# Patient Record
Sex: Male | Born: 1985
Health system: Southern US, Community
[De-identification: ages and names within clinical notes are randomized; demographics above are authoritative.]

## PROBLEM LIST (undated history)

## (undated) DIAGNOSIS — G709 Myoneural disorder, unspecified: Secondary | ICD-10-CM

## (undated) DIAGNOSIS — E119 Type 2 diabetes mellitus without complications: Secondary | ICD-10-CM

## (undated) DIAGNOSIS — D649 Anemia, unspecified: Secondary | ICD-10-CM

## (undated) DIAGNOSIS — F319 Bipolar disorder, unspecified: Secondary | ICD-10-CM

## (undated) DIAGNOSIS — K219 Gastro-esophageal reflux disease without esophagitis: Secondary | ICD-10-CM

## (undated) DIAGNOSIS — I10 Essential (primary) hypertension: Secondary | ICD-10-CM

## (undated) DIAGNOSIS — J45909 Unspecified asthma, uncomplicated: Secondary | ICD-10-CM

## (undated) DIAGNOSIS — M1990 Unspecified osteoarthritis, unspecified site: Secondary | ICD-10-CM

## (undated) DIAGNOSIS — F329 Major depressive disorder, single episode, unspecified: Secondary | ICD-10-CM

## (undated) DIAGNOSIS — G839 Paralytic syndrome, unspecified: Secondary | ICD-10-CM

## (undated) DIAGNOSIS — N183 Chronic kidney disease, stage 3 unspecified: Secondary | ICD-10-CM

## (undated) DIAGNOSIS — R011 Cardiac murmur, unspecified: Secondary | ICD-10-CM

## (undated) DIAGNOSIS — F32A Depression, unspecified: Secondary | ICD-10-CM

## (undated) HISTORY — PX: RETINAL LASER PROCEDURE: SHX2339

## (undated) HISTORY — PX: TUMOR EXCISION: SHX421

## (undated) HISTORY — DX: Anemia, unspecified: D64.9

## (undated) HISTORY — PX: INGUINAL HERNIA REPAIR: SUR1180

## (undated) HISTORY — DX: Chronic kidney disease, stage 3 unspecified: N18.30

---

## 2014-06-15 ENCOUNTER — Ambulatory Visit: Payer: No Typology Code available for payment source | Attending: Chiropractic Medicine | Admitting: Physical Therapy

## 2014-06-15 ENCOUNTER — Encounter: Payer: Self-pay | Admitting: Physical Therapy

## 2014-06-15 DIAGNOSIS — M545 Low back pain, unspecified: Secondary | ICD-10-CM

## 2014-06-15 NOTE — Therapy (Addendum)
[address] [place], [place], [address] Phone: [phone]   Fax:  [phone]  Physical Therapy Evaluation  Patient Details  Name: [name] [name] MRN: [id number] Date of Birth: 1985/10/06 Referring Provider:  Reinaldo Meeker, DC  Encounter Date: 06/15/2014    History reviewed. No pertinent past medical history.  History reviewed. No pertinent past surgical history.  There were no vitals filed for this visit.  Visit Diagnosis:  Midline low back pain without sciatica - Plan: PT plan of care cert/re-cert                               PT Short Term Goals - 06/15/14 1449    PT SHORT TERM GOAL #1   Title independent with initial HEP   Time 2   Period Weeks   Status New           PT Long Term Goals - 06/15/14 1450    PT LONG TERM GOAL #1   Title understand and demonstrate proper posture and body mechanics   Time 8   Period Weeks   Status New   PT LONG TERM GOAL #2   Title increase ROM 50%   Time 8   Period Weeks   Status New   PT LONG TERM GOAL #3   Title decrease pain 50%   Time 8   Period Weeks   Status New   PT LONG TERM GOAL #4   Title sit without difficulty   Time 8   Period Weeks   Status New            PHYSICAL THERAPY DISCHARGE SUMMARY  Visits from Start of Care:1   Plan: Patient agrees to discharge.  Patient goals were not met. Patient is being discharged due to not returning since the last visit.  ?????         Problem List There are no active problems to display for this patient.   Sumner Boast, PT 10/27/2014, 8:14 AM  [address] [place], [place], [address] Phone: [phone]   Fax:  [phone]

## 2014-06-22 ENCOUNTER — Ambulatory Visit: Payer: No Typology Code available for payment source | Attending: Chiropractic Medicine | Admitting: Physical Therapy

## 2014-06-22 DIAGNOSIS — M545 Low back pain: Secondary | ICD-10-CM | POA: Insufficient documentation

## 2015-08-30 ENCOUNTER — Emergency Department (HOSPITAL_COMMUNITY): Payer: Self-pay

## 2015-08-30 ENCOUNTER — Inpatient Hospital Stay (HOSPITAL_COMMUNITY)
Admission: EM | Admit: 2015-08-30 | Discharge: 2015-09-11 | DRG: 571 | Disposition: A | Discharge status: Home Health | Payer: Self-pay | Attending: Internal Medicine | Admitting: Internal Medicine

## 2015-08-30 ENCOUNTER — Encounter (HOSPITAL_COMMUNITY): Payer: Self-pay | Admitting: *Deleted

## 2015-08-30 DIAGNOSIS — Z22322 Carrier or suspected carrier of Methicillin resistant Staphylococcus aureus: Secondary | ICD-10-CM

## 2015-08-30 DIAGNOSIS — Z590 Homelessness: Secondary | ICD-10-CM

## 2015-08-30 DIAGNOSIS — T383X6A Underdosing of insulin and oral hypoglycemic [antidiabetic] drugs, initial encounter: Secondary | ICD-10-CM | POA: Diagnosis present

## 2015-08-30 DIAGNOSIS — L0390 Cellulitis, unspecified: Secondary | ICD-10-CM | POA: Insufficient documentation

## 2015-08-30 DIAGNOSIS — L0293 Carbuncle, unspecified: Secondary | ICD-10-CM | POA: Diagnosis present

## 2015-08-30 DIAGNOSIS — L02419 Cutaneous abscess of limb, unspecified: Secondary | ICD-10-CM

## 2015-08-30 DIAGNOSIS — X58XXXA Exposure to other specified factors, initial encounter: Secondary | ICD-10-CM | POA: Diagnosis present

## 2015-08-30 DIAGNOSIS — Z91120 Patient's intentional underdosing of medication regimen due to financial hardship: Secondary | ICD-10-CM

## 2015-08-30 DIAGNOSIS — T368X5A Adverse effect of other systemic antibiotics, initial encounter: Secondary | ICD-10-CM | POA: Diagnosis not present

## 2015-08-30 DIAGNOSIS — E669 Obesity, unspecified: Secondary | ICD-10-CM | POA: Diagnosis present

## 2015-08-30 DIAGNOSIS — J45909 Unspecified asthma, uncomplicated: Secondary | ICD-10-CM | POA: Diagnosis present

## 2015-08-30 DIAGNOSIS — M609 Myositis, unspecified: Secondary | ICD-10-CM | POA: Diagnosis present

## 2015-08-30 DIAGNOSIS — L02213 Cutaneous abscess of chest wall: Secondary | ICD-10-CM | POA: Diagnosis present

## 2015-08-30 DIAGNOSIS — L02412 Cutaneous abscess of left axilla: Secondary | ICD-10-CM | POA: Diagnosis present

## 2015-08-30 DIAGNOSIS — B957 Other staphylococcus as the cause of diseases classified elsewhere: Secondary | ICD-10-CM | POA: Diagnosis present

## 2015-08-30 DIAGNOSIS — L0291 Cutaneous abscess, unspecified: Secondary | ICD-10-CM

## 2015-08-30 DIAGNOSIS — E869 Volume depletion, unspecified: Secondary | ICD-10-CM | POA: Diagnosis present

## 2015-08-30 DIAGNOSIS — M79631 Pain in right forearm: Secondary | ICD-10-CM | POA: Diagnosis present

## 2015-08-30 DIAGNOSIS — R609 Edema, unspecified: Secondary | ICD-10-CM

## 2015-08-30 DIAGNOSIS — IMO0002 Reserved for concepts with insufficient information to code with codable children: Secondary | ICD-10-CM | POA: Diagnosis present

## 2015-08-30 DIAGNOSIS — L03113 Cellulitis of right upper limb: Secondary | ICD-10-CM | POA: Diagnosis present

## 2015-08-30 DIAGNOSIS — L02413 Cutaneous abscess of right upper limb: Secondary | ICD-10-CM | POA: Diagnosis present

## 2015-08-30 DIAGNOSIS — I10 Essential (primary) hypertension: Secondary | ICD-10-CM | POA: Diagnosis present

## 2015-08-30 DIAGNOSIS — L02414 Cutaneous abscess of left upper limb: Principal | ICD-10-CM | POA: Diagnosis present

## 2015-08-30 DIAGNOSIS — E1165 Type 2 diabetes mellitus with hyperglycemia: Secondary | ICD-10-CM | POA: Diagnosis present

## 2015-08-30 DIAGNOSIS — B9562 Methicillin resistant Staphylococcus aureus infection as the cause of diseases classified elsewhere: Secondary | ICD-10-CM | POA: Diagnosis present

## 2015-08-30 DIAGNOSIS — D6489 Other specified anemias: Secondary | ICD-10-CM | POA: Diagnosis present

## 2015-08-30 DIAGNOSIS — R197 Diarrhea, unspecified: Secondary | ICD-10-CM | POA: Diagnosis not present

## 2015-08-30 DIAGNOSIS — T465X6A Underdosing of other antihypertensive drugs, initial encounter: Secondary | ICD-10-CM | POA: Diagnosis present

## 2015-08-30 DIAGNOSIS — D649 Anemia, unspecified: Secondary | ICD-10-CM | POA: Diagnosis present

## 2015-08-30 DIAGNOSIS — Z6835 Body mass index (BMI) 35.0-35.9, adult: Secondary | ICD-10-CM

## 2015-08-30 DIAGNOSIS — N179 Acute kidney failure, unspecified: Secondary | ICD-10-CM

## 2015-08-30 DIAGNOSIS — S5011XA Contusion of right forearm, initial encounter: Secondary | ICD-10-CM | POA: Diagnosis present

## 2015-08-30 DIAGNOSIS — M109 Gout, unspecified: Secondary | ICD-10-CM | POA: Diagnosis present

## 2015-08-30 DIAGNOSIS — E876 Hypokalemia: Secondary | ICD-10-CM | POA: Diagnosis present

## 2015-08-30 DIAGNOSIS — L0292 Furuncle, unspecified: Secondary | ICD-10-CM | POA: Diagnosis present

## 2015-08-30 DIAGNOSIS — I152 Hypertension secondary to endocrine disorders: Secondary | ICD-10-CM | POA: Diagnosis present

## 2015-08-30 HISTORY — DX: Essential (primary) hypertension: I10

## 2015-08-30 HISTORY — DX: Unspecified asthma, uncomplicated: J45.909

## 2015-08-30 LAB — COMPREHENSIVE METABOLIC PANEL
ALT: 18 U/L (ref 17–63)
ANION GAP: 9 (ref 5–15)
AST: 12 U/L — ABNORMAL LOW (ref 15–41)
Albumin: 3.8 g/dL (ref 3.5–5.0)
Alkaline Phosphatase: 95 U/L (ref 38–126)
BUN: 9 mg/dL (ref 6–20)
CALCIUM: 9.3 mg/dL (ref 8.9–10.3)
CHLORIDE: 93 mmol/L — AB (ref 101–111)
CO2: 29 mmol/L (ref 22–32)
Creatinine, Ser: 0.81 mg/dL (ref 0.61–1.24)
GFR calc non Af Amer: >60 mL/min (ref >60)
Glucose, Bld: 420 mg/dL — ABNORMAL HIGH (ref 65–99)
Potassium: 3.9 mmol/L (ref 3.5–5.1)
SODIUM: 131 mmol/L — AB (ref 135–145)
Total Bilirubin: 0.7 mg/dL (ref 0.3–1.2)
Total Protein: 7.7 g/dL (ref 6.5–8.1)

## 2015-08-30 LAB — CBC WITH DIFFERENTIAL/PLATELET
Basophils Absolute: 0 10*3/uL (ref 0.0–0.1)
Basophils Relative: 0 %
EOS ABS: 0.1 10*3/uL (ref 0.0–0.7)
EOS PCT: 1 %
HCT: 40.5 % (ref 39.0–52.0)
Hemoglobin: 14.8 g/dL (ref 13.0–17.0)
LYMPHS ABS: 2.2 10*3/uL (ref 0.7–4.0)
Lymphocytes Relative: 13 %
MCH: 30 pg (ref 26.0–34.0)
MCHC: 36.5 g/dL — AB (ref 30.0–36.0)
MCV: 82 fL (ref 78.0–100.0)
MONO ABS: 1.2 10*3/uL — AB (ref 0.1–1.0)
MONOS PCT: 7 %
Neutro Abs: 13.4 10*3/uL — ABNORMAL HIGH (ref 1.7–7.7)
Neutrophils Relative %: 79 %
PLATELETS: 273 10*3/uL (ref 150–400)
RBC: 4.94 MIL/uL (ref 4.22–5.81)
RDW: 12.2 % (ref 11.5–15.5)
WBC: 16.9 10*3/uL — AB (ref 4.0–10.5)

## 2015-08-30 LAB — I-STAT CG4 LACTIC ACID, ED: LACTIC ACID, VENOUS: 1.37 mmol/L (ref 0.5–2.0)

## 2015-08-30 LAB — CBG MONITORING, ED: Glucose-Capillary: 423 mg/dL — ABNORMAL HIGH (ref 65–99)

## 2015-08-30 MED ORDER — SODIUM CHLORIDE 0.9 % IV BOLUS (SEPSIS)
1000.0000 mL | Freq: Once | INTRAVENOUS | Status: AC
  Administered 2015-08-30: 1000 mL via INTRAVENOUS

## 2015-08-30 NOTE — ED Notes (Signed)
Per EMS pt coming from home with c/o right hand and wrist pain and swelling as well as left shoulder abscess, per EMS pt also reports not being able to afford his diabetic medications (CBG 367)

## 2015-08-30 NOTE — ED Provider Notes (Signed)
CSN: [id number]     Arrival date & time 08/30/15  1820 History   First MD Initiated Contact with Patient 08/30/15 2226     Chief Complaint  Patient presents with  . Wrist Pain  . Diabetes  . Abscess     (Consider location/radiation/quality/duration/timing/severity/associated sxs/prior Treatment) HPI   [name] [name] is a(n) 30 y.o. male who presents to the ED for cc of  Abscess. He has a pmh of diabetes. The patient states that he used to take metformin, but has been off of his medicaiton for over 1 year. He states that he has had ove 100 pound weight loss. He c/o abscess in the left axilla. He has multiple recurrent skin abscesses that he usually pops at home. He states that he popped the abscess under his left axilla 2 times in the past week but it has gotten extremely large and painful. He has associated subjective fevers, weakness, chills, and rigors. He also c/o pain in the right wrist. He punched a wall last week and denies sig. Pain at that time, however he developed redness, heat swelling, and severe pain in the wrist and forearm. He is unable to move the right wrist due to pain. He denies any breaks in the skin of the hand, although there are multiple healing abrasions and cuts.  He has chronic, watery diarrhea which is not new. . Denies DOE, SOB, chest tightness or pressure, radiation to left arm, jaw or back, or diaphoresis. Denies dysuria, flank pain, suprapubic pain,, or hematuria.Denies abdominal pain, nausea, vomiting.  Past Medical History  Diagnosis Date  . Diabetes mellitus without complication ([place])   . Asthma   . Hypertension    History reviewed. No pertinent past surgical history. No family history on file. Social History  Substance Use Topics  . Smoking status: Never Smoker   . Smokeless tobacco: None  . Alcohol Use: No    Review of Systems  Ten systems reviewed and are negative for acute change, except as noted in the HPI.    Allergies  Review of patient's  allergies indicates no known allergies.  Home Medications   Prior to Admission medications   Not on File   BP 123/84 mmHg  Pulse 102  Temp(Src) 99.9 F (37.7 C) (Oral)  Resp 18  SpO2 97% Physical Exam  Constitutional: He appears toxic.  Musculoskeletal:       Arms: Skin:  Multiple scars, infections and scabs. The left axilla has a grapefruit size abscess with, tense fluctuance extending from the region of the lateral border of the scapula in to the axillary cavity with large reactive adenopathy. He is able to move the shoulder and arm easily.  Nursing note and vitals reviewed.   ED Course  Procedures (including critical care time) Labs Review Labs Reviewed  CBC WITH DIFFERENTIAL/PLATELET - Abnormal; Notable for the following:    WBC 16.9 (*)    MCHC 36.5 (*)    Neutro Abs 13.4 (*)    Monocytes Absolute 1.2 (*)    All other components within normal limits  COMPREHENSIVE METABOLIC PANEL - Abnormal; Notable for the following:    Sodium 131 (*)    Chloride 93 (*)    Glucose, Bld 420 (*)    AST 12 (*)    All other components within normal limits  CBG MONITORING, ED - Abnormal; Notable for the following:    Glucose-Capillary 423 (*)    All other components within normal limits  CULTURE, BLOOD (ROUTINE X 2)  CULTURE, BLOOD (ROUTINE X 2)  I-STAT CG4 LACTIC ACID, ED  I-STAT CG4 LACTIC ACID, ED    Imaging Review No results found. I have personally reviewed and evaluated these images and lab results as part of my medical decision-making.   EKG Interpretation None      MDM   Final diagnoses:  None    Patient with impressive Left axillary abscess with large reactive adenopathy and superficial cellulitis. Hyperglycemic. Will need Operative consult for her abscess. Started on vanc and zosyn. Cultures taken. Dr. Amedeo Plenty has consulted on the patient's wrist and does not feel he needs a joint tap.  Patient will get wrist splint.  I have given over care to [place].  Patient's chest CT pending.  t    Margarita Mail, PA-C 08/31/15 0150  April Palumbo, MD 08/31/15 [phone]

## 2015-08-31 ENCOUNTER — Observation Stay (HOSPITAL_COMMUNITY): Payer: Self-pay | Admitting: Certified Registered"

## 2015-08-31 ENCOUNTER — Emergency Department (HOSPITAL_COMMUNITY): Payer: Self-pay

## 2015-08-31 ENCOUNTER — Encounter (HOSPITAL_COMMUNITY)
Admission: EM | Disposition: A | Discharge status: Home Health | Payer: Self-pay | Source: Home / Self Care | Attending: Internal Medicine

## 2015-08-31 ENCOUNTER — Encounter (HOSPITAL_COMMUNITY): Payer: Self-pay

## 2015-08-31 DIAGNOSIS — Z9114 Patient's other noncompliance with medication regimen: Secondary | ICD-10-CM

## 2015-08-31 DIAGNOSIS — IMO0002 Reserved for concepts with insufficient information to code with codable children: Secondary | ICD-10-CM | POA: Diagnosis present

## 2015-08-31 DIAGNOSIS — E1165 Type 2 diabetes mellitus with hyperglycemia: Secondary | ICD-10-CM

## 2015-08-31 DIAGNOSIS — L02414 Cutaneous abscess of left upper limb: Secondary | ICD-10-CM | POA: Diagnosis present

## 2015-08-31 DIAGNOSIS — L02413 Cutaneous abscess of right upper limb: Secondary | ICD-10-CM | POA: Diagnosis present

## 2015-08-31 DIAGNOSIS — E869 Volume depletion, unspecified: Secondary | ICD-10-CM

## 2015-08-31 HISTORY — PX: IRRIGATION AND DEBRIDEMENT ABSCESS: SHX5252

## 2015-08-31 LAB — GLUCOSE, CAPILLARY
Glucose-Capillary: 330 mg/dL — ABNORMAL HIGH (ref 65–99)
Glucose-Capillary: 282 mg/dL — ABNORMAL HIGH (ref 65–99)
Glucose-Capillary: 232 mg/dL — ABNORMAL HIGH (ref 65–99)
Glucose-Capillary: 411 mg/dL — ABNORMAL HIGH (ref 65–99)
Glucose-Capillary: 361 mg/dL — ABNORMAL HIGH (ref 65–99)
Glucose-Capillary: 216 mg/dL — ABNORMAL HIGH (ref 65–99)

## 2015-08-31 LAB — CBC
HCT: 36.6 % — ABNORMAL LOW (ref 39.0–52.0)
Hemoglobin: 13 g/dL (ref 13.0–17.0)
MCH: 29.6 pg (ref 26.0–34.0)
MCHC: 35.5 g/dL (ref 30.0–36.0)
MCV: 83.4 fL (ref 78.0–100.0)
Platelets: 257 10*3/uL (ref 150–400)
RBC: 4.39 MIL/uL (ref 4.22–5.81)
RDW: 12.2 % (ref 11.5–15.5)
WBC: 15.8 10*3/uL — ABNORMAL HIGH (ref 4.0–10.5)

## 2015-08-31 LAB — CBG MONITORING, ED
GLUCOSE-CAPILLARY: 313 mg/dL — AB (ref 65–99)
Glucose-Capillary: 401 mg/dL — ABNORMAL HIGH (ref 65–99)
Glucose-Capillary: 355 mg/dL — ABNORMAL HIGH (ref 65–99)

## 2015-08-31 LAB — CREATININE, SERUM
Creatinine, Ser: 0.65 mg/dL (ref 0.61–1.24)
GFR calc Af Amer: >60 mL/min (ref >60)
GFR calc non Af Amer: >60 mL/min (ref >60)

## 2015-08-31 LAB — SURGICAL PCR SCREEN
MRSA, PCR: NEGATIVE
Staphylococcus aureus: NEGATIVE

## 2015-08-31 LAB — TSH: TSH: 0.901 u[IU]/mL (ref 0.350–4.500)

## 2015-08-31 LAB — MAGNESIUM: Magnesium: 1.9 mg/dL (ref 1.7–2.4)

## 2015-08-31 LAB — I-STAT CG4 LACTIC ACID, ED: Lactic Acid, Venous: 1.83 mmol/L (ref 0.5–2.0)

## 2015-08-31 LAB — PHOSPHORUS: Phosphorus: 3.6 mg/dL (ref 2.5–4.6)

## 2015-08-31 SURGERY — IRRIGATION AND DEBRIDEMENT ABSCESS
Anesthesia: General | Laterality: Left

## 2015-08-31 MED ORDER — VANCOMYCIN HCL IN DEXTROSE 1-5 GM/200ML-% IV SOLN
1000.0000 mg | Freq: Three times a day (TID) | INTRAVENOUS | Status: DC
  Administered 2015-08-31 – 2015-09-01 (×4): 1000 mg via INTRAVENOUS
  Filled 2015-08-31 (×5): qty 200

## 2015-08-31 MED ORDER — PHENYLEPHRINE 40 MCG/ML (10ML) SYRINGE FOR IV PUSH (FOR BLOOD PRESSURE SUPPORT)
PREFILLED_SYRINGE | INTRAVENOUS | Status: AC
  Filled 2015-08-31: qty 10

## 2015-08-31 MED ORDER — BACITRACIN-NEOMYCIN-POLYMYXIN 400-5-5000 EX OINT
TOPICAL_OINTMENT | CUTANEOUS | Status: AC
  Administered 2015-08-31: 1
  Filled 2015-08-31: qty 1

## 2015-08-31 MED ORDER — HYDROCODONE-ACETAMINOPHEN 7.5-325 MG PO TABS: 1.0000 | ORAL_TABLET | Freq: Once | ORAL | Status: DC | PRN

## 2015-08-31 MED ORDER — KETOROLAC TROMETHAMINE 30 MG/ML IJ SOLN: 30.0000 mg | Freq: Once | INTRAMUSCULAR | Status: DC | PRN

## 2015-08-31 MED ORDER — VANCOMYCIN HCL IN DEXTROSE 1-5 GM/200ML-% IV SOLN
1000.0000 mg | Freq: Once | INTRAVENOUS | Status: AC
  Administered 2015-08-31: 1000 mg via INTRAVENOUS
  Filled 2015-08-31: qty 200

## 2015-08-31 MED ORDER — PROPOFOL 10 MG/ML IV BOLUS
INTRAVENOUS | Status: DC | PRN
  Administered 2015-08-31: 150 mg via INTRAVENOUS

## 2015-08-31 MED ORDER — INSULIN ASPART 100 UNIT/ML ~~LOC~~ SOLN
6.0000 [IU] | Freq: Once | SUBCUTANEOUS | Status: AC
  Administered 2015-08-31: 6 [IU] via SUBCUTANEOUS

## 2015-08-31 MED ORDER — BUPIVACAINE-EPINEPHRINE 0.25% -1:200000 IJ SOLN
INTRAMUSCULAR | Status: AC
  Filled 2015-08-31: qty 1

## 2015-08-31 MED ORDER — SUCCINYLCHOLINE CHLORIDE 20 MG/ML IJ SOLN
INTRAMUSCULAR | Status: DC | PRN
  Administered 2015-08-31: 100 mg via INTRAVENOUS

## 2015-08-31 MED ORDER — HYDROMORPHONE HCL 1 MG/ML IJ SOLN: 1.0000 mg | INTRAMUSCULAR | Status: DC | PRN

## 2015-08-31 MED ORDER — ACETAMINOPHEN 650 MG RE SUPP: 650.0000 mg | Freq: Four times a day (QID) | RECTAL | Status: DC | PRN

## 2015-08-31 MED ORDER — MORPHINE SULFATE (PF) 2 MG/ML IV SOLN
1.0000 mg | INTRAVENOUS | Status: DC | PRN
  Administered 2015-08-31: 1 mg via INTRAVENOUS
  Administered 2015-09-03 – 2015-09-06 (×2): 2 mg via INTRAVENOUS
  Filled 2015-08-31 (×3): qty 1

## 2015-08-31 MED ORDER — IOPAMIDOL (ISOVUE-300) INJECTION 61%
75.0000 mL | Freq: Once | INTRAVENOUS | Status: AC | PRN
  Administered 2015-08-31: 75 mL via INTRAVENOUS

## 2015-08-31 MED ORDER — HYDROMORPHONE HCL 1 MG/ML IJ SOLN: 0.2500 mg | INTRAMUSCULAR | Status: DC | PRN

## 2015-08-31 MED ORDER — MIDAZOLAM HCL 2 MG/2ML IJ SOLN
INTRAMUSCULAR | Status: DC | PRN
  Administered 2015-08-31 (×2): 1 mg via INTRAVENOUS

## 2015-08-31 MED ORDER — OXYCODONE HCL 5 MG PO TABS
5.0000 mg | ORAL_TABLET | ORAL | Status: DC | PRN
  Administered 2015-09-03: 10 mg via ORAL
  Filled 2015-08-31: qty 2

## 2015-08-31 MED ORDER — ENOXAPARIN SODIUM 60 MG/0.6ML ~~LOC~~ SOLN
50.0000 mg | SUBCUTANEOUS | Status: DC
  Administered 2015-09-01 – 2015-09-02 (×2): 50 mg via SUBCUTANEOUS
  Filled 2015-08-31 (×4): qty 0.6

## 2015-08-31 MED ORDER — ONDANSETRON HCL 4 MG/2ML IJ SOLN
INTRAMUSCULAR | Status: DC | PRN
  Administered 2015-08-31: 4 mg via INTRAVENOUS

## 2015-08-31 MED ORDER — PIPERACILLIN-TAZOBACTAM 3.375 G IVPB 30 MIN
3.3750 g | Freq: Once | INTRAVENOUS | Status: AC
  Administered 2015-08-31: 3.375 g via INTRAVENOUS
  Filled 2015-08-31: qty 50

## 2015-08-31 MED ORDER — INSULIN ASPART PROT & ASPART (70-30 MIX) 100 UNIT/ML ~~LOC~~ SUSP: 6.0000 [IU] | Freq: Once | SUBCUTANEOUS | Status: DC

## 2015-08-31 MED ORDER — PROMETHAZINE HCL 25 MG/ML IJ SOLN: 6.2500 mg | INTRAMUSCULAR | Status: DC | PRN

## 2015-08-31 MED ORDER — FENTANYL CITRATE (PF) 250 MCG/5ML IJ SOLN
INTRAMUSCULAR | Status: DC | PRN
  Administered 2015-08-31 (×2): 50 ug via INTRAVENOUS

## 2015-08-31 MED ORDER — PIPERACILLIN-TAZOBACTAM 3.375 G IVPB
INTRAVENOUS | Status: AC
  Filled 2015-08-31: qty 50

## 2015-08-31 MED ORDER — MIDAZOLAM HCL 2 MG/2ML IJ SOLN
INTRAMUSCULAR | Status: AC
  Filled 2015-08-31: qty 2

## 2015-08-31 MED ORDER — INSULIN ASPART 100 UNIT/ML ~~LOC~~ SOLN
0.0000 [IU] | Freq: Every day | SUBCUTANEOUS | Status: DC
  Administered 2015-08-31 – 2015-09-02 (×3): 2 [IU] via SUBCUTANEOUS

## 2015-08-31 MED ORDER — LIDOCAINE HCL (CARDIAC) 20 MG/ML IV SOLN
INTRAVENOUS | Status: AC
  Filled 2015-08-31: qty 5

## 2015-08-31 MED ORDER — PIPERACILLIN-TAZOBACTAM 3.375 G IVPB
3.3750 g | Freq: Three times a day (TID) | INTRAVENOUS | Status: DC
  Administered 2015-08-31: 3.375 mg via INTRAVENOUS
  Administered 2015-08-31 – 2015-09-02 (×7): 3.375 g via INTRAVENOUS
  Filled 2015-08-31 (×9): qty 50

## 2015-08-31 MED ORDER — INSULIN ASPART 100 UNIT/ML ~~LOC~~ SOLN
0.0000 [IU] | Freq: Three times a day (TID) | SUBCUTANEOUS | Status: DC
  Administered 2015-08-31: 15 [IU] via SUBCUTANEOUS
  Administered 2015-09-01: 11 [IU] via SUBCUTANEOUS
  Administered 2015-09-01: 8 [IU] via SUBCUTANEOUS
  Administered 2015-09-01 – 2015-09-02 (×3): 15 [IU] via SUBCUTANEOUS

## 2015-08-31 MED ORDER — ONDANSETRON HCL 4 MG/2ML IJ SOLN
INTRAMUSCULAR | Status: AC
  Filled 2015-08-31: qty 2

## 2015-08-31 MED ORDER — ONDANSETRON HCL 4 MG/2ML IJ SOLN: 4.0000 mg | Freq: Four times a day (QID) | INTRAMUSCULAR | Status: DC | PRN

## 2015-08-31 MED ORDER — ACETAMINOPHEN 325 MG PO TABS: 650.0000 mg | ORAL_TABLET | Freq: Four times a day (QID) | ORAL | Status: DC | PRN

## 2015-08-31 MED ORDER — SUGAMMADEX SODIUM 200 MG/2ML IV SOLN
INTRAVENOUS | Status: AC
  Filled 2015-08-31: qty 2

## 2015-08-31 MED ORDER — INSULIN ASPART 100 UNIT/ML ~~LOC~~ SOLN: 0.0000 [IU] | Freq: Three times a day (TID) | SUBCUTANEOUS | Status: DC

## 2015-08-31 MED ORDER — SODIUM CHLORIDE 0.9 % IV BOLUS (SEPSIS)
1000.0000 mL | Freq: Once | INTRAVENOUS | Status: AC
  Administered 2015-08-31: 1000 mL via INTRAVENOUS

## 2015-08-31 MED ORDER — PHENYLEPHRINE HCL 10 MG/ML IJ SOLN
INTRAMUSCULAR | Status: DC | PRN
  Administered 2015-08-31: 80 ug via INTRAVENOUS
  Administered 2015-08-31: 40 ug via INTRAVENOUS

## 2015-08-31 MED ORDER — FENTANYL CITRATE (PF) 250 MCG/5ML IJ SOLN
INTRAMUSCULAR | Status: AC
  Filled 2015-08-31: qty 5

## 2015-08-31 MED ORDER — SODIUM CHLORIDE 0.9 % IV SOLN
INTRAVENOUS | Status: DC
  Administered 2015-08-31 – 2015-09-02 (×3): via INTRAVENOUS
  Administered 2015-09-03 – 2015-09-04 (×2): 100 mL/h via INTRAVENOUS
  Administered 2015-09-04: 12:00:00 via INTRAVENOUS
  Administered 2015-09-06: 1000 mL via INTRAVENOUS
  Administered 2015-09-06 – 2015-09-11 (×4): via INTRAVENOUS

## 2015-08-31 MED ORDER — ONDANSETRON 4 MG PO TBDP: 4.0000 mg | ORAL_TABLET | Freq: Four times a day (QID) | ORAL | Status: DC | PRN

## 2015-08-31 MED ORDER — LIDOCAINE HCL (CARDIAC) 20 MG/ML IV SOLN
INTRAVENOUS | Status: DC | PRN
  Administered 2015-08-31: 60 mg via INTRAVENOUS

## 2015-08-31 MED ORDER — INSULIN GLARGINE 100 UNIT/ML ~~LOC~~ SOLN
10.0000 [IU] | Freq: Two times a day (BID) | SUBCUTANEOUS | Status: DC
  Administered 2015-08-31 – 2015-09-01 (×4): 10 [IU] via SUBCUTANEOUS
  Filled 2015-08-31 (×5): qty 0.1

## 2015-08-31 MED ORDER — HYDROCODONE-ACETAMINOPHEN 5-325 MG PO TABS
1.0000 | ORAL_TABLET | ORAL | Status: DC | PRN
  Administered 2015-08-31 – 2015-09-08 (×20): 2 via ORAL
  Administered 2015-09-08: 1 via ORAL
  Administered 2015-09-08 – 2015-09-09 (×2): 2 via ORAL
  Filled 2015-08-31 (×12): qty 2
  Filled 2015-08-31: qty 1
  Filled 2015-08-31 (×11): qty 2

## 2015-08-31 MED ORDER — LACTATED RINGERS IV SOLN
INTRAVENOUS | Status: DC | PRN
  Administered 2015-08-31: 12:00:00 via INTRAVENOUS

## 2015-08-31 MED ORDER — PROPOFOL 10 MG/ML IV BOLUS
INTRAVENOUS | Status: AC
  Filled 2015-08-31: qty 20

## 2015-08-31 SURGICAL SUPPLY — 34 items
BENZOIN TINCTURE PRP APPL 2/3 (GAUZE/BANDAGES/DRESSINGS) ×3 IMPLANT
BLADE HEX COATED 2.75 (ELECTRODE) ×3 IMPLANT
BLADE SURG 15 STRL LF DISP TIS (BLADE) IMPLANT
BLADE SURG 15 STRL SS (BLADE)
BLADE SURG SZ10 CARB STEEL (BLADE) ×6 IMPLANT
BNDG GAUZE ELAST 4 BULKY (GAUZE/BANDAGES/DRESSINGS) ×3 IMPLANT
CLOSURE WOUND 1/2 X4 (GAUZE/BANDAGES/DRESSINGS) ×1
CONNECTOR 5 IN 1 STRAIGHT STRL (MISCELLANEOUS) ×3 IMPLANT
COVER SURGICAL LIGHT HANDLE (MISCELLANEOUS) ×3 IMPLANT
DECANTER SPIKE VIAL GLASS SM (MISCELLANEOUS) ×3 IMPLANT
DRAIN PENROSE 18X1/2 LTX STRL (DRAIN) ×3 IMPLANT
DRAPE LAPAROTOMY TRNSV 102X78 (DRAPE) ×3 IMPLANT
DRSG PAD ABDOMINAL 8X10 ST (GAUZE/BANDAGES/DRESSINGS) ×3 IMPLANT
ELECT PENCIL ROCKER SW 15FT (MISCELLANEOUS) ×3 IMPLANT
ELECT REM PT RETURN 9FT ADLT (ELECTROSURGICAL) ×3
ELECTRODE REM PT RTRN 9FT ADLT (ELECTROSURGICAL) ×1 IMPLANT
GAUZE SPONGE 4X4 12PLY STRL (GAUZE/BANDAGES/DRESSINGS) ×3 IMPLANT
GLOVE BIOGEL PI IND STRL 7.0 (GLOVE) ×1 IMPLANT
GLOVE BIOGEL PI INDICATOR 7.0 (GLOVE) ×2
GLOVE SURG SIGNA 7.5 PF LTX (GLOVE) ×3 IMPLANT
GOWN SPEC L4 XLG W/TWL (GOWN DISPOSABLE) ×3 IMPLANT
GOWN STRL REUS W/ TWL XL LVL3 (GOWN DISPOSABLE) ×3 IMPLANT
GOWN STRL REUS W/TWL LRG LVL3 (GOWN DISPOSABLE) ×3 IMPLANT
GOWN STRL REUS W/TWL XL LVL3 (GOWN DISPOSABLE) ×6
KIT BASIN OR (CUSTOM PROCEDURE TRAY) ×3 IMPLANT
NEEDLE HYPO 25X1 1.5 SAFETY (NEEDLE) ×3 IMPLANT
NS IRRIG 1000ML POUR BTL (IV SOLUTION) ×3 IMPLANT
PACK BASIC VI WITH GOWN DISP (CUSTOM PROCEDURE TRAY) ×3 IMPLANT
SPONGE LAP 4X18 X RAY DECT (DISPOSABLE) ×6 IMPLANT
STRIP CLOSURE SKIN 1/2X4 (GAUZE/BANDAGES/DRESSINGS) ×2 IMPLANT
SYR BULB IRRIGATION 50ML (SYRINGE) ×3 IMPLANT
SYR CONTROL 10ML LL (SYRINGE) ×3 IMPLANT
TOWEL OR 17X26 10 PK STRL BLUE (TOWEL DISPOSABLE) ×3 IMPLANT
YANKAUER SUCT BULB TIP 10FT TU (MISCELLANEOUS) ×3 IMPLANT

## 2015-08-31 NOTE — Op Note (Signed)
08/30/2015 - 08/31/2015  12:54 PM  PATIENT:  [name] [name], 30 y.o., male, MRN: [id number]  PREOP DIAGNOSIS:  abcess left shoulder   POSTOP DIAGNOSIS:   abcess left shoulder (6 x 12 cm)  [photo at end of note]  PROCEDURE:   Procedure(s): IRRIGATION AND DEBRIDEMENT ABSCESS LEFT SHOULDER  SURGEON:   Alphonsa Overall, M.D.  ASSISTANT:   None  ANESTHESIA:   general  Anesthesiologist: Suzette Battiest, MD CRNA: Cynda Familia, CRNA  General  EBL:  100  ml  BLOOD ADMINISTERED: none  DRAINS: the wound was packed with curlex gauze  LOCAL MEDICATIONS USED:   None  SPECIMEN:   Cultures obtained  COUNTS CORRECT:  YES  INDICATIONS FOR PROCEDURE:  [name] [name] is a 30 y.o. (DOB: 01-Dec-1985) white  male whose primary care physician is No primary care provider on file. and comes for I&D of left shoulder abscess.    He does not see physicians and has DM which is uncontrolled.   The indications and risks of the surgery were explained to the patient.  The risks include, but are not limited to, infection, bleeding, and nerve injury.  PROCEDURE: The patient was taken to room #6 at [facility].  He was placed in a right lateral decubitus position with his left shoulder up.  A time out was held and the surgical checklist was run.  I made an incision into a 6 cm x 12 cm abscess.  Cultures were obtained.  The wound was irrigated with 600 cc of saline and packed with saline Curlex.  The wound was sterilely dressed.  Sponge and needle count were correct at the end of the case.  He was transferred to the recovery room in good condition.  He is admitted for wound care and uncontrolled DM.   Left shoulder abscess post drainage.   Alphonsa Overall, MD, [facility] Surgery Pager: [phone] Office phone:  [phone]

## 2015-08-31 NOTE — Progress Notes (Signed)
Seen earlier by Dr. Harlow Asa.  Has left shoulder abscess which will need I&D.  Has poorly controlled diabetes (he is actually doing no control) - he basically has not medical tx because of lack of funds.  His wife, [name], is at the bedside.  To the OR later today for I&D.  I discussed indications and risks of surgery.  Alphonsa Overall, MD, [facility] Surgery Pager: [phone] Office phone:  [phone]

## 2015-08-31 NOTE — ED Notes (Signed)
Dr. Marthenia Rolling Hospitalist cleared pt for surgery.

## 2015-08-31 NOTE — Consult Note (Signed)
Medical Consultation   [name] [name]  [id number]  DOB: 07/14/1985  DOA: 08/30/2015  PCP: No primary care provider on file.   Outpatient Specialists:   Requesting physician: Dr. Lucia Gaskins (Surgery)  Reason for consultation: Medical Optimization and Blood sugar control   History of Present Illness: [name] [name] is an 30 y.o. male with history of DM, HTN and asthma. Patient tells me that he has been a diabetic since his early 20's, and was on oral diabetic medication at a point. Patient has not been taking medication for his insulin or hypertension due to financial challenges according to the patient. Patient started developing boils about 3 weeks ago, and has had boils on 3 different occasions that he has self managed. Last boil has been persistent and has gotten significantly larger necessitating patient's visit to the ER. On presentation to the hospital, the blood sugar was found to be greater than 400, but BP is controlled. Patient also looks volume depleted. No fever or chills, no headache, no neck pain, no chest pain, no GI symptoms and no urinary symptoms. CBC reveals leukocytosis. CT scan reveal abscess and reactive lymphnode left axilla. Patient has been admitted by the Surgery team and I and D is planned. Patient has been fairly active, without limitations. Patient denied prior cardiac illness. As mentioned above, no chest pain and no shortness of breath with activity.   Review of Systems: As in HPI. 12 systems were reviewed. ROS As per HPI otherwise 10 point review of systems negative.    Past Medical History: Past Medical History  Diagnosis Date  . Diabetes mellitus without complication ([place])   . Asthma   . Hypertension     Past Surgical History: History reviewed. No pertinent past surgical history.   Allergies:  No Known Allergies   Social History:  reports that he has never smoked. He does not have any smokeless tobacco history on file. He reports that  he does not drink alcohol or use illicit drugs.   Family History: History reviewed. No pertinent family history.   Physical Exam: Filed Vitals:   08/30/15 2307 08/31/15 0129 08/31/15 0452 08/31/15 0722  BP: 115/84  128/88 141/92  Pulse: 94  94 97  Temp:      TempSrc:      Resp: 18  19 19   Height:  5\' 7"  (1.702 m)    Weight:  102.059 kg (225 lb)    SpO2: 99%  97% 100%    Constitutional: Obese. Alert and awake, oriented x 3. Not in any acute distress. Eyes: No pallor or jaundice.  ENMT: Dry buccal Mucosa.   Neck: Neck is supple. No JVD.   CVS: S1-S2. Respiratory:  clear to auscultation bilaterally, no wheezing,. Abdomen: Obese, soft and non tender.           Neuro: Awake and alert. Patient moves all limbs. Skin: Indurated area (abscess) left upper back (Close to posterior aspect of left shoulder joint).  Data reviewed:  I have personally reviewed following labs. Labs:  CBC:  Recent Labs Lab 08/30/15 2307  WBC 16.9*  NEUTROABS 13.4*  HGB 14.8  HCT 40.5  MCV 82.0  PLT 123456    Basic Metabolic Panel:  Recent Labs Lab 08/30/15 2307  NA 131*  K 3.9  CL 93*  CO2 29  GLUCOSE 420*  BUN 9  CREATININE 0.81  CALCIUM 9.3   GFR Estimated Creatinine Clearance: 153.2 mL/min (  by C-G formula based on Cr of 0.81). Liver Function Tests:  Recent Labs Lab 08/30/15 2307  AST 12*  ALT 18  ALKPHOS 95  BILITOT 0.7  PROT 7.7  ALBUMIN 3.8   CBG:  Recent Labs Lab 08/30/15 1830 08/31/15 0230 08/31/15 0405  GLUCAP 423* 401* 355*   Inpatient Medications:   Scheduled Meds: . enoxaparin (LOVENOX) injection  40 mg Subcutaneous Q24H  . insulin aspart  0-5 Units Subcutaneous QHS  . insulin aspart  0-9 Units Subcutaneous TID WC  . insulin aspart protamine- aspart  6 Units Subcutaneous Once  . insulin glargine  10 Units Subcutaneous BID   Continuous Infusions: . sodium chloride 100 mL/hr at 08/31/15 0534  . piperacillin-tazobactam (ZOSYN)  IV    . vancomycin        Radiological Exams on Admission: Dg Wrist Complete Right  08/31/2015  CLINICAL DATA:  Pain and swelling of the right wrist for several days after injury. EXAM: RIGHT WRIST - COMPLETE 3+ VIEW COMPARISON:  None. FINDINGS: Old fracture deformity of the right fifth metacarpal bone. No evidence of acute fracture or dislocation in the right wrist. No focal bone lesion or bone destruction. Bone cortex appears intact. Soft tissues are unremarkable. IMPRESSION: No acute bony abnormalities. Electronically Signed   By: Lucienne Capers M.D.   On: 08/31/2015 00:13   Ct Chest W Contrast  08/31/2015  CLINICAL DATA:  Left axillary abscess going into the left back. Elevated white cells. History of hypertension, asthma, and diabetes. EXAM: CT CHEST WITH CONTRAST TECHNIQUE: Multidetector CT imaging of the chest was performed during intravenous contrast administration. CONTRAST:  75 mL Isovue-300 COMPARISON:  None. FINDINGS: There is a loculated fluid collection in the subcutaneous fat posterior to the left shoulder measuring about 5.9 x 8.4 x 7.9 cm. Peripheral enhancement. Appearance is nonspecific but would be compatible with history of abscess. Mild stranding in the surrounding fatty tissues. Lesion extends from the skin to the muscle layer but does not appear to involve muscular tissues. Enlarged lymph nodes in the left axilla are probably reactive. Normal heart size. Normal caliber thoracic aorta. Esophagus is decompressed. No significant lymphadenopathy in the mediastinum. Mild dependent changes in the lung bases. No focal airspace disease or consolidation. No pleural effusions. No pneumothorax. Airways are patent. Included portions of the upper abdominal organs are grossly unremarkable. No destructive bone lesions. IMPRESSION: Loculated subcutaneous fluid collection posterior to the left shoulder consistent with history of abscess. Enlarged lymph nodes in the left axilla are probably reactive or inflammatory. No  evidence of active pulmonary disease. Electronically Signed   By: Lucienne Capers M.D.   On: 08/31/2015 01:56   Dg Hand Complete Right  08/31/2015  CLINICAL DATA:  Hit a wall 1 week ago. Ulnar-sided hand pain. Redness and swelling. EXAM: RIGHT HAND - COMPLETE 3+ VIEW COMPARISON:  RIGHT wrist radiograph August 30, 2015 FINDINGS: There is no evidence of fracture or dislocation. There is no evidence of arthropathy or other focal bone abnormality. Soft tissues are unremarkable. IMPRESSION: Negative. Electronically Signed   By: Elon Alas M.D.   On: 08/31/2015 01:14    Impression/Recommendations Principal Problem:   Abscess of left shoulder Active Problems:   Uncontrolled diabetes mellitus ([place])   Abscess  - Will continue to control patient's blood sugar. Blood sugar is down to the 300 range - [place] Lantus insulin - SSI insulin coverage. - Check HbA1c. - Case management consult - Continue hydration.  Patient is medically optimized from cardiac point.  The blood sugar control is improving.  Thank you for this consultation.  Our [facility] hospitalist team will follow the patient with you.   Time Spent: 50 Minutes.  Dana Allan, MD  [facility] Pager #: [phone] 7PM-7AM contact night coverage as above

## 2015-08-31 NOTE — Progress Notes (Signed)
Inpatient Diabetes Program Recommendations  AACE/ADA: New Consensus Statement on Inpatient Glycemic Control (2015)  Target Ranges:  Prepandial:   less than 140 mg/dL      Peak postprandial:   less than 180 mg/dL (1-2 hours)      Critically ill patients:  140 - 180 mg/dL   Lab Results  Component Value Date   GLUCAP 313* 08/31/2015    Review of Glycemic Control  Diabetes history: Type 2 dm  Outpatient Diabetes medications:Previously on SGLT 2 inhibitor and metformin Current orders for Inpatient glycemic control: sensitive correction insulin tidwc and HS scales Inpatient Diabetes Program Recommendations:    Patient is NPO for surgery today. Correction insulin is ordered tidwc and at HS Presently glucose is still high in the 300's. Noted Novolog 6 units to be given times 1. It is not documented as given yet. Just spoke with RN who is taking over the care of this patient at this time. Suggested changing the correction to q 4 hrs. She will give the one time dose of 6 units novolog now. Lantus is also scheduled to be started this am although this will not lower the glucose for at least 24 hrs once started. RN to check cbg now, give the 6 units novolog now and recheck cbg 2 hrs after given. Suggest moderate correction scale to be started 4 hrs after the one time dose of 6 units novolog. If glucose does not significantly decreaase, would suggest using the IV insulin per GlucoStabilizer order (Not for DKA).  I have paged MD but have not gotten a response yet. Will follow glucose throughout the day and glad to assist with recommendations for glucose control.  Spoke with patient regarding his history of DM. He states he was 30 years of age when diagnosed and put on Metformin, and then started on one of the SGLT-2 inhibitors (I assume since he stated it got his glucose down through his kidneys). He was getting his care and meds through the [name]. However patient states he missed an  appointment at the clinic and then was denied further care. He states that when he was checking his cbg's they were always in the 300's. Will discuss with care management for discharge follow-uip.  Thank you Rosita Kea, RN, MSN, CDE  Diabetes Inpatient Program Office: [phone] Pager: [phone] 8:00 am to 5:00 pm

## 2015-08-31 NOTE — Transfer of Care (Signed)
Immediate Anesthesia Transfer of Care Note  Patient: [name] [name]  Procedure(s) Performed: Procedure(s): IRRIGATION AND DEBRIDEMENT ABSCESS LEFT SHOULDER (Left)  Patient Location: PACU  Anesthesia Type:General  Level of Consciousness: sedated  Airway & Oxygen Therapy: Patient Spontanous Breathing and Patient connected to face mask oxygen  Post-op Assessment: Report given to RN and Post -op Vital signs reviewed and stable  Post vital signs: Reviewed and stable  Last Vitals:  Filed Vitals:   08/31/15 1330 08/31/15 1345  BP: 127/86 133/93  Pulse: 90 86  Temp: 36.9 C 36.9 C  Resp: 17 17    Last Pain:  Filed Vitals:   08/31/15 1347  PainSc: 0-No pain         Complications: No apparent anesthesia complications

## 2015-08-31 NOTE — H&P (Signed)
[name] [name] is an 30 y.o. male.    General Surgery [facility]  Chief Complaint: abscess left shoulder, uncontrolled diabetes  HPI: patient is a 30 yo WM with one week hx of enlarging abscess on the left posterior shoulder.  Patient states two smaller abscesses were present which he drained himself.  Then developed enlarging abscess which he was unable to drain at home.  Patient is uncontrolled and untreated diabetic - says he has no money for insulin as he texts on his cell phone.  Glucose 400+ at presentation to ER.  CT scan shows loculated collection in left posterior shoulder region with extension into posterior axilla with lymphadenopathy.  Medical service to see in ER and address uncontrolled DM pre-operatively.  Past Medical History  Diagnosis Date  . Diabetes mellitus without complication ([place])   . Asthma   . Hypertension     History reviewed. No pertinent past surgical history.  History reviewed. No pertinent family history. Social History:  reports that he has never smoked. He does not have any smokeless tobacco history on file. He reports that he does not drink alcohol or use illicit drugs.  Allergies: No Known Allergies   (Not in a hospital admission)  Results for orders placed or performed during the hospital encounter of 08/30/15 (from the past 48 hour(s))  CBG monitoring, ED     Status: Abnormal   Collection Time: 08/30/15  6:30 PM  Result Value Ref Range   Glucose-Capillary 423 (H) 65 - 99 mg/dL  CBC with Differential/Platelet     Status: Abnormal   Collection Time: 08/30/15 11:07 PM  Result Value Ref Range   WBC 16.9 (H) 4.0 - 10.5 K/uL   RBC 4.94 4.22 - 5.81 MIL/uL   Hemoglobin 14.8 13.0 - 17.0 g/dL   HCT 40.5 39.0 - 52.0 %   MCV 82.0 78.0 - 100.0 fL   MCH 30.0 26.0 - 34.0 pg   MCHC 36.5 (H) 30.0 - 36.0 g/dL   RDW 12.2 11.5 - 15.5 %   Platelets 273 150 - 400 K/uL   Neutrophils Relative % 79 %   Neutro Abs 13.4 (H) 1.7 - 7.7 K/uL    Lymphocytes Relative 13 %   Lymphs Abs 2.2 0.7 - 4.0 K/uL   Monocytes Relative 7 %   Monocytes Absolute 1.2 (H) 0.1 - 1.0 K/uL   Eosinophils Relative 1 %   Eosinophils Absolute 0.1 0.0 - 0.7 K/uL   Basophils Relative 0 %   Basophils Absolute 0.0 0.0 - 0.1 K/uL  Comprehensive metabolic panel     Status: Abnormal   Collection Time: 08/30/15 11:07 PM  Result Value Ref Range   Sodium 131 (L) 135 - 145 mmol/L   Potassium 3.9 3.5 - 5.1 mmol/L   Chloride 93 (L) 101 - 111 mmol/L   CO2 29 22 - 32 mmol/L   Glucose, Bld 420 (H) 65 - 99 mg/dL   BUN 9 6 - 20 mg/dL   Creatinine, Ser 0.81 0.61 - 1.24 mg/dL   Calcium 9.3 8.9 - 10.3 mg/dL   Total Protein 7.7 6.5 - 8.1 g/dL   Albumin 3.8 3.5 - 5.0 g/dL   AST 12 (L) 15 - 41 U/L   ALT 18 17 - 63 U/L   Alkaline Phosphatase 95 38 - 126 U/L   Total Bilirubin 0.7 0.3 - 1.2 mg/dL   GFR calc non Af Amer >60 >60 mL/min   GFR calc Af Amer >60 >60 mL/min  Comment: (NOTE) The eGFR has been calculated using the CKD EPI equation. This calculation has not been validated in all clinical situations. eGFR's persistently <60 mL/min signify possible Chronic Kidney Disease.    Anion gap 9 5 - 15  I-Stat CG4 Lactic Acid, ED     Status: None   Collection Time: 08/30/15 11:16 PM  Result Value Ref Range   Lactic Acid, Venous 1.37 0.5 - 2.0 mmol/L  I-Stat CG4 Lactic Acid, ED     Status: None   Collection Time: 08/31/15  2:01 AM  Result Value Ref Range   Lactic Acid, Venous 1.83 0.5 - 2.0 mmol/L  CBG monitoring, ED     Status: Abnormal   Collection Time: 08/31/15  2:30 AM  Result Value Ref Range   Glucose-Capillary 401 (H) 65 - 99 mg/dL  CBG monitoring, ED     Status: Abnormal   Collection Time: 08/31/15  4:05 AM  Result Value Ref Range   Glucose-Capillary 355 (H) 65 - 99 mg/dL   Dg Wrist Complete Right  08/31/2015  CLINICAL DATA:  Pain and swelling of the right wrist for several days after injury. EXAM: RIGHT WRIST - COMPLETE 3+ VIEW COMPARISON:  None.  FINDINGS: Old fracture deformity of the right fifth metacarpal bone. No evidence of acute fracture or dislocation in the right wrist. No focal bone lesion or bone destruction. Bone cortex appears intact. Soft tissues are unremarkable. IMPRESSION: No acute bony abnormalities. Electronically Signed   By: Lucienne Capers M.D.   On: 08/31/2015 00:13   Ct Chest W Contrast  08/31/2015  CLINICAL DATA:  Left axillary abscess going into the left back. Elevated white cells. History of hypertension, asthma, and diabetes. EXAM: CT CHEST WITH CONTRAST TECHNIQUE: Multidetector CT imaging of the chest was performed during intravenous contrast administration. CONTRAST:  75 mL Isovue-300 COMPARISON:  None. FINDINGS: There is a loculated fluid collection in the subcutaneous fat posterior to the left shoulder measuring about 5.9 x 8.4 x 7.9 cm. Peripheral enhancement. Appearance is nonspecific but would be compatible with history of abscess. Mild stranding in the surrounding fatty tissues. Lesion extends from the skin to the muscle layer but does not appear to involve muscular tissues. Enlarged lymph nodes in the left axilla are probably reactive. Normal heart size. Normal caliber thoracic aorta. Esophagus is decompressed. No significant lymphadenopathy in the mediastinum. Mild dependent changes in the lung bases. No focal airspace disease or consolidation. No pleural effusions. No pneumothorax. Airways are patent. Included portions of the upper abdominal organs are grossly unremarkable. No destructive bone lesions. IMPRESSION: Loculated subcutaneous fluid collection posterior to the left shoulder consistent with history of abscess. Enlarged lymph nodes in the left axilla are probably reactive or inflammatory. No evidence of active pulmonary disease. Electronically Signed   By: Lucienne Capers M.D.   On: 08/31/2015 01:56   Dg Hand Complete Right  08/31/2015  CLINICAL DATA:  Hit a wall 1 week ago. Ulnar-sided hand pain. Redness  and swelling. EXAM: RIGHT HAND - COMPLETE 3+ VIEW COMPARISON:  RIGHT wrist radiograph August 30, 2015 FINDINGS: There is no evidence of fracture or dislocation. There is no evidence of arthropathy or other focal bone abnormality. Soft tissues are unremarkable. IMPRESSION: Negative. Electronically Signed   By: Elon Alas M.D.   On: 08/31/2015 01:14    Review of Systems  Constitutional: Positive for fever and chills.  HENT: Negative.   Eyes: Negative.   Respiratory: Negative.   Cardiovascular: Negative.   Gastrointestinal: Negative.  Genitourinary: Negative.   Musculoskeletal: Positive for joint pain.  Skin: Negative.   Neurological: Negative.   Endo/Heme/Allergies: Negative.   Psychiatric/Behavioral: Negative.     Blood pressure 128/88, pulse 94, temperature 99.9 F (37.7 C), temperature source Oral, resp. rate 19, height _0  (1.702 m), weight 102.059 kg (225 lb), SpO2 97 %. Physical Exam  Constitutional: He is oriented to person, place, and time. He appears well-developed and well-nourished. No distress.  HENT:  Head: Normocephalic and atraumatic.  Right Ear: External ear normal.  Left Ear: External ear normal.  Mouth/Throat: No oropharyngeal exudate.  Eyes: Conjunctivae are normal. Pupils are equal, round, and reactive to light. No scleral icterus.  Neck: Normal range of motion. Neck supple. No tracheal deviation present. No thyromegaly present.  Cardiovascular: Normal rate, regular rhythm and normal heart sounds.   No murmur heard. Respiratory: Effort normal and breath sounds normal. No respiratory distress. He has no wheezes.  GI: Soft. Bowel sounds are normal. He exhibits no distension and no mass. There is no tenderness. There is no rebound and no guarding.  Musculoskeletal: Normal range of motion. He exhibits no edema.  Left postero lateral shoulder region with erythematous, indurated, fluctuent area at least 10 cm in diameter, extending into posterior axilla.  No  drainage.  Small 1 cm dry eshar present.  Neurological: He is alert and oriented to person, place, and time.  Skin: Skin is warm and dry. He is not diaphoretic.  Psychiatric: He has a normal mood and affect. His behavior is normal.     Assessment/Plan 1.  Large loculated abscess left posterior shoulder and axilla  Will require operative drainage / debridement  Needs glucose controlled pre-op  Keep NPO for OR today  Empiric abx started in ER - Vanco and Zosyn 2.  Uncontrolled diabetes with glucose > 400  Medical service consultation in ER 3. Right wrist injury  No known fracture - evaluated by Dr. Amedeo Plenty  Splint in place  Earnstine Regal, MD, [facility] Office: [place], [place] 08/31/2015, 5:06 AM

## 2015-08-31 NOTE — ED Provider Notes (Signed)
By signing my name below, I, Emmanuella Mensah, attest that this documentation has been prepared under the direction and in the presence of Audiel Scheiber, MD. Electronically Signed: Judithann Sauger, ED Scribe. 08/31/2015. 12:30 AM.   HPI Comments: Edyn Nordell is a 30 y.o. male who presents to the Emergency Department requesting evaluation for right wrist pain s/p punching wall 2 days ago. He also reports a large painful area of swelling to his left posterior axilla. Pt states that he has had 3 similar abscesses throughout his body this week alone which he drained himself. He reports that he has a hx of DM and he has not been able to check his sugar daily or afford his medications. Pt denies any elbow pain or n/v/d. He reports NKDA.   PE:  Musculoskeletal: right hand with 3 inch by 1.5 inch abscess, warm, mild erythema. Cap refill < 2 seconds. 4 inch abscess in diameter with lymphadenopathy to left posterior axilla, erythema and fluctuance. Lymph node in the axilla.  Cardiac: Regular rate and rhythm Lungs:CTAB Abdomen: gassy.   Eye: PERRL HENT: Thrush on the mouth   ED Course: IV ABX, admit to medicine with surgical consult.   Pt will receive chest CT for further evaluation.   I personally performed the services described in this documentation, which was scribed in my presence. The recorded information has been reviewed and is accurate.     Veatrice Kells, MD 08/31/15 [phone]

## 2015-08-31 NOTE — Transfer of Care (Signed)
Immediate Anesthesia Transfer of Care Note  Patient: [name] [name]  Procedure(s) Performed: Procedure(s): IRRIGATION AND DEBRIDEMENT ABSCESS LEFT SHOULDER (Left)  Patient Location: PACU  Anesthesia Type:General  Level of Consciousness: awake and alert   Airway & Oxygen Therapy: Patient Spontanous Breathing  Post-op Assessment: Report given to RN  Post vital signs: Reviewed  Last Vitals:  Filed Vitals:   08/31/15 1330 08/31/15 1345  BP: 127/86 133/93  Pulse: 90 86  Temp: 36.9 C 36.9 C  Resp: 17 17    Last Pain:  Filed Vitals:   08/31/15 1347  PainSc: 0-No pain         Complications: No apparent anesthesia complications

## 2015-08-31 NOTE — Anesthesia Preprocedure Evaluation (Addendum)
Anesthesia Evaluation  Patient identified by MRN, date of birth, ID band Patient awake    Reviewed: Allergy & Precautions, NPO status , Patient's Chart, lab work & pertinent test results  Airway Mallampati: I  TM Distance: >3 FB     Dental  (+) Dental Advisory Given   Pulmonary asthma ,    breath sounds clear to auscultation       Cardiovascular hypertension,  Rhythm:Regular Rate:Normal     Neuro/Psych negative neurological ROS     GI/Hepatic negative GI ROS, Neg liver ROS,   Endo/Other  diabetes, Poorly Controlled, Type 2Morbid obesity  Renal/GU negative Renal ROS     Musculoskeletal   Abdominal   Peds  Hematology negative hematology ROS (+)   Anesthesia Other Findings   Reproductive/Obstetrics                            Lab Results  Component Value Date   WBC 16.9* 08/30/2015   HGB 14.8 08/30/2015   HCT 40.5 08/30/2015   MCV 82.0 08/30/2015   PLT 273 08/30/2015   Lab Results  Component Value Date   CREATININE 0.81 08/30/2015   BUN 9 08/30/2015   NA 131* 08/30/2015   K 3.9 08/30/2015   CL 93* 08/30/2015   CO2 29 08/30/2015    Anesthesia Physical Anesthesia Plan  ASA: II  Anesthesia Plan: General   Post-op Pain Management:    Induction: Intravenous  Airway Management Planned: LMA  Additional Equipment:   Intra-op Plan:   Post-operative Plan: Extubation in OR  Informed Consent: I have reviewed the patients History and Physical, chart, labs and discussed the procedure including the risks, benefits and alternatives for the proposed anesthesia with the patient or authorized representative who has indicated his/her understanding and acceptance.   Dental advisory given  Plan Discussed with: CRNA  Anesthesia Plan Comments:        Anesthesia Quick Evaluation

## 2015-08-31 NOTE — Progress Notes (Signed)
Pharmacy Antibiotic Note  [name] [name] is a 30 y.o. male admitted on 08/30/2015 with Wound infection.  Pharmacy has been consulted for Vancomycin and Zosyn  dosing.  Plan: Vancomycin 1gm  IV every 8 hours.  Goal trough 15-20 mcg/mL. Zosyn 3.375g IV q8h (4 hour infusion).  Height: 5\' 7"  (170.2 cm) Weight: 225 lb (102.059 kg) IBW/kg (Calculated) : 66.1  Temp (24hrs), Avg:99.9 F (37.7 C), Min:99.9 F (37.7 C), Max:99.9 F (37.7 C)   Recent Labs Lab 08/30/15 2307 08/30/15 2316 08/31/15 0201  WBC 16.9*  --   --   CREATININE 0.81  --   --   LATICACIDVEN  --  1.37 1.83    Estimated Creatinine Clearance: 153.2 mL/min (by C-G formula based on Cr of 0.81).    No Known Allergies  Antimicrobials this admission: Vancomycin 08/31/2015 >> Zosyn 08/31/2015 >>   Dose adjustments this admission: -  Microbiology results: pending  Thank you for allowing pharmacy to be a part of this patient's care.  Nani Skillern Crowford 08/31/2015 5:32 AM

## 2015-08-31 NOTE — Anesthesia Postprocedure Evaluation (Signed)
Anesthesia Post Note  Patient: [name] [name]  Procedure(s) Performed: Procedure(s) (LRB): IRRIGATION AND DEBRIDEMENT ABSCESS LEFT SHOULDER (Left)  Patient location during evaluation: PACU Anesthesia Type: General Level of consciousness: awake and alert Pain management: pain level controlled Vital Signs Assessment: post-procedure vital signs reviewed and stable Respiratory status: spontaneous breathing, nonlabored ventilation, respiratory function stable and patient connected to nasal cannula oxygen Cardiovascular status: blood pressure returned to baseline and stable Postop Assessment: no signs of nausea or vomiting Anesthetic complications: no    Last Vitals:  Filed Vitals:   08/31/15 1330 08/31/15 1345  BP: 127/86 133/93  Pulse: 90 86  Temp: 36.9 C 36.9 C  Resp: 17 17    Last Pain:  Filed Vitals:   08/31/15 1347  PainSc: 0-No pain                 Tiajuana Amass

## 2015-08-31 NOTE — ED Notes (Signed)
Called to give report and waiting for nurse to call me.

## 2015-08-31 NOTE — Anesthesia Procedure Notes (Signed)
Procedure Name: Intubation Date/Time: 08/31/2015 12:14 PM Performed by: Cynda Familia Pre-anesthesia Checklist: Patient identified, Emergency Drugs available, Suction available and Patient being monitored Patient Re-evaluated:Patient Re-evaluated prior to inductionOxygen Delivery Method: Circle System Utilized Preoxygenation: Pre-oxygenation with 100% oxygen Intubation Type: IV induction Ventilation: Mask ventilation without difficulty Laryngoscope Size: Miller and 2 Grade View: Grade I Tube type: Oral Tube size: 7.0 mm Number of attempts: 1 Airway Equipment and Method: Stylet and Oral airway Placement Confirmation: ETT inserted through vocal cords under direct vision,  positive ETCO2 and breath sounds checked- equal and bilateral Secured at: 22 cm Tube secured with: Tape Dental Injury: Teeth and Oropharynx as per pre-operative assessment  Comments: Smooth IV induction--- intubation AM CRNA- atraumatic--- teeth and mouth as preop---

## 2015-08-31 NOTE — ED Provider Notes (Signed)
Left axillary abscess - will require surgical consult - [facility] CT pending to evaluate depth/extent On Vanc and Zosyn Has been seen by Gramig for wrist - cock up wrist splint  Discussed the patient's condition with Dr. Harlow Asa who will see in the ED and evaluate for admission for surgical I&D of large axillary abscess. He requests [facility] consult for management of DM. Discussed with Dr. Lily Kocher who will provide medical consultation.  Charlann Lange, PA-C 09/04/15 0123  April Palumbo, MD 09/04/15 0127

## 2015-08-31 NOTE — ED Notes (Signed)
Pt R forearm washed with warm soapy water. Neosporin applied and light gauze wrapped around R wrist.

## 2015-08-31 NOTE — Consult Note (Signed)
Reason for Consult: Right wrist pain Referring Physician: Emergency room staff  [name] [name] is an 30 y.o. male.  HPI: 30 year old male who states he hit a wall 1 week ago and since that time his had pain in his hand and wrist right arm. I was [place] and consult in regards to his right wrist. He is had negative radiographs of the hand and wrist. He is at bedside with a friend/family. The patient states that he is not had a history of gout or pseudogout in his wrist.  He has not had any specific treatment for the wrist.  Surgery is been called to evaluate his chest wall abscess on the left posterior chest wall  He has a white count which is high likely attributable to the abscess on the left posterior chest wall/shoulder  Patient is examined at length at bedside.  Patient notes no history of infection to the right wrist  Past Medical History  Diagnosis Date  . Diabetes mellitus without complication ([place])   . Asthma   . Hypertension     History reviewed. No pertinent past surgical history.  No family history on file.  Social History:  reports that he has never smoked. He does not have any smokeless tobacco history on file. He reports that he does not drink alcohol or use illicit drugs.  Allergies: No Known Allergies  Medications: I have reviewed the patient's current medications.  Results for orders placed or performed during the hospital encounter of 08/30/15 (from the past 48 hour(s))  CBG monitoring, ED     Status: Abnormal   Collection Time: 08/30/15  6:30 PM  Result Value Ref Range   Glucose-Capillary 423 (H) 65 - 99 mg/dL  CBC with Differential/Platelet     Status: Abnormal   Collection Time: 08/30/15 11:07 PM  Result Value Ref Range   WBC 16.9 (H) 4.0 - 10.5 K/uL   RBC 4.94 4.22 - 5.81 MIL/uL   Hemoglobin 14.8 13.0 - 17.0 g/dL   HCT 40.5 39.0 - 52.0 %   MCV 82.0 78.0 - 100.0 fL   MCH 30.0 26.0 - 34.0 pg   MCHC 36.5 (H) 30.0 - 36.0 g/dL   RDW 12.2 11.5 - 15.5 %    Platelets 273 150 - 400 K/uL   Neutrophils Relative % 79 %   Neutro Abs 13.4 (H) 1.7 - 7.7 K/uL   Lymphocytes Relative 13 %   Lymphs Abs 2.2 0.7 - 4.0 K/uL   Monocytes Relative 7 %   Monocytes Absolute 1.2 (H) 0.1 - 1.0 K/uL   Eosinophils Relative 1 %   Eosinophils Absolute 0.1 0.0 - 0.7 K/uL   Basophils Relative 0 %   Basophils Absolute 0.0 0.0 - 0.1 K/uL  Comprehensive metabolic panel     Status: Abnormal   Collection Time: 08/30/15 11:07 PM  Result Value Ref Range   Sodium 131 (L) 135 - 145 mmol/L   Potassium 3.9 3.5 - 5.1 mmol/L   Chloride 93 (L) 101 - 111 mmol/L   CO2 29 22 - 32 mmol/L   Glucose, Bld 420 (H) 65 - 99 mg/dL   BUN 9 6 - 20 mg/dL   Creatinine, Ser 0.81 0.61 - 1.24 mg/dL   Calcium 9.3 8.9 - 10.3 mg/dL   Total Protein 7.7 6.5 - 8.1 g/dL   Albumin 3.8 3.5 - 5.0 g/dL   AST 12 (L) 15 - 41 U/L   ALT 18 17 - 63 U/L   Alkaline Phosphatase 95 38 -  126 U/L   Total Bilirubin 0.7 0.3 - 1.2 mg/dL   GFR calc non Af Amer >60 >60 mL/min   GFR calc Af Amer >60 >60 mL/min    Comment: (NOTE) The eGFR has been calculated using the CKD EPI equation. This calculation has not been validated in all clinical situations. eGFR's persistently <60 mL/min signify possible Chronic Kidney Disease.    Anion gap 9 5 - 15  I-Stat CG4 Lactic Acid, ED     Status: None   Collection Time: 08/30/15 11:16 PM  Result Value Ref Range   Lactic Acid, Venous 1.37 0.5 - 2.0 mmol/L    Dg Wrist Complete Right  08/31/2015  CLINICAL DATA:  Pain and swelling of the right wrist for several days after injury. EXAM: RIGHT WRIST - COMPLETE 3+ VIEW COMPARISON:  None. FINDINGS: Old fracture deformity of the right fifth metacarpal bone. No evidence of acute fracture or dislocation in the right wrist. No focal bone lesion or bone destruction. Bone cortex appears intact. Soft tissues are unremarkable. IMPRESSION: No acute bony abnormalities. Electronically Signed   By: Lucienne Capers M.D.   On: 08/31/2015 00:13    Dg Hand Complete Right  08/31/2015  CLINICAL DATA:  Hit a wall 1 week ago. Ulnar-sided hand pain. Redness and swelling. EXAM: RIGHT HAND - COMPLETE 3+ VIEW COMPARISON:  RIGHT wrist radiograph August 30, 2015 FINDINGS: There is no evidence of fracture or dislocation. There is no evidence of arthropathy or other focal bone abnormality. Soft tissues are unremarkable. IMPRESSION: Negative. Electronically Signed   By: Elon Alas M.D.   On: 08/31/2015 01:14    ROS Blood pressure 123/84, pulse 102, temperature 99.9 F (37.7 C), temperature source Oral, resp. rate 18, height _0  (1.702 m), weight 102.059 kg (225 lb), SpO2 97 %. Physical Exam Examination of the right upper extremity reveals intact pulses and sensation. He has normal radial median and ulnar nerve sensation. He is able to flex his fingers through a 70% arc of motion. His FDP FDS and extensor function is intact through this arc. He does not have any advanced abscess or obvious fluctuance on soft tissue exam.  He has some slight swelling perhaps in the volar forearm. I do not see a definable abscess to drain. He does not have excessive saline lytic features either. His forearm and elbow are stable and the right arm.  His left elbow forearm wrist and hand are stable. He does have the noted posterior shoulder/chest wall abscess which is been present for quite some time.  I reviewed his x-rays personally. I see an old fracture to the fifth metacarpal carpal and some early degenerative changes at his fifth [facility] joint. His wrist has some widening of the scapholunate interval noted by volar flexion of the scaphoid. There is no evidence of acute fracture Assessment/Plan: #1 Right wrist pain without obvious abscess. #2 left posterior shoulder/chest wall mass indicative of abscess I recommended washing the arm a simple wrist brace for comfort elevation and encourage finger range of motion. We'll be happy to follow him along during his  inpatient status. I do not see an obvious abscess however if he declares himself in a worsening fashion I will consider higher tier diagnostic study.  I discussed these issues with patient at bedside and all questions have been encouraged and answered.  I discussed this with the physician assistantm (Abby) who called inquiring about the wrist.   Denice Bors M 08/31/2015, 1:53 AM

## 2015-08-31 NOTE — ED Notes (Signed)
Pt also informed that he is to remain NPO

## 2015-09-01 DIAGNOSIS — R030 Elevated blood-pressure reading, without diagnosis of hypertension: Secondary | ICD-10-CM

## 2015-09-01 DIAGNOSIS — E876 Hypokalemia: Secondary | ICD-10-CM

## 2015-09-01 LAB — GLUCOSE, CAPILLARY
Glucose-Capillary: 307 mg/dL — ABNORMAL HIGH (ref 65–99)
Glucose-Capillary: 355 mg/dL — ABNORMAL HIGH (ref 65–99)
Glucose-Capillary: 252 mg/dL — ABNORMAL HIGH (ref 65–99)
Glucose-Capillary: 242 mg/dL — ABNORMAL HIGH (ref 65–99)

## 2015-09-01 LAB — HEMOGLOBIN A1C
Hgb A1c MFr Bld: 13.7 % — ABNORMAL HIGH (ref 4.8–5.6)
Mean Plasma Glucose: 346 mg/dL

## 2015-09-01 LAB — COMPREHENSIVE METABOLIC PANEL
ALBUMIN: 2.9 g/dL — AB (ref 3.5–5.0)
ALT: 15 U/L — AB (ref 17–63)
AST: 16 U/L (ref 15–41)
Alkaline Phosphatase: 87 U/L (ref 38–126)
Anion gap: 7 (ref 5–15)
BUN: 8 mg/dL (ref 6–20)
CHLORIDE: 100 mmol/L — AB (ref 101–111)
CO2: 27 mmol/L (ref 22–32)
CREATININE: 0.58 mg/dL — AB (ref 0.61–1.24)
Calcium: 8.5 mg/dL — ABNORMAL LOW (ref 8.9–10.3)
GFR calc Af Amer: >60 mL/min (ref >60)
GLUCOSE: 310 mg/dL — AB (ref 65–99)
POTASSIUM: 3.2 mmol/L — AB (ref 3.5–5.1)
SODIUM: 134 mmol/L — AB (ref 135–145)
Total Bilirubin: 0.9 mg/dL (ref 0.3–1.2)
Total Protein: 6 g/dL — ABNORMAL LOW (ref 6.5–8.1)

## 2015-09-01 LAB — VANCOMYCIN, TROUGH: Vancomycin Tr: 10 ug/mL (ref 10.0–20.0)

## 2015-09-01 MED ORDER — LIVING WELL WITH DIABETES BOOK
Freq: Once | Status: AC
  Administered 2015-09-01: 15:00:00
  Filled 2015-09-01: qty 1

## 2015-09-01 MED ORDER — VANCOMYCIN HCL 10 G IV SOLR
1250.0000 mg | Freq: Three times a day (TID) | INTRAVENOUS | Status: DC
  Administered 2015-09-01 – 2015-09-04 (×9): 1250 mg via INTRAVENOUS
  Filled 2015-09-01 (×10): qty 1250

## 2015-09-01 MED ORDER — INSULIN STARTER KIT- SYRINGES (ENGLISH)
1.0000 | Freq: Once | Status: AC
  Administered 2015-09-01: 1
  Filled 2015-09-01: qty 1

## 2015-09-01 MED ORDER — INSULIN ASPART 100 UNIT/ML ~~LOC~~ SOLN
6.0000 [IU] | Freq: Three times a day (TID) | SUBCUTANEOUS | Status: DC
  Administered 2015-09-01 – 2015-09-02 (×4): 6 [IU] via SUBCUTANEOUS

## 2015-09-01 MED ORDER — POTASSIUM CHLORIDE 20 MEQ PO PACK
40.0000 meq | PACK | Freq: Once | ORAL | Status: DC
  Filled 2015-09-01: qty 2

## 2015-09-01 MED ORDER — POTASSIUM CHLORIDE CRYS ER 20 MEQ PO TBCR
40.0000 meq | EXTENDED_RELEASE_TABLET | Freq: Once | ORAL | Status: AC
  Administered 2015-09-01: 40 meq via ORAL
  Filled 2015-09-01: qty 2

## 2015-09-01 NOTE — Progress Notes (Signed)
Inpatient Diabetes Program Recommendations  AACE/ADA: New Consensus Statement on Inpatient Glycemic Control (2015)  Target Ranges:  Prepandial:   less than 140 mg/dL      Peak postprandial:   less than 180 mg/dL (1-2 hours)      Critically ill patients:  140 - 180 mg/dL   Results for SHIVEN, STATER (MRN [id number]) as of 09/01/2015 14:37  Ref. Range 08/31/2015 02:30 08/31/2015 04:05 08/31/2015 08:47 08/31/2015 10:14 08/31/2015 11:38 08/31/2015 13:15 08/31/2015 17:32 08/31/2015 19:09 08/31/2015 21:49  Glucose-Capillary Latest Ref Range: 65-99 mg/dL 401 (H) 355 (H) 313 (H) 330 (H) 282 (H) 232 (H) 411 (H) 361 (H) 216 (H)   Results for BOW, ADER (MRN [id number]) as of 09/01/2015 14:37  Ref. Range 09/01/2015 08:18 09/01/2015 11:51  Glucose-Capillary Latest Ref Range: 65-99 mg/dL 307 (H) 355 (H)   Results for DEVERY, OBERHELMAN (MRN [id number]) as of 09/01/2015 14:37  Ref. Range 08/31/2015 10:20  Hemoglobin A1C Latest Ref Range: 4.8-5.6 % 13.7 (H)    Admit with: Abscess Shoulder/ Hyperglycemia  History: DM (since age 44)  Home DM Meds: None  Current Insulin Orders: Lantus 10 units bid      Novolog Moderate Correction Scale/ SSI (0-15 units) TID AC + HS      Novolog 6 units tidwc     Spoke to patient and his SO about his current A1c of 13.7%.  Explained what an A1c is and what it measures.  Reminded patient that his goal A1c is 7% or less per ADA standards to prevent both acute and long-term complications.  Explained to patient the extreme importance of good glucose control at home.  Encouraged patient to check his CBGs at least bid at home (fasting and another check within the day) and to record all CBGs in a logbook for his PCP to review.  Patient stated he has taken insulin before when he was in prison.  Was allowed to inject himself and was taught how to give shots in prison but this was a while ago.  Has not taken any medications for his DM in the recent past.  Family member in room inquired if the  dietitian could come and speak with them about healthy eating at home.  Placed RD consult for DM diet education.  Discussed basic DM diet information with patient.  Encouraged patient to avoid beverages with sugar (regular soda, sweet tea, lemonade, fruit juice) and to consume mostly water.  Discussed what foods contain carbohydrates and how carbohydrates affect the body's blood sugar levels.  Encouraged patient to be careful with his portion sizes (especially grains, starchy vegetables, and fruits).  Explained to patient that men should have 60-75 grams of carbohydrates per meal per day.  Have asked RNs to begin insulin teaching with patient in case decision made to send pt home on insulin.  Also, gave patient information on purchasing an inexpensive CBG meter and strips at [facility] OTC without a Rx.  Meter is $9 and a box of 50 strips is $9.    --Will follow patient during hospitalization--  Wyn Quaker RN, MSN, CDE Diabetes Coordinator Inpatient Glycemic Control Team Team Pager: [phone] (8a-5p)

## 2015-09-01 NOTE — Care Management Note (Addendum)
Case Management Note  Patient Details  Name: Rande Kerstein MRN: [id number] Date of Birth: 1985/05/09  Subjective/Objective:                  Abscess and uncontrolled DM Action/Plan: Discharge planning Expected Discharge Date:   (unknown)               Expected Discharge Plan:  Home/Self Care  In-House Referral:     Discharge planning Services  CM Consult  Post Acute Care Choice:    Choice offered to:     DME Arranged:    DME Agency:     HH Arranged:    HH Agency:     Status of Service:  In process, will continue to follow  Medicare Important Message Given:    Date Medicare IM Given:    Medicare IM give by:    Date Additional Medicare IM Given:    Additional Medicare Important Message give by:     If discussed at [place] of Stay Meetings, dates discussed:    Additional Comments: 19:33 CM has spoken with [name] who verbalized understanding of follow up care at [facility]. Cm has arranged a follow up appt with [facility] (as [facility] not accepting pts at this time) for follow up medical care, DM education and medication asst, to secure a PCP and to begin the insurance process.  Pt will probably need a MATCH letter to help defray the cost of medications when he discharges.  CALL CM ON DAY OF DISCHARGE TO ARRANGE FOR MATCH.  Cm will follow progression and additional needs. Dellie Catholic, RN 09/01/2015, 3:25 PM

## 2015-09-01 NOTE — Progress Notes (Signed)
PROGRESS NOTE    Jedediah Al  [id number] DOB: [birth year] DOA: 08/30/2015 PCP: No primary care provider on file.  Outpatient Specialists:   Brief Narrative: 30 y.o. male with history of DM, HTN and asthma. Patient reported being a diabetic since his early 20's, and was on oral diabetic medication at some point. Patient has not been taking medication for his insulin or hypertension due to financial challenges according to the patient. Patient developed boils about 3 weeks ago, and has had boils on 3 different occasions that he has self managed. Last boil has been persistent and has gotten significantly larger necessitating patient's visit to the ER. On presentation to the hospital, the blood sugar was found to be greater than 400, but BP was controlled. Patient also looked volume depleted. No fever or chills, no headache, no neck pain, no chest pain, no GI symptoms and no urinary symptoms. CBC revealed leukocytosis. CT scan revealed abscess and reactive lymphnode left axilla. Patient has been admitted by the Surgery team and I and D is planned. Patient had been fairly active, without limitations. Patient denied prior cardiac illness. As mentioned above, no chest pain and no shortness of breath with activity.   Patient was transferred to Hospitalist service post I and D by the surgical team. Blood sugar is still not controlled. Patient will need wound care, case management to assist with discharge plans and needs, as well as ability to provide medications for insulin. Right wrist swelling is noted and the Orthopedic (Hand) team has been consulted. Elevated BP noted post op.  Assessment & Plan:   Principal Problem:   Abscess of left shoulder Active Problems:   Uncontrolled diabetes mellitus (HCC)   Abscess  Swollen right wrist   Non compliane   Hypokalemia  - Will continue to control patient's blood sugar. Will start insulin with meals. - [place] Lantus insulin 10 Units twice daily - SSI insulin  coverage. - HbA1c is greater than 13. - Case management consult - Continue hydration. - Wound care consult - Hand surgery (Orthopedic) consulted for swollen right wrist joint. - Monitor electrolytes and replete  DVT prophylaxis: Derma Lovenox Code Status: Full Family Communication:  Disposition Plan: Home eventually  Consultants:   General Surgery  Hand Surgery (Orthopedics)  Procedures:   I and D done on 08/31/2015.  Antimicrobials:   Vancomycin and Zosyn   Subjective: Nil new complaints. No fever or chills. Feeling better. Blood sugar is still uncontrolled.  Objective: Filed Vitals:   08/31/15 2158 09/01/15 0119 09/01/15 0520 09/01/15 0907  BP: 126/72 139/69 142/76 170/90  Pulse: 106 99 80 85  Temp: 98.9 F (37.2 C) 99.3 F (37.4 C) 98 F (36.7 C) 98.3 F (36.8 C)  TempSrc: Oral Oral Oral Oral  Resp: 18 20 21 20   Height:      Weight:      SpO2: 98% 100% 99% 99%    Intake/Output Summary (Last 24 hours) at 09/01/15 0935 Last data filed at 09/01/15 0908  Gross per 24 hour  Intake 3563.39 ml  Output   2000 ml  Net 1563.39 ml   Filed Weights   08/31/15 0129  Weight: 102.059 kg (225 lb)    Examination:  Constitutional: Obese. Alert and awake, oriented x 3. Not in any acute distress. Eyes: No pallor or jaundice.  ENMT: Dry buccal Mucosa.  Neck: Neck is supple. No JVD.  CVS: S1-S2. Respiratory: clear to auscultation bilaterally, no wheezing,. Abdomen: Obese, soft and non tender.  Neuro: Awake and  alert. Patient moves all limbs.  Data Reviewed: I have personally reviewed following labs.  CBC:  Recent Labs Lab 08/30/15 2307 08/31/15 1020  WBC 16.9* 15.8*  NEUTROABS 13.4*  --   HGB 14.8 13.0  HCT 40.5 36.6*  MCV 82.0 83.4  PLT 273 99991111   Basic Metabolic Panel:  Recent Labs Lab 08/30/15 2307 08/31/15 1020 09/01/15 0446  NA 131*  --  134*  K 3.9  --  3.2*  CL 93*  --  100*  CO2 29  --  27  GLUCOSE 420*  --  310*  BUN 9   --  8  CREATININE 0.81 0.65 0.58*  CALCIUM 9.3  --  8.5*  MG  --  1.9  --   PHOS  --  3.6  --    GFR: Estimated Creatinine Clearance: 155.1 mL/min (by C-G formula based on Cr of 0.58). Liver Function Tests:  Recent Labs Lab 08/30/15 2307 09/01/15 0446  AST 12* 16  ALT 18 15*  ALKPHOS 95 87  BILITOT 0.7 0.9  PROT 7.7 6.0*  ALBUMIN 3.8 2.9*   No results for input(s): LIPASE, AMYLASE in the last 168 hours. No results for input(s): AMMONIA in the last 168 hours. Coagulation Profile: No results for input(s): INR, PROTIME in the last 168 hours. Cardiac Enzymes: No results for input(s): CKTOTAL, CKMB, CKMBINDEX, TROPONINI in the last 168 hours. BNP (last 3 results) No results for input(s): PROBNP in the last 8760 hours. HbA1C:  Recent Labs  08/31/15 1020  HGBA1C 13.7*   CBG:  Recent Labs Lab 08/31/15 1315 08/31/15 1732 08/31/15 1909 08/31/15 2149 09/01/15 0818  GLUCAP 232* 411* 361* 216* 307*   Lipid Profile: No results for input(s): CHOL, HDL, LDLCALC, TRIG, CHOLHDL, LDLDIRECT in the last 72 hours. Thyroid Function Tests:  Recent Labs  08/31/15 1020  TSH 0.901   Anemia Panel: No results for input(s): VITAMINB12, FOLATE, FERRITIN, TIBC, IRON, RETICCTPCT in the last 72 hours. Urine analysis: No results found for: COLORURINE, APPEARANCEUR, LABSPEC, PHURINE, GLUCOSEU, HGBUR, BILIRUBINUR, KETONESUR, PROTEINUR, UROBILINOGEN, NITRITE, LEUKOCYTESUR Sepsis Labs: @LABRCNTIP (procalcitonin:4,lacticidven:4)  ) Recent Results (from the past 240 hour(s))  Surgical pcr screen     Status: None   Collection Time: 08/31/15 11:00 AM  Result Value Ref Range Status   MRSA, PCR NEGATIVE NEGATIVE Final   Staphylococcus aureus NEGATIVE NEGATIVE Final    Comment:        The Xpert SA Assay (FDA approved for NASAL specimens in patients over 20 years of age), is one component of a comprehensive surveillance program.  Test performance has been validated by [facility] for  patients greater than or equal to 34 year old. It is not intended to diagnose infection nor to guide or monitor treatment.   Aerobic/Anaerobic Culture (surgical/deep wound)     Status: None (Preliminary result)   Collection Time: 08/31/15 12:40 PM  Result Value Ref Range Status   Specimen Description ABSCESS LEFT SHOULDER IRRIGATION & DEBRIDEMENT  Final   Special Requests NONE  Final   Gram Stain   Final    ABUNDANT WBC PRESENT, PREDOMINANTLY PMN MODERATE GRAM POSITIVE COCCI IN CLUSTERS IN PAIRS Performed at [facility]    Culture PENDING  Incomplete   Report Status PENDING  Incomplete         Radiology Studies: Dg Wrist Complete Right  08/31/2015  CLINICAL DATA:  Pain and swelling of the right wrist for several days after injury. EXAM: RIGHT WRIST - COMPLETE 3+ VIEW  COMPARISON:  None. FINDINGS: Old fracture deformity of the right fifth metacarpal bone. No evidence of acute fracture or dislocation in the right wrist. No focal bone lesion or bone destruction. Bone cortex appears intact. Soft tissues are unremarkable. IMPRESSION: No acute bony abnormalities. Electronically Signed   By: Lucienne Capers M.D.   On: 08/31/2015 00:13   Ct Chest W Contrast  08/31/2015  CLINICAL DATA:  Left axillary abscess going into the left back. Elevated white cells. History of hypertension, asthma, and diabetes. EXAM: CT CHEST WITH CONTRAST TECHNIQUE: Multidetector CT imaging of the chest was performed during intravenous contrast administration. CONTRAST:  75 mL Isovue-300 COMPARISON:  None. FINDINGS: There is a loculated fluid collection in the subcutaneous fat posterior to the left shoulder measuring about 5.9 x 8.4 x 7.9 cm. Peripheral enhancement. Appearance is nonspecific but would be compatible with history of abscess. Mild stranding in the surrounding fatty tissues. Lesion extends from the skin to the muscle layer but does not appear to involve muscular tissues. Enlarged lymph nodes in the  left axilla are probably reactive. Normal heart size. Normal caliber thoracic aorta. Esophagus is decompressed. No significant lymphadenopathy in the mediastinum. Mild dependent changes in the lung bases. No focal airspace disease or consolidation. No pleural effusions. No pneumothorax. Airways are patent. Included portions of the upper abdominal organs are grossly unremarkable. No destructive bone lesions. IMPRESSION: Loculated subcutaneous fluid collection posterior to the left shoulder consistent with history of abscess. Enlarged lymph nodes in the left axilla are probably reactive or inflammatory. No evidence of active pulmonary disease. Electronically Signed   By: Lucienne Capers M.D.   On: 08/31/2015 01:56   Dg Hand Complete Right  08/31/2015  CLINICAL DATA:  Hit a wall 1 week ago. Ulnar-sided hand pain. Redness and swelling. EXAM: RIGHT HAND - COMPLETE 3+ VIEW COMPARISON:  RIGHT wrist radiograph August 30, 2015 FINDINGS: There is no evidence of fracture or dislocation. There is no evidence of arthropathy or other focal bone abnormality. Soft tissues are unremarkable. IMPRESSION: Negative. Electronically Signed   By: Elon Alas M.D.   On: 08/31/2015 01:14        Scheduled Meds: . enoxaparin (LOVENOX) injection  50 mg Subcutaneous Q24H  . insulin aspart  0-15 Units Subcutaneous TID WC  . insulin aspart  0-5 Units Subcutaneous QHS  . insulin aspart  6 Units Subcutaneous TID WC  . insulin glargine  10 Units Subcutaneous BID  . piperacillin-tazobactam (ZOSYN)  IV  3.375 g Intravenous Q8H  . vancomycin  1,000 mg Intravenous Q8H   Continuous Infusions: . sodium chloride 100 mL/hr at 08/31/15 0534     LOS: 1 day    Time spent: 35 Minutes.    Dana Allan, MD  [facility] Pager #: [phone] 7PM-7AM contact night coverage as above

## 2015-09-01 NOTE — Progress Notes (Signed)
  Patient is currently followed by surgical team and they will continue to assess and plan for wound care. Consult with surgical team for further questions and the wound care team  will not consult at this time unless a re consult is ordered.   Re consult if needed, will not follow at this time. Thanks, Melba Coon MSN, RN, [employer]

## 2015-09-01 NOTE — Progress Notes (Addendum)
1 Day Post-Op  Subjective: Pt major complaint is his right wrist, says it hurts when off pain meds and the main reason he came to the ED.  Open site dressing changed and looks fine.  His wife will need to learn how to do the dressing change.      Objective: Vital signs in last 24 hours: Temp:  [97.7 F (36.5 C)-99.3 F (37.4 C)] 98.3 F (36.8 C) (06/15 0907) Pulse Rate:  [80-106] 85 (06/15 0907) Resp:  [14-188] 20 (06/15 0907) BP: (126-170)/(69-100) 170/90 mmHg (06/15 0907) SpO2:  [97 %-100 %] 99 % (06/15 0907)    Intake/Output from previous day: 06/14 0701 - 06/15 0700 In: 3563.4 [P.O.:120; I.V.:2743.3; IV Piggyback:700.1] Out: 1400 [Urine:1350; Blood:50] Intake/Output this shift: Total I/O In: 0  Out: 600 [Urine:600]  General appearance: alert, cooperative and no distress Incision/Wound: I pulled out all the packing and redressed with Kerlix wet with sterile saline.  Wound looks fine.    Lab Results:   Recent Labs  08/30/15 2307 08/31/15 1020  WBC 16.9* 15.8*  HGB 14.8 13.0  HCT 40.5 36.6*  PLT 273 257    BMET  Recent Labs  08/30/15 2307 08/31/15 1020 09/01/15 0446  NA 131*  --  134*  K 3.9  --  3.2*  CL 93*  --  100*  CO2 29  --  27  GLUCOSE 420*  --  310*  BUN 9  --  8  CREATININE 0.81 0.65 0.58*  CALCIUM 9.3  --  8.5*   PT/INR No results for input(s): LABPROT, INR in the last 72 hours.   Recent Labs Lab 08/30/15 2307 09/01/15 0446  AST 12* 16  ALT 18 15*  ALKPHOS 95 87  BILITOT 0.7 0.9  PROT 7.7 6.0*  ALBUMIN 3.8 2.9*     Lipase  No results found for: LIPASE   Studies/Results: Dg Wrist Complete Right  08/31/2015  CLINICAL DATA:  Pain and swelling of the right wrist for several days after injury. EXAM: RIGHT WRIST - COMPLETE 3+ VIEW COMPARISON:  None. FINDINGS: Old fracture deformity of the right fifth metacarpal bone. No evidence of acute fracture or dislocation in the right wrist. No focal bone lesion or bone destruction. Bone  cortex appears intact. Soft tissues are unremarkable. IMPRESSION: No acute bony abnormalities. Electronically Signed   By: Lucienne Capers M.D.   On: 08/31/2015 00:13   Ct Chest W Contrast  08/31/2015  CLINICAL DATA:  Left axillary abscess going into the left back. Elevated white cells. History of hypertension, asthma, and diabetes. EXAM: CT CHEST WITH CONTRAST TECHNIQUE: Multidetector CT imaging of the chest was performed during intravenous contrast administration. CONTRAST:  75 mL Isovue-300 COMPARISON:  None. FINDINGS: There is a loculated fluid collection in the subcutaneous fat posterior to the left shoulder measuring about 5.9 x 8.4 x 7.9 cm. Peripheral enhancement. Appearance is nonspecific but would be compatible with history of abscess. Mild stranding in the surrounding fatty tissues. Lesion extends from the skin to the muscle layer but does not appear to involve muscular tissues. Enlarged lymph nodes in the left axilla are probably reactive. Normal heart size. Normal caliber thoracic aorta. Esophagus is decompressed. No significant lymphadenopathy in the mediastinum. Mild dependent changes in the lung bases. No focal airspace disease or consolidation. No pleural effusions. No pneumothorax. Airways are patent. Included portions of the upper abdominal organs are grossly unremarkable. No destructive bone lesions. IMPRESSION: Loculated subcutaneous fluid collection posterior to the left shoulder consistent with  history of abscess. Enlarged lymph nodes in the left axilla are probably reactive or inflammatory. No evidence of active pulmonary disease. Electronically Signed   By: Lucienne Capers M.D.   On: 08/31/2015 01:56   Dg Hand Complete Right  08/31/2015  CLINICAL DATA:  Hit a wall 1 week ago. Ulnar-sided hand pain. Redness and swelling. EXAM: RIGHT HAND - COMPLETE 3+ VIEW COMPARISON:  RIGHT wrist radiograph August 30, 2015 FINDINGS: There is no evidence of fracture or dislocation. There is no evidence  of arthropathy or other focal bone abnormality. Soft tissues are unremarkable. IMPRESSION: Negative. Electronically Signed   By: Elon Alas M.D.   On: 08/31/2015 01:14    Medications: . enoxaparin (LOVENOX) injection  50 mg Subcutaneous Q24H  . insulin aspart  0-15 Units Subcutaneous TID WC  . insulin aspart  0-5 Units Subcutaneous QHS  . insulin aspart  6 Units Subcutaneous TID WC  . insulin glargine  10 Units Subcutaneous BID  . piperacillin-tazobactam (ZOSYN)  IV  3.375 g Intravenous Q8H  . vancomycin  1,000 mg Intravenous Q8H    Assessment/Plan abcess left shoulder (6 x 12 cm) [photo at end of note]   IRRIGATION AND DEBRIDEMENT ABSCESS LEFT SHOULDER, 08/31/15, Dr. Lucia Gaskins Uncontrolled diabetes Swollen right wrist - seen by Dr. Amedeo Plenty in ER.  FEN: CArb ID:  Day 3 Zosyn and Vancomycin DVT:  Lovenox/SCD  Plan:  Continue antibiotics, pack wound BID, Diabetes per Medicine.  i would let Dr. Amedeo Plenty follow his wrist.      LOS: 1 day    JENNINGS,WILLARD 09/01/2015 [phone]  Agree with above. Dressing changed by Will.  Wife (she may be just his girlfriend) in room.  She is on disability and on Medicaid.  She'll need to change the dressing.   Alphonsa Overall, MD, [facility] Pager: [phone] Office phone:  [phone]

## 2015-09-01 NOTE — Progress Notes (Signed)
Pharmacy Antibiotic Note  [name] [name] is a 30 y.o. male admitted on 08/30/2015 with enlarging abscess of left shoulder.  S/p I&D on 6/14.  Pharmacy has been consulted for Vancomycin and Zosyn  dosing.  6/15 1500 Vanc trough = 10 on Vanc 1g IV q8h.  SCr improving, WBC improving, afebrile.  Plan:  Continue Zosyn 3.375g IV Q8H infused over 4hrs.   Increase to Vancomycin 1250mg  IV q8h  Measure Vanc trough at steady state.  Follow up renal fxn, culture results, and clinical course.  Temp (24hrs), Avg:98.3 F (36.8 C), Min:97.9 F (36.6 C), Max:99.3 F (37.4 C)   Recent Labs Lab 08/30/15 2307 08/30/15 2316 08/31/15 0201 08/31/15 1020 09/01/15 0446  WBC 16.9*  --   --  15.8*  --   CREATININE 0.81  --   --  0.65 0.58*  LATICACIDVEN  --  1.37 1.83  --   --     Estimated Creatinine Clearance: 155.1 mL/min (by C-G formula based on Cr of 0.58).    No Known Allergies    Antimicrobials this admission: Vancomycin 08/31/2015 >> Zosyn 08/31/2015 >>   Dose adjustments this admission: 6/15 1500 Vanc trough = 10 on Vanc 1g IV q8h  Microbiology results: 6/14 BCx x2: ngtd 6/14 abscess shoulder: gram stain showed GPC cocci in cluster, pairs - cx IP 6/14 MRSA PCR: negative  Thank you for allowing pharmacy to be a part of this patient's care.  Gretta Arab PharmD, BCPS Pager [phone] 09/01/2015 2:41 PM

## 2015-09-02 ENCOUNTER — Inpatient Hospital Stay (HOSPITAL_COMMUNITY): Payer: Self-pay

## 2015-09-02 DIAGNOSIS — R609 Edema, unspecified: Secondary | ICD-10-CM

## 2015-09-02 LAB — CBC WITH DIFFERENTIAL/PLATELET
Basophils Absolute: 0 10*3/uL (ref 0.0–0.1)
Basophils Relative: 0 %
Eosinophils Absolute: 0.3 10*3/uL (ref 0.0–0.7)
Eosinophils Relative: 2 %
HCT: 35.8 % — ABNORMAL LOW (ref 39.0–52.0)
Hemoglobin: 13 g/dL (ref 13.0–17.0)
Lymphocytes Relative: 11 %
Lymphs Abs: 1.7 10*3/uL (ref 0.7–4.0)
MCH: 29.9 pg (ref 26.0–34.0)
MCHC: 36.3 g/dL — ABNORMAL HIGH (ref 30.0–36.0)
MCV: 82.3 fL (ref 78.0–100.0)
Monocytes Absolute: 1.4 10*3/uL — ABNORMAL HIGH (ref 0.1–1.0)
Monocytes Relative: 9 %
Neutro Abs: 12.7 10*3/uL — ABNORMAL HIGH (ref 1.7–7.7)
Neutrophils Relative %: 78 %
Platelets: 266 10*3/uL (ref 150–400)
RBC: 4.35 MIL/uL (ref 4.22–5.81)
RDW: 12.2 % (ref 11.5–15.5)
WBC: 16.1 10*3/uL — ABNORMAL HIGH (ref 4.0–10.5)

## 2015-09-02 LAB — RENAL FUNCTION PANEL
Albumin: 3.1 g/dL — ABNORMAL LOW (ref 3.5–5.0)
Anion gap: 10 (ref 5–15)
BUN: 8 mg/dL (ref 6–20)
CO2: 26 mmol/L (ref 22–32)
Calcium: 8.9 mg/dL (ref 8.9–10.3)
Chloride: 98 mmol/L — ABNORMAL LOW (ref 101–111)
Creatinine, Ser: 0.81 mg/dL (ref 0.61–1.24)
GFR calc Af Amer: >60 mL/min (ref >60)
GFR calc non Af Amer: >60 mL/min (ref >60)
Glucose, Bld: 275 mg/dL — ABNORMAL HIGH (ref 65–99)
Phosphorus: 3.8 mg/dL (ref 2.5–4.6)
Potassium: 3.8 mmol/L (ref 3.5–5.1)
Sodium: 134 mmol/L — ABNORMAL LOW (ref 135–145)

## 2015-09-02 LAB — MAGNESIUM: Magnesium: 1.9 mg/dL (ref 1.7–2.4)

## 2015-09-02 LAB — GLUCOSE, CAPILLARY
Glucose-Capillary: 452 mg/dL — ABNORMAL HIGH (ref 65–99)
Glucose-Capillary: 375 mg/dL — ABNORMAL HIGH (ref 65–99)
Glucose-Capillary: 162 mg/dL — ABNORMAL HIGH (ref 65–99)
Glucose-Capillary: 223 mg/dL — ABNORMAL HIGH (ref 65–99)

## 2015-09-02 MED ORDER — INSULIN GLARGINE 100 UNIT/ML ~~LOC~~ SOLN
15.0000 [IU] | Freq: Two times a day (BID) | SUBCUTANEOUS | Status: DC
  Administered 2015-09-02: 15 [IU] via SUBCUTANEOUS
  Filled 2015-09-02 (×2): qty 0.15

## 2015-09-02 MED ORDER — INSULIN ASPART 100 UNIT/ML ~~LOC~~ SOLN: 10.0000 [IU] | Freq: Three times a day (TID) | SUBCUTANEOUS | Status: DC

## 2015-09-02 MED ORDER — MUPIROCIN 2 % EX OINT
TOPICAL_OINTMENT | Freq: Two times a day (BID) | CUTANEOUS | Status: DC
  Administered 2015-09-02 – 2015-09-03 (×2): via TOPICAL
  Administered 2015-09-04: 1 via TOPICAL
  Administered 2015-09-05: 10:00:00 via TOPICAL
  Administered 2015-09-05: 1 via TOPICAL
  Administered 2015-09-06: 11:00:00 via TOPICAL
  Administered 2015-09-06: 1 via TOPICAL
  Administered 2015-09-07 – 2015-09-11 (×9): via TOPICAL
  Filled 2015-09-02 (×4): qty 22

## 2015-09-02 MED ORDER — INSULIN GLARGINE 100 UNIT/ML ~~LOC~~ SOLN
20.0000 [IU] | Freq: Two times a day (BID) | SUBCUTANEOUS | Status: DC
  Administered 2015-09-02: 20 [IU] via SUBCUTANEOUS
  Filled 2015-09-02 (×2): qty 0.2

## 2015-09-02 MED ORDER — PIPERACILLIN-TAZOBACTAM 3.375 G IVPB
3.3750 g | Freq: Three times a day (TID) | INTRAVENOUS | Status: DC
  Administered 2015-09-03 – 2015-09-06 (×11): 3.375 g via INTRAVENOUS
  Filled 2015-09-02 (×11): qty 50

## 2015-09-02 MED ORDER — GADOBENATE DIMEGLUMINE 529 MG/ML IV SOLN
20.0000 mL | Freq: Once | INTRAVENOUS | Status: AC | PRN
  Administered 2015-09-02: 20 mL via INTRAVENOUS

## 2015-09-02 NOTE — Progress Notes (Signed)
Patient ID: [name] [name], male   DOB: March 01, 1986, 30 y.o.   MRN: [id number] Patient seen and examined at bedside  MRI pending  His right volar ulnar wrist is still tender and somewhat swollen. He has no wrist tenderness in the joint which would signify aseptic joint. He is neurovascularly intact with normal sensation and motor function to the fingers. There is no ascending red streaks or advancing erythematous change.  His history involves punching a wall 1 week ago with this extremity which could account for traumatic injury. This was mentioned that his initial consultation. Given the patient's pain response I do feel that an MRI would be appropriate. He may have muscle contusion or injury. Certainly in the face of his infection elsewhere he could have some hematogenous seeding to a hematoma. We will review the MRI and proceed accordingly.  At present time he is stable alert and without any advancing septic signs.  His been hard for him to elevate the arm but I asked him to his best as he can.  All questions have been encouraged and answered  Xochitl Egle M.D.

## 2015-09-02 NOTE — Discharge Instructions (Signed)
Dressing Change twice a day   Dressing Change A dressing is a material placed over wounds. It keeps the wound clean, dry, and protected from further injury.  BEFORE YOU BEGIN  Get your supplies together. Things you may need include:  Salt solution (saline).  Flexible gauze bandage.  Tape.  Gloves.  Belly (abdominal) pads.  Gauze squares.  Plastic bags.  Take pain medicine 30 minutes before the bandage change if you need it.  Take a shower before you do the first bandage change of the day. Put plastic wrap or a bag over the dressing. REMOVING YOUR OLD BANDAGE  Wash your hands with soap and water. Dry your hands with a clean towel.  Put on your gloves.  Remove any tape.  Remove the old bandage as told. If it sticks, put a small amount of warm water on it to loosen the bandage.  Remove any gauze or packing tape in your wound.  Take off your gloves.  Put the gloves, tape, gauze, or any packing tape in a plastic bag. CHANGING YOUR BANDAGE  Open the supplies.  Take the cap off the salt solution.  Open the gauze. Leave the gauze on the inside of the package.  Put on your gloves.  Clean your wound as told by your doctor.  Keep your wound dry if your doctor told you to do so.  Your doctor may tell you to do one or more of the following:  Pick up the gauze. Pour the salt solution over the gauze. Squeeze out the extra salt solution.  Put medicated cream or other medicine on your wound.  Put solution soaked gauze only in your wound, not on the skin around it.  Pack your wound loosely.  Put dry gauze on your wound.  Put belly pads over the dry gauze if your bandages soak through.  Tape the bandage in place so it will not fall off. Do not wrap the tape all the way around your arm or leg.  Wrap the bandage with the flexible gauze bandage as told by your doctor.  Take off your gloves. Put them in the plastic bag with the old bandage. Tie the bag shut and throw  it away.  Keep the bandage clean and dry.  Wash your hands. GET HELP RIGHT AWAY IF:   Your skin around the wound looks red.  Your wound feels more tender or sore.  You see yellowish-white fluid (pus) in the wound.  Your wound smells bad.  You have a fever.  Your skin around the wound has a red rash that itches and burns.  You see black or yellow skin in your wound that was not there before.  You feel sick to your stomach (nauseous), throw up (vomit), and feel very tired.   This information is not intended to replace advice given to you by your health care provider. Make sure you discuss any questions you have with your health care provider.   Document Released: 06/01/2008 Document Revised: 03/26/2014 Document Reviewed: 01/14/2011 Elsevier Interactive Patient Education 2016 Else[employer]  Abscess An abscess (boil or furuncle) is an infected area on or under the skin. This area is filled with yellowish-white fluid (pus) and other material (debris). HOME CARE   Only take medicines as told by your doctor.  If you were given antibiotic medicine, take it as directed. Finish the medicine even if you start to feel better.  If gauze is used, follow your doctor's directions for changing the gauze.  To avoid spreading the infection:  Keep your abscess covered with a bandage.  Wash your hands well.  Do not share personal care items, towels, or whirlpools with others.  Avoid skin contact with others.  Keep your skin and clothes clean around the abscess.  Keep all doctor visits as told. GET HELP RIGHT AWAY IF:   You have more pain, puffiness (swelling), or redness in the wound site.  You have more fluid or blood coming from the wound site.  You have muscle aches, chills, or you feel sick.  You have a fever. MAKE SURE YOU:   Understand these instructions.  Will watch your condition.  Will get help right away if you are not doing well or get worse.   This  information is not intended to replace advice given to you by your health care provider. Make sure you discuss any questions you have with your health care provider.   Document Released: 08/22/2007 Document Revised: 09/04/2011 Document Reviewed: 05/19/2011 Elsevier Interactive Patient Education 2016 [place] A dressing is a material placed over wounds. It keeps the wound clean, dry, and protected from further injury.  BEFORE YOU BEGIN  Get your supplies together. Things you may need include:  Salt solution (saline).  Flexible gauze bandage.  Medicated cream.  Tape.  Gloves.  Belly (abdominal) pads.  Gauze squares.  Plastic bags.  Take pain medicine 30 minutes before the bandage change if you need it.  Take a shower before you do the first bandage change of the day. Put plastic wrap or a bag over the dressing. REMOVING YOUR OLD BANDAGE  Wash your hands with soap and water. Dry your hands with a clean towel.  Put on your gloves.  Remove any tape.  Remove the old bandage as told. If it sticks, put a small amount of warm water on it to loosen the bandage.  Remove any gauze or packing tape in your wound.  Take off your gloves.  Put the gloves, tape, gauze, or any packing tape in a plastic bag. CHANGING YOUR BANDAGE  Open the supplies.  Take the cap off the salt solution.  Open the gauze. Leave the gauze on the inside of the package.  Put on your gloves.  Clean your wound as told by your doctor.  Keep your wound dry if your doctor told you to do so.  Your doctor may tell you to do one or more of the following:  Pick up the gauze. Pour the salt solution over the gauze. Squeeze out the extra salt solution.  Put medicated cream or other medicine on your wound.  Put solution soaked gauze only in your wound, not on the skin around it.  Pack your wound loosely.  Put dry gauze on your wound.  Put belly pads over the dry gauze if your  bandages soak through.  Tape the bandage in place so it will not fall off. Do not wrap the tape all the way around your arm or leg.  Wrap the bandage with the flexible gauze bandage as told by your doctor.  Take off your gloves. Put them in the plastic bag with the old bandage. Tie the bag shut and throw it away.  Keep the bandage clean and dry.  Wash your hands. GET HELP RIGHT AWAY IF:   Your skin around the wound looks red.  Your wound feels more tender or sore.  You see yellowish-white fluid (pus) in the wound.  Your  wound smells bad.  You have a fever.  Your skin around the wound has a red rash that itches and burns.  You see black or yellow skin in your wound that was not there before.  You feel sick to your stomach (nauseous), throw up (vomit), and feel very tired.   This information is not intended to replace advice given to you by your health care provider. Make sure you discuss any questions you have with your health care provider.   Document Released: 06/01/2008 Document Revised: 03/26/2014 Document Reviewed: 01/14/2011 Elsevier Interactive Patient Education [employer].

## 2015-09-02 NOTE — Progress Notes (Signed)
*  PRELIMINARY RESULTS* Vascular Ultrasound Right upper extremity venous duplex has been completed.  Preliminary findings: No evidence of DVT or superficial thrombosis.  Landry Mellow, RDMS, RVT  09/02/2015, 11:02 AM

## 2015-09-02 NOTE — Progress Notes (Signed)
Subjective: Patient states that he's had increasing pain and swelling to the right wrist and forearm region earlier but at time of their evaluation today he states that he feels as though the swelling has improved since this morning. We were contacted by hospitalist earlier this morning as the patient seemingly had an increased amount of swelling about the right upper extremity. Patient states that the wrist and forearm ulnarly are tender. He denies fever, chills, nausea or vomiting at this juncture. He is tolerating a regular diet. His a wrist brace has previously been removed. Preliminary reading of the ultrasound showed no obvious DVT. Objective: Vital signs in last 24 hours: Temp:  [98.4 F (36.9 C)-99.8 F (37.7 C)] 98.4 F (36.9 C) (06/16 1350) Pulse Rate:  [91-95] 92 (06/16 1350) Resp:  [15-18] 16 (06/16 1350) BP: (124-177)/(81-88) 129/88 mmHg (06/16 1350) SpO2:  [98 %-100 %] 100 % (06/16 1350)  Intake/Output from previous day: 06/15 0701 - 06/16 0700 In: 1800 [P.O.:600; I.V.:1200] Out: 2825 [Urine:2825] Intake/Output this shift: Total I/O In: 1470 [P.O.:720; I.V.:450; IV Piggyback:300] Out: 1300 [Urine:1300]   Recent Labs  08/30/15 2307 08/31/15 1020 09/02/15 0951  HGB 14.8 13.0 13.0    Recent Labs  08/31/15 1020 09/02/15 0951  WBC 15.8* 16.1*  RBC 4.39 4.35  HCT 36.6* 35.8*  PLT 257 266    Recent Labs  09/01/15 0446 09/02/15 0951  NA 134* 134*  K 3.2* 3.8  CL 100* 98*  CO2 27 26  BUN 8 8  CREATININE 0.58* 0.81  GLUCOSE 310* 275*  CALCIUM 8.5* 8.9   No results for input(s): LABPT, INR in the last 72 hours.  Examination of the right upper extremity shows that he has multiple small abrasions and furuncles about the upper extremity he has noted swelling about the volar ulnar aspect of his wrist extending proximally to the level of the medial epicondylar region there is mild erythema and warmth. He is tender to palpation primarily along the volar ulnar  aspect of his wrist to the forearm region. There is not a well-defined abscess at this juncture per palpatory examination or a specific area of fluctuance or induration but certainly appears to be cellulitic process and possible abscess declaring itself. His sensation is intact to the digits refill is intact to the digits. He is not overly painfil with active flexion or extension of the digits. Passive range of motion of the wrist is nontender. He is nontender about the elbow with passive or active range of motion. Radial pulse intact. No signs of compartment syndrome are present at this juncture  Assessment/Plan: Right wrist and forearm evolving cellulitis rule out deep abscess Patient Active Problem List   Diagnosis Date Noted  . Abscess of left shoulder 08/31/2015  . Uncontrolled diabetes mellitus ([place]) 08/31/2015  . Abscess 08/31/2015  I discussed with the patient and his family at recommendations proceed with MRI of the right wrist and forearm to rule out deep space abscess. We will certainly continue IV antibiotics close observation and based on his MRI findings the potential need for surgical endeavors. I have placed him in a long arm splint with admission sling for elevation purposes. Once his MRI is complete,  triple antibiotic ointment/post an appointment will be applied to the upper extremity given his skin condition and we will continue long arm splint for support to the upper extremity as well as elevation. I've discussed all issues with the patient and his family.    Reynol Arnone L 09/02/2015, 3:12 PM

## 2015-09-02 NOTE — Progress Notes (Signed)
Patient ID: [name] [name], male   DOB: Nov 25, 1985, 30 y.o.   MRN: [id number] MRI reveals a fluid collection in the FCU area I have reviewed (tonite) the scan though no radiology read is available Given the worsening pain I feel I&D would be most prudent to decompress the fluid which I suspect is hematagenous seeding of a hematoma Will plan for I&D tomorrow Patient aware [name] MD

## 2015-09-02 NOTE — Plan of Care (Signed)
Problem: Skin Integrity: Goal: Risk for impaired skin integrity will decrease Outcome: Progressing Patient 's girlfriend watched the dressing change.  Verbalized understanding  Of the reason for the dressing change.

## 2015-09-02 NOTE — Progress Notes (Signed)
PROGRESS NOTE    [name] [name]  [id number] DOB: 06/24/1985 DOA: 08/30/2015 PCP: No primary care provider on file.  Outpatient Specialists:   Brief Narrative: 31 y.o. male with history of DM, HTN and asthma. Patient reported being a diabetic since his early 20's, and was on oral diabetic medication at some point. Patient has not been taking medication for his insulin or hypertension due to financial challenges according to the patient. Patient developed boils about 3 weeks ago, and has had boils on 3 different occasions that he has self managed. Last boil has been persistent and has gotten significantly larger necessitating patient's visit to the ER. On presentation to the hospital, the blood sugar was found to be greater than 400, but BP was controlled. Patient also looked volume depleted. No fever or chills, no headache, no neck pain, no chest pain, no GI symptoms and no urinary symptoms. CBC revealed leukocytosis. CT scan revealed abscess and reactive lymphnode left axilla. Patient has been admitted by the Surgery team and I and D is planned. Patient had been fairly active, without limitations. Patient denied prior cardiac illness. As mentioned above, no chest pain and no shortness of breath with activity.   Patient was transferred to Hospitalist service post I and D by the surgical team. Blood sugar is still not controlled. Patient will need wound care, case management to assist with discharge plans and needs, as well as ability to provide medications for insulin. Right wrist swelling is noted and the Orthopedic (Hand) team has been consulted. Elevated BP noted post op.  Assessment & Plan:   Principal Problem:   Abscess of left shoulder Active Problems:   Uncontrolled diabetes mellitus (HCC)   Abscess  Swollen right upper extremity   Non compliane   Hypokalemia  - Will continue to control patient's blood sugar. Adjust insulin. - Increase [place] Lantus insulin to 15 Units twice daily - SSI  insulin coverage. - Doppler ultrasound Right upper extremity - HbA1c is greater than 13. - Case management consult - Continue hydration. - Wound care consult - Hand surgery (Orthopedic) consulted, awaiting input. - Monitor electrolytes and replete - Keep RUE elevated  DVT prophylaxis: [place] Lovenox Code Status: Full Family Communication:  Disposition Plan: Home eventually  Consultants:   General Surgery  Hand Surgery (Orthopedics)  Procedures:   I and D done on 08/31/2015.  Antimicrobials:   Vancomycin and Zosyn   Subjective: Worsening swelling RUE.  Objective: Filed Vitals:   09/01/15 0907 09/01/15 1459 09/01/15 2148 09/02/15 0506  BP: 170/90 139/95 177/81 124/82  Pulse: 85 88 95 91  Temp: 98.3 F (36.8 C) 98.6 F (37 C) 99.1 F (37.3 C) 99.8 F (37.7 C)  TempSrc: Oral Oral Oral Oral  Resp: 20 20 18 15   Height:      Weight:      SpO2: 99% 100% 100% 98%    Intake/Output Summary (Last 24 hours) at 09/02/15 0924 Last data filed at 09/02/15 0600  Gross per 24 hour  Intake   1440 ml  Output   2225 ml  Net   -785 ml   Filed Weights   08/31/15 0129  Weight: 102.059 kg (225 lb)    Examination:  Constitutional: Obese. Alert and awake, oriented x 3. Not in any acute distress. Eyes: No pallor or jaundice.  ENMT: Dry buccal Mucosa.  Neck: Neck is supple. No JVD.  CVS: S1-S2. Respiratory: clear to auscultation bilaterally, no wheezing,. Abdomen: Obese, soft and non tender.  Neuro: Awake and  alert. Patient moves all limbs. Extremities - RUE is swollen from the elbow downwards.  Data Reviewed: I have personally reviewed following labs.  CBC:  Recent Labs Lab 08/30/15 2307 08/31/15 1020  WBC 16.9* 15.8*  NEUTROABS 13.4*  --   HGB 14.8 13.0  HCT 40.5 36.6*  MCV 82.0 83.4  PLT 273 99991111   Basic Metabolic Panel:  Recent Labs Lab 08/30/15 2307 08/31/15 1020 09/01/15 0446  NA 131*  --  134*  K 3.9  --  3.2*  CL 93*  --  100*  CO2  29  --  27  GLUCOSE 420*  --  310*  BUN 9  --  8  CREATININE 0.81 0.65 0.58*  CALCIUM 9.3  --  8.5*  MG  --  1.9  --   PHOS  --  3.6  --    GFR: Estimated Creatinine Clearance: 155.1 mL/min (by C-G formula based on Cr of 0.58). Liver Function Tests:  Recent Labs Lab 08/30/15 2307 09/01/15 0446  AST 12* 16  ALT 18 15*  ALKPHOS 95 87  BILITOT 0.7 0.9  PROT 7.7 6.0*  ALBUMIN 3.8 2.9*   No results for input(s): LIPASE, AMYLASE in the last 168 hours. No results for input(s): AMMONIA in the last 168 hours. Coagulation Profile: No results for input(s): INR, PROTIME in the last 168 hours. Cardiac Enzymes: No results for input(s): CKTOTAL, CKMB, CKMBINDEX, TROPONINI in the last 168 hours. BNP (last 3 results) No results for input(s): PROBNP in the last 8760 hours. HbA1C:  Recent Labs  08/31/15 1020  HGBA1C 13.7*   CBG:  Recent Labs Lab 08/31/15 2149 09/01/15 0818 09/01/15 1151 09/01/15 1733 09/01/15 2145  GLUCAP 216* 307* 355* 252* 242*   Lipid Profile: No results for input(s): CHOL, HDL, LDLCALC, TRIG, CHOLHDL, LDLDIRECT in the last 72 hours. Thyroid Function Tests:  Recent Labs  08/31/15 1020  TSH 0.901   Anemia Panel: No results for input(s): VITAMINB12, FOLATE, FERRITIN, TIBC, IRON, RETICCTPCT in the last 72 hours. Urine analysis: No results found for: COLORURINE, APPEARANCEUR, LABSPEC, PHURINE, GLUCOSEU, HGBUR, BILIRUBINUR, KETONESUR, PROTEINUR, UROBILINOGEN, NITRITE, LEUKOCYTESUR Sepsis Labs: @LABRCNTIP (procalcitonin:4,lacticidven:4)  ) Recent Results (from the past 240 hour(s))  Culture, blood (Routine X 2) w Reflex to ID Panel     Status: None (Preliminary result)   Collection Time: 08/31/15  1:03 AM  Result Value Ref Range Status   Specimen Description BLOOD RIGHT ANTECUBITAL  Final   Special Requests IN PEDIATRIC BOTTLE 4ML  Final   Culture   Final    NO GROWTH 1 DAY Performed at [facility]    Report Status PENDING  Incomplete    Culture, blood (Routine X 2) w Reflex to ID Panel     Status: None (Preliminary result)   Collection Time: 08/31/15  1:11 AM  Result Value Ref Range Status   Specimen Description BLOOD LEFT ANTECUBITAL  Final   Special Requests BOTTLES DRAWN AEROBIC AND ANAEROBIC 5ML  Final   Culture   Final    NO GROWTH 1 DAY Performed at [facility]    Report Status PENDING  Incomplete  Surgical pcr screen     Status: None   Collection Time: 08/31/15 11:00 AM  Result Value Ref Range Status   MRSA, PCR NEGATIVE NEGATIVE Final   Staphylococcus aureus NEGATIVE NEGATIVE Final    Comment:        The Xpert SA Assay (FDA approved for NASAL specimens in patients over 21 years of  age), is one component of a comprehensive surveillance program.  Test performance has been validated by [facility] for patients greater than or equal to 19 year old. It is not intended to diagnose infection nor to guide or monitor treatment.   Aerobic/Anaerobic Culture (surgical/deep wound)     Status: None (Preliminary result)   Collection Time: 08/31/15 12:40 PM  Result Value Ref Range Status   Specimen Description ABSCESS LEFT SHOULDER IRRIGATION & DEBRIDEMENT  Final   Special Requests NONE  Final   Gram Stain   Final    ABUNDANT WBC PRESENT, PREDOMINANTLY PMN MODERATE GRAM POSITIVE COCCI IN CLUSTERS IN PAIRS    Culture   Final    CULTURE REINCUBATED FOR BETTER GROWTH Performed at [facility]    Report Status PENDING  Incomplete         Radiology Studies: No results found.      Scheduled Meds: . enoxaparin (LOVENOX) injection  50 mg Subcutaneous Q24H  . insulin aspart  0-15 Units Subcutaneous TID WC  . insulin aspart  0-5 Units Subcutaneous QHS  . insulin aspart  6 Units Subcutaneous TID WC  . insulin glargine  10 Units Subcutaneous BID  . piperacillin-tazobactam (ZOSYN)  IV  3.375 g Intravenous Q8H  . vancomycin  1,250 mg Intravenous Q8H   Continuous Infusions: . sodium  chloride 100 mL/hr at 09/01/15 2121     LOS: 2 days    Time spent: 35 Minutes.    [name] Allan, MD  [facility] Pager #: [phone] 7PM-7AM contact night coverage as above

## 2015-09-02 NOTE — Progress Notes (Signed)
Marcie Bal, MD paged for patient elevated blood sugar level of 452. Awaiting call back.  Patient asymptomatic. Will continue to monitor.

## 2015-09-02 NOTE — Plan of Care (Signed)
Problem: Food- and Nutrition-Related Knowledge Deficit (NB-1.1) Goal: Nutrition education Formal process to instruct or train a patient/client in a skill or to impart knowledge to help patients/clients voluntarily manage or modify food choices and eating behavior to maintain or improve health. Outcome: Completed/Met Date Met:  09/02/15  RD consulted for nutrition education regarding diabetes.     Lab Results  Component Value Date    HGBA1C 13.7* 08/31/2015    RD provided "Carbohydrate Counting for People with Diabetes" handout from the Academy of Nutrition and Dietetics. Discussed different food groups and their effects on blood sugar, emphasizing carbohydrate-containing foods. Provided list of carbohydrates and recommended serving sizes of common foods.  Discussed importance of controlled and consistent carbohydrate intake throughout the day. Provided examples of ways to balance meals/snacks and encouraged intake of high-fiber, whole grain complex carbohydrates. Teach back method used.  Expect fair compliance.  Body mass index is 35.23 kg/(m^2). Pt meets criteria for obese class II based on current BMI.  Current diet order is carb modified, patient is consuming approximately 100% of meals at this time. Labs and medications reviewed. No further nutrition interventions warranted at this time. RD contact information provided. If additional nutrition issues arise, please re-consult RD.  Satira Anis. Maccoy Haubner, MS, RD LDN Inpatient Clinical Dietitian Pager [phone]

## 2015-09-02 NOTE — Progress Notes (Signed)
Inpatient Diabetes Program Recommendations  AACE/ADA: New Consensus Statement on Inpatient Glycemic Control (2015)  Target Ranges:  Prepandial:   less than 140 mg/dL      Peak postprandial:   less than 180 mg/dL (1-2 hours)      Critically ill patients:  140 - 180 mg/dL   Results for [name] [name], [name] [name] (MRN [id number]) as of 09/02/2015 10:08  Ref. Range 09/01/2015 08:18 09/01/2015 11:51 09/01/2015 17:33 09/01/2015 21:45  Glucose-Capillary Latest Ref Range: 65-99 mg/dL 307 (H) 355 (H) 252 (H) 242 (H)   Results for [name] [name], [name] [name] (MRN [id number]) as of 09/02/2015 10:08  Ref. Range 08/31/2015 10:20  Hemoglobin A1C Latest Ref Range: 4.8-5.6 % 13.7 (H)    Admit with: Abscess Shoulder/ Hyperglycemia  History: DM (since age 30)  Home DM Meds: None  Current Insulin Orders: Lantus 15 units bid  Novolog Moderate Correction Scale/ SSI (0-15 units) TID AC + HS  Novolog 6 units tidwc     -Spoke with patient yesterday about his A1c and the importance of good glucose control at home (see DM Coordinator note from 06/15).  -Note Lantus increased today.    MD- Not sure patient will be able to afford Lantus at time of discharge.  May want to consider switching patient to 70/30 Insulin bidwc at some point for cost considerations and ease of regimen.  Not sure patient will be adherent to 4 shots of insulin per day at home with Lantus/Novolog regimen and he will likely not be able to afford Lantus and Novolog.  Patient can purchase Reli-On 70/30 insulin from [facility] for $25 per vial (Order [id number]).  Would also restart Metformin for this patient at time of discharge as well (Metformin 500 mg bid).  Patient can purchase Metformin at [facility] for $4.  If you decide to convert patient over to 70/30 Insulin, would start with 25 units bidwc.      --Will follow patient during hospitalization--  Wyn Quaker RN, MSN,  CDE Diabetes Coordinator Inpatient Glycemic Control Team Team Pager: [phone] (8a-5p)

## 2015-09-02 NOTE — Progress Notes (Signed)
2 Days Post-Op  Subjective: His shoulder looks fine, Right arm is significantly swollen and needs to be seen.  I am having girlfriend do dressing change today.    Objective: Vital signs in last 24 hours: Temp:  [98.3 F (36.8 C)-99.8 F (37.7 C)] 99.8 F (37.7 C) (06/16 0506) Pulse Rate:  [85-95] 91 (06/16 0506) Resp:  [15-20] 15 (06/16 0506) BP: (124-177)/(81-95) 124/82 mmHg (06/16 0506) SpO2:  [98 %-100 %] 98 % (06/16 0506)  600 PO Urine, 2825 Afebrile, VSS No labs GM + cocci  On gram stain, no culture results yet Glucose still in the 240-300 range. Intake/Output from previous day: 06/15 0701 - 06/16 0700 In: 1800 [P.O.:600; I.V.:1200] Out: 2825 [Urine:2825] Intake/Output this shift:    General appearance: alert, cooperative and no distress Incision/Wound:  Open wound looks fine, wet to dry dressing BID.   Right arm and hand with significant swelling, will discuss with Medicine.   Lab Results:   Recent Labs  08/30/15 2307 08/31/15 1020  WBC 16.9* 15.8*  HGB 14.8 13.0  HCT 40.5 36.6*  PLT 273 257    BMET  Recent Labs  08/30/15 2307 08/31/15 1020 09/01/15 0446  NA 131*  --  134*  K 3.9  --  3.2*  CL 93*  --  100*  CO2 29  --  27  GLUCOSE 420*  --  310*  BUN 9  --  8  CREATININE 0.81 0.65 0.58*  CALCIUM 9.3  --  8.5*   PT/INR No results for input(s): LABPROT, INR in the last 72 hours.   Recent Labs Lab 08/30/15 2307 09/01/15 0446  AST 12* 16  ALT 18 15*  ALKPHOS 95 87  BILITOT 0.7 0.9  PROT 7.7 6.0*  ALBUMIN 3.8 2.9*     Lipase  No results found for: LIPASE   Studies/Results: No results found.  Medications: . enoxaparin (LOVENOX) injection  50 mg Subcutaneous Q24H  . insulin aspart  0-15 Units Subcutaneous TID WC  . insulin aspart  0-5 Units Subcutaneous QHS  . insulin aspart  6 Units Subcutaneous TID WC  . insulin glargine  10 Units Subcutaneous BID  . piperacillin-tazobactam (ZOSYN)  IV  3.375 g Intravenous Q8H  . vancomycin   1,250 mg Intravenous Q8H    Assessment/Plan Abcess left shoulder (6 x 12 cm)  IRRIGATION AND DEBRIDEMENT ABSCESS LEFT SHOULDER, 08/31/15, Dr. Lucia [name] Uncontrolled diabetes  Glucose - 275 Swollen right wrist - seen by Dr. Amedeo [name] in ER.  FEN: Carb modified ID: Day 3 Zosyn and Vancomycin DVT: Lovenox/SCD   Plan:  Let Medicine know about his right hand and arm. I have called Dr. Amedeo [name] 's office.   Continue wet to dry dressings to shoulder.  He can shower from our standpoint and get soap and water into the site.  Home when medically stable.    LOS: 2 days    [name],[name] 09/02/2015 [phone]  Agree with above.  His right arm is maybe better.  Will has touched base with [name] [name] office for follow up.  Now has a small area on the right back, almost a mirror image of the left back abscess.  [name] Overall, MD, [name] Pager: [phone] Office phone:  [phone]

## 2015-09-02 NOTE — Plan of Care (Signed)
Problem: Education: Goal: Ability to describe self-care measures that may prevent or decrease complications (Diabetes Survival Skills Education) will improve Outcome: Completed/Met Date Met:  09/02/15 Patient returned proper demonstration in self injecting his insulin.

## 2015-09-03 ENCOUNTER — Inpatient Hospital Stay (HOSPITAL_COMMUNITY): Payer: Self-pay | Admitting: Registered Nurse

## 2015-09-03 ENCOUNTER — Encounter (HOSPITAL_COMMUNITY)
Admission: EM | Disposition: A | Discharge status: Home Health | Payer: Self-pay | Source: Home / Self Care | Attending: Internal Medicine

## 2015-09-03 HISTORY — PX: INCISION AND DRAINAGE ABSCESS: SHX5864

## 2015-09-03 LAB — CBC
HEMATOCRIT: 36.1 % — AB (ref 39.0–52.0)
HEMOGLOBIN: 12.9 g/dL — AB (ref 13.0–17.0)
MCH: 29.7 pg (ref 26.0–34.0)
MCHC: 35.7 g/dL (ref 30.0–36.0)
MCV: 83 fL (ref 78.0–100.0)
Platelets: 289 10*3/uL (ref 150–400)
RBC: 4.35 MIL/uL (ref 4.22–5.81)
RDW: 12.2 % (ref 11.5–15.5)
WBC: 15.8 10*3/uL — ABNORMAL HIGH (ref 4.0–10.5)

## 2015-09-03 LAB — BASIC METABOLIC PANEL
ANION GAP: 8 (ref 5–15)
BUN: 6 mg/dL (ref 6–20)
CHLORIDE: 105 mmol/L (ref 101–111)
CO2: 26 mmol/L (ref 22–32)
CREATININE: 0.93 mg/dL (ref 0.61–1.24)
Calcium: 9 mg/dL (ref 8.9–10.3)
GFR calc non Af Amer: >60 mL/min (ref >60)
GLUCOSE: 206 mg/dL — AB (ref 65–99)
Potassium: 3.6 mmol/L (ref 3.5–5.1)
Sodium: 139 mmol/L (ref 135–145)

## 2015-09-03 LAB — GLUCOSE, CAPILLARY
Glucose-Capillary: 216 mg/dL — ABNORMAL HIGH (ref 65–99)
Glucose-Capillary: 211 mg/dL — ABNORMAL HIGH (ref 65–99)
Glucose-Capillary: 167 mg/dL — ABNORMAL HIGH (ref 65–99)
Glucose-Capillary: 236 mg/dL — ABNORMAL HIGH (ref 65–99)
Glucose-Capillary: 293 mg/dL — ABNORMAL HIGH (ref 65–99)

## 2015-09-03 SURGERY — INCISION AND DRAINAGE, ABSCESS
Anesthesia: General | Laterality: Right

## 2015-09-03 MED ORDER — MIDAZOLAM HCL 2 MG/2ML IJ SOLN
INTRAMUSCULAR | Status: AC
  Filled 2015-09-03: qty 2

## 2015-09-03 MED ORDER — MEPERIDINE HCL 50 MG/ML IJ SOLN: 6.2500 mg | INTRAMUSCULAR | Status: DC | PRN

## 2015-09-03 MED ORDER — INSULIN GLARGINE 100 UNIT/ML ~~LOC~~ SOLN
30.0000 [IU] | Freq: Two times a day (BID) | SUBCUTANEOUS | Status: DC
  Administered 2015-09-03: 30 [IU] via SUBCUTANEOUS
  Filled 2015-09-03 (×2): qty 0.3

## 2015-09-03 MED ORDER — LIDOCAINE HCL (CARDIAC) 20 MG/ML IV SOLN
INTRAVENOUS | Status: DC | PRN
  Administered 2015-09-03: 100 mg via INTRAVENOUS

## 2015-09-03 MED ORDER — INSULIN ASPART 100 UNIT/ML ~~LOC~~ SOLN
0.0000 [IU] | Freq: Every day | SUBCUTANEOUS | Status: DC
  Administered 2015-09-03: 3 [IU] via SUBCUTANEOUS
  Administered 2015-09-10: 2 [IU] via SUBCUTANEOUS
  Filled 2015-09-03 (×2): qty 1

## 2015-09-03 MED ORDER — PROPOFOL 10 MG/ML IV BOLUS
INTRAVENOUS | Status: AC
  Filled 2015-09-03: qty 20

## 2015-09-03 MED ORDER — FENTANYL CITRATE (PF) 100 MCG/2ML IJ SOLN
INTRAMUSCULAR | Status: AC
  Filled 2015-09-03: qty 2

## 2015-09-03 MED ORDER — ONDANSETRON HCL 4 MG/2ML IJ SOLN
INTRAMUSCULAR | Status: AC
  Filled 2015-09-03: qty 2

## 2015-09-03 MED ORDER — FENTANYL CITRATE (PF) 100 MCG/2ML IJ SOLN
INTRAMUSCULAR | Status: DC | PRN
  Administered 2015-09-03 (×2): 100 ug via INTRAVENOUS

## 2015-09-03 MED ORDER — ONDANSETRON HCL 4 MG/2ML IJ SOLN
4.0000 mg | Freq: Four times a day (QID) | INTRAMUSCULAR | Status: DC | PRN
  Administered 2015-09-03 – 2015-09-07 (×4): 4 mg via INTRAVENOUS
  Filled 2015-09-03 (×4): qty 2

## 2015-09-03 MED ORDER — ONDANSETRON HCL 4 MG/2ML IJ SOLN
INTRAMUSCULAR | Status: DC | PRN
  Administered 2015-09-03: 4 mg via INTRAVENOUS

## 2015-09-03 MED ORDER — PROMETHAZINE HCL 25 MG/ML IJ SOLN
12.5000 mg | Freq: Four times a day (QID) | INTRAMUSCULAR | Status: DC | PRN
  Administered 2015-09-03 – 2015-09-07 (×2): 12.5 mg via INTRAVENOUS
  Filled 2015-09-03 (×2): qty 1

## 2015-09-03 MED ORDER — INSULIN ASPART 100 UNIT/ML ~~LOC~~ SOLN
3.0000 [IU] | Freq: Three times a day (TID) | SUBCUTANEOUS | Status: DC
  Administered 2015-09-04 – 2015-09-11 (×18): 3 [IU] via SUBCUTANEOUS
  Filled 2015-09-03 (×11): qty 1

## 2015-09-03 MED ORDER — MIDAZOLAM HCL 5 MG/5ML IJ SOLN
INTRAMUSCULAR | Status: DC | PRN
  Administered 2015-09-03: 2 mg via INTRAVENOUS

## 2015-09-03 MED ORDER — LACTATED RINGERS IV SOLN
INTRAVENOUS | Status: DC | PRN
  Administered 2015-09-03 (×2): via INTRAVENOUS

## 2015-09-03 MED ORDER — INSULIN ASPART 100 UNIT/ML ~~LOC~~ SOLN
0.0000 [IU] | Freq: Three times a day (TID) | SUBCUTANEOUS | Status: DC
  Administered 2015-09-03 (×2): 7 [IU] via SUBCUTANEOUS
  Administered 2015-09-04: 11 [IU] via SUBCUTANEOUS
  Administered 2015-09-04: 7 [IU] via SUBCUTANEOUS
  Administered 2015-09-04 – 2015-09-05 (×3): 3 [IU] via SUBCUTANEOUS
  Administered 2015-09-06 – 2015-09-09 (×7): 4 [IU] via SUBCUTANEOUS
  Administered 2015-09-10: 7 [IU] via SUBCUTANEOUS
  Filled 2015-09-03 (×8): qty 1

## 2015-09-03 MED ORDER — INSULIN GLARGINE 100 UNIT/ML ~~LOC~~ SOLN
38.0000 [IU] | Freq: Two times a day (BID) | SUBCUTANEOUS | Status: DC
  Administered 2015-09-03 – 2015-09-07 (×8): 38 [IU] via SUBCUTANEOUS
  Filled 2015-09-03 (×9): qty 0.38

## 2015-09-03 MED ORDER — PROPOFOL 10 MG/ML IV BOLUS
INTRAVENOUS | Status: DC | PRN
  Administered 2015-09-03: 200 mg via INTRAVENOUS

## 2015-09-03 MED ORDER — SODIUM CHLORIDE 0.9 % IR SOLN
Status: DC | PRN
  Administered 2015-09-03: 500 mL

## 2015-09-03 MED ORDER — HYDROMORPHONE HCL 1 MG/ML IJ SOLN
INTRAMUSCULAR | Status: AC
  Filled 2015-09-03: qty 1

## 2015-09-03 MED ORDER — SODIUM CHLORIDE 0.9 % IR SOLN
Status: AC
  Filled 2015-09-03: qty 1

## 2015-09-03 MED ORDER — PROMETHAZINE HCL 25 MG/ML IJ SOLN: 6.2500 mg | INTRAMUSCULAR | Status: DC | PRN

## 2015-09-03 MED ORDER — BUPIVACAINE-EPINEPHRINE 0.25% -1:200000 IJ SOLN
INTRAMUSCULAR | Status: AC
  Filled 2015-09-03: qty 1

## 2015-09-03 MED ORDER — BUPIVACAINE-EPINEPHRINE (PF) 0.5% -1:200000 IJ SOLN
INTRAMUSCULAR | Status: AC
  Filled 2015-09-03: qty 30

## 2015-09-03 MED ORDER — LIDOCAINE HCL (CARDIAC) 20 MG/ML IV SOLN
INTRAVENOUS | Status: AC
  Filled 2015-09-03: qty 5

## 2015-09-03 MED ORDER — HYDROMORPHONE HCL 1 MG/ML IJ SOLN
0.2500 mg | INTRAMUSCULAR | Status: DC | PRN
  Administered 2015-09-03 (×4): 0.5 mg via INTRAVENOUS

## 2015-09-03 MED ORDER — ACETAMINOPHEN 10 MG/ML IV SOLN: INTRAVENOUS | Status: DC | PRN

## 2015-09-03 SURGICAL SUPPLY — 35 items
BANDAGE ACE 4X5 VEL STRL LF (GAUZE/BANDAGES/DRESSINGS) ×3 IMPLANT
BNDG ELASTIC 2X5.8 VLCR STR LF (GAUZE/BANDAGES/DRESSINGS) ×3 IMPLANT
BNDG GAUZE ELAST 4 BULKY (GAUZE/BANDAGES/DRESSINGS) IMPLANT
CUFF TOURN SGL QUICK 18 (TOURNIQUET CUFF) ×3 IMPLANT
DRAIN PENROSE 18X1/4 LTX STRL (WOUND CARE) IMPLANT
DRSG EMULSION OIL 3X3 NADH (GAUZE/BANDAGES/DRESSINGS) ×3 IMPLANT
DRSG PAD ABDOMINAL 8X10 ST (GAUZE/BANDAGES/DRESSINGS) IMPLANT
ELECT REM PT RETURN 9FT ADLT (ELECTROSURGICAL) ×3
ELECTRODE REM PT RTRN 9FT ADLT (ELECTROSURGICAL) ×1 IMPLANT
GAUZE SPONGE 4X4 12PLY STRL (GAUZE/BANDAGES/DRESSINGS) IMPLANT
GAUZE SPONGE 4X4 16PLY XRAY LF (GAUZE/BANDAGES/DRESSINGS) ×3 IMPLANT
GLOVE BIO SURGEON STRL SZ8 (GLOVE) ×3 IMPLANT
GOWN STRL REUS W/TWL XL LVL3 (GOWN DISPOSABLE) ×3 IMPLANT
KIT BASIN OR (CUSTOM PROCEDURE TRAY) ×3 IMPLANT
LOOP VESSEL MAXI BLUE (MISCELLANEOUS) ×3 IMPLANT
MANIFOLD NEPTUNE II (INSTRUMENTS) ×3 IMPLANT
PACK ORTHO EXTREMITY (CUSTOM PROCEDURE TRAY) ×3 IMPLANT
PADDING CAST ABS 4INX4YD NS (CAST SUPPLIES) ×2
PADDING CAST ABS 6INX4YD NS (CAST SUPPLIES) ×2
PADDING CAST ABS COTTON 4X4 ST (CAST SUPPLIES) ×1 IMPLANT
PADDING CAST ABS COTTON 6X4 NS (CAST SUPPLIES) ×1 IMPLANT
POSITIONER SURGICAL ARM (MISCELLANEOUS) ×3 IMPLANT
SOL PREP POV-IOD 4OZ 10% (MISCELLANEOUS) ×3 IMPLANT
SOL PREP PROV IODINE SCRUB 4OZ (MISCELLANEOUS) ×3 IMPLANT
SPLINT FIBERGLASS 4X30 (CAST SUPPLIES) ×3 IMPLANT
SUT PROLENE 3 0 PS 2 (SUTURE) IMPLANT
SUT VIC AB 1 CT1 27 (SUTURE)
SUT VIC AB 1 CT1 27XBRD ANTBC (SUTURE) IMPLANT
SUT VIC AB 2-0 CT1 27 (SUTURE)
SUT VIC AB 2-0 CT1 27XBRD (SUTURE) IMPLANT
SWAB COLLECTION DEVICE MRSA (MISCELLANEOUS) IMPLANT
SWAB CULTURE ESWAB REG 1ML (MISCELLANEOUS) IMPLANT
SYR 20CC LL (SYRINGE) ×3 IMPLANT
SYR CONTROL 10ML LL (SYRINGE) IMPLANT
TOWEL OR 17X26 10 PK STRL BLUE (TOWEL DISPOSABLE) ×3 IMPLANT

## 2015-09-03 NOTE — Progress Notes (Signed)
Tried to see patient twice today but unfortunately he was in the OR. Will evaluate in AM. Adjust insulin to control sugars.   Debbe Odea, MD

## 2015-09-03 NOTE — Transfer of Care (Signed)
Immediate Anesthesia Transfer of Care Note  Patient: [name] [name]  Procedure(s) Performed: Procedure(s): INCISION AND DRAINAGE ABSCESS (Right)  Patient Location: PACU  Anesthesia Type:General  Level of Consciousness: awake, alert , oriented and patient cooperative  Airway & Oxygen Therapy: Patient Spontanous Breathing and Patient connected to face mask oxygen  Post-op Assessment: Report given to RN, Post -op Vital signs reviewed and stable and Patient moving all extremities X 4  Post vital signs: stable  Last Vitals:  Filed Vitals:   09/03/15 0920 09/03/15 1100  BP: 146/76   Pulse: 91 86  Temp: 36.8 C 36.8 C  Resp: 18     Last Pain:  Filed Vitals:   09/03/15 1105  PainSc: 3       Patients Stated Pain Goal: 3 ([id number])  Complications: No apparent anesthesia complications

## 2015-09-03 NOTE — Plan of Care (Signed)
Problem: Skin Integrity: Goal: Risk for impaired skin integrity will decrease Outcome: Progressing Family/Significant other did not show any interest in watching the dressing change.

## 2015-09-03 NOTE — Anesthesia Preprocedure Evaluation (Signed)
Anesthesia Evaluation  Patient identified by MRN, date of birth, ID band Patient awake    Reviewed: Allergy & Precautions, NPO status , Patient's Chart, lab work & pertinent test results  Airway Mallampati: I  TM Distance: >3 FB     Dental  (+) Dental Advisory Given   Pulmonary asthma ,    breath sounds clear to auscultation       Cardiovascular hypertension,  Rhythm:Regular Rate:Normal     Neuro/Psych negative neurological ROS     GI/Hepatic negative GI ROS, Neg liver ROS,   Endo/Other  diabetes, Poorly Controlled, Type 2Morbid obesity  Renal/GU negative Renal ROS     Musculoskeletal   Abdominal   Peds  Hematology negative hematology ROS (+)   Anesthesia Other Findings   Reproductive/Obstetrics                             Lab Results  Component Value Date   WBC 16.1* 09/02/2015   HGB 13.0 09/02/2015   HCT 35.8* 09/02/2015   MCV 82.3 09/02/2015   PLT 266 09/02/2015   Lab Results  Component Value Date   CREATININE 0.81 09/02/2015   BUN 8 09/02/2015   NA 134* 09/02/2015   K 3.8 09/02/2015   CL 98* 09/02/2015   CO2 26 09/02/2015    Anesthesia Physical  Anesthesia Plan  ASA: II  Anesthesia Plan: General   Post-op Pain Management:    Induction: Intravenous  Airway Management Planned: LMA  Additional Equipment:   Intra-op Plan:   Post-operative Plan: Extubation in OR  Informed Consent: I have reviewed the patients History and Physical, chart, labs and discussed the procedure including the risks, benefits and alternatives for the proposed anesthesia with the patient or authorized representative who has indicated his/her understanding and acceptance.   Dental advisory given  Plan Discussed with: CRNA  Anesthesia Plan Comments:         Anesthesia Quick Evaluation

## 2015-09-03 NOTE — Op Note (Signed)
See [id number] Status post irrigation debridement deep abscess. This patient had an infected hematoma about his FCU muscle. This was aggressively debrided. I did perform an ulnar nerve release and ulnar artery exploration with additional fasciectomy of the forearm.   We will recommend continued IV antibiotics and await the culture results.  I spoke to nursing staff and would recommend that he bathe daily, apply Neosporin to the skin excoriations throughout the extremities and I discussed with he and his wife taking more of a proactive role in his health.  Will continue observatory care of the right wrist and forearm.  Elenor Wildes M.D.

## 2015-09-03 NOTE — Anesthesia Procedure Notes (Signed)
Procedure Name: Intubation Date/Time: 09/03/2015 10:05 AM Performed by: Lissa Morales Pre-anesthesia Checklist: Patient identified, Emergency Drugs available, Suction available and Patient being monitored Patient Re-evaluated:Patient Re-evaluated prior to inductionOxygen Delivery Method: Circle system utilized Preoxygenation: Pre-oxygenation with 100% oxygen Intubation Type: IV induction Ventilation: Mask ventilation without difficulty LMA: LMA with gastric port inserted LMA Size: 5.0 Laryngoscope size: stomach decompressed  with sump thru LMA  port. Tube type: Oral Number of attempts: 1 Airway Equipment and Method: Stylet and Oral airway Placement Confirmation: ETT inserted through vocal cords under direct vision,  positive ETCO2 and breath sounds checked- equal and bilateral Tube secured with: Tape Dental Injury: Teeth and Oropharynx as per pre-operative assessment

## 2015-09-03 NOTE — Op Note (Signed)
[name] [name], [name] [name] NO.:  [id number]  MEDICAL RECORD NO.:  [id number]  LOCATION:  [id number]                         FACILITY:  [facility]  PHYSICIAN:  Satira Anis. Karma Ansley, M.D.DATE OF BIRTH:  02/18/1986  DATE OF PROCEDURE: DATE OF DISCHARGE:                              OPERATIVE REPORT   PREOPERATIVE DIAGNOSIS: 1. History of left shoulder/posterior back abscess drained by General     surgery. 2. History of multiple skin excoriations about the arms and legs. 3. History of trauma to the right distal forearm and wrist 1 week ago     with continued pain, swelling and findings of fluid about the FCU,     distal musculotendinous region.  POSTOPERATIVE DIAGNOSIS:  Infected hematoma of FCU muscle belly, right forearm.  PROCEDURE: 1. Irrigation and debridement of deep abscess, right forearm. 2. Fasciotomy deep and superficial volar compartments, right forearm     with fascial excision. 3. Ulnar nerve neurolysis and Guyon's canal release, right forearm. 4. Ulnar artery exploration and decompression right forearm.  SURGEON:  Satira Anis. Amedeo Plenty, M.D.  ASSISTANT:  None.  COMPLICATIONS:  None.  ANESTHESIA:  General.  TOURNIQUET TIME:  Less than an hour.  DRAINS:  One large JP drain.  FINDINGS:  This patient had an infected hematoma of the FCU muscle belly.  I feel he had significant trauma 1 week ago and given the infectious issues in his body seeded the muscle.  The patient had an infection about the muscle that appeared to be an infected hematoma. This was cultured and aggressively I and Ded as noted below.  OPERATIVE PROCEDURE:  The patient was seen by myself and Anesthesia, taken to the operative theater and underwent smooth induction of general anesthesia, prepped and draped in usual sterile fashion with a Hibiclens prescrubbed by myself, followed by 10 minutes surgical Betadine scrub by the operative staff.  Following this time-out was called.  Arm was elevated,  tourniquet was insufflated and the patient underwent a curvilinear incision volar ulnarly at the point of maximum pain.  The patient has had an MRI scan which I read myself.  I noted fluid in this region and this was certainly what I considered the source of his pain. I do not have the official radiology read feel very comfortable with my personal read.  I dissected down very carefully, incised the fascia, identified the FCU muscle belly and noted infectious contents about the muscle.  This appeared to be an infected hematoma.  This was cultured for aerobic and anaerobic cultures.  Following this, I then performed a deep and superficial fasciotomy, fasciectomy about the volar forearm.  It is my opinion that given this patient's poor medical condition, diabetes issues, poor care issues, that he is likely a higher candidate to have problems healing than the general populace.  Thus, I performed a fasciectomy, fascial release of the superficial and volar compartments.  Following this, identified the ulnar nerve and artery.  The ulnar nerve and artery underwent a decompression.  The artery was traced out, decompressed along the fascial sheath.  The ulnar nerve was similarly decompressed as it coursed with the artery.  I performed a Guyon's canal proximal  release at the wrist region so there would be no undue tension on the neurovascular structures.  The carpal tunnel contents were not encroached upon in an aggressive fashion, but I did identify the median nerve and the flexor tendon, and did not see any obvious infectious fluid in the carpal region.  Following this, I irrigated with 3 L saline followed by an additional L of antibiotic fluid followed by placement of a JP drain.  The wound was closed with tourniquet deflated and hemostasis secured.  There were no complicating features. Going forward, I am going to hold his Lovenox.  He is ambulatory and at low risk for DVT in my opinion.   Given the issues I think that it would be in our best interest to make sure he is able to coagulate nicely.  We will continue suction to the JP drain.  We will continue close observatory care.  We will await cultures and move forward accordingly.  I discussed with his wife, I did have major concerns about all the areas in his body as he has so many multiple excoriations and other issues that make it is a bit nervous about his attention to detail with hygiene etc. We need to make sure that we do everything we can to try and eliminate further infectious sequelae for him.     Satira Anis. Amedeo Plenty, M.D.     [facility]  D:  09/03/2015  T:  09/03/2015  Job:  [id number]

## 2015-09-03 NOTE — Consult Note (Signed)
  Please see my note last evening  I would recommend exploration. He sustained trauma one week ago and certainly had a hematoma present. I feel that likely this hematoma has been seeded by bacteria and would recommend a formal debridement of the muscle.  He is stable awake alert and oriented. He does not have in tense cellulitis or erythema about the forearm.  I discussed with him all issues and plans and he is ready to proceed.  Oak Dorey M.D.

## 2015-09-03 NOTE — Progress Notes (Signed)
Dr.Gramig aware via phone pt nauseated and vomiting. See new orders received.

## 2015-09-03 NOTE — Anesthesia Postprocedure Evaluation (Signed)
Anesthesia Post Note  Patient: [name] [name]  Procedure(s) Performed: Procedure(s) (LRB): INCISION AND DRAINAGE ABSCESS (Right)  Patient location during evaluation: PACU Anesthesia Type: General Level of consciousness: sedated and patient cooperative Pain management: pain level controlled Vital Signs Assessment: post-procedure vital signs reviewed and stable Respiratory status: spontaneous breathing Cardiovascular status: stable Anesthetic complications: no    Last Vitals:  Filed Vitals:   09/03/15 1305 09/03/15 1427  BP: 157/82 159/77  Pulse: 79 89  Temp: 36.7 C 36.7 C  Resp: 15 18    Last Pain:  Filed Vitals:   09/03/15 1427  PainSc: [place]

## 2015-09-04 DIAGNOSIS — Z22322 Carrier or suspected carrier of Methicillin resistant Staphylococcus aureus: Secondary | ICD-10-CM

## 2015-09-04 DIAGNOSIS — L02511 Cutaneous abscess of right hand: Secondary | ICD-10-CM

## 2015-09-04 LAB — CBC
HEMATOCRIT: 32.7 % — AB (ref 39.0–52.0)
Hemoglobin: 11.1 g/dL — ABNORMAL LOW (ref 13.0–17.0)
MCH: 29.4 pg (ref 26.0–34.0)
MCHC: 33.9 g/dL (ref 30.0–36.0)
MCV: 86.5 fL (ref 78.0–100.0)
PLATELETS: 263 10*3/uL (ref 150–400)
RBC: 3.78 MIL/uL — ABNORMAL LOW (ref 4.22–5.81)
RDW: 12.5 % (ref 11.5–15.5)
WBC: 17.2 10*3/uL — AB (ref 4.0–10.5)

## 2015-09-04 LAB — BASIC METABOLIC PANEL
Anion gap: 6 (ref 5–15)
CHLORIDE: 104 mmol/L (ref 101–111)
CO2: 29 mmol/L (ref 22–32)
CREATININE: 0.88 mg/dL (ref 0.61–1.24)
Calcium: 8.5 mg/dL — ABNORMAL LOW (ref 8.9–10.3)
GFR calc Af Amer: >60 mL/min (ref >60)
GFR calc non Af Amer: >60 mL/min (ref >60)
GLUCOSE: 146 mg/dL — AB (ref 65–99)
POTASSIUM: 3.3 mmol/L — AB (ref 3.5–5.1)
SODIUM: 139 mmol/L (ref 135–145)

## 2015-09-04 LAB — CREATININE, SERUM
CREATININE: 0.88 mg/dL (ref 0.61–1.24)
GFR calc Af Amer: >60 mL/min (ref >60)
GFR calc non Af Amer: >60 mL/min (ref >60)

## 2015-09-04 LAB — GLUCOSE, CAPILLARY
Glucose-Capillary: 148 mg/dL — ABNORMAL HIGH (ref 65–99)
Glucose-Capillary: 202 mg/dL — ABNORMAL HIGH (ref 65–99)
Glucose-Capillary: 267 mg/dL — ABNORMAL HIGH (ref 65–99)
Glucose-Capillary: 130 mg/dL — ABNORMAL HIGH (ref 65–99)

## 2015-09-04 LAB — VANCOMYCIN, TROUGH: VANCOMYCIN TR: 19 ug/mL (ref 10.0–20.0)

## 2015-09-04 MED ORDER — CALCIUM CARBONATE ANTACID 500 MG PO CHEW
400.0000 mg | CHEWABLE_TABLET | Freq: Once | ORAL | Status: AC
  Administered 2015-09-04: 400 mg via ORAL
  Filled 2015-09-04: qty 2

## 2015-09-04 MED ORDER — VANCOMYCIN HCL 10 G IV SOLR
1250.0000 mg | Freq: Once | INTRAVENOUS | Status: AC
  Filled 2015-09-04: qty 1250

## 2015-09-04 MED ORDER — VANCOMYCIN HCL IN DEXTROSE 1-5 GM/200ML-% IV SOLN
1000.0000 mg | Freq: Three times a day (TID) | INTRAVENOUS | Status: DC
  Administered 2015-09-04 – 2015-09-07 (×9): 1000 mg via INTRAVENOUS
  Filled 2015-09-04 (×10): qty 200

## 2015-09-04 MED ORDER — VANCOMYCIN HCL IN DEXTROSE 1-5 GM/200ML-% IV SOLN
1000.0000 mg | Freq: Three times a day (TID) | INTRAVENOUS | Status: DC
  Filled 2015-09-04: qty 200

## 2015-09-04 NOTE — Progress Notes (Signed)
PROGRESS NOTE    [name] [name]  [id number] DOB: 1985/12/30 DOA: 08/30/2015  PCP: No primary care provider on file.   Brief Narrative:  30 year old with hypertension, diabetes who is not on medication and asthma presented on 6/13 with an abscess on his left shoulder. Was admitted by general surgery with drainage of the abscess. Sugars were severely uncontrolled and internal medicine was consulted. He was eventually transferred to internal medicine. Subsequently right hand was noted to become swollen and therefore hand surgery was consulted. He was found to have an abscess and taken to the OR for an IND of the hand. Culture from shoulder growing MRSA and staph lugdenensis.  Subjective: No complaints.  Assessment & Plan:   Principal Problem:   Abscess of left shoulder and right hand -Culture from left shoulder Growing staph including MRSA as mentioned above-continue vancomycin and Zosyn -Culture from right hand growing moderate staph rare gram-negative rods  -Blood cultures negative  -Continue wound care  Active Problems:   Uncontrolled diabetes mellitus  -Unable to afford medications and unable to afford a PCP - Hemoglobin A1c 13.7 -Continue to modify insulin dosages until good control is achieved   DVT prophylaxis: SCDs Code Status: Full code Family Communication:  Disposition Plan: home  Consultants:   gen surgery and hand surgery Procedures:   I and D  Antimicrobials:  Anti-infectives    Start     Dose/Rate Route Frequency Ordered Stop   09/04/15 1600  vancomycin (VANCOCIN) IVPB 1000 mg/200 mL premix     1,000 mg 200 mL/hr over 60 Minutes Intravenous Every 8 hours 09/04/15 0913     09/04/15 1000  vancomycin (VANCOCIN) IVPB 1000 mg/200 mL premix  Status:  Discontinued     1,000 mg 200 mL/hr over 60 Minutes Intravenous Every 8 hours 09/04/15 0910 09/04/15 0913   09/04/15 0915  vancomycin (VANCOCIN) 1,250 mg in sodium chloride 0.9 % 250 mL IVPB     1,250 mg 166.7  mL/hr over 90 Minutes Intravenous  Once 09/04/15 0913 09/04/15 1045   09/03/15 1022  polymyxin B 500,000 Units, bacitracin 50,000 Units in sodium chloride irrigation 0.9 % 500 mL irrigation  Status:  Discontinued       As needed 09/03/15 1022 09/03/15 1058   09/03/15 0800  piperacillin-tazobactam (ZOSYN) IVPB 3.375 g     3.375 g 12.5 mL/hr over 240 Minutes Intravenous Every 8 hours 09/02/15 2345     09/01/15 1600  vancomycin (VANCOCIN) 1,250 mg in sodium chloride 0.9 % 250 mL IVPB  Status:  Discontinued     1,250 mg 166.7 mL/hr over 90 Minutes Intravenous Every 8 hours 09/01/15 1551 09/04/15 0910   08/31/15 0800  piperacillin-tazobactam (ZOSYN) IVPB 3.375 g  Status:  Discontinued     3.375 g 12.5 mL/hr over 240 Minutes Intravenous Every 8 hours 08/31/15 0532 09/02/15 2345   08/31/15 0800  vancomycin (VANCOCIN) IVPB 1000 mg/200 mL premix  Status:  Discontinued     1,000 mg 200 mL/hr over 60 Minutes Intravenous Every 8 hours 08/31/15 0532 09/01/15 1551   08/31/15 0115  vancomycin (VANCOCIN) IVPB 1000 mg/200 mL premix     1,000 mg 200 mL/hr over 60 Minutes Intravenous  Once 08/31/15 0110 08/31/15 0407   08/31/15 0115  piperacillin-tazobactam (ZOSYN) IVPB 3.375 g     3.375 g 100 mL/hr over 30 Minutes Intravenous  Once 08/31/15 0110 08/31/15 0256       Objective: Filed Vitals:   09/03/15 1818 09/03/15 2135 09/04/15 0150 09/04/15 [id number]  BP: 145/73 156/82 148/84 159/84  Pulse: 97 102 101 95  Temp: 98.9 F (37.2 C) 101 F (38.3 C) 99.5 F (37.5 C) 99.6 F (37.6 C)  TempSrc: Oral Oral Oral Oral  Resp: 18 18 17 16   Height:      Weight:      SpO2: 96% 98% 96% 97%    Intake/Output Summary (Last 24 hours) at 09/04/15 1254 Last data filed at 09/04/15 0600  Gross per 24 hour  Intake   3920 ml  Output      5 ml  Net   3915 ml   Filed Weights   08/31/15 0129  Weight: 102.059 kg (225 lb)    Examination: General exam: Appears comfortable  HEENT: PERRLA, oral mucosa moist, no  sclera icterus or thrush Respiratory system: Clear to auscultation. Respiratory effort normal. Cardiovascular system: S1 & S2 heard, RRR.  No murmurs  Gastrointestinal system: Abdomen soft, non-tender, nondistended. Normal bowel sound. No organomegaly Central nervous system: Alert and oriented. No focal neurological deficits. Extremities: No cyanosis, clubbing or edema Skin: ulcer on post aspect L shoulder draining serosanguinous fluid- right hand bandaged.  Psychiatry:  Mood & affect appropriate.     Data Reviewed: I have personally reviewed following labs and imaging studies  CBC:  Recent Labs Lab 08/30/15 2307 08/31/15 1020 09/02/15 0951 09/03/15 0925 09/04/15 0700  WBC 16.9* 15.8* 16.1* 15.8* 17.2*  NEUTROABS 13.4*  --  12.7*  --   --   HGB 14.8 13.0 13.0 12.9* 11.1*  HCT 40.5 36.6* 35.8* 36.1* 32.7*  MCV 82.0 83.4 82.3 83.0 86.5  PLT 273 257 266 289 [id number]   Basic Metabolic Panel:  Recent Labs Lab 08/30/15 2307 08/31/15 1020 09/01/15 0446 09/02/15 0951 09/03/15 0925 09/04/15 0700 09/04/15 0755  NA 131*  --  134* 134* 139 139  --   K 3.9  --  3.2* 3.8 3.6 3.3*  --   CL 93*  --  100* 98* 105 104  --   CO2 29  --  27 26 26 29   --   GLUCOSE 420*  --  310* 275* 206* 146*  --   BUN 9  --  8 8 6  <5*  --   CREATININE 0.81 0.65 0.58* 0.81 0.93 0.88 0.88  CALCIUM 9.3  --  8.5* 8.9 9.0 8.5*  --   MG  --  1.9  --  1.9  --   --   --   PHOS  --  3.6  --  3.8  --   --   --    GFR: Estimated Creatinine Clearance: 141 mL/min (by C-G formula based on Cr of 0.88). Liver Function Tests:  Recent Labs Lab 08/30/15 2307 09/01/15 0446 09/02/15 0951  AST 12* 16  --   ALT 18 15*  --   ALKPHOS 95 87  --   BILITOT 0.7 0.9  --   PROT 7.7 6.0*  --   ALBUMIN 3.8 2.9* 3.1*   No results for input(s): LIPASE, AMYLASE in the last 168 hours. No results for input(s): AMMONIA in the last 168 hours. Coagulation Profile: No results for input(s): INR, PROTIME in the last 168  hours. Cardiac Enzymes: No results for input(s): CKTOTAL, CKMB, CKMBINDEX, TROPONINI in the last 168 hours. BNP (last 3 results) No results for input(s): PROBNP in the last 8760 hours. HbA1C: No results for input(s): HGBA1C in the last 72 hours. CBG:  Recent Labs Lab 09/03/15 1218 09/03/15 1558 09/03/15 2133 09/04/15  [id number] 09/04/15 1207  GLUCAP 167* 236* 293* 148* 202*   Lipid Profile: No results for input(s): CHOL, HDL, LDLCALC, TRIG, CHOLHDL, LDLDIRECT in the last 72 hours. Thyroid Function Tests: No results for input(s): TSH, T4TOTAL, FREET4, T3FREE, THYROIDAB in the last 72 hours. Anemia Panel: No results for input(s): VITAMINB12, FOLATE, FERRITIN, TIBC, IRON, RETICCTPCT in the last 72 hours. Urine analysis: No results found for: COLORURINE, APPEARANCEUR, LABSPEC, PHURINE, GLUCOSEU, HGBUR, BILIRUBINUR, KETONESUR, PROTEINUR, UROBILINOGEN, NITRITE, LEUKOCYTESUR Sepsis Labs: @LABRCNTIP (procalcitonin:4,lacticidven:4) ) Recent Results (from the past 240 hour(s))  Culture, blood (Routine X 2) w Reflex to ID Panel     Status: None (Preliminary result)   Collection Time: 08/31/15  1:03 AM  Result Value Ref Range Status   Specimen Description BLOOD RIGHT ANTECUBITAL  Final   Special Requests IN PEDIATRIC BOTTLE 4ML  Final   Culture   Final    NO GROWTH 3 DAYS Performed at [facility]    Report Status PENDING  Incomplete  Culture, blood (Routine X 2) w Reflex to ID Panel     Status: None (Preliminary result)   Collection Time: 08/31/15  1:11 AM  Result Value Ref Range Status   Specimen Description BLOOD LEFT ANTECUBITAL  Final   Special Requests BOTTLES DRAWN AEROBIC AND ANAEROBIC 5ML  Final   Culture   Final    NO GROWTH 3 DAYS Performed at [facility]    Report Status PENDING  Incomplete  Surgical pcr screen     Status: None   Collection Time: 08/31/15 11:00 AM  Result Value Ref Range Status   MRSA, PCR NEGATIVE NEGATIVE Final   Staphylococcus aureus  NEGATIVE NEGATIVE Final    Comment:        The Xpert SA Assay (FDA approved for NASAL specimens in patients over 108 years of age), is one component of a comprehensive surveillance program.  Test performance has been validated by [facility] for patients greater than or equal to 63 year old. It is not intended to diagnose infection nor to guide or monitor treatment.   Aerobic/Anaerobic Culture (surgical/deep wound)     Status: None (Preliminary result)   Collection Time: 08/31/15 12:40 PM  Result Value Ref Range Status   Specimen Description ABSCESS LEFT SHOULDER IRRIGATION & DEBRIDEMENT  Final   Special Requests NONE  Final   Gram Stain   Final    ABUNDANT WBC PRESENT, PREDOMINANTLY PMN MODERATE GRAM POSITIVE COCCI IN CLUSTERS IN PAIRS Performed at [facility]    Culture   Final    MODERATE METHICILLIN RESISTANT STAPHYLOCOCCUS AUREUS MODERATE STAPHYLOCOCCUS LUGDUNENSIS    Report Status PENDING  Incomplete   Organism ID, Bacteria METHICILLIN RESISTANT STAPHYLOCOCCUS AUREUS  Final   Organism ID, Bacteria STAPHYLOCOCCUS LUGDUNENSIS  Final      Susceptibility   Staphylococcus lugdunensis - MIC*    CIPROFLOXACIN <=0.5 SENSITIVE Sensitive     ERYTHROMYCIN <=0.25 SENSITIVE Sensitive     GENTAMICIN <=0.5 SENSITIVE Sensitive     OXACILLIN 2 SENSITIVE Sensitive     TETRACYCLINE <=1 SENSITIVE Sensitive     VANCOMYCIN <=0.5 SENSITIVE Sensitive     TRIMETH/SULFA <=10 SENSITIVE Sensitive     CLINDAMYCIN <=0.25 SENSITIVE Sensitive     RIFAMPIN <=0.5 SENSITIVE Sensitive     Inducible Clindamycin NEGATIVE Sensitive     * MODERATE STAPHYLOCOCCUS LUGDUNENSIS   Methicillin resistant staphylococcus aureus - MIC*    CIPROFLOXACIN <=0.5 SENSITIVE Sensitive     ERYTHROMYCIN >=8 RESISTANT Resistant  GENTAMICIN <=0.5 SENSITIVE Sensitive     OXACILLIN >=4 RESISTANT Resistant     TETRACYCLINE <=1 SENSITIVE Sensitive     VANCOMYCIN <=0.5 SENSITIVE Sensitive     TRIMETH/SULFA <=10  SENSITIVE Sensitive     CLINDAMYCIN <=0.25 SENSITIVE Sensitive     RIFAMPIN <=0.5 SENSITIVE Sensitive     Inducible Clindamycin NEGATIVE Sensitive     * MODERATE METHICILLIN RESISTANT STAPHYLOCOCCUS AUREUS  Aerobic/Anaerobic Culture (surgical/deep wound)     Status: None (Preliminary result)   Collection Time: 09/03/15 10:40 AM  Result Value Ref Range Status   Specimen Description ABSCESS RIGHT ARM  Final   Special Requests NONE  Final   Gram Stain   Final    MODERATE WBC PRESENT, PREDOMINANTLY PMN RARE SQUAMOUS EPITHELIAL CELLS PRESENT FEW GRAM POSITIVE COCCI IN CLUSTERS RARE GRAM NEGATIVE RODS Performed at [facility]  Final   Report Status PENDING  Incomplete         Radiology Studies: Mr Forearm Right Wo/w Cm  09/03/2015  CLINICAL DATA:  Diabetic patient pain and swelling about the right forearm for several days. Symptoms are worst distally. No known injury. EXAM: MRI OF THE RIGHT FOREARM WITHOUT AND WITH CONTRAST TECHNIQUE: Multiplanar, multisequence MR imaging was performed both before and after administration of intravenous contrast. CONTRAST:  20 ml MULTIHANCE GADOBENATE DIMEGLUMINE 529 MG/ML IV SOLN COMPARISON:  Plain films of the right wrist 08/31/2015 and 08/30/2015. FINDINGS: Extensive subcutaneous edema and enhancement about the forearm consistent with cellulitis are seen. Edema and enhancement are also identified in the musculature about the medial aspect of the forearm, best seen in the flexor carpi ulnaris, flexor digitorum longus and flexor digitorum superficialis. A rim enhancing fluid collection measuring 2.0 cm transverse by up to 1.1 cm AP by 6.0 cm long is seen in the flexor carpi ulnaris centered approximately 3 cm proximal to the fovea of the ulna and is consistent with abscess. No other abscess is identified. No evidence of septic joint is seen. No bone marrow signal abnormality to suggest osteomyelitis is  identified. IMPRESSION: Intense appearing cellulitis about the right forearm with myositis of the flexor carpi ulnaris, flexor digitorum longus and flexor digitorum superficialis. Intramuscular abscess in the distal flexor carpi ulnaris consistent with pyomyositis is identified. Negative for osteomyelitis or septic joint. Electronically Signed   By: Inge Rise M.D.   On: 09/03/2015 08:54      Scheduled Meds: . insulin aspart  0-20 Units Subcutaneous TID WC  . insulin aspart  0-5 Units Subcutaneous QHS  . insulin aspart  3 Units Subcutaneous TID WC  . insulin glargine  38 Units Subcutaneous BID  . mupirocin ointment   Topical BID  . piperacillin-tazobactam (ZOSYN)  IV  3.375 g Intravenous Q8H  . vancomycin  1,000 mg Intravenous Q8H   Continuous Infusions: . sodium chloride 100 mL/hr at 09/04/15 1223     LOS: 4 days    Time spent in minutes: 57    Severn, MD [facility] Pager: [email or web address] Password [facility] 09/04/2015, 12:54 PM

## 2015-09-04 NOTE — Progress Notes (Signed)
Patient ID: [name] [name], male   DOB: 25-Jan-1986, 30 y.o.   MRN: [id number] [facility] Surgery Progress Note:   1 Day Post-Op  Subjective: Mental status is more alert.  Had drainage of right forearm yesterday.   Objective: Vital signs in last 24 hours: Temp:  [98 F (36.7 C)-101 F (38.3 C)] 99.6 F (37.6 C) (06/18 0520) Pulse Rate:  [79-102] 95 (06/18 0520) Resp:  [10-18] 16 (06/18 0520) BP: (126-159)/(73-98) 159/84 mmHg (06/18 0520) SpO2:  [96 %-100 %] 97 % (06/18 0520)  Intake/Output from previous day: 06/17 0701 - 06/18 0700 In: 5420 [P.O.:1820; I.V.:3300; IV Piggyback:300] Out: 30 [Drains:5; Blood:25] Intake/Output this shift:    Physical Exam: Work of breathing is normal.  Left shoulder is packed.  MRSA reported.    Lab Results:  Results for orders placed or performed during the hospital encounter of 08/30/15 (from the past 48 hour(s))  Renal function panel     Status: Abnormal   Collection Time: 09/02/15  9:51 AM  Result Value Ref Range   Sodium 134 (L) 135 - 145 mmol/L   Potassium 3.8 3.5 - 5.1 mmol/L   Chloride 98 (L) 101 - 111 mmol/L   CO2 26 22 - 32 mmol/L   Glucose, Bld 275 (H) 65 - 99 mg/dL   BUN 8 6 - 20 mg/dL   Creatinine, Ser 0.81 0.61 - 1.24 mg/dL   Calcium 8.9 8.9 - 10.3 mg/dL   Phosphorus 3.8 2.5 - 4.6 mg/dL   Albumin 3.1 (L) 3.5 - 5.0 g/dL   GFR calc non Af Amer >60 >60 mL/min   GFR calc Af Amer >60 >60 mL/min    Comment: (NOTE) The eGFR has been calculated using the CKD EPI equation. This calculation has not been validated in all clinical situations. eGFR's persistently <60 mL/min signify possible Chronic Kidney Disease.    Anion gap 10 5 - 15  Magnesium     Status: None   Collection Time: 09/02/15  9:51 AM  Result Value Ref Range   Magnesium 1.9 1.7 - 2.4 mg/dL  CBC with Differential/Platelet     Status: Abnormal   Collection Time: 09/02/15  9:51 AM  Result Value Ref Range   WBC 16.1 (H) 4.0 - 10.5 K/uL   RBC 4.35 4.22 - 5.81 MIL/uL   Hemoglobin 13.0 13.0 - 17.0 g/dL   HCT 35.8 (L) 39.0 - 52.0 %   MCV 82.3 78.0 - 100.0 fL   MCH 29.9 26.0 - 34.0 pg   MCHC 36.3 (H) 30.0 - 36.0 g/dL   RDW 12.2 11.5 - 15.5 %   Platelets 266 150 - 400 K/uL   Neutrophils Relative % 78 %   Neutro Abs 12.7 (H) 1.7 - 7.7 K/uL   Lymphocytes Relative 11 %   Lymphs Abs 1.7 0.7 - 4.0 K/uL   Monocytes Relative 9 %   Monocytes Absolute 1.4 (H) 0.1 - 1.0 K/uL   Eosinophils Relative 2 %   Eosinophils Absolute 0.3 0.0 - 0.7 K/uL   Basophils Relative 0 %   Basophils Absolute 0.0 0.0 - 0.1 K/uL  Glucose, capillary     Status: Abnormal   Collection Time: 09/02/15 10:28 AM  Result Value Ref Range   Glucose-Capillary 452 (H) 65 - 99 mg/dL  Glucose, capillary     Status: Abnormal   Collection Time: 09/02/15 12:21 PM  Result Value Ref Range   Glucose-Capillary 375 (H) 65 - 99 mg/dL  Glucose, capillary     Status: Abnormal  Collection Time: 09/02/15  4:31 PM  Result Value Ref Range   Glucose-Capillary 162 (H) 65 - 99 mg/dL  Glucose, capillary     Status: Abnormal   Collection Time: 09/02/15 10:08 PM  Result Value Ref Range   Glucose-Capillary 223 (H) 65 - 99 mg/dL  Glucose, capillary     Status: Abnormal   Collection Time: 09/03/15  8:05 AM  Result Value Ref Range   Glucose-Capillary 216 (H) 65 - 99 mg/dL  CBC     Status: Abnormal   Collection Time: 09/03/15  9:25 AM  Result Value Ref Range   WBC 15.8 (H) 4.0 - 10.5 K/uL   RBC 4.35 4.22 - 5.81 MIL/uL   Hemoglobin 12.9 (L) 13.0 - 17.0 g/dL   HCT 36.1 (L) 39.0 - 52.0 %   MCV 83.0 78.0 - 100.0 fL   MCH 29.7 26.0 - 34.0 pg   MCHC 35.7 30.0 - 36.0 g/dL   RDW 12.2 11.5 - 15.5 %   Platelets 289 150 - 400 K/uL  Basic metabolic panel     Status: Abnormal   Collection Time: 09/03/15  9:25 AM  Result Value Ref Range   Sodium 139 135 - 145 mmol/L   Potassium 3.6 3.5 - 5.1 mmol/L   Chloride 105 101 - 111 mmol/L   CO2 26 22 - 32 mmol/L   Glucose, Bld 206 (H) 65 - 99 mg/dL   BUN 6 6 - 20 mg/dL    Creatinine, Ser 0.93 0.61 - 1.24 mg/dL   Calcium 9.0 8.9 - 10.3 mg/dL   GFR calc non Af Amer >60 >60 mL/min   GFR calc Af Amer >60 >60 mL/min    Comment: (NOTE) The eGFR has been calculated using the CKD EPI equation. This calculation has not been validated in all clinical situations. eGFR's persistently <60 mL/min signify possible Chronic Kidney Disease.    Anion gap 8 5 - 15  Aerobic/Anaerobic Culture (surgical/deep wound)     Status: None (Preliminary result)   Collection Time: 09/03/15 10:40 AM  Result Value Ref Range   Specimen Description ABSCESS RIGHT ARM    Special Requests NONE    Gram Stain      MODERATE WBC PRESENT, PREDOMINANTLY PMN RARE SQUAMOUS EPITHELIAL CELLS PRESENT FEW GRAM POSITIVE COCCI IN CLUSTERS RARE GRAM NEGATIVE RODS Performed at [facility]    Culture PENDING    Report Status PENDING   Glucose, capillary     Status: Abnormal   Collection Time: 09/03/15 11:18 AM  Result Value Ref Range   Glucose-Capillary 211 (H) 65 - 99 mg/dL  Glucose, capillary     Status: Abnormal   Collection Time: 09/03/15 12:18 PM  Result Value Ref Range   Glucose-Capillary 167 (H) 65 - 99 mg/dL  Glucose, capillary     Status: Abnormal   Collection Time: 09/03/15  3:58 PM  Result Value Ref Range   Glucose-Capillary 236 (H) 65 - 99 mg/dL  Glucose, capillary     Status: Abnormal   Collection Time: 09/03/15  9:33 PM  Result Value Ref Range   Glucose-Capillary 293 (H) 65 - 99 mg/dL  Basic metabolic panel     Status: Abnormal   Collection Time: 09/04/15  7:00 AM  Result Value Ref Range   Sodium 139 135 - 145 mmol/L   Potassium 3.3 (L) 3.5 - 5.1 mmol/L   Chloride 104 101 - 111 mmol/L   CO2 29 22 - 32 mmol/L   Glucose, Bld 146 (H) 65 -  99 mg/dL   BUN <5 (L) 6 - 20 mg/dL   Creatinine, Ser 0.88 0.61 - 1.24 mg/dL   Calcium 8.5 (L) 8.9 - 10.3 mg/dL   GFR calc non Af Amer >60 >60 mL/min   GFR calc Af Amer >60 >60 mL/min    Comment: (NOTE) The eGFR has been calculated  using the CKD EPI equation. This calculation has not been validated in all clinical situations. eGFR's persistently <60 mL/min signify possible Chronic Kidney Disease.    Anion gap 6 5 - 15  CBC     Status: Abnormal   Collection Time: 09/04/15  7:00 AM  Result Value Ref Range   WBC 17.2 (H) 4.0 - 10.5 K/uL   RBC 3.78 (L) 4.22 - 5.81 MIL/uL   Hemoglobin 11.1 (L) 13.0 - 17.0 g/dL   HCT 32.7 (L) 39.0 - 52.0 %   MCV 86.5 78.0 - 100.0 fL   MCH 29.4 26.0 - 34.0 pg   MCHC 33.9 30.0 - 36.0 g/dL   RDW 12.5 11.5 - 15.5 %   Platelets 263 150 - 400 K/uL  Vancomycin, trough     Status: None   Collection Time: 09/04/15  7:55 AM  Result Value Ref Range   Vancomycin Tr 19 10.0 - 20.0 ug/mL  Creatinine, serum     Status: None   Collection Time: 09/04/15  7:55 AM  Result Value Ref Range   Creatinine, Ser 0.88 0.61 - 1.24 mg/dL   GFR calc non Af Amer >60 >60 mL/min   GFR calc Af Amer >60 >60 mL/min    Comment: (NOTE) The eGFR has been calculated using the CKD EPI equation. This calculation has not been validated in all clinical situations. eGFR's persistently <60 mL/min signify possible Chronic Kidney Disease.   Glucose, capillary     Status: Abnormal   Collection Time: 09/04/15  8:28 AM  Result Value Ref Range   Glucose-Capillary 148 (H) 65 - 99 mg/dL    Radiology/Results: Mr Forearm Right Wo/w Cm  09/03/2015  CLINICAL DATA:  Diabetic patient pain and swelling about the right forearm for several days. Symptoms are worst distally. No known injury. EXAM: MRI OF THE RIGHT FOREARM WITHOUT AND WITH CONTRAST TECHNIQUE: Multiplanar, multisequence MR imaging was performed both before and after administration of intravenous contrast. CONTRAST:  20 ml MULTIHANCE GADOBENATE DIMEGLUMINE 529 MG/ML IV SOLN COMPARISON:  Plain films of the right wrist 08/31/2015 and 08/30/2015. FINDINGS: Extensive subcutaneous edema and enhancement about the forearm consistent with cellulitis are seen. Edema and enhancement  are also identified in the musculature about the medial aspect of the forearm, best seen in the flexor carpi ulnaris, flexor digitorum longus and flexor digitorum superficialis. A rim enhancing fluid collection measuring 2.0 cm transverse by up to 1.1 cm AP by 6.0 cm long is seen in the flexor carpi ulnaris centered approximately 3 cm proximal to the fovea of the ulna and is consistent with abscess. No other abscess is identified. No evidence of septic joint is seen. No bone marrow signal abnormality to suggest osteomyelitis is identified. IMPRESSION: Intense appearing cellulitis about the right forearm with myositis of the flexor carpi ulnaris, flexor digitorum longus and flexor digitorum superficialis. Intramuscular abscess in the distal flexor carpi ulnaris consistent with pyomyositis is identified. Negative for osteomyelitis or septic joint. Electronically Signed   By: Inge Rise M.D.   On: 09/03/2015 08:54    Anti-infectives: Anti-infectives    Start     Dose/Rate Route Frequency Ordered Stop  09/03/15 1022  polymyxin B 500,000 Units, bacitracin 50,000 Units in sodium chloride irrigation 0.9 % 500 mL irrigation  Status:  Discontinued       As needed 09/03/15 1022 09/03/15 1058   09/03/15 0800  piperacillin-tazobactam (ZOSYN) IVPB 3.375 g     3.375 g 12.5 mL/hr over 240 Minutes Intravenous Every 8 hours 09/02/15 2345     09/01/15 1600  vancomycin (VANCOCIN) 1,250 mg in sodium chloride 0.9 % 250 mL IVPB     1,250 mg 166.7 mL/hr over 90 Minutes Intravenous Every 8 hours 09/01/15 1551     08/31/15 0800  piperacillin-tazobactam (ZOSYN) IVPB 3.375 g  Status:  Discontinued     3.375 g 12.5 mL/hr over 240 Minutes Intravenous Every 8 hours 08/31/15 0532 09/02/15 2345   08/31/15 0800  vancomycin (VANCOCIN) IVPB 1000 mg/200 mL premix  Status:  Discontinued     1,000 mg 200 mL/hr over 60 Minutes Intravenous Every 8 hours 08/31/15 0532 09/01/15 1551   08/31/15 0115  vancomycin (VANCOCIN) IVPB 1000  mg/200 mL premix     1,000 mg 200 mL/hr over 60 Minutes Intravenous  Once 08/31/15 0110 08/31/15 0407   08/31/15 0115  piperacillin-tazobactam (ZOSYN) IVPB 3.375 g     3.375 g 100 mL/hr over 30 Minutes Intravenous  Once 08/31/15 0110 08/31/15 0256      Assessment/Plan: Problem List: Patient Active Problem List   Diagnosis Date Noted  . MRSA (methicillin resistant staph aureus) culture positive 09/04/2015  . Abscess of left shoulder 08/31/2015  . Uncontrolled diabetes mellitus ([place]) 08/31/2015  . Abscess 08/31/2015    Continue vancomycin and dressing changes left shoulder.   1 Day Post-Op    LOS: 4 days   Matt B. Hassell Done, MD, [facility] [phone] beeper [phone]  09/04/2015 9:00 AM

## 2015-09-04 NOTE — Progress Notes (Signed)
Subjective: 1 Day Post-Op Procedure(s) (LRB): INCISION AND DRAINAGE ABSCESS (Right) Patient is awake, he tolerated breakfast without difficulties. He describes mild generalized tenderness to the upper extremity but does have improvement compared to yesterday he states. He denies any fever, chills he does state he has been intermittently nauseous. Antibiotic regime is currently vancomycin and Zosyn until final cultures are obtained.  Objective: Vital signs in last 24 hours: Temp:  [98 F (36.7 C)-101 F (38.3 C)] 99.6 F (37.6 C) (06/18 0520) Pulse Rate:  [79-102] 95 (06/18 0520) Resp:  [10-18] 16 (06/18 0520) BP: (126-159)/(73-98) 159/84 mmHg (06/18 0520) SpO2:  [96 %-100 %] 97 % (06/18 0520)  Intake/Output from previous day: 06/17 0701 - 06/18 0700 In: 5420 [P.O.:1820; I.V.:3300; IV Piggyback:300] Out: 30 [Drains:5; Blood:25] Intake/Output this shift:     Recent Labs  09/02/15 0951 09/03/15 0925 09/04/15 0700  HGB 13.0 12.9* 11.1*    Recent Labs  09/03/15 0925 09/04/15 0700  WBC 15.8* 17.2*  RBC 4.35 3.78*  HCT 36.1* 32.7*  PLT 289 263    Recent Labs  09/03/15 0925 09/04/15 0700 09/04/15 0755  NA 139 139  --   K 3.6 3.3*  --   CL 105 104  --   CO2 26 29  --   BUN 6 <5*  --   CREATININE 0.93 0.88 0.88  GLUCOSE 206* 146*  --   CALCIUM 9.0 8.5*  --    No results for input(s): LABPT, INR in the last 72 hours. Results for orders placed or performed during the hospital encounter of 08/30/15  Culture, blood (Routine X 2) w Reflex to ID Panel     Status: None (Preliminary result)   Collection Time: 08/31/15  1:03 AM  Result Value Ref Range Status   Specimen Description BLOOD RIGHT ANTECUBITAL  Final   Special Requests IN PEDIATRIC BOTTLE 4ML  Final   Culture   Final    NO GROWTH 3 DAYS Performed at [facility]    Report Status PENDING  Incomplete  Culture, blood (Routine X 2) w Reflex to ID Panel     Status: None (Preliminary result)   Collection  Time: 08/31/15  1:11 AM  Result Value Ref Range Status   Specimen Description BLOOD LEFT ANTECUBITAL  Final   Special Requests BOTTLES DRAWN AEROBIC AND ANAEROBIC 5ML  Final   Culture   Final    NO GROWTH 3 DAYS Performed at [facility]    Report Status PENDING  Incomplete  Surgical pcr screen     Status: None   Collection Time: 08/31/15 11:00 AM  Result Value Ref Range Status   MRSA, PCR NEGATIVE NEGATIVE Final   Staphylococcus aureus NEGATIVE NEGATIVE Final    Comment:        The Xpert SA Assay (FDA approved for NASAL specimens in patients over 74 years of age), is one component of a comprehensive surveillance program.  Test performance has been validated by [facility] for patients greater than or equal to 20 year old. It is not intended to diagnose infection nor to guide or monitor treatment.   Aerobic/Anaerobic Culture (surgical/deep wound)     Status: None (Preliminary result)   Collection Time: 08/31/15 12:40 PM  Result Value Ref Range Status   Specimen Description ABSCESS LEFT SHOULDER IRRIGATION & DEBRIDEMENT  Final   Special Requests NONE  Final   Gram Stain   Final    ABUNDANT WBC PRESENT, PREDOMINANTLY PMN MODERATE GRAM POSITIVE COCCI IN  CLUSTERS IN PAIRS Performed at [facility]    Culture   Final    MODERATE METHICILLIN RESISTANT STAPHYLOCOCCUS AUREUS MODERATE STAPHYLOCOCCUS LUGDUNENSIS    Report Status PENDING  Incomplete   Organism ID, Bacteria METHICILLIN RESISTANT STAPHYLOCOCCUS AUREUS  Final   Organism ID, Bacteria STAPHYLOCOCCUS LUGDUNENSIS  Final      Susceptibility   Staphylococcus lugdunensis - MIC*    CIPROFLOXACIN <=0.5 SENSITIVE Sensitive     ERYTHROMYCIN <=0.25 SENSITIVE Sensitive     GENTAMICIN <=0.5 SENSITIVE Sensitive     OXACILLIN 2 SENSITIVE Sensitive     TETRACYCLINE <=1 SENSITIVE Sensitive     VANCOMYCIN <=0.5 SENSITIVE Sensitive     TRIMETH/SULFA <=10 SENSITIVE Sensitive     CLINDAMYCIN <=0.25 SENSITIVE Sensitive      RIFAMPIN <=0.5 SENSITIVE Sensitive     Inducible Clindamycin NEGATIVE Sensitive     * MODERATE STAPHYLOCOCCUS LUGDUNENSIS   Methicillin resistant staphylococcus aureus - MIC*    CIPROFLOXACIN <=0.5 SENSITIVE Sensitive     ERYTHROMYCIN >=8 RESISTANT Resistant     GENTAMICIN <=0.5 SENSITIVE Sensitive     OXACILLIN >=4 RESISTANT Resistant     TETRACYCLINE <=1 SENSITIVE Sensitive     VANCOMYCIN <=0.5 SENSITIVE Sensitive     TRIMETH/SULFA <=10 SENSITIVE Sensitive     CLINDAMYCIN <=0.25 SENSITIVE Sensitive     RIFAMPIN <=0.5 SENSITIVE Sensitive     Inducible Clindamycin NEGATIVE Sensitive     * MODERATE METHICILLIN RESISTANT STAPHYLOCOCCUS AUREUS  Aerobic/Anaerobic Culture (surgical/deep wound)     Status: None (Preliminary result)   Collection Time: 09/03/15 10:40 AM  Result Value Ref Range Status   Specimen Description ABSCESS RIGHT ARM  Final   Special Requests NONE  Final   Gram Stain   Final    MODERATE WBC PRESENT, PREDOMINANTLY PMN RARE SQUAMOUS EPITHELIAL CELLS PRESENT FEW GRAM POSITIVE COCCI IN CLUSTERS RARE GRAM NEGATIVE RODS Performed at [facility]    Culture PENDING  Incomplete   Report Status PENDING  Incomplete   Examination of the right upper extremity shows that he is elevating this in a [place] sling. Dressings and splint are intact. Digital range of motion is tender with active and passive range of motion. His sensation is intact. He has mild swelling about the digits. Has mild swelling of the proximal forearm with out significant erythema at this juncture. JP drain is intact and functioning and is not removed.  Assessment/Plan: 1 Day Post-Op Procedure(s) (LRB): INCISION AND DRAINAGE ABSCESS (Right) Ultrasound pending in regards to the right wrist status post I&D. We questioned whether this was hematogenous seeding of the infection secondary to  previous hematoma formation about the wrist after an upper extremity injury he describes in his initial admit. It  should be noted he does have some rare gram-negative rods noted in regards to the pending cultures of the wrist which were not evident in the shoulder cultures. We'll continue current IV antibiotic regime continue close observation, elevation to the upper extremity encourage frequent range of motion. We'll plan to pull the drain tomorrow. We have discussed with the patient the issues regarding their infection to the extremity. We will continue antibiotics and await culture results. Often times it will take 3-5 days for cultures to become final. During this time we will typically have the patient on intravenous antibiotics until we can find a parenteral route of antibiotic regime specific for the bacteria or organism isolated. We have discussed with the patient the need for daily irrigation and debridement as well  as therapy to the area. We have discussed with the patient the necessity of range of motion to the involved joints as discussed today. We have discussed with the patient the unpredictability of infections at times. We'll continue to work towards good pain control and restoration of function. The patient understands the need for meticulous wound care and the necessity of proper followup.  The possible complications of stiffness (loss of motion), resistant infection, possible deep bone infection, possible chronic pain issues, possible need for multiple surgeries and even amputation.  With this in mind the patient understands our goal is to eradicate the infection to quiesence. We will continue to work towards these goals.  [name] [name] 09/04/2015, 10:19 AM

## 2015-09-04 NOTE — Plan of Care (Signed)
Problem: Skin Integrity: Goal: Risk for impaired skin integrity will decrease Outcome: Progressing Assisted pt with bath. Encouraged and stressed good hygiene and skin care in light of current hospital problems and hx of diabetes verbalized understanding. Foot care also encouraged.

## 2015-09-04 NOTE — Progress Notes (Signed)
Pharmacy Antibiotic Note  [name] [name] is a 30 y.o. male admitted on 08/30/2015 with enlarging abscess of left shoulder.  S/p I&D of L shoulder on 6/14 with cultures growing MRSA and methicillinc-susc S. lugdunensis (a CoNS).  Underwent I&D of R arm 6/17 - cx pending.    Pharmacy has been consulted for Vancomycin and Zosyn dosing.  Today, 09/04/2015  Day #5 antibiotics  Vancomycin trough - 19 mcg/ml on 1250mg  IV q8h - goal trough is 10-98mcg/ml for skin/soft tissue infection  Renal: SCr stable  WBC remains elevated, increase overnight  Tm = 101  6/14 wound culture from L shoulder noted with MRSA and Methcillin-susc Coag neg staph  6/17 wound culture: pending but gram stain with GPC clusters and rare GNR  Blood cultures remain NGTD  Plan:  Continue Zosyn 3.375g IV Q8H infused over 4hrs.  Vancomycin trough above goal, reduce Vancomycin 1000mg  IV q8h for trough = 57mcg/mL  Follow up renal fxn and repeat vancomycin trough if remains on vancomycin  Await 6/17 forearm cultures and f/u ability to narrow antibiotics  Temp (24hrs), Avg:98.7 F (37.1 C), Min:98 F (36.7 C), Max:101 F (38.3 C)   Recent Labs Lab 08/30/15 2307 08/30/15 2316 08/31/15 0201 08/31/15 1020 09/01/15 0446 09/01/15 1503 09/02/15 0951 09/03/15 0925 09/04/15 0700 09/04/15 0755  WBC 16.9*  --   --  15.8*  --   --  16.1* 15.8* 17.2*  --   CREATININE 0.81  --   --  0.65 0.58*  --  0.81 0.93 0.88 0.88  LATICACIDVEN  --  1.37 1.83  --   --   --   --   --   --   --   VANCOTROUGH  --   --   --   --   --  10  --   --   --  19    Estimated Creatinine Clearance: 141 mL/min (by C-G formula based on Cr of 0.88).    No Known Allergies    Antimicrobials this admission: Vancomycin 08/31/2015 >> Zosyn 08/31/2015 >>   Dose adjustments this admission: 6/15 1500 Vanc trough = 10 on Vanc 1g IV q8h 6/18 0800 Vanc trough = 19 on 1250mg  IV q8h  Microbiology results: 6/14 BCx x2: ngtd 6/14 abscess shoulder: gram  stain showed GPC cocci in cluster, pairs - cx IP 6/14 MRSA PCR: negative 6/17 R forearm: pending (Gram stain - GPC clusters, rare GNR)  Thank you for allowing pharmacy to be a part of this patient's care.  Doreene Eland, PharmD, BCPS.   Pager: [phone] 09/04/2015 9:08 AM

## 2015-09-05 ENCOUNTER — Encounter (HOSPITAL_COMMUNITY): Payer: Self-pay | Admitting: Orthopedic Surgery

## 2015-09-05 LAB — GLUCOSE, CAPILLARY
Glucose-Capillary: 135 mg/dL — ABNORMAL HIGH (ref 65–99)
Glucose-Capillary: 125 mg/dL — ABNORMAL HIGH (ref 65–99)
Glucose-Capillary: 92 mg/dL (ref 65–99)
Glucose-Capillary: 117 mg/dL — ABNORMAL HIGH (ref 65–99)

## 2015-09-05 LAB — CBC
HEMATOCRIT: 34.9 % — AB (ref 39.0–52.0)
HEMOGLOBIN: 11.8 g/dL — AB (ref 13.0–17.0)
MCH: 28.9 pg (ref 26.0–34.0)
MCHC: 33.8 g/dL (ref 30.0–36.0)
MCV: 85.5 fL (ref 78.0–100.0)
Platelets: 296 10*3/uL (ref 150–400)
RBC: 4.08 MIL/uL — ABNORMAL LOW (ref 4.22–5.81)
RDW: 12.4 % (ref 11.5–15.5)
WBC: 16.1 10*3/uL — ABNORMAL HIGH (ref 4.0–10.5)

## 2015-09-05 LAB — CULTURE, BLOOD (ROUTINE X 2)
Culture: NO GROWTH
Culture: NO GROWTH

## 2015-09-05 LAB — AEROBIC/ANAEROBIC CULTURE W GRAM STAIN (SURGICAL/DEEP WOUND)

## 2015-09-05 LAB — BASIC METABOLIC PANEL
Anion gap: 7 (ref 5–15)
BUN: 6 mg/dL (ref 6–20)
CHLORIDE: 104 mmol/L (ref 101–111)
CO2: 28 mmol/L (ref 22–32)
CREATININE: 1.41 mg/dL — AB (ref 0.61–1.24)
Calcium: 8.7 mg/dL — ABNORMAL LOW (ref 8.9–10.3)
GFR calc Af Amer: >60 mL/min (ref >60)
GFR calc non Af Amer: >60 mL/min (ref >60)
Glucose, Bld: 104 mg/dL — ABNORMAL HIGH (ref 65–99)
Potassium: 3.2 mmol/L — ABNORMAL LOW (ref 3.5–5.1)
Sodium: 139 mmol/L (ref 135–145)

## 2015-09-05 LAB — AEROBIC/ANAEROBIC CULTURE (SURGICAL/DEEP WOUND)

## 2015-09-05 MED ORDER — POTASSIUM CHLORIDE CRYS ER 20 MEQ PO TBCR
40.0000 meq | EXTENDED_RELEASE_TABLET | ORAL | Status: AC
  Administered 2015-09-05 (×2): 40 meq via ORAL
  Filled 2015-09-05 (×2): qty 2

## 2015-09-05 MED ORDER — LIDOCAINE-EPINEPHRINE (PF) 1 %-1:200000 IJ SOLN
20.0000 mL | Freq: Once | INTRAMUSCULAR | Status: AC
  Administered 2015-09-05: 20 mL via INTRADERMAL
  Filled 2015-09-05: qty 20

## 2015-09-05 NOTE — Progress Notes (Signed)
PROGRESS NOTE    [name] [name]  [id number] DOB: 11/13/1985 DOA: 08/30/2015  PCP: No primary care provider on file.   Brief Narrative:  30 year old with hypertension, diabetes who is not on medication and asthma presented on 6/13 with an abscess on his left shoulder. Was admitted by general surgery with drainage of the abscess. Sugars were severely uncontrolled and internal medicine was consulted. He was eventually transferred to internal medicine. Subsequently right hand was noted to become swollen and therefore hand surgery was consulted. He was found to have an abscess and taken to the OR for an IND of the hand. Culture from shoulder growing MRSA and staph lugdenensis.  Subjective: No complaints.  Assessment & Plan:   Principal Problem:   Abscess of left shoulder and right hand -Culture from left shoulder Growing staph Lugdunensis and MRSA as mentioned above-continue vancomycin and Zosyn -Culture from right hand growing moderate staph rare gram-negative rods  - New right shoulder abscess drained today by gen surgery -Blood cultures negative  -Continue wound care  Active Problems:   Uncontrolled diabetes mellitus  -Unable to afford medications and unable to afford a PCP - Hemoglobin A1c 13.7 -Continue to modify insulin dosages until good control is achieved  AKI - due to Vancomycin??- follow- does not appear to be prerenal   - follow   DVT prophylaxis: SCDs Code Status: Full code Family Communication:  Disposition Plan: home  Consultants:   gen surgery and hand surgery Procedures:   I and D  Antimicrobials:  Anti-infectives    Start     Dose/Rate Route Frequency Ordered Stop   09/04/15 1600  vancomycin (VANCOCIN) IVPB 1000 mg/200 mL premix     1,000 mg 200 mL/hr over 60 Minutes Intravenous Every 8 hours 09/04/15 0913     09/04/15 1000  vancomycin (VANCOCIN) IVPB 1000 mg/200 mL premix  Status:  Discontinued     1,000 mg 200 mL/hr over 60 Minutes Intravenous  Every 8 hours 09/04/15 0910 09/04/15 0913   09/04/15 0915  vancomycin (VANCOCIN) 1,250 mg in sodium chloride 0.9 % 250 mL IVPB     1,250 mg 166.7 mL/hr over 90 Minutes Intravenous  Once 09/04/15 0913 09/04/15 1045   09/03/15 1022  polymyxin B 500,000 Units, bacitracin 50,000 Units in sodium chloride irrigation 0.9 % 500 mL irrigation  Status:  Discontinued       As needed 09/03/15 1022 09/03/15 1058   09/03/15 0800  piperacillin-tazobactam (ZOSYN) IVPB 3.375 g     3.375 g 12.5 mL/hr over 240 Minutes Intravenous Every 8 hours 09/02/15 2345     09/01/15 1600  vancomycin (VANCOCIN) 1,250 mg in sodium chloride 0.9 % 250 mL IVPB  Status:  Discontinued     1,250 mg 166.7 mL/hr over 90 Minutes Intravenous Every 8 hours 09/01/15 1551 09/04/15 0910   08/31/15 0800  piperacillin-tazobactam (ZOSYN) IVPB 3.375 g  Status:  Discontinued     3.375 g 12.5 mL/hr over 240 Minutes Intravenous Every 8 hours 08/31/15 0532 09/02/15 2345   08/31/15 0800  vancomycin (VANCOCIN) IVPB 1000 mg/200 mL premix  Status:  Discontinued     1,000 mg 200 mL/hr over 60 Minutes Intravenous Every 8 hours 08/31/15 0532 09/01/15 1551   08/31/15 0115  vancomycin (VANCOCIN) IVPB 1000 mg/200 mL premix     1,000 mg 200 mL/hr over 60 Minutes Intravenous  Once 08/31/15 0110 08/31/15 0407   08/31/15 0115  piperacillin-tazobactam (ZOSYN) IVPB 3.375 g     3.375 g 100 mL/hr over 30  Minutes Intravenous  Once 08/31/15 0110 08/31/15 0256       Objective: Filed Vitals:   09/04/15 1437 09/04/15 2116 09/05/15 0521 09/05/15 1326  BP: 152/72 158/90 134/88 155/85  Pulse: 91 88 83 82  Temp: 98.6 F (37 C) 98.7 F (37.1 C) 98.6 F (37 C) 98.7 F (37.1 C)  TempSrc: Oral Oral Oral Oral  Resp: 18 16 16 16   Height:      Weight:      SpO2: 98% 98% 98% 100%    Intake/Output Summary (Last 24 hours) at 09/05/15 1337 Last data filed at 09/05/15 0600  Gross per 24 hour  Intake   2050 ml  Output    7.5 ml  Net 2042.5 ml   Filed Weights     08/31/15 0129  Weight: 102.059 kg (225 lb)    Examination: General exam: Appears comfortable  HEENT: PERRLA, oral mucosa moist, no sclera icterus or thrush Respiratory system: Clear to auscultation. Respiratory effort normal. Cardiovascular system: S1 & S2 heard, RRR.  No murmurs  Gastrointestinal system: Abdomen soft, non-tender, nondistended. Normal bowel sound. No organomegaly Central nervous system: Alert and oriented. No focal neurological deficits. Extremities: No cyanosis, clubbing or edema Skin: ulcer on post aspect L shoulder draining serosanguinous fluid- right hand bandaged.  Psychiatry:  Mood & affect appropriate.     Data Reviewed: I have personally reviewed following labs and imaging studies  CBC:  Recent Labs Lab 08/30/15 2307 08/31/15 1020 09/02/15 0951 09/03/15 0925 09/04/15 0700 09/05/15 0429  WBC 16.9* 15.8* 16.1* 15.8* 17.2* 16.1*  NEUTROABS 13.4*  --  12.7*  --   --   --   HGB 14.8 13.0 13.0 12.9* 11.1* 11.8*  HCT 40.5 36.6* 35.8* 36.1* 32.7* 34.9*  MCV 82.0 83.4 82.3 83.0 86.5 85.5  PLT 273 257 266 289 263 0000000   Basic Metabolic Panel:  Recent Labs Lab 08/31/15 1020 09/01/15 0446 09/02/15 0951 09/03/15 0925 09/04/15 0700 09/04/15 0755 09/05/15 0429  NA  --  134* 134* 139 139  --  139  K  --  3.2* 3.8 3.6 3.3*  --  3.2*  CL  --  100* 98* 105 104  --  104  CO2  --  27 26 26 29   --  28  GLUCOSE  --  310* 275* 206* 146*  --  104*  BUN  --  8 8 6  <5*  --  6  CREATININE 0.65 0.58* 0.81 0.93 0.88 0.88 1.41*  CALCIUM  --  8.5* 8.9 9.0 8.5*  --  8.7*  MG 1.9  --  1.9  --   --   --   --   PHOS 3.6  --  3.8  --   --   --   --    GFR: Estimated Creatinine Clearance: 88 mL/min (by C-G formula based on Cr of 1.41). Liver Function Tests:  Recent Labs Lab 08/30/15 2307 09/01/15 0446 09/02/15 0951  AST 12* 16  --   ALT 18 15*  --   ALKPHOS 95 87  --   BILITOT 0.7 0.9  --   PROT 7.7 6.0*  --   ALBUMIN 3.8 2.9* 3.1*   No results for  input(s): LIPASE, AMYLASE in the last 168 hours. No results for input(s): AMMONIA in the last 168 hours. Coagulation Profile: No results for input(s): INR, PROTIME in the last 168 hours. Cardiac Enzymes: No results for input(s): CKTOTAL, CKMB, CKMBINDEX, TROPONINI in the last 168 hours. BNP (last  3 results) No results for input(s): PROBNP in the last 8760 hours. HbA1C: No results for input(s): HGBA1C in the last 72 hours. CBG:  Recent Labs Lab 09/04/15 1207 09/04/15 1655 09/04/15 2114 09/05/15 0755 09/05/15 1228  GLUCAP 202* 267* 130* 135* 125*   Lipid Profile: No results for input(s): CHOL, HDL, LDLCALC, TRIG, CHOLHDL, LDLDIRECT in the last 72 hours. Thyroid Function Tests: No results for input(s): TSH, T4TOTAL, FREET4, T3FREE, THYROIDAB in the last 72 hours. Anemia Panel: No results for input(s): VITAMINB12, FOLATE, FERRITIN, TIBC, IRON, RETICCTPCT in the last 72 hours. Urine analysis: No results found for: COLORURINE, APPEARANCEUR, LABSPEC, PHURINE, GLUCOSEU, HGBUR, BILIRUBINUR, KETONESUR, PROTEINUR, UROBILINOGEN, NITRITE, LEUKOCYTESUR Sepsis Labs: @LABRCNTIP (procalcitonin:4,lacticidven:4) ) Recent Results (from the past 240 hour(s))  Culture, blood (Routine X 2) w Reflex to ID Panel     Status: None (Preliminary result)   Collection Time: 08/31/15  1:03 AM  Result Value Ref Range Status   Specimen Description BLOOD RIGHT ANTECUBITAL  Final   Special Requests IN PEDIATRIC BOTTLE 4ML  Final   Culture   Final    NO GROWTH 4 DAYS Performed at [facility]    Report Status PENDING  Incomplete  Culture, blood (Routine X 2) w Reflex to ID Panel     Status: None (Preliminary result)   Collection Time: 08/31/15  1:11 AM  Result Value Ref Range Status   Specimen Description BLOOD LEFT ANTECUBITAL  Final   Special Requests BOTTLES DRAWN AEROBIC AND ANAEROBIC 5ML  Final   Culture   Final    NO GROWTH 4 DAYS Performed at [facility]    Report Status PENDING   Incomplete  Surgical pcr screen     Status: None   Collection Time: 08/31/15 11:00 AM  Result Value Ref Range Status   MRSA, PCR NEGATIVE NEGATIVE Final   Staphylococcus aureus NEGATIVE NEGATIVE Final    Comment:        The Xpert SA Assay (FDA approved for NASAL specimens in patients over 61 years of age), is one component of a comprehensive surveillance program.  Test performance has been validated by [facility] for patients greater than or equal to 48 year old. It is not intended to diagnose infection nor to guide or monitor treatment.   Aerobic/Anaerobic Culture (surgical/deep wound)     Status: None   Collection Time: 08/31/15 12:40 PM  Result Value Ref Range Status   Specimen Description ABSCESS LEFT SHOULDER IRRIGATION & DEBRIDEMENT  Final   Special Requests NONE  Final   Gram Stain   Final    ABUNDANT WBC PRESENT, PREDOMINANTLY PMN MODERATE GRAM POSITIVE COCCI IN CLUSTERS IN PAIRS    Culture   Final    MODERATE METHICILLIN RESISTANT STAPHYLOCOCCUS AUREUS MODERATE STAPHYLOCOCCUS LUGDUNENSIS NO ANAEROBES ISOLATED Performed at [facility]    Report Status 09/05/2015 FINAL  Final   Organism ID, Bacteria METHICILLIN RESISTANT STAPHYLOCOCCUS AUREUS  Final   Organism ID, Bacteria STAPHYLOCOCCUS LUGDUNENSIS  Final      Susceptibility   Staphylococcus lugdunensis - MIC*    CIPROFLOXACIN <=0.5 SENSITIVE Sensitive     ERYTHROMYCIN <=0.25 SENSITIVE Sensitive     GENTAMICIN <=0.5 SENSITIVE Sensitive     OXACILLIN 2 SENSITIVE Sensitive     TETRACYCLINE <=1 SENSITIVE Sensitive     VANCOMYCIN <=0.5 SENSITIVE Sensitive     TRIMETH/SULFA <=10 SENSITIVE Sensitive     CLINDAMYCIN <=0.25 SENSITIVE Sensitive     RIFAMPIN <=0.5 SENSITIVE Sensitive     Inducible  Clindamycin NEGATIVE Sensitive     * MODERATE STAPHYLOCOCCUS LUGDUNENSIS   Methicillin resistant staphylococcus aureus - MIC*    CIPROFLOXACIN <=0.5 SENSITIVE Sensitive     ERYTHROMYCIN >=8 RESISTANT Resistant      GENTAMICIN <=0.5 SENSITIVE Sensitive     OXACILLIN >=4 RESISTANT Resistant     TETRACYCLINE <=1 SENSITIVE Sensitive     VANCOMYCIN <=0.5 SENSITIVE Sensitive     TRIMETH/SULFA <=10 SENSITIVE Sensitive     CLINDAMYCIN <=0.25 SENSITIVE Sensitive     RIFAMPIN <=0.5 SENSITIVE Sensitive     Inducible Clindamycin NEGATIVE Sensitive     * MODERATE METHICILLIN RESISTANT STAPHYLOCOCCUS AUREUS  Aerobic/Anaerobic Culture (surgical/deep wound)     Status: None (Preliminary result)   Collection Time: 09/03/15 10:40 AM  Result Value Ref Range Status   Specimen Description ABSCESS RIGHT ARM  Final   Special Requests NONE  Final   Gram Stain   Final    MODERATE WBC PRESENT, PREDOMINANTLY PMN RARE SQUAMOUS EPITHELIAL CELLS PRESENT FEW GRAM POSITIVE COCCI IN CLUSTERS RARE GRAM NEGATIVE RODS Performed at [facility]    Culture   Final    MODERATE METHICILLIN RESISTANT STAPHYLOCOCCUS AUREUS NO ANAEROBES ISOLATED; CULTURE IN PROGRESS FOR 5 DAYS    Report Status PENDING  Incomplete   Organism ID, Bacteria METHICILLIN RESISTANT STAPHYLOCOCCUS AUREUS  Final      Susceptibility   Methicillin resistant staphylococcus aureus - MIC*    CIPROFLOXACIN <=0.5 SENSITIVE Sensitive     ERYTHROMYCIN >=8 RESISTANT Resistant     GENTAMICIN <=0.5 SENSITIVE Sensitive     OXACILLIN >=4 RESISTANT Resistant     TETRACYCLINE <=1 SENSITIVE Sensitive     VANCOMYCIN 1 SENSITIVE Sensitive     TRIMETH/SULFA <=10 SENSITIVE Sensitive     CLINDAMYCIN <=0.25 SENSITIVE Sensitive     RIFAMPIN <=0.5 SENSITIVE Sensitive     Inducible Clindamycin NEGATIVE Sensitive     * MODERATE METHICILLIN RESISTANT STAPHYLOCOCCUS AUREUS         Radiology Studies: No results found.    Scheduled Meds: . insulin aspart  0-20 Units Subcutaneous TID WC  . insulin aspart  0-5 Units Subcutaneous QHS  . insulin aspart  3 Units Subcutaneous TID WC  . insulin glargine  38 Units Subcutaneous BID  . mupirocin ointment   Topical BID  .  piperacillin-tazobactam (ZOSYN)  IV  3.375 g Intravenous Q8H  . potassium chloride  40 mEq Oral Q4H  . vancomycin  1,000 mg Intravenous Q8H   Continuous Infusions: . sodium chloride 100 mL/hr (09/04/15 2302)     LOS: 5 days    Time spent in minutes: 3    [place], MD [facility] Pager: [email or web address] Password [email or web address] 09/05/2015, 1:37 PM

## 2015-09-05 NOTE — Progress Notes (Signed)
Gram Stain WBC PRESENT,BOTH PMN AND MONONUCLEAR ABUNDANT  GRAM POSITIVE COCCI    From. [facility]

## 2015-09-05 NOTE — Progress Notes (Signed)
2 Days Post-Op  Subjective: Lying on sofa, complaining of neck pain, nausea and vomiting yesterday. He has a history of neck issues in the past.   Unable to keep much down, glucose 135 this AM.  Open wound left shoulder looks fine.  Nurse is doing the dressing change.   Objective: Vital signs in last 24 hours: Temp:  [98.6 F (37 C)-98.7 F (37.1 C)] 98.6 F (37 C) (06/19 0521) Pulse Rate:  [83-91] 83 (06/19 0521) Resp:  [16-18] 16 (06/19 0521) BP: (134-158)/(72-90) 134/88 mmHg (06/19 0521) SpO2:  [98 %] 98 % (06/19 0521)  2400 IV fluids PO not recorded Drain:  68ml Afebrile, VSS Glucose is better, WBC is 16.1 MRI 09/03/15:  Intense appearing cellulitis about the right forearm with myositis of the flexor carpi ulnaris, flexor digitorum longus and flexor digitorum superficialis. Intramuscular abscess in the distal flexor carpi ulnaris consistent with pyomyositis is identified. Negative for osteomyelitis or septic joint.   Intake/Output from previous day: 06/18 0701 - 06/19 0700 In: 2450 [I.V.:2400; IV Piggyback:50] Out: 10 [Drains:10] Intake/Output this shift:    General appearance: alert, cooperative and no distress Incision/Wound:  Open site looks fine, clean and well packed.   Right arm with dressing up to his elbow. Neck- no abscess or swelling noted any place.  Lab Results:   Recent Labs  09/04/15 0700 09/05/15 0429  WBC 17.2* 16.1*  HGB 11.1* 11.8*  HCT 32.7* 34.9*  PLT 263 296    BMET  Recent Labs  09/03/15 0925 09/04/15 0700 09/04/15 0755  NA 139 139  --   K 3.6 3.3*  --   CL 105 104  --   CO2 26 29  --   GLUCOSE 206* 146*  --   BUN 6 <5*  --   CREATININE 0.93 0.88 0.88  CALCIUM 9.0 8.5*  --    PT/INR No results for input(s): LABPROT, INR in the last 72 hours.   Recent Labs Lab 08/30/15 2307 09/01/15 0446 09/02/15 0951  AST 12* 16  --   ALT 18 15*  --   ALKPHOS 95 87  --   BILITOT 0.7 0.9  --   PROT 7.7 6.0*  --   ALBUMIN 3.8 2.9*  3.1*     Lipase  No results found for: LIPASE   Studies/Results: No results found.  Medications: . insulin aspart  0-20 Units Subcutaneous TID WC  . insulin aspart  0-5 Units Subcutaneous QHS  . insulin aspart  3 Units Subcutaneous TID WC  . insulin glargine  38 Units Subcutaneous BID  . mupirocin ointment   Topical BID  . piperacillin-tazobactam (ZOSYN)  IV  3.375 g Intravenous Q8H  . vancomycin  1,000 mg Intravenous Q8H   . sodium chloride 100 mL/hr (09/04/15 2302)   Assessment/Plan Abcess left shoulder (6 x 12 cm)  IRRIGATION AND DEBRIDEMENT ABSCESS LEFT SHOULDER, 08/31/15, Dr. Lucia Gaskins MRSA grown from the shoulder culture Uncontrolled diabetes Glucose - 275 Infected hematoma of FCU muscle belly, right forearm. S/p 1. Irrigation and debridement of deep abscess, right forearm. 2. Fasciotomy deep and superficial volar compartments, right forearm with fascial excision.  3. Ulnar nerve neurolysis and Guyon's canal release, right forearm.  4. Ulnar artery exploration and decompression right forearm. 09/03/15, Dr. Amedeo Plenty FEN: Carb modified ID: Day 7 Zosyn and Vancomycin DVT: SCD/ defer anticoagulation to Medicine/Orthopedics   Plan:   He can shower, get soap and water in open site, redress BID.  Defer antibiotics to Medicine and Dr.  Gramig. He has dressing change instructions along with follow up at our clinic in the [facility].  Pt noted now to have increasing site right shoulder.  We will I&D later today.     LOS: 5 days    [name] [name] 09/05/2015 [phone]

## 2015-09-05 NOTE — Progress Notes (Signed)
Procedure note:  I&D right shoulder abscess   Pt was positioned in bed with right shoulder up.  The site was cleaned and prepped with 3 betadine swabs.   3 Ml of 1% lidocaine with epinephrine was instilled into the area.  After adequate anesthesia, a 2 cm x 1 cm incision was made with a # 11 blade.  The wound was  2 cm deep. The incision was explored with a hemostat and broke up.  A fair amount of purulent fluid was drained from the site,.  We cultured the site and then we irrigated it with normal saline.  There was no bleeding and the site looked clean after the irrigation. It was packed with an open sterile 4 x 4.  We then applied a dry sterile dressing.  Pt tolerated the procedure very well.

## 2015-09-05 NOTE — Progress Notes (Addendum)
Patient ID: [name] [name], male   DOB: 1985/07/30, 30 y.o.   MRN: [id number] Patient is alert and oriented. Patient states his arm generally feels better.  He pulled his drain out this morning. There was scant output in the drain and all serosanguineous.  His white blood cell count still remains high.  I have changed his bandage today. His wound is generally clean dry and there is no erythema.  I have wrapped him in a sterile dressing and asked him to elevate and to not remove the dressing. I've stressed range of motion to the fingers as well as FDP and FDS isolation attempts  Cultures pend at present time regarding the infected intermuscular hematoma.  Would recommend awaiting the final culture results. Currently moderate staph aureus is growing. Sensitivities pend. Given his rare gram-negative rods on smear I would continue the antibiotic regime IV until cultures are completely final.  I will keep a close eye on the gentleman given the multiple areas of infection and unusual presentation of an infected intermuscular hematoma.  Vermelle Cammarata M.D.

## 2015-09-06 DIAGNOSIS — I152 Hypertension secondary to endocrine disorders: Secondary | ICD-10-CM | POA: Diagnosis present

## 2015-09-06 DIAGNOSIS — L02414 Cutaneous abscess of left upper limb: Principal | ICD-10-CM

## 2015-09-06 DIAGNOSIS — Z22322 Carrier or suspected carrier of Methicillin resistant Staphylococcus aureus: Secondary | ICD-10-CM

## 2015-09-06 DIAGNOSIS — I10 Essential (primary) hypertension: Secondary | ICD-10-CM | POA: Diagnosis present

## 2015-09-06 DIAGNOSIS — L0293 Carbuncle, unspecified: Secondary | ICD-10-CM | POA: Diagnosis present

## 2015-09-06 DIAGNOSIS — L02413 Cutaneous abscess of right upper limb: Secondary | ICD-10-CM | POA: Diagnosis present

## 2015-09-06 DIAGNOSIS — E1159 Type 2 diabetes mellitus with other circulatory complications: Secondary | ICD-10-CM | POA: Diagnosis present

## 2015-09-06 DIAGNOSIS — D649 Anemia, unspecified: Secondary | ICD-10-CM | POA: Diagnosis present

## 2015-09-06 LAB — CBC
HEMATOCRIT: 37.7 % — AB (ref 39.0–52.0)
HEMOGLOBIN: 12.9 g/dL — AB (ref 13.0–17.0)
MCH: 30.1 pg (ref 26.0–34.0)
MCHC: 34.2 g/dL (ref 30.0–36.0)
MCV: 88.1 fL (ref 78.0–100.0)
Platelets: 224 10*3/uL (ref 150–400)
RBC: 4.28 MIL/uL (ref 4.22–5.81)
RDW: 13.2 % (ref 11.5–15.5)
WBC: 11.8 10*3/uL — ABNORMAL HIGH (ref 4.0–10.5)

## 2015-09-06 LAB — BASIC METABOLIC PANEL
Anion gap: 8 (ref 5–15)
BUN: 18 mg/dL (ref 6–20)
CHLORIDE: 101 mmol/L (ref 101–111)
CO2: 25 mmol/L (ref 22–32)
Calcium: 8.7 mg/dL — ABNORMAL LOW (ref 8.9–10.3)
Creatinine, Ser: 0.97 mg/dL (ref 0.61–1.24)
GFR calc Af Amer: >60 mL/min (ref >60)
GFR calc non Af Amer: >60 mL/min (ref >60)
GLUCOSE: 176 mg/dL — AB (ref 65–99)
POTASSIUM: 4 mmol/L (ref 3.5–5.1)
Sodium: 134 mmol/L — ABNORMAL LOW (ref 135–145)

## 2015-09-06 LAB — GLUCOSE, CAPILLARY
Glucose-Capillary: 82 mg/dL (ref 65–99)
Glucose-Capillary: 164 mg/dL — ABNORMAL HIGH (ref 65–99)
Glucose-Capillary: 187 mg/dL — ABNORMAL HIGH (ref 65–99)
Glucose-Capillary: 116 mg/dL — ABNORMAL HIGH (ref 65–99)

## 2015-09-06 MED ORDER — GLUCERNA SHAKE PO LIQD
237.0000 mL | Freq: Two times a day (BID) | ORAL | Status: DC
  Administered 2015-09-06 – 2015-09-10 (×9): 237 mL via ORAL
  Filled 2015-09-06 (×11): qty 237

## 2015-09-06 NOTE — Consult Note (Signed)
[facility]    Date of Admission:  08/30/2015           Day 6 vancomycin        Day 6 piperacillin tazobactam       Reason for Consult: Multiple soft tissue abscesses    Referring Physician: Dr. Debbe Odea Primary Care Physician: None  Principal Problem:   MRSA (methicillin resistant staph aureus) culture positive Active Problems:   Abscess of left shoulder   Abscess of right arm   Abscess of right shoulder   Uncontrolled diabetes mellitus (HCC)   Normocytic anemia   Obesity, morbid (HCC)   Hypertension   Recurrent boils   . feeding supplement (GLUCERNA SHAKE)  237 mL Oral BID BM  . insulin aspart  0-20 Units Subcutaneous TID WC  . insulin aspart  0-5 Units Subcutaneous QHS  . insulin aspart  3 Units Subcutaneous TID WC  . insulin glargine  38 Units Subcutaneous BID  . mupirocin ointment   Topical BID  . piperacillin-tazobactam (ZOSYN)  IV  3.375 g Intravenous Q8H  . vancomycin  1,000 mg Intravenous Q8H    Recommendations: 1. Continue vancomycin overnight and then convert to oral doxycycline and treat for 10-14 more days. Optimal duration of therapy will depend on wound healing. 2. Discontinue piperacillin tazobactam  Assessment: [name] [name] has recurrent boils now complicated by more severe, multifocal soft tissue infections. I think the principal pathogen here is MRSA. Staph lugdunensis is likely a skin contaminant. I reviewed his operative Gram stain's and the rare gram-negative rods initially reported on his second culture was an incorrect reading that will be corrected. I do not think he has polymicrobial infection. I will stop piperacillin tazobactam now and continue vancomycin overnight. I would change to oral doxycycline 100 mg twice daily in the morning and plan on treating for 10-14 more days.   HPI: [name] [name] is a 30 y.o. male untreated and poorly controlled diabetes who said problem with recurrent boils over the last 8  years. He states that he usually has 1-2 boils each year and has been able to manage them at home without medical care. About 3 weeks ago he developed 2 fairly small boils on his left upper back. He states that he was able to squeeze pus out of them and they were getting better. He then developed a third, larger boil on his left upper back. About 2 weeks ago he got angry and hit a wall in his house with his right fist. He did not break the skin and thought nothing of it until 3 days later when he began to develop swelling and pain in his right forearm. This is what led him to come to the hospital where he was admitted on 08/31/2015. He was started on empiric vancomycin and piperacillin tazobactam. He was noted to have a large left shoulder abscess and tenderness of his right forearm. He underwent I&D of his left shoulder abscess on 08/31/2015. Gram stain showed gram-positive cocci in clusters and pairs. Cultures have grown MRSA and Staph lugdunensis (a coagulase-negative staph species). He continued to have more right forearm pain and underwent I&D of what was described as an infected hematoma on 09/03/2015. Gram stain showed gram-positive cocci in clusters and rare gram-negative rods. Cultures grew only MRSA. Yesterday he underwent bedside I&D of a smaller right shoulder abscess. Gram stain showed gram-positive cocci and cultures are growing Staph aureus with susceptibilities pending.  He is feeling much better today. His surgeons have indicated that they are comfortable with converting him to oral antibiotic therapy and discharging home soon. He recently separated from his wife and has a court order to stay away from her. He had been living in his truck but moved back in with his wife temporarily when he got sick. He is not sure where he will be living after discharge. 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 He has no source for medical care to help him manage his  diabetes.   Review of Systems: Review of Systems  Constitutional: Positive for fever, chills and malaise/fatigue. Negative for weight loss and diaphoresis.  HENT: Negative for sore throat.   Respiratory: Negative for cough, sputum production and shortness of breath.   Cardiovascular: Negative for chest pain.  Gastrointestinal: Negative for heartburn, nausea, vomiting, abdominal pain and diarrhea.  Genitourinary: Negative for dysuria.  Musculoskeletal: Negative for myalgias and joint pain.  Skin: Positive for rash. Negative for itching.       He noticed a small area of red rash under his left breast 3 days ago.  Neurological: Negative for weakness and headaches.    Past Medical History  Diagnosis Date  . Diabetes mellitus without complication ([place])   . Asthma   . Hypertension     Social History  Substance Use Topics  . Smoking status: Never Smoker   . Smokeless tobacco: Never Used  . Alcohol Use: No    History reviewed. No pertinent family history. No Known Allergies  OBJECTIVE: Blood pressure 149/96, pulse 81, temperature 97.8 F (36.6 C), temperature source Oral, resp. rate 18, height 5\' 7"  (1.702 m), weight 225 lb (102.059 kg), SpO2 99 %.  Physical Exam  Constitutional: He is oriented to person, place, and time.  He is smiling and in good spirits. He is resting quietly in bed watching television.  HENT:  Mouth/Throat: No oropharyngeal exudate.  Cardiovascular: Normal rate and regular rhythm.   No murmur heard. Pulmonary/Chest: Effort normal and breath sounds normal. He has no wheezes. He has no rales.  Abdominal: Soft. He exhibits no distension.  Musculoskeletal: Normal range of motion. He exhibits no edema or tenderness.  He has clean dry gauze dressings on the wounds on his upper back. His right forearm is in an Ace wrap. He has no tenderness of these areas.  Neurological: He is alert and oriented to person, place, and time.  Skin:  He has a circular area of  redness under his left breast compatible with intertrigo.  Psychiatric: Mood and affect normal.    Lab Results Lab Results  Component Value Date   WBC 11.8* 09/06/2015   HGB 12.9* 09/06/2015   HCT 37.7* 09/06/2015   MCV 88.1 09/06/2015   PLT 224 09/06/2015    Lab Results  Component Value Date   CREATININE 0.97 09/06/2015   BUN 18 09/06/2015   NA 134* 09/06/2015   K 4.0 09/06/2015   CL 101 09/06/2015   CO2 25 09/06/2015    Lab Results  Component Value Date   ALT 15* 09/01/2015   AST 16 09/01/2015   ALKPHOS 87 09/01/2015   BILITOT 0.9 09/01/2015     Microbiology: Recent Results (from the past 240 hour(s))  Culture, blood (Routine X 2) w Reflex to ID Panel     Status: None   Collection Time: 08/31/15  1:03 AM  Result Value Ref Range Status   Specimen Description BLOOD RIGHT  ANTECUBITAL  Final   Special Requests IN PEDIATRIC BOTTLE 4ML  Final   Culture   Final    NO GROWTH 5 DAYS Performed at [facility]    Report Status 09/05/2015 FINAL  Final  Culture, blood (Routine X 2) w Reflex to ID Panel     Status: None   Collection Time: 08/31/15  1:11 AM  Result Value Ref Range Status   Specimen Description BLOOD LEFT ANTECUBITAL  Final   Special Requests BOTTLES DRAWN AEROBIC AND ANAEROBIC 5ML  Final   Culture   Final    NO GROWTH 5 DAYS Performed at [facility]    Report Status 09/05/2015 FINAL  Final  Surgical pcr screen     Status: None   Collection Time: 08/31/15 11:00 AM  Result Value Ref Range Status   MRSA, PCR NEGATIVE NEGATIVE Final   Staphylococcus aureus NEGATIVE NEGATIVE Final    Comment:        The Xpert SA Assay (FDA approved for NASAL specimens in patients over 46 years of age), is one component of a comprehensive surveillance program.  Test performance has been validated by [facility] for patients greater than or equal to 19 year old. It is not intended to diagnose infection nor to guide or monitor treatment.    Aerobic/Anaerobic Culture (surgical/deep wound)     Status: None   Collection Time: 08/31/15 12:40 PM  Result Value Ref Range Status   Specimen Description ABSCESS LEFT SHOULDER IRRIGATION & DEBRIDEMENT  Final   Special Requests NONE  Final   Gram Stain   Final    ABUNDANT WBC PRESENT, PREDOMINANTLY PMN MODERATE GRAM POSITIVE COCCI IN CLUSTERS IN PAIRS    Culture   Final    MODERATE METHICILLIN RESISTANT STAPHYLOCOCCUS AUREUS MODERATE STAPHYLOCOCCUS LUGDUNENSIS NO ANAEROBES ISOLATED Performed at [facility]    Report Status 09/05/2015 FINAL  Final   Organism ID, Bacteria METHICILLIN RESISTANT STAPHYLOCOCCUS AUREUS  Final   Organism ID, Bacteria STAPHYLOCOCCUS LUGDUNENSIS  Final      Susceptibility   Staphylococcus lugdunensis - MIC*    CIPROFLOXACIN <=0.5 SENSITIVE Sensitive     ERYTHROMYCIN <=0.25 SENSITIVE Sensitive     GENTAMICIN <=0.5 SENSITIVE Sensitive     OXACILLIN 2 SENSITIVE Sensitive     TETRACYCLINE <=1 SENSITIVE Sensitive     VANCOMYCIN <=0.5 SENSITIVE Sensitive     TRIMETH/SULFA <=10 SENSITIVE Sensitive     CLINDAMYCIN <=0.25 SENSITIVE Sensitive     RIFAMPIN <=0.5 SENSITIVE Sensitive     Inducible Clindamycin NEGATIVE Sensitive     * MODERATE STAPHYLOCOCCUS LUGDUNENSIS   Methicillin resistant staphylococcus aureus - MIC*    CIPROFLOXACIN <=0.5 SENSITIVE Sensitive     ERYTHROMYCIN >=8 RESISTANT Resistant     GENTAMICIN <=0.5 SENSITIVE Sensitive     OXACILLIN >=4 RESISTANT Resistant     TETRACYCLINE <=1 SENSITIVE Sensitive     VANCOMYCIN <=0.5 SENSITIVE Sensitive     TRIMETH/SULFA <=10 SENSITIVE Sensitive     CLINDAMYCIN <=0.25 SENSITIVE Sensitive     RIFAMPIN <=0.5 SENSITIVE Sensitive     Inducible Clindamycin NEGATIVE Sensitive     * MODERATE METHICILLIN RESISTANT STAPHYLOCOCCUS AUREUS  Aerobic/Anaerobic Culture (surgical/deep wound)     Status: None (Preliminary result)   Collection Time: 09/03/15 10:40 AM  Result Value Ref Range Status   Specimen  Description ABSCESS RIGHT ARM  Final   Special Requests NONE  Final   Gram Stain   Final    MODERATE WBC PRESENT, PREDOMINANTLY PMN  RARE SQUAMOUS EPITHELIAL CELLS PRESENT FEW GRAM POSITIVE COCCI IN CLUSTERS RARE GRAM NEGATIVE RODS Performed at [facility]    Culture   Final    MODERATE METHICILLIN RESISTANT STAPHYLOCOCCUS AUREUS NO ANAEROBES ISOLATED; CULTURE IN PROGRESS FOR 5 DAYS    Report Status PENDING  Incomplete   Organism ID, Bacteria METHICILLIN RESISTANT STAPHYLOCOCCUS AUREUS  Final      Susceptibility   Methicillin resistant staphylococcus aureus - MIC*    CIPROFLOXACIN <=0.5 SENSITIVE Sensitive     ERYTHROMYCIN >=8 RESISTANT Resistant     GENTAMICIN <=0.5 SENSITIVE Sensitive     OXACILLIN >=4 RESISTANT Resistant     TETRACYCLINE <=1 SENSITIVE Sensitive     VANCOMYCIN 1 SENSITIVE Sensitive     TRIMETH/SULFA <=10 SENSITIVE Sensitive     CLINDAMYCIN <=0.25 SENSITIVE Sensitive     RIFAMPIN <=0.5 SENSITIVE Sensitive     Inducible Clindamycin NEGATIVE Sensitive     * MODERATE METHICILLIN RESISTANT STAPHYLOCOCCUS AUREUS  Aerobic Culture (superficial specimen)     Status: None (Preliminary result)   Collection Time: 09/05/15 12:44 PM  Result Value Ref Range Status   Specimen Description WOUND RIGHT SHOULDER  Final   Special Requests NONE  Final   Gram Stain   Final    WBC PRESENT,BOTH PMN AND MONONUCLEAR ABUNDANT GRAM POSITIVE COCCI CRITICAL RESULT CALLED TO, READ BACK BY AND VERIFIED WITH: R.RIMANDO,RN 09/05/15 @2040  BY V.WILKINS CONFIRMED BY W.JOHNSON Performed at [place]  Final   Report Status PENDING  Incomplete    Michel Bickers, MD [facility] [place] [facility] [phone] pager   [phone] cell 09/06/2015, 5:27 PM

## 2015-09-06 NOTE — Progress Notes (Addendum)
PROGRESS NOTE    [name] [name]  [id number] DOB: 11-17-1985 DOA: 08/30/2015  PCP: No primary care provider on file.   Brief Narrative:  30 year old with hypertension, diabetes who is not on medication and asthma presented on 6/13 with an abscess on his left shoulder. Was admitted by general surgery with drainage of the abscess. Sugars were severely uncontrolled and internal medicine was consulted. He was eventually transferred to internal medicine. Subsequently right hand was noted to become swollen and therefore hand surgery was consulted. He was found to have an infected hematoma and taken to the OR for an I & D of the hand. Culture from shoulder growing MRSA and staph lugdenensis. Subsequently noted to have a small ulcer on right shoulder- I and D done on 6/19 at bedside.   Subjective: No new complaints. No nausea, vomiting or diarrhea.   Assessment & Plan:   Principal Problem:   Abscess of left shoulder and right hand -Culture from left shoulder Growing staph Lugdunensis and MRSA as mentioned above-continue vancomycin and Zosyn- still has copious drainage from ulcer - wound cultures from right hand growing MRSA and small amount of negative rods  - New right shoulder abscess drained by gen surgery-   gr + cocci as well -Blood cultures negative  - have consulted ID  Active Problems:   Uncontrolled diabetes mellitus  -Unable to afford medications and unable to afford a PCP - Hemoglobin A1c 13.7 - sugars much better controlled today  AKI - Cr high yesterday - improved today- did not appear to be prerenal   Homeless - no health insurance - social work/ case management aware   DVT prophylaxis: SCDs Code Status: Full code Family Communication:  Disposition Plan: home  Consultants:   gen surgery and hand surgery Procedures:   I and D  Antimicrobials:  Anti-infectives    Start     Dose/Rate Route Frequency Ordered Stop   09/04/15 1600  vancomycin (VANCOCIN) IVPB 1000  mg/200 mL premix     1,000 mg 200 mL/hr over 60 Minutes Intravenous Every 8 hours 09/04/15 0913     09/04/15 1000  vancomycin (VANCOCIN) IVPB 1000 mg/200 mL premix  Status:  Discontinued     1,000 mg 200 mL/hr over 60 Minutes Intravenous Every 8 hours 09/04/15 0910 09/04/15 0913   09/04/15 0915  vancomycin (VANCOCIN) 1,250 mg in sodium chloride 0.9 % 250 mL IVPB     1,250 mg 166.7 mL/hr over 90 Minutes Intravenous  Once 09/04/15 0913 09/04/15 1045   09/03/15 1022  polymyxin B 500,000 Units, bacitracin 50,000 Units in sodium chloride irrigation 0.9 % 500 mL irrigation  Status:  Discontinued       As needed 09/03/15 1022 09/03/15 1058   09/03/15 0800  piperacillin-tazobactam (ZOSYN) IVPB 3.375 g     3.375 g 12.5 mL/hr over 240 Minutes Intravenous Every 8 hours 09/02/15 2345     09/01/15 1600  vancomycin (VANCOCIN) 1,250 mg in sodium chloride 0.9 % 250 mL IVPB  Status:  Discontinued     1,250 mg 166.7 mL/hr over 90 Minutes Intravenous Every 8 hours 09/01/15 1551 09/04/15 0910   08/31/15 0800  piperacillin-tazobactam (ZOSYN) IVPB 3.375 g  Status:  Discontinued     3.375 g 12.5 mL/hr over 240 Minutes Intravenous Every 8 hours 08/31/15 0532 09/02/15 2345   08/31/15 0800  vancomycin (VANCOCIN) IVPB 1000 mg/200 mL premix  Status:  Discontinued     1,000 mg 200 mL/hr over 60 Minutes Intravenous Every 8 hours 08/31/15  0532 09/01/15 1551   08/31/15 0115  vancomycin (VANCOCIN) IVPB 1000 mg/200 mL premix     1,000 mg 200 mL/hr over 60 Minutes Intravenous  Once 08/31/15 0110 08/31/15 0407   08/31/15 0115  piperacillin-tazobactam (ZOSYN) IVPB 3.375 g     3.375 g 100 mL/hr over 30 Minutes Intravenous  Once 08/31/15 0110 08/31/15 0256       Objective: Filed Vitals:   09/05/15 1326 09/05/15 2103 09/06/15 0449 09/06/15 1453  BP: 155/85 156/89 163/81 149/96  Pulse: 82 83 78 81  Temp: 98.7 F (37.1 C) 97.6 F (36.4 C) 97.7 F (36.5 C) 97.8 F (36.6 C)  TempSrc: Oral Oral Oral Oral  Resp: 16 16  16 18   Height:      Weight:      SpO2: 100% 98% 100% 99%    Intake/Output Summary (Last 24 hours) at 09/06/15 1623 Last data filed at 09/06/15 1452  Gross per 24 hour  Intake   4470 ml  Output      0 ml  Net   4470 ml   Filed Weights   08/31/15 0129  Weight: 102.059 kg (225 lb)    Examination: General exam: Appears comfortable  HEENT: PERRLA, oral mucosa moist, no sclera icterus or thrush Respiratory system: Clear to auscultation. Respiratory effort normal. Cardiovascular system: S1 & S2 heard, RRR.  No murmurs  Gastrointestinal system: Abdomen soft, non-tender, nondistended. Normal bowel sound. No organomegaly Central nervous system: Alert and oriented. No focal neurological deficits. Extremities: No cyanosis, clubbing or edema Skin: large ulcer on post aspect L shoulder draining serosanguinous fluid- right hand bandaged.- smaller ulcer on post aspect of right shoulder with packing.   Psychiatry:  Mood & affect appropriate.     Data Reviewed: I have personally reviewed following labs and imaging studies  CBC:  Recent Labs Lab 08/30/15 2307  09/02/15 0951 09/03/15 0925 09/04/15 0700 09/05/15 0429 09/06/15 0450  WBC 16.9*  < > 16.1* 15.8* 17.2* 16.1* 11.8*  NEUTROABS 13.4*  --  12.7*  --   --   --   --   HGB 14.8  < > 13.0 12.9* 11.1* 11.8* 12.9*  HCT 40.5  < > 35.8* 36.1* 32.7* 34.9* 37.7*  MCV 82.0  < > 82.3 83.0 86.5 85.5 88.1  PLT 273  < > 266 289 263 296 224  < > = values in this interval not displayed. Basic Metabolic Panel:  Recent Labs Lab 08/31/15 1020  09/02/15 0951 09/03/15 0925 09/04/15 0700 09/04/15 0755 09/05/15 0429 09/06/15 0450  NA  --   < > 134* 139 139  --  139 134*  K  --   < > 3.8 3.6 3.3*  --  3.2* 4.0  CL  --   < > 98* 105 104  --  104 101  CO2  --   < > 26 26 29   --  28 25  GLUCOSE  --   < > 275* 206* 146*  --  104* 176*  BUN  --   < > 8 6 <5*  --  6 18  CREATININE 0.65  < > 0.81 0.93 0.88 0.88 1.41* 0.97  CALCIUM  --   < >  8.9 9.0 8.5*  --  8.7* 8.7*  MG 1.9  --  1.9  --   --   --   --   --   PHOS 3.6  --  3.8  --   --   --   --   --   < > =  values in this interval not displayed. GFR: Estimated Creatinine Clearance: 127.9 mL/min (by C-G formula based on Cr of 0.97). Liver Function Tests:  Recent Labs Lab 08/30/15 2307 09/01/15 0446 09/02/15 0951  AST 12* 16  --   ALT 18 15*  --   ALKPHOS 95 87  --   BILITOT 0.7 0.9  --   PROT 7.7 6.0*  --   ALBUMIN 3.8 2.9* 3.1*   No results for input(s): LIPASE, AMYLASE in the last 168 hours. No results for input(s): AMMONIA in the last 168 hours. Coagulation Profile: No results for input(s): INR, PROTIME in the last 168 hours. Cardiac Enzymes: No results for input(s): CKTOTAL, CKMB, CKMBINDEX, TROPONINI in the last 168 hours. BNP (last 3 results) No results for input(s): PROBNP in the last 8760 hours. HbA1C: No results for input(s): HGBA1C in the last 72 hours. CBG:  Recent Labs Lab 09/05/15 1228 09/05/15 1715 09/05/15 2221 09/06/15 0749 09/06/15 1147  GLUCAP 125* 92 117* 82 164*   Lipid Profile: No results for input(s): CHOL, HDL, LDLCALC, TRIG, CHOLHDL, LDLDIRECT in the last 72 hours. Thyroid Function Tests: No results for input(s): TSH, T4TOTAL, FREET4, T3FREE, THYROIDAB in the last 72 hours. Anemia Panel: No results for input(s): VITAMINB12, FOLATE, FERRITIN, TIBC, IRON, RETICCTPCT in the last 72 hours. Urine analysis: No results found for: COLORURINE, APPEARANCEUR, LABSPEC, [place], GLUCOSEU, HGBUR, BILIRUBINUR, KETONESUR, PROTEINUR, UROBILINOGEN, NITRITE, LEUKOCYTESUR Sepsis Labs: @LABRCNTIP (procalcitonin:4,lacticidven:4) ) Recent Results (from the past 240 hour(s))  Culture, blood (Routine X 2) w Reflex to ID Panel     Status: None   Collection Time: 08/31/15  1:03 AM  Result Value Ref Range Status   Specimen Description BLOOD RIGHT ANTECUBITAL  Final   Special Requests IN PEDIATRIC BOTTLE 4ML  Final   Culture   Final    NO GROWTH 5  DAYS Performed at [facility]    Report Status 09/05/2015 FINAL  Final  Culture, blood (Routine X 2) w Reflex to ID Panel     Status: None   Collection Time: 08/31/15  1:11 AM  Result Value Ref Range Status   Specimen Description BLOOD LEFT ANTECUBITAL  Final   Special Requests BOTTLES DRAWN AEROBIC AND ANAEROBIC 5ML  Final   Culture   Final    NO GROWTH 5 DAYS Performed at [facility]    Report Status 09/05/2015 FINAL  Final  Surgical pcr screen     Status: None   Collection Time: 08/31/15 11:00 AM  Result Value Ref Range Status   MRSA, PCR NEGATIVE NEGATIVE Final   Staphylococcus aureus NEGATIVE NEGATIVE Final    Comment:        The Xpert SA Assay (FDA approved for NASAL specimens in patients over 29 years of age), is one component of a comprehensive surveillance program.  Test performance has been validated by [facility] for patients greater than or equal to 83 year old. It is not intended to diagnose infection nor to guide or monitor treatment.   Aerobic/Anaerobic Culture (surgical/deep wound)     Status: None   Collection Time: 08/31/15 12:40 PM  Result Value Ref Range Status   Specimen Description ABSCESS LEFT SHOULDER IRRIGATION & DEBRIDEMENT  Final   Special Requests NONE  Final   Gram Stain   Final    ABUNDANT WBC PRESENT, PREDOMINANTLY PMN MODERATE GRAM POSITIVE COCCI IN CLUSTERS IN PAIRS    Culture   Final    MODERATE METHICILLIN RESISTANT STAPHYLOCOCCUS AUREUS MODERATE STAPHYLOCOCCUS LUGDUNENSIS NO  ANAEROBES ISOLATED Performed at [facility]    Report Status 09/05/2015 FINAL  Final   Organism ID, Bacteria METHICILLIN RESISTANT STAPHYLOCOCCUS AUREUS  Final   Organism ID, Bacteria STAPHYLOCOCCUS LUGDUNENSIS  Final      Susceptibility   Staphylococcus lugdunensis - MIC*    CIPROFLOXACIN <=0.5 SENSITIVE Sensitive     ERYTHROMYCIN <=0.25 SENSITIVE Sensitive     GENTAMICIN <=0.5 SENSITIVE Sensitive     OXACILLIN 2 SENSITIVE  Sensitive     TETRACYCLINE <=1 SENSITIVE Sensitive     VANCOMYCIN <=0.5 SENSITIVE Sensitive     TRIMETH/SULFA <=10 SENSITIVE Sensitive     CLINDAMYCIN <=0.25 SENSITIVE Sensitive     RIFAMPIN <=0.5 SENSITIVE Sensitive     Inducible Clindamycin NEGATIVE Sensitive     * MODERATE STAPHYLOCOCCUS LUGDUNENSIS   Methicillin resistant staphylococcus aureus - MIC*    CIPROFLOXACIN <=0.5 SENSITIVE Sensitive     ERYTHROMYCIN >=8 RESISTANT Resistant     GENTAMICIN <=0.5 SENSITIVE Sensitive     OXACILLIN >=4 RESISTANT Resistant     TETRACYCLINE <=1 SENSITIVE Sensitive     VANCOMYCIN <=0.5 SENSITIVE Sensitive     TRIMETH/SULFA <=10 SENSITIVE Sensitive     CLINDAMYCIN <=0.25 SENSITIVE Sensitive     RIFAMPIN <=0.5 SENSITIVE Sensitive     Inducible Clindamycin NEGATIVE Sensitive     * MODERATE METHICILLIN RESISTANT STAPHYLOCOCCUS AUREUS  Aerobic/Anaerobic Culture (surgical/deep wound)     Status: None (Preliminary result)   Collection Time: 09/03/15 10:40 AM  Result Value Ref Range Status   Specimen Description ABSCESS RIGHT ARM  Final   Special Requests NONE  Final   Gram Stain   Final    MODERATE WBC PRESENT, PREDOMINANTLY PMN RARE SQUAMOUS EPITHELIAL CELLS PRESENT FEW GRAM POSITIVE COCCI IN CLUSTERS RARE GRAM NEGATIVE RODS Performed at [facility]    Culture   Final    MODERATE METHICILLIN RESISTANT STAPHYLOCOCCUS AUREUS NO ANAEROBES ISOLATED; CULTURE IN PROGRESS FOR 5 DAYS    Report Status PENDING  Incomplete   Organism ID, Bacteria METHICILLIN RESISTANT STAPHYLOCOCCUS AUREUS  Final      Susceptibility   Methicillin resistant staphylococcus aureus - MIC*    CIPROFLOXACIN <=0.5 SENSITIVE Sensitive     ERYTHROMYCIN >=8 RESISTANT Resistant     GENTAMICIN <=0.5 SENSITIVE Sensitive     OXACILLIN >=4 RESISTANT Resistant     TETRACYCLINE <=1 SENSITIVE Sensitive     VANCOMYCIN 1 SENSITIVE Sensitive     TRIMETH/SULFA <=10 SENSITIVE Sensitive     CLINDAMYCIN <=0.25 SENSITIVE Sensitive      RIFAMPIN <=0.5 SENSITIVE Sensitive     Inducible Clindamycin NEGATIVE Sensitive     * MODERATE METHICILLIN RESISTANT STAPHYLOCOCCUS AUREUS  Aerobic Culture (superficial specimen)     Status: None (Preliminary result)   Collection Time: 09/05/15 12:44 PM  Result Value Ref Range Status   Specimen Description WOUND RIGHT SHOULDER  Final   Special Requests NONE  Final   Gram Stain   Final    WBC PRESENT,BOTH PMN AND MONONUCLEAR ABUNDANT GRAM POSITIVE COCCI CRITICAL RESULT CALLED TO, READ BACK BY AND VERIFIED WITH: R.RIMANDO,RN 09/05/15 @2040  BY V.WILKINS CONFIRMED BY W.JOHNSON Performed at [place]  Final   Report Status PENDING  Incomplete         Radiology Studies: No results found.    Scheduled Meds: . insulin aspart  0-20 Units Subcutaneous TID WC  . insulin aspart  0-5 Units Subcutaneous QHS  . insulin aspart  3 Units  Subcutaneous TID WC  . insulin glargine  38 Units Subcutaneous BID  . mupirocin ointment   Topical BID  . piperacillin-tazobactam (ZOSYN)  IV  3.375 g Intravenous Q8H  . vancomycin  1,000 mg Intravenous Q8H   Continuous Infusions: . sodium chloride 100 mL/hr at 09/06/15 1355     LOS: 6 days    Time spent in minutes: 53    [place], MD [facility] Pager: [email or web address] Password [facility] 09/06/2015, 4:23 PM

## 2015-09-06 NOTE — Progress Notes (Signed)
3 Days Post-Op  Subjective: Both sites look fine.  He says he may have to go to Shelter after he leaves here.    Objective: Vital signs in last 24 hours: Temp:  [97.6 F (36.4 C)-98.7 F (37.1 C)] 97.7 F (36.5 C) (06/20 0449) Pulse Rate:  [78-83] 78 (06/20 0449) Resp:  [16] 16 (06/20 0449) BP: (155-163)/(81-89) 163/81 mmHg (06/20 0449) SpO2:  [98 %-100 %] 100 % (06/20 0449)  1800 PO Voided x 5 Afebrile, VSS WBC down to 1.8 Creatinine is down to .97 No films.   Intake/Output from previous day: 06/19 0701 - 06/20 0700 In: 5100 [P.O.:1800; I.V.:2400; IV Piggyback:900] Out: -  Intake/Output this shift:    General appearance: alert, cooperative, no distress and complaining of his neck pain.   Skin: Skin color, texture, turgor normal. No rashes or lesions or both open sites are clean and look good.  Lab Results:   Recent Labs  09/05/15 0429 09/06/15 0450  WBC 16.1* 11.8*  HGB 11.8* 12.9*  HCT 34.9* 37.7*  PLT 296 224    BMET  Recent Labs  09/05/15 0429 09/06/15 0450  NA 139 134*  K 3.2* 4.0  CL 104 101  CO2 28 25  GLUCOSE 104* 176*  BUN 6 18  CREATININE 1.41* 0.97  CALCIUM 8.7* 8.7*   PT/INR No results for input(s): LABPROT, INR in the last 72 hours.   Recent Labs Lab 08/30/15 2307 09/01/15 0446 09/02/15 0951  AST 12* 16  --   ALT 18 15*  --   ALKPHOS 95 87  --   BILITOT 0.7 0.9  --   PROT 7.7 6.0*  --   ALBUMIN 3.8 2.9* 3.1*     Lipase  No results found for: LIPASE   Studies/Results: No results found.  Medications: . insulin aspart  0-20 Units Subcutaneous TID WC  . insulin aspart  0-5 Units Subcutaneous QHS  . insulin aspart  3 Units Subcutaneous TID WC  . insulin glargine  38 Units Subcutaneous BID  . mupirocin ointment   Topical BID  . piperacillin-tazobactam (ZOSYN)  IV  3.375 g Intravenous Q8H  . vancomycin  1,000 mg Intravenous Q8H    Assessment/Plan Abcess left shoulder (6 x 12 cm)  IRRIGATION AND DEBRIDEMENT  ABSCESS LEFT SHOULDER, 08/31/15, Dr. Lucia Gaskins MRSA grown from the shoulder culture Small right shoulder abscess, bedside I&D 09/05/15  -  GM positive cocci in Gram stain Uncontrolled diabetes Glucose - improving  Infected hematoma of FCU muscle belly, right forearm. S/p 1. Irrigation and debridement of deep abscess, right forearm. 2. Fasciotomy deep and superficial volar compartments, right forearm with fascial excision. 3. Ulnar nerve neurolysis and Guyon's canal release, right forearm. 4. Ulnar artery exploration and decompression right forearm. 09/03/15, Dr. Amedeo Plenty FEN: Carb modified ID: Day 8 Zosyn and Vancomycin DVT: SCD/ defer anticoagulation to Medicine/Orthopedics  Plan:  He has follow up in the AVS, he will need to be some place he can shower and get open sites repacked.        LOS: 6 days    [name] [name] 09/06/2015 [phone]

## 2015-09-06 NOTE — Progress Notes (Signed)
Subjective: 3 Days Post-Op Procedure(s) (LRB): INCISION AND DRAINAGE ABSCESS (Right) Patient reports pain as minimal. He is having no difficulties with the wrist at this point in time. He states the fingers are still somewhat stiff. We have discussed all issues with the patient regards to his wrist. He denies nausea, vomiting, fever or chills.  Objective: Vital signs in last 24 hours: Temp:  [97.6 F (36.4 C)-97.7 F (36.5 C)] 97.7 F (36.5 C) (06/20 0449) Pulse Rate:  [78-83] 78 (06/20 0449) Resp:  [16] 16 (06/20 0449) BP: (156-163)/(81-89) 163/81 mmHg (06/20 0449) SpO2:  [98 %-100 %] 100 % (06/20 0449)  Intake/Output from previous day: 06/19 0701 - 06/20 0700 In: 5100 [P.O.:1800; I.V.:2400; IV Piggyback:900] Out: -  Intake/Output this shift: Total I/O In: 650 [I.V.:400; IV Piggyback:250] Out: -    Recent Labs  09/04/15 0700 09/05/15 0429 09/06/15 0450  HGB 11.1* 11.8* 12.9*    Recent Labs  09/05/15 0429 09/06/15 0450  WBC 16.1* 11.8*  RBC 4.08* 4.28  HCT 34.9* 37.7*  PLT 296 224    Recent Labs  09/05/15 0429 09/06/15 0450  NA 139 134*  K 3.2* 4.0  CL 104 101  CO2 28 25  BUN 6 18  CREATININE 1.41* 0.97  GLUCOSE 104* 176*  CALCIUM 8.7* 8.7*   No results for input(s): LABPT, INR in the last 72 hours. Results for orders placed or performed during the hospital encounter of 08/30/15  Culture, blood (Routine X 2) w Reflex to ID Panel     Status: None   Collection Time: 08/31/15  1:03 AM  Result Value Ref Range Status   Specimen Description BLOOD RIGHT ANTECUBITAL  Final   Special Requests IN PEDIATRIC BOTTLE 4ML  Final   Culture   Final    NO GROWTH 5 DAYS Performed at [facility]    Report Status 09/05/2015 FINAL  Final  Culture, blood (Routine X 2) w Reflex to ID Panel     Status: None   Collection Time: 08/31/15  1:11 AM  Result Value Ref Range Status   Specimen Description BLOOD LEFT ANTECUBITAL  Final   Special Requests BOTTLES DRAWN  AEROBIC AND ANAEROBIC 5ML  Final   Culture   Final    NO GROWTH 5 DAYS Performed at [facility]    Report Status 09/05/2015 FINAL  Final  Surgical pcr screen     Status: None   Collection Time: 08/31/15 11:00 AM  Result Value Ref Range Status   MRSA, PCR NEGATIVE NEGATIVE Final   Staphylococcus aureus NEGATIVE NEGATIVE Final    Comment:        The Xpert SA Assay (FDA approved for NASAL specimens in patients over 54 years of age), is one component of a comprehensive surveillance program.  Test performance has been validated by [facility] for patients greater than or equal to 68 year old. It is not intended to diagnose infection nor to guide or monitor treatment.   Aerobic/Anaerobic Culture (surgical/deep wound)     Status: None   Collection Time: 08/31/15 12:40 PM  Result Value Ref Range Status   Specimen Description ABSCESS LEFT SHOULDER IRRIGATION & DEBRIDEMENT  Final   Special Requests NONE  Final   Gram Stain   Final    ABUNDANT WBC PRESENT, PREDOMINANTLY PMN MODERATE GRAM POSITIVE COCCI IN CLUSTERS IN PAIRS    Culture   Final    MODERATE METHICILLIN RESISTANT STAPHYLOCOCCUS AUREUS MODERATE STAPHYLOCOCCUS LUGDUNENSIS NO ANAEROBES ISOLATED Performed at [facility]    Report Status 09/05/2015 FINAL  Final   Organism ID, Bacteria METHICILLIN RESISTANT STAPHYLOCOCCUS AUREUS  Final   Organism ID, Bacteria STAPHYLOCOCCUS LUGDUNENSIS  Final      Susceptibility   Staphylococcus lugdunensis - MIC*    CIPROFLOXACIN <=0.5 SENSITIVE Sensitive     ERYTHROMYCIN <=0.25 SENSITIVE Sensitive     GENTAMICIN <=0.5 SENSITIVE Sensitive     OXACILLIN 2 SENSITIVE Sensitive     TETRACYCLINE <=1 SENSITIVE Sensitive     VANCOMYCIN <=0.5 SENSITIVE Sensitive     TRIMETH/SULFA <=10 SENSITIVE Sensitive     CLINDAMYCIN <=0.25 SENSITIVE Sensitive     RIFAMPIN <=0.5 SENSITIVE Sensitive     Inducible Clindamycin NEGATIVE Sensitive     * MODERATE STAPHYLOCOCCUS LUGDUNENSIS    Methicillin resistant staphylococcus aureus - MIC*    CIPROFLOXACIN <=0.5 SENSITIVE Sensitive     ERYTHROMYCIN >=8 RESISTANT Resistant     GENTAMICIN <=0.5 SENSITIVE Sensitive     OXACILLIN >=4 RESISTANT Resistant     TETRACYCLINE <=1 SENSITIVE Sensitive     VANCOMYCIN <=0.5 SENSITIVE Sensitive     TRIMETH/SULFA <=10 SENSITIVE Sensitive     CLINDAMYCIN <=0.25 SENSITIVE Sensitive     RIFAMPIN <=0.5 SENSITIVE Sensitive     Inducible Clindamycin NEGATIVE Sensitive     * MODERATE METHICILLIN RESISTANT STAPHYLOCOCCUS AUREUS  Aerobic/Anaerobic Culture (surgical/deep wound)     Status: None (Preliminary result)   Collection Time: 09/03/15 10:40 AM  Result Value Ref Range Status   Specimen Description ABSCESS RIGHT ARM  Final   Special Requests NONE  Final   Gram Stain   Final    MODERATE WBC PRESENT, PREDOMINANTLY PMN RARE SQUAMOUS EPITHELIAL CELLS PRESENT FEW GRAM POSITIVE COCCI IN CLUSTERS RARE GRAM NEGATIVE RODS Performed at [facility]    Culture   Final    MODERATE METHICILLIN RESISTANT STAPHYLOCOCCUS AUREUS NO ANAEROBES ISOLATED; CULTURE IN PROGRESS FOR 5 DAYS    Report Status PENDING  Incomplete   Organism ID, Bacteria METHICILLIN RESISTANT STAPHYLOCOCCUS AUREUS  Final      Susceptibility   Methicillin resistant staphylococcus aureus - MIC*    CIPROFLOXACIN <=0.5 SENSITIVE Sensitive     ERYTHROMYCIN >=8 RESISTANT Resistant     GENTAMICIN <=0.5 SENSITIVE Sensitive     OXACILLIN >=4 RESISTANT Resistant     TETRACYCLINE <=1 SENSITIVE Sensitive     VANCOMYCIN 1 SENSITIVE Sensitive     TRIMETH/SULFA <=10 SENSITIVE Sensitive     CLINDAMYCIN <=0.25 SENSITIVE Sensitive     RIFAMPIN <=0.5 SENSITIVE Sensitive     Inducible Clindamycin NEGATIVE Sensitive     * MODERATE METHICILLIN RESISTANT STAPHYLOCOCCUS AUREUS  Aerobic Culture (superficial specimen)     Status: None (Preliminary result)   Collection Time: 09/05/15 12:44 PM  Result Value Ref Range Status   Specimen  Description WOUND RIGHT SHOULDER  Final   Special Requests NONE  Final   Gram Stain   Final    WBC PRESENT,BOTH PMN AND MONONUCLEAR ABUNDANT GRAM POSITIVE COCCI CRITICAL RESULT CALLED TO, READ BACK BY AND VERIFIED WITH: R.RIMANDO,RN 09/05/15 @2040  BY V.WILKINS CONFIRMED BY W.JOHNSON Performed at [place]  Final   Report Status PENDING  Incomplete   Examination of the right upper extremity: Dressings are removed. His sutures are intact there is no signs of cellulitis he has no signs of recurring abscess he has no erythema or warmth overall the wound and wrist/forearm region is very stable and looks excellent.  Assessment/Plan:  3 Days Post-Op Procedure(s) (LRB): INCISION AND DRAINAGE ABSCESS (Right) We will continue to follow in regards to the right wrist. Overall, the patient is stable in regards to the wrist and his wound looks excellent this juncture. We will plan to do a wound check in a week and suture removal in approximately 10-12 days, however, as the patient remained inpatient we will continue to follow along. Medically stable transition to by mouth antibiotics would be made. Recommend at least a 2 week course of antibiotics and in addition mupirocin to be completed.sensitivities are reviewed and clindamycin would be a good by mouth choice, however this depends on the patient's financial situation, Bactrim may be more reasonable.  Annaka Cleaver L 09/06/2015, 2:44 PM

## 2015-09-06 NOTE — Care Management Note (Signed)
Case Management Note  Patient Details  Name: [name] [name] MRN: [id number] Date of Birth: Nov 03, 1985  Subjective/Objective:                  abscess  Action/Plan: Discharge planning Expected Discharge Date:   (unknown)               Expected Discharge Plan:  Homeless Shelter  In-House Referral:  Clinical Social Work  Discharge planning Services  CM Consult, [facility], [place] [facility], Medication Assistance  Post Acute Care Choice:    Choice offered to:     DME Arranged:    DME Agency:     HH Arranged:    [place] Agency:     Status of Service:  In process, will continue to follow  If discussed at Long Length of Stay Meetings, dates discussed:    Additional Comments: CM notes pt no longer resides at [place] address and is, for all practical purposes, homeless.  CM has discussed with CSW and will continue to follow as pt will need medication asst at discharge.  PLEASE CONTACT CM AT DISCHARGE TIME FOR MATCH if needed. Dellie Catholic, RN 09/06/2015, 2:01 PM

## 2015-09-07 DIAGNOSIS — B9689 Other specified bacterial agents as the cause of diseases classified elsewhere: Secondary | ICD-10-CM

## 2015-09-07 DIAGNOSIS — B9562 Methicillin resistant Staphylococcus aureus infection as the cause of diseases classified elsewhere: Secondary | ICD-10-CM

## 2015-09-07 DIAGNOSIS — L0390 Cellulitis, unspecified: Secondary | ICD-10-CM | POA: Insufficient documentation

## 2015-09-07 LAB — BASIC METABOLIC PANEL
Anion gap: 7 (ref 5–15)
BUN: 6 mg/dL (ref 6–20)
CALCIUM: 8.9 mg/dL (ref 8.9–10.3)
CO2: 28 mmol/L (ref 22–32)
Chloride: 104 mmol/L (ref 101–111)
Creatinine, Ser: 1.66 mg/dL — ABNORMAL HIGH (ref 0.61–1.24)
GFR calc Af Amer: >60 mL/min (ref >60)
GFR, EST NON AFRICAN AMERICAN: 54 mL/min — AB (ref >60)
GLUCOSE: 183 mg/dL — AB (ref 65–99)
Potassium: 3.8 mmol/L (ref 3.5–5.1)
SODIUM: 139 mmol/L (ref 135–145)

## 2015-09-07 LAB — AEROBIC CULTURE W GRAM STAIN (SUPERFICIAL SPECIMEN)

## 2015-09-07 LAB — AEROBIC CULTURE  (SUPERFICIAL SPECIMEN)

## 2015-09-07 LAB — GLUCOSE, CAPILLARY
Glucose-Capillary: 73 mg/dL (ref 65–99)
Glucose-Capillary: 190 mg/dL — ABNORMAL HIGH (ref 65–99)
Glucose-Capillary: 62 mg/dL — ABNORMAL LOW (ref 65–99)
Glucose-Capillary: 174 mg/dL — ABNORMAL HIGH (ref 65–99)
Glucose-Capillary: 135 mg/dL — ABNORMAL HIGH (ref 65–99)

## 2015-09-07 LAB — CREATININE, SERUM
CREATININE: 1.69 mg/dL — AB (ref 0.61–1.24)
GFR calc non Af Amer: 53 mL/min — ABNORMAL LOW (ref >60)

## 2015-09-07 MED ORDER — INSULIN GLARGINE 100 UNIT/ML ~~LOC~~ SOLN
35.0000 [IU] | Freq: Two times a day (BID) | SUBCUTANEOUS | Status: DC
  Administered 2015-09-07 – 2015-09-11 (×8): 35 [IU] via SUBCUTANEOUS
  Filled 2015-09-07 (×8): qty 0.35

## 2015-09-07 MED ORDER — HEPARIN SODIUM (PORCINE) 5000 UNIT/ML IJ SOLN
5000.0000 [IU] | Freq: Three times a day (TID) | INTRAMUSCULAR | Status: DC
  Administered 2015-09-07 – 2015-09-11 (×12): 5000 [IU] via SUBCUTANEOUS
  Filled 2015-09-07 (×12): qty 1

## 2015-09-07 MED ORDER — DOXYCYCLINE HYCLATE 100 MG PO TABS
100.0000 mg | ORAL_TABLET | Freq: Two times a day (BID) | ORAL | Status: DC
  Administered 2015-09-07 – 2015-09-11 (×9): 100 mg via ORAL
  Filled 2015-09-07 (×12): qty 1
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
## 2015-09-07 NOTE — Progress Notes (Signed)
Patient ID: [name] [name], male   DOB: Nov 28, 1985, 30 y.o.   MRN: [id number]         [facility]    Date of Admission:  08/30/2015   Total days of antibiotics 7        Day 1 doxycycline  He is feeling better. He is no longer having any pain in his shoulders or right forearm. He has no new areas that are bothering him. All 3 access sites have been well drained. He has grown MRSA from all sites. I recommend finishing treatment with 7 more days of oral doxycycline. I will sign off now.         Michel Bickers, MD [facility] [place] [facility] [phone] pager   [phone] cell 03/22/2015, 1:32 PM

## 2015-09-07 NOTE — Progress Notes (Signed)
PROGRESS NOTE    [name] [name]  [id number] DOB: 1985/07/22 DOA: 08/30/2015  PCP: No primary care provider on file.   Brief Narrative:  30 year old with hypertension, diabetes who is not on medication and asthma presented on 6/13 with an abscess on his left shoulder. Was admitted by general surgery with drainage of the abscess. Sugars were severely uncontrolled and internal medicine was consulted. He was eventually transferred to internal medicine. Subsequently right hand was noted to become swollen and therefore hand surgery was consulted. He was found to have an infected hematoma and taken to the OR for an I & D of the hand. Culture from shoulder growing MRSA and staph lugdenensis. Subsequently noted to have a small ulcer on right shoulder- I and D done on 6/19 at bedside.   Subjective: No new complaints. Had 3 episode of diarrhea overnight.   Assessment & Plan:   Principal Problem:   Abscess of left shoulder and right hand -Culture from left shoulder Growing staph Lugdunensis and MRSA as mentioned above-continue vancomycin and Zosyn- still has copious drainage from ulcer - wound cultures from right hand growing MRSA and small amount of negative rods  - New right shoulder abscess drained by gen surgery-   gr + cocci as well -Blood cultures negative  -Dr Megan Salon recommend discontinuation of vancomycin today and start doxycycline.  -needs wound care , might benefit from SNF.     Uncontrolled diabetes mellitus  -Unable to afford medications and unable to afford a PCP - Hemoglobin A1c 13.7 - lantus decreased to 35 units to avoid hypoglycemia, CNG this am at 70  AKI - Cr high yesterday - cr increased this am. Vancomycin discontinue.  -increase IV fluids.   Homeless - no health insurance - social work/ case management aware   DVT prophylaxis: SCDs, heparin  Code Status: Full code Family Communication:  Disposition Plan: home  Consultants:   gen surgery and hand  surgery Procedures:   I and D  Antimicrobials:  Anti-infectives    Start     Dose/Rate Route Frequency Ordered Stop   09/07/15 1200  doxycycline (VIBRA-TABS) tablet 100 mg     100 mg Oral Every 12 hours 09/07/15 1109     09/04/15 1600  vancomycin (VANCOCIN) IVPB 1000 mg/200 mL premix  Status:  Discontinued     1,000 mg 200 mL/hr over 60 Minutes Intravenous Every 8 hours 09/04/15 0913 09/07/15 1108   09/04/15 1000  vancomycin (VANCOCIN) IVPB 1000 mg/200 mL premix  Status:  Discontinued     1,000 mg 200 mL/hr over 60 Minutes Intravenous Every 8 hours 09/04/15 0910 09/04/15 0913   09/04/15 0915  vancomycin (VANCOCIN) 1,250 mg in sodium chloride 0.9 % 250 mL IVPB     1,250 mg 166.7 mL/hr over 90 Minutes Intravenous  Once 09/04/15 0913 09/04/15 1045   09/03/15 1022  polymyxin B 500,000 Units, bacitracin 50,000 Units in sodium chloride irrigation 0.9 % 500 mL irrigation  Status:  Discontinued       As needed 09/03/15 1022 09/03/15 1058   09/03/15 0800  piperacillin-tazobactam (ZOSYN) IVPB 3.375 g  Status:  Discontinued     3.375 g 12.5 mL/hr over 240 Minutes Intravenous Every 8 hours 09/02/15 2345 09/06/15 1743   09/01/15 1600  vancomycin (VANCOCIN) 1,250 mg in sodium chloride 0.9 % 250 mL IVPB  Status:  Discontinued     1,250 mg 166.7 mL/hr over 90 Minutes Intravenous Every 8 hours 09/01/15 1551 09/04/15 0910   08/31/15 0800  piperacillin-tazobactam (  ZOSYN) IVPB 3.375 g  Status:  Discontinued     3.375 g 12.5 mL/hr over 240 Minutes Intravenous Every 8 hours 08/31/15 0532 09/02/15 2345   08/31/15 0800  vancomycin (VANCOCIN) IVPB 1000 mg/200 mL premix  Status:  Discontinued     1,000 mg 200 mL/hr over 60 Minutes Intravenous Every 8 hours 08/31/15 0532 09/01/15 1551   08/31/15 0115  vancomycin (VANCOCIN) IVPB 1000 mg/200 mL premix     1,000 mg 200 mL/hr over 60 Minutes Intravenous  Once 08/31/15 0110 08/31/15 0407   08/31/15 0115  piperacillin-tazobactam (ZOSYN) IVPB 3.375 g     3.375  g 100 mL/hr over 30 Minutes Intravenous  Once 08/31/15 0110 08/31/15 0256       Objective: Filed Vitals:   09/06/15 0449 09/06/15 1453 09/06/15 2230 09/07/15 0602  BP: 163/81 149/96 156/74 160/94  Pulse: 78 81 98 82  Temp: 97.7 F (36.5 C) 97.8 F (36.6 C) 98 F (36.7 C) 98.2 F (36.8 C)  TempSrc: Oral Oral Oral Oral  Resp: 16 18 16 16   Height:      Weight:      SpO2: 100% 99% 100% 98%    Intake/Output Summary (Last 24 hours) at 09/07/15 1337 Last data filed at 09/07/15 0900  Gross per 24 hour  Intake 3651.67 ml  Output      0 ml  Net 3651.67 ml   Filed Weights   08/31/15 0129  Weight: 102.059 kg (225 lb)    Examination: General exam: Appears comfortable  HEENT: PERRLA, oral mucosa moist, no sclera icterus or thrush Respiratory system: Clear to auscultation. Respiratory effort normal. Cardiovascular system: S1 & S2 heard, RRR.  No murmurs  Gastrointestinal system: Abdomen soft, non-tender, nondistended. Normal bowel sound. No organomegaly Central nervous system: Alert and oriented. No focal neurological deficits. Extremities: No cyanosis, clubbing or edema Skin: large ulcer on post aspect L shoulder draining serosanguinous fluid- right hand bandaged.- smaller ulcer on post aspect of right shoulder with packing.   Psychiatry:  Mood & affect appropriate.     Data Reviewed: I have personally reviewed following labs and imaging studies  CBC:  Recent Labs Lab 09/02/15 0951 09/03/15 0925 09/04/15 0700 09/05/15 0429 09/06/15 0450  WBC 16.1* 15.8* 17.2* 16.1* 11.8*  NEUTROABS 12.7*  --   --   --   --   HGB 13.0 12.9* 11.1* 11.8* 12.9*  HCT 35.8* 36.1* 32.7* 34.9* 37.7*  MCV 82.3 83.0 86.5 85.5 88.1  PLT 266 289 263 296 [id number]   Basic Metabolic Panel:  Recent Labs Lab 09/02/15 0951 09/03/15 0925 09/04/15 0700 09/04/15 0755 09/05/15 0429 09/06/15 0450 09/07/15 0815 09/07/15 1144  NA 134* 139 139  --  139 134*  --  139  K 3.8 3.6 3.3*  --  3.2* 4.0  --   3.8  CL 98* 105 104  --  104 101  --  104  CO2 26 26 29   --  28 25  --  28  GLUCOSE 275* 206* 146*  --  104* 176*  --  183*  BUN 8 6 <5*  --  6 18  --  6  CREATININE 0.81 0.93 0.88 0.88 1.41* 0.97 1.69* 1.66*  CALCIUM 8.9 9.0 8.5*  --  8.7* 8.7*  --  8.9  MG 1.9  --   --   --   --   --   --   --   PHOS 3.8  --   --   --   --   --   --   --  GFR: Estimated Creatinine Clearance: 74.8 mL/min (by C-G formula based on Cr of 1.66). Liver Function Tests:  Recent Labs Lab 09/01/15 0446 09/02/15 0951  AST 16  --   ALT 15*  --   ALKPHOS 87  --   BILITOT 0.9  --   PROT 6.0*  --   ALBUMIN 2.9* 3.1*   No results for input(s): LIPASE, AMYLASE in the last 168 hours. No results for input(s): AMMONIA in the last 168 hours. Coagulation Profile: No results for input(s): INR, PROTIME in the last 168 hours. Cardiac Enzymes: No results for input(s): CKTOTAL, CKMB, CKMBINDEX, TROPONINI in the last 168 hours. BNP (last 3 results) No results for input(s): PROBNP in the last 8760 hours. HbA1C: No results for input(s): HGBA1C in the last 72 hours. CBG:  Recent Labs Lab 09/06/15 1147 09/06/15 1723 09/06/15 2229 09/07/15 0724 09/07/15 1212  GLUCAP 164* 187* 116* 73 190*   Lipid Profile: No results for input(s): CHOL, HDL, LDLCALC, TRIG, CHOLHDL, LDLDIRECT in the last 72 hours. Thyroid Function Tests: No results for input(s): TSH, T4TOTAL, FREET4, T3FREE, THYROIDAB in the last 72 hours. Anemia Panel: No results for input(s): VITAMINB12, FOLATE, FERRITIN, TIBC, IRON, RETICCTPCT in the last 72 hours. Urine analysis: No results found for: COLORURINE, APPEARANCEUR, LABSPEC, [place], GLUCOSEU, HGBUR, BILIRUBINUR, KETONESUR, PROTEINUR, UROBILINOGEN, NITRITE, LEUKOCYTESUR Sepsis Labs: @LABRCNTIP (procalcitonin:4,lacticidven:4) ) Recent Results (from the past 240 hour(s))  Culture, blood (Routine X 2) w Reflex to ID Panel     Status: None   Collection Time: 08/31/15  1:03 AM  Result Value Ref  Range Status   Specimen Description BLOOD RIGHT ANTECUBITAL  Final   Special Requests IN PEDIATRIC BOTTLE 4ML  Final   Culture   Final    NO GROWTH 5 DAYS Performed at [facility]    Report Status 09/05/2015 FINAL  Final  Culture, blood (Routine X 2) w Reflex to ID Panel     Status: None   Collection Time: 08/31/15  1:11 AM  Result Value Ref Range Status   Specimen Description BLOOD LEFT ANTECUBITAL  Final   Special Requests BOTTLES DRAWN AEROBIC AND ANAEROBIC 5ML  Final   Culture   Final    NO GROWTH 5 DAYS Performed at [facility]    Report Status 09/05/2015 FINAL  Final  Surgical pcr screen     Status: None   Collection Time: 08/31/15 11:00 AM  Result Value Ref Range Status   MRSA, PCR NEGATIVE NEGATIVE Final   Staphylococcus aureus NEGATIVE NEGATIVE Final    Comment:        The Xpert SA Assay (FDA approved for NASAL specimens in patients over 24 years of age), is one component of a comprehensive surveillance program.  Test performance has been validated by [facility] for patients greater than or equal to 71 year old. It is not intended to diagnose infection nor to guide or monitor treatment.   Aerobic/Anaerobic Culture (surgical/deep wound)     Status: None   Collection Time: 08/31/15 12:40 PM  Result Value Ref Range Status   Specimen Description ABSCESS LEFT SHOULDER IRRIGATION & DEBRIDEMENT  Final   Special Requests NONE  Final   Gram Stain   Final    ABUNDANT WBC PRESENT, PREDOMINANTLY PMN MODERATE GRAM POSITIVE COCCI IN CLUSTERS IN PAIRS    Culture   Final    MODERATE METHICILLIN RESISTANT STAPHYLOCOCCUS AUREUS MODERATE STAPHYLOCOCCUS LUGDUNENSIS NO ANAEROBES ISOLATED Performed at [facility]    Report Status 09/05/2015 FINAL  Final   Organism ID, Bacteria METHICILLIN RESISTANT STAPHYLOCOCCUS AUREUS  Final   Organism ID, Bacteria STAPHYLOCOCCUS LUGDUNENSIS  Final      Susceptibility   Staphylococcus lugdunensis - MIC*     CIPROFLOXACIN <=0.5 SENSITIVE Sensitive     ERYTHROMYCIN <=0.25 SENSITIVE Sensitive     GENTAMICIN <=0.5 SENSITIVE Sensitive     OXACILLIN 2 SENSITIVE Sensitive     TETRACYCLINE <=1 SENSITIVE Sensitive     VANCOMYCIN <=0.5 SENSITIVE Sensitive     TRIMETH/SULFA <=10 SENSITIVE Sensitive     CLINDAMYCIN <=0.25 SENSITIVE Sensitive     RIFAMPIN <=0.5 SENSITIVE Sensitive     Inducible Clindamycin NEGATIVE Sensitive     * MODERATE STAPHYLOCOCCUS LUGDUNENSIS   Methicillin resistant staphylococcus aureus - MIC*    CIPROFLOXACIN <=0.5 SENSITIVE Sensitive     ERYTHROMYCIN >=8 RESISTANT Resistant     GENTAMICIN <=0.5 SENSITIVE Sensitive     OXACILLIN >=4 RESISTANT Resistant     TETRACYCLINE <=1 SENSITIVE Sensitive     VANCOMYCIN <=0.5 SENSITIVE Sensitive     TRIMETH/SULFA <=10 SENSITIVE Sensitive     CLINDAMYCIN <=0.25 SENSITIVE Sensitive     RIFAMPIN <=0.5 SENSITIVE Sensitive     Inducible Clindamycin NEGATIVE Sensitive     * MODERATE METHICILLIN RESISTANT STAPHYLOCOCCUS AUREUS  Aerobic/Anaerobic Culture (surgical/deep wound)     Status: None (Preliminary result)   Collection Time: 09/03/15 10:40 AM  Result Value Ref Range Status   Specimen Description ABSCESS RIGHT ARM  Final   Special Requests NONE  Final   Gram Stain   Final    MODERATE WBC PRESENT, PREDOMINANTLY PMN RARE SQUAMOUS EPITHELIAL CELLS PRESENT FEW GRAM POSITIVE COCCI IN CLUSTERS    Culture   Final    MODERATE METHICILLIN RESISTANT STAPHYLOCOCCUS AUREUS NO ANAEROBES ISOLATED; CULTURE IN PROGRESS FOR 5 DAYS    Report Status PENDING  Incomplete   Organism ID, Bacteria METHICILLIN RESISTANT STAPHYLOCOCCUS AUREUS  Final      Susceptibility   Methicillin resistant staphylococcus aureus - MIC*    CIPROFLOXACIN <=0.5 SENSITIVE Sensitive     ERYTHROMYCIN >=8 RESISTANT Resistant     GENTAMICIN <=0.5 SENSITIVE Sensitive     OXACILLIN >=4 RESISTANT Resistant     TETRACYCLINE <=1 SENSITIVE Sensitive     VANCOMYCIN 1 SENSITIVE  Sensitive     TRIMETH/SULFA <=10 SENSITIVE Sensitive     CLINDAMYCIN <=0.25 SENSITIVE Sensitive     RIFAMPIN <=0.5 SENSITIVE Sensitive     Inducible Clindamycin NEGATIVE Sensitive     * MODERATE METHICILLIN RESISTANT STAPHYLOCOCCUS AUREUS  Aerobic Culture (superficial specimen)     Status: None (Preliminary result)   Collection Time: 09/05/15 12:44 PM  Result Value Ref Range Status   Specimen Description WOUND RIGHT SHOULDER  Final   Special Requests NONE  Final   Gram Stain   Final    WBC PRESENT,BOTH PMN AND MONONUCLEAR ABUNDANT GRAM POSITIVE COCCI CRITICAL RESULT CALLED TO, READ BACK BY AND VERIFIED WITH: R.RIMANDO,RN 09/05/15 @2040  BY V.WILKINS CONFIRMED BY W.JOHNSON Performed at [facility]    Culture   Final    MODERATE METHICILLIN RESISTANT STAPHYLOCOCCUS AUREUS   Report Status PENDING  Incomplete   Organism ID, Bacteria METHICILLIN RESISTANT STAPHYLOCOCCUS AUREUS  Final      Susceptibility   Methicillin resistant staphylococcus aureus - MIC*    CIPROFLOXACIN <=0.5 SENSITIVE Sensitive     ERYTHROMYCIN >=8 RESISTANT Resistant     GENTAMICIN <=0.5 SENSITIVE Sensitive     OXACILLIN >=4 RESISTANT Resistant     TETRACYCLINE <=  1 SENSITIVE Sensitive     VANCOMYCIN 1 SENSITIVE Sensitive     TRIMETH/SULFA <=10 SENSITIVE Sensitive     CLINDAMYCIN <=0.25 SENSITIVE Sensitive     RIFAMPIN <=0.5 SENSITIVE Sensitive     Inducible Clindamycin NEGATIVE Sensitive     * MODERATE METHICILLIN RESISTANT STAPHYLOCOCCUS AUREUS         Radiology Studies: No results found.    Scheduled Meds: . doxycycline  100 mg Oral Q12H  . feeding supplement (GLUCERNA SHAKE)  237 mL Oral BID BM  . heparin subcutaneous  5,000 Units Subcutaneous Q8H  . insulin aspart  0-20 Units Subcutaneous TID WC  . insulin aspart  0-5 Units Subcutaneous QHS  . insulin aspart  3 Units Subcutaneous TID WC  . insulin glargine  38 Units Subcutaneous BID  . mupirocin ointment   Topical BID   Continuous  Infusions: . sodium chloride 100 mL/hr at 09/07/15 0255     LOS: 7 days    Time spent in minutes: 35    Regalado, Belkys A, MD [facility] Pager: [email or web address] Password [email or web address] 09/07/2015, 1:37 PM

## 2015-09-08 DIAGNOSIS — L0291 Cutaneous abscess, unspecified: Secondary | ICD-10-CM

## 2015-09-08 DIAGNOSIS — L0390 Cellulitis, unspecified: Secondary | ICD-10-CM

## 2015-09-08 LAB — GLUCOSE, CAPILLARY
Glucose-Capillary: 104 mg/dL — ABNORMAL HIGH (ref 65–99)
Glucose-Capillary: 162 mg/dL — ABNORMAL HIGH (ref 65–99)
Glucose-Capillary: 200 mg/dL — ABNORMAL HIGH (ref 65–99)
Glucose-Capillary: 194 mg/dL — ABNORMAL HIGH (ref 65–99)

## 2015-09-08 LAB — CBC
HCT: 32.6 % — ABNORMAL LOW (ref 39.0–52.0)
Hemoglobin: 10.9 g/dL — ABNORMAL LOW (ref 13.0–17.0)
MCH: 28.9 pg (ref 26.0–34.0)
MCHC: 33.4 g/dL (ref 30.0–36.0)
MCV: 86.5 fL (ref 78.0–100.0)
PLATELETS: 328 10*3/uL (ref 150–400)
RBC: 3.77 MIL/uL — AB (ref 4.22–5.81)
RDW: 12.5 % (ref 11.5–15.5)
WBC: 13.6 10*3/uL — AB (ref 4.0–10.5)

## 2015-09-08 LAB — BASIC METABOLIC PANEL
Anion gap: 7 (ref 5–15)
BUN: 8 mg/dL (ref 6–20)
CALCIUM: 8.5 mg/dL — AB (ref 8.9–10.3)
CO2: 28 mmol/L (ref 22–32)
Chloride: 104 mmol/L (ref 101–111)
Creatinine, Ser: 1.62 mg/dL — ABNORMAL HIGH (ref 0.61–1.24)
GFR calc Af Amer: >60 mL/min (ref >60)
GFR, EST NON AFRICAN AMERICAN: 56 mL/min — AB (ref >60)
GLUCOSE: 78 mg/dL (ref 65–99)
Potassium: 3.1 mmol/L — ABNORMAL LOW (ref 3.5–5.1)
Sodium: 139 mmol/L (ref 135–145)

## 2015-09-08 LAB — AEROBIC/ANAEROBIC CULTURE W GRAM STAIN (SURGICAL/DEEP WOUND)

## 2015-09-08 LAB — AEROBIC/ANAEROBIC CULTURE (SURGICAL/DEEP WOUND)

## 2015-09-08 MED ORDER — POTASSIUM CHLORIDE CRYS ER 20 MEQ PO TBCR
20.0000 meq | EXTENDED_RELEASE_TABLET | Freq: Once | ORAL | Status: AC
  Administered 2015-09-08: 20 meq via ORAL
  Filled 2015-09-08: qty 1

## 2015-09-08 NOTE — Progress Notes (Signed)
Subjective: 5 Days Post-Op Procedure(s) (LRB): INCISION AND DRAINAGE ABSCESS (Right) Patient awake, eating breakfast. No complaints to right wrist forearm region. Denies nausea or vomiting. History of diarrhea, improved now.  Objective: Vital signs in last 24 hours: Temp:  [97.5 F (36.4 C)-98.5 F (36.9 C)] 97.9 F (36.6 C) (06/22 0753) Pulse Rate:  [74-92] 74 (06/22 0753) Resp:  [14-16] 16 (06/22 0753) BP: (154-182)/(71-96) 154/71 mmHg (06/22 0753) SpO2:  [99 %-100 %] 99 % (06/22 0753)  Intake/Output from previous day: 06/21 0701 - 06/22 0700 In: 3651.7 [P.O.:1080; I.V.:2571.7] Out: 2950 [Urine:2950] Intake/Output this shift:     Recent Labs  09/06/15 0450 09/08/15 0402  HGB 12.9* 10.9*    Recent Labs  09/06/15 0450 09/08/15 0402  WBC 11.8* 13.6*  RBC 4.28 3.77*  HCT 37.7* 32.6*  PLT 224 328    Recent Labs  09/07/15 1144 09/08/15 0402  NA 139 139  K 3.8 3.1*  CL 104 104  CO2 28 28  BUN 6 8  CREATININE 1.66* 1.62*  GLUCOSE 183* 78  CALCIUM 8.9 8.5*   No results for input(s): LABPT, INR in the last 72 hours.  Dressings removed about right forearm, incision is clean and dry, sutures are intact, no signs of infection or ascending cellulitis, digital rom intact, neurovascularly intact Dressing changed.  Assessment/Plan: 5 Days Post-Op Procedure(s) (LRB): INCISION AND DRAINAGE ABSCESS (Right) Patient stable in regards to right forearm, wound is intact. He will need sutures removed at 2 weeks post op, until then keep clean and dry, encourage rom of digits, wrist, elbow and shoulder. We will continue to follow. All questions encouraged and answered.  Sally-Anne Wamble L 09/08/2015, 8:13 AM

## 2015-09-08 NOTE — Progress Notes (Signed)
PROGRESS NOTE    Marcangelo Hemming  [id number] DOB: 1985/11/18 DOA: 08/30/2015  PCP: No primary care provider on file.   Brief Narrative:  30 year old with hypertension, diabetes who is not on medication and asthma presented on 6/13 with an abscess on his left shoulder. Was admitted by general surgery with drainage of the abscess. Sugars were severely uncontrolled and internal medicine was consulted. He was eventually transferred to internal medicine. Subsequently right hand was noted to become swollen and therefore hand surgery was consulted. He was found to have an infected hematoma and taken to the OR for an I & D of the hand. Culture from shoulder growing MRSA and staph lugdenensis. Subsequently noted to have a small ulcer on right shoulder- I and D done on 6/19 at bedside.   Subjective: Now report chronic diarrhea , had 2 bm yesterday , none today   Assessment & Plan:   Principal Problem:   Abscess of left shoulder and right hand -Culture from left shoulder Growing staph Lugdunensis and MRSA as mentioned above-continue vancomycin and Zosyn- still has copious drainage from ulcer - wound cultures from right hand growing MRSA and small amount of negative rods  - New right shoulder abscess drained by gen surgery-   gr + cocci as well -Blood cultures negative  -Dr Megan Salon recommend discontinuation of vancomycin today and start doxycycline.  -needs wound care , might benefit from SNF.  Continue with doxycycline     Uncontrolled diabetes mellitus  -Unable to afford medications and unable to afford a PCP - Hemoglobin A1c 13.7 - lantus decreased to 35 units to avoid hypoglycemia, CNG this am at 70  AKI - Cr high yesterday - cr increased this am. Vancomycin discontinue.  -continue with  IV fluids.   Homeless - no health insurance - social work/ case management aware   DVT prophylaxis: SCDs, heparin  Code Status: Full code Family Communication:  Disposition Plan: home    Consultants:   gen surgery and hand surgery Procedures:   I and D  Antimicrobials:  Anti-infectives    Start     Dose/Rate Route Frequency Ordered Stop   09/07/15 1200  doxycycline (VIBRA-TABS) tablet 100 mg     100 mg Oral Every 12 hours 09/07/15 1109     09/04/15 1600  vancomycin (VANCOCIN) IVPB 1000 mg/200 mL premix  Status:  Discontinued     1,000 mg 200 mL/hr over 60 Minutes Intravenous Every 8 hours 09/04/15 0913 09/07/15 1108   09/04/15 1000  vancomycin (VANCOCIN) IVPB 1000 mg/200 mL premix  Status:  Discontinued     1,000 mg 200 mL/hr over 60 Minutes Intravenous Every 8 hours 09/04/15 0910 09/04/15 0913   09/04/15 0915  vancomycin (VANCOCIN) 1,250 mg in sodium chloride 0.9 % 250 mL IVPB     1,250 mg 166.7 mL/hr over 90 Minutes Intravenous  Once 09/04/15 0913 09/04/15 1045   09/03/15 1022  polymyxin B 500,000 Units, bacitracin 50,000 Units in sodium chloride irrigation 0.9 % 500 mL irrigation  Status:  Discontinued       As needed 09/03/15 1022 09/03/15 1058   09/03/15 0800  piperacillin-tazobactam (ZOSYN) IVPB 3.375 g  Status:  Discontinued     3.375 g 12.5 mL/hr over 240 Minutes Intravenous Every 8 hours 09/02/15 2345 09/06/15 1743   09/01/15 1600  vancomycin (VANCOCIN) 1,250 mg in sodium chloride 0.9 % 250 mL IVPB  Status:  Discontinued     1,250 mg 166.7 mL/hr over 90 Minutes Intravenous Every 8 hours  09/01/15 1551 09/04/15 0910   08/31/15 0800  piperacillin-tazobactam (ZOSYN) IVPB 3.375 g  Status:  Discontinued     3.375 g 12.5 mL/hr over 240 Minutes Intravenous Every 8 hours 08/31/15 0532 09/02/15 2345   08/31/15 0800  vancomycin (VANCOCIN) IVPB 1000 mg/200 mL premix  Status:  Discontinued     1,000 mg 200 mL/hr over 60 Minutes Intravenous Every 8 hours 08/31/15 0532 09/01/15 1551   08/31/15 0115  vancomycin (VANCOCIN) IVPB 1000 mg/200 mL premix     1,000 mg 200 mL/hr over 60 Minutes Intravenous  Once 08/31/15 0110 08/31/15 0407   08/31/15 0115   piperacillin-tazobactam (ZOSYN) IVPB 3.375 g     3.375 g 100 mL/hr over 30 Minutes Intravenous  Once 08/31/15 0110 08/31/15 0256       Objective: Filed Vitals:   09/07/15 2110 09/08/15 0538 09/08/15 0753 09/08/15 1338  BP: 164/94 182/95 154/71 165/98  Pulse: 92 77 74 86  Temp: 98.5 F (36.9 C) 97.5 F (36.4 C) 97.9 F (36.6 C) 98.6 F (37 C)  TempSrc: Oral Oral Oral Oral  Resp: 16 14 16 16   Height:      Weight:      SpO2: 100% 99% 99% 100%    Intake/Output Summary (Last 24 hours) at 09/08/15 1753 Last data filed at 09/08/15 1321  Gross per 24 hour  Intake 2663.33 ml  Output   2950 ml  Net -286.67 ml   Filed Weights   08/31/15 0129  Weight: 102.059 kg (225 lb)    Examination: General exam: Appears comfortable  HEENT: PERRLA, oral mucosa moist, no sclera icterus or thrush Respiratory system: Clear to auscultation. Respiratory effort normal. Cardiovascular system: S1 & S2 heard, RRR.  No murmurs  Gastrointestinal system: Abdomen soft, non-tender, nondistended. Normal bowel sound. No organomegaly Central nervous system: Alert and oriented. No focal neurological deficits. Extremities: No cyanosis, clubbing or edema Skin: large ulcer on post aspect L shoulder draining serosanguinous fluid- right hand bandaged.- smaller ulcer on post aspect of right shoulder with packing.   Psychiatry:  Mood & affect appropriate.     Data Reviewed: I have personally reviewed following labs and imaging studies  CBC:  Recent Labs Lab 09/02/15 0951 09/03/15 0925 09/04/15 0700 09/05/15 0429 09/06/15 0450 09/08/15 0402  WBC 16.1* 15.8* 17.2* 16.1* 11.8* 13.6*  NEUTROABS 12.7*  --   --   --   --   --   HGB 13.0 12.9* 11.1* 11.8* 12.9* 10.9*  HCT 35.8* 36.1* 32.7* 34.9* 37.7* 32.6*  MCV 82.3 83.0 86.5 85.5 88.1 86.5  PLT 266 289 263 296 224 [id number]   Basic Metabolic Panel:  Recent Labs Lab 09/02/15 0951  09/04/15 0700  09/05/15 0429 09/06/15 0450 09/07/15 0815 09/07/15 1144  09/08/15 0402  NA 134*  < > 139  --  139 134*  --  139 139  K 3.8  < > 3.3*  --  3.2* 4.0  --  3.8 3.1*  CL 98*  < > 104  --  104 101  --  104 104  CO2 26  < > 29  --  28 25  --  28 28  GLUCOSE 275*  < > 146*  --  104* 176*  --  183* 78  BUN 8  < > <5*  --  6 18  --  6 8  CREATININE 0.81  < > 0.88  < > 1.41* 0.97 1.69* 1.66* 1.62*  CALCIUM 8.9  < > 8.5*  --  8.7* 8.7*  --  8.9 8.5*  MG 1.9  --   --   --   --   --   --   --   --   PHOS 3.8  --   --   --   --   --   --   --   --   < > = values in this interval not displayed. GFR: Estimated Creatinine Clearance: 76.6 mL/min (by C-G formula based on Cr of 1.62). Liver Function Tests:  Recent Labs Lab 09/02/15 0951  ALBUMIN 3.1*   No results for input(s): LIPASE, AMYLASE in the last 168 hours. No results for input(s): AMMONIA in the last 168 hours. Coagulation Profile: No results for input(s): INR, PROTIME in the last 168 hours. Cardiac Enzymes: No results for input(s): CKTOTAL, CKMB, CKMBINDEX, TROPONINI in the last 168 hours. BNP (last 3 results) No results for input(s): PROBNP in the last 8760 hours. HbA1C: No results for input(s): HGBA1C in the last 72 hours. CBG:  Recent Labs Lab 09/07/15 1834 09/07/15 2108 09/08/15 0737 09/08/15 1159 09/08/15 1713  GLUCAP 174* 135* 104* 162* 200*   Lipid Profile: No results for input(s): CHOL, HDL, LDLCALC, TRIG, CHOLHDL, LDLDIRECT in the last 72 hours. Thyroid Function Tests: No results for input(s): TSH, T4TOTAL, FREET4, T3FREE, THYROIDAB in the last 72 hours. Anemia Panel: No results for input(s): VITAMINB12, FOLATE, FERRITIN, TIBC, IRON, RETICCTPCT in the last 72 hours. Urine analysis: No results found for: COLORURINE, APPEARANCEUR, LABSPEC, PHURINE, GLUCOSEU, HGBUR, BILIRUBINUR, KETONESUR, PROTEINUR, UROBILINOGEN, NITRITE, LEUKOCYTESUR Sepsis Labs: @LABRCNTIP (procalcitonin:4,lacticidven:4) ) Recent Results (from the past 240 hour(s))  Culture, blood (Routine X 2) w Reflex  to ID Panel     Status: None   Collection Time: 08/31/15  1:03 AM  Result Value Ref Range Status   Specimen Description BLOOD RIGHT ANTECUBITAL  Final   Special Requests IN PEDIATRIC BOTTLE 4ML  Final   Culture   Final    NO GROWTH 5 DAYS Performed at [facility]    Report Status 09/05/2015 FINAL  Final  Culture, blood (Routine X 2) w Reflex to ID Panel     Status: None   Collection Time: 08/31/15  1:11 AM  Result Value Ref Range Status   Specimen Description BLOOD LEFT ANTECUBITAL  Final   Special Requests BOTTLES DRAWN AEROBIC AND ANAEROBIC 5ML  Final   Culture   Final    NO GROWTH 5 DAYS Performed at [facility]    Report Status 09/05/2015 FINAL  Final  Surgical pcr screen     Status: None   Collection Time: 08/31/15 11:00 AM  Result Value Ref Range Status   MRSA, PCR NEGATIVE NEGATIVE Final   Staphylococcus aureus NEGATIVE NEGATIVE Final    Comment:        The Xpert SA Assay (FDA approved for NASAL specimens in patients over 66 years of age), is one component of a comprehensive surveillance program.  Test performance has been validated by [facility] for patients greater than or equal to 45 year old. It is not intended to diagnose infection nor to guide or monitor treatment.   Aerobic/Anaerobic Culture (surgical/deep wound)     Status: None   Collection Time: 08/31/15 12:40 PM  Result Value Ref Range Status   Specimen Description ABSCESS LEFT SHOULDER IRRIGATION & DEBRIDEMENT  Final   Special Requests NONE  Final   Gram Stain   Final    ABUNDANT WBC PRESENT, PREDOMINANTLY PMN MODERATE GRAM POSITIVE  COCCI IN CLUSTERS IN PAIRS    Culture   Final    MODERATE METHICILLIN RESISTANT STAPHYLOCOCCUS AUREUS MODERATE STAPHYLOCOCCUS LUGDUNENSIS NO ANAEROBES ISOLATED Performed at [facility]    Report Status 09/05/2015 FINAL  Final   Organism ID, Bacteria METHICILLIN RESISTANT STAPHYLOCOCCUS AUREUS  Final   Organism ID, Bacteria STAPHYLOCOCCUS  LUGDUNENSIS  Final      Susceptibility   Staphylococcus lugdunensis - MIC*    CIPROFLOXACIN <=0.5 SENSITIVE Sensitive     ERYTHROMYCIN <=0.25 SENSITIVE Sensitive     GENTAMICIN <=0.5 SENSITIVE Sensitive     OXACILLIN 2 SENSITIVE Sensitive     TETRACYCLINE <=1 SENSITIVE Sensitive     VANCOMYCIN <=0.5 SENSITIVE Sensitive     TRIMETH/SULFA <=10 SENSITIVE Sensitive     CLINDAMYCIN <=0.25 SENSITIVE Sensitive     RIFAMPIN <=0.5 SENSITIVE Sensitive     Inducible Clindamycin NEGATIVE Sensitive     * MODERATE STAPHYLOCOCCUS LUGDUNENSIS   Methicillin resistant staphylococcus aureus - MIC*    CIPROFLOXACIN <=0.5 SENSITIVE Sensitive     ERYTHROMYCIN >=8 RESISTANT Resistant     GENTAMICIN <=0.5 SENSITIVE Sensitive     OXACILLIN >=4 RESISTANT Resistant     TETRACYCLINE <=1 SENSITIVE Sensitive     VANCOMYCIN <=0.5 SENSITIVE Sensitive     TRIMETH/SULFA <=10 SENSITIVE Sensitive     CLINDAMYCIN <=0.25 SENSITIVE Sensitive     RIFAMPIN <=0.5 SENSITIVE Sensitive     Inducible Clindamycin NEGATIVE Sensitive     * MODERATE METHICILLIN RESISTANT STAPHYLOCOCCUS AUREUS  Aerobic/Anaerobic Culture (surgical/deep wound)     Status: None   Collection Time: 09/03/15 10:40 AM  Result Value Ref Range Status   Specimen Description ABSCESS RIGHT ARM  Final   Special Requests NONE  Final   Gram Stain   Final    MODERATE WBC PRESENT, PREDOMINANTLY PMN RARE SQUAMOUS EPITHELIAL CELLS PRESENT FEW GRAM POSITIVE COCCI IN CLUSTERS    Culture   Final    MODERATE METHICILLIN RESISTANT STAPHYLOCOCCUS AUREUS NO ANAEROBES ISOLATED Performed at [facility]    Report Status 09/08/2015 FINAL  Final   Organism ID, Bacteria METHICILLIN RESISTANT STAPHYLOCOCCUS AUREUS  Final      Susceptibility   Methicillin resistant staphylococcus aureus - MIC*    CIPROFLOXACIN <=0.5 SENSITIVE Sensitive     ERYTHROMYCIN >=8 RESISTANT Resistant     GENTAMICIN <=0.5 SENSITIVE Sensitive     OXACILLIN >=4 RESISTANT Resistant      TETRACYCLINE <=1 SENSITIVE Sensitive     VANCOMYCIN 1 SENSITIVE Sensitive     TRIMETH/SULFA <=10 SENSITIVE Sensitive     CLINDAMYCIN <=0.25 SENSITIVE Sensitive     RIFAMPIN <=0.5 SENSITIVE Sensitive     Inducible Clindamycin NEGATIVE Sensitive     * MODERATE METHICILLIN RESISTANT STAPHYLOCOCCUS AUREUS  Aerobic Culture (superficial specimen)     Status: None   Collection Time: 09/05/15 12:44 PM  Result Value Ref Range Status   Specimen Description WOUND RIGHT SHOULDER  Final   Special Requests NONE  Final   Gram Stain   Final    WBC PRESENT,BOTH PMN AND MONONUCLEAR ABUNDANT GRAM POSITIVE COCCI CRITICAL RESULT CALLED TO, READ BACK BY AND VERIFIED WITH: R.RIMANDO,RN 09/05/15 @2040  BY V.WILKINS CONFIRMED BY W.JOHNSON Performed at [facility]    Culture   Final    MODERATE METHICILLIN RESISTANT STAPHYLOCOCCUS AUREUS   Report Status 09/07/2015 FINAL  Final   Organism ID, Bacteria METHICILLIN RESISTANT STAPHYLOCOCCUS AUREUS  Final      Susceptibility   Methicillin resistant staphylococcus aureus - MIC*  CIPROFLOXACIN <=0.5 SENSITIVE Sensitive     ERYTHROMYCIN >=8 RESISTANT Resistant     GENTAMICIN <=0.5 SENSITIVE Sensitive     OXACILLIN >=4 RESISTANT Resistant     TETRACYCLINE <=1 SENSITIVE Sensitive     VANCOMYCIN 1 SENSITIVE Sensitive     TRIMETH/SULFA <=10 SENSITIVE Sensitive     CLINDAMYCIN <=0.25 SENSITIVE Sensitive     RIFAMPIN <=0.5 SENSITIVE Sensitive     Inducible Clindamycin NEGATIVE Sensitive     * MODERATE METHICILLIN RESISTANT STAPHYLOCOCCUS AUREUS         Radiology Studies: No results found.    Scheduled Meds: . doxycycline  100 mg Oral Q12H  . feeding supplement (GLUCERNA SHAKE)  237 mL Oral BID BM  . heparin subcutaneous  5,000 Units Subcutaneous Q8H  . insulin aspart  0-20 Units Subcutaneous TID WC  . insulin aspart  0-5 Units Subcutaneous QHS  . insulin aspart  3 Units Subcutaneous TID WC  . insulin glargine  35 Units Subcutaneous BID  .  mupirocin ointment   Topical BID   Continuous Infusions: . sodium chloride 100 mL/hr at 09/08/15 1142     LOS: 8 days    Time spent in minutes: 35    Jaeson Molstad A, MD [facility] Pager: [email or web address] Password [facility] 09/08/2015, 5:53 PM

## 2015-09-08 NOTE — Progress Notes (Signed)
Discharge planning, spoke with patient at bedside. He understands he needs to go to a shelter at d/c, he is waiting to hear from [place] on which one. Currently patient requires wrist dressing changes daily and dressing changes to shoulder wound BID. Patient agreeable to [facility] for wound care, contacted them for referral. [place] working with patient to find a shelter that will allow [name] [name] to see patient at their facility. Contacted [facility] and they are ok with shoulder wound dressing change to be daily once patient is d/ced from the hospital. Notified attending of above, waiting on f/u from [place].

## 2015-09-08 NOTE — Progress Notes (Signed)
Inpatient Diabetes Program Recommendations  AACE/ADA: New Consensus Statement on Inpatient Glycemic Control (2015)  Target Ranges:  Prepandial:   less than 140 mg/dL      Peak postprandial:   less than 180 mg/dL (1-2 hours)      Critically ill patients:  140 - 180 mg/dL    Results for ADRICK, SCHNEIDERS (MRN [id number]) as of 09/08/2015 15:54  Ref. Range 09/07/2015 18:34 09/07/2015 21:08 09/08/2015 07:37 09/08/2015 11:59  Glucose-Capillary Latest Ref Range: 65-99 mg/dL 174 (H) 135 (H) 104 (H) 162 (H)   Review of Glycemic Control  Blood sugars trending well. Spoke with Education officer, museum and Tourist information centre manager regarding d/c plans and MATCH program. Will need 70/30 insulin for discharge d/t MATCH program is limited to $500 for all meds.  Inpatient Diabetes Program Recommendations:    70/30 25 units bid.  Novolin R insulin for s/s at discharge.  To text page MD for recs.  Continue to follow. Thank you. Lorenda Peck, RD, LDN, CDE Inpatient Diabetes Coordinator [phone]

## 2015-09-08 NOTE — Progress Notes (Signed)
CSW assisting with d/c planning. Director, [name] [name], [facility] shelter contacted. No bed availability at this time. Mr. [name] [name] reports that even if their is availability he may not be able to accommodate this pt with multiple dressing changes daily / BID. CSW has left messages at [place] and [place]. Awaiting return calls.   Werner Lean LCSW [phone]

## 2015-09-08 NOTE — Progress Notes (Signed)
Spoke with patient and spouse at bedside with CSW present. Unable to find a shelter available for him. Per spouse, court has ordered that they could not live together for a year. Home with spouse not an option. Per spouse, his mother has offered to let him stay with her in [place]. Patient resistant to this plan but likely the only option if no shelter beds open up. Spouse and patient to discuss and try to make arrangements. Attending aware.

## 2015-09-08 NOTE — Progress Notes (Signed)
CSW / RNCM met with pt / spouse at bedside. No shelter availability at [facility]. No return calls from local shelters. Spouse reports that pt's mother is able to take pt in for 2 weeks. Pt is aware of this, reporting that he speaks with his mother on a daily basis. Pt is not pleased with this plan because she lives in [place]. " I don't know where I'll keep my 2 vehicles ( that need work ) while [employer] gone ". Spouse has encouraged pt to accept his mother's offer. Pt is considering this plan. CSW will continue to attempt to find shelter placement for pt. CSW will meet with pt again in am to continue assisting with d/c planning.  Werner Lean LCSW [phone]

## 2015-09-09 ENCOUNTER — Encounter (HOSPITAL_COMMUNITY): Payer: Self-pay | Admitting: Orthopedic Surgery

## 2015-09-09 DIAGNOSIS — N179 Acute kidney failure, unspecified: Secondary | ICD-10-CM

## 2015-09-09 LAB — GLUCOSE, CAPILLARY
Glucose-Capillary: 99 mg/dL (ref 65–99)
Glucose-Capillary: 191 mg/dL — ABNORMAL HIGH (ref 65–99)
Glucose-Capillary: 75 mg/dL (ref 65–99)
Glucose-Capillary: 155 mg/dL — ABNORMAL HIGH (ref 65–99)

## 2015-09-09 LAB — BASIC METABOLIC PANEL
ANION GAP: 7 (ref 5–15)
BUN: 11 mg/dL (ref 6–20)
CALCIUM: 9 mg/dL (ref 8.9–10.3)
CO2: 28 mmol/L (ref 22–32)
CREATININE: 1.7 mg/dL — AB (ref 0.61–1.24)
Chloride: 105 mmol/L (ref 101–111)
GFR, EST NON AFRICAN AMERICAN: 53 mL/min — AB (ref >60)
Glucose, Bld: 127 mg/dL — ABNORMAL HIGH (ref 65–99)
Potassium: 3.6 mmol/L (ref 3.5–5.1)
SODIUM: 140 mmol/L (ref 135–145)

## 2015-09-09 LAB — CBC
HCT: 32.4 % — ABNORMAL LOW (ref 39.0–52.0)
HEMOGLOBIN: 11 g/dL — AB (ref 13.0–17.0)
MCH: 29.2 pg (ref 26.0–34.0)
MCHC: 34 g/dL (ref 30.0–36.0)
MCV: 85.9 fL (ref 78.0–100.0)
PLATELETS: 325 10*3/uL (ref 150–400)
RBC: 3.77 MIL/uL — AB (ref 4.22–5.81)
RDW: 12.5 % (ref 11.5–15.5)
WBC: 12.1 10*3/uL — AB (ref 4.0–10.5)

## 2015-09-09 MED ORDER — JUVEN PO PACK
1.0000 | PACK | Freq: Two times a day (BID) | ORAL | Status: DC
  Administered 2015-09-09 – 2015-09-10 (×3): 1 via ORAL
  Filled 2015-09-09 (×7): qty 1

## 2015-09-09 MED ORDER — AMLODIPINE BESYLATE 5 MG PO TABS
5.0000 mg | ORAL_TABLET | Freq: Every day | ORAL | Status: DC
  Administered 2015-09-09 – 2015-09-10 (×2): 5 mg via ORAL
  Filled 2015-09-09 (×2): qty 1

## 2015-09-09 NOTE — Progress Notes (Signed)
CSW met with pt to assist with d/c planning. Pt has confirmed that he will go to [place] and stay with his mother at d/c. Pt reports that he will sell one of his vehicles to pay for a bus ticket to [place] has not been able to secure a shelter bed for pt. Shelter / [place] has been provided to pt.   Werner Lean LCSW [phone]

## 2015-09-09 NOTE — Progress Notes (Signed)
Initial Nutrition Assessment  DOCUMENTATION CODES:   Not applicable  INTERVENTION:  -Glucerna Shake po TID, each supplement provides 220 kcal and 10 grams of protein -Juven 1 Packet BID, each supplement provides 14 grams of protein and 75 calories  NUTRITION DIAGNOSIS:   Increased nutrient needs related to wound healing as evidenced by estimated needs.  GOAL:   Patient will meet greater than or equal to 90% of their needs  MONITOR:   PO intake, I & O's, Labs, Weight trends  REASON FOR ASSESSMENT:   Malnutrition Screening Tool    ASSESSMENT:   30 year old with hypertension, diabetes who is not on medication and asthma presented on 6/13 with an abscess on his left shoulder. Was admitted by general surgery with drainage of the abscess. Sugars were severely uncontrolled and internal medicine was consulted. He was eventually transferred to internal medicine. Subsequently right hand was noted to become swollen and therefore hand surgery was consulted. He was found to have an infected hematoma and taken to the OR for an I & D of the hand  Spoke with [name] [name] at bedside last Friday for diet education. He has now been identified on the malnutrition screening tool. He endorses good appetite now.   It dipped last week around the time that his shoulder abscess became infected where he wasn't feeling well. He has not had a breakfast tray yet today. -Documented PO intake 75-100% thus far.  He endorses weight loss but I can find no evidence of it - no new wts in chart since admission.  Per NFPE he still looks fine. No muscle wasting or fat depletion noted. Will provide Juven and Glucerna to help facilitate healing of wound and to help with infection. Unfortunately uncontrolled diabetes, A1C of 13.7%  likely cause behind many of his problems.  Labs and medications reviewed:  Diet Order:  Diet Carb Modified Fluid consistency:: Thin; Room service appropriate?: Yes  Skin:  Wound (see  comment) (Incision to R Shoulder, R Arm, L shoulder; R Shoulder abscess)  Last BM:  6/22  Height:   Ht Readings from Last 1 Encounters:  08/31/15 5\' 7"  (1.702 m)    Weight:   Wt Readings from Last 1 Encounters:  08/31/15 225 lb (102.059 kg)    Ideal Body Weight:  67.27 kg  BMI:  Body mass index is 35.23 kg/(m^2).  Estimated Nutritional Needs:   Kcal:  1900-2200 calories  Protein:  100-120 grams  Fluid:  >/= 1.8 L  EDUCATION NEEDS:   No education needs identified at this time  [name] Anis. Camrynn Mcclintic, MS, RD LDN Inpatient Clinical Dietitian Pager [phone]

## 2015-09-09 NOTE — Progress Notes (Signed)
PROGRESS NOTE    Adrion Stickrod  [id number] DOB: 1985/07/04 DOA: 08/30/2015  PCP: No primary care provider on file.   Brief Narrative:  30 year old with hypertension, diabetes who is not on medication and asthma presented on 6/13 with an abscess on his left shoulder. Was admitted by general surgery with drainage of the abscess. Sugars were severely uncontrolled and internal medicine was consulted. He was eventually transferred to internal medicine. Subsequently right hand was noted to become swollen and therefore hand surgery was consulted. He was found to have an infected hematoma and taken to the OR for an I & D of the hand. Culture from shoulder growing MRSA and staph lugdenensis. Subsequently noted to have a small ulcer on right shoulder- I and D done on 6/19 at bedside.   Subjective: No further diarrhea. Wants to go home    Assessment & Plan:   Principal Problem:   Abscess of left shoulder and right hand -Culture from left shoulder Growing staph Lugdunensis and MRSA as mentioned above-continue vancomycin and Zosyn- still has copious drainage from ulcer - wound cultures from right hand growing MRSA and small amount of negative rods  - New right shoulder abscess drained by gen surgery-   gr + cocci as well -Blood cultures negative  -Dr Megan Salon recommend discontinuation of vancomycin today and start doxycycline.  -needs wound care , might benefit from SNF.  Continue with doxycycline  WBC trending down.     Uncontrolled diabetes mellitus  -Unable to afford medications and unable to afford a PCP - Hemoglobin A1c 13.7 - lantus decreased to 35 units to avoid hypoglycemia, CNG this am at 70  AKI - cr increased this am. Vancomycin discontinue.  -continue with  IV fluids. Was without fluids overnight.  Cr increased to 1.7   HTN; start Norvasc.    Homeless - no health insurance - social work/ case management aware   DVT prophylaxis: SCDs, heparin  Code Status: Full  code Family Communication:  Disposition Plan: home  Consultants:   gen surgery and hand surgery Procedures:   I and D  Antimicrobials:  Anti-infectives    Start     Dose/Rate Route Frequency Ordered Stop   09/07/15 1200  doxycycline (VIBRA-TABS) tablet 100 mg     100 mg Oral Every 12 hours 09/07/15 1109     09/04/15 1600  vancomycin (VANCOCIN) IVPB 1000 mg/200 mL premix  Status:  Discontinued     1,000 mg 200 mL/hr over 60 Minutes Intravenous Every 8 hours 09/04/15 0913 09/07/15 1108   09/04/15 1000  vancomycin (VANCOCIN) IVPB 1000 mg/200 mL premix  Status:  Discontinued     1,000 mg 200 mL/hr over 60 Minutes Intravenous Every 8 hours 09/04/15 0910 09/04/15 0913   09/04/15 0915  vancomycin (VANCOCIN) 1,250 mg in sodium chloride 0.9 % 250 mL IVPB     1,250 mg 166.7 mL/hr over 90 Minutes Intravenous  Once 09/04/15 0913 09/04/15 1045   09/03/15 1022  polymyxin B 500,000 Units, bacitracin 50,000 Units in sodium chloride irrigation 0.9 % 500 mL irrigation  Status:  Discontinued       As needed 09/03/15 1022 09/03/15 1058   09/03/15 0800  piperacillin-tazobactam (ZOSYN) IVPB 3.375 g  Status:  Discontinued     3.375 g 12.5 mL/hr over 240 Minutes Intravenous Every 8 hours 09/02/15 2345 09/06/15 1743   09/01/15 1600  vancomycin (VANCOCIN) 1,250 mg in sodium chloride 0.9 % 250 mL IVPB  Status:  Discontinued     1,250  mg 166.7 mL/hr over 90 Minutes Intravenous Every 8 hours 09/01/15 1551 09/04/15 0910   08/31/15 0800  piperacillin-tazobactam (ZOSYN) IVPB 3.375 g  Status:  Discontinued     3.375 g 12.5 mL/hr over 240 Minutes Intravenous Every 8 hours 08/31/15 0532 09/02/15 2345   08/31/15 0800  vancomycin (VANCOCIN) IVPB 1000 mg/200 mL premix  Status:  Discontinued     1,000 mg 200 mL/hr over 60 Minutes Intravenous Every 8 hours 08/31/15 0532 09/01/15 1551   08/31/15 0115  vancomycin (VANCOCIN) IVPB 1000 mg/200 mL premix     1,000 mg 200 mL/hr over 60 Minutes Intravenous  Once 08/31/15  0110 08/31/15 0407   08/31/15 0115  piperacillin-tazobactam (ZOSYN) IVPB 3.375 g     3.375 g 100 mL/hr over 30 Minutes Intravenous  Once 08/31/15 0110 08/31/15 0256       Objective: Filed Vitals:   09/08/15 0753 09/08/15 1338 09/08/15 2118 09/09/15 0539  BP: 154/71 165/98 155/91 177/88  Pulse: 74 86 91 87  Temp: 97.9 F (36.6 C) 98.6 F (37 C) 98.4 F (36.9 C) 98.2 F (36.8 C)  TempSrc: Oral Oral Oral Oral  Resp: 16 16 16 16   Height:      Weight:      SpO2: 99% 100% 100% 99%    Intake/Output Summary (Last 24 hours) at 09/09/15 0958 Last data filed at 09/09/15 0500  Gross per 24 hour  Intake 3221.25 ml  Output    800 ml  Net 2421.25 ml   Filed Weights   08/31/15 0129  Weight: 102.059 kg (225 lb)    Examination: General exam: Appears comfortable  HEENT: PERRLA, oral mucosa moist, no sclera icterus or thrush Respiratory system: Clear to auscultation. Respiratory effort normal. Cardiovascular system: S1 & S2 heard, RRR.  No murmurs  Gastrointestinal system: Abdomen soft, non-tender, nondistended. Normal bowel sound. No organomegaly Central nervous system: Alert and oriented. No focal neurological deficits. Extremities: No cyanosis, clubbing or edema Skin: large ulcer on post aspect L shoulder draining serosanguinous fluid- right hand bandaged.- smaller ulcer on post aspect of right shoulder with packing.   Psychiatry:  Mood & affect appropriate.     Data Reviewed: I have personally reviewed following labs and imaging studies  CBC:  Recent Labs Lab 09/04/15 0700 09/05/15 0429 09/06/15 0450 09/08/15 0402 09/09/15 0432  WBC 17.2* 16.1* 11.8* 13.6* 12.1*  HGB 11.1* 11.8* 12.9* 10.9* 11.0*  HCT 32.7* 34.9* 37.7* 32.6* 32.4*  MCV 86.5 85.5 88.1 86.5 85.9  PLT 263 296 224 328 [id number]   Basic Metabolic Panel:  Recent Labs Lab 09/05/15 0429 09/06/15 0450 09/07/15 0815 09/07/15 1144 09/08/15 0402 09/09/15 0432  NA 139 134*  --  139 139 140  K 3.2* 4.0  --   3.8 3.1* 3.6  CL 104 101  --  104 104 105  CO2 28 25  --  28 28 28   GLUCOSE 104* 176*  --  183* 78 127*  BUN 6 18  --  6 8 11   CREATININE 1.41* 0.97 1.69* 1.66* 1.62* 1.70*  CALCIUM 8.7* 8.7*  --  8.9 8.5* 9.0   GFR: Estimated Creatinine Clearance: 73 mL/min (by C-G formula based on Cr of 1.7). Liver Function Tests: No results for input(s): AST, ALT, ALKPHOS, BILITOT, PROT, ALBUMIN in the last 168 hours. No results for input(s): LIPASE, AMYLASE in the last 168 hours. No results for input(s): AMMONIA in the last 168 hours. Coagulation Profile: No results for input(s): INR, PROTIME in the last  168 hours. Cardiac Enzymes: No results for input(s): CKTOTAL, CKMB, CKMBINDEX, TROPONINI in the last 168 hours. BNP (last 3 results) No results for input(s): PROBNP in the last 8760 hours. HbA1C: No results for input(s): HGBA1C in the last 72 hours. CBG:  Recent Labs Lab 09/08/15 0737 09/08/15 1159 09/08/15 1713 09/08/15 2149 09/09/15 0732  GLUCAP 104* 162* 200* 194* 99   Lipid Profile: No results for input(s): CHOL, HDL, LDLCALC, TRIG, CHOLHDL, LDLDIRECT in the last 72 hours. Thyroid Function Tests: No results for input(s): TSH, T4TOTAL, FREET4, T3FREE, THYROIDAB in the last 72 hours. Anemia Panel: No results for input(s): VITAMINB12, FOLATE, FERRITIN, TIBC, IRON, RETICCTPCT in the last 72 hours. Urine analysis: No results found for: COLORURINE, APPEARANCEUR, LABSPEC, PHURINE, GLUCOSEU, HGBUR, BILIRUBINUR, KETONESUR, PROTEINUR, UROBILINOGEN, NITRITE, LEUKOCYTESUR Sepsis Labs: @LABRCNTIP (procalcitonin:4,lacticidven:4) ) Recent Results (from the past 240 hour(s))  Culture, blood (Routine X 2) w Reflex to ID Panel     Status: None   Collection Time: 08/31/15  1:03 AM  Result Value Ref Range Status   Specimen Description BLOOD RIGHT ANTECUBITAL  Final   Special Requests IN PEDIATRIC BOTTLE 4ML  Final   Culture   Final    NO GROWTH 5 DAYS Performed at [facility]     Report Status 09/05/2015 FINAL  Final  Culture, blood (Routine X 2) w Reflex to ID Panel     Status: None   Collection Time: 08/31/15  1:11 AM  Result Value Ref Range Status   Specimen Description BLOOD LEFT ANTECUBITAL  Final   Special Requests BOTTLES DRAWN AEROBIC AND ANAEROBIC 5ML  Final   Culture   Final    NO GROWTH 5 DAYS Performed at [facility]    Report Status 09/05/2015 FINAL  Final  Surgical pcr screen     Status: None   Collection Time: 08/31/15 11:00 AM  Result Value Ref Range Status   MRSA, PCR NEGATIVE NEGATIVE Final   Staphylococcus aureus NEGATIVE NEGATIVE Final    Comment:        The Xpert SA Assay (FDA approved for NASAL specimens in patients over 6 years of age), is one component of a comprehensive surveillance program.  Test performance has been validated by [facility] for patients greater than or equal to 43 year old. It is not intended to diagnose infection nor to guide or monitor treatment.   Aerobic/Anaerobic Culture (surgical/deep wound)     Status: None   Collection Time: 08/31/15 12:40 PM  Result Value Ref Range Status   Specimen Description ABSCESS LEFT SHOULDER IRRIGATION & DEBRIDEMENT  Final   Special Requests NONE  Final   Gram Stain   Final    ABUNDANT WBC PRESENT, PREDOMINANTLY PMN MODERATE GRAM POSITIVE COCCI IN CLUSTERS IN PAIRS    Culture   Final    MODERATE METHICILLIN RESISTANT STAPHYLOCOCCUS AUREUS MODERATE STAPHYLOCOCCUS LUGDUNENSIS NO ANAEROBES ISOLATED Performed at [facility]    Report Status 09/05/2015 FINAL  Final   Organism ID, Bacteria METHICILLIN RESISTANT STAPHYLOCOCCUS AUREUS  Final   Organism ID, Bacteria STAPHYLOCOCCUS LUGDUNENSIS  Final      Susceptibility   Staphylococcus lugdunensis - MIC*    CIPROFLOXACIN <=0.5 SENSITIVE Sensitive     ERYTHROMYCIN <=0.25 SENSITIVE Sensitive     GENTAMICIN <=0.5 SENSITIVE Sensitive     OXACILLIN 2 SENSITIVE Sensitive     TETRACYCLINE <=1 SENSITIVE Sensitive      VANCOMYCIN <=0.5 SENSITIVE Sensitive     TRIMETH/SULFA <=10 SENSITIVE Sensitive  CLINDAMYCIN <=0.25 SENSITIVE Sensitive     RIFAMPIN <=0.5 SENSITIVE Sensitive     Inducible Clindamycin NEGATIVE Sensitive     * MODERATE STAPHYLOCOCCUS LUGDUNENSIS   Methicillin resistant staphylococcus aureus - MIC*    CIPROFLOXACIN <=0.5 SENSITIVE Sensitive     ERYTHROMYCIN >=8 RESISTANT Resistant     GENTAMICIN <=0.5 SENSITIVE Sensitive     OXACILLIN >=4 RESISTANT Resistant     TETRACYCLINE <=1 SENSITIVE Sensitive     VANCOMYCIN <=0.5 SENSITIVE Sensitive     TRIMETH/SULFA <=10 SENSITIVE Sensitive     CLINDAMYCIN <=0.25 SENSITIVE Sensitive     RIFAMPIN <=0.5 SENSITIVE Sensitive     Inducible Clindamycin NEGATIVE Sensitive     * MODERATE METHICILLIN RESISTANT STAPHYLOCOCCUS AUREUS  Aerobic/Anaerobic Culture (surgical/deep wound)     Status: None   Collection Time: 09/03/15 10:40 AM  Result Value Ref Range Status   Specimen Description ABSCESS RIGHT ARM  Final   Special Requests NONE  Final   Gram Stain   Final    MODERATE WBC PRESENT, PREDOMINANTLY PMN RARE SQUAMOUS EPITHELIAL CELLS PRESENT FEW GRAM POSITIVE COCCI IN CLUSTERS    Culture   Final    MODERATE METHICILLIN RESISTANT STAPHYLOCOCCUS AUREUS NO ANAEROBES ISOLATED Performed at [facility]    Report Status 09/08/2015 FINAL  Final   Organism ID, Bacteria METHICILLIN RESISTANT STAPHYLOCOCCUS AUREUS  Final      Susceptibility   Methicillin resistant staphylococcus aureus - MIC*    CIPROFLOXACIN <=0.5 SENSITIVE Sensitive     ERYTHROMYCIN >=8 RESISTANT Resistant     GENTAMICIN <=0.5 SENSITIVE Sensitive     OXACILLIN >=4 RESISTANT Resistant     TETRACYCLINE <=1 SENSITIVE Sensitive     VANCOMYCIN 1 SENSITIVE Sensitive     TRIMETH/SULFA <=10 SENSITIVE Sensitive     CLINDAMYCIN <=0.25 SENSITIVE Sensitive     RIFAMPIN <=0.5 SENSITIVE Sensitive     Inducible Clindamycin NEGATIVE Sensitive     * MODERATE METHICILLIN RESISTANT  STAPHYLOCOCCUS AUREUS  Aerobic Culture (superficial specimen)     Status: None   Collection Time: 09/05/15 12:44 PM  Result Value Ref Range Status   Specimen Description WOUND RIGHT SHOULDER  Final   Special Requests NONE  Final   Gram Stain   Final    WBC PRESENT,BOTH PMN AND MONONUCLEAR ABUNDANT GRAM POSITIVE COCCI CRITICAL RESULT CALLED TO, READ BACK BY AND VERIFIED WITH: R.RIMANDO,RN 09/05/15 @2040  BY V.WILKINS CONFIRMED BY W.JOHNSON Performed at [facility]    Culture   Final    MODERATE METHICILLIN RESISTANT STAPHYLOCOCCUS AUREUS   Report Status 09/07/2015 FINAL  Final   Organism ID, Bacteria METHICILLIN RESISTANT STAPHYLOCOCCUS AUREUS  Final      Susceptibility   Methicillin resistant staphylococcus aureus - MIC*    CIPROFLOXACIN <=0.5 SENSITIVE Sensitive     ERYTHROMYCIN >=8 RESISTANT Resistant     GENTAMICIN <=0.5 SENSITIVE Sensitive     OXACILLIN >=4 RESISTANT Resistant     TETRACYCLINE <=1 SENSITIVE Sensitive     VANCOMYCIN 1 SENSITIVE Sensitive     TRIMETH/SULFA <=10 SENSITIVE Sensitive     CLINDAMYCIN <=0.25 SENSITIVE Sensitive     RIFAMPIN <=0.5 SENSITIVE Sensitive     Inducible Clindamycin NEGATIVE Sensitive     * MODERATE METHICILLIN RESISTANT STAPHYLOCOCCUS AUREUS         Radiology Studies: No results found.    Scheduled Meds: . amLODipine  5 mg Oral Daily  . doxycycline  100 mg Oral Q12H  . feeding supplement (GLUCERNA SHAKE)  237 mL Oral  BID BM  . heparin subcutaneous  5,000 Units Subcutaneous Q8H  . insulin aspart  0-20 Units Subcutaneous TID WC  . insulin aspart  0-5 Units Subcutaneous QHS  . insulin aspart  3 Units Subcutaneous TID WC  . insulin glargine  35 Units Subcutaneous BID  . mupirocin ointment   Topical BID  . nutrition supplement (JUVEN)  1 packet Oral BID BM   Continuous Infusions: . sodium chloride 100 mL/hr at 09/08/15 1142     LOS: 9 days    Time spent in minutes: 35    Laverle Pillard A, MD [facility] Pager: [email or web address] Password [facility] 09/09/2015, 9:58 AM

## 2015-09-10 DIAGNOSIS — N179 Acute kidney failure, unspecified: Secondary | ICD-10-CM

## 2015-09-10 LAB — BASIC METABOLIC PANEL
Anion gap: 7 (ref 5–15)
BUN: 14 mg/dL (ref 6–20)
CALCIUM: 9 mg/dL (ref 8.9–10.3)
CO2: 28 mmol/L (ref 22–32)
CREATININE: 1.74 mg/dL — AB (ref 0.61–1.24)
Chloride: 103 mmol/L (ref 101–111)
GFR calc Af Amer: 60 mL/min — ABNORMAL LOW (ref >60)
GFR, EST NON AFRICAN AMERICAN: 51 mL/min — AB (ref >60)
GLUCOSE: 89 mg/dL (ref 65–99)
POTASSIUM: 3.4 mmol/L — AB (ref 3.5–5.1)
SODIUM: 138 mmol/L (ref 135–145)

## 2015-09-10 LAB — GLUCOSE, CAPILLARY
Glucose-Capillary: 86 mg/dL (ref 65–99)
Glucose-Capillary: 219 mg/dL — ABNORMAL HIGH (ref 65–99)
Glucose-Capillary: 120 mg/dL — ABNORMAL HIGH (ref 65–99)
Glucose-Capillary: 205 mg/dL — ABNORMAL HIGH (ref 65–99)

## 2015-09-10 MED ORDER — AMLODIPINE BESYLATE 10 MG PO TABS: 10.0000 mg | ORAL_TABLET | Freq: Every day | ORAL | Status: DC

## 2015-09-10 MED ORDER — HYDRALAZINE HCL 20 MG/ML IJ SOLN: 10.0000 mg | Freq: Four times a day (QID) | INTRAMUSCULAR | Status: DC | PRN

## 2015-09-10 MED ORDER — POTASSIUM CHLORIDE CRYS ER 20 MEQ PO TBCR
20.0000 meq | EXTENDED_RELEASE_TABLET | Freq: Once | ORAL | Status: AC
  Administered 2015-09-10: 20 meq via ORAL
  Filled 2015-09-10: qty 1

## 2015-09-10 MED ORDER — AMLODIPINE BESYLATE 5 MG PO TABS
5.0000 mg | ORAL_TABLET | Freq: Once | ORAL | Status: AC
  Administered 2015-09-10: 5 mg via ORAL
  Filled 2015-09-10: qty 1

## 2015-09-10 NOTE — Progress Notes (Signed)
PROGRESS NOTE    [name] [name]  [id number] DOB: June 12, 1985 DOA: 08/30/2015  PCP: No primary care provider on file.   Brief Narrative:  30 year old with hypertension, diabetes who is not on medication and asthma presented on 6/13 with an abscess on his left shoulder. Was admitted by general surgery with drainage of the abscess. Sugars were severely uncontrolled and internal medicine was consulted. He was eventually transferred to internal medicine. Subsequently right hand was noted to become swollen and therefore hand surgery was consulted. He was found to have an infected hematoma and taken to the OR for an I & D of the hand. Culture from shoulder growing MRSA and staph lugdenensis. Subsequently noted to have a small ulcer on right shoulder- I and D done on 6/19 at bedside.   Subjective: No further diarrhea. Wants to go home    Assessment & Plan:  Abscess of left shoulder and right hand -Culture from left shoulder Growing staph Lugdunensis and MRSA as mentioned above-continue vancomycin and Zosyn- still has copious drainage from ulcer - wound cultures from right hand growing MRSA and small amount of negative rods  - New right shoulder abscess drained by gen surgery-   gr + cocci as well -Blood cultures negative  -Dr Megan Salon recommend discontinuation of vancomycin today and start doxycycline.  -needs wound care , might benefit from SNF.  Continue with doxycycline  WBC trending down.     Uncontrolled diabetes mellitus  -Unable to afford medications and unable to afford a PCP - Hemoglobin A1c 13.7 - lantus decreased to 35 units to avoid hypoglycemia, CNG this am at 70  AKI - cr increased this am. Vancomycin discontinue.  -continue with  IV fluids. Was without fluids overnight.  Cr stable  to 1.7  If continue to decrease tomorrow plan to discharge patient.   HTN; start Norvasc. Increased dose to 10 mg today. PRN hydralazine ordered.     Homeless - no health insurance -  social work/ case management aware   DVT prophylaxis: SCDs, heparin  Code Status: Full code Family Communication:  Disposition Plan: home  Consultants:   gen surgery and hand surgery Procedures:   I and D  Antimicrobials:  Anti-infectives    Start     Dose/Rate Route Frequency Ordered Stop   09/07/15 1200  doxycycline (VIBRA-TABS) tablet 100 mg     100 mg Oral Every 12 hours 09/07/15 1109     09/04/15 1600  vancomycin (VANCOCIN) IVPB 1000 mg/200 mL premix  Status:  Discontinued     1,000 mg 200 mL/hr over 60 Minutes Intravenous Every 8 hours 09/04/15 0913 09/07/15 1108   09/04/15 1000  vancomycin (VANCOCIN) IVPB 1000 mg/200 mL premix  Status:  Discontinued     1,000 mg 200 mL/hr over 60 Minutes Intravenous Every 8 hours 09/04/15 0910 09/04/15 0913   09/04/15 0915  vancomycin (VANCOCIN) 1,250 mg in sodium chloride 0.9 % 250 mL IVPB     1,250 mg 166.7 mL/hr over 90 Minutes Intravenous  Once 09/04/15 0913 09/04/15 1045   09/03/15 1022  polymyxin B 500,000 Units, bacitracin 50,000 Units in sodium chloride irrigation 0.9 % 500 mL irrigation  Status:  Discontinued       As needed 09/03/15 1022 09/03/15 1058   09/03/15 0800  piperacillin-tazobactam (ZOSYN) IVPB 3.375 g  Status:  Discontinued     3.375 g 12.5 mL/hr over 240 Minutes Intravenous Every 8 hours 09/02/15 2345 09/06/15 1743   09/01/15 1600  vancomycin (VANCOCIN) 1,250 mg in  sodium chloride 0.9 % 250 mL IVPB  Status:  Discontinued     1,250 mg 166.7 mL/hr over 90 Minutes Intravenous Every 8 hours 09/01/15 1551 09/04/15 0910   08/31/15 0800  piperacillin-tazobactam (ZOSYN) IVPB 3.375 g  Status:  Discontinued     3.375 g 12.5 mL/hr over 240 Minutes Intravenous Every 8 hours 08/31/15 0532 09/02/15 2345   08/31/15 0800  vancomycin (VANCOCIN) IVPB 1000 mg/200 mL premix  Status:  Discontinued     1,000 mg 200 mL/hr over 60 Minutes Intravenous Every 8 hours 08/31/15 0532 09/01/15 1551   08/31/15 0115  vancomycin (VANCOCIN) IVPB  1000 mg/200 mL premix     1,000 mg 200 mL/hr over 60 Minutes Intravenous  Once 08/31/15 0110 08/31/15 0407   08/31/15 0115  piperacillin-tazobactam (ZOSYN) IVPB 3.375 g     3.375 g 100 mL/hr over 30 Minutes Intravenous  Once 08/31/15 0110 08/31/15 0256       Objective: Filed Vitals:   09/09/15 0539 09/09/15 1429 09/09/15 2213 09/10/15 0621  BP: 177/88 154/83 190/76 182/96  Pulse: 87 88 88 79  Temp: 98.2 F (36.8 C) 98.1 F (36.7 C) 98.7 F (37.1 C) 98.1 F (36.7 C)  TempSrc: Oral Oral Oral Oral  Resp: 16 16 18 18   Height:      Weight:      SpO2: 99% 100% 100% 99%    Intake/Output Summary (Last 24 hours) at 09/10/15 1236 Last data filed at 09/10/15 1041  Gross per 24 hour  Intake   3455 ml  Output    475 ml  Net   2980 ml   Filed Weights   08/31/15 0129  Weight: 102.059 kg (225 lb)    Examination: General exam: Appears comfortable  HEENT: PERRLA, oral mucosa moist, no sclera icterus or thrush Respiratory system: Clear to auscultation. Respiratory effort normal. Cardiovascular system: S1 & S2 heard, RRR.  No murmurs  Gastrointestinal system: Abdomen soft, non-tender, nondistended. Normal bowel sound. No organomegaly Central nervous system: Alert and oriented. No focal neurological deficits. Extremities: No cyanosis, clubbing or edema Skin: large ulcer on post aspect L shoulder draining serosanguinous fluid- right hand bandaged.- smaller ulcer on post aspect of right shoulder with packing.   Psychiatry:  Mood & affect appropriate.     Data Reviewed: I have personally reviewed following labs and imaging studies  CBC:  Recent Labs Lab 09/04/15 0700 09/05/15 0429 09/06/15 0450 09/08/15 0402 09/09/15 0432  WBC 17.2* 16.1* 11.8* 13.6* 12.1*  HGB 11.1* 11.8* 12.9* 10.9* 11.0*  HCT 32.7* 34.9* 37.7* 32.6* 32.4*  MCV 86.5 85.5 88.1 86.5 85.9  PLT 263 296 224 328 [id number]   Basic Metabolic Panel:  Recent Labs Lab 09/06/15 0450 09/07/15 0815 09/07/15 1144  09/08/15 0402 09/09/15 0432 09/10/15 0742  NA 134*  --  139 139 140 138  K 4.0  --  3.8 3.1* 3.6 3.4*  CL 101  --  104 104 105 103  CO2 25  --  28 28 28 28   GLUCOSE 176*  --  183* 78 127* 89  BUN 18  --  6 8 11 14   CREATININE 0.97 1.69* 1.66* 1.62* 1.70* 1.74*  CALCIUM 8.7*  --  8.9 8.5* 9.0 9.0   GFR: Estimated Creatinine Clearance: 71.3 mL/min (by C-G formula based on Cr of 1.74). Liver Function Tests: No results for input(s): AST, ALT, ALKPHOS, BILITOT, PROT, ALBUMIN in the last 168 hours. No results for input(s): LIPASE, AMYLASE in the last 168 hours. No  results for input(s): AMMONIA in the last 168 hours. Coagulation Profile: No results for input(s): INR, PROTIME in the last 168 hours. Cardiac Enzymes: No results for input(s): CKTOTAL, CKMB, CKMBINDEX, TROPONINI in the last 168 hours. BNP (last 3 results) No results for input(s): PROBNP in the last 8760 hours. HbA1C: No results for input(s): HGBA1C in the last 72 hours. CBG:  Recent Labs Lab 09/09/15 1145 09/09/15 1622 09/09/15 2209 09/10/15 0733 09/10/15 1204  GLUCAP 191* 75 155* 86 219*   Lipid Profile: No results for input(s): CHOL, HDL, LDLCALC, TRIG, CHOLHDL, LDLDIRECT in the last 72 hours. Thyroid Function Tests: No results for input(s): TSH, T4TOTAL, FREET4, T3FREE, THYROIDAB in the last 72 hours. Anemia Panel: No results for input(s): VITAMINB12, FOLATE, FERRITIN, TIBC, IRON, RETICCTPCT in the last 72 hours. Urine analysis: No results found for: COLORURINE, APPEARANCEUR, LABSPEC, PHURINE, GLUCOSEU, HGBUR, BILIRUBINUR, KETONESUR, PROTEINUR, UROBILINOGEN, NITRITE, LEUKOCYTESUR Sepsis Labs: @LABRCNTIP (procalcitonin:4,lacticidven:4) ) Recent Results (from the past 240 hour(s))  Aerobic/Anaerobic Culture (surgical/deep wound)     Status: None   Collection Time: 08/31/15 12:40 PM  Result Value Ref Range Status   Specimen Description ABSCESS LEFT SHOULDER IRRIGATION & DEBRIDEMENT  Final   Special Requests  NONE  Final   Gram Stain   Final    ABUNDANT WBC PRESENT, PREDOMINANTLY PMN MODERATE GRAM POSITIVE COCCI IN CLUSTERS IN PAIRS    Culture   Final    MODERATE METHICILLIN RESISTANT STAPHYLOCOCCUS AUREUS MODERATE STAPHYLOCOCCUS LUGDUNENSIS NO ANAEROBES ISOLATED Performed at [facility]    Report Status 09/05/2015 FINAL  Final   Organism ID, Bacteria METHICILLIN RESISTANT STAPHYLOCOCCUS AUREUS  Final   Organism ID, Bacteria STAPHYLOCOCCUS LUGDUNENSIS  Final      Susceptibility   Staphylococcus lugdunensis - MIC*    CIPROFLOXACIN <=0.5 SENSITIVE Sensitive     ERYTHROMYCIN <=0.25 SENSITIVE Sensitive     GENTAMICIN <=0.5 SENSITIVE Sensitive     OXACILLIN 2 SENSITIVE Sensitive     TETRACYCLINE <=1 SENSITIVE Sensitive     VANCOMYCIN <=0.5 SENSITIVE Sensitive     TRIMETH/SULFA <=10 SENSITIVE Sensitive     CLINDAMYCIN <=0.25 SENSITIVE Sensitive     RIFAMPIN <=0.5 SENSITIVE Sensitive     Inducible Clindamycin NEGATIVE Sensitive     * MODERATE STAPHYLOCOCCUS LUGDUNENSIS   Methicillin resistant staphylococcus aureus - MIC*    CIPROFLOXACIN <=0.5 SENSITIVE Sensitive     ERYTHROMYCIN >=8 RESISTANT Resistant     GENTAMICIN <=0.5 SENSITIVE Sensitive     OXACILLIN >=4 RESISTANT Resistant     TETRACYCLINE <=1 SENSITIVE Sensitive     VANCOMYCIN <=0.5 SENSITIVE Sensitive     TRIMETH/SULFA <=10 SENSITIVE Sensitive     CLINDAMYCIN <=0.25 SENSITIVE Sensitive     RIFAMPIN <=0.5 SENSITIVE Sensitive     Inducible Clindamycin NEGATIVE Sensitive     * MODERATE METHICILLIN RESISTANT STAPHYLOCOCCUS AUREUS  Aerobic/Anaerobic Culture (surgical/deep wound)     Status: None   Collection Time: 09/03/15 10:40 AM  Result Value Ref Range Status   Specimen Description ABSCESS RIGHT ARM  Final   Special Requests NONE  Final   Gram Stain   Final    MODERATE WBC PRESENT, PREDOMINANTLY PMN RARE SQUAMOUS EPITHELIAL CELLS PRESENT FEW GRAM POSITIVE COCCI IN CLUSTERS    Culture   Final    MODERATE  METHICILLIN RESISTANT STAPHYLOCOCCUS AUREUS NO ANAEROBES ISOLATED Performed at [facility]    Report Status 09/08/2015 FINAL  Final   Organism ID, Bacteria METHICILLIN RESISTANT STAPHYLOCOCCUS AUREUS  Final      Susceptibility  Methicillin resistant staphylococcus aureus - MIC*    CIPROFLOXACIN <=0.5 SENSITIVE Sensitive     ERYTHROMYCIN >=8 RESISTANT Resistant     GENTAMICIN <=0.5 SENSITIVE Sensitive     OXACILLIN >=4 RESISTANT Resistant     TETRACYCLINE <=1 SENSITIVE Sensitive     VANCOMYCIN 1 SENSITIVE Sensitive     TRIMETH/SULFA <=10 SENSITIVE Sensitive     CLINDAMYCIN <=0.25 SENSITIVE Sensitive     RIFAMPIN <=0.5 SENSITIVE Sensitive     Inducible Clindamycin NEGATIVE Sensitive     * MODERATE METHICILLIN RESISTANT STAPHYLOCOCCUS AUREUS  Aerobic Culture (superficial specimen)     Status: None   Collection Time: 09/05/15 12:44 PM  Result Value Ref Range Status   Specimen Description WOUND RIGHT SHOULDER  Final   Special Requests NONE  Final   Gram Stain   Final    WBC PRESENT,BOTH PMN AND MONONUCLEAR ABUNDANT GRAM POSITIVE COCCI CRITICAL RESULT CALLED TO, READ BACK BY AND VERIFIED WITH: R.RIMANDO,RN 09/05/15 @2040  BY V.WILKINS CONFIRMED BY W.JOHNSON Performed at [facility]    Culture   Final    MODERATE METHICILLIN RESISTANT STAPHYLOCOCCUS AUREUS   Report Status 09/07/2015 FINAL  Final   Organism ID, Bacteria METHICILLIN RESISTANT STAPHYLOCOCCUS AUREUS  Final      Susceptibility   Methicillin resistant staphylococcus aureus - MIC*    CIPROFLOXACIN <=0.5 SENSITIVE Sensitive     ERYTHROMYCIN >=8 RESISTANT Resistant     GENTAMICIN <=0.5 SENSITIVE Sensitive     OXACILLIN >=4 RESISTANT Resistant     TETRACYCLINE <=1 SENSITIVE Sensitive     VANCOMYCIN 1 SENSITIVE Sensitive     TRIMETH/SULFA <=10 SENSITIVE Sensitive     CLINDAMYCIN <=0.25 SENSITIVE Sensitive     RIFAMPIN <=0.5 SENSITIVE Sensitive     Inducible Clindamycin NEGATIVE Sensitive     * MODERATE  METHICILLIN RESISTANT STAPHYLOCOCCUS AUREUS         Radiology Studies: No results found.    Scheduled Meds: . [START ON 09/11/2015] amLODipine  10 mg Oral Daily  . doxycycline  100 mg Oral Q12H  . feeding supplement (GLUCERNA SHAKE)  237 mL Oral BID BM  . heparin subcutaneous  5,000 Units Subcutaneous Q8H  . insulin aspart  0-20 Units Subcutaneous TID WC  . insulin aspart  0-5 Units Subcutaneous QHS  . insulin aspart  3 Units Subcutaneous TID WC  . insulin glargine  35 Units Subcutaneous BID  . mupirocin ointment   Topical BID  . nutrition supplement (JUVEN)  1 packet Oral BID BM   Continuous Infusions: . sodium chloride 125 mL/hr at 09/10/15 0600     LOS: 10 days    Time spent in minutes: 35    Nyaira Hodgens A, MD [facility] Pager: [email or web address] Password [facility] 09/10/2015, 12:36 PM

## 2015-09-11 LAB — BASIC METABOLIC PANEL
ANION GAP: 6 (ref 5–15)
BUN: 18 mg/dL (ref 6–20)
CALCIUM: 9.3 mg/dL (ref 8.9–10.3)
CO2: 29 mmol/L (ref 22–32)
CREATININE: 1.61 mg/dL — AB (ref 0.61–1.24)
Chloride: 105 mmol/L (ref 101–111)
GFR, EST NON AFRICAN AMERICAN: 56 mL/min — AB (ref >60)
Glucose, Bld: 90 mg/dL (ref 65–99)
Potassium: 3.6 mmol/L (ref 3.5–5.1)
SODIUM: 140 mmol/L (ref 135–145)

## 2015-09-11 LAB — GLUCOSE, CAPILLARY: Glucose-Capillary: 111 mg/dL — ABNORMAL HIGH (ref 65–99)

## 2015-09-11 MED ORDER — AMLODIPINE BESYLATE 5 MG PO TABS: 5.0000 mg | ORAL_TABLET | Freq: Every day | ORAL | Status: DC

## 2015-09-11 MED ORDER — AMLODIPINE BESYLATE 5 MG PO TABS
5.0000 mg | ORAL_TABLET | Freq: Every day | ORAL | Status: DC
  Administered 2015-09-11: 5 mg via ORAL
  Filled 2015-09-11: qty 1

## 2015-09-11 MED ORDER — "INSULIN SYRINGE 27G X 1/2"" 1 ML MISC": 1.0000 "application " | Freq: Two times a day (BID) | Status: DC

## 2015-09-11 MED ORDER — AMLODIPINE BESYLATE 10 MG PO TABS: 10.0000 mg | ORAL_TABLET | Freq: Every day | ORAL | Status: DC

## 2015-09-11 MED ORDER — DOXYCYCLINE HYCLATE 100 MG PO TABS: 100.0000 mg | ORAL_TABLET | Freq: Two times a day (BID) | ORAL | Status: DC

## 2015-09-11 MED ORDER — HYDROCODONE-ACETAMINOPHEN 5-325 MG PO TABS: 1.0000 | ORAL_TABLET | ORAL | Status: DC | PRN

## 2015-09-11 MED ORDER — JUVEN PO PACK: 1.0000 | PACK | Freq: Two times a day (BID) | ORAL | Status: DC

## 2015-09-11 MED ORDER — ACETAMINOPHEN 325 MG PO TABS: 650.0000 mg | ORAL_TABLET | Freq: Four times a day (QID) | ORAL | Status: DC | PRN

## 2015-09-11 MED ORDER — INSULIN GLARGINE 100 UNIT/ML ~~LOC~~ SOLN: 35.0000 [IU] | Freq: Two times a day (BID) | SUBCUTANEOUS | Status: DC

## 2015-09-11 NOTE — Progress Notes (Signed)
Pt's vitals are WNL, tolerating diet and pain is under control. Discussed discharge instructions with pt. Also demonstrated how to properly change dressing with wife who was on the phone the entire time, but stated she felt comfortable changing dressings. Discharged to home with prescriptions.

## 2015-09-11 NOTE — Care Management Note (Addendum)
Case Management Note  Patient Details  Name: Dontaye Hrabik MRN: [id number] Date of Birth: 11/05/1985  Subjective/Objective:   Abscess of left shoulder and right hand                 Action/Plan: Discharge Planning: AVS reviewed: Contacted [facility] for scheduled dc home today with [facility] RN for dressing changes. Pt unable to locate [facility] letter. Provided pt with another [facility] letter to receive his medications.    Expected Discharge Date:  09/11/2015             Expected Discharge Plan:  [place]  In-House Referral:  Clinical Social Work  Discharge planning Services  CM Consult, [facility], [place], Medication Assistance  Post Acute Care Choice:  Home Health Choice offered to:  Patient  DME Arranged:  N/A DME Agency:  NA  HH Arranged:  RN [place] Agency:  [place]  Status of Service:  Completed, signed off  If discussed at [employer] of Stay Meetings, dates discussed:    Additional Comments:  Erenest Rasher, RN 09/11/2015, 11:36 AM

## 2015-09-11 NOTE — Progress Notes (Signed)
[facility] is unable to accept referral due to safety issues/concerns. CM Jonnie Finner aware.

## 2015-09-11 NOTE — Discharge Summary (Signed)
Physician Discharge Summary  [name] [name] [id number] DOB: 08-May-1985 DOA: 08/30/2015  PCP: No primary care provider on file.  Admit date: 08/30/2015 Discharge date: 09/11/2015  Admitted From: Home  Disposition:  Home   Recommendations for Outpatient Follow-up:  1. Follow up with PCP in 1-2 weeks 2. Please obtain BMP/CBC in one week 3. Please follow up on the following pending results:  Home Health: no  Discharge Condition: Stable/  CODE STATUS: Full code.  Diet recommendation: Carb Modified   Brief/Interim Summary: 30 year old with hypertension, diabetes who is not on medication and asthma presented on 6/13 with an abscess on his left shoulder. Was admitted by general surgery with drainage of the abscess. Sugars were severely uncontrolled and internal medicine was consulted. He was eventually transferred to internal medicine. Subsequently right hand was noted to become swollen and therefore hand surgery was consulted. He was found to have an infected hematoma and taken to the OR for an I & D of the hand. Culture from shoulder growing MRSA and staph lugdenensis. Subsequently noted to have a small ulcer on right shoulder- I and D done on 6/19 at bedside.   Subjective: No further diarrhea. Wants to go home    Assessment & Plan:  Abscess of left shoulder and right hand -Culture from left shoulder Growing staph Lugdunensis and MRSA as mentioned above-continue vancomycin and Zosyn- still has copious drainage from ulcer - wound cultures from right hand growing MRSA and small amount of negative rods  - New right shoulder abscess drained by gen surgery- gr + cocci as well -Blood cultures negative  -Dr Megan Salon recommend discontinuation of vancomycin today and start doxycycline.  Continue with doxycycline will provide 4 more days.  WBC trending down.    Uncontrolled diabetes mellitus  -Unable to afford medications and unable to afford a PCP - Hemoglobin A1c 13.7 - lantus  35  units  AKI -Vancomycin discontinue.  -received  IV fluids. Good urine out put/  -cr trending down at 1.6.  -needs close follow up, patient aware. Needs repeat renal function.   HTN; Continue with norvasc 5 mg daily   Discharge Diagnoses:    MRSA (methicillin resistant staph aureus) culture positive   AKI (acute kidney injury) ([place])   Abscess of left shoulder   Uncontrolled diabetes mellitus ([place])   Abscess of right arm   Abscess of right shoulder   Normocytic anemia   Obesity, morbid ([facility])   Hypertension   Recurrent boils   Abscess and cellulitis    Discharge Instructions  Discharge Instructions    Diet - low sodium heart healthy    Complete by:  As directed      Increase activity slowly    Complete by:  As directed             Medication List    TAKE these medications        acetaminophen 325 MG tablet  Commonly known as:  TYLENOL  Take 2 tablets (650 mg total) by mouth every 6 (six) hours as needed for mild pain (or temp > 100).     amLODipine 10 MG tablet  Commonly known as:  NORVASC  Take 1 tablet (10 mg total) by mouth daily.     doxycycline 100 MG tablet  Commonly known as:  VIBRA-TABS  Take 1 tablet (100 mg total) by mouth every 12 (twelve) hours.     insulin glargine 100 UNIT/ML injection  Commonly known as:  LANTUS  Inject 0.35 mLs (35 Units  total) into the skin 2 (two) times daily.     nutrition supplement (JUVEN) Pack  Take 1 packet by mouth 2 (two) times daily between meals.           Follow-up Information    Follow up with [facility].   Specialty:  Internal Medicine   Why:  you have an appointment with this clinic to establish a primary care physician and to help you with your diabetes and follow up medical care on July 21 at 09:00.   Contact information:   [address] [phone]      Follow up with [facility] On 09/14/2015.   Specialty:  General Surgery    Why:  Your appointment is at 12:00 noon, be at the office for check in 30 minutes before for check in.   Contact information:   [place] [address] [phone]      No Known Allergies  Consultations:  ID  Surgery    Procedures/Studies: Dg Wrist Complete Right  08/31/2015  CLINICAL DATA:  Pain and swelling of the right wrist for several days after injury. EXAM: RIGHT WRIST - COMPLETE 3+ VIEW COMPARISON:  None. FINDINGS: Old fracture deformity of the right fifth metacarpal bone. No evidence of acute fracture or dislocation in the right wrist. No focal bone lesion or bone destruction. Bone cortex appears intact. Soft tissues are unremarkable. IMPRESSION: No acute bony abnormalities. Electronically Signed   By: Lucienne Capers M.D.   On: 08/31/2015 00:13   Ct Chest W Contrast  08/31/2015  CLINICAL DATA:  Left axillary abscess going into the left back. Elevated white cells. History of hypertension, asthma, and diabetes. EXAM: CT CHEST WITH CONTRAST TECHNIQUE: Multidetector CT imaging of the chest was performed during intravenous contrast administration. CONTRAST:  75 mL Isovue-300 COMPARISON:  None. FINDINGS: There is a loculated fluid collection in the subcutaneous fat posterior to the left shoulder measuring about 5.9 x 8.4 x 7.9 cm. Peripheral enhancement. Appearance is nonspecific but would be compatible with history of abscess. Mild stranding in the surrounding fatty tissues. Lesion extends from the skin to the muscle layer but does not appear to involve muscular tissues. Enlarged lymph nodes in the left axilla are probably reactive. Normal heart size. Normal caliber thoracic aorta. Esophagus is decompressed. No significant lymphadenopathy in the mediastinum. Mild dependent changes in the lung bases. No focal airspace disease or consolidation. No pleural effusions. No pneumothorax. Airways are patent. Included portions of the upper abdominal organs are grossly  unremarkable. No destructive bone lesions. IMPRESSION: Loculated subcutaneous fluid collection posterior to the left shoulder consistent with history of abscess. Enlarged lymph nodes in the left axilla are probably reactive or inflammatory. No evidence of active pulmonary disease. Electronically Signed   By: Lucienne Capers M.D.   On: 08/31/2015 01:56   Mr Forearm Right Wo/w Cm  09/03/2015  CLINICAL DATA:  Diabetic patient pain and swelling about the right forearm for several days. Symptoms are worst distally. No known injury. EXAM: MRI OF THE RIGHT FOREARM WITHOUT AND WITH CONTRAST TECHNIQUE: Multiplanar, multisequence MR imaging was performed both before and after administration of intravenous contrast. CONTRAST:  20 ml MULTIHANCE GADOBENATE DIMEGLUMINE 529 MG/ML IV SOLN COMPARISON:  Plain films of the right wrist 08/31/2015 and 08/30/2015. FINDINGS: Extensive subcutaneous edema and enhancement about the forearm consistent with cellulitis are seen. Edema and enhancement are also identified in the musculature about the  medial aspect of the forearm, best seen in the flexor carpi ulnaris, flexor digitorum longus and flexor digitorum superficialis. A rim enhancing fluid collection measuring 2.0 cm transverse by up to 1.1 cm AP by 6.0 cm long is seen in the flexor carpi ulnaris centered approximately 3 cm proximal to the fovea of the ulna and is consistent with abscess. No other abscess is identified. No evidence of septic joint is seen. No bone marrow signal abnormality to suggest osteomyelitis is identified. IMPRESSION: Intense appearing cellulitis about the right forearm with myositis of the flexor carpi ulnaris, flexor digitorum longus and flexor digitorum superficialis. Intramuscular abscess in the distal flexor carpi ulnaris consistent with pyomyositis is identified. Negative for osteomyelitis or septic joint. Electronically Signed   By: Inge Rise M.D.   On: 09/03/2015 08:54   Dg Hand Complete  Right  08/31/2015  CLINICAL DATA:  Hit a wall 1 week ago. Ulnar-sided hand pain. Redness and swelling. EXAM: RIGHT HAND - COMPLETE 3+ VIEW COMPARISON:  RIGHT wrist radiograph August 30, 2015 FINDINGS: There is no evidence of fracture or dislocation. There is no evidence of arthropathy or other focal bone abnormality. Soft tissues are unremarkable. IMPRESSION: Negative. Electronically Signed   By: Elon Alas M.D.   On: 08/31/2015 01:14       Subjective: He is feeling better. No new complaints.   Discharge Exam: Filed Vitals:   09/10/15 2044 09/11/15 0419  BP: 145/96 140/87  Pulse: 95 83  Temp: 98.2 F (36.8 C) 98.7 F (37.1 C)  Resp: 18 16   Filed Vitals:   09/10/15 0621 09/10/15 1441 09/10/15 2044 09/11/15 0419  BP: 182/96 153/97 145/96 140/87  Pulse: 79 89 95 83  Temp: 98.1 F (36.7 C) 98.4 F (36.9 C) 98.2 F (36.8 C) 98.7 F (37.1 C)  TempSrc: Oral Oral Oral Oral  Resp: 18 17 18 16   Height:      Weight:      SpO2: 99% 99% 99% 99%    General: Pt is alert, awake, not in acute distress Cardiovascular: RRR, S1/S2 +, no rubs, no gallops Respiratory: CTA bilaterally, no wheezing, no rhonchi Abdominal: Soft, NT, ND, bowel sounds + Extremities: no edema, no cyanosis    The results of significant diagnostics from this hospitalization (including imaging, microbiology, ancillary and laboratory) are listed below for reference.     Microbiology: Recent Results (from the past 240 hour(s))  Aerobic/Anaerobic Culture (surgical/deep wound)     Status: None   Collection Time: 09/03/15 10:40 AM  Result Value Ref Range Status   Specimen Description ABSCESS RIGHT ARM  Final   Special Requests NONE  Final   Gram Stain   Final    MODERATE WBC PRESENT, PREDOMINANTLY PMN RARE SQUAMOUS EPITHELIAL CELLS PRESENT FEW GRAM POSITIVE COCCI IN CLUSTERS    Culture   Final    MODERATE METHICILLIN RESISTANT STAPHYLOCOCCUS AUREUS NO ANAEROBES ISOLATED Performed at [facility]    Report Status 09/08/2015 FINAL  Final   Organism ID, Bacteria METHICILLIN RESISTANT STAPHYLOCOCCUS AUREUS  Final      Susceptibility   Methicillin resistant staphylococcus aureus - MIC*    CIPROFLOXACIN <=0.5 SENSITIVE Sensitive     ERYTHROMYCIN >=8 RESISTANT Resistant     GENTAMICIN <=0.5 SENSITIVE Sensitive     OXACILLIN >=4 RESISTANT Resistant     TETRACYCLINE <=1 SENSITIVE Sensitive     VANCOMYCIN 1 SENSITIVE Sensitive     TRIMETH/SULFA <=10 SENSITIVE Sensitive     CLINDAMYCIN <=0.25 SENSITIVE Sensitive  RIFAMPIN <=0.5 SENSITIVE Sensitive     Inducible Clindamycin NEGATIVE Sensitive     * MODERATE METHICILLIN RESISTANT STAPHYLOCOCCUS AUREUS  Aerobic Culture (superficial specimen)     Status: None   Collection Time: 09/05/15 12:44 PM  Result Value Ref Range Status   Specimen Description WOUND RIGHT SHOULDER  Final   Special Requests NONE  Final   Gram Stain   Final    WBC PRESENT,BOTH PMN AND MONONUCLEAR ABUNDANT GRAM POSITIVE COCCI CRITICAL RESULT CALLED TO, READ BACK BY AND VERIFIED WITH: R.RIMANDO,RN 09/05/15 @2040  BY V.WILKINS CONFIRMED BY W.JOHNSON Performed at [facility]    Culture   Final    MODERATE METHICILLIN RESISTANT STAPHYLOCOCCUS AUREUS   Report Status 09/07/2015 FINAL  Final   Organism ID, Bacteria METHICILLIN RESISTANT STAPHYLOCOCCUS AUREUS  Final      Susceptibility   Methicillin resistant staphylococcus aureus - MIC*    CIPROFLOXACIN <=0.5 SENSITIVE Sensitive     ERYTHROMYCIN >=8 RESISTANT Resistant     GENTAMICIN <=0.5 SENSITIVE Sensitive     OXACILLIN >=4 RESISTANT Resistant     TETRACYCLINE <=1 SENSITIVE Sensitive     VANCOMYCIN 1 SENSITIVE Sensitive     TRIMETH/SULFA <=10 SENSITIVE Sensitive     CLINDAMYCIN <=0.25 SENSITIVE Sensitive     RIFAMPIN <=0.5 SENSITIVE Sensitive     Inducible Clindamycin NEGATIVE Sensitive     * MODERATE METHICILLIN RESISTANT STAPHYLOCOCCUS AUREUS     Labs: BNP (last 3 results) No results  for input(s): BNP in the last 8760 hours. Basic Metabolic Panel:  Recent Labs Lab 09/07/15 1144 09/08/15 0402 09/09/15 0432 09/10/15 0742 09/11/15 0758  NA 139 139 140 138 140  K 3.8 3.1* 3.6 3.4* 3.6  CL 104 104 105 103 105  CO2 28 28 28 28 29   GLUCOSE 183* 78 127* 89 90  BUN 6 8 11 14 18   CREATININE 1.66* 1.62* 1.70* 1.74* 1.61*  CALCIUM 8.9 8.5* 9.0 9.0 9.3   Liver Function Tests: No results for input(s): AST, ALT, ALKPHOS, BILITOT, PROT, ALBUMIN in the last 168 hours. No results for input(s): LIPASE, AMYLASE in the last 168 hours. No results for input(s): AMMONIA in the last 168 hours. CBC:  Recent Labs Lab 09/05/15 0429 09/06/15 0450 09/08/15 0402 09/09/15 0432  WBC 16.1* 11.8* 13.6* 12.1*  HGB 11.8* 12.9* 10.9* 11.0*  HCT 34.9* 37.7* 32.6* 32.4*  MCV 85.5 88.1 86.5 85.9  PLT 296 224 328 325   Cardiac Enzymes: No results for input(s): CKTOTAL, CKMB, CKMBINDEX, TROPONINI in the last 168 hours. BNP: Invalid input(s): POCBNP CBG:  Recent Labs Lab 09/10/15 0733 09/10/15 1204 09/10/15 1658 09/10/15 2207 09/11/15 0800  GLUCAP 86 219* 120* 205* 111*   D-Dimer No results for input(s): DDIMER in the last 72 hours. Hgb A1c No results for input(s): HGBA1C in the last 72 hours. Lipid Profile No results for input(s): CHOL, HDL, LDLCALC, TRIG, CHOLHDL, LDLDIRECT in the last 72 hours. Thyroid function studies No results for input(s): TSH, T4TOTAL, T3FREE, THYROIDAB in the last 72 hours.  Invalid input(s): FREET3 Anemia work up No results for input(s): VITAMINB12, FOLATE, FERRITIN, TIBC, IRON, RETICCTPCT in the last 72 hours. Urinalysis No results found for: COLORURINE, APPEARANCEUR, [place], [place], [place], [place], [place], [place], PROTEINUR, UROBILINOGEN, NITRITE, LEUKOCYTESUR Sepsis Labs Invalid input(s): PROCALCITONIN,  WBC,  LACTICIDVEN Microbiology Recent Results (from the past 240 hour(s))  Aerobic/Anaerobic Culture (surgical/deep wound)      Status: None   Collection Time: 09/03/15 10:40 AM  Result Value Ref Range Status  Specimen Description ABSCESS RIGHT ARM  Final   Special Requests NONE  Final   Gram Stain   Final    MODERATE WBC PRESENT, PREDOMINANTLY PMN RARE SQUAMOUS EPITHELIAL CELLS PRESENT FEW GRAM POSITIVE COCCI IN CLUSTERS    Culture   Final    MODERATE METHICILLIN RESISTANT STAPHYLOCOCCUS AUREUS NO ANAEROBES ISOLATED Performed at [facility]    Report Status 09/08/2015 FINAL  Final   Organism ID, Bacteria METHICILLIN RESISTANT STAPHYLOCOCCUS AUREUS  Final      Susceptibility   Methicillin resistant staphylococcus aureus - MIC*    CIPROFLOXACIN <=0.5 SENSITIVE Sensitive     ERYTHROMYCIN >=8 RESISTANT Resistant     GENTAMICIN <=0.5 SENSITIVE Sensitive     OXACILLIN >=4 RESISTANT Resistant     TETRACYCLINE <=1 SENSITIVE Sensitive     VANCOMYCIN 1 SENSITIVE Sensitive     TRIMETH/SULFA <=10 SENSITIVE Sensitive     CLINDAMYCIN <=0.25 SENSITIVE Sensitive     RIFAMPIN <=0.5 SENSITIVE Sensitive     Inducible Clindamycin NEGATIVE Sensitive     * MODERATE METHICILLIN RESISTANT STAPHYLOCOCCUS AUREUS  Aerobic Culture (superficial specimen)     Status: None   Collection Time: 09/05/15 12:44 PM  Result Value Ref Range Status   Specimen Description WOUND RIGHT SHOULDER  Final   Special Requests NONE  Final   Gram Stain   Final    WBC PRESENT,BOTH PMN AND MONONUCLEAR ABUNDANT GRAM POSITIVE COCCI CRITICAL RESULT CALLED TO, READ BACK BY AND VERIFIED WITH: R.RIMANDO,RN 09/05/15 @2040  BY V.WILKINS CONFIRMED BY W.JOHNSON Performed at [facility]    Culture   Final    MODERATE METHICILLIN RESISTANT STAPHYLOCOCCUS AUREUS   Report Status 09/07/2015 FINAL  Final   Organism ID, Bacteria METHICILLIN RESISTANT STAPHYLOCOCCUS AUREUS  Final      Susceptibility   Methicillin resistant staphylococcus aureus - MIC*    CIPROFLOXACIN <=0.5 SENSITIVE Sensitive     ERYTHROMYCIN >=8 RESISTANT Resistant      GENTAMICIN <=0.5 SENSITIVE Sensitive     OXACILLIN >=4 RESISTANT Resistant     TETRACYCLINE <=1 SENSITIVE Sensitive     VANCOMYCIN 1 SENSITIVE Sensitive     TRIMETH/SULFA <=10 SENSITIVE Sensitive     CLINDAMYCIN <=0.25 SENSITIVE Sensitive     RIFAMPIN <=0.5 SENSITIVE Sensitive     Inducible Clindamycin NEGATIVE Sensitive     * MODERATE METHICILLIN RESISTANT STAPHYLOCOCCUS AUREUS     Time coordinating discharge: Over 30 minutes  SIGNED:   Elmarie Shiley, MD  [facility] 09/11/2015, 9:12 AM Pager [phone]  If 7PM-7AM, please contact night-coverage [email or web address] Password [email or web address]

## 2015-09-11 NOTE — Clinical Social Work Note (Signed)
CSW followed up with pt regarding d/c plan. Pt's wife in room with his permission during discussion. He shared that they have worked out a plan for him to stay at his wife's home and she will go stay with someone else. Pt is hoping to avoid having to go to [place]. He requests home health for wound care. Pt asked about transport as he is d/c today and admits that he was given 2 bus passes already, but his wife has used them. Pt understands that he will need to find transportation. He also mentioned that a [employer] card was supposed to be given to him, but he has not received this. CSW left voicemail for CM regarding needs for d/c. RN updated.   [name] [name], [place]

## 2015-10-07 ENCOUNTER — Ambulatory Visit: Payer: Self-pay | Admitting: Family Medicine

## 2016-02-16 ENCOUNTER — Emergency Department (HOSPITAL_COMMUNITY)
Admission: EM | Admit: 2016-02-16 | Discharge: 2016-02-16 | Disposition: A | Discharge status: Home / Self Care | Payer: Self-pay | Attending: Emergency Medicine | Admitting: Emergency Medicine

## 2016-02-16 ENCOUNTER — Emergency Department (HOSPITAL_COMMUNITY): Payer: Self-pay

## 2016-02-16 ENCOUNTER — Encounter (HOSPITAL_COMMUNITY): Payer: Self-pay | Admitting: Emergency Medicine

## 2016-02-16 DIAGNOSIS — R933 Abnormal findings on diagnostic imaging of other parts of digestive tract: Secondary | ICD-10-CM | POA: Insufficient documentation

## 2016-02-16 DIAGNOSIS — L0501 Pilonidal cyst with abscess: Secondary | ICD-10-CM | POA: Insufficient documentation

## 2016-02-16 DIAGNOSIS — R739 Hyperglycemia, unspecified: Secondary | ICD-10-CM

## 2016-02-16 DIAGNOSIS — E1165 Type 2 diabetes mellitus with hyperglycemia: Secondary | ICD-10-CM | POA: Insufficient documentation

## 2016-02-16 DIAGNOSIS — I10 Essential (primary) hypertension: Secondary | ICD-10-CM | POA: Insufficient documentation

## 2016-02-16 DIAGNOSIS — Z794 Long term (current) use of insulin: Secondary | ICD-10-CM | POA: Insufficient documentation

## 2016-02-16 DIAGNOSIS — J45909 Unspecified asthma, uncomplicated: Secondary | ICD-10-CM | POA: Insufficient documentation

## 2016-02-16 LAB — COMPREHENSIVE METABOLIC PANEL
ALT: 33 U/L (ref 17–63)
ANION GAP: 13 (ref 5–15)
AST: 21 U/L (ref 15–41)
Albumin: 3.5 g/dL (ref 3.5–5.0)
Alkaline Phosphatase: 142 U/L — ABNORMAL HIGH (ref 38–126)
BUN: 13 mg/dL (ref 6–20)
CHLORIDE: 93 mmol/L — AB (ref 101–111)
CO2: 26 mmol/L (ref 22–32)
CREATININE: 0.86 mg/dL (ref 0.61–1.24)
Calcium: 9.1 mg/dL (ref 8.9–10.3)
Glucose, Bld: 349 mg/dL — ABNORMAL HIGH (ref 65–99)
Potassium: 3.6 mmol/L (ref 3.5–5.1)
SODIUM: 132 mmol/L — AB (ref 135–145)
Total Bilirubin: 1 mg/dL (ref 0.3–1.2)
Total Protein: 7.3 g/dL (ref 6.5–8.1)

## 2016-02-16 LAB — CBC
HCT: 35.8 % — ABNORMAL LOW (ref 39.0–52.0)
HEMOGLOBIN: 12.9 g/dL — AB (ref 13.0–17.0)
MCH: 30.4 pg (ref 26.0–34.0)
MCHC: 36 g/dL (ref 30.0–36.0)
MCV: 84.4 fL (ref 78.0–100.0)
PLATELETS: 291 10*3/uL (ref 150–400)
RBC: 4.24 MIL/uL (ref 4.22–5.81)
RDW: 11.9 % (ref 11.5–15.5)
WBC: 18.6 10*3/uL — AB (ref 4.0–10.5)

## 2016-02-16 LAB — I-STAT CG4 LACTIC ACID, ED: LACTIC ACID, VENOUS: 1.43 mmol/L (ref 0.5–1.9)

## 2016-02-16 LAB — LIPASE, BLOOD: LIPASE: 16 U/L (ref 11–51)

## 2016-02-16 LAB — CBG MONITORING, ED: GLUCOSE-CAPILLARY: 360 mg/dL — AB (ref 65–99)

## 2016-02-16 MED ORDER — ONDANSETRON 4 MG PO TBDP: 4.0000 mg | ORAL_TABLET | Freq: Three times a day (TID) | ORAL | 0 refills | Status: DC | PRN

## 2016-02-16 MED ORDER — IOPAMIDOL (ISOVUE-300) INJECTION 61%
INTRAVENOUS | Status: AC
  Filled 2016-02-16: qty 100

## 2016-02-16 MED ORDER — SULFAMETHOXAZOLE-TRIMETHOPRIM 800-160 MG PO TABS
1.0000 | ORAL_TABLET | Freq: Once | ORAL | Status: AC
  Administered 2016-02-16: 1 via ORAL
  Filled 2016-02-16: qty 1

## 2016-02-16 MED ORDER — METFORMIN HCL 500 MG PO TABS: 500.0000 mg | ORAL_TABLET | Freq: Two times a day (BID) | ORAL | 0 refills | Status: DC

## 2016-02-16 MED ORDER — SULFAMETHOXAZOLE-TRIMETHOPRIM 800-160 MG PO TABS: 1.0000 | ORAL_TABLET | Freq: Two times a day (BID) | ORAL | 0 refills | Status: DC

## 2016-02-16 MED ORDER — MORPHINE SULFATE (PF) 4 MG/ML IV SOLN
4.0000 mg | Freq: Once | INTRAVENOUS | Status: AC
Start: 2016-02-16 — End: 2016-02-16
  Administered 2016-02-16: 4 mg via INTRAVENOUS
  Filled 2016-02-16: qty 1

## 2016-02-16 MED ORDER — NAPROXEN 500 MG PO TABS: 500.0000 mg | ORAL_TABLET | Freq: Two times a day (BID) | ORAL | 0 refills | Status: DC

## 2016-02-16 MED ORDER — SODIUM CHLORIDE 0.9 % IV BOLUS (SEPSIS)
1000.0000 mL | Freq: Once | INTRAVENOUS | Status: AC
  Administered 2016-02-16: 1000 mL via INTRAVENOUS

## 2016-02-16 MED ORDER — METFORMIN HCL 500 MG PO TABS
500.0000 mg | ORAL_TABLET | Freq: Once | ORAL | Status: AC
  Administered 2016-02-16: 500 mg via ORAL
  Filled 2016-02-16: qty 1

## 2016-02-16 MED ORDER — IOPAMIDOL (ISOVUE-300) INJECTION 61%
100.0000 mL | Freq: Once | INTRAVENOUS | Status: AC | PRN
  Administered 2016-02-16: 100 mL via INTRAVENOUS

## 2016-02-16 MED ORDER — HYDROCODONE-ACETAMINOPHEN 5-325 MG PO TABS: 1.0000 | ORAL_TABLET | Freq: Four times a day (QID) | ORAL | 0 refills | Status: DC | PRN

## 2016-02-16 NOTE — ED Triage Notes (Signed)
Per EMS pt from home. C/o green boil on tailbone that his wife popped x2 days ago. Pt reports he's been trying to keep clean however pus has been coming out. Vitals Stable. NAD noted.

## 2016-02-16 NOTE — ED Provider Notes (Signed)
[facility] DEPT Provider Note   CSN: [id number] Arrival date & time: 02/16/16  1812 By signing my name below, I, [name] [name], attest that this documentation has been prepared under the direction and in the presence of non-physician practitioner, Arlean Hopping, PA-C. Electronically Signed: Dyke [name], Scribe. 02/16/2016. 8:54 PM.   History   Chief Complaint Chief Complaint  Patient presents with  . Abscess   HPI  [name] [name] is a 30 y.o. male with a hx of abscess, cellulitis, DM and MRSA who presents to the Emergency Department complaining of a gradually worsening, moderately painful lesion to his lower back onset 2 weeks ago. Pt rates his pain as a 7/10 and states his pain is exacerbated by movement. He endorses draining from the area which began after wife "popped" the lesion yesterday. He has taken tylenol with no relief of the pain, last dose yesterday. He states that the infection "feels deep like it's touching my backbone."  He notes associated decreased appetite, nausea, vomiting 3x which began yesterday, diarrhea, and subjective fever. No recent abx use. Pt denies any pain with bowel movement, abdominal pain, urinary symptoms, fever/chills, or any other complaints.   The history is provided by the patient. No language interpreter was used.    Past Medical History:  Diagnosis Date  . Asthma   . Diabetes mellitus without complication ([place])   . Hypertension     Patient Active Problem List   Diagnosis Date Noted  . AKI (acute kidney injury) ([place]) 09/09/2015  . Abscess and cellulitis   . Abscess of right shoulder 09/06/2015  . Normocytic anemia 09/06/2015  . Obesity, morbid ([place]) 09/06/2015  . Hypertension 09/06/2015  . Recurrent boils 09/06/2015  . MRSA (methicillin resistant staph aureus) culture positive 09/04/2015  . Abscess of left shoulder 08/31/2015  . Uncontrolled diabetes mellitus ([place]) 08/31/2015  . Abscess of right arm 08/31/2015    Past Surgical  History:  Procedure Laterality Date  . HERNIA REPAIR     x2 when he was a baby  . INCISION AND DRAINAGE ABSCESS Right 09/03/2015   Procedure: INCISION AND DRAINAGE ABSCESS;  Surgeon: Roseanne Kaufman, MD;  Location: [facility] ORS;  Service: Orthopedics;  Laterality: Right;  . IRRIGATION AND DEBRIDEMENT ABSCESS Left 08/31/2015   Procedure: IRRIGATION AND DEBRIDEMENT ABSCESS LEFT SHOULDER;  Surgeon: Alphonsa Overall, MD;  Location: [facility];  Service: General;  Laterality: Left;      Home Medications    Prior to Admission medications   Medication Sig Start Date End Date Taking? Authorizing Provider  acetaminophen (TYLENOL) 325 MG tablet Take 2 tablets (650 mg total) by mouth every 6 (six) hours as needed for mild pain (or temp > 100). Patient not taking: Reported on 02/16/2016 09/11/15   Belkys A Regalado, MD  amLODipine (NORVASC) 5 MG tablet Take 1 tablet (5 mg total) by mouth daily. Patient not taking: Reported on 02/16/2016 09/11/15   Belkys A Regalado, MD  doxycycline (VIBRA-TABS) 100 MG tablet Take 1 tablet (100 mg total) by mouth every 12 (twelve) hours. Patient not taking: Reported on 02/16/2016 09/11/15   Belkys A Regalado, MD  HYDROcodone-acetaminophen (NORCO/VICODIN) 5-325 MG tablet Take 1 tablet by mouth every 6 (six) hours as needed. 02/16/16   Shawn C Joy, PA-C  insulin glargine (LANTUS) 100 UNIT/ML injection Inject 0.35 mLs (35 Units total) into the skin 2 (two) times daily. Patient not taking: Reported on 02/16/2016 09/11/15   Belkys A Regalado, MD  Insulin Syringe 27G X 1/2" 1 ML MISC 1  application by Does not apply route 2 (two) times daily. 09/11/15   Belkys A Regalado, MD  metFORMIN (GLUCOPHAGE) 500 MG tablet Take 1 tablet (500 mg total) by mouth 2 (two) times daily with a meal. 02/16/16 03/17/16  Shawn C Joy, PA-C  naproxen (NAPROSYN) 500 MG tablet Take 1 tablet (500 mg total) by mouth 2 (two) times daily. 02/16/16   Shawn C Joy, PA-C  nutrition supplement, JUVEN, (JUVEN) PACK Take 1 packet  by mouth 2 (two) times daily between meals. Patient not taking: Reported on 02/16/2016 09/11/15   Belkys A Regalado, MD  ondansetron (ZOFRAN ODT) 4 MG disintegrating tablet Take 1 tablet (4 mg total) by mouth every 8 (eight) hours as needed for nausea or vomiting. 02/16/16   Shawn C Joy, PA-C  sulfamethoxazole-trimethoprim (BACTRIM DS,SEPTRA DS) 800-160 MG tablet Take 1 tablet by mouth 2 (two) times daily. 02/16/16 02/23/16  Lorayne Bender, PA-C    Family History History reviewed. No pertinent family history.  Social History Social History  Substance Use Topics  . Smoking status: Never Smoker  . Smokeless tobacco: Never Used  . Alcohol use No    Allergies   Patient has no known allergies.  Review of Systems Review of Systems  Constitutional: Positive for appetite change. Negative for chills, diaphoresis and fever.  Gastrointestinal: Positive for diarrhea, nausea and vomiting. Negative for abdominal pain.  Genitourinary: Negative for difficulty urinating and dysuria.  Skin: Positive for wound.  All other systems reviewed and are negative.   Physical Exam Updated Vital Signs BP 144/88 (BP Location: Left Arm)   Pulse 114   Temp 99.5 F (37.5 C) (Oral)   Resp 18   Ht 5\' 7"  (1.702 m)   Wt 210 lb (95.3 kg)   SpO2 98%   BMI 32.89 kg/m   Physical Exam  Constitutional: He appears well-developed and well-nourished. No distress.  HENT:  Head: Normocephalic and atraumatic.  Eyes: Conjunctivae are normal.  Neck: Neck supple.  Cardiovascular: Normal rate, regular rhythm, normal heart sounds and intact distal pulses.   Pulmonary/Chest: Effort normal and breath sounds normal. No respiratory distress.  Abdominal: Soft. There is no tenderness. There is no guarding.  Musculoskeletal: He exhibits no edema.  Lymphadenopathy:    He has no cervical adenopathy.  Neurological: He is alert.  Skin: Skin is warm and dry. He is not diaphoretic.  Ruptured abscess at the sacral cleft; surrounding  area of erythema, induration and tenderness measuring about  5x 4 cm. Tenderness and induration extends out from the area. Area of induration that surrounds the erythema is about 8 x 12 cm.   Psychiatric: He has a normal mood and affect. His behavior is normal.  Nursing note and vitals reviewed.  ED Treatments / Results  DIAGNOSTIC STUDIES:  Oxygen Saturation is 98% on RA, normal by my interpretation.    COORDINATION OF CARE:  8:49 PM Discussed treatment plan with pt at bedside and pt agreed to plan.   Labs (all labs ordered are listed, but only abnormal results are displayed) Labs Reviewed  COMPREHENSIVE METABOLIC PANEL - Abnormal; Notable for the following:       Result Value   Sodium 132 (*)    Chloride 93 (*)    Glucose, Bld 349 (*)    Alkaline Phosphatase 142 (*)    All other components within normal limits  CBC - Abnormal; Notable for the following:    WBC 18.6 (*)    Hemoglobin 12.9 (*)  HCT 35.8 (*)    All other components within normal limits  CBG MONITORING, ED - Abnormal; Notable for the following:    Glucose-Capillary 360 (*)    All other components within normal limits  CULTURE, BLOOD (ROUTINE X 2)  CULTURE, BLOOD (ROUTINE X 2)  LIPASE, BLOOD  I-STAT CG4 LACTIC ACID, ED    EKG  EKG Interpretation None       Radiology Ct Abdomen Pelvis W Contrast  Result Date: 02/16/2016 CLINICAL DATA:  Pain bold bold over the tail bone bold bold with onset 2 weeks ago. EXAM: CT ABDOMEN AND PELVIS WITH CONTRAST TECHNIQUE: Multidetector CT imaging of the abdomen and pelvis was performed using the standard protocol following bolus administration of intravenous contrast. CONTRAST:  187mL ISOVUE-300 IOPAMIDOL (ISOVUE-300) INJECTION 61% COMPARISON:  None. FINDINGS: LOWER CHEST: Lung bases are clear. Included heart size is normal. No pericardial effusion. HEPATOBILIARY: Liver and gallbladder are normal. PANCREAS: Normal. SPLEEN: Normal.  Small adjacent splenules are present.  ADRENALS/URINARY TRACT: Kidneys are orthotopic, demonstrating symmetric enhancement. No nephrolithiasis, hydronephrosis or solid renal masses. The unopacified ureters are normal in course and caliber. Urinary bladder is partially distended and unremarkable. Normal adrenal glands. STOMACH/BOWEL: The stomach, small and large bowel are normal in course and caliber without inflammatory changes. Normal appendix. VASCULAR/LYMPHATIC: Aortoiliac vessels are normal in course and caliber. No lymphadenopathy by CT size criteria. REPRODUCTIVE: Normal. OTHER: No intraperitoneal free fluid or free air. MUSCULOSKELETAL: There is a small abscess with tiny focus of air along the upper portion of the natal cleft consistent with a pilonidal abscess. Margins are somewhat indistinct but estimated at 3.2 cm in diameter. Adjacent cellulitic soft tissue fatty induration is seen extending over the gluteal muscles bilaterally. No bone destruction is seen. IMPRESSION: 3.2 cm soft tissue abscess along the upper portion of the natal cleft consistent with a pilonidal abscess. No underlying bony involvement noted. Subcutaneous fatty inflammation and edema noted adjacent to this finding bilaterally. Electronically Signed   By: Ashley Royalty M.D.   On: 02/16/2016 21:39    Procedures Procedures (including critical care time)  Medications Ordered in ED Medications  sodium chloride 0.9 % bolus 1,000 mL (0 mLs Intravenous Stopped 02/16/16 2233)  morphine 4 MG/ML injection 4 mg (4 mg Intravenous Given 02/16/16 2147)  iopamidol (ISOVUE-300) 61 % injection 100 mL (100 mLs Intravenous Contrast Given 02/16/16 2118)  sulfamethoxazole-trimethoprim (BACTRIM DS,SEPTRA DS) 800-160 MG per tablet 1 tablet (1 tablet Oral Given 02/16/16 2220)  metFORMIN (GLUCOPHAGE) tablet 500 mg (500 mg Oral Given 02/16/16 2233)     Initial Impression / Assessment and Plan / ED Course  I have reviewed the triage vital signs and the nursing notes.  Pertinent labs &  imaging results that were available during my care of the patient were reviewed by me and considered in my medical decision making (see chart for details).  Clinical Course     Patient presents with an abscess at the sacral cleft. Abscess is already draining, thus no I&D is necessary at this time. Patient CT scan shows no muscular or bony involvement of the abscess. Patient is nontoxic appearing, afebrile, not tachypneic, not hypotensive, and is in no apparent distress. I do not think that this patient is septic and appears to be appropriate for outpatient therapy at this time.   PCP follow-up for wound check and management of the patient's diabetes. Social work and case management consults were placed to assist the patient in selecting a PCP.  Findings and plan  of care discussed with Margette Fast, MD.    Vitals:   02/16/16 1818 02/16/16 1836 02/16/16 1838 02/16/16 2237  BP:  144/88  112/83  Pulse:  114  110  Resp:  18  18  Temp:  99.5 F (37.5 C)    TempSrc:  Oral    SpO2: 98% 98%  99%  Weight:   95.3 kg   Height:   5\' 7"  (1.702 m)      Final Clinical Impressions(s) / ED Diagnoses   Final diagnoses:  Pilonidal abscess  Hyperglycemia    New Prescriptions Discharge Medication List as of 02/16/2016 10:29 PM    START taking these medications   Details  HYDROcodone-acetaminophen (NORCO/VICODIN) 5-325 MG tablet Take 1 tablet by mouth every 6 (six) hours as needed., Starting Thu 02/16/2016, Print    metFORMIN (GLUCOPHAGE) 500 MG tablet Take 1 tablet (500 mg total) by mouth 2 (two) times daily with a meal., Starting Thu 02/16/2016, Until Sat 03/17/2016, Print    naproxen (NAPROSYN) 500 MG tablet Take 1 tablet (500 mg total) by mouth 2 (two) times daily., Starting Thu 02/16/2016, Print    ondansetron (ZOFRAN ODT) 4 MG disintegrating tablet Take 1 tablet (4 mg total) by mouth every 8 (eight) hours as needed for nausea or vomiting., Starting Thu 02/16/2016, Print      sulfamethoxazole-trimethoprim (BACTRIM DS,SEPTRA DS) 800-160 MG tablet Take 1 tablet by mouth 2 (two) times daily., Starting Thu 02/16/2016, Until Thu 02/23/2016, Print       I personally performed the services described in this documentation, which was scribed in my presence. The recorded information has been reviewed and is accurate.   Lorayne Bender, PA-C 02/17/16 [place], MD 02/17/16 [phone]

## 2016-02-16 NOTE — ED Notes (Signed)
Bed: LK44 Expected date:  Expected time:  Means of arrival:  Comments: TRIAGE 6

## 2016-02-16 NOTE — ED Notes (Signed)
CBG 360  

## 2016-02-16 NOTE — Discharge Instructions (Addendum)
You were seen today for an abscess. Since this is currently draining, no incision and drainage is necessary at this time.  Please take all of your antibiotics until finished!   You may develop abdominal discomfort or diarrhea from the antibiotic.  You may help offset this with probiotics which you can buy or get in yogurt. Do not eat or take the probiotics until 2 hours after your antibiotic. Start taking the prescribed antibiotics tomorrow morning. Ibuprofen, naproxen, or Tylenol for pain. Vicodin for severe pain. Do not drive or perform other dangerous activities will taking the vicodin. Zofran for nausea.  You were also found to be hyperglycemic. Follow up with a primary care provider as soon as possible. Resources have been given to you by the Education officer, museum. Start taking the metformin tomorrow morning.

## 2016-02-16 NOTE — ED Notes (Signed)
Pts wife states pt has been having NVD x3 days ago. Pt endorses not being able to eat. No nausea at this time.

## 2016-02-16 NOTE — Progress Notes (Signed)
EDCM spoke to patient at bedside. Patient confirms he does not have a pcp or insurance living in [place].  [facility] provided patient with contact information to [facility], informed patient of services there.  EDCM also provided patient with list of pcps who accept self pay patients, list of discount pharmacies and websites needymeds.org and GoodRX.com for medication assistance, phone number to inquire about the orange card, phone number to inquire about Medicaid, phone number to inquire about the [place], financial resources in the community such as local churches, [employer], urban ministries, and dental assistance for uninsured patients.  Patient thankful for resources.  No further EDCM needs at this time.  [facility] provided patient with contact information for the [facility].  Patient is aware of the [facility].  He reports they were assisting him in getting his orange card.  Patient reports he has been meaning to get there.  Patient has already been assisted with [facility] program for medication assistance this year.

## 2016-02-20 ENCOUNTER — Telehealth: Payer: Self-pay | Admitting: General Practice

## 2016-02-21 LAB — CULTURE, BLOOD (ROUTINE X 2)
CULTURE: NO GROWTH
CULTURE: NO GROWTH

## 2016-02-22 ENCOUNTER — Inpatient Hospital Stay (HOSPITAL_COMMUNITY)
Admission: EM | Admit: 2016-02-22 | Discharge: 2016-02-29 | DRG: 853 | Disposition: A | Discharge status: Home Health | Payer: Self-pay | Attending: Internal Medicine | Admitting: Internal Medicine

## 2016-02-22 ENCOUNTER — Encounter (HOSPITAL_COMMUNITY): Payer: Self-pay | Admitting: *Deleted

## 2016-02-22 ENCOUNTER — Emergency Department (HOSPITAL_COMMUNITY): Payer: Self-pay

## 2016-02-22 ENCOUNTER — Inpatient Hospital Stay (HOSPITAL_COMMUNITY): Payer: Self-pay

## 2016-02-22 DIAGNOSIS — E1159 Type 2 diabetes mellitus with other circulatory complications: Secondary | ICD-10-CM | POA: Diagnosis present

## 2016-02-22 DIAGNOSIS — I10 Essential (primary) hypertension: Secondary | ICD-10-CM | POA: Diagnosis present

## 2016-02-22 DIAGNOSIS — J45909 Unspecified asthma, uncomplicated: Secondary | ICD-10-CM | POA: Diagnosis present

## 2016-02-22 DIAGNOSIS — A401 Sepsis due to streptococcus, group B: Secondary | ICD-10-CM

## 2016-02-22 DIAGNOSIS — A409 Streptococcal sepsis, unspecified: Principal | ICD-10-CM | POA: Diagnosis present

## 2016-02-22 DIAGNOSIS — B952 Enterococcus as the cause of diseases classified elsewhere: Secondary | ICD-10-CM

## 2016-02-22 DIAGNOSIS — Z8614 Personal history of Methicillin resistant Staphylococcus aureus infection: Secondary | ICD-10-CM

## 2016-02-22 DIAGNOSIS — E860 Dehydration: Secondary | ICD-10-CM | POA: Diagnosis present

## 2016-02-22 DIAGNOSIS — E118 Type 2 diabetes mellitus with unspecified complications: Secondary | ICD-10-CM

## 2016-02-22 DIAGNOSIS — D638 Anemia in other chronic diseases classified elsewhere: Secondary | ICD-10-CM | POA: Diagnosis present

## 2016-02-22 DIAGNOSIS — E1165 Type 2 diabetes mellitus with hyperglycemia: Secondary | ICD-10-CM

## 2016-02-22 DIAGNOSIS — I152 Hypertension secondary to endocrine disorders: Secondary | ICD-10-CM | POA: Diagnosis present

## 2016-02-22 DIAGNOSIS — L0591 Pilonidal cyst without abscess: Secondary | ICD-10-CM | POA: Diagnosis present

## 2016-02-22 DIAGNOSIS — A419 Sepsis, unspecified organism: Secondary | ICD-10-CM | POA: Diagnosis present

## 2016-02-22 DIAGNOSIS — R6520 Severe sepsis without septic shock: Secondary | ICD-10-CM | POA: Diagnosis present

## 2016-02-22 DIAGNOSIS — A498 Other bacterial infections of unspecified site: Secondary | ICD-10-CM

## 2016-02-22 DIAGNOSIS — N179 Acute kidney failure, unspecified: Secondary | ICD-10-CM | POA: Diagnosis present

## 2016-02-22 DIAGNOSIS — Z79899 Other long term (current) drug therapy: Secondary | ICD-10-CM

## 2016-02-22 DIAGNOSIS — Z794 Long term (current) use of insulin: Secondary | ICD-10-CM

## 2016-02-22 DIAGNOSIS — L0390 Cellulitis, unspecified: Secondary | ICD-10-CM | POA: Diagnosis present

## 2016-02-22 DIAGNOSIS — L03312 Cellulitis of back [any part except buttock]: Secondary | ICD-10-CM | POA: Diagnosis present

## 2016-02-22 DIAGNOSIS — E1110 Type 2 diabetes mellitus with ketoacidosis without coma: Secondary | ICD-10-CM | POA: Diagnosis present

## 2016-02-22 DIAGNOSIS — D649 Anemia, unspecified: Secondary | ICD-10-CM | POA: Diagnosis present

## 2016-02-22 DIAGNOSIS — E871 Hypo-osmolality and hyponatremia: Secondary | ICD-10-CM | POA: Diagnosis present

## 2016-02-22 DIAGNOSIS — Z9114 Patient's other noncompliance with medication regimen: Secondary | ICD-10-CM

## 2016-02-22 DIAGNOSIS — I959 Hypotension, unspecified: Secondary | ICD-10-CM

## 2016-02-22 DIAGNOSIS — E876 Hypokalemia: Secondary | ICD-10-CM | POA: Diagnosis not present

## 2016-02-22 DIAGNOSIS — IMO0002 Reserved for concepts with insufficient information to code with codable children: Secondary | ICD-10-CM | POA: Diagnosis present

## 2016-02-22 LAB — URINALYSIS, ROUTINE W REFLEX MICROSCOPIC
Bacteria, UA: NONE SEEN
Bilirubin Urine: NEGATIVE
Hgb urine dipstick: NEGATIVE
KETONES UR: 20 mg/dL — AB
Leukocytes, UA: NEGATIVE
Nitrite: NEGATIVE
PH: 5 (ref 5.0–8.0)
Protein, ur: NEGATIVE mg/dL
RBC / HPF: NONE SEEN RBC/hpf (ref 0–5)
SQUAMOUS EPITHELIAL / LPF: NONE SEEN
Specific Gravity, Urine: 1.03 (ref 1.005–1.030)

## 2016-02-22 LAB — CBC
HCT: 35.6 % — ABNORMAL LOW (ref 39.0–52.0)
Hemoglobin: 12.7 g/dL — ABNORMAL LOW (ref 13.0–17.0)
MCH: 29.8 pg (ref 26.0–34.0)
MCHC: 35.7 g/dL (ref 30.0–36.0)
MCV: 83.6 fL (ref 78.0–100.0)
PLATELETS: 394 10*3/uL (ref 150–400)
RBC: 4.26 MIL/uL (ref 4.22–5.81)
RDW: 12.1 % (ref 11.5–15.5)
WBC: 36.1 10*3/uL — ABNORMAL HIGH (ref 4.0–10.5)

## 2016-02-22 LAB — I-STAT CG4 LACTIC ACID, ED
LACTIC ACID, VENOUS: 2.27 mmol/L — AB (ref 0.5–1.9)
Lactic Acid, Venous: 2.5 mmol/L (ref 0.5–1.9)

## 2016-02-22 LAB — BASIC METABOLIC PANEL
Anion gap: 18 — ABNORMAL HIGH (ref 5–15)
BUN: 19 mg/dL (ref 6–20)
CALCIUM: 9.4 mg/dL (ref 8.9–10.3)
CHLORIDE: 88 mmol/L — AB (ref 101–111)
CO2: 19 mmol/L — AB (ref 22–32)
CREATININE: 1.29 mg/dL — AB (ref 0.61–1.24)
GFR calc non Af Amer: >60 mL/min (ref >60)
GLUCOSE: 504 mg/dL — AB (ref 65–99)
Potassium: 4 mmol/L (ref 3.5–5.1)
Sodium: 125 mmol/L — ABNORMAL LOW (ref 135–145)

## 2016-02-22 LAB — I-STAT VENOUS BLOOD GAS, ED
Acid-base deficit: 3 mmol/L — ABNORMAL HIGH (ref 0.0–2.0)
Bicarbonate: 20.5 mmol/L (ref 20.0–28.0)
O2 Saturation: 92 %
PCO2 VEN: 30.4 mmHg — AB (ref 44.0–60.0)
PH VEN: 7.437 — AB (ref 7.250–7.430)
TCO2: 21 mmol/L (ref 0–100)
pO2, Ven: 61 mmHg — ABNORMAL HIGH (ref 32.0–45.0)

## 2016-02-22 LAB — CBG MONITORING, ED
GLUCOSE-CAPILLARY: 461 mg/dL — AB (ref 65–99)
GLUCOSE-CAPILLARY: 404 mg/dL — AB (ref 65–99)
GLUCOSE-CAPILLARY: 370 mg/dL — AB (ref 65–99)
GLUCOSE-CAPILLARY: 300 mg/dL — AB (ref 65–99)
Glucose-Capillary: 389 mg/dL — ABNORMAL HIGH (ref 65–99)

## 2016-02-22 MED ORDER — POTASSIUM CHLORIDE CRYS ER 20 MEQ PO TBCR
20.0000 meq | EXTENDED_RELEASE_TABLET | Freq: Once | ORAL | Status: AC
  Administered 2016-02-22: 20 meq via ORAL
  Filled 2016-02-22: qty 1

## 2016-02-22 MED ORDER — PIPERACILLIN-TAZOBACTAM 3.375 G IVPB
3.3750 g | Freq: Three times a day (TID) | INTRAVENOUS | Status: DC
  Administered 2016-02-23 – 2016-02-28 (×16): 3.375 g via INTRAVENOUS
  Filled 2016-02-22 (×20): qty 50

## 2016-02-22 MED ORDER — TETANUS-DIPHTH-ACELL PERTUSSIS 5-2.5-18.5 LF-MCG/0.5 IM SUSP
0.5000 mL | Freq: Once | INTRAMUSCULAR | Status: AC
  Administered 2016-02-22: 0.5 mL via INTRAMUSCULAR
  Filled 2016-02-22: qty 0.5

## 2016-02-22 MED ORDER — VANCOMYCIN HCL IN DEXTROSE 1-5 GM/200ML-% IV SOLN
1000.0000 mg | Freq: Three times a day (TID) | INTRAVENOUS | Status: DC
  Administered 2016-02-23 – 2016-02-24 (×6): 1000 mg via INTRAVENOUS
  Filled 2016-02-22 (×8): qty 200

## 2016-02-22 MED ORDER — DEXTROSE-NACL 5-0.45 % IV SOLN: INTRAVENOUS | Status: DC

## 2016-02-22 MED ORDER — VANCOMYCIN HCL IN DEXTROSE 1-5 GM/200ML-% IV SOLN
1000.0000 mg | Freq: Once | INTRAVENOUS | Status: AC
  Administered 2016-02-22: 1000 mg via INTRAVENOUS
  Filled 2016-02-22: qty 200

## 2016-02-22 MED ORDER — HYDROCODONE-ACETAMINOPHEN 5-325 MG PO TABS
1.0000 | ORAL_TABLET | Freq: Four times a day (QID) | ORAL | Status: DC | PRN
  Administered 2016-02-22 – 2016-02-29 (×18): 2 via ORAL
  Filled 2016-02-22 (×18): qty 2

## 2016-02-22 MED ORDER — SODIUM CHLORIDE 0.9 % IV BOLUS (SEPSIS)
1000.0000 mL | Freq: Once | INTRAVENOUS | Status: AC
  Administered 2016-02-22: 1000 mL via INTRAVENOUS

## 2016-02-22 MED ORDER — PIPERACILLIN-TAZOBACTAM 3.375 G IVPB 30 MIN
3.3750 g | Freq: Once | INTRAVENOUS | Status: AC
  Administered 2016-02-22: 3.375 g via INTRAVENOUS
  Filled 2016-02-22: qty 50

## 2016-02-22 MED ORDER — SODIUM CHLORIDE 0.9 % IV BOLUS (SEPSIS)
1000.0000 mL | Freq: Once | INTRAVENOUS | Status: AC
Start: 2016-02-22 — End: 2016-02-22
  Administered 2016-02-22: 1000 mL via INTRAVENOUS

## 2016-02-22 MED ORDER — INSULIN REGULAR HUMAN 100 UNIT/ML IJ SOLN
INTRAMUSCULAR | Status: DC
  Administered 2016-02-22: 4 [IU]/h via INTRAVENOUS
  Filled 2016-02-22: qty 2.5

## 2016-02-22 MED ORDER — CEFAZOLIN IN D5W 1 GM/50ML IV SOLN
1.0000 g | Freq: Once | INTRAVENOUS | Status: AC
  Administered 2016-02-22: 1 g via INTRAVENOUS
  Filled 2016-02-22: qty 50

## 2016-02-22 MED ORDER — FENTANYL CITRATE (PF) 100 MCG/2ML IJ SOLN
50.0000 ug | Freq: Once | INTRAMUSCULAR | Status: AC
  Administered 2016-02-22: 50 ug via INTRAVENOUS
  Filled 2016-02-22: qty 2

## 2016-02-22 MED ORDER — SODIUM CHLORIDE 0.9 % IV SOLN
INTRAVENOUS | Status: DC
  Administered 2016-02-22: via INTRAVENOUS

## 2016-02-22 MED ORDER — CLINDAMYCIN PHOSPHATE 600 MG/50ML IV SOLN
600.0000 mg | Freq: Once | INTRAVENOUS | Status: AC
  Administered 2016-02-22: 600 mg via INTRAVENOUS
  Filled 2016-02-22: qty 50

## 2016-02-22 MED ORDER — OXYCODONE-ACETAMINOPHEN 5-325 MG PO TABS
1.0000 | ORAL_TABLET | ORAL | Status: DC | PRN
  Administered 2016-02-22: 1 via ORAL

## 2016-02-22 MED ORDER — SODIUM CHLORIDE 0.9 % IV SOLN
INTRAVENOUS | Status: DC
  Administered 2016-02-22: 12 [IU]/h via INTRAVENOUS
  Filled 2016-02-22: qty 2.5

## 2016-02-22 MED ORDER — ACETAMINOPHEN 325 MG PO TABS: 650.0000 mg | ORAL_TABLET | Freq: Four times a day (QID) | ORAL | Status: DC | PRN

## 2016-02-22 MED ORDER — OXYCODONE-ACETAMINOPHEN 5-325 MG PO TABS
ORAL_TABLET | ORAL | Status: AC
  Filled 2016-02-22: qty 1

## 2016-02-22 MED ORDER — IOPAMIDOL (ISOVUE-300) INJECTION 61%
INTRAVENOUS | Status: AC
  Administered 2016-02-22: 100 mL
  Filled 2016-02-22: qty 100

## 2016-02-22 MED ORDER — DEXTROSE-NACL 5-0.45 % IV SOLN
INTRAVENOUS | Status: DC
  Administered 2016-02-23: via INTRAVENOUS

## 2016-02-22 MED ORDER — ENOXAPARIN SODIUM 40 MG/0.4ML ~~LOC~~ SOLN
40.0000 mg | SUBCUTANEOUS | Status: DC
  Administered 2016-02-23 – 2016-02-29 (×6): 40 mg via SUBCUTANEOUS
  Filled 2016-02-22 (×7): qty 0.4

## 2016-02-22 NOTE — Progress Notes (Signed)
Pharmacy Antibiotic Note  [name] [name] is a 30 y.o. male admitted on 02/22/2016 with sepsis and cellulitis.  Pharmacy has been consulted for vancomycin and zosyn dosing.  Plan: Zosyn 3.375 gm iv q8h Vancomycin 1 g q8h Monitor renal fx, cx, vt prn     Temp (24hrs), Avg:97.7 F (36.5 C), Min:97.7 F (36.5 C), Max:97.7 F (36.5 C)   Recent Labs Lab 02/16/16 2000 02/16/16 2145 02/22/16 1301 02/22/16 1606 02/22/16 2054  WBC 18.6*  --  36.1*  --   --   CREATININE 0.86  --  1.29*  --   --   LATICACIDVEN  --  1.43  --  2.50* 2.27*    Estimated Creatinine Clearance: 93 mL/min (by C-G formula based on SCr of 1.29 mg/dL (H)).    No Known Allergies  Levester Fresh, PharmD, BCPS, BCCCP Clinical Pharmacist 02/22/2016 10:38 PM

## 2016-02-22 NOTE — ED Provider Notes (Signed)
[facility] DEPT Provider Note   CSN: [id number] Arrival date & time: 02/22/16  1230     History   Chief Complaint Chief Complaint  Patient presents with  . Back Pain  . Fatigue    HPI [name] [name] is a 30 y.o. male.comPlains of low back for the past 3 weeks pain not radiating. Pain is worse with lying supine and improved with not placing pressure over the area. He's become generally weak and fatigued over the past one week. Admits to increased thirst. Denies any vomiting. No other associated symptoms. No chest pain no shortness of breath no abdominal pain nothing makes symptoms better or worse Patient seen in the emergency department for same complaint 02/16/2016, prescribed Norco and Bactrim which she's taken and continues to feel worse.  HPI  Past Medical History:  Diagnosis Date  . Asthma   . Diabetes mellitus without complication ([place])   . Hypertension     Patient Active Problem List   Diagnosis Date Noted  . AKI (acute kidney injury) ([place]) 09/09/2015  . Abscess and cellulitis   . Abscess of right shoulder 09/06/2015  . Normocytic anemia 09/06/2015  . Obesity, morbid ([place]) 09/06/2015  . Hypertension 09/06/2015  . Recurrent boils 09/06/2015  . MRSA (methicillin resistant staph aureus) culture positive 09/04/2015  . Abscess of left shoulder 08/31/2015  . Uncontrolled diabetes mellitus ([place]) 08/31/2015  . Abscess of right arm 08/31/2015    Past Surgical History:  Procedure Laterality Date  . HERNIA REPAIR     x2 when he was a baby  . INCISION AND DRAINAGE ABSCESS Right 09/03/2015   Procedure: INCISION AND DRAINAGE ABSCESS;  Surgeon: Roseanne Kaufman, MD;  Location: [facility];  Service: Orthopedics;  Laterality: Right;  . IRRIGATION AND DEBRIDEMENT ABSCESS Left 08/31/2015   Procedure: IRRIGATION AND DEBRIDEMENT ABSCESS LEFT SHOULDER;  Surgeon: Alphonsa Overall, MD;  Location: [facility];  Service: General;  Laterality: Left;       Home Medications    Prior to Admission  medications   Medication Sig Start Date End Date Taking? Authorizing Provider  acetaminophen (TYLENOL) 500 MG tablet Take 1,000 mg by mouth every 6 (six) hours as needed (for pain).   Yes Historical Provider, MD  metFORMIN (GLUCOPHAGE) 500 MG tablet Take 1 tablet (500 mg total) by mouth 2 (two) times daily with a meal. Patient taking differently: Take 1,000 mg by mouth at bedtime.  02/16/16 03/17/16 Yes Shawn C Joy, PA-C  naproxen (NAPROSYN) 500 MG tablet Take 1 tablet (500 mg total) by mouth 2 (two) times daily. 02/16/16  Yes Shawn C Joy, PA-C  neomycin-bacitracin-polymyxin (NEOSPORIN) [phone] ointment Apply 1 application topically at bedtime. TO AFFECTED SITE   Yes Historical Provider, MD  ondansetron (ZOFRAN ODT) 4 MG disintegrating tablet Take 1 tablet (4 mg total) by mouth every 8 (eight) hours as needed for nausea or vomiting. 02/16/16  Yes Shawn C Joy, PA-C  sulfamethoxazole-trimethoprim (BACTRIM DS,SEPTRA DS) 800-160 MG tablet Take 1 tablet by mouth 2 (two) times daily. 02/16/16 02/23/16 Yes Shawn C Joy, PA-C  acetaminophen (TYLENOL) 325 MG tablet Take 2 tablets (650 mg total) by mouth every 6 (six) hours as needed for mild pain (or temp > 100). Patient not taking: Reported on 02/22/2016 09/11/15   Belkys A Regalado, MD  amLODipine (NORVASC) 5 MG tablet Take 1 tablet (5 mg total) by mouth daily. Patient not taking: Reported on 02/22/2016 09/11/15   Belkys A Regalado, MD  doxycycline (VIBRA-TABS) 100 MG tablet Take 1 tablet (100  mg total) by mouth every 12 (twelve) hours. Patient not taking: Reported on 02/22/2016 09/11/15   Belkys A Regalado, MD  HYDROcodone-acetaminophen (NORCO/VICODIN) 5-325 MG tablet Take 1 tablet by mouth every 6 (six) hours as needed. Patient not taking: Reported on 02/22/2016 02/16/16   Shawn C Joy, PA-C  insulin glargine (LANTUS) 100 UNIT/ML injection Inject 0.35 mLs (35 Units total) into the skin 2 (two) times daily. Patient not taking: Reported on 02/22/2016 09/11/15    Belkys A Regalado, MD  Insulin Syringe 27G X 1/2" 1 ML MISC 1 application by Does not apply route 2 (two) times daily. Patient not taking: Reported on 02/22/2016 09/11/15   Belkys A Regalado, MD  nutrition supplement, JUVEN, (JUVEN) PACK Take 1 packet by mouth 2 (two) times daily between meals. Patient not taking: Reported on 02/22/2016 09/11/15   Elmarie Shiley, MD    Family History History reviewed. No pertinent family history.  Social History Social History  Substance Use Topics  . Smoking status: Never Smoker  . Smokeless tobacco: Never Used  . Alcohol use No     Allergies   Patient has no known allergies.   Review of Systems Review of Systems  Constitutional: Positive for fatigue.  HENT: Negative.   Respiratory: Negative.   Cardiovascular: Negative.   Gastrointestinal: Negative.   Endocrine:       Increased thirst  Musculoskeletal: Negative.   Skin: Positive for wound.       Open wound on presacral area  Allergic/Immunologic: Positive for immunocompromised state.       Diabetic  Neurological: Positive for weakness.       Generalized weakness  Psychiatric/Behavioral: Negative.   All other systems reviewed and are negative.    Physical Exam Updated Vital Signs BP 104/56   Pulse 98   Temp 97.7 F (36.5 C) (Oral)   Resp 22   SpO2 98%   Physical Exam  Constitutional: He appears well-developed and well-nourished. No distress.  HENT:  Head: Normocephalic and atraumatic.  Mucous membranes dry  Eyes: Conjunctivae are normal. Pupils are equal, round, and reactive to light.  Neck: Neck supple. No tracheal deviation present. No thyromegaly present.  Cardiovascular: Normal rate and regular rhythm.   No murmur heard. Pulmonary/Chest: Effort normal and breath sounds normal.  Abdominal: Soft. Bowel sounds are normal. He exhibits no distension. There is no tenderness.  Musculoskeletal: Normal range of motion. He exhibits no edema or tenderness.  Neurological: He is  alert. Coordination normal.  Skin: Skin is warm and dry. No rash noted.  Open Draining wound at the presacral area roughly 3 cm diameter, no surrounding tenderness  Psychiatric: He has a normal mood and affect.  Nursing note and vitals reviewed.      ED Treatments / Results  Labs (all labs ordered are listed, but only abnormal results are displayed) Labs Reviewed  BASIC METABOLIC PANEL - Abnormal; Notable for the following:       Result Value   Sodium 125 (*)    Chloride 88 (*)    CO2 19 (*)    Glucose, Bld 504 (*)    Creatinine, Ser 1.29 (*)    Anion gap 18 (*)    All other components within normal limits  CBC - Abnormal; Notable for the following:    WBC 36.1 (*)    Hemoglobin 12.7 (*)    HCT 35.6 (*)    All other components within normal limits  I-STAT CG4 LACTIC ACID, ED - Abnormal; Notable for  the following:    Lactic Acid, Venous 2.50 (*)    All other components within normal limits  I-STAT VENOUS BLOOD GAS, ED - Abnormal; Notable for the following:    pH, Ven 7.437 (*)    pCO2, Ven 30.4 (*)    pO2, Ven 61.0 (*)    Acid-base deficit 3.0 (*)    All other components within normal limits  CULTURE, BLOOD (ROUTINE X 2)  CULTURE, BLOOD (ROUTINE X 2)  AEROBIC/ANAEROBIC CULTURE (SURGICAL/DEEP WOUND)  URINALYSIS, ROUTINE W REFLEX MICROSCOPIC  BLOOD GAS, VENOUS   Results for orders placed or performed during the hospital encounter of 80/99/83  Basic metabolic panel  Result Value Ref Range   Sodium 125 (L) 135 - 145 mmol/L   Potassium 4.0 3.5 - 5.1 mmol/L   Chloride 88 (L) 101 - 111 mmol/L   CO2 19 (L) 22 - 32 mmol/L   Glucose, Bld 504 (HH) 65 - 99 mg/dL   BUN 19 6 - 20 mg/dL   Creatinine, Ser 1.29 (H) 0.61 - 1.24 mg/dL   Calcium 9.4 8.9 - 10.3 mg/dL   GFR calc non Af Amer >60 >60 mL/min   GFR calc Af Amer >60 >60 mL/min   Anion gap 18 (H) 5 - 15  CBC  Result Value Ref Range   WBC 36.1 (H) 4.0 - 10.5 K/uL   RBC 4.26 4.22 - 5.81 MIL/uL   Hemoglobin 12.7 (L) 13.0  - 17.0 g/dL   HCT 35.6 (L) 39.0 - 52.0 %   MCV 83.6 78.0 - 100.0 fL   MCH 29.8 26.0 - 34.0 pg   MCHC 35.7 30.0 - 36.0 g/dL   RDW 12.1 11.5 - 15.5 %   Platelets 394 150 - 400 K/uL  I-Stat CG4 Lactic Acid, ED  (not at  [facility])  Result Value Ref Range   Lactic Acid, Venous 2.50 (HH) 0.5 - 1.9 mmol/L   Comment NOTIFIED PHYSICIAN   I-Stat venous blood gas, ED  Result Value Ref Range   pH, Ven 7.437 (H) 7.250 - 7.430   pCO2, Ven 30.4 (L) 44.0 - 60.0 mmHg   pO2, Ven 61.0 (H) 32.0 - 45.0 mmHg   Bicarbonate 20.5 20.0 - 28.0 mmol/L   TCO2 21 0 - 100 mmol/L   O2 Saturation 92.0 %   Acid-base deficit 3.0 (H) 0.0 - 2.0 mmol/L   Patient temperature HIDE    Sample type VENOUS    Ct Pelvis W Contrast  Result Date: 02/22/2016 CLINICAL DATA:  Sacral wound, status post drainage x2 EXAM: CT PELVIS WITH CONTRAST TECHNIQUE: Multidetector CT imaging of the pelvis was performed using the standard protocol following the bolus administration of intravenous contrast. CONTRAST:  150mL ISOVUE-300 IOPAMIDOL (ISOVUE-300) INJECTION 61% COMPARISON:  02/16/2016 FINDINGS: Urinary Tract:  Bladder is within normal limits. Bowel: Visualized bowel is unremarkable. Specifically, the rectum is within normal limits. Vascular/Lymphatic: Iliac arteries are within normal limits. No suspicious pelvic lymphadenopathy. Reproductive:  Prostate is within normal limits. Other:  No pelvic ascites. Subcutaneous gas overlying the coccyx (series 2/image 30) and right gluteal region (series 2/ image 20). Associated subcutaneous stranding. Minimal subcutaneous fluid may be present (series 2/ image 33), but this is not reflect a well-defined fluid collection/abscess. Minimal fat within the right inguinal canal. Musculoskeletal: No focal osseous lesions. IMPRESSION: Subcutaneous gas overlying the coccyx and right gluteal region. Differential considerations include postprocedural change versus infection related to gas-forming organism. Minimal  subcutaneous fluid with stranding, favored to reflect cellulitis with phlegmonous change.  No well-defined fluid collection/abscess. Electronically Signed   By: Julian Hy M.D.   On: 02/22/2016 16:35   Ct Abdomen Pelvis W Contrast  Result Date: 02/16/2016 CLINICAL DATA:  Pain bold bold over the tail bone bold bold with onset 2 weeks ago. EXAM: CT ABDOMEN AND PELVIS WITH CONTRAST TECHNIQUE: Multidetector CT imaging of the abdomen and pelvis was performed using the standard protocol following bolus administration of intravenous contrast. CONTRAST:  121mL ISOVUE-300 IOPAMIDOL (ISOVUE-300) INJECTION 61% COMPARISON:  None. FINDINGS: LOWER CHEST: Lung bases are clear. Included heart size is normal. No pericardial effusion. HEPATOBILIARY: Liver and gallbladder are normal. PANCREAS: Normal. SPLEEN: Normal.  Small adjacent splenules are present. ADRENALS/URINARY TRACT: Kidneys are orthotopic, demonstrating symmetric enhancement. No nephrolithiasis, hydronephrosis or solid renal masses. The unopacified ureters are normal in course and caliber. Urinary bladder is partially distended and unremarkable. Normal adrenal glands. STOMACH/BOWEL: The stomach, small and large bowel are normal in course and caliber without inflammatory changes. Normal appendix. VASCULAR/LYMPHATIC: Aortoiliac vessels are normal in course and caliber. No lymphadenopathy by CT size criteria. REPRODUCTIVE: Normal. OTHER: No intraperitoneal free fluid or free air. MUSCULOSKELETAL: There is a small abscess with tiny focus of air along the upper portion of the natal cleft consistent with a pilonidal abscess. Margins are somewhat indistinct but estimated at 3.2 cm in diameter. Adjacent cellulitic soft tissue fatty induration is seen extending over the gluteal muscles bilaterally. No bone destruction is seen. IMPRESSION: 3.2 cm soft tissue abscess along the upper portion of the natal cleft consistent with a pilonidal abscess. No underlying bony  involvement noted. Subcutaneous fatty inflammation and edema noted adjacent to this finding bilaterally. Electronically Signed   By: Ashley Royalty M.D.   On: 02/16/2016 21:39    EKG  EKG Interpretation  Date/Time:  Wednesday February 22 2016 15:53:06 EST Ventricular Rate:  95 PR Interval:    QRS Duration: 103 QT Interval:  377 QTC Calculation: 474 R Axis:   67 Text Interpretation:  Sinus rhythm Inferolateral infarct, acute (LCx) Baseline wander in lead(s) V1 No old tracing to compare Confirmed by Winfred Leeds  MD, Mitzi Lilja [phone]) on 02/22/2016 5:05:41 PM Also confirmed by Winfred Leeds  MD, Tanay Misuraca [phone])  on 02/22/2016 5:06:36 PM       Radiology Ct Pelvis W Contrast  Result Date: 02/22/2016 CLINICAL DATA:  Sacral wound, status post drainage x2 EXAM: CT PELVIS WITH CONTRAST TECHNIQUE: Multidetector CT imaging of the pelvis was performed using the standard protocol following the bolus administration of intravenous contrast. CONTRAST:  173mL ISOVUE-300 IOPAMIDOL (ISOVUE-300) INJECTION 61% COMPARISON:  02/16/2016 FINDINGS: Urinary Tract:  Bladder is within normal limits. Bowel: Visualized bowel is unremarkable. Specifically, the rectum is within normal limits. Vascular/Lymphatic: Iliac arteries are within normal limits. No suspicious pelvic lymphadenopathy. Reproductive:  Prostate is within normal limits. Other:  No pelvic ascites. Subcutaneous gas overlying the coccyx (series 2/image 30) and right gluteal region (series 2/ image 20). Associated subcutaneous stranding. Minimal subcutaneous fluid may be present (series 2/ image 33), but this is not reflect a well-defined fluid collection/abscess. Minimal fat within the right inguinal canal. Musculoskeletal: No focal osseous lesions. IMPRESSION: Subcutaneous gas overlying the coccyx and right gluteal region. Differential considerations include postprocedural change versus infection related to gas-forming organism. Minimal subcutaneous fluid with stranding, favored  to reflect cellulitis with phlegmonous change. No well-defined fluid collection/abscess. Electronically Signed   By: Julian Hy M.D.   On: 02/22/2016 16:35    Procedures Procedures (including critical care time)  Medications Ordered in ED  Medications  oxyCODONE-acetaminophen (PERCOCET/ROXICET) 5-325 MG per tablet (not administered)  sodium chloride 0.9 % bolus 1,000 mL (1,000 mLs Intravenous New Bag/Given 02/22/16 1549)    And  sodium chloride 0.9 % bolus 1,000 mL (1,000 mLs Intravenous New Bag/Given 02/22/16 1652)    And  sodium chloride 0.9 % bolus 1,000 mL (1,000 mLs Intravenous New Bag/Given 02/22/16 1653)  insulin regular (NOVOLIN R,HUMULIN R) 250 Units in sodium chloride 0.9 % 250 mL (1 Units/mL) infusion (not administered)  dextrose 5 %-0.45 % sodium chloride infusion (not administered)  clindamycin (CLEOCIN) IVPB 600 mg (not administered)  ceFAZolin (ANCEF) IVPB 1 g/50 mL premix (1 g Intravenous New Bag/Given 02/22/16 1545)  Tdap (BOOSTRIX) injection 0.5 mL (0.5 mLs Intramuscular Given 02/22/16 1549)  iopamidol (ISOVUE-300) 61 % injection (100 mLs  Contrast Given 02/22/16 1608)    Results for orders placed or performed during the hospital encounter of 14/78/29  Basic metabolic panel  Result Value Ref Range   Sodium 125 (L) 135 - 145 mmol/L   Potassium 4.0 3.5 - 5.1 mmol/L   Chloride 88 (L) 101 - 111 mmol/L   CO2 19 (L) 22 - 32 mmol/L   Glucose, Bld 504 (HH) 65 - 99 mg/dL   BUN 19 6 - 20 mg/dL   Creatinine, Ser 1.29 (H) 0.61 - 1.24 mg/dL   Calcium 9.4 8.9 - 10.3 mg/dL   GFR calc non Af Amer >60 >60 mL/min   GFR calc Af Amer >60 >60 mL/min   Anion gap 18 (H) 5 - 15  CBC  Result Value Ref Range   WBC 36.1 (H) 4.0 - 10.5 K/uL   RBC 4.26 4.22 - 5.81 MIL/uL   Hemoglobin 12.7 (L) 13.0 - 17.0 g/dL   HCT 35.6 (L) 39.0 - 52.0 %   MCV 83.6 78.0 - 100.0 fL   MCH 29.8 26.0 - 34.0 pg   MCHC 35.7 30.0 - 36.0 g/dL   RDW 12.1 11.5 - 15.5 %   Platelets 394 150 - 400 K/uL    Urinalysis, Routine w reflex microscopic  Result Value Ref Range   Color, Urine YELLOW YELLOW   APPearance CLEAR CLEAR   Specific Gravity, Urine 1.030 1.005 - 1.030   pH 5.0 5.0 - 8.0   Glucose, UA >=500 (A) NEGATIVE mg/dL   Hgb urine dipstick NEGATIVE NEGATIVE   Bilirubin Urine NEGATIVE NEGATIVE   Ketones, ur 20 (A) NEGATIVE mg/dL   Protein, ur NEGATIVE NEGATIVE mg/dL   Nitrite NEGATIVE NEGATIVE   Leukocytes, UA NEGATIVE NEGATIVE   RBC / HPF NONE SEEN 0 - 5 RBC/hpf   WBC, UA 0-5 0 - 5 WBC/hpf   Bacteria, UA NONE SEEN NONE SEEN   Squamous Epithelial / LPF NONE SEEN NONE SEEN   Mucous PRESENT   I-Stat CG4 Lactic Acid, ED  (not at  [facility])  Result Value Ref Range   Lactic Acid, Venous 2.50 (HH) 0.5 - 1.9 mmol/L   Comment NOTIFIED PHYSICIAN   I-Stat venous blood gas, ED  Result Value Ref Range   pH, Ven 7.437 (H) 7.250 - 7.430   pCO2, Ven 30.4 (L) 44.0 - 60.0 mmHg   pO2, Ven 61.0 (H) 32.0 - 45.0 mmHg   Bicarbonate 20.5 20.0 - 28.0 mmol/L   TCO2 21 0 - 100 mmol/L   O2 Saturation 92.0 %   Acid-base deficit 3.0 (H) 0.0 - 2.0 mmol/L   Patient temperature HIDE    Sample type VENOUS   CBG monitoring, ED  Result Value  Ref Range   Glucose-Capillary 461 (H) 65 - 99 mg/dL   Ct Pelvis W Contrast  Result Date: 02/22/2016 CLINICAL DATA:  Sacral wound, status post drainage x2 EXAM: CT PELVIS WITH CONTRAST TECHNIQUE: Multidetector CT imaging of the pelvis was performed using the standard protocol following the bolus administration of intravenous contrast. CONTRAST:  156mL ISOVUE-300 IOPAMIDOL (ISOVUE-300) INJECTION 61% COMPARISON:  02/16/2016 FINDINGS: Urinary Tract:  Bladder is within normal limits. Bowel: Visualized bowel is unremarkable. Specifically, the rectum is within normal limits. Vascular/Lymphatic: Iliac arteries are within normal limits. No suspicious pelvic lymphadenopathy. Reproductive:  Prostate is within normal limits. Other:  No pelvic ascites. Subcutaneous gas overlying the  coccyx (series 2/image 30) and right gluteal region (series 2/ image 20). Associated subcutaneous stranding. Minimal subcutaneous fluid may be present (series 2/ image 33), but this is not reflect a well-defined fluid collection/abscess. Minimal fat within the right inguinal canal. Musculoskeletal: No focal osseous lesions. IMPRESSION: Subcutaneous gas overlying the coccyx and right gluteal region. Differential considerations include postprocedural change versus infection related to gas-forming organism. Minimal subcutaneous fluid with stranding, favored to reflect cellulitis with phlegmonous change. No well-defined fluid collection/abscess. Electronically Signed   By: Julian Hy M.D.   On: 02/22/2016 16:35   Ct Abdomen Pelvis W Contrast  Result Date: 02/16/2016 CLINICAL DATA:  Pain bold bold over the tail bone bold bold with onset 2 weeks ago. EXAM: CT ABDOMEN AND PELVIS WITH CONTRAST TECHNIQUE: Multidetector CT imaging of the abdomen and pelvis was performed using the standard protocol following bolus administration of intravenous contrast. CONTRAST:  153mL ISOVUE-300 IOPAMIDOL (ISOVUE-300) INJECTION 61% COMPARISON:  None. FINDINGS: LOWER CHEST: Lung bases are clear. Included heart size is normal. No pericardial effusion. HEPATOBILIARY: Liver and gallbladder are normal. PANCREAS: Normal. SPLEEN: Normal.  Small adjacent splenules are present. ADRENALS/URINARY TRACT: Kidneys are orthotopic, demonstrating symmetric enhancement. No nephrolithiasis, hydronephrosis or solid renal masses. The unopacified ureters are normal in course and caliber. Urinary bladder is partially distended and unremarkable. Normal adrenal glands. STOMACH/BOWEL: The stomach, small and large bowel are normal in course and caliber without inflammatory changes. Normal appendix. VASCULAR/LYMPHATIC: Aortoiliac vessels are normal in course and caliber. No lymphadenopathy by CT size criteria. REPRODUCTIVE: Normal. OTHER: No intraperitoneal  free fluid or free air. MUSCULOSKELETAL: There is a small abscess with tiny focus of air along the upper portion of the natal cleft consistent with a pilonidal abscess. Margins are somewhat indistinct but estimated at 3.2 cm in diameter. Adjacent cellulitic soft tissue fatty induration is seen extending over the gluteal muscles bilaterally. No bone destruction is seen. IMPRESSION: 3.2 cm soft tissue abscess along the upper portion of the natal cleft consistent with a pilonidal abscess. No underlying bony involvement noted. Subcutaneous fatty inflammation and edema noted adjacent to this finding bilaterally. Electronically Signed   By: Ashley Royalty M.D.   On: 02/16/2016 21:39   Initial Impression / Assessment and Plan / ED Course  I have reviewed the triage vital signs and the nursing notes.  Pertinent labs & imaging results that were available during my care of the patient were reviewed by me and considered in my medical decision making (see chart for details).  Clinical Course    Code sepsis called based on Sirs criteria of hypotension, tachypnea  Source of infection pilonidal abscess. IV antibiotics, IV insulin via glucose stabilizer and IV fluids bolus ordered based on MAP<65 6:15 PM patient resting comfortably. Alert Glasgow Coma Score 15. I consulted Dr.Cornett from surgical service who states that  wound will require debridement. He will see patient tomorrow. He is in agreement with initial choice of antibiotics however suggest antibiotic coverage may need to be broadened with vancomycin, Zosyn for future doses. He is requesting admission to hospitalist service. Patient felt to be in DKA based on widened anion gap and low bicarbonate. Hospitalist service consulted for admission Final Clinical Impressions(s) / ED Diagnoses  Diagnosis #1 pilonidal abscess #2 sepsis #3 diabetic ketoacidosis Final diagnoses:  None   CRITICAL CARE Performed by: Orlie Dakin Total critical care time: 45  minutes Critical care time was exclusive of separately billable procedures and treating other patients. Critical care was necessary to treat or prevent imminent or life-threatening deterioration. Critical care was time spent personally by me on the following activities: development of treatment plan with patient and/or surrogate as well as nursing, discussions with consultants, evaluation of patient's response to treatment, examination of patient, obtaining history from patient or surrogate, ordering and performing treatments and interventions, ordering and review of laboratory studies, ordering and review of radiographic studies, pulse oximetry and re-evaluation of patient's condition. New Prescriptions New Prescriptions   No medications on file     Orlie Dakin, MD 02/22/16 [phone]

## 2016-02-22 NOTE — Consult Note (Signed)
Reason for Consult: Sacral wound Referring Physician: Cathleen Fears MD   [name] [name] is an 30 y.o. male.  HPI: Asked to see patient at request of the emergency room doctor for sacral wound. He is a poorly controlled diabetic. He has had a three-week history of drainage from his sacrum region. He has a history of an abscess in this area drained by Dr. Lucia Gaskins in June 2017. He has signs of diabetic ketoacidosis currently is not taking his diabetic medications. He was noted to have a wound over his sacrum. There is a necrotic eschar that is open and draining. This is about 4 cm. There was minimal erythema. CT scan of the pelvis shows subcutaneous air with this open wound. I was asked to evaluate his wound. He is awake and alert. He states he been having drainage for about a month from this area. He feels tired and has some back pain tonight he states. He denies nausea or vomiting.  Past Medical History:  Diagnosis Date  . Asthma   . Diabetes mellitus without complication ([place])   . Hypertension     Past Surgical History:  Procedure Laterality Date  . HERNIA REPAIR     x2 when he was a baby  . INCISION AND DRAINAGE ABSCESS Right 09/03/2015   Procedure: INCISION AND DRAINAGE ABSCESS;  Surgeon: Roseanne Kaufman, MD;  Location: [facility];  Service: Orthopedics;  Laterality: Right;  . IRRIGATION AND DEBRIDEMENT ABSCESS Left 08/31/2015   Procedure: IRRIGATION AND DEBRIDEMENT ABSCESS LEFT SHOULDER;  Surgeon: Alphonsa Overall, MD;  Location: [facility];  Service: General;  Laterality: Left;    History reviewed. No pertinent family history.  Social History:  reports that he has never smoked. He has never used smokeless tobacco. He reports that he uses drugs, including Marijuana. He reports that he does not drink alcohol.  Allergies: No Known Allergies  Medications: I have reviewed the patient's current medications.  Results for orders placed or performed during the hospital encounter of 02/22/16 (from the past 48  hour(s))  Basic metabolic panel     Status: Abnormal   Collection Time: 02/22/16  1:01 PM  Result Value Ref Range   Sodium 125 (L) 135 - 145 mmol/L   Potassium 4.0 3.5 - 5.1 mmol/L   Chloride 88 (L) 101 - 111 mmol/L   CO2 19 (L) 22 - 32 mmol/L   Glucose, Bld 504 (HH) 65 - 99 mg/dL    Comment: CRITICAL RESULT CALLED TO, READ BACK BY AND VERIFIED WITH: A.WHEELER,RN 1344 02/22/16 CLARK,S    BUN 19 6 - 20 mg/dL   Creatinine, Ser 1.29 (H) 0.61 - 1.24 mg/dL   Calcium 9.4 8.9 - 10.3 mg/dL   GFR calc non Af Amer >60 >60 mL/min   GFR calc Af Amer >60 >60 mL/min    Comment: (NOTE) The eGFR has been calculated using the CKD EPI equation. This calculation has not been validated in all clinical situations. eGFR's persistently <60 mL/min signify possible Chronic Kidney Disease.    Anion gap 18 (H) 5 - 15  CBC     Status: Abnormal   Collection Time: 02/22/16  1:01 PM  Result Value Ref Range   WBC 36.1 (H) 4.0 - 10.5 K/uL   RBC 4.26 4.22 - 5.81 MIL/uL   Hemoglobin 12.7 (L) 13.0 - 17.0 g/dL   HCT 35.6 (L) 39.0 - 52.0 %   MCV 83.6 78.0 - 100.0 fL   MCH 29.8 26.0 - 34.0 pg   MCHC 35.7 30.0 -  36.0 g/dL   RDW 12.1 11.5 - 15.5 %   Platelets 394 150 - 400 K/uL  I-Stat CG4 Lactic Acid, ED  (not at  [facility])     Status: Abnormal   Collection Time: 02/22/16  4:06 PM  Result Value Ref Range   Lactic Acid, Venous 2.50 (HH) 0.5 - 1.9 mmol/L   Comment NOTIFIED PHYSICIAN   I-Stat venous blood gas, ED     Status: Abnormal   Collection Time: 02/22/16  4:06 PM  Result Value Ref Range   pH, Ven 7.437 (H) 7.250 - 7.430   pCO2, Ven 30.4 (L) 44.0 - 60.0 mmHg   pO2, Ven 61.0 (H) 32.0 - 45.0 mmHg   Bicarbonate 20.5 20.0 - 28.0 mmol/L   TCO2 21 0 - 100 mmol/L   O2 Saturation 92.0 %   Acid-base deficit 3.0 (H) 0.0 - 2.0 mmol/L   Patient temperature HIDE    Sample type VENOUS     Ct Pelvis W Contrast  Result Date: 02/22/2016 CLINICAL DATA:  Sacral wound, status post drainage x2 EXAM: CT PELVIS WITH  CONTRAST TECHNIQUE: Multidetector CT imaging of the pelvis was performed using the standard protocol following the bolus administration of intravenous contrast. CONTRAST:  130m ISOVUE-300 IOPAMIDOL (ISOVUE-300) INJECTION 61% COMPARISON:  02/16/2016 FINDINGS: Urinary Tract:  Bladder is within normal limits. Bowel: Visualized bowel is unremarkable. Specifically, the rectum is within normal limits. Vascular/Lymphatic: Iliac arteries are within normal limits. No suspicious pelvic lymphadenopathy. Reproductive:  Prostate is within normal limits. Other:  No pelvic ascites. Subcutaneous gas overlying the coccyx (series 2/image 30) and right gluteal region (series 2/ image 20). Associated subcutaneous stranding. Minimal subcutaneous fluid may be present (series 2/ image 33), but this is not reflect a well-defined fluid collection/abscess. Minimal fat within the right inguinal canal. Musculoskeletal: No focal osseous lesions. IMPRESSION: Subcutaneous gas overlying the coccyx and right gluteal region. Differential considerations include postprocedural change versus infection related to gas-forming organism. Minimal subcutaneous fluid with stranding, favored to reflect cellulitis with phlegmonous change. No well-defined fluid collection/abscess. Electronically Signed   By: SJulian HyM.D.   On: 02/22/2016 16:35    Review of Systems  Constitutional: Positive for malaise/fatigue. Negative for diaphoresis.  HENT: Negative for hearing loss and tinnitus.   Eyes: Negative for blurred vision and double vision.  Respiratory: Negative for cough and hemoptysis.   Cardiovascular: Negative for chest pain and palpitations.  Gastrointestinal: Positive for nausea. Negative for abdominal pain and heartburn.  Genitourinary: Negative for dysuria and urgency.  Musculoskeletal: Positive for back pain and myalgias.  Skin: Positive for itching. Negative for rash.  Neurological: Positive for weakness and headaches. Negative for  dizziness.  Endo/Heme/Allergies: Positive for polydipsia. Negative for environmental allergies.   Blood pressure 104/56, pulse 98, temperature 97.7 F (36.5 C), temperature source Oral, resp. rate 22, SpO2 98 %. Physical Exam  Constitutional: He is oriented to person, place, and time. He appears well-developed and well-nourished. No distress.  HENT:  Head: Normocephalic and atraumatic.  Eyes: EOM are normal. Pupils are equal, round, and reactive to light.  Neck: Normal range of motion. Neck supple.  Cardiovascular: Normal rate and regular rhythm.   Respiratory: Effort normal and breath sounds normal.  GI: Soft. He exhibits no distension.  Genitourinary:     Musculoskeletal: Normal range of motion.  Neurological: He is alert and oriented to person, place, and time.  Skin: Skin is warm and dry.    Assessment/Plan: Chronic sacral wound-patient would benefit from debridement the  operating room once metabolic issues are improved. The subcutaneous air secondary to an open area in the eschar. There is no signs of necrotizing infection by examination at all.  Diabetic ketoacidosis-recommend resuscitation per medicine to correct  Antibiotics-patient has received Ancef and clindamycin. This should be appropriate.  Diabetes mellitus type 2-he is poorly compliant and has poorly controlled diabetes leading to many of his infectious issues.  We'll check back tomorrow. Would keep area as dry as possible and keep him off the area if possible.  Mahlia Fernando A. 02/22/2016, 5:41 PM

## 2016-02-22 NOTE — ED Notes (Signed)
Delay in lab draw,  Pt currently in bathroom. 

## 2016-02-22 NOTE — H&P (Signed)
History and Physical    [name] [name] ZCH:[id number] DOB: 24-Sep-1985 DOA: 02/22/2016  PCP: No PCP Per Patient   Patient coming from: Home  Chief Complaint: Fevers, fatigue, malaise, low back pain at site of boil  HPI: [name] [name] is a 30 y.o. male with medical history significant for type 2 diabetes mellitus, hypertension, history of MRSA skin infection, and nonadherence to his medications, now presenting to the emergency department with several days of progressive fatigue, weakness, malaise, and pain at the site of a boil overlying the sacrum. Patient reports noting a wound at the top of the gluteal cleft several weeks ago and has been treated as an outpatient with Bactrim and doxycycline and had thought it was improving. Pain at the site has worsened significantly in recent days and this has been accompanied by subjective fevers and chills, with fatigue and malaise. Patient reports that the wound has been draining, mostly thin bloody liquid. He denies any chest pain or palpitations and denies any significant dyspnea or cough. He reports that he does not take his insulin due to financial constraints. He is prescribed Norvasc for hypertension, but has never taken it.  ED Course: Upon arrival to the ED, patient is found to be afebrile, saturating adequately on room air, hypotensive to the 80s/50s, mildly tachycardic, and mildly tachypneic. EKG features a sinus rhythm with ST abnormalities in the inferolateral leads with no prior for comparison. Chemistry panel is notable for a serum glucose of 504 with sodium of 125, bicarbonate of 19, anion gap of 18, and serum creatinine of 1.29, up from 0.86 one week ago. CBC features a leukocytosis to 36,100 with a stable normocytic anemia. Lactic acid is elevated to a value of 2.50 and a urinalysis is notable for glucosuria and ketonuria. CT of the pelvis revealed subcutaneous gas overlying the coccyx and right gluteal region with no discrete abscess or drainable  collection. Patient was given 3 L of normal saline in the emergency department with improvement in his blood pressure and tachycardia. He was started on an insulin infusion and treated with Ancef and clindamycin. Tdap was updated and Gen. surgery was consulted by the ED physician. Surgeon evaluated the patient in the ED and advises a medical admission. Patient will be admitted to the stepdown unit for ongoing evaluation and management of sepsis suspected secondary to skin/soft tissue infection, as well as DKA.  Review of Systems:  All other systems reviewed and apart from HPI, are negative.  Past Medical History:  Diagnosis Date  . Asthma   . Diabetes mellitus without complication ([place])   . Hypertension     Past Surgical History:  Procedure Laterality Date  . HERNIA REPAIR     x2 when he was a baby  . INCISION AND DRAINAGE ABSCESS Right 09/03/2015   Procedure: INCISION AND DRAINAGE ABSCESS;  Surgeon: Roseanne Kaufman, MD;  Location: [facility];  Service: Orthopedics;  Laterality: Right;  . IRRIGATION AND DEBRIDEMENT ABSCESS Left 08/31/2015   Procedure: IRRIGATION AND DEBRIDEMENT ABSCESS LEFT SHOULDER;  Surgeon: Alphonsa Overall, MD;  Location: [facility];  Service: General;  Laterality: Left;     reports that he has never smoked. He has never used smokeless tobacco. He reports that he uses drugs, including Marijuana. He reports that he does not drink alcohol.  No Known Allergies  History reviewed. No pertinent family history.   Prior to Admission medications   Medication Sig Start Date End Date Taking? Authorizing Provider  acetaminophen (TYLENOL) 500 MG tablet Take 1,000  mg by mouth every 6 (six) hours as needed (for pain).   Yes Historical Provider, MD  metFORMIN (GLUCOPHAGE) 500 MG tablet Take 1 tablet (500 mg total) by mouth 2 (two) times daily with a meal. Patient taking differently: Take 1,000 mg by mouth at bedtime.  02/16/16 03/17/16 Yes Shawn C Joy, PA-C  naproxen (NAPROSYN) 500 MG tablet  Take 1 tablet (500 mg total) by mouth 2 (two) times daily. 02/16/16  Yes Shawn C Joy, PA-C  neomycin-bacitracin-polymyxin (NEOSPORIN) [phone] ointment Apply 1 application topically at bedtime. TO AFFECTED SITE   Yes Historical Provider, MD  ondansetron (ZOFRAN ODT) 4 MG disintegrating tablet Take 1 tablet (4 mg total) by mouth every 8 (eight) hours as needed for nausea or vomiting. 02/16/16  Yes Shawn C Joy, PA-C  sulfamethoxazole-trimethoprim (BACTRIM DS,SEPTRA DS) 800-160 MG tablet Take 1 tablet by mouth 2 (two) times daily. 02/16/16 02/23/16 Yes Shawn C Joy, PA-C  acetaminophen (TYLENOL) 325 MG tablet Take 2 tablets (650 mg total) by mouth every 6 (six) hours as needed for mild pain (or temp > 100). Patient not taking: Reported on 02/22/2016 09/11/15   Belkys A Regalado, MD  amLODipine (NORVASC) 5 MG tablet Take 1 tablet (5 mg total) by mouth daily. Patient not taking: Reported on 02/22/2016 09/11/15   Belkys A Regalado, MD  doxycycline (VIBRA-TABS) 100 MG tablet Take 1 tablet (100 mg total) by mouth every 12 (twelve) hours. Patient not taking: Reported on 02/22/2016 09/11/15   Belkys A Regalado, MD  HYDROcodone-acetaminophen (NORCO/VICODIN) 5-325 MG tablet Take 1 tablet by mouth every 6 (six) hours as needed. Patient not taking: Reported on 02/22/2016 02/16/16   Shawn C Joy, PA-C  insulin glargine (LANTUS) 100 UNIT/ML injection Inject 0.35 mLs (35 Units total) into the skin 2 (two) times daily. Patient not taking: Reported on 02/22/2016 09/11/15   Belkys A Regalado, MD  Insulin Syringe 27G X 1/2" 1 ML MISC 1 application by Does not apply route 2 (two) times daily. Patient not taking: Reported on 02/22/2016 09/11/15   Belkys A Regalado, MD  nutrition supplement, JUVEN, (JUVEN) PACK Take 1 packet by mouth 2 (two) times daily between meals. Patient not taking: Reported on 02/22/2016 09/11/15   Elmarie Shiley, MD    Physical Exam: Vitals:   02/22/16 2115 02/22/16 2130 02/22/16 2200 02/22/16 2215  BP:  113/70 108/70 104/58 103/57  Pulse: 103 103 103 103  Resp:      Temp:      TempSrc:      SpO2: 99% 99% 100% 100%      Constitutional: NAD, calm, appears uncomfortable  Eyes: PERTLA, lids and conjunctivae normal ENMT: Mucous membranes are dry. Posterior pharynx clear of any exudate or lesions.   Neck: normal, supple, no masses, no thyromegaly Respiratory: clear to auscultation bilaterally, no wheezing, no crackles. Mild tachypnea.    Cardiovascular: S1 & S2 heard, regular rate and rhythm. No extremity edema. No significant JVD. Abdomen: No distension, no tenderness, no masses palpated. Bowel sounds normal.  Musculoskeletal: no clubbing / cyanosis. No joint deformity upper and lower extremities. Normal muscle tone.  Skin: open wound overlies coccyx just right of center with central black eschar and surrounding edema, erythema, and exquisite tenderness. Otherwise warm, dry, well-perfused. Neurologic: CN 2-12 grossly intact. Sensation intact, DTR normal. Strength 5/5 in all 4 limbs.  Psychiatric: Normal judgment and insight. Alert and oriented x 3. Normal mood and affect.     Labs on Admission: I have personally reviewed following labs  and imaging studies  CBC:  Recent Labs Lab 02/16/16 2000 02/22/16 1301  WBC 18.6* 36.1*  HGB 12.9* 12.7*  HCT 35.8* 35.6*  MCV 84.4 83.6  PLT 291 250   Basic Metabolic Panel:  Recent Labs Lab 02/16/16 2000 02/22/16 1301  NA 132* 125*  K 3.6 4.0  CL 93* 88*  CO2 26 19*  GLUCOSE 349* 504*  BUN 13 19  CREATININE 0.86 1.29*  CALCIUM 9.1 9.4   GFR: Estimated Creatinine Clearance: 93 mL/min (by C-G formula based on SCr of 1.29 mg/dL (H)). Liver Function Tests:  Recent Labs Lab 02/16/16 2000  AST 21  ALT 33  ALKPHOS 142*  BILITOT 1.0  PROT 7.3  ALBUMIN 3.5    Recent Labs Lab 02/16/16 2000  LIPASE 16   No results for input(s): AMMONIA in the last 168 hours. Coagulation Profile: No results for input(s): INR, PROTIME in the  last 168 hours. Cardiac Enzymes: No results for input(s): CKTOTAL, CKMB, CKMBINDEX, TROPONINI in the last 168 hours. BNP (last 3 results) No results for input(s): PROBNP in the last 8760 hours. HbA1C: No results for input(s): HGBA1C in the last 72 hours. CBG:  Recent Labs Lab 02/16/16 1854 02/22/16 1759 02/22/16 1931 02/22/16 2043 02/22/16 2147  GLUCAP 360* 461* 404* 389* 370*   Lipid Profile: No results for input(s): CHOL, HDL, LDLCALC, TRIG, CHOLHDL, LDLDIRECT in the last 72 hours. Thyroid Function Tests: No results for input(s): TSH, T4TOTAL, FREET4, T3FREE, THYROIDAB in the last 72 hours. Anemia Panel: No results for input(s): VITAMINB12, FOLATE, FERRITIN, TIBC, IRON, RETICCTPCT in the last 72 hours. Urine analysis:    Component Value Date/Time   COLORURINE YELLOW 02/22/2016 1757   APPEARANCEUR CLEAR 02/22/2016 1757   LABSPEC 1.030 02/22/2016 1757   PHURINE 5.0 02/22/2016 1757   GLUCOSEU >=500 (A) 02/22/2016 1757   HGBUR NEGATIVE 02/22/2016 1757   BILIRUBINUR NEGATIVE 02/22/2016 1757   KETONESUR 20 (A) 02/22/2016 1757   PROTEINUR NEGATIVE 02/22/2016 1757   NITRITE NEGATIVE 02/22/2016 1757   LEUKOCYTESUR NEGATIVE 02/22/2016 1757   Sepsis Labs: '@LABRCNTIP' (procalcitonin:4,lacticidven:4) ) Recent Results (from the past 240 hour(s))  Culture, blood (routine x 2)     Status: None   Collection Time: 02/16/16  8:00 PM  Result Value Ref Range Status   Specimen Description BLOOD RIGHT ARM  Final   Special Requests AEROBIC BOTTLE ONLY 5CC  Final   Culture   Final    NO GROWTH 5 DAYS Performed at [facility]    Report Status 02/21/2016 FINAL  Final  Culture, blood (routine x 2)     Status: None   Collection Time: 02/16/16  9:44 PM  Result Value Ref Range Status   Specimen Description BLOOD LEFT HAND  Final   Special Requests BOTTLES DRAWN AEROBIC AND ANAEROBIC 5ML  Final   Culture   Final    NO GROWTH 5 DAYS Performed at [facility]    Report  Status 02/21/2016 FINAL  Final  Aerobic Culture (superficial specimen)     Status: None (Preliminary result)   Collection Time: 02/22/16  6:36 PM  Result Value Ref Range Status   Specimen Description WOUND SACRAL  Final   Special Requests NONE  Final   Gram Stain NO WBC SEEN MODERATE GRAM POSITIVE COCCI   Final   Culture PENDING  Incomplete   Report Status PENDING  Incomplete     Radiological Exams on Admission: Ct Pelvis W Contrast  Result Date: 02/22/2016 CLINICAL DATA:  Sacral  wound, status post drainage x2 EXAM: CT PELVIS WITH CONTRAST TECHNIQUE: Multidetector CT imaging of the pelvis was performed using the standard protocol following the bolus administration of intravenous contrast. CONTRAST:  159m ISOVUE-300 IOPAMIDOL (ISOVUE-300) INJECTION 61% COMPARISON:  02/16/2016 FINDINGS: Urinary Tract:  Bladder is within normal limits. Bowel: Visualized bowel is unremarkable. Specifically, the rectum is within normal limits. Vascular/Lymphatic: Iliac arteries are within normal limits. No suspicious pelvic lymphadenopathy. Reproductive:  Prostate is within normal limits. Other:  No pelvic ascites. Subcutaneous gas overlying the coccyx (series 2/image 30) and right gluteal region (series 2/ image 20). Associated subcutaneous stranding. Minimal subcutaneous fluid may be present (series 2/ image 33), but this is not reflect a well-defined fluid collection/abscess. Minimal fat within the right inguinal canal. Musculoskeletal: No focal osseous lesions. IMPRESSION: Subcutaneous gas overlying the coccyx and right gluteal region. Differential considerations include postprocedural change versus infection related to gas-forming organism. Minimal subcutaneous fluid with stranding, favored to reflect cellulitis with phlegmonous change. No well-defined fluid collection/abscess. Electronically Signed   By: SJulian HyM.D.   On: 02/22/2016 16:35    EKG: Independently reviewed. Sinus rhythm, ST-abnormality  in inferolateral leads without prior available.   Assessment/Plan  1. Sepsis secondary to skin/soft-tissue infection - Presents with marked leukocytosis, elevated lactate, AKI, and hypotension  - Source is a skin/soft-tissue infection overlying coccyx; sq gas noted on CT and surgeon evaluated pt in ED - Clinically, there is low-suspicion for necrotizing infection; surgeon is following and plans on possible OR debridement  - Pt was treated with Ancef and clinda initially in ED, but given sepsis with end-organ damage and hypotension, will broaden to vanc and Zosyn for now  - Blood and wound cultures are incubating; pt was treated with 30 cc/kg NS bolus in ED; lactate is improving with IVF    2. DKA, uncontrolled type II DM  - Pt presents with serum glucose 504, bicarb 19, AG 18, and urine ketones  - Pt acknowledges never picking up his insulin from pharmacy, citing financial constraints; he has been taking metformin; A1c was 13.7% in June 2017  - Likely precipitated by the acute infection  - He is being fluid-resuscitated with NS and started on insulin infusion  - Admit to stepdown unit, continue insulin infusion until parameters met for transition to sq insulin; follow serial chem panels until DKA resolves    3. Acute kidney injury - SCr 1.29 on admission, up from 0.86 one week prior  - Likely a prerenal azotemia in the setting of sepsis and DKA  - Anticipate resolution with fluid-resuscitation; following serial chem panels as above and avoiding nephrotoxins where feasible    4. Hyponatremia  - Serum sodium is 125 on admission in setting of DKA and dehydration  - Some of this artifactual in setting of marked hyperglycemia; anticipate resolution with fluids and insulin  - Following serial chem panels as above   5. Hypotension, hx of HTN  - BP in 80s/50s in ED initially, coming up after the 30 cc/kg NS bolus  - Pt has hx of HTN and prescribed Norvasc, but has never filled it  - Continue  IVF for now and monitor BP in stepdown overnight     DVT prophylaxis: sq Lovenox  Code Status: Full  Family Communication: Discussed with patient Disposition Plan: Admit to stepdown Consults called: Surgery Admission status: Inpatient    TVianne Bulls MD [facility] Pager [phone] If 7PM-7AM, please contact night-coverage [email or web address] Password [email or web address]  02/22/2016, 10:28 PM

## 2016-02-22 NOTE — ED Triage Notes (Addendum)
Pt reports recent boil on his back but was told it had improved. Reports having lower back pain, pelvic pain, fatigue and weakness for several days. No relief with hydrocodone.

## 2016-02-22 NOTE — ED Notes (Signed)
PT stated he did not have to Urinate at this time

## 2016-02-22 NOTE — ED Provider Notes (Signed)
Blood pressure 120/57, pulse 104, temperature 97.7 F (36.5 C), temperature source Oral, resp. rate 23, SpO2 100 %.  Assuming care from Dr. Winfred Leeds.  In short, [name] [name] is a 30 y.o. male with a chief complaint of Back Pain and Fatigue .  Refer to the original H&P for additional details.  The current plan of care is to follow up with hospitalist team for admission.  08:15 PM Discussed patient's case with hospitalist, Dr. Myna Hidalgo.  Recommend admission to inpatient, stepdown bed.  I will place holding orders per their request. Patient and family (if present) updated with plan. Care transferred to hospitalist service.  I reviewed all nursing notes, vitals, pertinent old records, EKGs, labs, imaging (as available).  Nanda Quinton, MD   Margette Fast, MD 02/22/16 [id number]

## 2016-02-22 NOTE — ED Notes (Signed)
Pt still unable to urinate 

## 2016-02-22 NOTE — ED Notes (Signed)
Patient transported to CT 

## 2016-02-23 DIAGNOSIS — I959 Hypotension, unspecified: Secondary | ICD-10-CM | POA: Insufficient documentation

## 2016-02-23 LAB — BASIC METABOLIC PANEL
ANION GAP: 7 (ref 5–15)
ANION GAP: 7 (ref 5–15)
ANION GAP: 8 (ref 5–15)
ANION GAP: 9 (ref 5–15)
BUN: 14 mg/dL (ref 6–20)
BUN: 15 mg/dL (ref 6–20)
BUN: 13 mg/dL (ref 6–20)
BUN: 9 mg/dL (ref 6–20)
CALCIUM: 7.5 mg/dL — AB (ref 8.9–10.3)
CALCIUM: 8.1 mg/dL — AB (ref 8.9–10.3)
CALCIUM: 8.3 mg/dL — AB (ref 8.9–10.3)
CHLORIDE: 99 mmol/L — AB (ref 101–111)
CHLORIDE: 91 mmol/L — AB (ref 101–111)
CO2: 21 mmol/L — ABNORMAL LOW (ref 22–32)
CO2: 22 mmol/L (ref 22–32)
CO2: 22 mmol/L (ref 22–32)
CO2: 22 mmol/L (ref 22–32)
CREATININE: 0.9 mg/dL (ref 0.61–1.24)
CREATININE: 0.96 mg/dL (ref 0.61–1.24)
CREATININE: 0.98 mg/dL (ref 0.61–1.24)
Calcium: 8.1 mg/dL — ABNORMAL LOW (ref 8.9–10.3)
Chloride: 101 mmol/L (ref 101–111)
Chloride: 98 mmol/L — ABNORMAL LOW (ref 101–111)
Creatinine, Ser: 0.89 mg/dL (ref 0.61–1.24)
GFR calc Af Amer: >60 mL/min (ref >60)
GFR calc non Af Amer: >60 mL/min (ref >60)
GLUCOSE: 319 mg/dL — AB (ref 65–99)
Glucose, Bld: 580 mg/dL (ref 65–99)
Glucose, Bld: 158 mg/dL — ABNORMAL HIGH (ref 65–99)
Glucose, Bld: 145 mg/dL — ABNORMAL HIGH (ref 65–99)
POTASSIUM: 4 mmol/L (ref 3.5–5.1)
Potassium: 3 mmol/L — ABNORMAL LOW (ref 3.5–5.1)
Potassium: 3.4 mmol/L — ABNORMAL LOW (ref 3.5–5.1)
Potassium: 3.3 mmol/L — ABNORMAL LOW (ref 3.5–5.1)
SODIUM: 119 mmol/L — AB (ref 135–145)
SODIUM: 129 mmol/L — AB (ref 135–145)
SODIUM: 131 mmol/L — AB (ref 135–145)
Sodium: 128 mmol/L — ABNORMAL LOW (ref 135–145)

## 2016-02-23 LAB — PROTIME-INR
INR: 1.12
Prothrombin Time: 14.5 seconds (ref 11.4–15.2)

## 2016-02-23 LAB — GLUCOSE, CAPILLARY
GLUCOSE-CAPILLARY: 168 mg/dL — AB (ref 65–99)
GLUCOSE-CAPILLARY: 237 mg/dL — AB (ref 65–99)
GLUCOSE-CAPILLARY: 182 mg/dL — AB (ref 65–99)
GLUCOSE-CAPILLARY: 306 mg/dL — AB (ref 65–99)
Glucose-Capillary: 220 mg/dL — ABNORMAL HIGH (ref 65–99)
Glucose-Capillary: 122 mg/dL — ABNORMAL HIGH (ref 65–99)
Glucose-Capillary: 93 mg/dL (ref 65–99)
Glucose-Capillary: 111 mg/dL — ABNORMAL HIGH (ref 65–99)
Glucose-Capillary: 394 mg/dL — ABNORMAL HIGH (ref 65–99)

## 2016-02-23 LAB — RAPID URINE DRUG SCREEN, HOSP PERFORMED
Amphetamines: NOT DETECTED
Barbiturates: NOT DETECTED
Benzodiazepines: NOT DETECTED
Cocaine: NOT DETECTED
OPIATES: NOT DETECTED
Tetrahydrocannabinol: POSITIVE — AB

## 2016-02-23 LAB — HEMOGLOBIN A1C
HEMOGLOBIN A1C: 13.7 % — AB (ref 4.8–5.6)
MEAN PLASMA GLUCOSE: 346 mg/dL

## 2016-02-23 LAB — CBG MONITORING, ED
GLUCOSE-CAPILLARY: 152 mg/dL — AB (ref 65–99)
GLUCOSE-CAPILLARY: 203 mg/dL — AB (ref 65–99)
GLUCOSE-CAPILLARY: 228 mg/dL — AB (ref 65–99)
GLUCOSE-CAPILLARY: 234 mg/dL — AB (ref 65–99)
Glucose-Capillary: 152 mg/dL — ABNORMAL HIGH (ref 65–99)

## 2016-02-23 LAB — APTT: APTT: 35 s (ref 24–36)

## 2016-02-23 LAB — LACTIC ACID, PLASMA: LACTIC ACID, VENOUS: 1.7 mmol/L (ref 0.5–1.9)

## 2016-02-23 LAB — TROPONIN I: TROPONIN I: 0.17 ng/mL — AB (ref <0.03)

## 2016-02-23 LAB — MRSA PCR SCREENING: MRSA by PCR: NEGATIVE

## 2016-02-23 LAB — PROCALCITONIN: PROCALCITONIN: 0.98 ng/mL

## 2016-02-23 MED ORDER — ADULT MULTIVITAMIN W/MINERALS CH
1.0000 | ORAL_TABLET | Freq: Every day | ORAL | Status: DC
  Administered 2016-02-23 – 2016-02-29 (×7): 1 via ORAL
  Filled 2016-02-23 (×8): qty 1

## 2016-02-23 MED ORDER — INSULIN ASPART 100 UNIT/ML ~~LOC~~ SOLN
0.0000 [IU] | Freq: Every day | SUBCUTANEOUS | Status: DC
  Administered 2016-02-23: 5 [IU] via SUBCUTANEOUS

## 2016-02-23 MED ORDER — POTASSIUM CHLORIDE 20 MEQ PO PACK
40.0000 meq | PACK | Freq: Once | ORAL | Status: AC
  Administered 2016-02-23: 40 meq via ORAL
  Filled 2016-02-23 (×2): qty 2

## 2016-02-23 MED ORDER — ONDANSETRON HCL 4 MG/2ML IJ SOLN
4.0000 mg | Freq: Four times a day (QID) | INTRAMUSCULAR | Status: DC | PRN
  Administered 2016-02-24: 4 mg via INTRAVENOUS

## 2016-02-23 MED ORDER — PANTOPRAZOLE SODIUM 40 MG PO TBEC
40.0000 mg | DELAYED_RELEASE_TABLET | Freq: Every day | ORAL | Status: DC
  Administered 2016-02-23 – 2016-02-29 (×7): 40 mg via ORAL
  Filled 2016-02-23 (×7): qty 1

## 2016-02-23 MED ORDER — INSULIN ASPART 100 UNIT/ML ~~LOC~~ SOLN
0.0000 [IU] | Freq: Three times a day (TID) | SUBCUTANEOUS | Status: DC
  Administered 2016-02-23: 2 [IU] via SUBCUTANEOUS

## 2016-02-23 MED ORDER — INSULIN GLARGINE 100 UNIT/ML ~~LOC~~ SOLN
10.0000 [IU] | Freq: Two times a day (BID) | SUBCUTANEOUS | Status: DC
  Administered 2016-02-23 – 2016-02-24 (×4): 10 [IU] via SUBCUTANEOUS
  Filled 2016-02-23 (×6): qty 0.1

## 2016-02-23 MED ORDER — INSULIN ASPART 100 UNIT/ML ~~LOC~~ SOLN
0.0000 [IU] | Freq: Three times a day (TID) | SUBCUTANEOUS | Status: DC
  Administered 2016-02-23: 15 [IU] via SUBCUTANEOUS

## 2016-02-23 MED ORDER — SODIUM CHLORIDE 0.9 % IV SOLN
30.0000 meq | Freq: Once | INTRAVENOUS | Status: AC
  Administered 2016-02-23: 30 meq via INTRAVENOUS
  Filled 2016-02-23: qty 15

## 2016-02-23 NOTE — Progress Notes (Addendum)
Subjective: hungry  Objective: Vital signs in last 24 hours: Temp:  [97.7 F (36.5 C)-98.7 F (37.1 C)] 98.7 F (37.1 C) (12/07 0520) Pulse Rate:  [92-116] 107 (12/07 0600) Resp:  [6-27] 23 (12/07 0600) BP: (89-124)/(47-78) 110/56 (12/07 0600) SpO2:  [97 %-100 %] 98 % (12/07 0600) Weight:  [98.7 kg (217 lb 9.5 oz)] 98.7 kg (217 lb 9.5 oz) (12/07 0520) Last BM Date: 02/22/16  Intake/Output from previous day: 12/06 0701 - 12/07 0700 In: 690.3 [I.V.:690.3] Out: 950 [Urine:950] Intake/Output this shift: No intake/output data recorded.  General appearance: cooperative Resp: clear to auscultation bilaterally Skin: sacral wound with eschar and mild erythema  Lab Results:   Recent Labs  02/22/16 1301  WBC 36.1*  HGB 12.7*  HCT 35.6*  PLT 394   BMET  Recent Labs  02/23/16 0215 02/23/16 0557  NA 131* 129*  K 3.4* 3.3*  CL 101 98*  CO2 22 22  GLUCOSE 158* 145*  BUN 15 13  CREATININE 0.98 0.90  CALCIUM 8.3* 8.1*   PT/INR  Recent Labs  02/23/16 0033  LABPROT 14.5  INR 1.12   ABG  Recent Labs  02/22/16 1606  HCO3 20.5    Studies/Results: Ct Pelvis W Contrast  Result Date: 02/22/2016 CLINICAL DATA:  Sacral wound, status post drainage x2 EXAM: CT PELVIS WITH CONTRAST TECHNIQUE: Multidetector CT imaging of the pelvis was performed using the standard protocol following the bolus administration of intravenous contrast. CONTRAST:  156mL ISOVUE-300 IOPAMIDOL (ISOVUE-300) INJECTION 61% COMPARISON:  02/16/2016 FINDINGS: Urinary Tract:  Bladder is within normal limits. Bowel: Visualized bowel is unremarkable. Specifically, the rectum is within normal limits. Vascular/Lymphatic: Iliac arteries are within normal limits. No suspicious pelvic lymphadenopathy. Reproductive:  Prostate is within normal limits. Other:  No pelvic ascites. Subcutaneous gas overlying the coccyx (series 2/image 30) and right gluteal region (series 2/ image 20). Associated subcutaneous  stranding. Minimal subcutaneous fluid may be present (series 2/ image 33), but this is not reflect a well-defined fluid collection/abscess. Minimal fat within the right inguinal canal. Musculoskeletal: No focal osseous lesions. IMPRESSION: Subcutaneous gas overlying the coccyx and right gluteal region. Differential considerations include postprocedural change versus infection related to gas-forming organism. Minimal subcutaneous fluid with stranding, favored to reflect cellulitis with phlegmonous change. No well-defined fluid collection/abscess. Electronically Signed   By: Julian Hy M.D.   On: 02/22/2016 16:35   Dg Chest Port 1 View  Result Date: 02/22/2016 CLINICAL DATA:  Weakness and dyspnea.  Sepsis. EXAM: PORTABLE CHEST 1 VIEW COMPARISON:  None. FINDINGS: A single AP portable view of the chest demonstrates no focal airspace consolidation or alveolar edema. The lungs are grossly clear. There is no large effusion or pneumothorax. Cardiac and mediastinal contours appear unremarkable. IMPRESSION: No active disease. Electronically Signed   By: Andreas Newport M.D.   On: 02/22/2016 23:03    Anti-infectives: Anti-infectives    Start     Dose/Rate Route Frequency Ordered Stop   02/23/16 0600  piperacillin-tazobactam (ZOSYN) IVPB 3.375 g     3.375 g 12.5 mL/hr over 240 Minutes Intravenous Every 8 hours 02/22/16 2238     02/23/16 0600  vancomycin (VANCOCIN) IVPB 1000 mg/200 mL premix     1,000 mg 200 mL/hr over 60 Minutes Intravenous Every 8 hours 02/22/16 2238     02/22/16 2230  piperacillin-tazobactam (ZOSYN) IVPB 3.375 g     3.375 g 100 mL/hr over 30 Minutes Intravenous  Once 02/22/16 2227 02/22/16 2339   02/22/16 2230  vancomycin (VANCOCIN)  IVPB 1000 mg/200 mL premix     1,000 mg 200 mL/hr over 60 Minutes Intravenous  Once 02/22/16 2227 02/23/16 0058   02/22/16 1730  clindamycin (CLEOCIN) IVPB 600 mg     600 mg 100 mL/hr over 30 Minutes Intravenous  Once 02/22/16 1716 02/22/16 1952    02/22/16 1530  ceFAZolin (ANCEF) IVPB 1 g/50 mL premix     1 g 100 mL/hr over 30 Minutes Intravenous  Once 02/22/16 1522 02/22/16 1615      Assessment/Plan: 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 This will benefit from debridement in the OR once he improves medically. Will plan for tomorrow AM. I discussed the procedure, risks, and benefits with him and he is agreeable. Ok to eat once OK from a medical standpoint with NPO at midnight.  ID - on Vanc/Zosyn  DKA/hyponatremia - improving with medical management  LOS: 1 day    [name] [name] 02/23/2016

## 2016-02-23 NOTE — Progress Notes (Signed)
PROGRESS NOTE    [name] [name]  PIR:[id number] DOB: June 18, 1985 DOA: 02/22/2016 PCP: No PCP Per Patient    Brief Narrative:  30 yo male with T2DM, presents with the chief complain of fever, malaise and low back pain. Worsening pain at the site of gluteal wound, which has been draining bloody fluid. On the initial examination, he was afebrile, dehydrated, gluteal wound with erythema and edema. Noted leukocytosis and DKA. Admitted with working diagnosis of sepsis. Started on broad spectrum antibiotics, insulin infusion and surgery consulted.    Assessment & Plan:   Principal Problem:   Sepsis, unspecified organism [facility]) Active Problems:   Uncontrolled diabetes mellitus ([place])   Normocytic anemia   Hypertension   Cellulitis   AKI (acute kidney injury) ([place])   DKA (diabetic ketoacidoses) ([place])  1. Sepsis due to cellulitis, present on admission. Patient hemodynamically stable, blood pressure 100 to 116, will stop continuous infusion of IV fluids, and will bolus if needed, follow a restrictive IV fluids strategy. Continue antibiotic therapy with zosyn and vancomycin.   2. DKA. Anion gap has closed, patient has been on almost 8 hours on insulin infusion, taking account initial insulin resistance associated with DKA, difficult to calculate requirements. Last hour using about 4 units, will start patient on 10 units of basal insulin bid and glucose monitoring q 4 hours, if hypoglycemia, will reduce pm dose. Apparently patient noncompliant with medical therapy. T2DM on metformin at home. Advance diet.   3. Ulcerated wound with cellulitis. Will continue IV antibiotics, local wound care, will go for debridement in am, for source control. Noted necrotic tissue, unable to stage lesion.    4. AKI with hypokalemia. Renal function with cr trending down to 0.90 from 1,29 on admission, will follow on K and serum bicarb this pm, continue correction of hypokalemia with kcl.     DVT prophylaxis: lovenox  Code  Status: full  Family Communication: no family at the bedside  Disposition Plan: home   Consultants:   Surgery   Procedures:   Antimicrobials:    Vancomycin  Zosyn   Subjective: Patient feeling better, positive pain at the ulcerated wound, 7/10, sharp with no radiation, no associated symptoms, with mild improvement with pain medications.   Objective: Vitals:   02/23/16 0520 02/23/16 0530 02/23/16 0545 02/23/16 0600  BP:  110/63 118/70 (!) 110/56  Pulse: (!) 110 (!) 116 (!) 110 (!) 107  Resp: (!) 23 (!) 26 20 (!) 23  Temp: 98.7 F (37.1 C)     TempSrc: Oral     SpO2: 97% 98% 98% 98%  Weight: 98.7 kg (217 lb 9.5 oz)     Height: 5\' 7"  (1.702 m)       Intake/Output Summary (Last 24 hours) at 02/23/16 0815 Last data filed at 02/23/16 0606  Gross per 24 hour  Intake           690.25 ml  Output              950 ml  Net          -259.75 ml   Filed Weights   02/23/16 0520  Weight: 98.7 kg (217 lb 9.5 oz)    Examination:  General exam: deconditioned and ill looking appearing E ENT: mild pallor, oral mucosa moist.  Respiratory system: Clear to auscultation. Respiratory effort normal. No wheezing, rales or rhonchi.  Cardiovascular system: S1 & S2 heard, RRR. No JVD, murmurs, rubs, gallops or clicks. No pedal edema. Gastrointestinal system: Abdomen is nondistended,  soft and nontender. No organomegaly or masses felt. Normal bowel sounds heard. Central nervous system: Alert and oriented. No focal neurological deficits. Extremities: Symmetric 5 x 5 power. Skin: necrotic ulcerated lesion on the sacral region, positive erythema and edema, not abel to stage. No pus noted.    Data Reviewed: I have personally reviewed following labs and imaging studies  CBC:  Recent Labs Lab 02/16/16 2000 02/22/16 1301  WBC 18.6* 36.1*  HGB 12.9* 12.7*  HCT 35.8* 35.6*  MCV 84.4 83.6  PLT 291 829   Basic Metabolic Panel:  Recent Labs Lab 02/16/16 2000 02/22/16 1301  02/23/16 0033 02/23/16 0215 02/23/16 0557  NA 132* 125* 119* 131* 129*  K 3.6 4.0 3.0* 3.4* 3.3*  CL 93* 88* 91* 101 98*  CO2 26 19* 21* 22 22  GLUCOSE 349* 504* 580* 158* 145*  BUN 13 19 14 15 13   CREATININE 0.86 1.29* 0.96 0.98 0.90  CALCIUM 9.1 9.4 7.5* 8.3* 8.1*   GFR: Estimated Creatinine Clearance: 135.5 mL/min (by C-G formula based on SCr of 0.9 mg/dL). Liver Function Tests:  Recent Labs Lab 02/16/16 2000  AST 21  ALT 33  ALKPHOS 142*  BILITOT 1.0  PROT 7.3  ALBUMIN 3.5    Recent Labs Lab 02/16/16 2000  LIPASE 16   No results for input(s): AMMONIA in the last 168 hours. Coagulation Profile:  Recent Labs Lab 02/23/16 0033  INR 1.12   Cardiac Enzymes:  Recent Labs Lab 02/23/16 0215  TROPONINI 0.17*   BNP (last 3 results) No results for input(s): PROBNP in the last 8760 hours. HbA1C: No results for input(s): HGBA1C in the last 72 hours. CBG:  Recent Labs Lab 02/23/16 0311 02/23/16 0425 02/23/16 0534 02/23/16 0633 02/23/16 0738  GLUCAP 228* 152* 220* 168* 122*   Lipid Profile: No results for input(s): CHOL, HDL, LDLCALC, TRIG, CHOLHDL, LDLDIRECT in the last 72 hours. Thyroid Function Tests: No results for input(s): TSH, T4TOTAL, FREET4, T3FREE, THYROIDAB in the last 72 hours. Anemia Panel: No results for input(s): VITAMINB12, FOLATE, FERRITIN, TIBC, IRON, RETICCTPCT in the last 72 hours. Sepsis Labs:  Recent Labs Lab 02/16/16 2145 02/22/16 1606 02/22/16 2054 02/23/16 0033  PROCALCITON  --   --   --  0.98  LATICACIDVEN 1.43 2.50* 2.27* 1.7    Recent Results (from the past 240 hour(s))  Culture, blood (routine x 2)     Status: None   Collection Time: 02/16/16  8:00 PM  Result Value Ref Range Status   Specimen Description BLOOD RIGHT ARM  Final   Special Requests AEROBIC BOTTLE ONLY 5CC  Final   Culture   Final    NO GROWTH 5 DAYS Performed at [facility]    Report Status 02/21/2016 FINAL  Final  Culture, blood  (routine x 2)     Status: None   Collection Time: 02/16/16  9:44 PM  Result Value Ref Range Status   Specimen Description BLOOD LEFT HAND  Final   Special Requests BOTTLES DRAWN AEROBIC AND ANAEROBIC 5ML  Final   Culture   Final    NO GROWTH 5 DAYS Performed at [facility]    Report Status 02/21/2016 FINAL  Final  Aerobic Culture (superficial specimen)     Status: None (Preliminary result)   Collection Time: 02/22/16  6:36 PM  Result Value Ref Range Status   Specimen Description WOUND SACRAL  Final   Special Requests NONE  Final   Gram Stain NO WBC SEEN MODERATE GRAM  POSITIVE COCCI   Final   Culture PENDING  Incomplete   Report Status PENDING  Incomplete         Radiology Studies: Ct Pelvis W Contrast  Result Date: 02/22/2016 CLINICAL DATA:  Sacral wound, status post drainage x2 EXAM: CT PELVIS WITH CONTRAST TECHNIQUE: Multidetector CT imaging of the pelvis was performed using the standard protocol following the bolus administration of intravenous contrast. CONTRAST:  144mL ISOVUE-300 IOPAMIDOL (ISOVUE-300) INJECTION 61% COMPARISON:  02/16/2016 FINDINGS: Urinary Tract:  Bladder is within normal limits. Bowel: Visualized bowel is unremarkable. Specifically, the rectum is within normal limits. Vascular/Lymphatic: Iliac arteries are within normal limits. No suspicious pelvic lymphadenopathy. Reproductive:  Prostate is within normal limits. Other:  No pelvic ascites. Subcutaneous gas overlying the coccyx (series 2/image 30) and right gluteal region (series 2/ image 20). Associated subcutaneous stranding. Minimal subcutaneous fluid may be present (series 2/ image 33), but this is not reflect a well-defined fluid collection/abscess. Minimal fat within the right inguinal canal. Musculoskeletal: No focal osseous lesions. IMPRESSION: Subcutaneous gas overlying the coccyx and right gluteal region. Differential considerations include postprocedural change versus infection related to  gas-forming organism. Minimal subcutaneous fluid with stranding, favored to reflect cellulitis with phlegmonous change. No well-defined fluid collection/abscess. Electronically Signed   By: Julian Hy M.D.   On: 02/22/2016 16:35   Dg Chest Port 1 View  Result Date: 02/22/2016 CLINICAL DATA:  Weakness and dyspnea.  Sepsis. EXAM: PORTABLE CHEST 1 VIEW COMPARISON:  None. FINDINGS: A single AP portable view of the chest demonstrates no focal airspace consolidation or alveolar edema. The lungs are grossly clear. There is no large effusion or pneumothorax. Cardiac and mediastinal contours appear unremarkable. IMPRESSION: No active disease. Electronically Signed   By: Andreas Newport M.D.   On: 02/22/2016 23:03        Scheduled Meds: . enoxaparin (LOVENOX) injection  40 mg Subcutaneous Q24H  . piperacillin-tazobactam (ZOSYN)  IV  3.375 g Intravenous Q8H  . potassium chloride (KCL MULTIRUN) 30 mEq in 265 mL IVPB  30 mEq Intravenous Once  . vancomycin  1,000 mg Intravenous Q8H   Continuous Infusions: . sodium chloride Stopped (02/23/16 0015)  . dextrose 5 % and 0.45% NaCl 100 mL/hr at 02/23/16 0012  . insulin (NOVOLIN-R) infusion 4.5 Units/hr (02/23/16 0739)     LOS: 1 day        Mauricio Gerome Apley, MD [facility] Pager [phone]  If 7PM-7AM, please contact night-coverage [email or web address] Password [email or web address] 02/23/2016, 8:15 AM

## 2016-02-23 NOTE — Progress Notes (Addendum)
Inpatient Diabetes Program Recommendations  AACE/ADA: New Consensus Statement on Inpatient Glycemic Control (2015)  Target Ranges:  Prepandial:   less than 140 mg/dL      Peak postprandial:   less than 180 mg/dL (1-2 hours)      Critically ill patients:  140 - 180 mg/dL  Results for [name] [name], [name] [name] (MRN [id number]) as of 02/23/2016 09:34  Ref. Range 02/23/2016 00:07 02/23/2016 01:08 02/23/2016 02:18 02/23/2016 03:11 02/23/2016 04:25 02/23/2016 05:34 02/23/2016 06:33 02/23/2016 07:38 02/23/2016 08:44  Glucose-Capillary Latest Ref Range: 65 - 99 mg/dL 234 (H) 203 (H) 152 (H) 228 (H) 152 (H) 220 (H) 168 (H) 122 (H) 93   Results for [name] [name], [name] [name] (MRN [id number]) as of 02/23/2016 09:34  Ref. Range 02/22/2016 17:59 02/22/2016 19:31 02/22/2016 20:43 02/22/2016 21:47 02/22/2016 23:02  Glucose-Capillary Latest Ref Range: 65 - 99 mg/dL 461 (H) 404 (H) 389 (H) 370 (H) 300 (H)   Review of Glycemic Control  Diabetes history: DM2 Outpatient Diabetes medications: Metformin 1000 mg QHS, Lantus 35 units BID (however, has not been taking Lantus due to cost) Current orders for Inpatient glycemic control: Lantus 10 units BID, Novolog 0-9 units TID with meals  Inpatient Diabetes Program Recommendations: Insulin - Basal: Patient has no insurance and has not been taking insulin due to cost. Please discontinue Lantus and order 70/30 15 units BID starting with supper today (will provide 21 units for basal and 9 units for meal coverage per day). Insulin-Correction: Please consider ordering Novolog bedtime correction scale. A1C: A1C in process.  IV fluids: Please consider discontinuing dextrose in IV fluids. Outpatient DM regimen: MD, please consider discharging patient on NOVOLIN 70/30 insulin which patient can purchase for $25 per vial.  Addendum 02/23/16@12 :55-Spoke with patient about diabetes and home regimen for diabetes control. Patient reports that he is taking Metformin 1000 mg QHS (getting from his mother) and he has not  taken insulin in "long time because I can not afford it". Patient reports that he has a glucometer at home but is out of test strips for the glucometer. Patient does not have any insurance and reports that he can not afford to get Lantus or to follow up with PCP. Inquired about whether he has ever taken 70/30 insulin and patient states that he has not ever taken 70/30 in the past. Informed patient that Novolin 70/30 can be purchased at [facility] for $25 per vial. Provided patient with handout information on Reli-On products and encouraged patient to go to [place] to get the Reli-On Prime glucometer for $9 and a box of 50 Reli-On test strips for $9. Discussed glucose and A1C goals. Discussed importance of checking CBGs and maintaining good CBG control to prevent long-term and short-term complications. Explained how hyperglycemia leads to damage within blood vessels which lead to the common complications seen with uncontrolled diabetes. Stressed to the patient the importance of improving glycemic control to prevent further complications from uncontrolled diabetes and to promote wound healing. Discussed impact of nutrition, exercise, stress, sickness, and medications on diabetes control. Encouraged patient to get a Reli-On glucometer and testing strips since the test strips are more affordable. Asked patient to check his glucose 3-4 times per day (before meals and at bedtime) and to keep a log book of glucose readings and insulin taken which he will need to take to doctor appointments. Explained how the doctor he follows up with can use the log book to continue to make insulin adjustments if needed. Patient verbalized understanding of information discussed and he states  that he has no further questions at this time related to diabetes.  Thanks, Barnie Alderman, RN, MSN, CDE Diabetes Coordinator Inpatient Diabetes Program [phone] (Team Pager from 8am to 5pm)

## 2016-02-23 NOTE — Progress Notes (Signed)
Initial Nutrition Assessment  DOCUMENTATION CODES:   Obesity unspecified  INTERVENTION:   Once diet is advanced, Glucerna Shake po BID, each supplement provides 220 kcal and 10 grams of protein  MVI daily  Encouraged compliance with DM diet/medications. Will provide further education as appropriate.   NUTRITION DIAGNOSIS:   Increased nutrient needs related to wound healing as evidenced by estimated needs.  GOAL:   Patient will meet greater than or equal to 90% of their needs  MONITOR:   PO intake, Supplement acceptance, I & O's, Skin  REASON FOR ASSESSMENT:   Malnutrition Screening Tool    ASSESSMENT:   Pt with PMH significant for type 2 DM, HTN, history of MRSA skin infection, and nonadherence to his medications, now presenting to the emergency department with several days of progressive fatigue, weakness, malaise, and pain at the site of a boil overlying the sacrum.   Plan for debridement in OR 12/8 per surgery.   Per pt his appetite varies at home. Does not monitor blood sugar at home but knows it must be high. Explained how this can impact his appetite/weight/wound healing.  Medications reviewed and include: novolog, lantus, insulin drip Labs reviewed: Na 129, K+ 3.3 CBG's: [phone] Nutrition-Focused physical exam completed. Findings are no fat depletion, no muscle depletion, and no edema.     Diet Order:  Diet clear liquid Room service appropriate? Yes; Fluid consistency: Thin Diet NPO time specified  Skin:  Wound (see comment) (non-pressure wound to sacrum)  Last BM:  12/6  Height:   Ht Readings from Last 1 Encounters:  02/23/16 5\' 7"  (1.702 m)    Weight:   Wt Readings from Last 1 Encounters:  02/23/16 217 lb 9.5 oz (98.7 kg)    Ideal Body Weight:  67.2 kg  BMI:  Body mass index is 34.08 kg/m.  Estimated Nutritional Needs:   Kcal:  2000-2200  Protein:  115-130 grams  Fluid:  > 2.0 L/day  EDUCATION NEEDS:   Education needs  addressed  Maylon Peppers RD, [place], [place] Pager [phone] After Hours Pager

## 2016-02-23 NOTE — Care Management Note (Addendum)
Case Management Note  Patient Details  Name: [name] [name] MRN: [id number] Date of Birth: Jul 20, 1985  Subjective/Objective:     Pt admitted with DKA and low back pain at site of a boil        Action/Plan:  Pt alert and oriented during assessment, wife at bedside.  Pt and wife stated pt will go back home with wife at discharge. On last admit pt was set up with [facility] for June 2017 and was a no call/no show.  Pt applied for charity for last admit however never turned in the requested paperwork.  Pt also received MATCH last admit but informed CM that he wasn't able to use it.  Pt stated he has needed equipment for DM.   Pt informed CM that he has recently went to [employer] and had the paper work to turn in for orange card - pt stated he just never got around to turning it in.    CM along with bedside nurse educated both pt and wife about the importance of following up on doctor appts, requested paper work and prescriptions.  Wife stated she would ensure transportation for pt to appts and to pick medications.  CM contacted [facility] to inquire if pt was active with [facility] for hhRN and was told that referral was cancelled for "safety issues" - pt states HH never contacted him - denied safety issues in the home, stated that he no longer needs HHRN for shoulder dressing changes.  Address verified against Epic  , CM will need to schedule follow up appt with [facility] and provide [facility] letter prior to discharge.   Expected Discharge Date:                  Expected Discharge Plan:  Home/Self Care  In-House Referral:     Discharge planning Services  CM Consult  Post Acute Care Choice:    Choice offered to:  Patient  DME Arranged:    DME Agency:     HH Arranged:    [place] Agency:     Status of Service:  In process, will continue to follow  If discussed at Long Length of Stay Meetings, dates discussed:    Additional Comments:  Maryclare Labrador, RN 02/23/2016, 3:56 PM

## 2016-02-23 NOTE — Progress Notes (Signed)
Pt CBG 237 @ 1100, pt had CL diet, after educated not to eat until 1130 due to transition off Insulin gtt and receiving Lantus, planned to recheck CBG at 1200, d/c insulin gtt and give SSI according to protocol, nursing will cont to monitor

## 2016-02-24 ENCOUNTER — Encounter (HOSPITAL_COMMUNITY): Payer: Self-pay | Admitting: *Deleted

## 2016-02-24 ENCOUNTER — Encounter (HOSPITAL_COMMUNITY)
Admission: EM | Disposition: A | Discharge status: Home Health | Payer: Self-pay | Source: Home / Self Care | Attending: Internal Medicine

## 2016-02-24 ENCOUNTER — Inpatient Hospital Stay (HOSPITAL_COMMUNITY): Payer: Self-pay | Admitting: Certified Registered Nurse Anesthetist

## 2016-02-24 HISTORY — PX: WOUND DEBRIDEMENT: SHX247

## 2016-02-24 LAB — AEROBIC CULTURE W GRAM STAIN (SUPERFICIAL SPECIMEN): Gram Stain: NONE SEEN

## 2016-02-24 LAB — GLUCOSE, CAPILLARY
GLUCOSE-CAPILLARY: 344 mg/dL — AB (ref 65–99)
GLUCOSE-CAPILLARY: 327 mg/dL — AB (ref 65–99)
GLUCOSE-CAPILLARY: 281 mg/dL — AB (ref 65–99)
GLUCOSE-CAPILLARY: 401 mg/dL — AB (ref 65–99)
Glucose-Capillary: 338 mg/dL — ABNORMAL HIGH (ref 65–99)

## 2016-02-24 LAB — CBC WITH DIFFERENTIAL/PLATELET
BASOS ABS: 0.3 10*3/uL — AB (ref 0.0–0.1)
BASOS PCT: 1 %
EOS ABS: 0 10*3/uL (ref 0.0–0.7)
Eosinophils Relative: 0 %
HCT: 30.5 % — ABNORMAL LOW (ref 39.0–52.0)
Hemoglobin: 10.9 g/dL — ABNORMAL LOW (ref 13.0–17.0)
LYMPHS PCT: 6 %
Lymphs Abs: 1.9 10*3/uL (ref 0.7–4.0)
MCH: 30 pg (ref 26.0–34.0)
MCHC: 35.7 g/dL (ref 30.0–36.0)
MCV: 84 fL (ref 78.0–100.0)
MONO ABS: 2.2 10*3/uL — AB (ref 0.1–1.0)
Monocytes Relative: 7 %
NEUTROS ABS: 27.7 10*3/uL — AB (ref 1.7–7.7)
Neutrophils Relative %: 86 %
PLATELETS: 310 10*3/uL (ref 150–400)
RBC: 3.63 MIL/uL — ABNORMAL LOW (ref 4.22–5.81)
RDW: 12.6 % (ref 11.5–15.5)
WBC: 32.1 10*3/uL — ABNORMAL HIGH (ref 4.0–10.5)

## 2016-02-24 LAB — BASIC METABOLIC PANEL
Anion gap: 10 (ref 5–15)
BUN: 9 mg/dL (ref 6–20)
CALCIUM: 8.3 mg/dL — AB (ref 8.9–10.3)
CHLORIDE: 96 mmol/L — AB (ref 101–111)
CO2: 23 mmol/L (ref 22–32)
CREATININE: 0.89 mg/dL (ref 0.61–1.24)
Glucose, Bld: 374 mg/dL — ABNORMAL HIGH (ref 65–99)
Potassium: 3.7 mmol/L (ref 3.5–5.1)
SODIUM: 129 mmol/L — AB (ref 135–145)

## 2016-02-24 LAB — VANCOMYCIN, TROUGH: VANCOMYCIN TR: 39 ug/mL — AB (ref 15–20)

## 2016-02-24 SURGERY — DEBRIDEMENT, WOUND
Anesthesia: General | Site: Back

## 2016-02-24 MED ORDER — MORPHINE SULFATE (PF) 2 MG/ML IV SOLN: 1.0000 mg | INTRAVENOUS | Status: DC | PRN

## 2016-02-24 MED ORDER — PROMETHAZINE HCL 25 MG/ML IJ SOLN: 6.2500 mg | INTRAMUSCULAR | Status: DC | PRN

## 2016-02-24 MED ORDER — HYDROMORPHONE HCL 1 MG/ML IJ SOLN
0.2500 mg | INTRAMUSCULAR | Status: DC | PRN
  Administered 2016-02-24 (×3): 0.5 mg via INTRAVENOUS

## 2016-02-24 MED ORDER — MIDAZOLAM HCL 5 MG/5ML IJ SOLN
INTRAMUSCULAR | Status: DC | PRN
  Administered 2016-02-24: 2 mg via INTRAVENOUS

## 2016-02-24 MED ORDER — SUGAMMADEX SODIUM 200 MG/2ML IV SOLN
INTRAVENOUS | Status: DC | PRN
Start: 2016-02-24 — End: 2016-02-24
  Administered 2016-02-24: 200 mg via INTRAVENOUS

## 2016-02-24 MED ORDER — LACTATED RINGERS IV SOLN: INTRAVENOUS | Status: DC

## 2016-02-24 MED ORDER — FENTANYL CITRATE (PF) 100 MCG/2ML IJ SOLN
INTRAMUSCULAR | Status: DC | PRN
  Administered 2016-02-24 (×2): 50 ug via INTRAVENOUS

## 2016-02-24 MED ORDER — INSULIN ASPART 100 UNIT/ML ~~LOC~~ SOLN
0.0000 [IU] | SUBCUTANEOUS | Status: DC
  Administered 2016-02-24: 15 [IU] via SUBCUTANEOUS
  Administered 2016-02-24: 11 [IU] via SUBCUTANEOUS

## 2016-02-24 MED ORDER — ROCURONIUM BROMIDE 100 MG/10ML IV SOLN
INTRAVENOUS | Status: DC | PRN
  Administered 2016-02-24: 50 mg via INTRAVENOUS

## 2016-02-24 MED ORDER — MIDAZOLAM HCL 2 MG/2ML IJ SOLN
INTRAMUSCULAR | Status: AC
  Filled 2016-02-24: qty 2

## 2016-02-24 MED ORDER — HYDROMORPHONE HCL 1 MG/ML IJ SOLN
INTRAMUSCULAR | Status: AC
  Filled 2016-02-24: qty 0.5

## 2016-02-24 MED ORDER — LACTATED RINGERS IV SOLN
INTRAVENOUS | Status: DC
  Administered 2016-02-24: 10:00:00 via INTRAVENOUS

## 2016-02-24 MED ORDER — INSULIN ASPART 100 UNIT/ML ~~LOC~~ SOLN
0.0000 [IU] | SUBCUTANEOUS | Status: DC
  Administered 2016-02-24: 20 [IU] via SUBCUTANEOUS
  Administered 2016-02-24: 15 [IU] via SUBCUTANEOUS
  Administered 2016-02-25: 7 [IU] via SUBCUTANEOUS
  Administered 2016-02-25: 11 [IU] via SUBCUTANEOUS
  Administered 2016-02-25: 15 [IU] via SUBCUTANEOUS
  Administered 2016-02-25 (×2): 20 [IU] via SUBCUTANEOUS
  Administered 2016-02-26: 11 [IU] via SUBCUTANEOUS

## 2016-02-24 MED ORDER — OXYCODONE HCL 5 MG PO TABS
5.0000 mg | ORAL_TABLET | ORAL | Status: DC | PRN
  Administered 2016-02-24 – 2016-02-26 (×6): 10 mg via ORAL
  Administered 2016-02-26: 5 mg via ORAL
  Administered 2016-02-27 (×3): 10 mg via ORAL
  Filled 2016-02-24 (×10): qty 2

## 2016-02-24 MED ORDER — 0.9 % SODIUM CHLORIDE (POUR BTL) OPTIME
TOPICAL | Status: DC | PRN
  Administered 2016-02-24: 1000 mL

## 2016-02-24 MED ORDER — PROPOFOL 10 MG/ML IV BOLUS
INTRAVENOUS | Status: AC
  Filled 2016-02-24: qty 20

## 2016-02-24 MED ORDER — SODIUM CHLORIDE 0.9 % IV SOLN
INTRAVENOUS | Status: DC
  Administered 2016-02-24 – 2016-02-28 (×4): via INTRAVENOUS

## 2016-02-24 MED ORDER — HYDROMORPHONE HCL 1 MG/ML IJ SOLN
INTRAMUSCULAR | Status: AC
  Filled 2016-02-24: qty 1

## 2016-02-24 MED ORDER — MEPERIDINE HCL 25 MG/ML IJ SOLN: 6.2500 mg | INTRAMUSCULAR | Status: DC | PRN

## 2016-02-24 MED ORDER — MORPHINE SULFATE (PF) 2 MG/ML IV SOLN
2.0000 mg | INTRAVENOUS | Status: DC | PRN
  Administered 2016-02-25 – 2016-02-28 (×2): 4 mg via INTRAVENOUS
  Administered 2016-02-29: 2 mg via INTRAVENOUS
  Filled 2016-02-24 (×3): qty 2

## 2016-02-24 MED ORDER — PROPOFOL 10 MG/ML IV BOLUS
INTRAVENOUS | Status: DC | PRN
  Administered 2016-02-24: 150 mg via INTRAVENOUS

## 2016-02-24 MED ORDER — FENTANYL CITRATE (PF) 100 MCG/2ML IJ SOLN
INTRAMUSCULAR | Status: AC
  Filled 2016-02-24: qty 2

## 2016-02-24 MED ORDER — LIDOCAINE HCL (CARDIAC) 20 MG/ML IV SOLN
INTRAVENOUS | Status: DC | PRN
  Administered 2016-02-24: 60 mg via INTRAVENOUS

## 2016-02-24 MED ORDER — POTASSIUM CHLORIDE 2 MEQ/ML IV SOLN
30.0000 meq | Freq: Once | INTRAVENOUS | Status: AC
  Administered 2016-02-24: 30 meq via INTRAVENOUS
  Filled 2016-02-24: qty 15

## 2016-02-24 SURGICAL SUPPLY — 41 items
BLADE SURG 15 STRL LF DISP TIS (BLADE) ×1 IMPLANT
BLADE SURG 15 STRL SS (BLADE) ×1
BNDG GAUZE ELAST 4 BULKY (GAUZE/BANDAGES/DRESSINGS) ×2 IMPLANT
BRIEF STRETCH FOR OB PAD LRG (UNDERPADS AND DIAPERS) ×2 IMPLANT
CANISTER SUCTION 2500CC (MISCELLANEOUS) ×2 IMPLANT
CLEANER TIP ELECTROSURG 2X2 (MISCELLANEOUS) IMPLANT
COVER SURGICAL LIGHT HANDLE (MISCELLANEOUS) ×2 IMPLANT
DRAPE LAPAROTOMY 100X72 PEDS (DRAPES) ×2 IMPLANT
DRAPE UTILITY XL STRL (DRAPES) ×2 IMPLANT
DRSG PAD ABDOMINAL 8X10 ST (GAUZE/BANDAGES/DRESSINGS) ×4 IMPLANT
ELECT REM PT RETURN 9FT ADLT (ELECTROSURGICAL) ×2
ELECTRODE REM PT RTRN 9FT ADLT (ELECTROSURGICAL) ×1 IMPLANT
GAUZE PACKING IODOFORM 1 (PACKING) IMPLANT
GAUZE SPONGE 4X4 12PLY STRL (GAUZE/BANDAGES/DRESSINGS) IMPLANT
GAUZE SPONGE 4X4 16PLY XRAY LF (GAUZE/BANDAGES/DRESSINGS) IMPLANT
GLOVE BIO SURGEON STRL SZ8 (GLOVE) ×2 IMPLANT
GLOVE BIOGEL PI IND STRL 7.0 (GLOVE) ×1 IMPLANT
GLOVE BIOGEL PI IND STRL 8 (GLOVE) ×1 IMPLANT
GLOVE BIOGEL PI INDICATOR 7.0 (GLOVE) ×1
GLOVE BIOGEL PI INDICATOR 8 (GLOVE) ×1
GOWN STRL REUS W/ TWL LRG LVL3 (GOWN DISPOSABLE) ×1 IMPLANT
GOWN STRL REUS W/ TWL XL LVL3 (GOWN DISPOSABLE) ×1 IMPLANT
GOWN STRL REUS W/TWL LRG LVL3 (GOWN DISPOSABLE) ×1
GOWN STRL REUS W/TWL XL LVL3 (GOWN DISPOSABLE) ×1
KIT BASIN OR (CUSTOM PROCEDURE TRAY) ×2 IMPLANT
KIT ROOM TURNOVER OR (KITS) ×2 IMPLANT
NS IRRIG 1000ML POUR BTL (IV SOLUTION) ×2 IMPLANT
PACK LITHOTOMY IV (CUSTOM PROCEDURE TRAY) IMPLANT
PACK SURGICAL SETUP 50X90 (CUSTOM PROCEDURE TRAY) ×2 IMPLANT
PAD ARMBOARD 7.5X6 YLW CONV (MISCELLANEOUS) ×6 IMPLANT
PENCIL BUTTON HOLSTER BLD 10FT (ELECTRODE) ×2 IMPLANT
SPECIMEN JAR MEDIUM (MISCELLANEOUS) ×2 IMPLANT
SPONGE LAP 18X18 X RAY DECT (DISPOSABLE) ×4 IMPLANT
SWAB COLLECTION DEVICE MRSA (MISCELLANEOUS) IMPLANT
TOWEL OR 17X24 6PK STRL BLUE (TOWEL DISPOSABLE) IMPLANT
TOWEL OR 17X26 10 PK STRL BLUE (TOWEL DISPOSABLE) ×2 IMPLANT
TUBE ANAEROBIC SPECIMEN COL (MISCELLANEOUS) IMPLANT
TUBE CONNECTING 12X1/4 (SUCTIONS) ×2 IMPLANT
UNDERPAD 30X30 (UNDERPADS AND DIAPERS) ×2 IMPLANT
WATER STERILE IRR 1000ML POUR (IV SOLUTION) IMPLANT
YANKAUER SUCT BULB TIP NO VENT (SUCTIONS) ×2 IMPLANT

## 2016-02-24 NOTE — Progress Notes (Signed)
PROGRESS NOTE    [name] [name]  IOX:[id number] DOB: 10-Dec-1985 DOA: 02/22/2016 PCP: No PCP Per Patient    Brief Narrative:  30 yo male with T2DM, presents with the chief complain of fever, malaise and low back pain. Worsening pain at the site of gluteal wound, which has been draining bloody fluid. On the initial examination, he was afebrile, dehydrated, gluteal wound with erythema and edema. Noted leukocytosis and DKA. Admitted with working diagnosis of sepsis. Started on broad spectrum antibiotics, insulin infusion and surgery consulted. Plan for debridement today. Glucose still uncontrolled.    Assessment & Plan:   Principal Problem:   Sepsis, unspecified organism [facility]) Active Problems:   Uncontrolled diabetes mellitus ([place])   Normocytic anemia   Hypertension   Cellulitis   AKI (acute kidney injury) ([place])   DKA (diabetic ketoacidoses) ([place])    1. Sepsis due to cellulitis, present on admission. Continue to follow a restrictive IV fluids strategy, will resume gentle hydration. Continue antibiotic therapy with zosyn and vancomycin. WBC down to 32 from 36. Cultures no growth, today for debridement for source control. Blood pressure 268 systolic.   2. DKA.  Persistent elevation of glucose, up to 300, will continue current basal insulin and will increase dose of insilun sliding scale, will continue to calculate insulin requirements, this am patient is npo for procedure. Will place on 70/30 insulin close to discharge when infection is better controlled.   3. Ulcerated wound with cellulitis.  IV antibiotics, local wound care, will go for debridement today, for source control. Noted necrotic tissue, unable to stage lesion. Will add morphine IV for pain control.     4. AKI with hypokalemia. Stable renal function with cr at 0.89 with k at 3,7 and Na at 129 with serum bicarb at 23 and cl at 96, will start patient on NS at 75 cc for hydration and follow on renal panel in am.   DVT  prophylaxis: lovenox  Code Status: full  Family Communication: no family at the bedside  Disposition Plan: home   Consultants:   Surgery    Antimicrobials:   Vancomycin  Zosyn     Subjective: Patient with severe pain at the gluteal wound, sharp, 10/10, non radiating, worse to touch, mild improvement with oral analgesics. No nausea or vomiting, no abdominal pain, tolerating po well. NPO since midnight.   Objective: Vitals:   02/24/16 0000 02/24/16 0200 02/24/16 0300 02/24/16 0400  BP: 109/68 (!) 109/53 (!) 109/53 109/62  Pulse: (!) 103 (!) 107 (!) 104 (!) 103  Resp: (!) 22 20 (!) 24 20  Temp: 98.8 F (37.1 C)  98.7 F (37.1 C)   TempSrc: Oral  Oral   SpO2: 99% 97% 97% 95%  Weight:      Height:        Intake/Output Summary (Last 24 hours) at 02/24/16 0744 Last data filed at 02/24/16 0700  Gross per 24 hour  Intake          2240.16 ml  Output             2700 ml  Net          -459.84 ml   Filed Weights   02/23/16 0520  Weight: 98.7 kg (217 lb 9.5 oz)    Examination:  General exam: in pain, no dyspnea E ENT: no pallor or icterus, oral mucosa moist. Respiratory system: Clear to auscultation. Respiratory effort normal. No wheezing, rales or rhonchi.  Cardiovascular system: S1 & S2 heard, RRR. No  JVD, murmurs, rubs, gallops or clicks. No pedal edema. Gastrointestinal system: Abdomen is nondistended, soft and nontender. No organomegaly or masses felt. Normal bowel sounds heard. Central nervous system: Alert and oriented. No focal neurological deficits. Extremities: Symmetric 5 x 5 power. Skin: sacrum ulcerated ulcer with erythematous border.   Data Reviewed: I have personally reviewed following labs and imaging studies  CBC:  Recent Labs Lab 02/22/16 1301 02/24/16 0244  WBC 36.1* 32.1*  NEUTROABS  --  27.7*  HGB 12.7* 10.9*  HCT 35.6* 30.5*  MCV 83.6 84.0  PLT 394 480   Basic Metabolic Panel:  Recent Labs Lab 02/23/16 0033 02/23/16 0215  02/23/16 0557 02/23/16 1601 02/24/16 0244  NA 119* 131* 129* 128* 129*  K 3.0* 3.4* 3.3* 4.0 3.7  CL 91* 101 98* 99* 96*  CO2 21* 22 22 22 23   GLUCOSE 580* 158* 145* 319* 374*  BUN 14 15 13 9 9   CREATININE 0.96 0.98 0.90 0.89 0.89  CALCIUM 7.5* 8.3* 8.1* 8.1* 8.3*   GFR: Estimated Creatinine Clearance: 137 mL/min (by C-G formula based on SCr of 0.89 mg/dL). Liver Function Tests: No results for input(s): AST, ALT, ALKPHOS, BILITOT, PROT, ALBUMIN in the last 168 hours. No results for input(s): LIPASE, AMYLASE in the last 168 hours. No results for input(s): AMMONIA in the last 168 hours. Coagulation Profile:  Recent Labs Lab 02/23/16 0033  INR 1.12   Cardiac Enzymes:  Recent Labs Lab 02/23/16 0215  TROPONINI 0.17*   BNP (last 3 results) No results for input(s): PROBNP in the last 8760 hours. HbA1C:  Recent Labs  02/23/16 0033  HGBA1C 13.7*   CBG:  Recent Labs Lab 02/23/16 0952 02/23/16 1059 02/23/16 1151 02/23/16 1718 02/23/16 2237  GLUCAP 111* 237* 182* 306* 394*   Lipid Profile: No results for input(s): CHOL, HDL, LDLCALC, TRIG, CHOLHDL, LDLDIRECT in the last 72 hours. Thyroid Function Tests: No results for input(s): TSH, T4TOTAL, FREET4, T3FREE, THYROIDAB in the last 72 hours. Anemia Panel: No results for input(s): VITAMINB12, FOLATE, FERRITIN, TIBC, IRON, RETICCTPCT in the last 72 hours. Sepsis Labs:  Recent Labs Lab 02/22/16 1606 02/22/16 2054 02/23/16 0033  PROCALCITON  --   --  0.98  LATICACIDVEN 2.50* 2.27* 1.7    Recent Results (from the past 240 hour(s))  Culture, blood (routine x 2)     Status: None   Collection Time: 02/16/16  8:00 PM  Result Value Ref Range Status   Specimen Description BLOOD RIGHT ARM  Final   Special Requests AEROBIC BOTTLE ONLY 5CC  Final   Culture   Final    NO GROWTH 5 DAYS Performed at [facility]    Report Status 02/21/2016 FINAL  Final  Culture, blood (routine x 2)     Status: None    Collection Time: 02/16/16  9:44 PM  Result Value Ref Range Status   Specimen Description BLOOD LEFT HAND  Final   Special Requests BOTTLES DRAWN AEROBIC AND ANAEROBIC 5ML  Final   Culture   Final    NO GROWTH 5 DAYS Performed at [facility]    Report Status 02/21/2016 FINAL  Final  Blood Culture (routine x 2)     Status: None (Preliminary result)   Collection Time: 02/22/16  3:19 PM  Result Value Ref Range Status   Specimen Description BLOOD LEFT HAND  Final   Special Requests BOTTLES DRAWN AEROBIC AND ANAEROBIC 5CC  Final   Culture NO GROWTH < 24 HOURS  Final  Report Status PENDING  Incomplete  Blood Culture (routine x 2)     Status: None (Preliminary result)   Collection Time: 02/22/16  3:24 PM  Result Value Ref Range Status   Specimen Description BLOOD RIGHT HAND  Final   Special Requests BOTTLES DRAWN AEROBIC AND ANAEROBIC 5CC  Final   Culture NO GROWTH < 24 HOURS  Final   Report Status PENDING  Incomplete  Aerobic Culture (superficial specimen)     Status: None (Preliminary result)   Collection Time: 02/22/16  6:36 PM  Result Value Ref Range Status   Specimen Description WOUND SACRAL  Final   Special Requests NONE  Final   Gram Stain NO WBC SEEN MODERATE GRAM POSITIVE COCCI   Final   Culture CULTURE REINCUBATED FOR BETTER GROWTH  Final   Report Status PENDING  Incomplete  MRSA PCR Screening     Status: None   Collection Time: 02/23/16  5:26 AM  Result Value Ref Range Status   MRSA by PCR NEGATIVE NEGATIVE Final    Comment:        The GeneXpert MRSA Assay (FDA approved for NASAL specimens only), is one component of a comprehensive MRSA colonization surveillance program. It is not intended to diagnose MRSA infection nor to guide or monitor treatment for MRSA infections.          Radiology Studies: Ct Pelvis W Contrast  Result Date: 02/22/2016 CLINICAL DATA:  Sacral wound, status post drainage x2 EXAM: CT PELVIS WITH CONTRAST TECHNIQUE:  Multidetector CT imaging of the pelvis was performed using the standard protocol following the bolus administration of intravenous contrast. CONTRAST:  134mL ISOVUE-300 IOPAMIDOL (ISOVUE-300) INJECTION 61% COMPARISON:  02/16/2016 FINDINGS: Urinary Tract:  Bladder is within normal limits. Bowel: Visualized bowel is unremarkable. Specifically, the rectum is within normal limits. Vascular/Lymphatic: Iliac arteries are within normal limits. No suspicious pelvic lymphadenopathy. Reproductive:  Prostate is within normal limits. Other:  No pelvic ascites. Subcutaneous gas overlying the coccyx (series 2/image 30) and right gluteal region (series 2/ image 20). Associated subcutaneous stranding. Minimal subcutaneous fluid may be present (series 2/ image 33), but this is not reflect a well-defined fluid collection/abscess. Minimal fat within the right inguinal canal. Musculoskeletal: No focal osseous lesions. IMPRESSION: Subcutaneous gas overlying the coccyx and right gluteal region. Differential considerations include postprocedural change versus infection related to gas-forming organism. Minimal subcutaneous fluid with stranding, favored to reflect cellulitis with phlegmonous change. No well-defined fluid collection/abscess. Electronically Signed   By: Julian Hy M.D.   On: 02/22/2016 16:35   Dg Chest Port 1 View  Result Date: 02/22/2016 CLINICAL DATA:  Weakness and dyspnea.  Sepsis. EXAM: PORTABLE CHEST 1 VIEW COMPARISON:  None. FINDINGS: A single AP portable view of the chest demonstrates no focal airspace consolidation or alveolar edema. The lungs are grossly clear. There is no large effusion or pneumothorax. Cardiac and mediastinal contours appear unremarkable. IMPRESSION: No active disease. Electronically Signed   By: Andreas Newport M.D.   On: 02/22/2016 23:03        Scheduled Meds: . enoxaparin (LOVENOX) injection  40 mg Subcutaneous Q24H  . insulin aspart  0-20 Units Subcutaneous TID WC  .  insulin aspart  0-5 Units Subcutaneous QHS  . insulin glargine  10 Units Subcutaneous BID  . multivitamin with minerals  1 tablet Oral Daily  . pantoprazole  40 mg Oral Daily  . piperacillin-tazobactam (ZOSYN)  IV  3.375 g Intravenous Q8H  . vancomycin  1,000 mg Intravenous Q8H  Continuous Infusions:   LOS: 2 days     Mauricio Gerome Apley, MD [facility] Pager [phone]  If 7PM-7AM, please contact night-coverage [email or web address] Password [facility] 02/24/2016, 7:44 AM

## 2016-02-24 NOTE — Anesthesia Postprocedure Evaluation (Signed)
Anesthesia Post Note  Patient: [name] [name]  Procedure(s) Performed: Procedure(s) (LRB): SACRAL WOUND DEBRIDEMENT (N/A)  Patient location during evaluation: PACU Anesthesia Type: General Level of consciousness: awake and alert Pain management: pain level controlled Vital Signs Assessment: post-procedure vital signs reviewed and stable Respiratory status: spontaneous breathing, nonlabored ventilation, respiratory function stable and patient connected to nasal cannula oxygen Cardiovascular status: blood pressure returned to baseline and stable Postop Assessment: no signs of nausea or vomiting Anesthetic complications: no    Last Vitals:  Vitals:   02/24/16 1145 02/24/16 1200  BP: 129/82 127/82  Pulse: 97 96  Resp: 14 15  Temp: 36.4 C     Last Pain:  Vitals:   02/24/16 1145  TempSrc:   PainSc: Asleep                 Effie Berkshire

## 2016-02-24 NOTE — Transfer of Care (Signed)
Immediate Anesthesia Transfer of Care Note  Patient: [name] [name]  Procedure(s) Performed: Procedure(s): SACRAL WOUND DEBRIDEMENT (N/A)  Patient Location: PACU  Anesthesia Type:General  Level of Consciousness: awake, alert  and oriented  Airway & Oxygen Therapy: Patient Spontanous Breathing and Patient connected to nasal cannula oxygen  Post-op Assessment: Report given to RN and Post -op Vital signs reviewed and stable  Post vital signs: Reviewed and stable  Last Vitals:  Vitals:   02/24/16 0753 02/24/16 1101  BP: 119/70 121/81  Pulse: 99   Resp: (!) 22   Temp: 37.1 C 36.4 C    Last Pain:  Vitals:   02/24/16 0753  TempSrc: Oral  PainSc: 7       Patients Stated Pain Goal: 2 (09/16/14 0109)  Complications: No apparent anesthesia complications

## 2016-02-24 NOTE — Op Note (Signed)
02/22/2016 - 02/24/2016  10:50 AM  PATIENT:  [name] [name]  30 y.o. male  PRE-OPERATIVE DIAGNOSIS:  SACRAL WOUND  POST-OPERATIVE DIAGNOSIS:  SACRAL WOUND  PROCEDURE:  Procedure(s): SACRAL WOUND DEBRIDEMENT  SURGEON:  Surgeon(s): Georganna Skeans, MD  ASSISTANTS: none   ANESTHESIA:   general  EBL: 10cc BLOOD ADMINISTERED:none  DRAINS: none   SPECIMEN:  Excision  DISPOSITION OF SPECIMEN:  PATHOLOGY  COUNTS:  YES  DICTATION: .Dragon Dictation 1.  Procedure in detail: Lehman is brought for debridement of sacral wound. He was brought to the operating room after informed consent was obtained. He is currently on IV antibiotics. General endotracheal anesthesia was administered by the anesthesia staff. He was placed in prone position with appropriate padding. Sacral area was prepped and draped in a sterile fashion. Timeout procedure was performed. Cautery was used to make a circular incision to encompass all of the devitalized tissue present in his sacral area. I also continued this down to visible sinus along his midline crease. Subcutaneous tissues were dissected and we entered an area of purulence. This was sent for cultures. The overlying tissue was removed and further debridement was done of the muscle and fascia underlying. There was necrotic infected tissue present and we had to enlarge the initial incision in order to debrided back completely to viable tissue. There was still some fibrinous exudate on the remaining fascia. All clearly nonviable and infected tissue was removed. Hemostasis was obtained with cautery. It was irrigated thoroughly. Wound was then packed with a saline soaked Kerlix followed by sterile gauzes. He tolerated the procedure well. All counts were correct. He was taken recovery in stable condition.  2.  Tool used for debridement (curette, scapel, etc.) cautery  3.  Frequency of surgical debridement.   First time  4.  Measurement of total devitalized tissue (wound  surface) before and after surgical debridement.   9 cm x 6 cm x 3 cm deep  5.  Area and depth of devitalized tissue removed from wound. 9 cm x 6 cm x 3 cm deep  6.  Blood loss and description of tissue removed.  10 mL, necrotic tissue with purulent drainage  7.  Evidence of the progress of the wound's response to treatment.  A.  Current wound volume (current dimensions and depth).  9 cm x 6 cm x 3 cm deep  B.  Presence (and extent of) of infection.  Present  C.  Presence (and extent of) of non viable tissue.  Present  D.  Other material in the wound that is expected to inhibit healing.  none  8.  Was there any viable tissue removed (measurements): Minimal  PATIENT DISPOSITION:  PACU - hemodynamically stable.   Delay start of Pharmacological VTE agent (>24hrs) due to surgical blood loss or risk of bleeding:  no  Georganna Skeans, MD, MPH, FACS Pager: [phone]  12/8/201710:50 AM

## 2016-02-24 NOTE — Anesthesia Preprocedure Evaluation (Addendum)
Anesthesia Evaluation  Patient identified by MRN, date of birth, ID band Patient awake    Reviewed: Allergy & Precautions, NPO status , Patient's Chart, lab work & pertinent test results  Airway Mallampati: I  TM Distance: >3 FB Neck ROM: Full    Dental  (+) Teeth Intact, Dental Advisory Given   Pulmonary asthma ,    breath sounds clear to auscultation       Cardiovascular hypertension, Pt. on medications  Rhythm:Regular Rate:Normal     Neuro/Psych negative neurological ROS  negative psych ROS   GI/Hepatic negative GI ROS, Neg liver ROS,   Endo/Other  diabetes, Type 2, Insulin Dependent, Oral Hypoglycemic Agents  Renal/GU Renal disease  negative genitourinary   Musculoskeletal negative musculoskeletal ROS (+)   Abdominal   Peds negative pediatric ROS (+)  Hematology negative hematology ROS (+)   Anesthesia Other Findings   Reproductive/Obstetrics negative OB ROS                            Lab Results  Component Value Date   WBC 32.1 (H) 02/24/2016   HGB 10.9 (L) 02/24/2016   HCT 30.5 (L) 02/24/2016   MCV 84.0 02/24/2016   PLT 310 02/24/2016   Lab Results  Component Value Date   CREATININE 0.89 02/24/2016   BUN 9 02/24/2016   NA 129 (L) 02/24/2016   K 3.7 02/24/2016   CL 96 (L) 02/24/2016   CO2 23 02/24/2016   Lab Results  Component Value Date   INR 1.12 02/23/2016   EKG: normal sinus rhythm.  Anesthesia Physical Anesthesia Plan  ASA: III  Anesthesia Plan: General   Post-op Pain Management:    Induction: Intravenous  Airway Management Planned: Oral ETT  Additional Equipment:   Intra-op Plan:   Post-operative Plan: Extubation in OR  Informed Consent: I have reviewed the patients History and Physical, chart, labs and discussed the procedure including the risks, benefits and alternatives for the proposed anesthesia with the patient or authorized representative  who has indicated his/her understanding and acceptance.   Dental advisory given  Plan Discussed with: CRNA  Anesthesia Plan Comments:         Anesthesia Quick Evaluation

## 2016-02-24 NOTE — Progress Notes (Signed)
Subjective: Pain at sacral area  Objective: Vital signs in last 24 hours: Temp:  [98.1 F (36.7 C)-98.8 F (37.1 C)] 98.7 F (37.1 C) (12/08 0300) Pulse Rate:  [95-109] 103 (12/08 0400) Resp:  [17-24] 20 (12/08 0400) BP: (99-129)/(52-82) 109/62 (12/08 0400) SpO2:  [94 %-99 %] 95 % (12/08 0400) Last BM Date: 02/22/16  Intake/Output from previous day: 12/07 0701 - 12/08 0700 In: 2240.2 [P.O.:960; I.V.:15.2; IV Piggyback:1265] Out: 2700 [Urine:2700] Intake/Output this shift: No intake/output data recorded.  General appearance: alert Resp: clear to auscultation bilaterally Skin: sacral wound with eschar and mild cellulitis  Lab Results:   Recent Labs  02/22/16 1301 02/24/16 0244  WBC 36.1* 32.1*  HGB 12.7* 10.9*  HCT 35.6* 30.5*  PLT 394 310   BMET  Recent Labs  02/23/16 1601 02/24/16 0244  NA 128* 129*  K 4.0 3.7  CL 99* 96*  CO2 22 23  GLUCOSE 319* 374*  BUN 9 9  CREATININE 0.89 0.89  CALCIUM 8.1* 8.3*   PT/INR  Recent Labs  02/23/16 0033  LABPROT 14.5  INR 1.12   ABG  Recent Labs  02/22/16 1606  HCO3 20.5    Studies/Results: Ct Pelvis W Contrast  Result Date: 02/22/2016 CLINICAL DATA:  Sacral wound, status post drainage x2 EXAM: CT PELVIS WITH CONTRAST TECHNIQUE: Multidetector CT imaging of the pelvis was performed using the standard protocol following the bolus administration of intravenous contrast. CONTRAST:  149mL ISOVUE-300 IOPAMIDOL (ISOVUE-300) INJECTION 61% COMPARISON:  02/16/2016 FINDINGS: Urinary Tract:  Bladder is within normal limits. Bowel: Visualized bowel is unremarkable. Specifically, the rectum is within normal limits. Vascular/Lymphatic: Iliac arteries are within normal limits. No suspicious pelvic lymphadenopathy. Reproductive:  Prostate is within normal limits. Other:  No pelvic ascites. Subcutaneous gas overlying the coccyx (series 2/image 30) and right gluteal region (series 2/ image 20). Associated subcutaneous  stranding. Minimal subcutaneous fluid may be present (series 2/ image 33), but this is not reflect a well-defined fluid collection/abscess. Minimal fat within the right inguinal canal. Musculoskeletal: No focal osseous lesions. IMPRESSION: Subcutaneous gas overlying the coccyx and right gluteal region. Differential considerations include postprocedural change versus infection related to gas-forming organism. Minimal subcutaneous fluid with stranding, favored to reflect cellulitis with phlegmonous change. No well-defined fluid collection/abscess. Electronically Signed   By: Julian Hy M.D.   On: 02/22/2016 16:35   Dg Chest Port 1 View  Result Date: 02/22/2016 CLINICAL DATA:  Weakness and dyspnea.  Sepsis. EXAM: PORTABLE CHEST 1 VIEW COMPARISON:  None. FINDINGS: A single AP portable view of the chest demonstrates no focal airspace consolidation or alveolar edema. The lungs are grossly clear. There is no large effusion or pneumothorax. Cardiac and mediastinal contours appear unremarkable. IMPRESSION: No active disease. Electronically Signed   By: Andreas Newport M.D.   On: 02/22/2016 23:03    Anti-infectives: Anti-infectives    Start     Dose/Rate Route Frequency Ordered Stop   02/23/16 0600  piperacillin-tazobactam (ZOSYN) IVPB 3.375 g     3.375 g 12.5 mL/hr over 240 Minutes Intravenous Every 8 hours 02/22/16 2238     02/23/16 0600  vancomycin (VANCOCIN) IVPB 1000 mg/200 mL premix     1,000 mg 200 mL/hr over 60 Minutes Intravenous Every 8 hours 02/22/16 2238     02/22/16 2230  piperacillin-tazobactam (ZOSYN) IVPB 3.375 g     3.375 g 100 mL/hr over 30 Minutes Intravenous  Once 02/22/16 2227 02/22/16 2339   02/22/16 2230  vancomycin (VANCOCIN) IVPB 1000 mg/200 mL  premix     1,000 mg 200 mL/hr over 60 Minutes Intravenous  Once 02/22/16 2227 02/23/16 0058   02/22/16 1730  clindamycin (CLEOCIN) IVPB 600 mg     600 mg 100 mL/hr over 30 Minutes Intravenous  Once 02/22/16 1716 02/22/16 1952    02/22/16 1530  ceFAZolin (ANCEF) IVPB 1 g/50 mL premix     1 g 100 mL/hr over 30 Minutes Intravenous  Once 02/22/16 1522 02/22/16 1615      Assessment/Plan: 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 To OR this AM for debridement. Procedure, risks, and benefits D/W him. I discussed the expected post-op course and answered his questions.  ID - on Vanc/Zosyn  DKA/hyponatremia - improving with medical management. I spoke with Dr. Cathlean Sauer at the bedside.  LOS: 2 days    Adiana Smelcer E 02/24/2016

## 2016-02-24 NOTE — Anesthesia Procedure Notes (Signed)
Procedure Name: Intubation Date/Time: 02/24/2016 10:06 AM Performed by: Clearnce Sorrel Pre-anesthesia Checklist: Patient identified, Emergency Drugs available, Suction available, Patient being monitored and Timeout performed Patient Re-evaluated:Patient Re-evaluated prior to inductionOxygen Delivery Method: Circle system utilized Preoxygenation: Pre-oxygenation with 100% oxygen Intubation Type: IV induction Ventilation: Mask ventilation without difficulty Laryngoscope Size: Mac and 3 Grade View: Grade I Tube type: Oral Tube size: 7.5 mm Number of attempts: 1 Airway Equipment and Method: Stylet Placement Confirmation: ETT inserted through vocal cords under direct vision,  positive ETCO2 and breath sounds checked- equal and bilateral Secured at: 23 cm Tube secured with: Tape Dental Injury: Teeth and Oropharynx as per pre-operative assessment

## 2016-02-24 NOTE — Progress Notes (Signed)
Pharmacy Antibiotic Note  [name] [name] is a 30 y.o. male admitted on 02/22/2016 with sepsis and cellulitis.  Pharmacy has been consulted for vancomycin and zosyn dosing.  Vancomycin level 39, this is very unexpected given age of 40 and normal renal function. Will recheck level when next dose was to be due. Dose charted prior to lab drawn but nurse confirmed that patient only received ~90ml prior to receiving critical lab alert.   Plan: Recheck vancomycin random in am  Height: 5\' 7"  (170.2 cm) Weight: 217 lb 9.5 oz (98.7 kg) IBW/kg (Calculated) : 66.1  Temp (24hrs), Avg:98.5 F (36.9 C), Min:97.6 F (36.4 C), Max:99.3 F (37.4 C)   Recent Labs Lab 02/22/16 1301 02/22/16 1606 02/22/16 2054 02/23/16 0033 02/23/16 0215 02/23/16 0557 02/23/16 1601 02/24/16 0244 02/24/16 2145  WBC 36.1*  --   --   --   --   --   --  32.1*  --   CREATININE 1.29*  --   --  0.96 0.98 0.90 0.89 0.89  --   LATICACIDVEN  --  2.50* 2.27* 1.7  --   --   --   --   --   VANCOTROUGH  --   --   --   --   --   --   --   --  39*    Estimated Creatinine Clearance: 137 mL/min (by C-G formula based on SCr of 0.89 mg/dL).    No Known Allergies  Erin Hearing PharmD., BCPS Clinical Pharmacist Pager [phone] 02/24/2016 11:00 PM

## 2016-02-25 ENCOUNTER — Encounter (HOSPITAL_COMMUNITY): Payer: Self-pay | Admitting: General Surgery

## 2016-02-25 LAB — GLUCOSE, CAPILLARY
GLUCOSE-CAPILLARY: 301 mg/dL — AB (ref 65–99)
GLUCOSE-CAPILLARY: 446 mg/dL — AB (ref 65–99)
GLUCOSE-CAPILLARY: 353 mg/dL — AB (ref 65–99)
Glucose-Capillary: 401 mg/dL — ABNORMAL HIGH (ref 65–99)
Glucose-Capillary: 375 mg/dL — ABNORMAL HIGH (ref 65–99)

## 2016-02-25 LAB — CBC WITH DIFFERENTIAL/PLATELET
BASOS ABS: 0.2 10*3/uL — AB (ref 0.0–0.1)
Basophils Relative: 1 %
EOS ABS: 0 10*3/uL (ref 0.0–0.7)
EOS PCT: 0 %
HCT: 30.2 % — ABNORMAL LOW (ref 39.0–52.0)
Hemoglobin: 10.3 g/dL — ABNORMAL LOW (ref 13.0–17.0)
Lymphocytes Relative: 12 %
Lymphs Abs: 2.6 10*3/uL (ref 0.7–4.0)
MCH: 29.6 pg (ref 26.0–34.0)
MCHC: 34.1 g/dL (ref 30.0–36.0)
MCV: 86.8 fL (ref 78.0–100.0)
MONO ABS: 1.5 10*3/uL — AB (ref 0.1–1.0)
Monocytes Relative: 7 %
NEUTROS PCT: 80 %
Neutro Abs: 17.5 10*3/uL — ABNORMAL HIGH (ref 1.7–7.7)
PLATELETS: 260 10*3/uL (ref 150–400)
RBC: 3.48 MIL/uL — AB (ref 4.22–5.81)
RDW: 12.7 % (ref 11.5–15.5)
WBC: 21.8 10*3/uL — AB (ref 4.0–10.5)

## 2016-02-25 LAB — BASIC METABOLIC PANEL
ANION GAP: 5 (ref 5–15)
BUN: 9 mg/dL (ref 6–20)
CALCIUM: 8 mg/dL — AB (ref 8.9–10.3)
CO2: 31 mmol/L (ref 22–32)
Chloride: 96 mmol/L — ABNORMAL LOW (ref 101–111)
Creatinine, Ser: 0.74 mg/dL (ref 0.61–1.24)
Glucose, Bld: 311 mg/dL — ABNORMAL HIGH (ref 65–99)
POTASSIUM: 3.5 mmol/L (ref 3.5–5.1)
SODIUM: 132 mmol/L — AB (ref 135–145)

## 2016-02-25 LAB — VANCOMYCIN, RANDOM: Vancomycin Rm: 15

## 2016-02-25 MED ORDER — VANCOMYCIN HCL IN DEXTROSE 1-5 GM/200ML-% IV SOLN
1000.0000 mg | Freq: Two times a day (BID) | INTRAVENOUS | Status: DC
  Administered 2016-02-25 – 2016-02-28 (×7): 1000 mg via INTRAVENOUS
  Filled 2016-02-25 (×11): qty 200

## 2016-02-25 MED ORDER — SILVER NITRATE-POT NITRATE 75-25 % EX MISC: 10.0000 | Freq: Once | CUTANEOUS | Status: DC

## 2016-02-25 MED ORDER — POTASSIUM CHLORIDE CRYS ER 20 MEQ PO TBCR
40.0000 meq | EXTENDED_RELEASE_TABLET | Freq: Once | ORAL | Status: AC
  Administered 2016-02-25: 40 meq via ORAL
  Filled 2016-02-25: qty 2

## 2016-02-25 MED ORDER — INSULIN GLARGINE 100 UNIT/ML ~~LOC~~ SOLN
20.0000 [IU] | Freq: Two times a day (BID) | SUBCUTANEOUS | Status: DC
  Administered 2016-02-25 (×2): 20 [IU] via SUBCUTANEOUS
  Filled 2016-02-25 (×4): qty 0.2

## 2016-02-25 MED ORDER — SILVER NITRATE-POT NITRATE 75-25 % EX MISC: 10.0000 | CUTANEOUS | Status: DC

## 2016-02-25 NOTE — Progress Notes (Signed)
PROGRESS NOTE    [name] [name]  BMW:[id number] DOB: 06-30-1985 DOA: 02/22/2016 PCP: No PCP Per Patient    Brief Narrative:  30 yo male with T2DM, presents with the chief complain of fever, malaise and low back pain. Worsening pain at the site of gluteal wound, which has been draining bloody fluid. On the initial examination, he was afebrile, dehydrated, gluteal wound with erythema and edema. Noted leukocytosis and DKA. Admitted with working diagnosis of sepsis. Started on broad spectrum antibiotics, insulin infusion and surgery consulted. SP debridement (12/08). Glucose still uncontrolled, adjusting insulin regimen.    Assessment & Plan:   Principal Problem:   Sepsis, unspecified organism [facility]) Active Problems:   Uncontrolled diabetes mellitus ([place])   Normocytic anemia   Hypertension   Cellulitis   AKI (acute kidney injury) ([place])   DKA (diabetic ketoacidoses) ([place])    1. Sepsis due to cellulitis, present on admission. Continue gentle hydration with isotonic saline at 75 cc/hr. Antibiotic therapy with zosyn and vancomycin. WBC down to 21 from 32. Cultures continue  no growth.  2. T2dm with resolved DKA.  Persistent elevation of glucose, up to 400, will continue increase basal insulin to 20 units bid and continue glucose monitoring q 4 hours. Cover with short acting insilun sliding scale and continue to calculate requirements. No nausea or vomiting, patient tolerating po well. No elevation in anion gap.   3. Ulcerated wound with cellulitis.  IV antibiotics, sp debridement today, for source control. Will continue pain control and postsurgical care. Vancomycin level 15 random, follow on pharmacy protocol.   4. AKI with hypokalemia. Renal function with cr at 0.74 with k at 3,5 and Na at 132 with serum bicarb at 31 and cl at 96, continue isotonic saline at  75 cc/ hour.  Replete K with kcl and follow renal panel in am.    DVT prophylaxis:lovenox  Code Status:full  Family  Communication:no family at the bedside  Disposition Plan:home   Consultants:  Surgery    Antimicrobials:   Vancomycin  Zosyn        Subjective: Patient with pain in the sacrum, improved from yesterday. No nausea or vomiting, tolerating po well. Had debridement in the OR.   Objective: Vitals:   02/24/16 2342 02/25/16 0000 02/25/16 0400 02/25/16 0402  BP: 115/64 105/62 129/81 129/81  Pulse: 95 91 93 92  Resp: (!) 22  (!) 9 17  Temp: 98.7 F (37.1 C)   97.7 F (36.5 C)  TempSrc: Oral   Oral  SpO2: 96% 94%  96%  Weight:    97.9 kg (215 lb 13.3 oz)  Height:        Intake/Output Summary (Last 24 hours) at 02/25/16 0810 Last data filed at 02/25/16 0600  Gross per 24 hour  Intake           2857.5 ml  Output             1710 ml  Net           1147.5 ml   Filed Weights   02/23/16 0520 02/25/16 0402  Weight: 98.7 kg (217 lb 9.5 oz) 97.9 kg (215 lb 13.3 oz)    Examination:  General exam: deconditioned, mild pain, no dyspnea E ENT: no pallor or icterus Respiratory system: no wheezing, rales or rhonchi. Respiratory effort normal. Cardiovascular system: S1 & S2 heard, RRR. No JVD, murmurs, rubs, gallops or clicks. No pedal edema. Gastrointestinal system: Abdomen is nondistended, soft and nontender. No organomegaly or masses  felt. Normal bowel sounds heard. Central nervous system: Alert and oriented. No focal neurological deficits. Extremities: Symmetric 5 x 5 power. Skin: surgical dressing in place.  Data Reviewed: I have personally reviewed following labs and imaging studies  CBC:  Recent Labs Lab 02/22/16 1301 02/24/16 0244 02/25/16 0640  WBC 36.1* 32.1* 21.8*  NEUTROABS  --  27.7* PENDING  HGB 12.7* 10.9* 10.3*  HCT 35.6* 30.5* 30.2*  MCV 83.6 84.0 86.8  PLT 394 310 778   Basic Metabolic Panel:  Recent Labs Lab 02/23/16 0215 02/23/16 0557 02/23/16 1601 02/24/16 0244 02/25/16 0640  NA 131* 129* 128* 129* 132*  K 3.4* 3.3* 4.0 3.7 3.5  CL  101 98* 99* 96* 96*  CO2 22 22 22 23 31   GLUCOSE 158* 145* 319* 374* 311*  BUN 15 13 9 9 9   CREATININE 0.98 0.90 0.89 0.89 0.74  CALCIUM 8.3* 8.1* 8.1* 8.3* 8.0*   GFR: Estimated Creatinine Clearance: 151.9 mL/min (by C-G formula based on SCr of 0.74 mg/dL). Liver Function Tests: No results for input(s): AST, ALT, ALKPHOS, BILITOT, PROT, ALBUMIN in the last 168 hours. No results for input(s): LIPASE, AMYLASE in the last 168 hours. No results for input(s): AMMONIA in the last 168 hours. Coagulation Profile:  Recent Labs Lab 02/23/16 0033  INR 1.12   Cardiac Enzymes:  Recent Labs Lab 02/23/16 0215  TROPONINI 0.17*   BNP (last 3 results) No results for input(s): PROBNP in the last 8760 hours. HbA1C:  Recent Labs  02/23/16 0033  HGBA1C 13.7*   CBG:  Recent Labs Lab 02/24/16 0845 02/24/16 1111 02/24/16 1651 02/24/16 2054 02/25/16 0408  GLUCAP 327* 281* 401* 338* 301*   Lipid Profile: No results for input(s): CHOL, HDL, LDLCALC, TRIG, CHOLHDL, LDLDIRECT in the last 72 hours. Thyroid Function Tests: No results for input(s): TSH, T4TOTAL, FREET4, T3FREE, THYROIDAB in the last 72 hours. Anemia Panel: No results for input(s): VITAMINB12, FOLATE, FERRITIN, TIBC, IRON, RETICCTPCT in the last 72 hours. Sepsis Labs:  Recent Labs Lab 02/22/16 1606 02/22/16 2054 02/23/16 0033  PROCALCITON  --   --  0.98  LATICACIDVEN 2.50* 2.27* 1.7    Recent Results (from the past 240 hour(s))  Culture, blood (routine x 2)     Status: None   Collection Time: 02/16/16  8:00 PM  Result Value Ref Range Status   Specimen Description BLOOD RIGHT ARM  Final   Special Requests AEROBIC BOTTLE ONLY 5CC  Final   Culture   Final    NO GROWTH 5 DAYS Performed at [facility]    Report Status 02/21/2016 FINAL  Final  Culture, blood (routine x 2)     Status: None   Collection Time: 02/16/16  9:44 PM  Result Value Ref Range Status   Specimen Description BLOOD LEFT HAND  Final    Special Requests BOTTLES DRAWN AEROBIC AND ANAEROBIC 5ML  Final   Culture   Final    NO GROWTH 5 DAYS Performed at [facility]    Report Status 02/21/2016 FINAL  Final  Blood Culture (routine x 2)     Status: None (Preliminary result)   Collection Time: 02/22/16  3:19 PM  Result Value Ref Range Status   Specimen Description BLOOD LEFT HAND  Final   Special Requests BOTTLES DRAWN AEROBIC AND ANAEROBIC 5CC  Final   Culture NO GROWTH 2 DAYS  Final   Report Status PENDING  Incomplete  Blood Culture (routine x 2)     Status:  None (Preliminary result)   Collection Time: 02/22/16  3:24 PM  Result Value Ref Range Status   Specimen Description BLOOD RIGHT HAND  Final   Special Requests BOTTLES DRAWN AEROBIC AND ANAEROBIC 5CC  Final   Culture NO GROWTH 2 DAYS  Final   Report Status PENDING  Incomplete  Aerobic Culture (superficial specimen)     Status: None   Collection Time: 02/22/16  6:36 PM  Result Value Ref Range Status   Specimen Description WOUND SACRAL  Final   Special Requests NONE  Final   Gram Stain NO WBC SEEN MODERATE GRAM POSITIVE COCCI   Final   Culture   Final    MODERATE STREPTOCOCCUS AGALACTIAE TESTING AGAINST S. AGALACTIAE NOT ROUTINELY PERFORMED DUE TO PREDICTABILITY OF AMP/PEN/VAN SUSCEPTIBILITY.    Report Status 02/24/2016 FINAL  Final  MRSA PCR Screening     Status: None   Collection Time: 02/23/16  5:26 AM  Result Value Ref Range Status   MRSA by PCR NEGATIVE NEGATIVE Final    Comment:        The GeneXpert MRSA Assay (FDA approved for NASAL specimens only), is one component of a comprehensive MRSA colonization surveillance program. It is not intended to diagnose MRSA infection nor to guide or monitor treatment for MRSA infections.   Aerobic Culture (superficial specimen)     Status: None (Preliminary result)   Collection Time: 02/24/16 10:26 AM  Result Value Ref Range Status   Specimen Description WOUND SACRAL  Final   Special Requests SPEC A   Final   Gram Stain   Final    ABUNDANT WBC PRESENT, PREDOMINANTLY PMN ABUNDANT GRAM POSITIVE COCCOBACILLUS    Culture PENDING  Incomplete   Report Status PENDING  Incomplete         Radiology Studies: No results found.      Scheduled Meds: . enoxaparin (LOVENOX) injection  40 mg Subcutaneous Q24H  . insulin aspart  0-20 Units Subcutaneous Q4H  . insulin glargine  20 Units Subcutaneous BID  . multivitamin with minerals  1 tablet Oral Daily  . pantoprazole  40 mg Oral Daily  . piperacillin-tazobactam (ZOSYN)  IV  3.375 g Intravenous Q8H  . potassium chloride  40 mEq Oral Once   Continuous Infusions: . sodium chloride 75 mL/hr at 02/24/16 2047     LOS: 3 days       Tawni Millers, MD [facility] Pager [phone]  If 7PM-7AM, please contact night-coverage [email or web address] Password [email or web address] 02/25/2016, 8:10 AM

## 2016-02-25 NOTE — Progress Notes (Signed)
Patient ID: [name] [name], male   DOB: [birth year], 30 y.o.   MRN: [id number] Called by RN for bleeding from wound. On exam there was bleeding from L lower side wall of wound. Silver nitrate applied with good hemostasis. Wound re-packed. Leave packing in until tomorrow. Georganna Skeans, MD, MPH, FACS Trauma: [phone] General Surgery: [phone]

## 2016-02-25 NOTE — Progress Notes (Signed)
Pharmacy Antibiotic Note  [name] [name] is a 30 y.o. male admitted on 02/22/2016 with sepsis and cellulitis.  Pharmacy has been consulted for vancomycin and zosyn dosing. Vancomycin held after vancomycin trough of 39. Vancomycin redraw this AM was 15 approximately 14 hours since last dose.   Plan: Restart Vancomycin IV at 1g q12h Continue Zosyn IV 3.375g q8h (4hr infusion) Monitor renal function, LOT, clinical course Vancomycin trough at steady state   Height: 5\' 7"  (170.2 cm) Weight: 215 lb 13.3 oz (97.9 kg) IBW/kg (Calculated) : 66.1  Temp (24hrs), Avg:98.2 F (36.8 C), Min:97.6 F (36.4 C), Max:99.3 F (37.4 C)   Recent Labs Lab 02/22/16 1301 02/22/16 1606 02/22/16 2054 02/23/16 0033 02/23/16 0215 02/23/16 0557 02/23/16 1601 02/24/16 0244 02/24/16 2145 02/25/16 0640  WBC 36.1*  --   --   --   --   --   --  32.1*  --  21.8*  CREATININE 1.29*  --   --  0.96 0.98 0.90 0.89 0.89  --  0.74  LATICACIDVEN  --  2.50* 2.27* 1.7  --   --   --   --   --   --   VANCOTROUGH  --   --   --   --   --   --   --   --  50*  --   VANCORANDOM  --   --   --   --   --   --   --   --   --  15    Estimated Creatinine Clearance: 151.9 mL/min (by C-G formula based on SCr of 0.74 mg/dL).    No Known Allergies  Antimicrobials this admission:  Vancomycin 12/6>> Zosyn 12/6>> Cefazolin 12/6 x 1 Clindamycin 12/6 x 1  Dose adjustments this admission:  Vancomycin 1g q8h >> Vancomycin 1g q12h   12/8 VT 39 (Received 69mL of next dose before this resulted)   12/9 VR 15  Microbiology results:  12/6 Wound: strep agalactiae 12/6 Blood: ngtd 12/7 MRSA: neg 12/8 wound (intra-op):  gram pos coccobacillus  Angela Burke, PharmD, BCPS Pharmacy Resident Pager: [phone] 02/25/2016 8:25 AM

## 2016-02-25 NOTE — Progress Notes (Signed)
Dr. Cathlean Sauer notified of [place]. No new orders at this time. Will continue to monitor. Pt refused  to have lab come draw blood for CBG of 441

## 2016-02-25 NOTE — Progress Notes (Signed)
1 Day Post-Op  Subjective: sore  Objective: Vital signs in last 24 hours: Temp:  [97.6 F (36.4 C)-99.3 F (37.4 C)] 97.7 F (36.5 C) (12/09 0402) Pulse Rate:  [91-113] 92 (12/09 0402) Resp:  [9-25] 17 (12/09 0402) BP: (98-129)/(62-90) 129/81 (12/09 0402) SpO2:  [94 %-99 %] 96 % (12/09 0402) Weight:  [97.9 kg (215 lb 13.3 oz)] 97.9 kg (215 lb 13.3 oz) (12/09 0402) Last BM Date: 02/22/16  Intake/Output from previous day: 12/08 0701 - 12/09 0700 In: 2857.5 [P.O.:1720; I.V.:522.5; IV Piggyback:615] Out: 0923 [Urine:1700; Blood:10] Intake/Output this shift: No intake/output data recorded.  Skin: sacral wound with expected drainage, RN changing dressing  Lab Results:   Recent Labs  02/24/16 0244 02/25/16 0640  WBC 32.1* 21.8*  HGB 10.9* 10.3*  HCT 30.5* 30.2*  PLT 310 260   BMET  Recent Labs  02/24/16 0244 02/25/16 0640  NA 129* 132*  K 3.7 3.5  CL 96* 96*  CO2 23 31  GLUCOSE 374* 311*  BUN 9 9  CREATININE 0.89 0.74  CALCIUM 8.3* 8.0*   PT/INR  Recent Labs  02/23/16 0033  LABPROT 14.5  INR 1.12   ABG  Recent Labs  02/22/16 1606  HCO3 20.5    Studies/Results: No results found.  Anti-infectives: Anti-infectives    Start     Dose/Rate Route Frequency Ordered Stop   02/25/16 1000  vancomycin (VANCOCIN) IVPB 1000 mg/200 mL premix     1,000 mg 200 mL/hr over 60 Minutes Intravenous Every 12 hours 02/25/16 0830     02/23/16 0600  piperacillin-tazobactam (ZOSYN) IVPB 3.375 g     3.375 g 12.5 mL/hr over 240 Minutes Intravenous Every 8 hours 02/22/16 2238     02/23/16 0600  vancomycin (VANCOCIN) IVPB 1000 mg/200 mL premix  Status:  Discontinued     1,000 mg 200 mL/hr over 60 Minutes Intravenous Every 8 hours 02/22/16 2238 02/24/16 2302   02/22/16 2230  piperacillin-tazobactam (ZOSYN) IVPB 3.375 g     3.375 g 100 mL/hr over 30 Minutes Intravenous  Once 02/22/16 2227 02/22/16 2339   02/22/16 2230  vancomycin (VANCOCIN) IVPB 1000 mg/200 mL premix      1,000 mg 200 mL/hr over 60 Minutes Intravenous  Once 02/22/16 2227 02/23/16 0058   02/22/16 1730  clindamycin (CLEOCIN) IVPB 600 mg     600 mg 100 mL/hr over 30 Minutes Intravenous  Once 02/22/16 1716 02/22/16 1952   02/22/16 1530  ceFAZolin (ANCEF) IVPB 1 g/50 mL premix     1 g 100 mL/hr over 30 Minutes Intravenous  Once 02/22/16 1522 02/22/16 1615      Assessment/Plan: s/p Procedure(s): SACRAL WOUND DEBRIDEMENT (N/A) POD#1 Wet to dry dressings CXs pending On vanc/zosyn  LOS: 3 days    Meghin Thivierge E 02/25/2016

## 2016-02-26 LAB — CBC WITH DIFFERENTIAL/PLATELET
BASOS ABS: 0.2 10*3/uL — AB (ref 0.0–0.1)
Basophils Relative: 1 %
EOS PCT: 1 %
Eosinophils Absolute: 0.2 10*3/uL (ref 0.0–0.7)
HEMATOCRIT: 28.5 % — AB (ref 39.0–52.0)
HEMOGLOBIN: 9.7 g/dL — AB (ref 13.0–17.0)
LYMPHS PCT: 14 %
Lymphs Abs: 2.8 10*3/uL (ref 0.7–4.0)
MCH: 29.6 pg (ref 26.0–34.0)
MCHC: 34 g/dL (ref 30.0–36.0)
MCV: 86.9 fL (ref 78.0–100.0)
MONOS PCT: 7 %
Monocytes Absolute: 1.4 10*3/uL — ABNORMAL HIGH (ref 0.1–1.0)
NEUTROS ABS: 15.6 10*3/uL — AB (ref 1.7–7.7)
Neutrophils Relative %: 77 %
Platelets: 244 10*3/uL (ref 150–400)
RBC: 3.28 MIL/uL — AB (ref 4.22–5.81)
RDW: 12.4 % (ref 11.5–15.5)
WBC: 20.2 10*3/uL — AB (ref 4.0–10.5)

## 2016-02-26 LAB — BASIC METABOLIC PANEL
ANION GAP: 8 (ref 5–15)
BUN: 9 mg/dL (ref 6–20)
CHLORIDE: 94 mmol/L — AB (ref 101–111)
CO2: 29 mmol/L (ref 22–32)
Calcium: 8 mg/dL — ABNORMAL LOW (ref 8.9–10.3)
Creatinine, Ser: 0.88 mg/dL (ref 0.61–1.24)
GFR calc non Af Amer: >60 mL/min (ref >60)
Glucose, Bld: 335 mg/dL — ABNORMAL HIGH (ref 65–99)
Potassium: 3.9 mmol/L (ref 3.5–5.1)
Sodium: 131 mmol/L — ABNORMAL LOW (ref 135–145)

## 2016-02-26 LAB — GLUCOSE, CAPILLARY
GLUCOSE-CAPILLARY: 361 mg/dL — AB (ref 65–99)
Glucose-Capillary: 290 mg/dL — ABNORMAL HIGH (ref 65–99)
Glucose-Capillary: 222 mg/dL — ABNORMAL HIGH (ref 65–99)
Glucose-Capillary: 210 mg/dL — ABNORMAL HIGH (ref 65–99)

## 2016-02-26 MED ORDER — INSULIN ASPART 100 UNIT/ML ~~LOC~~ SOLN
0.0000 [IU] | SUBCUTANEOUS | Status: DC
  Administered 2016-02-26: 7 [IU] via SUBCUTANEOUS
  Administered 2016-02-26: 20 [IU] via SUBCUTANEOUS
  Administered 2016-02-27: 7 [IU] via SUBCUTANEOUS
  Administered 2016-02-27: 11 [IU] via SUBCUTANEOUS
  Administered 2016-02-27: 4 [IU] via SUBCUTANEOUS
  Administered 2016-02-27: 7 [IU] via SUBCUTANEOUS
  Administered 2016-02-27: 11 [IU] via SUBCUTANEOUS
  Administered 2016-02-27 – 2016-02-28 (×2): 4 [IU] via SUBCUTANEOUS
  Administered 2016-02-28 (×2): 7 [IU] via SUBCUTANEOUS

## 2016-02-26 MED ORDER — INSULIN ASPART 100 UNIT/ML ~~LOC~~ SOLN
10.0000 [IU] | Freq: Three times a day (TID) | SUBCUTANEOUS | Status: DC
  Administered 2016-02-26 – 2016-02-27 (×6): 10 [IU] via SUBCUTANEOUS

## 2016-02-26 MED ORDER — INSULIN GLARGINE 100 UNIT/ML ~~LOC~~ SOLN
25.0000 [IU] | Freq: Two times a day (BID) | SUBCUTANEOUS | Status: DC
  Administered 2016-02-26 – 2016-02-27 (×3): 25 [IU] via SUBCUTANEOUS
  Filled 2016-02-26 (×4): qty 0.25

## 2016-02-26 NOTE — Progress Notes (Signed)
2 Days Post-Op  Subjective: He is still having allot of drainage from the site.  I changed dressing and will increase dressing changes.  Start hydrotherapy.    Objective: Vital signs in last 24 hours: Temp:  [98.1 F (36.7 C)-99.2 F (37.3 C)] 98.1 F (36.7 C) (12/10 0700) Pulse Rate:  [93-112] 112 (12/10 0700) Resp:  [17-19] 19 (12/10 0700) BP: (99-121)/(67-75) 99/67 (12/10 0700) SpO2:  [96 %-99 %] 99 % (12/10 0700) Last BM Date: 02/22/16 2700 IV PO not recorded Urine 1110 TM 99.2, mild tachycardia, BP stable Na 131, glucose 290-446 WBC still 20.2 02/24/16 WOUND SACRAL   Special Requests SPEC A  No anaerobes  NO growth on blood cultures  Gram Stain ABUNDANT WBC PRESENT, PREDOMINANTLY PMN ABUNDANT GRAM POSITIVE COCCOBACILLUS   Culture CULTURE REINCUBATED FOR BETTER GROWTH   Report Status PENDING     Intake/Output from previous day: 12/09 0701 - 12/10 0700 In: 3200 [I.V.:2700; IV Piggyback:500] Out: 1110 [Urine:1110] Intake/Output this shift: No intake/output data recorded.  General appearance: alert, cooperative and no distress Skin: Open woud is purulent, with some superficial skin slough adjacent to the wound.    Lab Results:   Recent Labs  02/25/16 0640 02/26/16 0420  WBC 21.8* 20.2*  HGB 10.3* 9.7*  HCT 30.2* 28.5*  PLT 260 244    BMET  Recent Labs  02/25/16 0640 02/26/16 0420  NA 132* 131*  K 3.5 3.9  CL 96* 94*  CO2 31 29  GLUCOSE 311* 335*  BUN 9 9  CREATININE 0.74 0.88  CALCIUM 8.0* 8.0*   PT/INR No results for input(s): LABPROT, INR in the last 72 hours.  No results for input(s): AST, ALT, ALKPHOS, BILITOT, PROT, ALBUMIN in the last 168 hours.   Lipase     Component Value Date/Time   LIPASE 16 02/16/2016 2000     Studies/Results: No results found. Prior to Admission medications   Medication Sig Start Date End Date Taking? Authorizing Provider  acetaminophen (TYLENOL) 500 MG tablet Take 1,000 mg by mouth every 6 (six) hours as  needed (for pain).   Yes Historical Provider, MD  metFORMIN (GLUCOPHAGE) 500 MG tablet Take 1 tablet (500 mg total) by mouth 2 (two) times daily with a meal. Patient taking differently: Take 1,000 mg by mouth at bedtime.  02/16/16 03/17/16 Yes Shawn C Joy, PA-C  naproxen (NAPROSYN) 500 MG tablet Take 1 tablet (500 mg total) by mouth 2 (two) times daily. 02/16/16  Yes Shawn C Joy, PA-C  neomycin-bacitracin-polymyxin (NEOSPORIN) [phone] ointment Apply 1 application topically at bedtime. TO AFFECTED SITE   Yes Historical Provider, MD  ondansetron (ZOFRAN ODT) 4 MG disintegrating tablet Take 1 tablet (4 mg total) by mouth every 8 (eight) hours as needed for nausea or vomiting. 02/16/16  Yes Shawn C Joy, PA-C  acetaminophen (TYLENOL) 325 MG tablet Take 2 tablets (650 mg total) by mouth every 6 (six) hours as needed for mild pain (or temp > 100). Patient not taking: Reported on 02/22/2016 09/11/15   Belkys A Regalado, MD  amLODipine (NORVASC) 5 MG tablet Take 1 tablet (5 mg total) by mouth daily. Patient not taking: Reported on 02/22/2016 09/11/15   Belkys A Regalado, MD  doxycycline (VIBRA-TABS) 100 MG tablet Take 1 tablet (100 mg total) by mouth every 12 (twelve) hours. Patient not taking: Reported on 02/22/2016 09/11/15   Belkys A Regalado, MD  HYDROcodone-acetaminophen (NORCO/VICODIN) 5-325 MG tablet Take 1 tablet by mouth every 6 (six) hours as needed. Patient not  taking: Reported on 02/22/2016 02/16/16   Shawn C Joy, PA-C  insulin glargine (LANTUS) 100 UNIT/ML injection Inject 0.35 mLs (35 Units total) into the skin 2 (two) times daily. Patient not taking: Reported on 02/22/2016 09/11/15   Belkys A Regalado, MD  Insulin Syringe 27G X 1/2" 1 ML MISC 1 application by Does not apply route 2 (two) times daily. Patient not taking: Reported on 02/22/2016 09/11/15   Belkys A Regalado, MD  nutrition supplement, JUVEN, (JUVEN) PACK Take 1 packet by mouth 2 (two) times daily between meals. Patient not taking:  Reported on 02/22/2016 09/11/15   Belkys A Regalado, MD    Medications: . enoxaparin (LOVENOX) injection  40 mg Subcutaneous Q24H  . insulin aspart  0-20 Units Subcutaneous Q4H  . insulin aspart  10 Units Subcutaneous TID WC  . insulin glargine  25 Units Subcutaneous BID  . multivitamin with minerals  1 tablet Oral Daily  . pantoprazole  40 mg Oral Daily  . piperacillin-tazobactam (ZOSYN)  IV  3.375 g Intravenous Q8H  . vancomycin  1,000 mg Intravenous Q12H   . sodium chloride 75 mL/hr at 02/26/16 1638   Assessment/Plan  SACRAL WOUND S/p SACRAL WOUND DEBRIDEMENT, 02/24/16, Dr. Georganna Skeans AODM - poor control - DKA Acute kidney injury FEN: IV fluids/Carb Mod diet ID: day 5 Vancomycin and Zosyn DVT:  Lovenox   Plan:  Increased dressing changes and hydrotherapy.  He will need home health and his wife will need to learn to do dressing changes.  I think we can d/c vancomycin although final culture is still pending.   TID and PRN dressing changes, hydrotherapy, Rx.        LOS: 4 days    [name] [name] 02/26/2016 [phone]
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
## 2016-02-26 NOTE — Progress Notes (Signed)
PROGRESS NOTE    [name] [name]  YCX:[id number] DOB: [birth year] DOA: 02/22/2016 PCP: No PCP Per Patient   Brief Narrative:  30 yo male with T2DM, presents with the chief complain of fever, malaise and low back pain. Worsening pain at the site of gluteal wound, which has been draining bloody fluid. On the initial examination, he was afebrile, dehydrated, gluteal wound with erythema and edema. Noted leukocytosis and DKA. Admitted with working diagnosis of sepsis. Started on broad spectrum antibiotics, insulin infusion and surgery consulted.SP debridement (12/08). Glucose still uncontrolled, adjusting insulin regimen. Patient not compliant with diabetic diet.    Assessment & Plan:   Principal Problem:   Sepsis, unspecified organism [facility]) Active Problems:   Uncontrolled diabetes mellitus ([place])   Normocytic anemia   Hypertension   Cellulitis   AKI (acute kidney injury) ([place])   DKA (diabetic ketoacidoses) ([place])     1. Sepsis due to cellulitis, present on admission. Patient tolerating well po, will hold on IV fluids. Continue antibiotic therapy with zosyn and vancomycin IV. WBC down to 20 from 21. Cultures continue no growth. Nasal MRSA screen negative. Will plan to transition to clindamycin or bactrim po at discharge.   2. T2dm with resolved DKA. Uncontrolled hyperglycemia, serum glucose 350 to 400,  will continue increase basal insulin to 25units bid, start pre-meal insulin 10 units aspart and continue glucose monitoring q 4 hours plus short acting insilun sliding scale. Calculate requirements in the next 24 hours.  No nausea or vomiting, patient tolerating po well. Anion gap remains below 12.   3. Ulcerated wound with cellulitis. Continue IV antibiotics, sp debridement today.  Pain control and postsurgical care. Vancomycin level 15 random (12/9), follow on pharmacy protocol. Increased dressing changes and started on hydrotherapy.   4. AKI with hypokalemia. Renal function with cr at 0.88  with k at 3,9 and Na at 131 with serum bicarb at 29 and cl at 94, will hold on saline for now and will continue to follow renal panel in am. Patient tolerating po well.    DVT prophylaxis:lovenox  Code Status:full  Family Communication:no family at the bedside  Disposition Plan:home   Consultants:  Surgery    Antimicrobials:   Vancomycin                            Zosyn    Subjective: Patient is awake and alert, no nausea or vomiting. No chest pain or dyspnea. Surgical wound pain has improved, now is moderate in intensity. Out of bed. Glucose still high, patient has been not compliant with diabetic diet.   Objective: Vitals:   02/25/16 1241 02/25/16 1814 02/25/16 2201 02/26/16 0700  BP: 112/75 121/68 100/70 99/67  Pulse: 93 (!) 110 (!) 101 (!) 112  Resp: 17 17 19 19   Temp: 98.7 F (37.1 C) 99.2 F (37.3 C) 99 F (37.2 C) 98.1 F (36.7 C)  TempSrc: Oral Oral Oral Oral  SpO2: 96% 99% 99% 99%  Weight:      Height:        Intake/Output Summary (Last 24 hours) at 02/26/16 0846 Last data filed at 02/26/16 0700  Gross per 24 hour  Intake             3200 ml  Output             1110 ml  Net             2090 ml   Filed  Weights   02/23/16 0520 02/25/16 0402  Weight: 98.7 kg (217 lb 9.5 oz) 97.9 kg (215 lb 13.3 oz)    Examination:  General exam: not in pain or dyspnea E ENT: no pallor or icterus. Oral mucosa moist. Respiratory system: Clear to auscultation. Respiratory effort normal. No wheezing, rales or rhonchi.  Cardiovascular system: S1 & S2 heard, RRR. No JVD, murmurs, rubs, gallops or clicks. No pedal edema. Gastrointestinal system: Abdomen is nondistended, soft and nontender. No organomegaly or masses felt. Normal bowel sounds heard. Central nervous system: Alert and oriented. No focal neurological deficits. Extremities: Symmetric 5 x 5 power. Skin: sacral wound with dressing in place.      Data Reviewed: I have personally reviewed following labs  and imaging studies  CBC:  Recent Labs Lab 02/22/16 1301 02/24/16 0244 02/25/16 0640 02/26/16 0420  WBC 36.1* 32.1* 21.8* 20.2*  NEUTROABS  --  27.7* 17.5* 15.6*  HGB 12.7* 10.9* 10.3* 9.7*  HCT 35.6* 30.5* 30.2* 28.5*  MCV 83.6 84.0 86.8 86.9  PLT 394 310 260 962   Basic Metabolic Panel:  Recent Labs Lab 02/23/16 0557 02/23/16 1601 02/24/16 0244 02/25/16 0640 02/26/16 0420  NA 129* 128* 129* 132* 131*  K 3.3* 4.0 3.7 3.5 3.9  CL 98* 99* 96* 96* 94*  CO2 22 22 23 31 29   GLUCOSE 145* 319* 374* 311* 335*  BUN 13 9 9 9 9   CREATININE 0.90 0.89 0.89 0.74 0.88  CALCIUM 8.1* 8.1* 8.3* 8.0* 8.0*   GFR: Estimated Creatinine Clearance: 138 mL/min (by C-G formula based on SCr of 0.88 mg/dL). Liver Function Tests: No results for input(s): AST, ALT, ALKPHOS, BILITOT, PROT, ALBUMIN in the last 168 hours. No results for input(s): LIPASE, AMYLASE in the last 168 hours. No results for input(s): AMMONIA in the last 168 hours. Coagulation Profile:  Recent Labs Lab 02/23/16 0033  INR 1.12   Cardiac Enzymes:  Recent Labs Lab 02/23/16 0215  TROPONINI 0.17*   BNP (last 3 results) No results for input(s): PROBNP in the last 8760 hours. HbA1C: No results for input(s): HGBA1C in the last 72 hours. CBG:  Recent Labs Lab 02/25/16 1607 02/25/16 1636 02/25/16 1751 02/25/16 2114 02/26/16 0747  GLUCAP 446* 401* 353* 375* 290*   Lipid Profile: No results for input(s): CHOL, HDL, LDLCALC, TRIG, CHOLHDL, LDLDIRECT in the last 72 hours. Thyroid Function Tests: No results for input(s): TSH, T4TOTAL, FREET4, T3FREE, THYROIDAB in the last 72 hours. Anemia Panel: No results for input(s): VITAMINB12, FOLATE, FERRITIN, TIBC, IRON, RETICCTPCT in the last 72 hours. Sepsis Labs:  Recent Labs Lab 02/22/16 1606 02/22/16 2054 02/23/16 0033  PROCALCITON  --   --  0.98  LATICACIDVEN 2.50* 2.27* 1.7    Recent Results (from the past 240 hour(s))  Culture, blood (routine x 2)      Status: None   Collection Time: 02/16/16  8:00 PM  Result Value Ref Range Status   Specimen Description BLOOD RIGHT ARM  Final   Special Requests AEROBIC BOTTLE ONLY 5CC  Final   Culture   Final    NO GROWTH 5 DAYS Performed at [facility]    Report Status 02/21/2016 FINAL  Final  Culture, blood (routine x 2)     Status: None   Collection Time: 02/16/16  9:44 PM  Result Value Ref Range Status   Specimen Description BLOOD LEFT HAND  Final   Special Requests BOTTLES DRAWN AEROBIC AND ANAEROBIC 5ML  Final   Culture  Final    NO GROWTH 5 DAYS Performed at [facility]    Report Status 02/21/2016 FINAL  Final  Blood Culture (routine x 2)     Status: None (Preliminary result)   Collection Time: 02/22/16  3:19 PM  Result Value Ref Range Status   Specimen Description BLOOD LEFT HAND  Final   Special Requests BOTTLES DRAWN AEROBIC AND ANAEROBIC 5CC  Final   Culture NO GROWTH 3 DAYS  Final   Report Status PENDING  Incomplete  Blood Culture (routine x 2)     Status: None (Preliminary result)   Collection Time: 02/22/16  3:24 PM  Result Value Ref Range Status   Specimen Description BLOOD RIGHT HAND  Final   Special Requests BOTTLES DRAWN AEROBIC AND ANAEROBIC 5CC  Final   Culture NO GROWTH 3 DAYS  Final   Report Status PENDING  Incomplete  Aerobic Culture (superficial specimen)     Status: None   Collection Time: 02/22/16  6:36 PM  Result Value Ref Range Status   Specimen Description WOUND SACRAL  Final   Special Requests NONE  Final   Gram Stain NO WBC SEEN MODERATE GRAM POSITIVE COCCI   Final   Culture   Final    MODERATE STREPTOCOCCUS AGALACTIAE TESTING AGAINST S. AGALACTIAE NOT ROUTINELY PERFORMED DUE TO PREDICTABILITY OF AMP/PEN/VAN SUSCEPTIBILITY.    Report Status 02/24/2016 FINAL  Final  MRSA PCR Screening     Status: None   Collection Time: 02/23/16  5:26 AM  Result Value Ref Range Status   MRSA by PCR NEGATIVE NEGATIVE Final    Comment:        The  GeneXpert MRSA Assay (FDA approved for NASAL specimens only), is one component of a comprehensive MRSA colonization surveillance program. It is not intended to diagnose MRSA infection nor to guide or monitor treatment for MRSA infections.   Anaerobic culture     Status: None (Preliminary result)   Collection Time: 02/24/16 10:26 AM  Result Value Ref Range Status   Specimen Description WOUND SACRAL  Final   Special Requests SPEC A  Final   Culture   Final    NO ANAEROBES ISOLATED; CULTURE IN PROGRESS FOR 5 DAYS   Report Status PENDING  Incomplete  Aerobic Culture (superficial specimen)     Status: None (Preliminary result)   Collection Time: 02/24/16 10:26 AM  Result Value Ref Range Status   Specimen Description WOUND SACRAL  Final   Special Requests SPEC A  Final   Gram Stain   Final    ABUNDANT WBC PRESENT, PREDOMINANTLY PMN ABUNDANT GRAM POSITIVE COCCOBACILLUS    Culture CULTURE REINCUBATED FOR BETTER GROWTH  Final   Report Status PENDING  Incomplete         Radiology Studies: No results found.      Scheduled Meds: . enoxaparin (LOVENOX) injection  40 mg Subcutaneous Q24H  . insulin aspart  0-20 Units Subcutaneous Q4H  . insulin aspart  10 Units Subcutaneous TID WC  . insulin glargine  25 Units Subcutaneous BID  . multivitamin with minerals  1 tablet Oral Daily  . pantoprazole  40 mg Oral Daily  . piperacillin-tazobactam (ZOSYN)  IV  3.375 g Intravenous Q8H  . vancomycin  1,000 mg Intravenous Q12H   Continuous Infusions: . sodium chloride 75 mL/hr at 02/26/16 4174     LOS: 4 days       Mirtha Jain Gerome Apley, MD [facility] Pager [phone]  If 7PM-7AM, please contact night-coverage [email or web address]  Password [email or web address] 02/26/2016, 8:46 AM

## 2016-02-27 DIAGNOSIS — IMO0002 Reserved for concepts with insufficient information to code with codable children: Secondary | ICD-10-CM

## 2016-02-27 DIAGNOSIS — A401 Sepsis due to streptococcus, group B: Secondary | ICD-10-CM

## 2016-02-27 DIAGNOSIS — E118 Type 2 diabetes mellitus with unspecified complications: Secondary | ICD-10-CM

## 2016-02-27 DIAGNOSIS — N179 Acute kidney failure, unspecified: Secondary | ICD-10-CM

## 2016-02-27 DIAGNOSIS — E1165 Type 2 diabetes mellitus with hyperglycemia: Secondary | ICD-10-CM

## 2016-02-27 DIAGNOSIS — B952 Enterococcus as the cause of diseases classified elsewhere: Secondary | ICD-10-CM

## 2016-02-27 DIAGNOSIS — A498 Other bacterial infections of unspecified site: Secondary | ICD-10-CM

## 2016-02-27 LAB — CBC WITH DIFFERENTIAL/PLATELET
BASOS ABS: 0 10*3/uL (ref 0.0–0.1)
BASOS PCT: 0 %
EOS PCT: 1 %
Eosinophils Absolute: 0.2 10*3/uL (ref 0.0–0.7)
HEMATOCRIT: 28.7 % — AB (ref 39.0–52.0)
HEMOGLOBIN: 9.9 g/dL — AB (ref 13.0–17.0)
LYMPHS PCT: 14 %
Lymphs Abs: 2.2 10*3/uL (ref 0.7–4.0)
MCH: 29.9 pg (ref 26.0–34.0)
MCHC: 34.5 g/dL (ref 30.0–36.0)
MCV: 86.7 fL (ref 78.0–100.0)
MONOS PCT: 6 %
Monocytes Absolute: 0.9 10*3/uL (ref 0.1–1.0)
NEUTROS ABS: 12.1 10*3/uL — AB (ref 1.7–7.7)
Neutrophils Relative %: 79 %
Platelets: 273 10*3/uL (ref 150–400)
RBC: 3.31 MIL/uL — ABNORMAL LOW (ref 4.22–5.81)
RDW: 12.4 % (ref 11.5–15.5)
WBC: 15.4 10*3/uL — ABNORMAL HIGH (ref 4.0–10.5)

## 2016-02-27 LAB — AEROBIC CULTURE W GRAM STAIN (SUPERFICIAL SPECIMEN)

## 2016-02-27 LAB — CULTURE, BLOOD (ROUTINE X 2)
Culture: NO GROWTH
Culture: NO GROWTH

## 2016-02-27 LAB — AEROBIC CULTURE  (SUPERFICIAL SPECIMEN)

## 2016-02-27 LAB — BASIC METABOLIC PANEL
Anion gap: 7 (ref 5–15)
BUN: 8 mg/dL (ref 6–20)
CHLORIDE: 96 mmol/L — AB (ref 101–111)
CO2: 31 mmol/L (ref 22–32)
Calcium: 8.2 mg/dL — ABNORMAL LOW (ref 8.9–10.3)
Creatinine, Ser: 0.82 mg/dL (ref 0.61–1.24)
GFR calc Af Amer: >60 mL/min (ref >60)
GFR calc non Af Amer: >60 mL/min (ref >60)
Glucose, Bld: 206 mg/dL — ABNORMAL HIGH (ref 65–99)
POTASSIUM: 3.7 mmol/L (ref 3.5–5.1)
SODIUM: 134 mmol/L — AB (ref 135–145)

## 2016-02-27 LAB — GLUCOSE, CAPILLARY
GLUCOSE-CAPILLARY: 238 mg/dL — AB (ref 65–99)
Glucose-Capillary: 282 mg/dL — ABNORMAL HIGH (ref 65–99)
Glucose-Capillary: 191 mg/dL — ABNORMAL HIGH (ref 65–99)
Glucose-Capillary: 295 mg/dL — ABNORMAL HIGH (ref 65–99)
Glucose-Capillary: 246 mg/dL — ABNORMAL HIGH (ref 65–99)
Glucose-Capillary: 200 mg/dL — ABNORMAL HIGH (ref 65–99)

## 2016-02-27 MED ORDER — INSULIN GLARGINE 100 UNIT/ML ~~LOC~~ SOLN
30.0000 [IU] | Freq: Two times a day (BID) | SUBCUTANEOUS | Status: DC
  Administered 2016-02-27 – 2016-02-28 (×2): 30 [IU] via SUBCUTANEOUS
  Filled 2016-02-27 (×3): qty 0.3

## 2016-02-27 MED ORDER — INSULIN ASPART 100 UNIT/ML ~~LOC~~ SOLN
15.0000 [IU] | Freq: Three times a day (TID) | SUBCUTANEOUS | Status: DC
  Administered 2016-02-28: 15 [IU] via SUBCUTANEOUS

## 2016-02-27 MED ORDER — PRO-STAT SUGAR FREE PO LIQD
30.0000 mL | Freq: Two times a day (BID) | ORAL | Status: DC
  Administered 2016-02-27 – 2016-02-29 (×5): 30 mL via ORAL
  Filled 2016-02-27 (×5): qty 30

## 2016-02-27 NOTE — Progress Notes (Signed)
Physical Therapy Wound Treatment Patient Details  Name: [name] [name] MRN: [id number] Date of Birth: [birth year]  Today's Date: 02/27/2016 Time: 4098-1191 Time Calculation (min): 24 min  Subjective  Subjective: "I'm scared." Patient and Family Stated Goals: Get wound better Date of Onset:  (3 wks ago) Prior Treatments: surgical I&D  Pain Score:  6 - premedicated  Wound Assessment  Wound / Incision (Open or Dehisced) 02/23/16 Non-pressure wound Sacrum Medial s/p I&D (Active)  Dressing Type Gauze (Comment);Barrier Film (skin prep);ABD;Moist to dry 02/27/2016  9:45 AM  Dressing Changed Changed 02/27/2016  9:45 AM  Dressing Status Clean;Dry;Intact 02/27/2016  9:45 AM  Dressing Change Frequency Twice a day 02/27/2016  9:45 AM  Site / Wound Assessment Pink;Yellow;Pale;Black 02/27/2016  9:45 AM  % Wound base Red or Granulating 40% 02/27/2016  9:45 AM  % Wound base Yellow/Fibrinous Exudate 55% 02/27/2016  9:45 AM  % Wound base Black/Eschar 5% 02/27/2016  9:45 AM  % Wound base Other/Granulation Tissue (Comment) 0% 02/27/2016  9:45 AM  Peri-wound Assessment Induration 02/27/2016  9:45 AM  Wound Length (cm) 9.5 cm 02/27/2016  9:45 AM  Wound Width (cm) 5 cm 02/27/2016  9:45 AM  Wound Depth (cm) 5.5 cm 02/27/2016  9:45 AM  Margins Unattached edges (unapproximated) 02/27/2016  9:45 AM  Closure None 02/27/2016  9:45 AM  Drainage Amount Moderate 02/27/2016  9:45 AM  Drainage Description Purulent;No odor 02/27/2016  9:45 AM  Treatment Debridement (Selective);Hydrotherapy (Pulse lavage);Packing (Saline gauze) 02/27/2016  9:45 AM   Hydrotherapy Pulsed lavage therapy - wound location: sacrum Pulsed Lavage with Suction (psi): 8 psi Pulsed Lavage with Suction - Normal Saline Used: 1000 mL Pulsed Lavage Tip: Tip with splash shield Selective Debridement Selective Debridement - Location: sacrum Selective Debridement - Tools Used: Forceps;Scissors Selective Debridement - Tissue Removed: yellow slough    Wound Assessment and Plan  Wound Therapy - Assess/Plan/Recommendations Wound Therapy - Clinical Statement: Pt presents to hydrotherapy with open wound s/p I&D of pyelonodial cyst. Can benefit from hydrotherapy for removal of necrotic tissue and to decrease bioburden.  Wound Therapy - Functional Problem List: Decr sitting tolerance Factors Delaying/Impairing Wound Healing: Diabetes Mellitus;Infection - systemic/local Hydrotherapy Plan: Debridement;Dressing change;Patient/family education;Pulsatile lavage with suction Wound Therapy - Frequency: 6X / week Wound Therapy - Follow Up Recommendations: Home health RN Wound Plan: See above  Wound Therapy Goals- Improve the function of patient's integumentary system by progressing the wound(s) through the phases of wound healing (inflammation - proliferation - remodeling) by: Decrease Necrotic Tissue to: 20 Decrease Necrotic Tissue - Progress: Goal set today Increase Granulation Tissue to: 80 Increase Granulation Tissue - Progress: Goal set today  Goals will be updated until maximal potential achieved or discharge criteria met.  Discharge criteria: when goals achieved, discharge from hospital, MD decision/surgical intervention, no progress towards goals, refusal/missing three consecutive treatments without notification or medical reason.  GP     Shary Decamp [place] 02/27/2016, 10:24 AM Suanne Marker PT [phone]

## 2016-02-27 NOTE — Progress Notes (Signed)
Nutrition Follow-up  DOCUMENTATION CODES:   Obesity unspecified  INTERVENTION:   -Continue MVI -30 ml Prostat BID, each supplement provides 100 kcals and 15 grams protein -Reinforced DM self-management education  NUTRITION DIAGNOSIS:   Increased nutrient needs related to wound healing as evidenced by estimated needs.  Ongoing  GOAL:   Patient will meet greater than or equal to 90% of their needs  Progressing  MONITOR:   PO intake, Supplement acceptance, I & O's, Skin  REASON FOR ASSESSMENT:   Consult Wound healing  ASSESSMENT:   Pt with PMH significant for type 2 DM, HTN, history of MRSA skin infection, and nonadherence to his medications, now presenting to the emergency department with several days of progressive fatigue, weakness, malaise, and pain at the site of a boil overlying the sacrum.  S/p I&D of sacral wound on 02/24/16. Hydrotherapy initiated on 02/27/16.   Spoke with pt at bedside, who reports good appetite currently. He shares that his appetite was poor 1 week PTA related to pain and "my mood". Pt shares that he and his wife struggle financially and sometimes go days without eating. He is noncompliant with DM self-management due to inability to afford medications and testing supplies.   Pt very honest about short-comings and states that he "loves sweets and fruit". He has been thinking about ways to improve his diet on his limited budget, including increasing vegetables, replacing sweet with fruits, increasing whole grains, and eliminating sugary beverages. Discussed ways that pt could improve his diet by reducing simple carbohydrates and increase protein intake to support wound healing. Pt shares that he intends to continue MVI at home. Reviewed DM coordinator note; working on ways for pt to procure more affordable medications and meter/testing strips. Pt is knowledgeable about potential plan to discharge on 70/30 insulin and using Reli-On meter and strips at  [facility].   Discussed importance of continued good DM self-management to assist with wound healing and how continued poor control will impact wound healing.   Labs reviewed: Na: 134, CBGS: 191-282. Hgb A1c: 13.7.   Diet Order:  Diet Carb Modified Fluid consistency: Thin; Room service appropriate? Yes  Skin:  Wound (see comment) (non-pressure wound to sacrum)  Last BM:  02/27/16  Height:   Ht Readings from Last 1 Encounters:  02/23/16 5\' 7"  (1.702 m)    Weight:   Wt Readings from Last 1 Encounters:  02/25/16 215 lb 13.3 oz (97.9 kg)    Ideal Body Weight:  67.2 kg  BMI:  Body mass index is 33.8 kg/m.  Estimated Nutritional Needs:   Kcal:  2000-2200  Protein:  115-130 grams  Fluid:  > 2.0 L/day  EDUCATION NEEDS:   Education needs addressed  Amandalee Lacap A. Jimmye Norman, RD, LDN, CDE Pager: [phone] After hours Pager: [phone]

## 2016-02-27 NOTE — Progress Notes (Signed)
3 Days Post-Op  Subjective: Seems to be doing well.  I looked at the wound with the Hydrotherapy team.  He has a lot of white sluff along the sides of the wound.  Hydrotherapy working on this now.    Objective: Vital signs in last 24 hours: Temp:  [97.7 F (36.5 C)-98.6 F (37 C)] 97.7 F (36.5 C) (12/11 0447) Pulse Rate:  [82-90] 88 (12/11 0447) Resp:  [18-19] 18 (12/11 0447) BP: (108-132)/(68-80) 132/80 (12/11 0447) SpO2:  [99 %-100 %] 100 % (12/11 0447) Last BM Date: 02/22/16  Intake/Output from previous day: 12/10 0701 - 12/11 0700 In: 2538.8 [P.O.:300; I.V.:1738.8; IV Piggyback:500] Out: -  Intake/Output this shift: No intake/output data recorded.  General appearance: alert, cooperative and no distress Skin: he has allot of white colored slough along the wound walls.  He seems to be tolerating everything well.  Lab Results:   Recent Labs  02/26/16 0420 02/27/16 0613  WBC 20.2* 15.4*  HGB 9.7* 9.9*  HCT 28.5* 28.7*  PLT 244 273    BMET  Recent Labs  02/26/16 0420 02/27/16 0613  NA 131* 134*  K 3.9 3.7  CL 94* 96*  CO2 29 31  GLUCOSE 335* 206*  BUN 9 8  CREATININE 0.88 0.82  CALCIUM 8.0* 8.2*   PT/INR No results for input(s): LABPROT, INR in the last 72 hours.  No results for input(s): AST, ALT, ALKPHOS, BILITOT, PROT, ALBUMIN in the last 168 hours.   Lipase     Component Value Date/Time   LIPASE 16 02/16/2016 2000     Studies/Results: No results found.  Medications: . enoxaparin (LOVENOX) injection  40 mg Subcutaneous Q24H  . insulin aspart  0-20 Units Subcutaneous Q4H  . insulin aspart  10 Units Subcutaneous TID WC  . insulin glargine  25 Units Subcutaneous BID  . multivitamin with minerals  1 tablet Oral Daily  . pantoprazole  40 mg Oral Daily  . piperacillin-tazobactam (ZOSYN)  IV  3.375 g Intravenous Q8H  . vancomycin  1,000 mg Intravenous Q12H   . sodium chloride 75 mL/hr at 02/26/16 2113   Gram Stain ABUNDANT WBC PRESENT,  PREDOMINANTLY PMN ABUNDANT GRAM POSITIVE COCCOBACILLUS   Culture MODERATE ENTEROCOCCUS FAECALIS MODERATE STREPTOCOCCUS AGALACTIAE      Assessment/Plan SACRAL WOUND S/p SACRAL WOUND DEBRIDEMENT, 02/24/16, Dr. Georganna Skeans AODM - poor control - DKA Acute kidney injury FEN: IV fluids/Carb Mod diet ID: day 6 Vancomycin and Zosyn DVT:  Lovenox   Plan:  Antibiotics per medicine, Hydrotherapy, frequent dressing changes until he cleans up.  At home he will need BID dressing changes and he will be able to use shower at home to clean wound.      LOS: 5 days    Keirra Zeimet 02/27/2016 [phone]

## 2016-02-27 NOTE — Progress Notes (Signed)
PROGRESS NOTE    [name] [name]  BWG:[id number] DOB: [birth year] DOA: 02/22/2016 PCP: No PCP Per Patient     Brief Narrative:  30 y.o.WM PMHx DM type 2 uncontrolled with complications, HTN, MRSA skin infection, Nonadherence to his medications,   Now presenting to the emergency department with several days of progressive fatigue, weakness, malaise, and pain at the site of a boil overlying the sacrum. Patient reports noting a wound at the top of the gluteal cleft several weeks ago and has been treated as an outpatient with Bactrim and doxycycline and had thought it was improving. Pain at the site has worsened significantly in recent days and this has been accompanied by subjective fevers and chills, with fatigue and malaise. Patient reports that the wound has been draining, mostly thin bloody liquid. He denies any chest pain or palpitations and denies any significant dyspnea or cough. He reports that he does not take his insulin due to financial constraints. He is prescribed Norvasc for hypertension, but has never taken it.  ED Course: Upon arrival to the ED, patient is found to be afebrile, saturating adequately on room air, hypotensive to the 80s/50s, mildly tachycardic, and mildly tachypneic. EKG features a sinus rhythm with ST abnormalities in the inferolateral leads with no prior for comparison. Chemistry panel is notable for a serum glucose of 504 with sodium of 125, bicarbonate of 19, anion gap of 18, and serum creatinine of 1.29, up from 0.86 one week ago. CBC features a leukocytosis to 36,100 with a stable normocytic anemia. Lactic acid is elevated to a value of 2.50 and a urinalysis is notable for glucosuria and ketonuria. CT of the pelvis revealed subcutaneous gas overlying the coccyx and right gluteal region with no discrete abscess or drainable collection. Patient was given 3 L of normal saline in the emergency department with improvement in his blood pressure and tachycardia. He was started on  an insulin infusion and treated with Ancef and clindamycin. Tdap was updated and Gen. surgery was consulted by the ED physician. Surgeon evaluated the patient in the ED and advises a medical admission. Patient will be admitted to the stepdown unit for ongoing evaluation and management of sepsis suspected secondary to skin/soft tissue infection, as well as DKA.    Subjective: 12/11  A/O 4, pain in his buttocks area consistent with I&D.   Assessment & Plan:   Principal Problem:   Sepsis, unspecified organism [facility]) Active Problems:   Uncontrolled diabetes mellitus ([place])   Normocytic anemia   Hypertension   Cellulitis   AKI (acute kidney injury) ([place])   DKA (diabetic ketoacidoses) ([place])   Sepsis due to cellulitis/Ulcerated Sacral wound,  -02/24/16 S/p SACRAL WOUND DEBRIDEMENT, dressing change in hydrotherapy per surgery -Continue antibiotics per surgery   DM  Type 2 uncontrolled with complication/DKA.  -17/7 Hemoglobin A1c = 13.7  -DKA resolved -12/11 increase Lantus 30 units BID -12/11 Increase NovoLog 15 units QAC -Resistant SSI -Counseled to social work for help obtaining medication and insurance.   Acute renal failure  Lab Results  Component Value Date   CREATININE 0.82 02/27/2016   CREATININE 0.88 02/26/2016   CREATININE 0.74 02/25/2016  -Resolved     DVT prophylaxis: Subcutaneous heparin Code Status: Full Family Communication: None Disposition Plan: Per surgery   Consultants:  Dr. Georganna Skeans Surgery    Procedures/Significant Events:  02/24/16 S/p SACRAL WOUND DEBRIDEMENT,    VENTILATOR SETTINGS: NA    Cultures 12/6 blood 2 negative 12/6 sacral wound positive STREPTOCOCCUS AGALACTIAE 12/7  MRSA by PCR negative 12/8 sacral wound positive ENTEROCOCCUS FAECALIS    Antimicrobials: Clindamycin 12/6 1 dose Zosyn 12/6>> Vancomycin 12/6>>   Devices    LINES / TUBES:  NA    Continuous Infusions: . sodium chloride 75 mL/hr at  02/26/16 2113     Objective: Vitals:   02/26/16 1505 02/27/16 0211 02/27/16 0447 02/27/16 1315  BP: 108/68 108/78 132/80 129/77  Pulse: 90 82 88 91  Resp:  19 18   Temp: 98.6 F (37 C) 98.5 F (36.9 C) 97.7 F (36.5 C) 98 F (36.7 C)  TempSrc: Oral Oral Oral Oral  SpO2: 100% 99% 100% 100%  Weight:      Height:        Intake/Output Summary (Last 24 hours) at 02/27/16 1751 Last data filed at 02/27/16 4431  Gross per 24 hour  Intake          2538.75 ml  Output                0 ml  Net          2538.75 ml   Filed Weights   02/23/16 0520 02/25/16 0402  Weight: 98.7 kg (217 lb 9.5 oz) 97.9 kg (215 lb 13.3 oz)    Examination:  General: A/O 4, positive buttocks pain appropriate for I&D/hydrotherapy, No acute respiratory distress Eyes: negative scleral hemorrhage, negative anisocoria, negative icterus ENT: Negative Runny nose, negative gingival bleeding, Neck:  Negative scars, masses, torticollis, lymphadenopathy, JVD Lungs: Clear to auscultation bilaterally without wheezes or crackles Cardiovascular: Regular rate and rhythm without murmur gallop or rub normal S1 and S2 Abdomen: negative abdominal pain, nondistended, positive soft, bowel sounds, no rebound, no ascites, no appreciable mass Extremities: No significant cyanosis, clubbing, or edema bilateral lower extremities Skin: Negative rashes, lesions, ulcers Psychiatric:  Negative depression, negative anxiety, negative fatigue, negative mania  Central nervous system:  Cranial nerves II through XII intact, tongue/uvula midline, all extremities muscle strength 5/5, sensation intact throughout, negative dysarthria, negative expressive aphasia, negative receptive aphasia.  .     Data Reviewed: Care during the described time interval was provided by me .  I have reviewed this patient's available data, including medical history, events of note, physical examination, and all test results as part of my evaluation. I have  personally reviewed and interpreted all radiology studies.  CBC:  Recent Labs Lab 02/22/16 1301 02/24/16 0244 02/25/16 0640 02/26/16 0420 02/27/16 0613  WBC 36.1* 32.1* 21.8* 20.2* 15.4*  NEUTROABS  --  27.7* 17.5* 15.6* 12.1*  HGB 12.7* 10.9* 10.3* 9.7* 9.9*  HCT 35.6* 30.5* 30.2* 28.5* 28.7*  MCV 83.6 84.0 86.8 86.9 86.7  PLT 394 310 260 244 540   Basic Metabolic Panel:  Recent Labs Lab 02/23/16 1601 02/24/16 0244 02/25/16 0640 02/26/16 0420 02/27/16 0613  NA 128* 129* 132* 131* 134*  K 4.0 3.7 3.5 3.9 3.7  CL 99* 96* 96* 94* 96*  CO2 22 23 31 29 31   GLUCOSE 319* 374* 311* 335* 206*  BUN 9 9 9 9 8   CREATININE 0.89 0.89 0.74 0.88 0.82  CALCIUM 8.1* 8.3* 8.0* 8.0* 8.2*   GFR: Estimated Creatinine Clearance: 148.2 mL/min (by C-G formula based on SCr of 0.82 mg/dL). Liver Function Tests: No results for input(s): AST, ALT, ALKPHOS, BILITOT, PROT, ALBUMIN in the last 168 hours. No results for input(s): LIPASE, AMYLASE in the last 168 hours. No results for input(s): AMMONIA in the last 168 hours. Coagulation Profile:  Recent Labs Lab 02/23/16  0033  INR 1.12   Cardiac Enzymes:  Recent Labs Lab 02/23/16 0215  TROPONINI 0.17*   BNP (last 3 results) No results for input(s): PROBNP in the last 8760 hours. HbA1C: No results for input(s): HGBA1C in the last 72 hours. CBG:  Recent Labs Lab 02/27/16 0150 02/27/16 0555 02/27/16 0940 02/27/16 1313 02/27/16 1714  GLUCAP 282* 191* 238* 295* 246*   Lipid Profile: No results for input(s): CHOL, HDL, LDLCALC, TRIG, CHOLHDL, LDLDIRECT in the last 72 hours. Thyroid Function Tests: No results for input(s): TSH, T4TOTAL, FREET4, T3FREE, THYROIDAB in the last 72 hours. Anemia Panel: No results for input(s): VITAMINB12, FOLATE, FERRITIN, TIBC, IRON, RETICCTPCT in the last 72 hours. Sepsis Labs:  Recent Labs Lab 02/22/16 1606 02/22/16 2054 02/23/16 0033  PROCALCITON  --   --  0.98  LATICACIDVEN 2.50* 2.27* 1.7     Recent Results (from the past 240 hour(s))  Blood Culture (routine x 2)     Status: None   Collection Time: 02/22/16  3:19 PM  Result Value Ref Range Status   Specimen Description BLOOD LEFT HAND  Final   Special Requests BOTTLES DRAWN AEROBIC AND ANAEROBIC 5CC  Final   Culture NO GROWTH 5 DAYS  Final   Report Status 02/27/2016 FINAL  Final  Blood Culture (routine x 2)     Status: None   Collection Time: 02/22/16  3:24 PM  Result Value Ref Range Status   Specimen Description BLOOD RIGHT HAND  Final   Special Requests BOTTLES DRAWN AEROBIC AND ANAEROBIC 5CC  Final   Culture NO GROWTH 5 DAYS  Final   Report Status 02/27/2016 FINAL  Final  Aerobic Culture (superficial specimen)     Status: None   Collection Time: 02/22/16  6:36 PM  Result Value Ref Range Status   Specimen Description WOUND SACRAL  Final   Special Requests NONE  Final   Gram Stain NO WBC SEEN MODERATE GRAM POSITIVE COCCI   Final   Culture   Final    MODERATE STREPTOCOCCUS AGALACTIAE TESTING AGAINST S. AGALACTIAE NOT ROUTINELY PERFORMED DUE TO PREDICTABILITY OF AMP/PEN/VAN SUSCEPTIBILITY.    Report Status 02/24/2016 FINAL  Final  MRSA PCR Screening     Status: None   Collection Time: 02/23/16  5:26 AM  Result Value Ref Range Status   MRSA by PCR NEGATIVE NEGATIVE Final    Comment:        The GeneXpert MRSA Assay (FDA approved for NASAL specimens only), is one component of a comprehensive MRSA colonization surveillance program. It is not intended to diagnose MRSA infection nor to guide or monitor treatment for MRSA infections.   Anaerobic culture     Status: None (Preliminary result)   Collection Time: 02/24/16 10:26 AM  Result Value Ref Range Status   Specimen Description WOUND SACRAL  Final   Special Requests SPEC A  Final   Culture   Final    NO ANAEROBES ISOLATED; CULTURE IN PROGRESS FOR 5 DAYS   Report Status PENDING  Incomplete  Aerobic Culture (superficial specimen)     Status: None    Collection Time: 02/24/16 10:26 AM  Result Value Ref Range Status   Specimen Description WOUND SACRAL  Final   Special Requests SPEC A  Final   Gram Stain   Final    ABUNDANT WBC PRESENT, PREDOMINANTLY PMN ABUNDANT GRAM POSITIVE COCCOBACILLUS    Culture   Final    MODERATE ENTEROCOCCUS FAECALIS MODERATE GROUP B STREP(S.AGALACTIAE)ISOLATED TESTING AGAINST S. AGALACTIAE NOT  ROUTINELY PERFORMED DUE TO PREDICTABILITY OF AMP/PEN/VAN SUSCEPTIBILITY.    Report Status 02/27/2016 FINAL  Final   Organism ID, Bacteria ENTEROCOCCUS FAECALIS  Final      Susceptibility   Enterococcus faecalis - MIC*    AMPICILLIN <=2 SENSITIVE Sensitive     VANCOMYCIN 2 SENSITIVE Sensitive     GENTAMICIN SYNERGY SENSITIVE Sensitive     * MODERATE ENTEROCOCCUS FAECALIS         Radiology Studies: No results found.      Scheduled Meds: . enoxaparin (LOVENOX) injection  40 mg Subcutaneous Q24H  . feeding supplement (PRO-STAT SUGAR FREE 64)  30 mL Oral BID  . insulin aspart  0-20 Units Subcutaneous Q4H  . insulin aspart  10 Units Subcutaneous TID WC  . insulin glargine  25 Units Subcutaneous BID  . multivitamin with minerals  1 tablet Oral Daily  . pantoprazole  40 mg Oral Daily  . piperacillin-tazobactam (ZOSYN)  IV  3.375 g Intravenous Q8H  . vancomycin  1,000 mg Intravenous Q12H   Continuous Infusions: . sodium chloride 75 mL/hr at 02/26/16 2113     LOS: 5 days    Time spent:40 min    WOODS, Geraldo Docker, MD [facility] Pager [phone]  If 7PM-7AM, please contact night-coverage [email or web address] Password [email or web address] 02/27/2016, 5:51 PM

## 2016-02-27 NOTE — Care Management Note (Signed)
Case Management Note  Patient Details  Name: Aleksa Catterton MRN: [id number] Date of Birth: June 12, 1985  Subjective/Objective:                    Action/Plan:  Discussed home health with patient . Voiced understanding that home health will not be there everytime dressing needs to be changed . Patient states him and his wife are both able to be shown how to change dressing.  Provided [employer] and [employer] information and explained.    Explained [place] letter ( medication assistance) , can provide on day of discharge.  Patient voiced understanding to all of above. Expected Discharge Date:                  Expected Discharge Plan:  [place]  In-House Referral:  Financial Counselor  Discharge planning Services  CM Consult, Medication Assistance, [place], [facility]  Post Acute Care Choice:  Home Health, Durable Medical Equipment Choice offered to:  Patient  DME Arranged:  [place] [facility] DME Agency:  [place]:  RN [facility] Agency:  [place]  Status of Service:  In process, will continue to follow  If discussed at Long Length of Stay Meetings, dates discussed:    Additional Comments:  Marilu Favre, RN 02/27/2016, 10:32 AM

## 2016-02-27 NOTE — Progress Notes (Signed)
Inpatient Diabetes Program Recommendations  AACE/ADA: New Consensus Statement on Inpatient Glycemic Control (2015)  Target Ranges:  Prepandial:   less than 140 mg/dL      Peak postprandial:   less than 180 mg/dL (1-2 hours)      Critically ill patients:  140 - 180 mg/dL   Results for [name] [name], [name] [name] (MRN [id number]) as of 02/27/2016 11:23  Ref. Range 02/26/2016 07:47 02/26/2016 14:34 02/26/2016 17:14 02/26/2016 22:03 02/27/2016 01:50 02/27/2016 05:55 02/27/2016 09:40  Glucose-Capillary Latest Ref Range: 65 - 99 mg/dL 290 (H) 361 (H) 222 (H) 210 (H) 282 (H) 191 (H) 238 (H)   Review of Glycemic Control  Diabetes history: DM2 Outpatient Diabetes medications: Metformin 1000 mg QHS, Lantus 35 units BID (however, has not been taking Lantus due to cost) Current orders for Inpatient glycemic control: Lantus 25 units BID, Novolog 0-20 units Q4H, Novolog 10 units TID with meals for meal coverage  Inpatient Diabetes Program Recommendations: Insulin - Basal: Diabetes Coordinator talked with patient on 02/23/16 (see note for details) regarding DM. Patient is not able to afford Lantus and will need an affordable insulin for outpatient DM control. Please consider discontinuing Lantus and ordering 70/30 40 units BID starting with supper today (which will provide a total of 56 units for basal and 24 units for meal coverage per day). Insulin - Meal Coverage: If 70/30 is ordered as recommended, please discontinue Novolog 10 units TID for meal coverage since 30% of the 70/30 insulin is intended to be for meal coverage. Outpatient DM regimen: MD, please consider discharging patient on NOVOLIN 70/30 insulin which patient can purchase for $25 per vial.  Thanks, Barnie Alderman, RN, MSN, CDE Diabetes Coordinator Inpatient Diabetes Program [phone] (Team Pager from 8am to 5pm)

## 2016-02-28 DIAGNOSIS — E118 Type 2 diabetes mellitus with unspecified complications: Secondary | ICD-10-CM

## 2016-02-28 DIAGNOSIS — L03317 Cellulitis of buttock: Secondary | ICD-10-CM

## 2016-02-28 DIAGNOSIS — A409 Streptococcal sepsis, unspecified: Principal | ICD-10-CM

## 2016-02-28 DIAGNOSIS — A419 Sepsis, unspecified organism: Secondary | ICD-10-CM

## 2016-02-28 DIAGNOSIS — N179 Acute kidney failure, unspecified: Secondary | ICD-10-CM

## 2016-02-28 DIAGNOSIS — B952 Enterococcus as the cause of diseases classified elsewhere: Secondary | ICD-10-CM

## 2016-02-28 DIAGNOSIS — Z794 Long term (current) use of insulin: Secondary | ICD-10-CM

## 2016-02-28 DIAGNOSIS — E1310 Other specified diabetes mellitus with ketoacidosis without coma: Secondary | ICD-10-CM

## 2016-02-28 DIAGNOSIS — E1165 Type 2 diabetes mellitus with hyperglycemia: Secondary | ICD-10-CM

## 2016-02-28 LAB — BASIC METABOLIC PANEL
ANION GAP: 8 (ref 5–15)
BUN: 9 mg/dL (ref 6–20)
CALCIUM: 8.4 mg/dL — AB (ref 8.9–10.3)
CO2: 29 mmol/L (ref 22–32)
Chloride: 98 mmol/L — ABNORMAL LOW (ref 101–111)
Creatinine, Ser: 0.8 mg/dL (ref 0.61–1.24)
GLUCOSE: 186 mg/dL — AB (ref 65–99)
POTASSIUM: 3.9 mmol/L (ref 3.5–5.1)
Sodium: 135 mmol/L (ref 135–145)

## 2016-02-28 LAB — GLUCOSE, CAPILLARY
GLUCOSE-CAPILLARY: 164 mg/dL — AB (ref 65–99)
GLUCOSE-CAPILLARY: 132 mg/dL — AB (ref 65–99)
GLUCOSE-CAPILLARY: 180 mg/dL — AB (ref 65–99)
Glucose-Capillary: 208 mg/dL — ABNORMAL HIGH (ref 65–99)
Glucose-Capillary: 221 mg/dL — ABNORMAL HIGH (ref 65–99)
Glucose-Capillary: 226 mg/dL — ABNORMAL HIGH (ref 65–99)

## 2016-02-28 LAB — CBC WITH DIFFERENTIAL/PLATELET
BASOS ABS: 0 10*3/uL (ref 0.0–0.1)
BASOS PCT: 0 %
EOS ABS: 0.2 10*3/uL (ref 0.0–0.7)
Eosinophils Relative: 1 %
HCT: 29.9 % — ABNORMAL LOW (ref 39.0–52.0)
HEMOGLOBIN: 9.9 g/dL — AB (ref 13.0–17.0)
LYMPHS PCT: 16 %
Lymphs Abs: 2.5 10*3/uL (ref 0.7–4.0)
MCH: 28.9 pg (ref 26.0–34.0)
MCHC: 33.1 g/dL (ref 30.0–36.0)
MCV: 87.4 fL (ref 78.0–100.0)
MONO ABS: 0.9 10*3/uL (ref 0.1–1.0)
Monocytes Relative: 6 %
Neutro Abs: 12 10*3/uL — ABNORMAL HIGH (ref 1.7–7.7)
Neutrophils Relative %: 77 %
PLATELETS: 300 10*3/uL (ref 150–400)
RBC: 3.42 MIL/uL — AB (ref 4.22–5.81)
RDW: 12.4 % (ref 11.5–15.5)
WBC: 15.6 10*3/uL — AB (ref 4.0–10.5)

## 2016-02-28 LAB — LIPID PANEL
CHOL/HDL RATIO: 4.9 ratio
CHOLESTEROL: 123 mg/dL (ref 0–200)
HDL: 25 mg/dL — AB (ref >40)
LDL Cholesterol: 68 mg/dL (ref 0–99)
TRIGLYCERIDES: 150 mg/dL — AB (ref <150)
VLDL: 30 mg/dL (ref 0–40)

## 2016-02-28 LAB — VANCOMYCIN, TROUGH: Vancomycin Tr: 9 ug/mL — ABNORMAL LOW (ref 15–20)

## 2016-02-28 LAB — MAGNESIUM: MAGNESIUM: 2 mg/dL (ref 1.7–2.4)

## 2016-02-28 MED ORDER — INSULIN ASPART 100 UNIT/ML ~~LOC~~ SOLN
0.0000 [IU] | SUBCUTANEOUS | Status: DC
  Administered 2016-02-28 – 2016-02-29 (×2): 4 [IU] via SUBCUTANEOUS
  Administered 2016-02-29: 11 [IU] via SUBCUTANEOUS
  Administered 2016-02-29: 4 [IU] via SUBCUTANEOUS

## 2016-02-28 MED ORDER — VANCOMYCIN HCL 10 G IV SOLR
1250.0000 mg | Freq: Two times a day (BID) | INTRAVENOUS | Status: DC
  Administered 2016-02-28: 1250 mg via INTRAVENOUS
  Filled 2016-02-28 (×3): qty 1250

## 2016-02-28 MED ORDER — INSULIN ASPART 100 UNIT/ML ~~LOC~~ SOLN
0.0000 [IU] | Freq: Three times a day (TID) | SUBCUTANEOUS | Status: DC
  Administered 2016-02-28: 7 [IU] via SUBCUTANEOUS
  Administered 2016-02-28: 3 [IU] via SUBCUTANEOUS

## 2016-02-28 MED ORDER — INSULIN ASPART PROT & ASPART (70-30 MIX) 100 UNIT/ML ~~LOC~~ SUSP
40.0000 [IU] | Freq: Two times a day (BID) | SUBCUTANEOUS | Status: DC
  Administered 2016-02-28 – 2016-02-29 (×2): 40 [IU] via SUBCUTANEOUS
  Filled 2016-02-28 (×2): qty 10

## 2016-02-28 NOTE — Progress Notes (Signed)
Pharmacy Antibiotic Note  Shaheim Mahar is a 30 y.o. male admitted on 02/22/2016 with sepsis from cellulitis.  Currently on vancomycin monotherapy for GBS and E.facaelis sacral wound infection.   Patient's renal function has been stable.  His vancomycin trough is slightly below goal.  Lab was drawn one hour late, but expect true trough to be at most at the low end of therapeutic range.  He is afebrile and his WBC is hovering around 15.   Plan: - Increase vanc slightly to 1250mg  IV Q12H - Monitor renal fxn, clinical progress, abx LOT - Consider de-escalating further to ampicillin   Height: 5\' 7"  (170.2 cm) Weight: 215 lb 13.3 oz (97.9 kg) IBW/kg (Calculated) : 66.1  Temp (24hrs), Avg:97.8 F (36.6 C), Min:97.4 F (36.3 C), Max:98.2 F (36.8 C)   Recent Labs Lab 02/22/16 1606 02/22/16 2054 02/23/16 0033  02/24/16 0244 02/24/16 2145 02/25/16 0640 02/26/16 0420 02/27/16 0613 02/28/16 0547  WBC  --   --   --   --  32.1*  --  21.8* 20.2* 15.4* 15.6*  CREATININE  --   --  0.96  < > 0.89  --  0.74 0.88 0.82 0.80  LATICACIDVEN 2.50* 2.27* 1.7  --   --   --   --   --   --   --   VANCOTROUGH  --   --   --   --   --  41*  --   --   --   --   VANCORANDOM  --   --   --   --   --   --  15  --   --   --   < > = values in this interval not displayed.  Estimated Creatinine Clearance: 151.9 mL/min (by C-G formula based on SCr of 0.8 mg/dL).    No Known Allergies  Antimicrobials this admission:  Vanc 12/6 >> Zosyn 12/6 >> 12/12 Cefazolin 12/6 x 1 Clinda 2/6 x 1  Dose adjustments this admission:  12/8 VT 39 on 1g q8 (Received 93mL of next dose before this resulted) >> 1g q12 12/9 VR 15 >> con't 1g q12 12/12 VT 9 mcg/mL (drawn 1 hr late) > 1250mg  q12  Microbiology results:  12/6 Wound: GBS 12/6 Blood: negative 12/7 MRSA: negative 12/8 wound (intra-op):  GBS + E.faecalis (pan sens)   Netty Sullivant D. Mina Marble, PharmD, BCPS Pager:  [phone] 02/28/2016, 1:14 PM

## 2016-02-28 NOTE — Progress Notes (Addendum)
Progress Note    [name] [name]  OHK:[id number] DOB: Jun 27, 1985  DOA: 02/22/2016 PCP: No PCP Per Patient    Brief Narrative:   Chief complaint: F/U sacral wound  [name] [name] is an 30 y.o. male with a PMH of DM type 2 uncontrolled with complications, HTN, MRSA skin infection, nonadherence to his medications, who was admitted 02/22/16 with a sacral skin infection. CT of the pelvis revealed subcutaneous gas overlying the coccyx and right gluteal region with no discrete abscess or drainable collection. Underwent debridement 02/24/16 and has been receiving IV antibiotics and hydrotherapy.  Assessment/Plan:   Principal problem: Sepsis due to cellulitis/Ulcerated Sacral wound,  Status post debridement by surgical team 02/24/16, continue hydrotherapy and wound care. Wound cultures grew enterococcus and group B strep.  Continue vancomycin.  D/C Zosyn. Will need wound care at wound clinic at D/C. Will ask CM to set up.  Active Problems: DM  Type 2 uncontrolled with complication/DKA.  Hemoglobin A13 is 13.7%.  DKA resolved with usual treatment.  Currently being managed with 30 units of lantus daily, Insulin resistant SSI Q 4 hours, and 15 units of NovoLOG Q AC. CBGs [id number]. Seen by DM coordinator with recommendations to change insulin to 70/30 40 units BID due to inability to afford Lantus. Will make recommended changes. SW consulted to assist with medications.   Acute renal failure  Likely from osmotic diuresis and sepsis.  Resolved.  Family Communication/Anticipated D/C date and plan/Code Status   DVT prophylaxis: Early ambulation. Code Status: Full Code.  Family Communication: No family at bedside. Disposition Plan: Home when stable, likely 24-48 hours.  Need to monitor glycemic control with switch to 70/30.   Medical Consultants:    Surgery   Procedures:    02/24/16 S/p SACRAL WOUND DEBRIDEMENT  Anti-Infectives:    Clindamycin 12/6 1 dose  Zosyn  12/6>>02/28/16  Vancomycin 12/6>>  Subjective:   The patient reports that his muscles feel stiff around the sacral area. Review of symptoms is positive for chronic watery diarrhea and negative for nausea/vomiting.  Objective:    Vitals:   02/27/16 1315 02/27/16 2034 02/28/16 0200 02/28/16 0518  BP: 129/77 117/74 120/69 123/77  Pulse: 91 94 96 84  Resp:  16 18 18   Temp: 98 F (36.7 C) 98.2 F (36.8 C) 97.6 F (36.4 C) 97.4 F (36.3 C)  TempSrc: Oral Oral Oral Oral  SpO2: 100% 98% 98% 100%  Weight:      Height:        Intake/Output Summary (Last 24 hours) at 02/28/16 0850 Last data filed at 02/28/16 0436  Gross per 24 hour  Intake             1205 ml  Output                0 ml  Net             1205 ml   Filed Weights   02/23/16 0520 02/25/16 0402  Weight: 98.7 kg (217 lb 9.5 oz) 97.9 kg (215 lb 13.3 oz)    Exam: General exam: Appears calm and comfortable.  Respiratory system: Clear to auscultation. Respiratory effort normal. Cardiovascular system: S1 & S2 heard, RRR. No JVD,  rubs, gallops or clicks. No murmurs. Gastrointestinal system: Abdomen is nondistended, soft and nontender. No organomegaly or masses felt. Normal bowel sounds heard. Central nervous system: Alert and oriented. No focal neurological deficits. Extremities: No clubbing,  or cyanosis. No edema. Skin: Sacral wound covered with  dressings, not examined. But wound as documented below. Psychiatry: Judgement and insight appear normal. Mood & affect appropriate.      Data Reviewed:   I have personally reviewed following labs and imaging studies:  Labs: Basic Metabolic Panel:  Recent Labs Lab 02/24/16 0244 02/25/16 0640 02/26/16 0420 02/27/16 0613 02/28/16 0547  NA 129* 132* 131* 134* 135  K 3.7 3.5 3.9 3.7 3.9  CL 96* 96* 94* 96* 98*  CO2 23 31 29 31 29   GLUCOSE 374* 311* 335* 206* 186*  BUN 9 9 9 8 9   CREATININE 0.89 0.74 0.88 0.82 0.80  CALCIUM 8.3* 8.0* 8.0* 8.2* 8.4*  MG  --   --    --   --  2.0   GFR Estimated Creatinine Clearance: 151.9 mL/min (by C-G formula based on SCr of 0.8 mg/dL). Liver Function Tests: No results for input(s): AST, ALT, ALKPHOS, BILITOT, PROT, ALBUMIN in the last 168 hours. No results for input(s): LIPASE, AMYLASE in the last 168 hours. No results for input(s): AMMONIA in the last 168 hours. Coagulation profile  Recent Labs Lab 02/23/16 0033  INR 1.12    CBC:  Recent Labs Lab 02/24/16 0244 02/25/16 0640 02/26/16 0420 02/27/16 0613 02/28/16 0547  WBC 32.1* 21.8* 20.2* 15.4* 15.6*  NEUTROABS 27.7* 17.5* 15.6* 12.1* 12.0*  HGB 10.9* 10.3* 9.7* 9.9* 9.9*  HCT 30.5* 30.2* 28.5* 28.7* 29.9*  MCV 84.0 86.8 86.9 86.7 87.4  PLT 310 260 244 273 300   Cardiac Enzymes:  Recent Labs Lab 02/23/16 0215  TROPONINI 0.17*   BNP (last 3 results) No results for input(s): PROBNP in the last 8760 hours. CBG:  Recent Labs Lab 02/27/16 1313 02/27/16 1714 02/27/16 2127 02/28/16 0159 02/28/16 0530  GLUCAP 295* 246* 200* 208* 164*   D-Dimer: No results for input(s): DDIMER in the last 72 hours. Hgb A1c: No results for input(s): HGBA1C in the last 72 hours. Lipid Profile:  Recent Labs  02/28/16 0547  CHOL 123  HDL 25*  LDLCALC 68  TRIG 150*  CHOLHDL 4.9   Thyroid function studies: No results for input(s): TSH, T4TOTAL, T3FREE, THYROIDAB in the last 72 hours.  Invalid input(s): FREET3 Anemia work up: No results for input(s): VITAMINB12, FOLATE, FERRITIN, TIBC, IRON, RETICCTPCT in the last 72 hours. Sepsis Labs:  Recent Labs Lab 02/22/16 1606 02/22/16 2054 02/23/16 0033  02/25/16 0640 02/26/16 0420 02/27/16 0613 02/28/16 0547  PROCALCITON  --   --  0.98  --   --   --   --   --   WBC  --   --   --   < > 21.8* 20.2* 15.4* 15.6*  LATICACIDVEN 2.50* 2.27* 1.7  --   --   --   --   --   < > = values in this interval not displayed.  Microbiology Recent Results (from the past 240 hour(s))  Blood Culture (routine x  2)     Status: None   Collection Time: 02/22/16  3:19 PM  Result Value Ref Range Status   Specimen Description BLOOD LEFT HAND  Final   Special Requests BOTTLES DRAWN AEROBIC AND ANAEROBIC 5CC  Final   Culture NO GROWTH 5 DAYS  Final   Report Status 02/27/2016 FINAL  Final  Blood Culture (routine x 2)     Status: None   Collection Time: 02/22/16  3:24 PM  Result Value Ref Range Status   Specimen Description BLOOD RIGHT HAND  Final   Special Requests BOTTLES  DRAWN AEROBIC AND ANAEROBIC 5CC  Final   Culture NO GROWTH 5 DAYS  Final   Report Status 02/27/2016 FINAL  Final  Aerobic Culture (superficial specimen)     Status: None   Collection Time: 02/22/16  6:36 PM  Result Value Ref Range Status   Specimen Description WOUND SACRAL  Final   Special Requests NONE  Final   Gram Stain NO WBC SEEN MODERATE GRAM POSITIVE COCCI   Final   Culture   Final    MODERATE STREPTOCOCCUS AGALACTIAE TESTING AGAINST S. AGALACTIAE NOT ROUTINELY PERFORMED DUE TO PREDICTABILITY OF AMP/PEN/VAN SUSCEPTIBILITY.    Report Status 02/24/2016 FINAL  Final  MRSA PCR Screening     Status: None   Collection Time: 02/23/16  5:26 AM  Result Value Ref Range Status   MRSA by PCR NEGATIVE NEGATIVE Final    Comment:        The GeneXpert MRSA Assay (FDA approved for NASAL specimens only), is one component of a comprehensive MRSA colonization surveillance program. It is not intended to diagnose MRSA infection nor to guide or monitor treatment for MRSA infections.   Anaerobic culture     Status: None (Preliminary result)   Collection Time: 02/24/16 10:26 AM  Result Value Ref Range Status   Specimen Description WOUND SACRAL  Final   Special Requests SPEC A  Final   Culture   Final    NO ANAEROBES ISOLATED; CULTURE IN PROGRESS FOR 5 DAYS   Report Status PENDING  Incomplete  Aerobic Culture (superficial specimen)     Status: None   Collection Time: 02/24/16 10:26 AM  Result Value Ref Range Status   Specimen  Description WOUND SACRAL  Final   Special Requests SPEC A  Final   Gram Stain   Final    ABUNDANT WBC PRESENT, PREDOMINANTLY PMN ABUNDANT GRAM POSITIVE COCCOBACILLUS    Culture   Final    MODERATE ENTEROCOCCUS FAECALIS MODERATE GROUP B STREP(S.AGALACTIAE)ISOLATED TESTING AGAINST S. AGALACTIAE NOT ROUTINELY PERFORMED DUE TO PREDICTABILITY OF AMP/PEN/VAN SUSCEPTIBILITY.    Report Status 02/27/2016 FINAL  Final   Organism ID, Bacteria ENTEROCOCCUS FAECALIS  Final      Susceptibility   Enterococcus faecalis - MIC*    AMPICILLIN <=2 SENSITIVE Sensitive     VANCOMYCIN 2 SENSITIVE Sensitive     GENTAMICIN SYNERGY SENSITIVE Sensitive     * MODERATE ENTEROCOCCUS FAECALIS    Radiology: No results found.  Medications:   . enoxaparin (LOVENOX) injection  40 mg Subcutaneous Q24H  . feeding supplement (PRO-STAT SUGAR FREE 64)  30 mL Oral BID  . insulin aspart  0-20 Units Subcutaneous Q4H  . insulin aspart  15 Units Subcutaneous TID WC  . insulin glargine  30 Units Subcutaneous BID  . multivitamin with minerals  1 tablet Oral Daily  . pantoprazole  40 mg Oral Daily  . piperacillin-tazobactam (ZOSYN)  IV  3.375 g Intravenous Q8H  . vancomycin  1,000 mg Intravenous Q12H   Continuous Infusions: . sodium chloride 75 mL/hr at 02/26/16 2113    Medical decision making is moderately complex therefore this is a level 2 visit.     LOS: 6 days   [facility] Pager [phone]. If unable to reach me by pager, please call my cell phone at [phone].  *Please refer to [email or web address], password [email or web address] to get updated schedule on who will round on this patient, as hospitalists switch teams weekly. If 7PM-7AM, please contact night-coverage at [email or web address], password [email or web address] for any overnight needs.  02/28/2016, 8:50 AM

## 2016-02-28 NOTE — Progress Notes (Signed)
4 Days Post-Op  Subjective: No real change  Objective: Vital signs in last 24 hours: Temp:  [97.4 F (36.3 C)-98.2 F (36.8 C)] 97.4 F (36.3 C) (12/12 0518) Pulse Rate:  [84-96] 84 (12/12 0518) Resp:  [16-18] 18 (12/12 0518) BP: (117-129)/(69-77) 123/77 (12/12 0518) SpO2:  [98 %-100 %] 100 % (12/12 0518) Last BM Date: 02/27/16 235 PO 720 IV Voided x 3 Stool x 1 Afebrile, VSS wBC is still up, Glucose is improving Intake/Output from previous day: 12/11 0701 - 12/12 0700 In: 1205 [P.O.:235; I.V.:720; IV Piggyback:250] Out: -  Intake/Output this shift: No intake/output data recorded.  General appearance: alert, cooperative and no distress Skin: see picture below, still has allot of slough and dressing is soaked with purulent materiall    Lab Results:   Recent Labs  02/27/16 0613 02/28/16 0547  WBC 15.4* 15.6*  HGB 9.9* 9.9*  HCT 28.7* 29.9*  PLT 273 300    BMET  Recent Labs  02/27/16 0613 02/28/16 0547  NA 134* 135  K 3.7 3.9  CL 96* 98*  CO2 31 29  GLUCOSE 206* 186*  BUN 8 9  CREATININE 0.82 0.80  CALCIUM 8.2* 8.4*   PT/INR No results for input(s): LABPROT, INR in the last 72 hours.  No results for input(s): AST, ALT, ALKPHOS, BILITOT, PROT, ALBUMIN in the last 168 hours.   Lipase     Component Value Date/Time   LIPASE 16 02/16/2016 2000     Studies/Results: No results found.  Medications: . enoxaparin (LOVENOX) injection  40 mg Subcutaneous Q24H  . feeding supplement (PRO-STAT SUGAR FREE 64)  30 mL Oral BID  . insulin aspart  0-20 Units Subcutaneous Q4H  . insulin aspart  15 Units Subcutaneous TID WC  . insulin glargine  30 Units Subcutaneous BID  . multivitamin with minerals  1 tablet Oral Daily  . pantoprazole  40 mg Oral Daily  . vancomycin  1,000 mg Intravenous Q12H   . sodium chloride 75 mL/hr at 02/26/16 2113   Specimen Description WOUND SACRAL   Special Requests SPEC A   Gram Stain ABUNDANT WBC PRESENT, PREDOMINANTLY PMN  ABUNDANT GRAM POSITIVE COCCOBACILLUS   Culture MODERATE ENTEROCOCCUS FAECALIS  MODERATE GROUP B STREP(S.AGALACTIAE)ISOLATED  TESTING AGAINST S. AGALACTIAE NOT ROUTINELY PERFORMED DUE TO PREDICTABILITY OF AMP/PEN/VAN SUSCEPTIBILITY.       Report Status 02/27/2016 FINAL   Organism ID, Bacteria ENTEROCOCCUS FAECALIS   Resulting Agency [facility]  Susceptibility    Enterococcus faecalis    MIC    AMPICILLIN <=2 SENSITIVE "><=2 SENSITIVE  Sensitive    GENTAMICIN SYNERGY SENSITIVE  Sensitive    VANCOMYCIN 2 SENSITIVE  Sensitive        Assessment/Plan SACRAL WOUND S/p SACRAL WOUND DEBRIDEMENT, 02/24/16, Dr. Georganna Skeans AODM - poor control - DKA Acute kidney injury FEN: IV fluids/Carb Mod diet ID: day 7 Vancomycin and Zosyn completed 6 days; d/ced 02/28/16 DVT: Lovenox   Plan:  Continue wet to dry, hydrotherapy, vancomycin.  Will refer to Wound center at discharge.    LOS: 6 days    Demere Dotzler 02/28/2016 [phone]

## 2016-02-28 NOTE — Progress Notes (Signed)
Physical Therapy Treatment and Discharge Patient Details Name: [name] [name] MRN: [id number] DOB: [birth year] Today's Date: 02/28/2016    History of Present Illness 30 y.o.maleadmittedon 12/6/2017with sepsis and cellulitis now S/p sacral wound debridement on 02/24/16. PMH: dieabetes, HTN, asthma.     PT Comments    Pt reports he is feeling much better since PT mobility eval, and is managing independently and back to baseline; All acute PT goals met; will sign off  Follow Up Recommendations  No PT follow up;Supervision - Intermittent     Equipment Recommendations  None recommended by PT    Recommendations for Other Services       Precautions / Restrictions Precautions Precaution Comments: sacral wound    Mobility  Bed Mobility Overal bed mobility: Modified Independent             General bed mobility comments: Managing well  Transfers Overall transfer level: Modified independent Equipment used: None Transfers: Sit to/from Stand Sit to Stand: Modified independent (Device/Increase time)            Ambulation/Gait Ambulation/Gait assistance: Supervision;Modified independent (Device/Increase time) Ambulation Distance (Feet): 550 Feet Assistive device: None (and pushing IV pole) Gait Pattern/deviations: Step-through pattern     General Gait Details: Supervision progressing to modified independent; Pt tells me he walks the hallways a few times a day independently; Continued gait assymetries, poor R foot dorsiflexion, RLE toe-ing out; pt tells me this is his baseline gait pattern (remote injury to R LE)   Stairs Stairs: Yes   Stair Management: One rail Left;Forwards;Alternating pattern;Step to pattern (alternating pattern ascending; step-to descending) Number of Stairs: 4 (limited by IV line) General stair comments: took stairs slowly; tells me this is how he manages at home  Wheelchair Mobility    Modified Rankin (Stroke Patients Only)       Balance      Sitting balance-Leahy Scale: Normal       Standing balance-Leahy Scale: Good Standing balance comment: Has been managing independently in his hospital room without loss of balance                    Cognition Arousal/Alertness: Awake/alert Behavior During Therapy: WFL for tasks assessed/performed Overall Cognitive Status: Within Functional Limits for tasks assessed                      Exercises      General Comments        Pertinent Vitals/Pain Pain Assessment: No/denies pain    Home Living                      Prior Function            PT Goals (current goals can now be found in the care plan section) Acute Rehab PT Goals Patient Stated Goal: get back home Progress towards PT goals: Goals met/education completed, patient discharged from PT    Frequency    Min 3X/week      PT Plan Current plan remains appropriate    Co-evaluation             End of Session   Activity Tolerance: Patient tolerated treatment well Patient left: Other (comment) (managing independently in room)     Time: 7371-0626 PT Time Calculation (min) (ACUTE ONLY): 9 min  Charges:  $Gait Training: 8-22 mins                    G Codes:  Colletta [place] 02/28/2016, 4:37 PM  Roney [place], [place] Pager [phone] Office [phone]

## 2016-02-28 NOTE — Progress Notes (Signed)
Physical Therapy Wound Treatment Patient Details  Name: [name] [name] MRN: [id number] Date of Birth: [birth year]  Today's Date: 02/28/2016 Time: 3710-6269 Time Calculation (min): 43 min  Subjective  Subjective: "Is it getting better?" Patient and Family Stated Goals: Get wound better Date of Onset:  (3 wks ago) Prior Treatments: surgical I&D  Pain Score:  Pt was premedicated  Wound Assessment  Wound / Incision (Open or Dehisced) 02/23/16 Non-pressure wound Sacrum Medial s/p I&D (Active)  Dressing Type Gauze (Comment);Barrier Film (skin prep);ABD;Moist to dry 02/28/2016 10:43 AM  Dressing Changed Changed 02/28/2016 10:43 AM  Dressing Status Clean;Dry;Intact 02/28/2016 10:43 AM  Dressing Change Frequency Twice a day 02/28/2016 10:43 AM  Site / Wound Assessment Pink;Yellow;Pale;Black 02/28/2016 10:43 AM  % Wound base Red or Granulating 40% 02/28/2016 10:43 AM  % Wound base Yellow/Fibrinous Exudate 55% 02/28/2016 10:43 AM  % Wound base Black/Eschar 5% 02/28/2016 10:43 AM  % Wound base Other/Granulation Tissue (Comment) 0% 02/28/2016 10:43 AM  Peri-wound Assessment Induration 02/28/2016 10:43 AM  Wound Length (cm) 9.5 cm 02/27/2016  9:45 AM  Wound Width (cm) 5 cm 02/27/2016  9:45 AM  Wound Depth (cm) 5.5 cm 02/27/2016  9:45 AM  Margins Unattached edges (unapproximated) 02/28/2016 10:43 AM  Closure None 02/28/2016 10:43 AM  Drainage Amount Moderate 02/28/2016 10:43 AM  Drainage Description Purulent;No odor 02/28/2016 10:43 AM  Treatment Debridement (Selective);Hydrotherapy (Pulse lavage);Packing (Saline gauze) 02/28/2016 10:43 AM   Hydrotherapy Pulsed lavage therapy - wound location: sacrum Pulsed Lavage with Suction (psi): 12 psi Pulsed Lavage with Suction - Normal Saline Used: 1000 mL Pulsed Lavage Tip: Tip with splash shield Selective Debridement Selective Debridement - Location: sacrum Selective Debridement - Tools Used: Forceps;Scissors Selective Debridement - Tissue Removed:  yellow slough   Wound Assessment and Plan  Wound Therapy - Assess/Plan/Recommendations Wound Therapy - Clinical Statement: Pt will continue to benefit from hydrotherapy for removal of necrotic tissue and to decrease bioburden.  Wound Therapy - Functional Problem List: Decr sitting tolerance Factors Delaying/Impairing Wound Healing: Diabetes Mellitus;Infection - systemic/local Hydrotherapy Plan: Debridement;Dressing change;Patient/family education;Pulsatile lavage with suction Wound Therapy - Frequency: 6X / week Wound Therapy - Follow Up Recommendations: Home health RN Wound Plan: See above  Wound Therapy Goals- Improve the function of patient's integumentary system by progressing the wound(s) through the phases of wound healing (inflammation - proliferation - remodeling) by: Decrease Necrotic Tissue to: 20 Decrease Necrotic Tissue - Progress: Progressing toward goal Increase Granulation Tissue to: 80 Increase Granulation Tissue - Progress: Progressing toward goal  Goals will be updated until maximal potential achieved or discharge criteria met.  Discharge criteria: when goals achieved, discharge from hospital, MD decision/surgical intervention, no progress towards goals, refusal/missing three consecutive treatments without notification or medical reason.  GP     Thelma Comp 02/28/2016, 10:55 AM   Rolinda Roan, PT, DPT Acute Rehabilitation Services Pager: [phone]

## 2016-02-28 NOTE — Care Management Note (Addendum)
Case Management Note  Patient Details  Name: [name] [name] MRN: [id number] Date of Birth: May 09, 1985  Subjective/Objective:                    Action/Plan:  Appointment scheduled at   [facility] [place] [place], [place] [address]   Main: [phone]   First available Friday Mar 16, 2016 at 0800, patient to arrive 5 to 10 minutes early .   Patient could have gotten earlier appointment at [facility] , however, he does not have transportation.  Patient has appointment at [facility] Mar 07, 2016 at 2 30 pm   [facility] letter medication assistance given to patient dated 02-29-16 to 03-07-16. 14 day exception entered for Vicodin . Patient voiced understanding.  Expected Discharge Date:                  Expected Discharge Plan:  [place]  In-House Referral:  Financial Counselor  Discharge planning Services  CM Consult, Medication Assistance, [place], [facility]  Post Acute Care Choice:  Home Health, Durable Medical Equipment Choice offered to:  Patient  DME Arranged:  [place] [facility] DME Agency:  [place]:  RN [facility] Agency:  [place]  Status of Service:  In process, will continue to follow  If discussed at Long Length of Stay Meetings, dates discussed:    Additional Comments:  Marilu Favre, RN 02/28/2016, 1:25 PM

## 2016-02-29 DIAGNOSIS — I10 Essential (primary) hypertension: Secondary | ICD-10-CM

## 2016-02-29 LAB — ANAEROBIC CULTURE

## 2016-02-29 LAB — GLUCOSE, CAPILLARY
GLUCOSE-CAPILLARY: 184 mg/dL — AB (ref 65–99)
GLUCOSE-CAPILLARY: 152 mg/dL — AB (ref 65–99)
GLUCOSE-CAPILLARY: 265 mg/dL — AB (ref 65–99)
Glucose-Capillary: 105 mg/dL — ABNORMAL HIGH (ref 65–99)

## 2016-02-29 LAB — CREATININE, SERUM
Creatinine, Ser: 0.77 mg/dL (ref 0.61–1.24)
GFR calc Af Amer: >60 mL/min (ref >60)
GFR calc non Af Amer: >60 mL/min (ref >60)

## 2016-02-29 MED ORDER — INSULIN ASPART PROT & ASPART (70-30 MIX) 100 UNIT/ML ~~LOC~~ SUSP: 40.0000 [IU] | Freq: Two times a day (BID) | SUBCUTANEOUS | 11 refills | Status: DC

## 2016-02-29 MED ORDER — AMOXICILLIN-POT CLAVULANATE 875-125 MG PO TABS
1.0000 | ORAL_TABLET | Freq: Two times a day (BID) | ORAL | Status: DC
Start: 2016-02-29 — End: 2016-02-29
  Administered 2016-02-29: 1 via ORAL
  Filled 2016-02-29: qty 1

## 2016-02-29 MED ORDER — OXYCODONE HCL 5 MG PO TABS: 5.0000 mg | ORAL_TABLET | Freq: Four times a day (QID) | ORAL | 0 refills | Status: DC | PRN

## 2016-02-29 MED ORDER — AMOXICILLIN-POT CLAVULANATE 875-125 MG PO TABS: 1.0000 | ORAL_TABLET | Freq: Two times a day (BID) | ORAL | 0 refills | Status: DC

## 2016-02-29 NOTE — Progress Notes (Signed)
Nutrition Follow-up  DOCUMENTATION CODES:   Obesity unspecified  INTERVENTION:   -Continue MVI -Continue 30 ml Prostat BID, each supplement provides 100 kcals and 15 grams protein -Continue to reinforce DM self-management education as appropriate  NUTRITION DIAGNOSIS:   Increased nutrient needs related to wound healing as evidenced by estimated needs.  Progressing  GOAL:   Patient will meet greater than or equal to 90% of their needs  Progressing  MONITOR:   PO intake, Supplement acceptance, I & O's, Skin  REASON FOR ASSESSMENT:   Consult Wound healing  ASSESSMENT:   Pt with PMH significant for type 2 DM, HTN, history of MRSA skin infection, and nonadherence to his medications, now presenting to the emergency department with several days of progressive fatigue, weakness, malaise, and pain at the site of a boil overlying the sacrum.  S/p I&D of sacral wound on 02/24/16. Hydrotherapy initiated on 02/27/16. Per MD notes, plan to continue hydrotherapy and secure follow-up at [place] at discharge.   Pt sleeping soundly at time of visit. RD did not disturb.   Pt's appetite continues to be good. Noted 100% meal completion. Pt continues to make MVI and Prostat supplements as ordered. RD will continue MVI and supplements to support wound healing.   Labs reviewed: CBGS: 105-265.   Diet Order:  Diet Carb Modified Fluid consistency: Thin; Room service appropriate? Yes Diet - low sodium heart healthy Diet Carb Modified  Skin:  Wound (see comment) (non-pressure wound to sacrum)  Last BM:  02/28/16  Height:   Ht Readings from Last 1 Encounters:  02/23/16 5\' 7"  (1.702 m)    Weight:   Wt Readings from Last 1 Encounters:  02/25/16 215 lb 13.3 oz (97.9 kg)    Ideal Body Weight:  67.2 kg  BMI:  Body mass index is 33.8 kg/m.  Estimated Nutritional Needs:   Kcal:  2000-2200  Protein:  115-130 grams  Fluid:  > 2.0 L/day  EDUCATION NEEDS:   Education  needs addressed  Jessaca Philippi A. Jimmye Norman, RD, LDN, CDE Pager: [phone] After hours Pager: [phone]

## 2016-02-29 NOTE — Discharge Summary (Addendum)
Physician Discharge Summary  Tarrin Lebow TKZ:[id number] DOB: October 09, 1985 DOA: 02/22/2016  PCP: No PCP Per Patient, referred to the [facility].  Admit date: 02/22/2016 Discharge date: 02/29/2016  Admitted From: Home Discharge disposition: Home   Recommendations for Outpatient Follow-Up:   1. The patient will follow up at the wound care center. 2. Home health nursing has also been set up. 3. Follow-up pressure check at next visit. Also needs assessment of glycemic control and adjustment of insulin if necessary.   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  Diet recommendation: Low sodium, heart healthy.  Carbohydrate-modified.    History of Present Illness:   [name] [name] is an 30 y.o. male with a PMH of DMtype 2 uncontrolled with complications, HTN, MRSA skin infection, nonadherence to his medications, who was admitted 02/22/16 with a sacral skin infection. CT of the pelvis revealed subcutaneous gas overlying the coccyx and right gluteal region with no discrete abscess or drainable collection. Underwent debridement 02/24/16 and has been receiving IV antibiotics and hydrotherapy.  Hospital Course by Problem:   Principal problem: Sepsis due to cellulitis/Ulcerated Sacral wound, Status post debridement by surgical team 02/24/16, continue hydrotherapy and wound care. Wound cultures grew enterococcus and group B strep.  Blood cultures were negative. Initially treated with vancomycin and Zosyn, will discharge home on  an additional one week of therapy with Augmentin. Will need wound care at wound clinic at D/C. Will ask CM to set up.  Active Problems: DM Type 2 uncontrolled with complication/DKA.  Hemoglobin A13 is 13.7%.  DKA resolved with usual treatment. Seen by DM coordinator with recommendations to change insulin to 70/30 40 units BID due to inability to afford Lantus. Reasonable control on this regimen, with CBG range 105-265 (265 is an outlier).  Acute renal failure  Likely from osmotic diuresis and sepsis.  Resolved.    Normocytic anemia   Likely anemia of chronic disease. Hemoglobin stable at discharge.    Hypertension   Patient has been noncompliant with taking Norvasc. Recommend close follow-up with PCP. Blood pressure reasonable off   antihypertensives.  Medical Consultants:    Surgery   Discharge Exam:   Vitals:   02/29/16 0511 02/29/16 1337  BP: 124/81 116/87  Pulse: 87 83  Resp: 18 18  Temp: 97.9 F (36.6 C) 97.9 F (36.6 C)   Vitals:   02/28/16 1310 02/28/16 2100 02/29/16 0511 02/29/16 1337  BP: (!) 144/88 140/88 124/81 116/87  Pulse: 91 85 87 83  Resp: 20 18 18 18   Temp: 98.4 F (36.9 C) 98.2 F (36.8 C) 97.9 F (36.6 C) 97.9 F (36.6 C)  TempSrc: Oral Oral Oral Oral  SpO2: 100% 100% 100% 98%  Weight:      Height:       General exam: Appears calm and comfortable.  Respiratory system: Clear to auscultation. Respiratory effort normal. Cardiovascular system: S1 & S2 heard, RRR. No JVD,  rubs, gallops or clicks. No murmurs. Gastrointestinal system: Abdomen is nondistended, soft and nontender.  No organomegaly or masses felt. Normal bowel sounds heard. Central nervous system: Alert and oriented. No focal neurological deficits. Extremities: No clubbing,  or cyanosis. No edema. Skin: Sacral wound covered with dressings, not examined. But wound as documented below. Psychiatry: Judgement and insight appear normal. Mood & affect depressed.    The results of  significant diagnostics from this hospitalization (including imaging, microbiology, ancillary and laboratory) are listed below for reference.     Procedures and Diagnostic Studies:   Ct Pelvis W Contrast  Result Date: 02/22/2016 CLINICAL DATA:  Sacral wound, status post drainage x2 EXAM: CT PELVIS WITH CONTRAST TECHNIQUE: Multidetector CT imaging of the pelvis was performed using the standard protocol following the bolus administration of intravenous contrast. CONTRAST:  164mL ISOVUE-300 IOPAMIDOL (ISOVUE-300) INJECTION 61% COMPARISON:  02/16/2016 FINDINGS: Urinary Tract:  Bladder is within normal limits. Bowel: Visualized bowel is unremarkable. Specifically, the rectum is within normal limits. Vascular/Lymphatic: Iliac arteries are within normal limits. No suspicious pelvic lymphadenopathy. Reproductive:  Prostate is within normal limits. Other:  No pelvic ascites. Subcutaneous gas overlying the coccyx (series 2/image 30) and right gluteal region (series 2/ image 20). Associated subcutaneous stranding. Minimal subcutaneous fluid may be present (series 2/ image 33), but this is not reflect a well-defined fluid collection/abscess. Minimal fat within the right inguinal canal. Musculoskeletal: No focal osseous lesions. IMPRESSION: Subcutaneous gas overlying the coccyx and right gluteal region. Differential considerations include postprocedural change versus infection related to gas-forming organism. Minimal subcutaneous fluid with stranding, favored to reflect cellulitis with phlegmonous change. No well-defined fluid collection/abscess. Electronically Signed   By: Julian Hy M.D.   On: 02/22/2016 16:35   Dg Chest Port 1 View  Result Date: 02/22/2016 CLINICAL DATA:  Weakness and dyspnea.  Sepsis. EXAM: PORTABLE CHEST 1 VIEW COMPARISON:  None. FINDINGS: A single AP portable view of the chest demonstrates no focal airspace consolidation or alveolar edema. The lungs are grossly clear. There is no large  effusion or pneumothorax. Cardiac and mediastinal contours appear unremarkable. IMPRESSION: No active disease. Electronically Signed   By: Andreas Newport M.D.   On: 02/22/2016 23:03     Labs:   Basic Metabolic Panel:  Recent Labs Lab 02/24/16 0244 02/25/16 0640 02/26/16 0420 02/27/16 0613 02/28/16 0547 02/29/16 0513  NA 129* 132* 131* 134* 135  --   K 3.7 3.5 3.9 3.7 3.9  --   CL 96* 96* 94* 96* 98*  --   CO2 23 31 29 31 29   --   GLUCOSE 374* 311* 335* 206* 186*  --   BUN 9 9 9 8 9   --   CREATININE 0.89 0.74 0.88 0.82 0.80 0.77  CALCIUM 8.3* 8.0* 8.0* 8.2* 8.4*  --   MG  --   --   --   --  2.0  --    GFR Estimated Creatinine Clearance: 151.9 mL/min (by C-G formula based on SCr of 0.77 mg/dL). Liver Function Tests: No results for input(s): AST, ALT, ALKPHOS, BILITOT, PROT, ALBUMIN in the last 168 hours. No results for input(s): LIPASE, AMYLASE in the last 168 hours. No results for input(s): AMMONIA in the last 168 hours. Coagulation profile  Recent Labs Lab 02/23/16 0033  INR 1.12    CBC:  Recent Labs Lab 02/24/16 0244 02/25/16 0640 02/26/16 0420 02/27/16 0613 02/28/16 0547  WBC 32.1* 21.8* 20.2* 15.4* 15.6*  NEUTROABS 27.7* 17.5* 15.6* 12.1* 12.0*  HGB 10.9* 10.3* 9.7* 9.9* 9.9*  HCT 30.5* 30.2* 28.5* 28.7* 29.9*  MCV 84.0  86.8 86.9 86.7 87.4  PLT 310 260 244 273 300   Cardiac Enzymes:  Recent Labs Lab 02/23/16 0215  TROPONINI 0.17*   BNP: Invalid input(s): POCBNP CBG:  Recent Labs Lab 02/28/16 2100 02/29/16 0047 02/29/16 0510 02/29/16 0722 02/29/16 1149  GLUCAP 180* 105* 184* 152* 265*   Lipid Profile  Recent Labs  02/28/16 0547  CHOL 123  HDL 25*  LDLCALC 68  TRIG 150*  CHOLHDL 4.9   Microbiology Recent Results (from the past 240 hour(s))  Blood Culture (routine x 2)     Status: None   Collection Time: 02/22/16  3:19 PM  Result Value Ref Range Status   Specimen Description BLOOD LEFT HAND  Final   Special Requests  BOTTLES DRAWN AEROBIC AND ANAEROBIC 5CC  Final   Culture NO GROWTH 5 DAYS  Final   Report Status 02/27/2016 FINAL  Final  Blood Culture (routine x 2)     Status: None   Collection Time: 02/22/16  3:24 PM  Result Value Ref Range Status   Specimen Description BLOOD RIGHT HAND  Final   Special Requests BOTTLES DRAWN AEROBIC AND ANAEROBIC 5CC  Final   Culture NO GROWTH 5 DAYS  Final   Report Status 02/27/2016 FINAL  Final  Aerobic Culture (superficial specimen)     Status: None   Collection Time: 02/22/16  6:36 PM  Result Value Ref Range Status   Specimen Description WOUND SACRAL  Final   Special Requests NONE  Final   Gram Stain NO WBC SEEN MODERATE GRAM POSITIVE COCCI   Final   Culture   Final    MODERATE STREPTOCOCCUS AGALACTIAE TESTING AGAINST S. AGALACTIAE NOT ROUTINELY PERFORMED DUE TO PREDICTABILITY OF AMP/PEN/VAN SUSCEPTIBILITY.    Report Status 02/24/2016 FINAL  Final  MRSA PCR Screening     Status: None   Collection Time: 02/23/16  5:26 AM  Result Value Ref Range Status   MRSA by PCR NEGATIVE NEGATIVE Final    Comment:        The GeneXpert MRSA Assay (FDA approved for NASAL specimens only), is one component of a comprehensive MRSA colonization surveillance program. It is not intended to diagnose MRSA infection nor to guide or monitor treatment for MRSA infections.   Anaerobic culture     Status: None   Collection Time: 02/24/16 10:26 AM  Result Value Ref Range Status   Specimen Description WOUND SACRAL  Final   Special Requests SPEC A  Final   Culture NO ANAEROBES ISOLATED  Final   Report Status 02/29/2016 FINAL  Final  Aerobic Culture (superficial specimen)     Status: None   Collection Time: 02/24/16 10:26 AM  Result Value Ref Range Status   Specimen Description WOUND SACRAL  Final   Special Requests SPEC A  Final   Gram Stain   Final    ABUNDANT WBC PRESENT, PREDOMINANTLY PMN ABUNDANT GRAM POSITIVE COCCOBACILLUS    Culture   Final    MODERATE  ENTEROCOCCUS FAECALIS MODERATE GROUP B STREP(S.AGALACTIAE)ISOLATED TESTING AGAINST S. AGALACTIAE NOT ROUTINELY PERFORMED DUE TO PREDICTABILITY OF AMP/PEN/VAN SUSCEPTIBILITY.    Report Status 02/27/2016 FINAL  Final   Organism ID, Bacteria ENTEROCOCCUS FAECALIS  Final      Susceptibility   Enterococcus faecalis - MIC*    AMPICILLIN <=2 SENSITIVE Sensitive     VANCOMYCIN 2 SENSITIVE Sensitive     GENTAMICIN SYNERGY SENSITIVE Sensitive     * MODERATE ENTEROCOCCUS FAECALIS     Discharge Instructions:   Discharge Instructions  Call MD for:  extreme fatigue    Complete by:  As directed    Call MD for:  severe uncontrolled pain    Complete by:  As directed    Call MD for:  temperature >100.4    Complete by:  As directed    Diet - low sodium heart healthy    Complete by:  As directed    Diet Carb Modified    Complete by:  As directed    Home Health    Complete by:  As directed    To provide the following care/treatments:  RN   Wet to dry dressing changes BID to sacral wound   Increase activity slowly    Complete by:  As directed        Medication List    STOP taking these medications   amLODipine 5 MG tablet Commonly known as:  NORVASC   doxycycline 100 MG tablet Commonly known as:  VIBRA-TABS   insulin glargine 100 UNIT/ML injection Commonly known as:  LANTUS   metFORMIN 500 MG tablet Commonly known as:  GLUCOPHAGE   neomycin-bacitracin-polymyxin [id number] ointment   nutrition supplement (JUVEN) Pack   ondansetron 4 MG disintegrating tablet Commonly known as:  ZOFRAN ODT   sulfamethoxazole-trimethoprim 800-160 MG tablet Commonly known as:  BACTRIM DS,SEPTRA DS     TAKE these medications   acetaminophen 500 MG tablet Commonly known as:  TYLENOL Take 1,000 mg by mouth every 6 (six) hours as needed (for pain). What changed:  Another medication with the same name was removed. Continue taking this medication, and follow the directions you see here.     amoxicillin-clavulanate 875-125 MG tablet Commonly known as:  AUGMENTIN Take 1 tablet by mouth every 12 (twelve) hours.   HYDROcodone-acetaminophen 5-325 MG tablet Commonly known as:  NORCO/VICODIN Take 1 tablet by mouth every 6 (six) hours as needed.   insulin aspart protamine- aspart (70-30) 100 UNIT/ML injection Commonly known as:  NOVOLOG MIX 70/30 Inject 0.4 mLs (40 Units total) into the skin 2 (two) times daily with a meal.   Insulin Syringe 27G X 1/2" 1 ML Misc 1 application by Does not apply route 2 (two) times daily.   naproxen 500 MG tablet Commonly known as:  NAPROSYN Take 1 tablet (500 mg total) by mouth 2 (two) times daily.   oxyCODONE 5 MG immediate release tablet Commonly known as:  Oxy IR/ROXICODONE Take 1 tablet (5 mg total) by mouth every 6 (six) hours as needed for moderate pain or severe pain.            Durable Medical Equipment        Start     Ordered   02/27/16 [phone]  For home use only DME Walker rolling  Once    Question:  Patient needs a walker to treat with the following condition  Answer:  Sacral wound   02/27/16 0938     Follow-up Information    [place]. Schedule an appointment as soon as possible for a visit.   Why:  Appointment on Mar 07, 2016 at 2:30 pm  Contact information: [place] [address] [phone]       [facility]              Follow up.   Why:  Friday Mar 16, 2016 at 0800am arrive 5 to 10 minutes early  Contact information: [address]  [place] [phone]           Time coordinating discharge: Greater than 35 minutes.  Signed:  Klye Besecker  Pager [phone] [facility] 02/29/2016, 3:52 PM        '

## 2016-02-29 NOTE — Progress Notes (Signed)
[facility] Progress Note  5 Days Post-Op  Subjective: No changes or new complaints.  Objective: Vital signs in last 24 hours: Temp:  [97.8 F (36.6 C)-98.4 F (36.9 C)] 97.9 F (36.6 C) (12/13 0511) Pulse Rate:  [85-91] 87 (12/13 0511) Resp:  [18-20] 18 (12/13 0511) BP: (123-144)/(73-88) 124/81 (12/13 0511) SpO2:  [100 %] 100 % (12/13 0511) Last BM Date: 02/28/16  Intake/Output from previous day: 12/12 0701 - 12/13 0700 In: 1281.3 [P.O.:360; I.V.:671.3; IV Piggyback:250] Out: -  Intake/Output this shift: Total I/O In: 360 [P.O.:360] Out: -   PE: General appearance: alert, cooperative and no distress Card:  RRR, no M/G/R heard Abd: Soft, obese, NT/ND, +BS Skin: see picture below, still with signigicant slough and purulent material      Lab Results:   Recent Labs  02/27/16 0613 02/28/16 0547  WBC 15.4* 15.6*  HGB 9.9* 9.9*  HCT 28.7* 29.9*  PLT 273 300   BMET  Recent Labs  02/27/16 0613 02/28/16 0547 02/29/16 0513  NA 134* 135  --   K 3.7 3.9  --   CL 96* 98*  --   CO2 31 29  --   GLUCOSE 206* 186*  --   BUN 8 9  --   CREATININE 0.82 0.80 0.77  CALCIUM 8.2* 8.4*  --    PT/INR No results for input(s): LABPROT, INR in the last 72 hours. CMP     Component Value Date/Time   NA 135 02/28/2016 0547   K 3.9 02/28/2016 0547   CL 98 (L) 02/28/2016 0547   CO2 29 02/28/2016 0547   GLUCOSE 186 (H) 02/28/2016 0547   BUN 9 02/28/2016 0547   CREATININE 0.77 02/29/2016 0513   CALCIUM 8.4 (L) 02/28/2016 0547   PROT 7.3 02/16/2016 2000   ALBUMIN 3.5 02/16/2016 2000   AST 21 02/16/2016 2000   ALT 33 02/16/2016 2000   ALKPHOS 142 (H) 02/16/2016 2000   BILITOT 1.0 02/16/2016 2000   GFRNONAA >60 02/29/2016 0513   GFRAA >60 02/29/2016 0513   Lipase     Component Value Date/Time   LIPASE 16 02/16/2016 2000       Studies/Results: No results found.  Anti-infectives: Anti-infectives    Start     Dose/Rate Route Frequency Ordered  Stop   02/29/16 1000  amoxicillin-clavulanate (AUGMENTIN) 875-125 MG per tablet 1 tablet     1 tablet Oral Every 12 hours 02/29/16 0844     02/28/16 2100  vancomycin (VANCOCIN) 1,250 mg in sodium chloride 0.9 % 250 mL IVPB  Status:  Discontinued     1,250 mg 166.7 mL/hr over 90 Minutes Intravenous Every 12 hours 02/28/16 1315 02/29/16 0844   02/25/16 1000  vancomycin (VANCOCIN) IVPB 1000 mg/200 mL premix  Status:  Discontinued     1,000 mg 200 mL/hr over 60 Minutes Intravenous Every 12 hours 02/25/16 0830 02/28/16 1315   02/23/16 0600  piperacillin-tazobactam (ZOSYN) IVPB 3.375 g  Status:  Discontinued     3.375 g 12.5 mL/hr over 240 Minutes Intravenous Every 8 hours 02/22/16 2238 02/28/16 0852   02/23/16 0600  vancomycin (VANCOCIN) IVPB 1000 mg/200 mL premix  Status:  Discontinued     1,000 mg 200 mL/hr over 60 Minutes Intravenous Every 8 hours 02/22/16 2238 02/24/16 2302   02/22/16 2230  piperacillin-tazobactam (ZOSYN) IVPB 3.375 g     3.375 g 100 mL/hr over 30 Minutes Intravenous  Once 02/22/16 2227 02/22/16 2339   02/22/16 2230  vancomycin (VANCOCIN) IVPB  1000 mg/200 mL premix     1,000 mg 200 mL/hr over 60 Minutes Intravenous  Once 02/22/16 2227 02/23/16 0058   02/22/16 1730  clindamycin (CLEOCIN) IVPB 600 mg     600 mg 100 mL/hr over 30 Minutes Intravenous  Once 02/22/16 1716 02/22/16 1952   02/22/16 1530  ceFAZolin (ANCEF) IVPB 1 g/50 mL premix     1 g 100 mL/hr over 30 Minutes Intravenous  Once 02/22/16 1522 02/22/16 1615       Assessment/Plan  SACRAL WOUND S/p SACRAL WOUND DEBRIDEMENT, 02/24/16, Dr. Georganna Skeans AODM - poor control - DKA Acute kidney injury FEN: IV fluids/Carb Mod diet ID: day 7Vancomycin and Zosyn completed 6 days; d/ced 02/28/16 DVT: Lovenox  Plan:  Continue wet to dry, hydrotherapy, vancomycin.  Will refer to Wound center at discharge.    LOS: 7 days    Kalman Drape , [facility] 02/29/2016, 9:30 AM Pager:  [phone] Consults: [phone] Mon-Fri 7:00 am-4:30 pm Sat-Sun 7:00 am-11:30 am

## 2016-02-29 NOTE — Progress Notes (Addendum)
Discharge instructions gone over with patient. Prescriptions given. Home medications gone over. Case manager provided patient with match letter. Also patient has rolling walker to take home.Follow up appointments to be made. Diet and activity discussed. Signs and symptoms of infection gone over and reasons to call the doctor. [facility] providing RN services for dressing changes BID. Patient verbalized understanding of instructions.

## 2016-02-29 NOTE — Progress Notes (Signed)
Physical Therapy Wound Treatment Patient Details  Name: [name] [name] MRN: [id number] Date of Birth: Oct 12, 1985  Today's Date: 02/29/2016 Time: 0920-0949 Time Calculation (min): 29 min  Subjective  Subjective: "Is it getting better?" Patient and Family Stated Goals: Get wound better Date of Onset:  (3 weeks ago) Prior Treatments: surgical I&D  Pain Score:  Pt premedicated.   Wound Assessment  Wound / Incision (Open or Dehisced) 02/23/16 Non-pressure wound Sacrum Medial s/p I&D (Active)  Dressing Type Gauze (Comment);Moist to dry;ABD 02/29/2016 12:11 PM  Dressing Changed Changed 02/29/2016 12:11 PM  Dressing Status Clean;Dry;Intact 02/29/2016 12:11 PM  Dressing Change Frequency Daily 02/29/2016 12:11 PM  Site / Wound Assessment Yellow;Pink 02/29/2016 12:11 PM  % Wound base Red or Granulating 45% 02/29/2016 12:11 PM  % Wound base Yellow/Fibrinous Exudate 50% 02/29/2016 12:11 PM  % Wound base Black/Eschar 5% 02/29/2016 12:11 PM  % Wound base Other/Granulation Tissue (Comment) 0% 02/29/2016 12:11 PM  Peri-wound Assessment Induration 02/28/2016 10:43 AM  Wound Length (cm) 9.5 cm 02/27/2016  9:45 AM  Wound Width (cm) 5 cm 02/27/2016  9:45 AM  Wound Depth (cm) 5.5 cm 02/27/2016  9:45 AM  Margins Unattached edges (unapproximated) 02/29/2016 12:11 PM  Closure None 02/29/2016 12:11 PM  Drainage Amount Moderate 02/29/2016 12:11 PM  Drainage Description Purulent;No odor 02/29/2016 12:11 PM  Treatment Debridement (Selective);Hydrotherapy (Pulse lavage);Packing (Saline gauze) 02/29/2016 12:11 PM   Hydrotherapy Pulsed lavage therapy - wound location: sacrum Pulsed Lavage with Suction (psi): 12 psi Pulsed Lavage with Suction - Normal Saline Used: 1000 mL Pulsed Lavage Tip: Tip with splash shield Selective Debridement Selective Debridement - Location: sacrum Selective Debridement - Tools Used: Forceps;Scissors Selective Debridement - Tissue Removed: yellow slough   Wound Assessment and  Plan  Wound Therapy - Assess/Plan/Recommendations Wound Therapy - Clinical Statement: Pt will continue to benefit from hydrotherapy for removal of necrotic tissue and to decrease bioburden.  Wound Therapy - Functional Problem List: Decr sitting tolerance Factors Delaying/Impairing Wound Healing: Diabetes Mellitus;Infection - systemic/local Hydrotherapy Plan: Debridement;Dressing change;Patient/family education;Pulsatile lavage with suction Wound Therapy - Frequency: 6X / week Wound Therapy - Follow Up Recommendations: Home health RN Wound Plan: See above  Wound Therapy Goals- Improve the function of patient's integumentary system by progressing the wound(s) through the phases of wound healing (inflammation - proliferation - remodeling) by: Decrease Necrotic Tissue to: 20 Decrease Necrotic Tissue - Progress: Progressing toward goal Increase Granulation Tissue to: 80 Increase Granulation Tissue - Progress: Progressing toward goal Goals/treatment plan/discharge plan were made with and agreed upon by patient/family: Yes Time For Goal Achievement: 7 days Wound Therapy - Potential for Goals: Good  Goals will be updated until maximal potential achieved or discharge criteria met.  Discharge criteria: when goals achieved, discharge from hospital, MD decision/surgical intervention, no progress towards goals, refusal/missing three consecutive treatments without notification or medical reason.  GP     Thelma Comp 02/29/2016, 12:17 PM   Rolinda Roan, PT, DPT Acute Rehabilitation Services Pager: [phone]

## 2016-03-07 ENCOUNTER — Encounter: Payer: Self-pay | Admitting: Internal Medicine

## 2016-03-07 ENCOUNTER — Ambulatory Visit: Payer: Self-pay | Attending: Internal Medicine | Admitting: Internal Medicine

## 2016-03-07 DIAGNOSIS — L89109 Pressure ulcer of unspecified part of back, unspecified stage: Secondary | ICD-10-CM | POA: Insufficient documentation

## 2016-03-07 DIAGNOSIS — Z79899 Other long term (current) drug therapy: Secondary | ICD-10-CM | POA: Insufficient documentation

## 2016-03-07 DIAGNOSIS — J45909 Unspecified asthma, uncomplicated: Secondary | ICD-10-CM | POA: Insufficient documentation

## 2016-03-07 DIAGNOSIS — L03317 Cellulitis of buttock: Secondary | ICD-10-CM

## 2016-03-07 DIAGNOSIS — E118 Type 2 diabetes mellitus with unspecified complications: Secondary | ICD-10-CM

## 2016-03-07 DIAGNOSIS — E1165 Type 2 diabetes mellitus with hyperglycemia: Secondary | ICD-10-CM

## 2016-03-07 DIAGNOSIS — I10 Essential (primary) hypertension: Secondary | ICD-10-CM | POA: Insufficient documentation

## 2016-03-07 DIAGNOSIS — Z794 Long term (current) use of insulin: Secondary | ICD-10-CM

## 2016-03-07 DIAGNOSIS — IMO0002 Reserved for concepts with insufficient information to code with codable children: Secondary | ICD-10-CM

## 2016-03-07 DIAGNOSIS — E11622 Type 2 diabetes mellitus with other skin ulcer: Secondary | ICD-10-CM | POA: Insufficient documentation

## 2016-03-07 DIAGNOSIS — N179 Acute kidney failure, unspecified: Secondary | ICD-10-CM

## 2016-03-07 NOTE — Assessment & Plan Note (Signed)
Lab Results  Component Value Date   CREATININE 0.77 02/29/2016   resolved

## 2016-03-07 NOTE — Progress Notes (Signed)
Post hospital follow up Reviewed discharge notes and reviewed inpatient labs and imaging studies.   He had sepsis due to a large decubitus on back. He has f/u at wound clinic  DM- he has not been complaint with insulin or cbg monitoring. Lack of funds and general understanding have been barriers.  He generally feels ok. Wife has been changing dressings.  Past Medical History:  Diagnosis Date  . Asthma   . Diabetes mellitus without complication ([place])   . Hypertension     Social History   Social History  . Marital status: Married    Spouse name: N/A  . Number of children: N/A  . Years of education: N/A   Occupational History  . Not on file.   Social History Main Topics  . Smoking status: Never Smoker  . Smokeless tobacco: Never Used  . Alcohol use No  . Drug use:     Types: Marijuana  . Sexual activity: Yes    Birth control/ protection: Diaphragm   Other Topics Concern  . Not on file   Social History Narrative  . No narrative on file    Past Surgical History:  Procedure Laterality Date  . HERNIA REPAIR     x2 when he was a baby  . INCISION AND DRAINAGE ABSCESS Right 09/03/2015   Procedure: INCISION AND DRAINAGE ABSCESS;  Surgeon: Roseanne Kaufman, MD;  Location: [facility];  Service: Orthopedics;  Laterality: Right;  . IRRIGATION AND DEBRIDEMENT ABSCESS Left 08/31/2015   Procedure: IRRIGATION AND DEBRIDEMENT ABSCESS LEFT SHOULDER;  Surgeon: Alphonsa Overall, MD;  Location: [facility];  Service: General;  Laterality: Left;  . WOUND DEBRIDEMENT N/A 02/24/2016   Procedure: SACRAL WOUND DEBRIDEMENT;  Surgeon: Georganna Skeans, MD;  Location: [facility];  Service: General;  Laterality: N/A;    History reviewed. No pertinent family history.  No Known Allergies  Current Outpatient Prescriptions on File Prior to Visit  Medication Sig Dispense Refill  . acetaminophen (TYLENOL) 500 MG tablet Take 1,000 mg by mouth every 6 (six) hours as needed (for pain).    Marland Kitchen amoxicillin-clavulanate  (AUGMENTIN) 875-125 MG tablet Take 1 tablet by mouth every 12 (twelve) hours. 14 tablet 0  . HYDROcodone-acetaminophen (NORCO/VICODIN) 5-325 MG tablet Take 1 tablet by mouth every 6 (six) hours as needed. 10 tablet 0  . insulin aspart protamine- aspart (NOVOLOG MIX 70/30) (70-30) 100 UNIT/ML injection Inject 0.4 mLs (40 Units total) into the skin 2 (two) times daily with a meal. 10 mL 11  . Insulin Syringe 27G X 1/2" 1 ML MISC 1 application by Does not apply route 2 (two) times daily. 30 each 0  . naproxen (NAPROSYN) 500 MG tablet Take 1 tablet (500 mg total) by mouth 2 (two) times daily. 30 tablet 0  . oxyCODONE (OXY IR/ROXICODONE) 5 MG immediate release tablet Take 1 tablet (5 mg total) by mouth every 6 (six) hours as needed for moderate pain or severe pain. 15 tablet 0   No current facility-administered medications on file prior to visit.      patient denies chest pain, shortness of breath, orthopnea. Denies lower extremity edema, abdominal pain, change in appetite, change in bowel movements. Patient denies rashes, musculoskeletal complaints. No other specific complaints in a complete review of systems.   BP 114/74 (BP Location: Right Arm, Patient Position: Sitting, Cuff Size: Small)   Pulse 100   Temp 98.6 F (37 C)   Resp 18   Ht 5\' 7"  (1.702 m)   Wt 206 lb (  93.4 kg)   SpO2 99%   BMI 32.26 kg/m   well-developed well-nourished male in no acute distress. HEENT exam atraumatic, normocephalic, neck supple without jugular venous distention. Chest clear to auscultation cardiac exam S1-S2 are regular. Abdominal exam overweight with bowel sounds, soft and nontender. Extremities no edema. Neurologic exam is alert with a normal gait. Derm-large decubitus on back- clean, dry, packed.  Uncontrolled type 2 diabetes mellitus with complication [facility]) Lab Results  Component Value Date   HGBA1C 13.7 (H) 02/23/2016   He has had trouble with complaince for all sorts of reasons. I have spoken with  Arneta Cliche. She will see patient today and f/u with him Would advise to stick with 70/30 as he has now purchased it. He will need regular f/u.   Acute renal failure [facility]) Lab Results  Component Value Date   CREATININE 0.77 02/29/2016   resolved  Cellulitis Resolved He has large open wound- f/u wound clinic is scheduled

## 2016-03-07 NOTE — Assessment & Plan Note (Signed)
Resolved He has large open wound- f/u wound clinic is scheduled

## 2016-03-07 NOTE — Assessment & Plan Note (Signed)
Lab Results  Component Value Date   HGBA1C 13.7 (H) 02/23/2016   He has had trouble with complaince for all sorts of reasons. I have spoken with Arneta Cliche. She will see patient today and f/u with him Would advise to stick with 70/30 as he has now purchased it. He will need regular f/u.

## 2016-03-08 ENCOUNTER — Telehealth: Payer: Self-pay | Admitting: Internal Medicine

## 2016-03-08 MED ORDER — INSULIN ASPART PROT & ASPART (70-30 MIX) 100 UNIT/ML ~~LOC~~ SUSP: 40.0000 [IU] | Freq: Two times a day (BID) | SUBCUTANEOUS | 11 refills | Status: DC

## 2016-03-08 MED ORDER — "INSULIN SYRINGE 27G X 1/2"" 1 ML MISC": 1.0000 "application " | Freq: Two times a day (BID) | 3 refills | Status: DC

## 2016-03-08 NOTE — Telephone Encounter (Signed)
Amy from [facility] called to speak with nurse regarding patient's medication. Pt was prescribed insulin by another doc but wasn't given needle. Amy needs to know what can be done. Please follow up.    Thank you.

## 2016-03-08 NOTE — Telephone Encounter (Signed)
Yes, patient is supposed to be on insulin per Dr. Leanne Chang' note. Refilled insulin and syringes.

## 2016-03-08 NOTE — Telephone Encounter (Signed)
Amy with [facility] called to clarify if patient was taking insulin. He has been taking mother's Metformin. He has no insulin syringes. Does he need to take insulin and could you send script or RF. Carilyn Goodpasture, BSN, RN

## 2016-03-16 ENCOUNTER — Encounter (HOSPITAL_BASED_OUTPATIENT_CLINIC_OR_DEPARTMENT_OTHER): Payer: No Typology Code available for payment source | Attending: Internal Medicine

## 2016-03-27 ENCOUNTER — Encounter (HOSPITAL_COMMUNITY): Payer: Self-pay | Admitting: *Deleted

## 2016-03-27 ENCOUNTER — Emergency Department (HOSPITAL_COMMUNITY)
Admission: EM | Admit: 2016-03-27 | Discharge: 2016-03-27 | Disposition: A | Discharge status: Home / Self Care | Payer: Self-pay | Attending: Emergency Medicine | Admitting: Emergency Medicine

## 2016-03-27 DIAGNOSIS — R51 Headache: Secondary | ICD-10-CM | POA: Insufficient documentation

## 2016-03-27 DIAGNOSIS — R197 Diarrhea, unspecified: Secondary | ICD-10-CM | POA: Insufficient documentation

## 2016-03-27 DIAGNOSIS — Z5321 Procedure and treatment not carried out due to patient leaving prior to being seen by health care provider: Secondary | ICD-10-CM | POA: Insufficient documentation

## 2016-03-27 DIAGNOSIS — Z794 Long term (current) use of insulin: Secondary | ICD-10-CM | POA: Insufficient documentation

## 2016-03-27 DIAGNOSIS — J45909 Unspecified asthma, uncomplicated: Secondary | ICD-10-CM | POA: Insufficient documentation

## 2016-03-27 DIAGNOSIS — E119 Type 2 diabetes mellitus without complications: Secondary | ICD-10-CM | POA: Insufficient documentation

## 2016-03-27 DIAGNOSIS — I10 Essential (primary) hypertension: Secondary | ICD-10-CM | POA: Insufficient documentation

## 2016-03-27 LAB — CBC
HCT: 37.2 % — ABNORMAL LOW (ref 39.0–52.0)
Hemoglobin: 12.9 g/dL — ABNORMAL LOW (ref 13.0–17.0)
MCH: 29.4 pg (ref 26.0–34.0)
MCHC: 34.7 g/dL (ref 30.0–36.0)
MCV: 84.7 fL (ref 78.0–100.0)
PLATELETS: 298 10*3/uL (ref 150–400)
RBC: 4.39 MIL/uL (ref 4.22–5.81)
RDW: 13.4 % (ref 11.5–15.5)
WBC: 8.5 10*3/uL (ref 4.0–10.5)

## 2016-03-27 LAB — URINALYSIS, ROUTINE W REFLEX MICROSCOPIC
BILIRUBIN URINE: NEGATIVE
GLUCOSE, UA: 50 mg/dL — AB
Hgb urine dipstick: NEGATIVE
KETONES UR: NEGATIVE mg/dL
LEUKOCYTES UA: NEGATIVE
Nitrite: NEGATIVE
Protein, ur: NEGATIVE mg/dL
Specific Gravity, Urine: 1.014 (ref 1.005–1.030)
pH: 6 (ref 5.0–8.0)

## 2016-03-27 LAB — COMPREHENSIVE METABOLIC PANEL
ALT: 18 U/L (ref 17–63)
AST: 18 U/L (ref 15–41)
Albumin: 3.4 g/dL — ABNORMAL LOW (ref 3.5–5.0)
Alkaline Phosphatase: 96 U/L (ref 38–126)
Anion gap: 9 (ref 5–15)
BILIRUBIN TOTAL: 0.5 mg/dL (ref 0.3–1.2)
BUN: 10 mg/dL (ref 6–20)
CHLORIDE: 99 mmol/L — AB (ref 101–111)
CO2: 28 mmol/L (ref 22–32)
CREATININE: 0.71 mg/dL (ref 0.61–1.24)
Calcium: 9.5 mg/dL (ref 8.9–10.3)
GFR calc Af Amer: >60 mL/min (ref >60)
Glucose, Bld: 203 mg/dL — ABNORMAL HIGH (ref 65–99)
Potassium: 3.8 mmol/L (ref 3.5–5.1)
Sodium: 136 mmol/L (ref 135–145)
Total Protein: 7 g/dL (ref 6.5–8.1)

## 2016-03-27 LAB — LIPASE, BLOOD: Lipase: 17 U/L (ref 11–51)

## 2016-03-27 LAB — CBG MONITORING, ED: Glucose-Capillary: 210 mg/dL — ABNORMAL HIGH (ref 65–99)

## 2016-03-27 NOTE — ED Notes (Signed)
Name called for room placement - no answer x 2.

## 2016-03-27 NOTE — ED Triage Notes (Signed)
PT states he has been having intermittent diarrhea since discharged from the hospital in December from an abscess that popped, got infected, and had to have surgery.  PT came via ems with complaints of headache off and on since last nite-  Pt has been having pains in neck intermittently as well.

## 2016-04-10 ENCOUNTER — Encounter (HOSPITAL_COMMUNITY): Payer: Self-pay | Admitting: Emergency Medicine

## 2016-04-10 ENCOUNTER — Ambulatory Visit: Payer: Self-pay

## 2016-04-10 ENCOUNTER — Ambulatory Visit: Payer: Self-pay | Attending: Internal Medicine | Admitting: Internal Medicine

## 2016-04-10 ENCOUNTER — Inpatient Hospital Stay (HOSPITAL_COMMUNITY)
Admission: EM | Admit: 2016-04-10 | Discharge: 2016-04-17 | DRG: 853 | Disposition: A | Discharge status: Home Health | Payer: Self-pay | Attending: Internal Medicine | Admitting: Internal Medicine

## 2016-04-10 ENCOUNTER — Encounter: Payer: Self-pay | Admitting: Internal Medicine

## 2016-04-10 VITALS — BP 105/69 | HR 99 | Temp 98.6°F | Resp 16 | Wt 210.0 lb

## 2016-04-10 DIAGNOSIS — E876 Hypokalemia: Secondary | ICD-10-CM | POA: Diagnosis present

## 2016-04-10 DIAGNOSIS — A4102 Sepsis due to Methicillin resistant Staphylococcus aureus: Principal | ICD-10-CM | POA: Diagnosis present

## 2016-04-10 DIAGNOSIS — IMO0002 Reserved for concepts with insufficient information to code with codable children: Secondary | ICD-10-CM | POA: Diagnosis present

## 2016-04-10 DIAGNOSIS — L0231 Cutaneous abscess of buttock: Secondary | ICD-10-CM | POA: Diagnosis present

## 2016-04-10 DIAGNOSIS — Z794 Long term (current) use of insulin: Secondary | ICD-10-CM | POA: Insufficient documentation

## 2016-04-10 DIAGNOSIS — E1165 Type 2 diabetes mellitus with hyperglycemia: Secondary | ICD-10-CM | POA: Diagnosis present

## 2016-04-10 DIAGNOSIS — L89154 Pressure ulcer of sacral region, stage 4: Secondary | ICD-10-CM | POA: Insufficient documentation

## 2016-04-10 DIAGNOSIS — T148XXA Other injury of unspecified body region, initial encounter: Secondary | ICD-10-CM

## 2016-04-10 DIAGNOSIS — Z79891 Long term (current) use of opiate analgesic: Secondary | ICD-10-CM

## 2016-04-10 DIAGNOSIS — J45909 Unspecified asthma, uncomplicated: Secondary | ICD-10-CM | POA: Insufficient documentation

## 2016-04-10 DIAGNOSIS — L089 Local infection of the skin and subcutaneous tissue, unspecified: Secondary | ICD-10-CM

## 2016-04-10 DIAGNOSIS — Z9119 Patient's noncompliance with other medical treatment and regimen: Secondary | ICD-10-CM

## 2016-04-10 DIAGNOSIS — D649 Anemia, unspecified: Secondary | ICD-10-CM | POA: Diagnosis present

## 2016-04-10 DIAGNOSIS — Z23 Encounter for immunization: Secondary | ICD-10-CM

## 2016-04-10 DIAGNOSIS — E872 Acidosis: Secondary | ICD-10-CM | POA: Diagnosis present

## 2016-04-10 DIAGNOSIS — K651 Peritoneal abscess: Secondary | ICD-10-CM | POA: Diagnosis present

## 2016-04-10 DIAGNOSIS — Z9114 Patient's other noncompliance with medication regimen: Secondary | ICD-10-CM

## 2016-04-10 DIAGNOSIS — A419 Sepsis, unspecified organism: Secondary | ICD-10-CM | POA: Diagnosis present

## 2016-04-10 DIAGNOSIS — S31000A Unspecified open wound of lower back and pelvis without penetration into retroperitoneum, initial encounter: Secondary | ICD-10-CM

## 2016-04-10 DIAGNOSIS — E118 Type 2 diabetes mellitus with unspecified complications: Secondary | ICD-10-CM

## 2016-04-10 DIAGNOSIS — Z791 Long term (current) use of non-steroidal anti-inflammatories (NSAID): Secondary | ICD-10-CM

## 2016-04-10 DIAGNOSIS — L0291 Cutaneous abscess, unspecified: Secondary | ICD-10-CM

## 2016-04-10 DIAGNOSIS — I10 Essential (primary) hypertension: Secondary | ICD-10-CM | POA: Diagnosis present

## 2016-04-10 DIAGNOSIS — K6289 Other specified diseases of anus and rectum: Secondary | ICD-10-CM | POA: Diagnosis present

## 2016-04-10 DIAGNOSIS — Z79899 Other long term (current) drug therapy: Secondary | ICD-10-CM

## 2016-04-10 DIAGNOSIS — M4602 Spinal enthesopathy, cervical region: Secondary | ICD-10-CM | POA: Diagnosis present

## 2016-04-10 DIAGNOSIS — M542 Cervicalgia: Secondary | ICD-10-CM

## 2016-04-10 DIAGNOSIS — R739 Hyperglycemia, unspecified: Secondary | ICD-10-CM

## 2016-04-10 DIAGNOSIS — K611 Rectal abscess: Secondary | ICD-10-CM | POA: Diagnosis present

## 2016-04-10 DIAGNOSIS — Z833 Family history of diabetes mellitus: Secondary | ICD-10-CM

## 2016-04-10 LAB — CBC WITH DIFFERENTIAL/PLATELET
BASOS ABS: 0 {cells}/uL (ref 0–200)
Basophils Relative: 0 %
Eosinophils Absolute: 80 cells/uL (ref 15–500)
Eosinophils Relative: 1 %
HCT: 34 % — ABNORMAL LOW (ref 38.5–50.0)
HEMOGLOBIN: 11 g/dL — AB (ref 13.2–17.1)
LYMPHS ABS: 1360 {cells}/uL (ref 850–3900)
Lymphocytes Relative: 17 %
MCH: 27.8 pg (ref 27.0–33.0)
MCHC: 32.4 g/dL (ref 32.0–36.0)
MCV: 86.1 fL (ref 80.0–100.0)
MPV: 9.1 fL (ref 7.5–12.5)
Monocytes Absolute: 880 cells/uL (ref 200–950)
Monocytes Relative: 11 %
NEUTROS ABS: 5680 {cells}/uL (ref 1500–7800)
Neutrophils Relative %: 71 %
Platelets: 351 10*3/uL (ref 140–400)
RBC: 3.95 MIL/uL — AB (ref 4.20–5.80)
RDW: 14 % (ref 11.0–15.0)
WBC: 8 10*3/uL (ref 3.8–10.8)

## 2016-04-10 LAB — CBG MONITORING, ED: Glucose-Capillary: 374 mg/dL — ABNORMAL HIGH (ref 65–99)

## 2016-04-10 LAB — BASIC METABOLIC PANEL WITH GFR
BUN: 9 mg/dL (ref 7–25)
CHLORIDE: 98 mmol/L (ref 98–110)
CO2: 28 mmol/L (ref 20–31)
Calcium: 8.8 mg/dL (ref 8.6–10.3)
Creat: 0.73 mg/dL (ref 0.60–1.35)
GLUCOSE: 323 mg/dL — AB (ref 65–99)
POTASSIUM: 3.5 mmol/L (ref 3.5–5.3)
Sodium: 134 mmol/L — ABNORMAL LOW (ref 135–146)

## 2016-04-10 LAB — GLUCOSE, POCT (MANUAL RESULT ENTRY): POC Glucose: 253 mg/dl — AB (ref 70–99)

## 2016-04-10 MED ORDER — PIPERACILLIN-TAZOBACTAM 3.375 G IVPB 30 MIN
3.3750 g | Freq: Once | INTRAVENOUS | Status: AC
  Administered 2016-04-11: 3.375 g via INTRAVENOUS
  Filled 2016-04-10: qty 50

## 2016-04-10 MED ORDER — VANCOMYCIN HCL IN DEXTROSE 1-5 GM/200ML-% IV SOLN
1000.0000 mg | Freq: Once | INTRAVENOUS | Status: AC
  Administered 2016-04-11: 1000 mg via INTRAVENOUS
  Filled 2016-04-10: qty 200

## 2016-04-10 MED ORDER — SODIUM CHLORIDE 0.9 % IV BOLUS (SEPSIS)
1000.0000 mL | Freq: Once | INTRAVENOUS | Status: AC
  Administered 2016-04-11: 1000 mL via INTRAVENOUS

## 2016-04-10 MED ORDER — TRUEPLUS LANCETS 26G MISC: 1.0000 | Freq: Three times a day (TID) | 12 refills | Status: DC | PRN

## 2016-04-10 MED ORDER — TRUE METRIX GO GLUCOSE METER W/DEVICE KIT: 1.0000 | PACK | Freq: Three times a day (TID) | 0 refills | Status: DC | PRN

## 2016-04-10 MED ORDER — INSULIN ASPART PROT & ASPART (70-30 MIX) 100 UNIT/ML ~~LOC~~ SUSP: 40.0000 [IU] | Freq: Two times a day (BID) | SUBCUTANEOUS | 11 refills | Status: DC

## 2016-04-10 MED ORDER — "INSULIN SYRINGE 27G X 1/2"" 1 ML MISC": 1.0000 "application " | Freq: Two times a day (BID) | 3 refills | Status: DC

## 2016-04-10 MED ORDER — GLUCOSE BLOOD VI STRP: ORAL_STRIP | 12 refills | Status: DC

## 2016-04-10 MED FILL — TRUE METRIX BLOOD GLUCOSE M: W/DEVICE | 1 days supply | Qty: 1 | Fill #0

## 2016-04-10 MED FILL — TRUEplus LANCETS 28G MISC: 30 days supply | Qty: 100 | Fill #0

## 2016-04-10 MED FILL — TRUE METRIX TEST STRIP: 30 days supply | Qty: 100 | Fill #0

## 2016-04-10 NOTE — Patient Instructions (Addendum)
Financial aid. When go to Wound care Clinic, please remember to ask for supplies as well.  - 2 weeks appt / [facility] dm.  Pneumococcal Polysaccharide Vaccine: What You Need to Know 1. Why get vaccinated? Vaccination can protect older adults (and some children and younger adults) from pneumococcal disease. Pneumococcal disease is caused by bacteria that can spread from person to person through close contact. It can cause ear infections, and it can also lead to more serious infections of the:  Lungs (pneumonia),  Blood (bacteremia), and  Covering of the brain and spinal cord (meningitis). Meningitis can cause deafness and brain damage, and it can be fatal. Anyone can get pneumococcal disease, but children under 16 years of age, people with certain medical conditions, adults over 46 years of age, and cigarette smokers are at the highest risk. About 18,000 older adults die each year from pneumococcal disease in the [place]. Treatment of pneumococcal infections with penicillin and other drugs used to be more effective. But some strains of the disease have become resistant to these drugs. This makes prevention of the disease, through vaccination, even more important. 2. Pneumococcal polysaccharide vaccine (PPSV23) Pneumococcal polysaccharide vaccine (PPSV23) protects against 23 types of pneumococcal bacteria. It will not prevent all pneumococcal disease. PPSV23 is recommended for:  All adults 24 years of age and older,  Anyone 2 through 30 years of age with certain long-term health problems,  Anyone 2 through 30 years of age with a weakened immune system,  Adults 6 through 30 years of age who smoke cigarettes or have asthma. Most people need only one dose of PPSV. A second dose is recommended for certain high-risk groups. People 45 and older should get a dose even if they have gotten one or more doses of the vaccine before they turned 65. Your healthcare provider can give you  more information about these recommendations. Most healthy adults develop protection within 2 to 3 weeks of getting the shot. 3. Some people should not get this vaccine  Anyone who has had a life-threatening allergic reaction to PPSV should not get another dose.  Anyone who has a severe allergy to any component of PPSV should not receive it. Tell your provider if you have any severe allergies.  Anyone who is moderately or severely ill when the shot is scheduled may be asked to wait until they recover before getting the vaccine. Someone with a mild illness can usually be vaccinated.  Children less than 94 years of age should not receive this vaccine.  There is no evidence that PPSV is harmful to either a pregnant woman or to her fetus. However, as a precaution, women who need the vaccine should be vaccinated before becoming pregnant, if possible. 4. Risks of a vaccine reaction With any medicine, including vaccines, there is a chance of side effects. These are usually mild and go away on their own, but serious reactions are also possible. About half of people who get PPSV have mild side effects, such as redness or pain where the shot is given, which go away within about two days. Less than 1 out of 100 people develop a fever, muscle aches, or more severe local reactions. Problems that could happen after any vaccine:  People sometimes faint after a medical procedure, including vaccination. Sitting or lying down for about 15 minutes can help prevent fainting, and injuries caused by a fall. Tell your doctor if you feel dizzy, or have vision changes or ringing in the ears.  Some people get severe pain in the shoulder and have difficulty moving the arm where a shot was given. This happens very rarely.  Any medication can cause a severe allergic reaction. Such reactions from a vaccine are very rare, estimated at about 1 in a million doses, and would happen within a few minutes to a few hours after the  vaccination. As with any medicine, there is a very remote chance of a vaccine causing a serious injury or death. The safety of vaccines is always being monitored. For more information, visit: http://www.aguilar.org/ 5. What if there is a serious reaction? What should I look for? Look for anything that concerns you, such as signs of a severe allergic reaction, very high fever, or unusual behavior. Signs of a severe allergic reaction can include hives, swelling of the face and throat, difficulty breathing, a fast heartbeat, dizziness, and weakness. These would usually start a few minutes to a few hours after the vaccination. What should I do? If you think it is a severe allergic reaction or other emergency that can't wait, call 9-1-1 or get to the nearest hospital. Otherwise, call your doctor. Afterward, the reaction should be reported to the Vaccine Adverse Event Reporting System (VAERS). Your doctor might file this report, or you can do it yourself through the VAERS web site at www.vaers.SamedayNews.es, or by calling 857-601-3063. VAERS does not give medical advice. 6. How can I learn more?  Ask your doctor. He or she can give you the vaccine package insert or suggest other sources of information.  Call your local or state health department.  Contact the Centers for Disease Control and Prevention (CDC):  Call (812)725-6544 (1-800-CDC-INFO) or  Visit CDC's website at http://hunter.com/ CDC Pneumococcal Polysaccharide Vaccine VIS (07/10/13) This information is not intended to replace advice given to you by your health care provider. Make sure you discuss any questions you have with your health care provider. Document Released: 12/31/2005 Document Revised: 11/24/2015 Document Reviewed: 11/24/2015 Elsevier Interactive Patient Education  2017 [place].   -   How to Change Your Dressing  - change dressing daily. Introduction A dressing is a material that is placed in and over wounds.  A dressing helps your wound to heal by protecting it from:  Bacteria.  Worse injury.  Being too dry or too wet. What are the risks? The sticky (adhesive) tape that is used with a dressing may make your skin sore or irritated, or it may cause a rash. These are the most common problems. However, more serious problems can develop, such as:  Bleeding.  Infection. How to change your dressing Getting Ready to Change Your Dressing   Take a shower before you do the first dressing change of the day. If your doctor does not want your wound to get wet and your dressing is not waterproof, you may need to put plastic leak-proof sealing wrap on your dressing to protect it.  If needed, take pain medicine as told by your doctor 30 minutes before you change your dressing.  Set up a clean station for wound care. You will need:  A plastic trash bag that is open and ready to use.  Hand sanitizer.  Wound cleanser or salt-water solution (saline) as told by your doctor.  New dressing material or bandages. Make sure to open the dressing package so the dressing stays on the inside of the package. You may also need these supplies in your clean station:  A box of vinyl gloves.  Tape.  Skin protectant. This may be a wipe, film, or spray.  Clean or germ-free (sterile) scissors.  A cotton-tipped applicator. Taking Off Your Old Dressing  Wash your hands with soap and water. Dry your hands with a clean towel. If you cannot use soap and water, use hand sanitizer.  If you are using gloves, put on the gloves before you take off the dressing.  Gently take off any adhesive or tape by pulling it off in the direction of your hair growth. Only touch the outside edges of the dressing.  Take off the dressing. If the dressing sticks to your skin, wet the dressing with a germ-free salt-water solution. This helps it come off more easily.  Take off any gauze or packing in your wound.  Throw the old dressing  supplies into the ready trash bag.  Take off your gloves. To take off each glove, grab the cuff with your other hand and turn the glove inside out. Put the gloves in the trash right away.  Wash your hands with soap and water. Dry your hands with a clean towel. If you cannot use soap and water, use hand sanitizer. Cleaning Your Wound  Follow instructions from your doctor about how to clean your wound. This may include using a salt-water solution or recommended wound cleanser.  Do not use over-the-counter medicated or antiseptic creams, sprays, liquids, or dressings unless your doctor tells you to do that.  Use a clean gauze pad to clean the area fully with the salt-water solution or wound cleanser that your doctor recommends.  Throw the gauze pad into the trash bag.  Wash your hands with soap and water. Dry your hands with a clean towel. If you cannot use soap and water, use hand sanitizer. Putting on the Dressing  If your doctor recommended a skin protectant, put it on the skin around the wound.  Cover the wound with the recommended dressing, such as a nonstick gauze or bandage. Make sure to touch only the outside edges of the dressing. Do not touch the inside of the dressing.  Attach the dressing so all sides stay in place. You may do this with the attached medical adhesive, roll gauze, or tape. If you use tape, do not wrap the tape all the way around your arm or leg.  Take off your gloves. Put them in the trash bag with the old dressing. Tie the bag shut and throw it away.  Wash your hands with soap and water. Dry your hands with a clean towel. If you cannot use soap and water, use hand sanitizer. Get help if:   You have new pain.  You have irritation, a rash, or itching around the wound or dressing.  Changing your dressing is painful.  Changing your dressing causes a lot of bleeding. Get help right away if:  You have very bad pain.  You have signs of infection, such  as:  More redness, swelling, or pain.  More fluid or blood.  Warmth.  Pus or a bad smell.  Red streaks leading from wound.  A fever. This information is not intended to replace advice given to you by your health care provider. Make sure you discuss any questions you have with your health care provider. Document Released: 06/01/2008 Document Revised: 08/11/2015 Document Reviewed: 12/09/2014  2017 Elsevier  -  Diabetes Mellitus and Food It is important for you to manage your blood sugar (glucose) level. Your blood glucose level can be greatly affected by what you eat.  Eating healthier foods in the appropriate amounts throughout the day at about the same time each day will help you control your blood glucose level. It can also help slow or prevent worsening of your diabetes mellitus. Healthy eating may even help you improve the level of your blood pressure and reach or maintain a healthy weight. General recommendations for healthful eating and cooking habits include:  Eating meals and snacks regularly. Avoid going long periods of time without eating to lose weight.  Eating a diet that consists mainly of plant-based foods, such as fruits, vegetables, nuts, legumes, and whole grains.  Using low-heat cooking methods, such as baking, instead of high-heat cooking methods, such as deep frying. Work with your dietitian to make sure you understand how to use the Nutrition Facts information on food labels. How can food affect me? Carbohydrates  Carbohydrates affect your blood glucose level more than any other type of food. Your dietitian will help you determine how many carbohydrates to eat at each meal and teach you how to count carbohydrates. Counting carbohydrates is important to keep your blood glucose at a healthy level, especially if you are using insulin or taking certain medicines for diabetes mellitus. Alcohol  Alcohol can cause sudden decreases in blood glucose (hypoglycemia),  especially if you use insulin or take certain medicines for diabetes mellitus. Hypoglycemia can be a life-threatening condition. Symptoms of hypoglycemia (sleepiness, dizziness, and disorientation) are similar to symptoms of having too much alcohol. If your health care provider has given you approval to drink alcohol, do so in moderation and use the following guidelines:  Women should not have more than one drink per day, and men should not have more than two drinks per day. One drink is equal to:  12 oz of beer.  5 oz of wine.  1 oz of hard liquor.  Do not drink on an empty stomach.  Keep yourself hydrated. Have water, diet soda, or unsweetened iced tea.  Regular soda, juice, and other mixers might contain a lot of carbohydrates and should be counted. What foods are not recommended? As you make food choices, it is important to remember that all foods are not the same. Some foods have fewer nutrients per serving than other foods, even though they might have the same number of calories or carbohydrates. It is difficult to get your body what it needs when you eat foods with fewer nutrients. Examples of foods that you should avoid that are high in calories and carbohydrates but low in nutrients include:  Trans fats (most processed foods list trans fats on the Nutrition Facts label).  Regular soda.  Juice.  Candy.  Sweets, such as cake, pie, doughnuts, and cookies.  Fried foods. What foods can I eat? Eat nutrient-rich foods, which will nourish your body and keep you healthy. The food you should eat also will depend on several factors, including:  The calories you need.  The medicines you take.  Your weight.  Your blood glucose level.  Your blood pressure level.  Your cholesterol level. You should eat a variety of foods, including:  Protein.  Lean cuts of meat.  Proteins low in saturated fats, such as fish, egg whites, and beans. Avoid processed meats.  Fruits and  vegetables.  Fruits and vegetables that may help control blood glucose levels, such as apples, mangoes, and yams.  Dairy products.  Choose fat-free or low-fat dairy products, such as milk, yogurt, and cheese.  Grains, bread, pasta, and rice.  Choose whole grain  products, such as multigrain bread, whole oats, and brown rice. These foods may help control blood pressure.  Fats.  Foods containing healthful fats, such as nuts, avocado, olive oil, canola oil, and fish. Does everyone with diabetes mellitus have the same meal plan? Because every person with diabetes mellitus is different, there is not one meal plan that works for everyone. It is very important that you meet with a dietitian who will help you create a meal plan that is just right for you. This information is not intended to replace advice given to you by your health care provider. Make sure you discuss any questions you have with your health care provider. Document Released: 11/30/2004 Document Revised: 08/11/2015 Document Reviewed: 01/30/2013 Elsevier Interactive Patient Education  2017 [place] for Eating Away From Home If You Have Diabetes Controlling your level of blood glucose, also known as blood sugar, can be challenging. It can be even more difficult when you do not prepare your own meals. The following tips can help you manage your diabetes when you eat away from home. Planning ahead Plan ahead if you know you will be eating away from home:  Ask your health care provider how to time meals and medicine if you are taking insulin.  Make a list of restaurants near you that offer healthy choices. If they have a carry-out menu, take it home and plan what you will order ahead of time.  Look up the restaurant you want to eat at online. Many chain and fast-food restaurants list nutritional information online. Use this information to choose the healthiest options and to calculate how many carbohydrates will be in  your meal.  Use a carbohydrate-counting book or mobile app to look up the carbohydrate content and serving size of the foods you want to eat.  Become familiar with serving sizes and learn to recognize how many servings are in a portion. This will allow you to estimate how many carbohydrates you can eat. Free foods A "free food" is any food or drink that has less than 5 g of carbohydrates per serving. Free foods include:  Many vegetables.  Hard boiled eggs.  Nuts or seeds.  Olives.  Cheeses.  Meats. These types of foods make good appetizer choices and are often available at salad bars. Lemon juice, vinegar, or a low-calorie salad dressing of fewer than 20 calories per serving can be used as a "free" salad dressing. Choices to reduce carbohydrates  Substitute nonfat sweetened yogurt with a sugar-free yogurt. Yogurt made from soy milk may also be used, but you will still want a sugar-free or plain option to choose a lower carbohydrate amount.  Ask your server to take away the bread basket or chips from your table.  Order fresh fruit. A salad bar often offers fresh fruit choices. Avoid canned fruit because it is usually packed in sugar or syrup.  Order a salad, and eat it without dressing. Or, create a "free" salad dressing.  Ask for substitutions. For example, instead of [place] fries, request an order of a vegetable such as salad, green beans, or broccoli. Other tips  If you take insulin, take the insulin once your food arrives to your table. This will ensure your insulin and food are timed correctly.  Ask your server about the portion size before your order, and ask for a take-out box if the portion has more servings than you should have. When your food comes, leave the amount you should have on  the plate, and put the rest in the take-out box.  Consider splitting an entree with someone and ordering a side salad. This information is not intended to replace advice given to you by  your health care provider. Make sure you discuss any questions you have with your health care provider. Document Released: 03/05/2005 Document Revised: 08/11/2015 Document Reviewed: 06/02/2013 Elsevier Interactive Patient Education  2017 [employer].

## 2016-04-10 NOTE — Progress Notes (Signed)
[name] [name], is a 31 y.o. male  WUJ:[id number]  NFA:[id number]  DOB - [birth year]  CC:  Chief Complaint  Patient presents with  . Establish Care  . Diabetes       HPI: [name] [name] is a 31 y.o. male here today to establish medical care.  DMtype 2 uncontrolled with complications, HTN, MRSA skin infection, nonadherence to his medications, who was admitted 02/22/16 - 02/29/16 with a sacral skin infection and sepsis. Of note, surgical team eval pt on 02/24/16 and debrieded the wound. Pt was discharged home on abx, for which he is finished. He has been trying to due wound dressings daily, but running out of supplies. The [place] rn that was provided upon hosp discharge has stopped coming due to no insurance.  He and wife notes that there is more foul odor coming out of wound lately, and they have been doing best they can w/ limited supplies.  He was seen in our clinic 03/07/16 post hospitalization. Has not seen Wound Clinic yet, has appt 04/18/16.  He has long standing diabetes, was on nph 70/30 40bid, but only doing 40 qd due to limited supply of insulin.    Patient has No headache, No chest pain, No abdominal pain - No Nausea, No new weakness tingling or numbness, No Cough - SOB.  Does not smoke/drink etoh.  His wife is with him in room, but she is on phone most of time I am in room.  Review of Systems: Per hpi, o/w all systems reviewed and neg.   No Known Allergies Past Medical History:  Diagnosis Date  . Asthma   . Diabetes mellitus without complication ([place])   . Hypertension    Current Outpatient Prescriptions on File Prior to Visit  Medication Sig Dispense Refill  . acetaminophen (TYLENOL) 500 MG tablet Take 1,000 mg by mouth every 6 (six) hours as needed (for pain).    Marland Kitchen HYDROcodone-acetaminophen (NORCO/VICODIN) 5-325 MG tablet Take 1 tablet by mouth every 6 (six) hours as needed. (Patient not taking: Reported on 04/10/2016) 10 tablet 0  . naproxen (NAPROSYN) 500 MG tablet Take 1  tablet (500 mg total) by mouth 2 (two) times daily. (Patient not taking: Reported on 04/10/2016) 30 tablet 0  . oxyCODONE (OXY IR/ROXICODONE) 5 MG immediate release tablet Take 1 tablet (5 mg total) by mouth every 6 (six) hours as needed for moderate pain or severe pain. (Patient not taking: Reported on 04/10/2016) 15 tablet 0   No current facility-administered medications on file prior to visit.    No family history on file. Social History   Social History  . Marital status: Married    Spouse name: N/A  . Number of children: N/A  . Years of education: N/A   Occupational History  . Not on file.   Social History Main Topics  . Smoking status: Never Smoker  . Smokeless tobacco: Never Used  . Alcohol use No  . Drug use: Yes    Types: Marijuana  . Sexual activity: Yes    Birth control/ protection: Diaphragm   Other Topics Concern  . Not on file   Social History Narrative  . No narrative on file    Objective:   Vitals:   04/10/16 1005  BP: 105/69  Pulse: 99  Resp: 16  Temp: 98.6 F (37 C)    Filed Weights   04/10/16 1005  Weight: 210 lb (95.3 kg)    BP Readings from Last 3 Encounters:  04/10/16 105/69  03/27/16  120/76  03/07/16 114/74    Physical Exam: Constitutional: Patient appears well-developed and well-nourished. No distress. AAOx3, obese, pleasant. HENT: Normocephalic, atraumatic, External right and left ear normal. Oropharynx is clear and moist.  Eyes: Conjunctivae and EOM are normal. PERRL, no scleral icterus. Neck: Normal ROM. Neck supple. No JVD.  CVS: RRR, S1/S2 +, no murmurs, no gallops, no carotid bruit.  Pulmonary: Effort and breath sounds normal, no stridor, rhonchi, wheezes, rales.  Abdominal: Soft. BS +, no distension, tenderness, rebound or guarding.  Musculoskeletal: Normal range of motion. No edema and no tenderness.  Sacral wound is deep, w/ some granulation tissue noted, but serous drainage, foul odor noted as well.  Compared to pictures  on 02/28/16 PA Earnstine Regal notes, wound actually looks  A lot better.  LE: bilat/ no c/c/e, pulses 2+ bilateral. Neuro: Alert. Normal reflexes, muscle tone coordination wnl. No cranial nerve deficit grossly. Skin: Skin is warm and dry. No rash noted. Not diaphoretic. No erythema. No pallor. Psychiatric: Normal mood and affect. Behavior, judgment, thought content normal.  Lab Results  Component Value Date   WBC 8.5 03/27/2016   HGB 12.9 (L) 03/27/2016   HCT 37.2 (L) 03/27/2016   MCV 84.7 03/27/2016   PLT 298 03/27/2016   Lab Results  Component Value Date   CREATININE 0.71 03/27/2016   BUN 10 03/27/2016   NA 136 03/27/2016   K 3.8 03/27/2016   CL 99 (L) 03/27/2016   CO2 28 03/27/2016    Lab Results  Component Value Date   HGBA1C 13.7 (H) 02/23/2016   Lipid Panel     Component Value Date/Time   CHOL 123 02/28/2016 0547   TRIG 150 (H) 02/28/2016 0547   HDL 25 (L) 02/28/2016 0547   CHOLHDL 4.9 02/28/2016 0547   VLDL 30 02/28/2016 0547   LDLCALC 68 02/28/2016 0547        Depression screen PHQ 2/9 04/10/2016 03/07/2016  Decreased Interest 2 1  Down, Depressed, Hopeless 2 1  PHQ - 2 Score 4 2  Altered sleeping 3 2  Tired, decreased energy 3 3  Change in appetite 2 1  Feeling bad or failure about yourself  2 1  Trouble concentrating 1 1  Moving slowly or fidgety/restless 1 0  Suicidal thoughts 1 0  PHQ-9 Score 17 10       Assessment and plan:   1. Uncontrolled type 2 diabetes mellitus with complication, with long-term current use of insulin ([place]) - rx nph 70/30 40u bid, gave him enough to last this time - POCT glucose (manual entry) - low carb diet recd - new glucometer and supplies rx. - fu w/ [facility] for dm check in 2 wks.  2. Decubitus ulcer of sacral region, stage 4 ([place]) Sp debriedment 02/24/16 by Dr Georganna Skeans 02/24/16, recd wet to dry dressing, hydrotherapy. - would redressed today, appreciate Carilyn Goodpasture rn assistance. - supplies  provided for at least 1wk until can f/u w/ Wound Clinic - urged pt to keep wound clinic 04/18/16 and ask for more supplies at that time. - BASIC METABOLIC PANEL WITH GFR - CBC with Differential  3. Uninsured -unfortunately, unable to get him  hhc until gets financial assistance -has appt for financial aid today. - once gets, will need referral back to surgery.   Return in about 2 months (around 06/08/2016), or if symptoms worsen or fail to improve.  The patient was given clear instructions to go to ER or return to medical center if symptoms  don't improve, worsen or new problems develop. The patient verbalized understanding. The patient was told to call to get lab results if they haven't heard anything in the next week.    This note has been created with Surveyor, quantity. Any transcriptional errors are unintentional.   Maren Reamer, MD, [place], [place]   04/10/2016, 1:07 PM

## 2016-04-10 NOTE — ED Provider Notes (Signed)
[facility] DEPT Provider Note   CSN: [id number] Arrival date & time: 04/10/16  2245  By signing my name below, I, [name], attest that this documentation has been prepared under the direction and in the presence of Ivor Costa, MD. Electronically signed, [name], ED Scribe. 04/10/16. 11:49 PM.  History   Chief Complaint Chief Complaint  Patient presents with  . Wound Check   HPI HPI Comments: [name] [name] is a 31 y.o. male, with Hx of uncontrolled DM, sepsis, HTN, brought in by ambulance, who presents to the Emergency Department complaining of worsened, 7/10 pain s/p sacral abscess removal that was performed on 02/24/2016. He was hospitalized for sepsis on 02/22/2016, seven days after originally being seen in the ED for the abscess. Pt had the sacral wound debrided by Dr. Grandville Silos with no immediate complications. He was discharged home after the procedure with abx which he finished. He has not been seen at a wound clinic. Pt states that the wound started as a boil. Now pt notes a new lump near the area where the original abscess was. He is unsure how long the bump has been present. He was seen at [facility] for a follow up with Dr. Janne Napoleon. He was discharged with instructions to return for another follow up in two months. He states that Dr. Janne Napoleon did not express any concern for the current bump. He presents to the ED this evening due to persistent, worsening pain in the area. Pt notes bloody and purulent drainage from the area today. He denies any known fever at home but pt's temperature is 100.71F here in the ED. His wife notes that the pt's CBG was elevated this evening, 374 taken here tonight.   The history is provided by the patient. No language interpreter was used.   Past Medical History:  Diagnosis Date  . Asthma   . Diabetes mellitus without complication ([place])   . Hypertension     Patient Active Problem List   Diagnosis Date Noted  . Wound  infection 04/11/2016  . Hyperglycemia   . Sepsis due to Streptococcus agalactiae ([place])   . Enterococcus faecalis infection   . Uncontrolled type 2 diabetes mellitus with complication ([place])   . Acute renal failure ([place])   . Hypotension   . Sepsis, unspecified organism ([place]) 02/22/2016  . DKA (diabetic ketoacidoses) ([place]) 02/22/2016  . AKI (acute kidney injury) ([place]) 09/09/2015  . Cellulitis   . Abscess of right shoulder 09/06/2015  . Normocytic anemia 09/06/2015  . Obesity, morbid ([place]) 09/06/2015  . Hypertension 09/06/2015  . Recurrent boils 09/06/2015  . MRSA (methicillin resistant staph aureus) culture positive 09/04/2015  . Abscess of left shoulder 08/31/2015  . Uncontrolled diabetes mellitus ([place]) 08/31/2015  . Abscess of right arm 08/31/2015    Past Surgical History:  Procedure Laterality Date  . HERNIA REPAIR     x2 when he was a baby  . INCISION AND DRAINAGE ABSCESS Right 09/03/2015   Procedure: INCISION AND DRAINAGE ABSCESS;  Surgeon: Roseanne Kaufman, MD;  Location: [facility];  Service: Orthopedics;  Laterality: Right;  . IRRIGATION AND DEBRIDEMENT ABSCESS Left 08/31/2015   Procedure: IRRIGATION AND DEBRIDEMENT ABSCESS LEFT SHOULDER;  Surgeon: Alphonsa Overall, MD;  Location: [facility];  Service: General;  Laterality: Left;  . WOUND DEBRIDEMENT N/A 02/24/2016   Procedure: SACRAL WOUND DEBRIDEMENT;  Surgeon: Georganna Skeans, MD;  Location: [facility];  Service: General;  Laterality: N/A;     Home Medications    Prior  to Admission medications   Medication Sig Start Date End Date Taking? Authorizing Provider  acetaminophen (TYLENOL) 500 MG tablet Take 1,000 mg by mouth every 6 (six) hours as needed (for pain).   Yes Historical Provider, MD  insulin aspart protamine- aspart (NOVOLOG MIX 70/30) (70-30) 100 UNIT/ML injection Inject 0.4 mLs (40 Units total) into the skin 2 (two) times daily with a meal. 04/10/16  Yes Maren Reamer, MD  Blood Glucose Monitoring Suppl (TRUE METRIX GO GLUCOSE  METER) w/Device KIT 1 each by Does not apply route every 8 (eight) hours as needed. 04/10/16   Maren Reamer, MD  glucose blood test strip Use as instructed 04/10/16   Maren Reamer, MD  HYDROcodone-acetaminophen (NORCO/VICODIN) 5-325 MG tablet Take 1 tablet by mouth every 6 (six) hours as needed. Patient not taking: Reported on 04/10/2016 02/16/16   Shawn C Joy, PA-C  Insulin Syringe 27G X 1/2" 1 ML MISC 1 application by Does not apply route 2 (two) times daily. 04/10/16   Maren Reamer, MD  naproxen (NAPROSYN) 500 MG tablet Take 1 tablet (500 mg total) by mouth 2 (two) times daily. Patient not taking: Reported on 04/10/2016 02/16/16   Shawn C Joy, PA-C  oxyCODONE (OXY IR/ROXICODONE) 5 MG immediate release tablet Take 1 tablet (5 mg total) by mouth every 6 (six) hours as needed for moderate pain or severe pain. Patient not taking: Reported on 04/10/2016 02/29/16   Venetia Maxon Rama, MD  TRUEPLUS LANCETS 26G MISC 1 each by Does not apply route every 8 (eight) hours as needed. 04/10/16   Maren Reamer, MD   Family History Family History  Problem Relation Age of Onset  . Diabetes type II Mother   . Diabetes type II Father     Social History Social History  Substance Use Topics  . Smoking status: Never Smoker  . Smokeless tobacco: Never Used  . Alcohol use No    Allergies   Patient has no known allergies.   Review of Systems Review of Systems  Constitutional: Positive for fever (100.3).  Respiratory: Negative for shortness of breath.   Cardiovascular: Negative for chest pain.  Gastrointestinal: Negative for abdominal pain, nausea and vomiting.  Skin: Positive for wound (sacral).  All other systems reviewed and are negative.   Physical Exam Updated Vital Signs BP 131/77 (BP Location: Right Arm)   Pulse 114   Temp 100.3 F (37.9 C) (Oral)   Resp 15   Ht '5\' 7"'  (1.702 m)   Wt 210 lb (95.3 kg)   SpO2 96%   BMI 32.89 kg/m   Physical Exam  Constitutional: He is  oriented to person, place, and time. He appears well-developed and well-nourished. No distress.  HENT:  Head: Normocephalic and atraumatic.  Cardiovascular: Regular rhythm and normal heart sounds.   No murmur heard. Tachycardia  Pulmonary/Chest: Effort normal and breath sounds normal. No respiratory distress. He has no wheezes.  Abdominal: Soft. There is no tenderness.  Musculoskeletal: He exhibits no edema.  Deep midline sacral wound with moderate purulent drainage, no adjacent erythema, laterally over the right buttock there is mild erythema as well as fluctuance and crepitus  Neurological: He is alert and oriented to person, place, and time.  Skin: Skin is warm and dry.  See above  Psychiatric: He has a normal mood and affect.  Nursing note and vitals reviewed.    ED Treatments / Results  DIAGNOSTIC STUDIES: Oxygen Saturation is 98% on RA, normal by my interpretation.  COORDINATION OF CARE: 11:47 AM-Discussed treatment plan with pt at bedside and pt agreed to plan.   Labs (all labs ordered are listed, but only abnormal results are displayed) Labs Reviewed  COMPREHENSIVE METABOLIC PANEL - Abnormal; Notable for the following:       Result Value   Sodium 133 (*)    Chloride 98 (*)    Glucose, Bld 418 (*)    Calcium 8.8 (*)    Total Protein 6.4 (*)    Albumin 3.1 (*)    ALT 10 (*)    All other components within normal limits  CBC WITH DIFFERENTIAL/PLATELET - Abnormal; Notable for the following:    RBC 3.57 (*)    Hemoglobin 10.1 (*)    HCT 29.3 (*)    Monocytes Absolute 1.6 (*)    All other components within normal limits  CBG MONITORING, ED - Abnormal; Notable for the following:    Glucose-Capillary 374 (*)    All other components within normal limits  I-STAT CG4 LACTIC ACID, ED - Abnormal; Notable for the following:    Lactic Acid, Venous 2.75 (*)    All other components within normal limits  I-STAT CHEM 8, ED - Abnormal; Notable for the following:    Chloride 95  (*)    Glucose, Bld 430 (*)    Calcium, Ion 1.13 (*)    Hemoglobin 9.9 (*)    HCT 29.0 (*)    All other components within normal limits  CULTURE, BLOOD (ROUTINE X 2)  CULTURE, BLOOD (ROUTINE X 2)  URINE CULTURE  URINALYSIS, ROUTINE W REFLEX MICROSCOPIC  SEDIMENTATION RATE  C-REACTIVE PROTEIN  LACTIC ACID, PLASMA  LACTIC ACID, PLASMA  PROCALCITONIN  PROTIME-INR  APTT  BASIC METABOLIC PANEL  CBC   EKG  EKG Interpretation None       Radiology Ct Pelvis W Contrast  Result Date: 04/11/2016 CLINICAL DATA:  Bloody drainage from site of prior surgery for sacral abscess. Initial encounter. EXAM: CT PELVIS WITH CONTRAST TECHNIQUE: Multidetector CT imaging of the pelvis was performed using the standard protocol following the bolus administration of intravenous contrast. CONTRAST:  100 mL of Isovue 300 IV contrast COMPARISON:  CT of the pelvis performed 02/22/2016 FINDINGS: Urinary Tract: The bladder is mildly distended and grossly unremarkable. Bowel: Visualized small and large bowel loops are grossly unremarkable in appearance. Vascular/Lymphatic: The visualized vasculature is grossly unremarkable in appearance. No retroperitoneal or pelvic sidewall lymphadenopathy is seen. No inguinal lymphadenopathy is appreciated. Reproductive: The prostate remains normal in size. The scrotum is unremarkable in appearance. Other: There is a surgical defect extending nearly to the level of the coccyx, with air again noted tracking superiorly from this location just superficial to the paraspinal musculature, and new associated soft tissue thickening, reflecting chronic inflammation. At the site of the prior large collection of air along the medial right buttocks, a smaller collection of air and trace fluid is now seen, measuring approximately 7.5 cm, with associated new soft tissue inflammation. More prominent overlying skin thickening is also seen. Musculoskeletal: The visualized musculature is grossly  unremarkable. No acute osseous abnormalities are seen. Evaluation for osteomyelitis is limited on CT. IMPRESSION: 1. Surgical defect extending nearly to the level of the coccyx, with air tracking superiorly from this location just superficial to the paraspinal musculature, and new associated soft tissue thickening, reflecting chronic inflammation. Persistent infection cannot be excluded. 2. At the site of the prior large collection of air along the medial right buttock, a smaller collection of air  and trace fluid is now seen, measuring approximately 7.5 cm, with new surrounding soft tissue inflammation and thickening, concerning for evolving abscess. More prominent overlying skin thickening noted. These results were called by telephone at the time of interpretation on 04/11/2016 at 1:28 am to Dr. Thayer Jew, who verbally acknowledged these results. Electronically Signed   By: Garald Balding M.D.   On: 04/11/2016 01:31    Procedures Procedures (including critical care time)  Medications Ordered in ED Medications  sodium chloride 0.9 % bolus 1,000 mL (1,000 mLs Intravenous New Bag/Given 04/11/16 0052)    And  sodium chloride 0.9 % bolus 1,000 mL (not administered)    And  sodium chloride 0.9 % bolus 1,000 mL (not administered)  0.9 %  sodium chloride infusion (not administered)  HYDROcodone-acetaminophen (NORCO/VICODIN) 5-325 MG per tablet 1 tablet (not administered)  insulin aspart protamine- aspart (NOVOLOG MIX 70/30) injection 40 Units (not administered)  acetaminophen (TYLENOL) tablet 650 mg (not administered)  ondansetron (ZOFRAN) injection 4 mg (not administered)  morphine 2 MG/ML injection 2 mg (not administered)  insulin aspart (novoLOG) injection 0-9 Units (not administered)  sodium chloride flush (NS) 0.9 % injection 3 mL (not administered)  senna-docusate (Senokot-S) tablet 1 tablet (not administered)  zolpidem (AMBIEN) tablet 5 mg (not administered)  albuterol (PROVENTIL) (2.5  MG/3ML) 0.083% nebulizer solution 2.5 mg (not administered)  piperacillin-tazobactam (ZOSYN) IVPB 3.375 g (not administered)  piperacillin-tazobactam (ZOSYN) IVPB 3.375 g (0 g Intravenous Stopped 04/11/16 0140)  vancomycin (VANCOCIN) IVPB 1000 mg/200 mL premix (1,000 mg Intravenous New Bag/Given 04/11/16 0140)  iopamidol (ISOVUE-300) 61 % injection 100 mL (100 mLs Intravenous Contrast Given 04/11/16 0103)     Initial Impression / Assessment and Plan / ED Course  I have reviewed the triage vital signs and the nursing notes.  Pertinent labs & imaging results that were available during my care of the patient were reviewed by me and considered in my medical decision making (see chart for details).     Patient presents with worsening pain purulent from sacral wound. He is also noted increased swelling over the right buttock. Temperature here 100.3. Mildly tachycardic. Sepsis workup initiated. Patient is hyperglycemic. He was given 30 mL/kg fluid. Given history and physical exam, necrotizing fasciitis would certainly be a consideration. Patient given vancomycin and Zosyn. CT ordered. CT shows chronic inflammation with likely a new evolving abscess over the right buttock. Discussed the case with Dr. Lucia Gaskins. General surgery to evaluate in the morning. Will admit for IV antibiotics and glucose control. Patient's vital signs have remained stable. He is not hypotensive. Discussed with Dr. Blaine Hamper.  Final Clinical Impressions(s) / ED Diagnoses   Final diagnoses:  Wound of sacral region, initial encounter  Abscess  Hyperglycemia    New Prescriptions New Prescriptions   No medications on file   I personally performed the services described in this documentation, which was scribed in my presence. The recorded information has been reviewed and is accurate.    Merryl Hacker, MD 04/11/16 [phone]

## 2016-04-10 NOTE — ED Notes (Signed)
Bed: IW58 Expected date: 04/10/16 Expected time:  Means of arrival:  Comments: EMS- post-surgical wound

## 2016-04-10 NOTE — ED Triage Notes (Signed)
Pt brought from home via EMS for wound infection on lower central back.  Pt states he had a boil on lower back that was surgically excised before Christmas.  Reports 4/10 pain at site with pus, foul odor, and edema/bruising along right side of back.  Supposed to have follow up appt w/wound care for a wound vac placement but that has not happened.  VS: 122/70, 90 bpm, 16 RR, 96% RA, CBG 455 which pt states is his norm

## 2016-04-11 ENCOUNTER — Telehealth: Payer: Self-pay

## 2016-04-11 ENCOUNTER — Encounter (HOSPITAL_COMMUNITY)
Admission: EM | Disposition: A | Discharge status: Home Health | Payer: Self-pay | Source: Home / Self Care | Attending: Internal Medicine

## 2016-04-11 ENCOUNTER — Inpatient Hospital Stay (HOSPITAL_COMMUNITY): Payer: Self-pay | Admitting: Anesthesiology

## 2016-04-11 ENCOUNTER — Encounter (HOSPITAL_COMMUNITY): Payer: Self-pay

## 2016-04-11 ENCOUNTER — Emergency Department (HOSPITAL_COMMUNITY): Payer: Self-pay

## 2016-04-11 DIAGNOSIS — T148XXA Other injury of unspecified body region, initial encounter: Secondary | ICD-10-CM

## 2016-04-11 DIAGNOSIS — E1165 Type 2 diabetes mellitus with hyperglycemia: Secondary | ICD-10-CM

## 2016-04-11 DIAGNOSIS — T814XXA Infection following a procedure, initial encounter: Secondary | ICD-10-CM

## 2016-04-11 DIAGNOSIS — A419 Sepsis, unspecified organism: Secondary | ICD-10-CM

## 2016-04-11 DIAGNOSIS — L089 Local infection of the skin and subcutaneous tissue, unspecified: Secondary | ICD-10-CM

## 2016-04-11 DIAGNOSIS — R739 Hyperglycemia, unspecified: Secondary | ICD-10-CM | POA: Insufficient documentation

## 2016-04-11 DIAGNOSIS — J45909 Unspecified asthma, uncomplicated: Secondary | ICD-10-CM | POA: Diagnosis present

## 2016-04-11 HISTORY — PX: INCISION AND DRAINAGE PERIRECTAL ABSCESS: SHX1804

## 2016-04-11 LAB — CBC WITH DIFFERENTIAL/PLATELET
BASOS ABS: 0 10*3/uL (ref 0.0–0.1)
Basophils Relative: 0 %
Eosinophils Absolute: 0.1 10*3/uL (ref 0.0–0.7)
Eosinophils Relative: 1 %
HEMATOCRIT: 29.3 % — AB (ref 39.0–52.0)
Hemoglobin: 10.1 g/dL — ABNORMAL LOW (ref 13.0–17.0)
LYMPHS ABS: 2.3 10*3/uL (ref 0.7–4.0)
LYMPHS PCT: 23 %
MCH: 28.3 pg (ref 26.0–34.0)
MCHC: 34.5 g/dL (ref 30.0–36.0)
MCV: 82.1 fL (ref 78.0–100.0)
MONO ABS: 1.6 10*3/uL — AB (ref 0.1–1.0)
Monocytes Relative: 16 %
NEUTROS ABS: 6 10*3/uL (ref 1.7–7.7)
Neutrophils Relative %: 60 %
Platelets: 303 10*3/uL (ref 150–400)
RBC: 3.57 MIL/uL — ABNORMAL LOW (ref 4.22–5.81)
RDW: 12.7 % (ref 11.5–15.5)
WBC: 9.9 10*3/uL (ref 4.0–10.5)

## 2016-04-11 LAB — BASIC METABOLIC PANEL
ANION GAP: 7 (ref 5–15)
BUN: 7 mg/dL (ref 6–20)
CALCIUM: 8.3 mg/dL — AB (ref 8.9–10.3)
CO2: 27 mmol/L (ref 22–32)
CREATININE: 0.63 mg/dL (ref 0.61–1.24)
Chloride: 104 mmol/L (ref 101–111)
Glucose, Bld: 94 mg/dL (ref 65–99)
Potassium: 3.2 mmol/L — ABNORMAL LOW (ref 3.5–5.1)
SODIUM: 138 mmol/L (ref 135–145)

## 2016-04-11 LAB — I-STAT CHEM 8, ED
BUN: 10 mg/dL (ref 6–20)
CALCIUM ION: 1.13 mmol/L — AB (ref 1.15–1.40)
Chloride: 95 mmol/L — ABNORMAL LOW (ref 101–111)
Creatinine, Ser: 0.8 mg/dL (ref 0.61–1.24)
Glucose, Bld: 430 mg/dL — ABNORMAL HIGH (ref 65–99)
HCT: 29 % — ABNORMAL LOW (ref 39.0–52.0)
Hemoglobin: 9.9 g/dL — ABNORMAL LOW (ref 13.0–17.0)
Potassium: 3.8 mmol/L (ref 3.5–5.1)
SODIUM: 136 mmol/L (ref 135–145)
TCO2: 29 mmol/L (ref 0–100)

## 2016-04-11 LAB — URINALYSIS, ROUTINE W REFLEX MICROSCOPIC
BACTERIA UA: NONE SEEN
BILIRUBIN URINE: NEGATIVE
Glucose, UA: >=500 mg/dL — AB
HGB URINE DIPSTICK: NEGATIVE
Ketones, ur: NEGATIVE mg/dL
LEUKOCYTES UA: NEGATIVE
Nitrite: NEGATIVE
PROTEIN: NEGATIVE mg/dL
Specific Gravity, Urine: 1.031 — ABNORMAL HIGH (ref 1.005–1.030)
pH: 6 (ref 5.0–8.0)

## 2016-04-11 LAB — COMPREHENSIVE METABOLIC PANEL
ALBUMIN: 3.1 g/dL — AB (ref 3.5–5.0)
ALT: 10 U/L — ABNORMAL LOW (ref 17–63)
ANION GAP: 10 (ref 5–15)
AST: 18 U/L (ref 15–41)
Alkaline Phosphatase: 97 U/L (ref 38–126)
BILIRUBIN TOTAL: 0.3 mg/dL (ref 0.3–1.2)
BUN: 11 mg/dL (ref 6–20)
CHLORIDE: 98 mmol/L — AB (ref 101–111)
CO2: 25 mmol/L (ref 22–32)
Calcium: 8.8 mg/dL — ABNORMAL LOW (ref 8.9–10.3)
Creatinine, Ser: 0.93 mg/dL (ref 0.61–1.24)
GFR calc Af Amer: >60 mL/min (ref >60)
GFR calc non Af Amer: >60 mL/min (ref >60)
GLUCOSE: 418 mg/dL — AB (ref 65–99)
POTASSIUM: 3.7 mmol/L (ref 3.5–5.1)
SODIUM: 133 mmol/L — AB (ref 135–145)
TOTAL PROTEIN: 6.4 g/dL — AB (ref 6.5–8.1)

## 2016-04-11 LAB — CBC
HCT: 30.7 % — ABNORMAL LOW (ref 39.0–52.0)
HEMOGLOBIN: 10.6 g/dL — AB (ref 13.0–17.0)
MCH: 28.5 pg (ref 26.0–34.0)
MCHC: 34.5 g/dL (ref 30.0–36.0)
MCV: 82.5 fL (ref 78.0–100.0)
PLATELETS: 268 10*3/uL (ref 150–400)
RBC: 3.72 MIL/uL — AB (ref 4.22–5.81)
RDW: 12.8 % (ref 11.5–15.5)
WBC: 9.6 10*3/uL (ref 4.0–10.5)

## 2016-04-11 LAB — GLUCOSE, CAPILLARY
GLUCOSE-CAPILLARY: 106 mg/dL — AB (ref 65–99)
GLUCOSE-CAPILLARY: 181 mg/dL — AB (ref 65–99)
Glucose-Capillary: 201 mg/dL — ABNORMAL HIGH (ref 65–99)
Glucose-Capillary: 106 mg/dL — ABNORMAL HIGH (ref 65–99)
Glucose-Capillary: 82 mg/dL (ref 65–99)

## 2016-04-11 LAB — C-REACTIVE PROTEIN: CRP: 6.6 mg/dL — AB (ref <1.0)

## 2016-04-11 LAB — PROCALCITONIN: Procalcitonin: <0.1 ng/mL

## 2016-04-11 LAB — I-STAT CG4 LACTIC ACID, ED
LACTIC ACID, VENOUS: 2.75 mmol/L — AB (ref 0.5–1.9)
LACTIC ACID, VENOUS: 2.19 mmol/L — AB (ref 0.5–1.9)

## 2016-04-11 LAB — LACTIC ACID, PLASMA
Lactic Acid, Venous: 2.1 mmol/L (ref 0.5–1.9)
Lactic Acid, Venous: 1.2 mmol/L (ref 0.5–1.9)

## 2016-04-11 LAB — APTT: APTT: 28 s (ref 24–36)

## 2016-04-11 LAB — SEDIMENTATION RATE: SED RATE: 17 mm/h — AB (ref 0–16)

## 2016-04-11 LAB — CBG MONITORING, ED: GLUCOSE-CAPILLARY: 405 mg/dL — AB (ref 65–99)

## 2016-04-11 LAB — PROTIME-INR
INR: 0.97
PROTHROMBIN TIME: 12.9 s (ref 11.4–15.2)

## 2016-04-11 LAB — MRSA PCR SCREENING: MRSA BY PCR: NEGATIVE

## 2016-04-11 SURGERY — INCISION AND DRAINAGE, ABSCESS, PERIRECTAL
Anesthesia: General

## 2016-04-11 MED ORDER — SUGAMMADEX SODIUM 200 MG/2ML IV SOLN
INTRAVENOUS | Status: DC | PRN
  Administered 2016-04-11: 200 mg via INTRAVENOUS

## 2016-04-11 MED ORDER — ROCURONIUM BROMIDE 50 MG/5ML IV SOSY
PREFILLED_SYRINGE | INTRAVENOUS | Status: AC
  Filled 2016-04-11: qty 5

## 2016-04-11 MED ORDER — PROPOFOL 10 MG/ML IV BOLUS
INTRAVENOUS | Status: DC | PRN
  Administered 2016-04-11: 170 mg via INTRAVENOUS

## 2016-04-11 MED ORDER — HYDROCODONE-ACETAMINOPHEN 5-325 MG PO TABS
1.0000 | ORAL_TABLET | Freq: Four times a day (QID) | ORAL | Status: DC | PRN
  Administered 2016-04-11 – 2016-04-15 (×9): 1 via ORAL
  Filled 2016-04-11 (×9): qty 1

## 2016-04-11 MED ORDER — INSULIN ASPART 100 UNIT/ML ~~LOC~~ SOLN: 0.0000 [IU] | Freq: Every day | SUBCUTANEOUS | Status: DC

## 2016-04-11 MED ORDER — INSULIN ASPART 100 UNIT/ML ~~LOC~~ SOLN
0.0000 [IU] | Freq: Three times a day (TID) | SUBCUTANEOUS | Status: DC
  Administered 2016-04-11: 9 [IU] via SUBCUTANEOUS
  Filled 2016-04-11: qty 1

## 2016-04-11 MED ORDER — ACETAMINOPHEN 325 MG PO TABS: 650.0000 mg | ORAL_TABLET | Freq: Four times a day (QID) | ORAL | Status: DC | PRN

## 2016-04-11 MED ORDER — LIDOCAINE 2% (20 MG/ML) 5 ML SYRINGE
INTRAMUSCULAR | Status: DC | PRN
  Administered 2016-04-11: 50 mg via INTRAVENOUS

## 2016-04-11 MED ORDER — MEPERIDINE HCL 50 MG/ML IJ SOLN: 6.2500 mg | INTRAMUSCULAR | Status: DC | PRN

## 2016-04-11 MED ORDER — HYDROMORPHONE HCL 1 MG/ML IJ SOLN
INTRAMUSCULAR | Status: AC
  Administered 2016-04-11: 0.5 mg via INTRAVENOUS
  Filled 2016-04-11: qty 1

## 2016-04-11 MED ORDER — INSULIN ASPART PROT & ASPART (70-30 MIX) 100 UNIT/ML ~~LOC~~ SUSP
40.0000 [IU] | Freq: Two times a day (BID) | SUBCUTANEOUS | Status: DC
  Administered 2016-04-11: 40 [IU] via SUBCUTANEOUS
  Filled 2016-04-11: qty 10

## 2016-04-11 MED ORDER — HYDROMORPHONE HCL 1 MG/ML IJ SOLN: 0.5000 mg | INTRAMUSCULAR | Status: DC | PRN

## 2016-04-11 MED ORDER — PHENYLEPHRINE 40 MCG/ML (10ML) SYRINGE FOR IV PUSH (FOR BLOOD PRESSURE SUPPORT)
PREFILLED_SYRINGE | INTRAVENOUS | Status: DC | PRN
  Administered 2016-04-11: 40 ug via INTRAVENOUS
  Administered 2016-04-11 (×3): 80 ug via INTRAVENOUS

## 2016-04-11 MED ORDER — PIPERACILLIN-TAZOBACTAM 3.375 G IVPB 30 MIN: 3.3750 g | Freq: Three times a day (TID) | INTRAVENOUS | Status: DC

## 2016-04-11 MED ORDER — SENNOSIDES-DOCUSATE SODIUM 8.6-50 MG PO TABS: 1.0000 | ORAL_TABLET | Freq: Every evening | ORAL | Status: DC | PRN

## 2016-04-11 MED ORDER — IOPAMIDOL (ISOVUE-300) INJECTION 61%
INTRAVENOUS | Status: AC
Start: 2016-04-11 — End: 2016-04-11
  Administered 2016-04-11: 100 mL via INTRAVENOUS
  Filled 2016-04-11: qty 100

## 2016-04-11 MED ORDER — MIDAZOLAM HCL 5 MG/5ML IJ SOLN
INTRAMUSCULAR | Status: DC | PRN
  Administered 2016-04-11: 2 mg via INTRAVENOUS

## 2016-04-11 MED ORDER — CYCLOBENZAPRINE HCL 5 MG PO TABS
5.0000 mg | ORAL_TABLET | Freq: Once | ORAL | Status: AC
  Administered 2016-04-11: 5 mg via ORAL
  Filled 2016-04-11: qty 1

## 2016-04-11 MED ORDER — DEXTROSE-NACL 5-0.45 % IV SOLN
INTRAVENOUS | Status: DC
  Administered 2016-04-11: 15:00:00 via INTRAVENOUS

## 2016-04-11 MED ORDER — IOPAMIDOL (ISOVUE-300) INJECTION 61%
100.0000 mL | Freq: Once | INTRAVENOUS | Status: AC | PRN
  Administered 2016-04-11: 100 mL via INTRAVENOUS

## 2016-04-11 MED ORDER — FENTANYL CITRATE (PF) 100 MCG/2ML IJ SOLN
INTRAMUSCULAR | Status: DC | PRN
  Administered 2016-04-11: 100 ug via INTRAVENOUS

## 2016-04-11 MED ORDER — SODIUM CHLORIDE 0.9% FLUSH
3.0000 mL | Freq: Two times a day (BID) | INTRAVENOUS | Status: DC
  Administered 2016-04-11 – 2016-04-17 (×6): 3 mL via INTRAVENOUS

## 2016-04-11 MED ORDER — LACTATED RINGERS IV SOLN
INTRAVENOUS | Status: DC
  Administered 2016-04-11: 1000 mL via INTRAVENOUS

## 2016-04-11 MED ORDER — HYDROMORPHONE HCL 1 MG/ML IJ SOLN
0.2500 mg | INTRAMUSCULAR | Status: DC | PRN
  Administered 2016-04-11 (×2): 0.5 mg via INTRAVENOUS

## 2016-04-11 MED ORDER — PROMETHAZINE HCL 25 MG/ML IJ SOLN
6.2500 mg | INTRAMUSCULAR | Status: DC | PRN
  Administered 2016-04-11: 6.25 mg via INTRAVENOUS

## 2016-04-11 MED ORDER — ZOLPIDEM TARTRATE 5 MG PO TABS
5.0000 mg | ORAL_TABLET | Freq: Every evening | ORAL | Status: DC | PRN
  Administered 2016-04-12 – 2016-04-13 (×2): 5 mg via ORAL
  Filled 2016-04-11 (×2): qty 1

## 2016-04-11 MED ORDER — LACTATED RINGERS IV SOLN
INTRAVENOUS | Status: DC | PRN
  Administered 2016-04-11: 11:00:00 via INTRAVENOUS

## 2016-04-11 MED ORDER — LIDOCAINE 2% (20 MG/ML) 5 ML SYRINGE
INTRAMUSCULAR | Status: AC
  Filled 2016-04-11: qty 5

## 2016-04-11 MED ORDER — ALBUTEROL SULFATE (2.5 MG/3ML) 0.083% IN NEBU: 2.5000 mg | INHALATION_SOLUTION | Freq: Four times a day (QID) | RESPIRATORY_TRACT | Status: DC | PRN

## 2016-04-11 MED ORDER — SUCCINYLCHOLINE CHLORIDE 200 MG/10ML IV SOSY
PREFILLED_SYRINGE | INTRAVENOUS | Status: DC | PRN
  Administered 2016-04-11: 160 mg via INTRAVENOUS

## 2016-04-11 MED ORDER — ONDANSETRON HCL 4 MG/2ML IJ SOLN
INTRAMUSCULAR | Status: DC | PRN
  Administered 2016-04-11: 4 mg via INTRAVENOUS

## 2016-04-11 MED ORDER — ONDANSETRON HCL 4 MG/2ML IJ SOLN: 4.0000 mg | Freq: Three times a day (TID) | INTRAMUSCULAR | Status: DC | PRN

## 2016-04-11 MED ORDER — MIDAZOLAM HCL 2 MG/2ML IJ SOLN
INTRAMUSCULAR | Status: AC
  Filled 2016-04-11: qty 2

## 2016-04-11 MED ORDER — PROMETHAZINE HCL 25 MG/ML IJ SOLN
INTRAMUSCULAR | Status: AC
  Administered 2016-04-11: 6.25 mg via INTRAVENOUS
  Filled 2016-04-11: qty 1

## 2016-04-11 MED ORDER — ROCURONIUM BROMIDE 10 MG/ML (PF) SYRINGE
PREFILLED_SYRINGE | INTRAVENOUS | Status: DC | PRN
  Administered 2016-04-11: 5 mg via INTRAVENOUS

## 2016-04-11 MED ORDER — SUCCINYLCHOLINE CHLORIDE 200 MG/10ML IV SOSY
PREFILLED_SYRINGE | INTRAVENOUS | Status: AC
  Filled 2016-04-11: qty 10

## 2016-04-11 MED ORDER — PIPERACILLIN-TAZOBACTAM 3.375 G IVPB
3.3750 g | Freq: Three times a day (TID) | INTRAVENOUS | Status: DC
  Administered 2016-04-11 – 2016-04-17 (×18): 3.375 g via INTRAVENOUS
  Filled 2016-04-11 (×18): qty 50

## 2016-04-11 MED ORDER — SODIUM CHLORIDE 0.9 % IV SOLN
INTRAVENOUS | Status: DC
  Administered 2016-04-11 – 2016-04-15 (×5): via INTRAVENOUS

## 2016-04-11 MED ORDER — MORPHINE SULFATE (PF) 2 MG/ML IV SOLN
2.0000 mg | INTRAVENOUS | Status: DC | PRN
  Filled 2016-04-11: qty 1

## 2016-04-11 MED ORDER — PROPOFOL 10 MG/ML IV BOLUS
INTRAVENOUS | Status: AC
  Filled 2016-04-11: qty 20

## 2016-04-11 MED ORDER — ONDANSETRON HCL 4 MG/2ML IJ SOLN
4.0000 mg | Freq: Once | INTRAMUSCULAR | Status: AC | PRN
  Administered 2016-04-11: 4 mg via INTRAVENOUS

## 2016-04-11 MED ORDER — ONDANSETRON HCL 4 MG/2ML IJ SOLN
INTRAMUSCULAR | Status: AC
  Administered 2016-04-11: 4 mg via INTRAVENOUS
  Filled 2016-04-11: qty 2

## 2016-04-11 MED ORDER — FENTANYL CITRATE (PF) 250 MCG/5ML IJ SOLN
INTRAMUSCULAR | Status: AC
  Filled 2016-04-11: qty 5

## 2016-04-11 MED ORDER — INSULIN ASPART 100 UNIT/ML ~~LOC~~ SOLN
0.0000 [IU] | Freq: Three times a day (TID) | SUBCUTANEOUS | Status: DC
Start: 2016-04-11 — End: 2016-04-12
  Administered 2016-04-12: 11 [IU] via SUBCUTANEOUS

## 2016-04-11 MED ORDER — PHENYLEPHRINE 40 MCG/ML (10ML) SYRINGE FOR IV PUSH (FOR BLOOD PRESSURE SUPPORT)
PREFILLED_SYRINGE | INTRAVENOUS | Status: AC
  Filled 2016-04-11: qty 10

## 2016-04-11 MED ORDER — ALBUTEROL SULFATE (2.5 MG/3ML) 0.083% IN NEBU: 2.5000 mg | INHALATION_SOLUTION | RESPIRATORY_TRACT | Status: DC | PRN

## 2016-04-11 MED ORDER — VANCOMYCIN HCL IN DEXTROSE 1-5 GM/200ML-% IV SOLN
1000.0000 mg | Freq: Three times a day (TID) | INTRAVENOUS | Status: DC
  Administered 2016-04-11 – 2016-04-16 (×15): 1000 mg via INTRAVENOUS
  Filled 2016-04-11 (×15): qty 200

## 2016-04-11 MED ORDER — BUPIVACAINE HCL (PF) 0.25 % IJ SOLN
INTRAMUSCULAR | Status: AC
  Filled 2016-04-11: qty 30

## 2016-04-11 SURGICAL SUPPLY — 19 items
BLADE SURG SZ20 CARB STEEL (BLADE) ×3 IMPLANT
COVER SURGICAL LIGHT HANDLE (MISCELLANEOUS) ×3 IMPLANT
DRAPE SHEET LG 3/4 BI-LAMINATE (DRAPES) ×3 IMPLANT
DRSG VAC ATS SM SENSATRAC (GAUZE/BANDAGES/DRESSINGS) ×3 IMPLANT
ELECT PENCIL ROCKER SW 15FT (MISCELLANEOUS) ×3 IMPLANT
GAUZE IODOFORM PACK 1/2 7832 (GAUZE/BANDAGES/DRESSINGS) ×3 IMPLANT
GAUZE SPONGE 4X4 12PLY STRL (GAUZE/BANDAGES/DRESSINGS) ×3 IMPLANT
GAUZE SPONGE 4X4 16PLY XRAY LF (GAUZE/BANDAGES/DRESSINGS) ×3 IMPLANT
GLOVE BIOGEL M 8.0 STRL (GLOVE) ×3 IMPLANT
GLOVE BIOGEL PI IND STRL 7.0 (GLOVE) ×1 IMPLANT
GLOVE BIOGEL PI INDICATOR 7.0 (GLOVE) ×2
GOWN SPEC L4 XLG W/TWL (GOWN DISPOSABLE) ×3 IMPLANT
GOWN STRL REUS W/TWL LRG LVL3 (GOWN DISPOSABLE) ×3 IMPLANT
GOWN STRL REUS W/TWL XL LVL3 (GOWN DISPOSABLE) ×9 IMPLANT
KIT BASIN OR (CUSTOM PROCEDURE TRAY) ×3 IMPLANT
PACK LITHOTOMY IV (CUSTOM PROCEDURE TRAY) ×3 IMPLANT
SWAB CULTURE ESWAB REG 1ML (MISCELLANEOUS) ×3 IMPLANT
TOWEL OR 17X26 10 PK STRL BLUE (TOWEL DISPOSABLE) ×3 IMPLANT
YANKAUER SUCT BULB TIP 10FT TU (MISCELLANEOUS) ×6 IMPLANT

## 2016-04-11 NOTE — Anesthesia Postprocedure Evaluation (Signed)
Anesthesia Post Note  Patient: [name] [name]  Procedure(s) Performed: Procedure(s) (LRB): IRRIGATION AND DEBRIDEMENT RECTAL ABSCESS (N/A)  Patient location during evaluation: MAU Anesthesia Type: General Level of consciousness: awake and alert and oriented Pain management: pain level controlled Vital Signs Assessment: post-procedure vital signs reviewed and stable Respiratory status: spontaneous breathing, nonlabored ventilation and respiratory function stable Cardiovascular status: blood pressure returned to baseline and stable Postop Assessment: no signs of nausea or vomiting Anesthetic complications: no       Last Vitals:  Vitals:   04/11/16 1345 04/11/16 1400  BP: 112/77   Pulse: 91 89  Resp: 13 15  Temp:      Last Pain:  Vitals:   04/11/16 1059  TempSrc:   PainSc: 2                  Abdirahman Chittum A.

## 2016-04-11 NOTE — Op Note (Signed)
Surgeon: Kaylyn Lim, MD, FACS  Asst:  none  Anes:  general  Preop Dx: Buttock abscess Postop Dx: Right recurrent buttock abscess  Procedure: I & D of abscess; debridement of dead fascia and placement of VAC Location Surgery: [facility] Complications: none  EBL:   10 cc  Drains: VAC  Description of Procedure:  The patient was taken to OR 6 .  After anesthesia was administered and the patient was prepped a timeout was performed.  The patient was in the prone position.  The fluctuant area in the superior right gluteus was drained through the granulating supraanal cavity that was created at the first debridement a month ago at [facility].  The base of the abscess cavity was debrided sharply with scissors and abraded with packing.  Wound VAC gray packing was inserted into the depths of the wound.  The surrounding skin was shaved and cleaned with rubbing alcohol.  The VAC was applied.  The picture below shows the site of the wounds and the areas that are undermined.        The patient tolerated the procedure well and was taken to the PACU in stable condition.     Matt B. Hassell Done, [place], [facility], [place]

## 2016-04-11 NOTE — Anesthesia Procedure Notes (Signed)
Procedure Name: Intubation Date/Time: 04/11/2016 11:41 AM Performed by: Anne Fu Pre-anesthesia Checklist: Patient identified, Emergency Drugs available, Suction available, Patient being monitored and Timeout performed Patient Re-evaluated:Patient Re-evaluated prior to inductionOxygen Delivery Method: Circle system utilized Preoxygenation: Pre-oxygenation with 100% oxygen Intubation Type: Cricoid Pressure applied, Rapid sequence and IV induction Laryngoscope Size: Mac and 4 Grade View: Grade I Tube type: Oral Tube size: 7.5 mm Number of attempts: 1 Airway Equipment and Method: Stylet Placement Confirmation: ETT inserted through vocal cords under direct vision,  positive ETCO2,  CO2 detector and breath sounds checked- equal and bilateral Secured at: 22 cm Tube secured with: Tape Dental Injury: Teeth and Oropharynx as per pre-operative assessment

## 2016-04-11 NOTE — H&P (Signed)
History and Physical    [name] [name] RWE:[id number] DOB: [birth year] DOA: 04/10/2016  Referring MD/NP/PA:   PCP: Maren Reamer, MD   Patient coming from:  The patient is coming from home.  At baseline, pt is independent for most of ADL.      Chief Complaint: wound infection  HPI: [name] [name] is a 31 y.o. male with medical history significant of poorly controlled diabetes, HTN, asthma, who presents with wound infection.  Patient states that he is s/p sacral abscess removal that was performed on 02/24/2016. He completed antibiotic treatment. he was supposed to have follow up appt w/wound care for a wound vac placement but that has not happened. He states that has worsening pain over the wound in sacral area, which is constant, 7 out of10 in severity, nonradiating. Pt notes bloody drainage from the area today. He denies subjective fever, but his temperature is 100.3. No chills. Patient denies chest pain, SOB, cough, nausea, vomiting, diarrhea, abdominal pain, symptoms of UTI or unilateral weakness.  ED Course: pt was found to have WBC 9.9, lactate of 2.75, temperature 100.3, tachycardia, saturation 96% on room air. CT abdomen/pelvis showed chronic inflammation with likely a new evolving abscess over the right buttock. Pt is admitted to telemetry bed as inpatient. Gen. surgeon, Dr. Lucia Gaskins was consulted by EDP.  Review of Systems:   General: has fevers, no chills, no changes in body weight, has fatigue HEENT: no blurry vision, hearing changes or sore throat Respiratory: no dyspnea, coughing, wheezing CV: no chest pain, no palpitations GI: no nausea, vomiting, abdominal pain, diarrhea, constipation GU: no dysuria, burning on urination, increased urinary frequency, hematuria  Ext: no leg edema Neuro: no unilateral weakness, numbness, or tingling, no vision change or hearing loss Skin: has wound in sacral area with infection. MSK: No muscle spasm, no deformity, no limitation of range of  movement in spin Heme: No easy bruising.  Travel history: No recent long distant travel.  Allergy: No Known Allergies  Past Medical History:  Diagnosis Date  . Asthma   . Diabetes mellitus without complication ([place])   . Hypertension     Past Surgical History:  Procedure Laterality Date  . HERNIA REPAIR     x2 when he was a baby  . INCISION AND DRAINAGE ABSCESS Right 09/03/2015   Procedure: INCISION AND DRAINAGE ABSCESS;  Surgeon: Roseanne Kaufman, MD;  Location: [facility];  Service: Orthopedics;  Laterality: Right;  . IRRIGATION AND DEBRIDEMENT ABSCESS Left 08/31/2015   Procedure: IRRIGATION AND DEBRIDEMENT ABSCESS LEFT SHOULDER;  Surgeon: Alphonsa Overall, MD;  Location: [facility];  Service: General;  Laterality: Left;  . WOUND DEBRIDEMENT N/A 02/24/2016   Procedure: SACRAL WOUND DEBRIDEMENT;  Surgeon: Georganna Skeans, MD;  Location: [place];  Service: General;  Laterality: N/A;    Social History:  reports that he has never smoked. He has never used smokeless tobacco. He reports that he uses drugs, including Marijuana. He reports that he does not drink alcohol.  Family History:  Family History  Problem Relation Age of Onset  . Diabetes type II Mother   . Diabetes type II Father      Prior to Admission medications   Medication Sig Start Date End Date Taking? Authorizing Provider  acetaminophen (TYLENOL) 500 MG tablet Take 1,000 mg by mouth every 6 (six) hours as needed (for pain).   Yes Historical Provider, MD  insulin aspart protamine- aspart (NOVOLOG MIX 70/30) (70-30) 100 UNIT/ML injection Inject 0.4 mLs (40 Units total) into the  skin 2 (two) times daily with a meal. 04/10/16  Yes Maren Reamer, MD  Blood Glucose Monitoring Suppl (TRUE METRIX GO GLUCOSE METER) w/Device KIT 1 each by Does not apply route every 8 (eight) hours as needed. 04/10/16   Maren Reamer, MD  glucose blood test strip Use as instructed 04/10/16   Maren Reamer, MD  HYDROcodone-acetaminophen (NORCO/VICODIN) 5-325  MG tablet Take 1 tablet by mouth every 6 (six) hours as needed. Patient not taking: Reported on 04/10/2016 02/16/16   Shawn C Joy, PA-C  Insulin Syringe 27G X 1/2" 1 ML MISC 1 application by Does not apply route 2 (two) times daily. 04/10/16   Maren Reamer, MD  naproxen (NAPROSYN) 500 MG tablet Take 1 tablet (500 mg total) by mouth 2 (two) times daily. Patient not taking: Reported on 04/10/2016 02/16/16   Shawn C Joy, PA-C  oxyCODONE (OXY IR/ROXICODONE) 5 MG immediate release tablet Take 1 tablet (5 mg total) by mouth every 6 (six) hours as needed for moderate pain or severe pain. Patient not taking: Reported on 04/10/2016 02/29/16   Venetia Maxon Rama, MD  TRUEPLUS LANCETS 26G MISC 1 each by Does not apply route every 8 (eight) hours as needed. 04/10/16   Maren Reamer, MD    Physical Exam: Vitals:   04/10/16 2248 04/11/16 0057 04/11/16 0127  BP: 134/89  131/77  Pulse: 111  114  Resp: 18  15  Temp: 100.3 F (37.9 C)    TempSrc: Oral    SpO2: 98%  96%  Weight:  95.3 kg (210 lb)   Height:  '5\' 7"'  (1.702 m)    General: Not in acute distress HEENT:       Eyes: PERRL, EOMI, no scleral icterus.       ENT: No discharge from the ears and nose, no pharynx injection, no tonsillar enlargement.        Neck: No JVD, no bruit, no mass felt. Heme: No neck lymph node enlargement. Cardiac: S1/S2, RRR, No murmurs, No gallops or rubs. Respiratory: No rales, wheezing, rhonchi or rubs. GI: Soft, nondistended, nontender, no rebound pain, no organomegaly, BS present. GU: No hematuria Ext: No pitting leg edema bilaterally. 2+DP/PT pulse bilaterally. Musculoskeletal: No joint deformities, No joint redness or warmth, no limitation of ROM in spin. Skin: Sacral wound is deep, w/ some granulation tissue noted and foul smelling purulent drainage. Neuro: Alert, oriented X3, cranial nerves II-XII grossly intact, moves all extremities normally  Psych: Patient is not psychotic, no suicidal or hemocidal  ideation.  Labs on Admission: I have personally reviewed following labs and imaging studies  CBC:  Recent Labs Lab 04/10/16 1125 04/11/16 0045 04/11/16 0057  WBC 8.0 9.9  --   NEUTROABS 5,680 6.0  --   HGB 11.0* 10.1* 9.9*  HCT 34.0* 29.3* 29.0*  MCV 86.1 82.1  --   PLT 351 303  --    Basic Metabolic Panel:  Recent Labs Lab 04/10/16 1125 04/11/16 0045 04/11/16 0057  NA 134* 133* 136  K 3.5 3.7 3.8  CL 98 98* 95*  CO2 28 25  --   GLUCOSE 323* 418* 430*  BUN '9 11 10  ' CREATININE 0.73 0.93 0.80  CALCIUM 8.8 8.8*  --    GFR: Estimated Creatinine Clearance: 149.9 mL/min (by C-G formula based on SCr of 0.8 mg/dL). Liver Function Tests:  Recent Labs Lab 04/11/16 0045  AST 18  ALT 10*  ALKPHOS 97  BILITOT 0.3  PROT 6.4*  ALBUMIN 3.1*   No results for input(s): LIPASE, AMYLASE in the last 168 hours. No results for input(s): AMMONIA in the last 168 hours. Coagulation Profile: No results for input(s): INR, PROTIME in the last 168 hours. Cardiac Enzymes: No results for input(s): CKTOTAL, CKMB, CKMBINDEX, TROPONINI in the last 168 hours. BNP (last 3 results) No results for input(s): PROBNP in the last 8760 hours. HbA1C: No results for input(s): HGBA1C in the last 72 hours. CBG:  Recent Labs Lab 04/10/16 2255  GLUCAP 374*   Lipid Profile: No results for input(s): CHOL, HDL, LDLCALC, TRIG, CHOLHDL, LDLDIRECT in the last 72 hours. Thyroid Function Tests: No results for input(s): TSH, T4TOTAL, FREET4, T3FREE, THYROIDAB in the last 72 hours. Anemia Panel: No results for input(s): VITAMINB12, FOLATE, FERRITIN, TIBC, IRON, RETICCTPCT in the last 72 hours. Urine analysis:    Component Value Date/Time   COLORURINE YELLOW 04/11/2016 0227   APPEARANCEUR CLEAR 04/11/2016 0227   LABSPEC 1.031 (H) 04/11/2016 0227   PHURINE 6.0 04/11/2016 0227   GLUCOSEU >=500 (A) 04/11/2016 0227   HGBUR NEGATIVE 04/11/2016 0227   BILIRUBINUR NEGATIVE 04/11/2016 0227   KETONESUR  NEGATIVE 04/11/2016 0227   PROTEINUR NEGATIVE 04/11/2016 0227   NITRITE NEGATIVE 04/11/2016 0227   LEUKOCYTESUR NEGATIVE 04/11/2016 0227   Sepsis Labs: '@LABRCNTIP' (procalcitonin:4,lacticidven:4) )No results found for this or any previous visit (from the past 240 hour(s)).   Radiological Exams on Admission: Ct Pelvis W Contrast  Result Date: 04/11/2016 CLINICAL DATA:  Bloody drainage from site of prior surgery for sacral abscess. Initial encounter. EXAM: CT PELVIS WITH CONTRAST TECHNIQUE: Multidetector CT imaging of the pelvis was performed using the standard protocol following the bolus administration of intravenous contrast. CONTRAST:  100 mL of Isovue 300 IV contrast COMPARISON:  CT of the pelvis performed 02/22/2016 FINDINGS: Urinary Tract: The bladder is mildly distended and grossly unremarkable. Bowel: Visualized small and large bowel loops are grossly unremarkable in appearance. Vascular/Lymphatic: The visualized vasculature is grossly unremarkable in appearance. No retroperitoneal or pelvic sidewall lymphadenopathy is seen. No inguinal lymphadenopathy is appreciated. Reproductive: The prostate remains normal in size. The scrotum is unremarkable in appearance. Other: There is a surgical defect extending nearly to the level of the coccyx, with air again noted tracking superiorly from this location just superficial to the paraspinal musculature, and new associated soft tissue thickening, reflecting chronic inflammation. At the site of the prior large collection of air along the medial right buttocks, a smaller collection of air and trace fluid is now seen, measuring approximately 7.5 cm, with associated new soft tissue inflammation. More prominent overlying skin thickening is also seen. Musculoskeletal: The visualized musculature is grossly unremarkable. No acute osseous abnormalities are seen. Evaluation for osteomyelitis is limited on CT. IMPRESSION: 1. Surgical defect extending nearly to the level  of the coccyx, with air tracking superiorly from this location just superficial to the paraspinal musculature, and new associated soft tissue thickening, reflecting chronic inflammation. Persistent infection cannot be excluded. 2. At the site of the prior large collection of air along the medial right buttock, a smaller collection of air and trace fluid is now seen, measuring approximately 7.5 cm, with new surrounding soft tissue inflammation and thickening, concerning for evolving abscess. More prominent overlying skin thickening noted. These results were called by telephone at the time of interpretation on 04/11/2016 at 1:28 am to Dr. Thayer Jew, who verbally acknowledged these results. Electronically Signed   By: Garald Balding M.D.   On: 04/11/2016 01:31  EKG: Independently reviewed. Sinus tachycardia, QTC 430, no ischemic change.   Assessment/Plan Principal Problem:   Wound infection Active Problems:   Uncontrolled diabetes mellitus ([place])   Sepsis, unspecified organism ([place])   Asthma   Wound infection in sacral area and sepsis: CT abdomen/pelvis showed chronic inflammation with likely a new evolving abscess over the right buttock. Pt meets criteria for sepsis with fever, tachycardia, elevated lactate. Currently hemodynamically stable. General surgeon, Dr. Lucia Gaskins was consulted.  - will admit to tele bed as inpt - Empiric antimicrobial treatment with vancomycin and Zosyn per pharmacy - PRN Zofran for nausea, morphine and Norco for pain - Blood cultures x 2  - ESR and CRP - will get Procalcitonin and trend lactic acid levels per sepsis protocol. - IVF: 3.0 L of NS bolus in ED, followed by 125 cc/h - F/u surgery's recommendation  DM-II: Last A1c 13.7, poorly controled. Patient is taking mix insulin 70/30 at home. Blood sugar is 418 with normal AG. No DKA. -will continue home dose of insulin, 70/30, 40 Units bid   -SSI  Asthma: stable -prn albuterol nebs   DVT ppx: SCD Code  Status: Full code Family Communication: Yes, patient's wife at bed side Disposition Plan:  Anticipate discharge back to previous home environment Consults called: General surgery, Dr. Lucia Gaskins  Admission status: inpt / tele       Date of Service 04/11/2016    Ivor Costa [facility] Pager [phone]  If 7PM-7AM, please contact night-coverage [email or web address] Password [email or web address] 04/11/2016, 2:56 AM

## 2016-04-11 NOTE — Progress Notes (Signed)
Patient c/o muscle pain in neck.  PCP was notified and new orders were given to RN.

## 2016-04-11 NOTE — Progress Notes (Signed)
[place] TEAM 1 - Stepdown/ICU TEAM  [name] [name]  VVO:[id number] DOB: 04-Aug-1985 DOA: 04/10/2016 PCP: Maren Reamer, MD    Brief Narrative:  31 y.o. male with history of poorly controlled diabetes, HTN, and asthma who presented with concern for a wound infection.  He is s/p sacral abscess excision on 02/24/2016. He completed antibiotic treatment and was supposed to have a follow up appt w/wound care for a wound vac placement but he was unable to keep this appointment. He presented to the ED c/o worsening pain over the sacral wound area w/ bloody drainage.  CT abdomen/pelvis in the ED showed chronic inflammation with likely a new evolving abscess over the right buttock.   Subjective: Pt is seen for a f/u visit in his room post-op.  He denies cp, sob, n/v, or abdom pain.  He is hungry and thirsty.    Assessment & Plan:  Right recurrent buttock abscess with sepsis  Uncontrolled DM2  Normocytic anemia   HTN  Asthma   DVT prophylaxis: SCDs Code Status: FULL CODE Family Communication: no family present at time of exam  Disposition Plan:   Consultants:  Gen Surgery   Procedures: 1/24  I & D of abscess; debridement of dead fascia and placement of VAC  Antimicrobials:  Zosyn ;1/23 > Vanc 1/23 >  Objective: Blood pressure (!) 135/91, pulse 100, temperature 98.8 F (37.1 C), resp. rate 17, height 5\' 7"  (1.702 m), weight 95.3 kg (210 lb), SpO2 100 %.  Intake/Output Summary (Last 24 hours) at 04/11/16 1609 Last data filed at 04/11/16 1500  Gross per 24 hour  Intake           4740.5 ml  Output                0 ml  Net           4740.5 ml   Filed Weights   04/11/16 0057  Weight: 95.3 kg (210 lb)    Examination: Pt was seen for a f/u visit.    CBC:  Recent Labs Lab 04/10/16 1125 04/11/16 0045 04/11/16 0057  WBC 8.0 9.9  --   NEUTROABS 5,680 6.0  --   HGB 11.0* 10.1* 9.9*  HCT 34.0* 29.3* 29.0*  MCV 86.1 82.1  --   PLT 351 303  --    Basic Metabolic  Panel:  Recent Labs Lab 04/10/16 1125 04/11/16 0045 04/11/16 0057  NA 134* 133* 136  K 3.5 3.7 3.8  CL 98 98* 95*  CO2 28 25  --   GLUCOSE 323* 418* 430*  BUN 9 11 10   CREATININE 0.73 0.93 0.80  CALCIUM 8.8 8.8*  --    GFR: Estimated Creatinine Clearance: 149.9 mL/min (by C-G formula based on SCr of 0.8 mg/dL).  Liver Function Tests:  Recent Labs Lab 04/11/16 0045  AST 18  ALT 10*  ALKPHOS 97  BILITOT 0.3  PROT 6.4*  ALBUMIN 3.1*   Coagulation Profile:  Recent Labs Lab 04/11/16 0330  INR 0.97    HbA1C: Hgb A1c MFr Bld  Date/Time Value Ref Range Status  02/23/2016 12:33 AM 13.7 (H) 4.8 - 5.6 % Final    Comment:    (NOTE)         Pre-diabetes: 5.7 - 6.4         Diabetes: >6.4         Glycemic control for adults with diabetes: <7.0   08/31/2015 10:20 AM 13.7 (H) 4.8 - 5.6 % Final  Comment:    (NOTE)         Pre-diabetes: 5.7 - 6.4         Diabetes: >6.4         Glycemic control for adults with diabetes: <7.0     CBG:  Recent Labs Lab 04/10/16 2255 04/11/16 0808 04/11/16 1115 04/11/16 1325 04/11/16 1419  GLUCAP 374* 405* 201* 106* 82    Scheduled Meds: . insulin aspart  0-9 Units Subcutaneous TID WC  . insulin aspart protamine- aspart  40 Units Subcutaneous BID WC  . piperacillin-tazobactam (ZOSYN)  IV  3.375 g Intravenous Q8H  . sodium chloride flush  3 mL Intravenous Q12H  . vancomycin  1,000 mg Intravenous Q8H     LOS: 0 days   Time spent: No Charge  Cherene Altes, MD [facility] Office  [phone] Pager - Text Page per Amion as per below:  On-Call/Text Page:      [email or web address]      password [email or web address]  If 7PM-7AM, please contact night-coverage [email or web address] Password [email or web address] 04/11/2016, 4:09 PM

## 2016-04-11 NOTE — H&P (View-Only) (Signed)
Reason for Consult:  Sacral Decubitus Referring Physician: Hervey Wedig is an 31 y.o. male.  HPI: Pt with multiple medical issues presents with right buttocks abscess, fever and lactic acidosis.  He was hospitalized from 12/6-12/13/2017, with sepsis, uncontrolled diabetes, DKA, Acute renal failure.  He underwent sacral wound debridement on 02/22/16 by Dr. Georganna Skeans, and then had local wound care, with hydrotherapy, and plans to follow up in the Wound clinic. Dr. Hulen Skains even considered a possible evaluation by Plastic[facility]. He has been seen by Dr. Leanne Chang in the interim but has been non compliant with medications because he cannot afford them. He was seen in the [facility] and Wellness clinic yesterday and sent to the ED.  He was to be seen at [facility] in December, but could not get to the site at the scheduled time.  Next appointment is 04/18/16.    Work up in the ED shows a low grad fever 100.3, tachycardia, some hypertension, glucoses up in the 300 -430 range, lactate 2.75, WBC is normal.  CT of the pelvis this AM: . 1. Surgical defect extending nearly to the level of the coccyx, with air tracking superiorly from this location just superficial to the paraspinal musculature, and new associated soft tissue thickening, reflecting chronic inflammation. Persistent infection cannot be excluded. 2. At the site of the prior large collection of air along the medial right buttock, a smaller collection of air and trace fluid is now seen, measuring approximately 7.5 cm, with new surrounding soft tissue inflammation and thickening, concerning for evolving abscess. More prominent overlying skin thickening noted.  We are ask to see. He is in the Ed, with draining purulent material from open site.  There is a new site superior and to the right of the open sacral area that had been treated in December.This area is about 10 cm in diameter, raised and erythematous, fluctuant, and he says when he presses  it it sounds like a fart.  See picture below.   . sodium chloride 125 mL/hr at 04/11/16 0328  . piperacillin-tazobactam (ZOSYN)  IV Stopped (04/11/16 0657)  . vancomycin 1,000 mg (04/11/16 0654)   . insulin aspart  0-9 Units Subcutaneous TID WC  . insulin aspart protamine- aspart  40 Units Subcutaneous BID WC  . sodium chloride flush  3 mL Intravenous Q12H     Past Medical History:  Diagnosis Date  . Asthma   . Diabetes mellitus without complication ([facility])   . Hypertension     Past Surgical History:  Procedure Laterality Date  . HERNIA REPAIR     x2 when he was a baby  . INCISION AND DRAINAGE ABSCESS Right 09/03/2015   Procedure: INCISION AND DRAINAGE ABSCESS;  Surgeon: Roseanne Kaufman, MD;  Location: [facility] ORS;  Service: Orthopedics;  Laterality: Right;  . IRRIGATION AND DEBRIDEMENT ABSCESS Left 08/31/2015   Procedure: IRRIGATION AND DEBRIDEMENT ABSCESS LEFT SHOULDER;  Surgeon: Alphonsa Overall, MD;  Location: [facility] ORS;  Service: General;  Laterality: Left;  . WOUND DEBRIDEMENT N/A 02/24/2016   Procedure: SACRAL WOUND DEBRIDEMENT;  Surgeon: Georganna Skeans, MD;  Location: [facility] OR;  Service: General;  Laterality: N/A;    Family History  Problem Relation Age of Onset  . Diabetes type II Mother   . Diabetes type II Father     Social History:  reports that he has never smoked. He has never used smokeless tobacco. He reports that he uses drugs, including Marijuana. He reports that he does not drink alcohol.  Allergies: No Known Allergies  Prior to Admission medications   Medication Sig Start Date End Date Taking? Authorizing Provider  acetaminophen (TYLENOL) 500 MG tablet Take 1,000 mg by mouth every 6 (six) hours as needed (for pain).   Yes Historical Provider, MD  insulin aspart protamine- aspart (NOVOLOG MIX 70/30) (70-30) 100 UNIT/ML injection Inject 0.4 mLs (40 Units total) into the skin 2 (two) times daily with a meal. 04/10/16  Yes Maren Reamer, MD  Blood Glucose Monitoring Suppl  (TRUE METRIX GO GLUCOSE METER) w/Device KIT 1 each by Does not apply route every 8 (eight) hours as needed. 04/10/16   Maren Reamer, MD  glucose blood test strip Use as instructed 04/10/16   Maren Reamer, MD  HYDROcodone-acetaminophen (NORCO/VICODIN) 5-325 MG tablet Take 1 tablet by mouth every 6 (six) hours as needed. Patient not taking: Reported on 04/10/2016 02/16/16   Shawn C Joy, PA-C  Insulin Syringe 27G X 1/2" 1 ML MISC 1 application by Does not apply route 2 (two) times daily. 04/10/16   Maren Reamer, MD  naproxen (NAPROSYN) 500 MG tablet Take 1 tablet (500 mg total) by mouth 2 (two) times daily. Patient not taking: Reported on 04/10/2016 02/16/16   Shawn C Joy, PA-C  oxyCODONE (OXY IR/ROXICODONE) 5 MG immediate release tablet Take 1 tablet (5 mg total) by mouth every 6 (six) hours as needed for moderate pain or severe pain. Patient not taking: Reported on 04/10/2016 02/29/16   Venetia Maxon Rama, MD  TRUEPLUS LANCETS 26G MISC 1 each by Does not apply route every 8 (eight) hours as needed. 04/10/16   Maren Reamer, MD     Results for orders placed or performed during the hospital encounter of 04/10/16 (from the past 48 hour(s))  CBG monitoring, ED     Status: Abnormal   Collection Time: 04/10/16 10:55 PM  Result Value Ref Range   Glucose-Capillary 374 (H) 65 - 99 mg/dL  Comprehensive metabolic panel     Status: Abnormal   Collection Time: 04/11/16 12:45 AM  Result Value Ref Range   Sodium 133 (L) 135 - 145 mmol/L   Potassium 3.7 3.5 - 5.1 mmol/L   Chloride 98 (L) 101 - 111 mmol/L   CO2 25 22 - 32 mmol/L   Glucose, Bld 418 (H) 65 - 99 mg/dL   BUN 11 6 - 20 mg/dL   Creatinine, Ser 0.93 0.61 - 1.24 mg/dL   Calcium 8.8 (L) 8.9 - 10.3 mg/dL   Total Protein 6.4 (L) 6.5 - 8.1 g/dL   Albumin 3.1 (L) 3.5 - 5.0 g/dL   AST 18 15 - 41 U/L   ALT 10 (L) 17 - 63 U/L   Alkaline Phosphatase 97 38 - 126 U/L   Total Bilirubin 0.3 0.3 - 1.2 mg/dL   GFR calc non Af Amer >60 >60 mL/min    GFR calc Af Amer >60 >60 mL/min    Comment: (NOTE) The eGFR has been calculated using the CKD EPI equation. This calculation has not been validated in all clinical situations. eGFR's persistently <60 mL/min signify possible Chronic Kidney Disease.    Anion gap 10 5 - 15  CBC WITH DIFFERENTIAL     Status: Abnormal   Collection Time: 04/11/16 12:45 AM  Result Value Ref Range   WBC 9.9 4.0 - 10.5 K/uL   RBC 3.57 (L) 4.22 - 5.81 MIL/uL   Hemoglobin 10.1 (L) 13.0 - 17.0 g/dL   HCT 29.3 (L) 39.0 - 52.0 %  MCV 82.1 78.0 - 100.0 fL   MCH 28.3 26.0 - 34.0 pg   MCHC 34.5 30.0 - 36.0 g/dL   RDW 12.7 11.5 - 15.5 %   Platelets 303 150 - 400 K/uL   Neutrophils Relative % 60 %   Neutro Abs 6.0 1.7 - 7.7 K/uL   Lymphocytes Relative 23 %   Lymphs Abs 2.3 0.7 - 4.0 K/uL   Monocytes Relative 16 %   Monocytes Absolute 1.6 (H) 0.1 - 1.0 K/uL   Eosinophils Relative 1 %   Eosinophils Absolute 0.1 0.0 - 0.7 K/uL   Basophils Relative 0 %   Basophils Absolute 0.0 0.0 - 0.1 K/uL  I-stat Chem 8, ED     Status: Abnormal   Collection Time: 04/11/16 12:57 AM  Result Value Ref Range   Sodium 136 135 - 145 mmol/L   Potassium 3.8 3.5 - 5.1 mmol/L   Chloride 95 (L) 101 - 111 mmol/L   BUN 10 6 - 20 mg/dL   Creatinine, Ser 0.80 0.61 - 1.24 mg/dL   Glucose, Bld 430 (H) 65 - 99 mg/dL   Calcium, Ion 1.13 (L) 1.15 - 1.40 mmol/L   TCO2 29 0 - 100 mmol/L   Hemoglobin 9.9 (L) 13.0 - 17.0 g/dL   HCT 29.0 (L) 39.0 - 52.0 %  I-Stat CG4 Lactic Acid, ED  (not at  [facility])     Status: Abnormal   Collection Time: 04/11/16  1:02 AM  Result Value Ref Range   Lactic Acid, Venous 2.75 (HH) 0.5 - 1.9 mmol/L   Comment NOTIFIED PHYSICIAN   Urinalysis, Routine w reflex microscopic     Status: Abnormal   Collection Time: 04/11/16  2:27 AM  Result Value Ref Range   Color, Urine YELLOW YELLOW   APPearance CLEAR CLEAR   Specific Gravity, Urine 1.031 (H) 1.005 - 1.030   pH 6.0 5.0 - 8.0   Glucose, UA >=500 (A) NEGATIVE mg/dL     Hgb urine dipstick NEGATIVE NEGATIVE   Bilirubin Urine NEGATIVE NEGATIVE   Ketones, ur NEGATIVE NEGATIVE mg/dL   Protein, ur NEGATIVE NEGATIVE mg/dL   Nitrite NEGATIVE NEGATIVE   Leukocytes, UA NEGATIVE NEGATIVE   RBC / HPF 0-5 0 - 5 RBC/hpf   WBC, UA 0-5 0 - 5 WBC/hpf   Bacteria, UA NONE SEEN NONE SEEN   Squamous Epithelial / LPF 0-5 (A) NONE SEEN   Mucous PRESENT    Hyaline Casts, UA PRESENT   Sedimentation rate     Status: Abnormal   Collection Time: 04/11/16  3:30 AM  Result Value Ref Range   Sed Rate 17 (H) 0 - 16 mm/hr  Lactic acid, plasma     Status: Abnormal   Collection Time: 04/11/16  3:30 AM  Result Value Ref Range   Lactic Acid, Venous 2.1 (HH) 0.5 - 1.9 mmol/L    Comment: CRITICAL RESULT CALLED TO, READ BACK BY AND VERIFIED WITH: L OAKS RN [phone] 04/11/16 A NAVARRO   Procalcitonin     Status: None   Collection Time: 04/11/16  3:30 AM  Result Value Ref Range   Procalcitonin <0.10 ng/mL    Comment:        Interpretation: PCT (Procalcitonin) <= 0.5 ng/mL: Systemic infection (sepsis) is not likely. Local bacterial infection is possible. (NOTE)         ICU PCT Algorithm               Non ICU PCT Algorithm    ----------------------------     ------------------------------  PCT < 0.25 ng/mL                 PCT < 0.1 ng/mL     Stopping of antibiotics            Stopping of antibiotics       strongly encouraged.               strongly encouraged.    ----------------------------     ------------------------------       PCT level decrease by               PCT < 0.25 ng/mL       >= 80% from peak PCT       OR PCT 0.25 - 0.5 ng/mL          Stopping of antibiotics                                             encouraged.     Stopping of antibiotics           encouraged.    ----------------------------     ------------------------------       PCT level decrease by              PCT >= 0.25 ng/mL       < 80% from peak PCT        AND PCT >= 0.5 ng/mL             Continuin g antibiotics                                              encouraged.       Continuing antibiotics            encouraged.    ----------------------------     ------------------------------     PCT level increase compared          PCT > 0.5 ng/mL         with peak PCT AND          PCT >= 0.5 ng/mL             Escalation of antibiotics                                          strongly encouraged.      Escalation of antibiotics        strongly encouraged.   Protime-INR     Status: None   Collection Time: 04/11/16  3:30 AM  Result Value Ref Range   Prothrombin Time 12.9 11.4 - 15.2 seconds   INR 0.97   APTT     Status: None   Collection Time: 04/11/16  3:30 AM  Result Value Ref Range   aPTT 28 24 - 36 seconds  I-Stat CG4 Lactic Acid, ED  (not at  [facility])     Status: Abnormal   Collection Time: 04/11/16  3:43 AM  Result Value Ref Range   Lactic Acid, Venous 2.19 (HH) 0.5 - 1.9 mmol/L   Comment NOTIFIED PHYSICIAN     Ct Pelvis W Contrast  Result Date: 04/11/2016 CLINICAL DATA:  Bloody drainage from site of prior surgery for sacral abscess. Initial encounter. EXAM: CT PELVIS WITH CONTRAST TECHNIQUE: Multidetector CT imaging of the pelvis was performed using the standard protocol following the bolus administration of intravenous contrast. CONTRAST:  100 mL of Isovue 300 IV contrast COMPARISON:  CT of the pelvis performed 02/22/2016 FINDINGS: Urinary Tract: The bladder is mildly distended and grossly unremarkable. Bowel: Visualized small and large bowel loops are grossly unremarkable in appearance. Vascular/Lymphatic: The visualized vasculature is grossly unremarkable in appearance. No retroperitoneal or pelvic sidewall lymphadenopathy is seen. No inguinal lymphadenopathy is appreciated. Reproductive: The prostate remains normal in size. The scrotum is unremarkable in appearance. Other: There is a surgical defect extending nearly to the level of the coccyx, with air again noted  tracking superiorly from this location just superficial to the paraspinal musculature, and new associated soft tissue thickening, reflecting chronic inflammation. At the site of the prior large collection of air along the medial right buttocks, a smaller collection of air and trace fluid is now seen, measuring approximately 7.5 cm, with associated new soft tissue inflammation. More prominent overlying skin thickening is also seen. Musculoskeletal: The visualized musculature is grossly unremarkable. No acute osseous abnormalities are seen. Evaluation for osteomyelitis is limited on CT. IMPRESSION: 1. Surgical defect extending nearly to the level of the coccyx, with air tracking superiorly from this location just superficial to the paraspinal musculature, and new associated soft tissue thickening, reflecting chronic inflammation. Persistent infection cannot be excluded. 2. At the site of the prior large collection of air along the medial right buttock, a smaller collection of air and trace fluid is now seen, measuring approximately 7.5 cm, with new surrounding soft tissue inflammation and thickening, concerning for evolving abscess. More prominent overlying skin thickening noted. These results were called by telephone at the time of interpretation on 04/11/2016 at 1:28 am to Dr. Thayer Jew, who verbally acknowledged these results. Electronically Signed   By: Garald Balding M.D.   On: 04/11/2016 01:31    Review of Systems  Constitutional: Positive for fever and weight loss.  HENT: Negative.   Eyes: Positive for blurred vision.  Respiratory: Negative.   Cardiovascular: Negative.   Gastrointestinal: Negative.   Genitourinary: Negative.   Musculoskeletal: Negative.   Skin:       See picture below.  Open area mid sacrum 5 cm diameter and 4 cm deep with malodorous purulent fluid coming from it.  Above this and to the right is a 10 cm diameter raided area, with some erythema, that is fluctuant.  He also has  skin changes consistent with high glucose.  Neurological: Negative.   Psychiatric/Behavioral: Negative.    Blood pressure 126/83, pulse 103, temperature 100.3 F (37.9 C), temperature source Oral, resp. rate 18, height '5\' 7"'  (1.702 m), weight 95.3 kg (210 lb), SpO2 100 %. Physical Exam  Constitutional: He appears well-developed and well-nourished. No distress.  He reports 10 lb weight loss since 02/2016 admit.    HENT:  Head: Normocephalic and atraumatic.  Eyes: Right eye exhibits no discharge. Left eye exhibits no discharge. No scleral icterus.  Neck: Normal range of motion. Neck supple. No JVD present. No tracheal deviation present. No thyromegaly present.  Cardiovascular: Normal rate, regular rhythm, normal heart sounds and intact distal pulses.   No murmur heard. Respiratory: Effort normal and breath sounds normal. No respiratory distress. He has no wheezes. He has no rales. He exhibits no tenderness.  GI: Soft. Bowel sounds are normal. He exhibits no distension  and no mass. There is no tenderness. There is no rebound and no guarding.  Genitourinary:  Genitourinary Comments: See picture below.  Open area mid sacrum 5 cm diameter and 4 cm deep with malodorous purulent fluid coming from it.  Above this and to the right is a 10 cm diameter raided area, with some erythema, that is fluctuant.    Musculoskeletal: He exhibits no edema or tenderness.  Lymphadenopathy:    He has no cervical adenopathy.  Neurological: He is alert. No cranial nerve deficit.  Skin: Skin is warm and dry. He is not diaphoretic.  Skin changes consistent with high glucose.  See picture of rectum and skin changes below.  Psychiatric: He has a normal mood and affect. His behavior is normal. Judgment and thought content normal.    Left                                                      Right  Area over the right is about 10 cm in diameter   This is the wound after debridement and on hydrotherapy  02/28/16  Assessment/Plan: Open rectal wound, with purulent drainage and new area of fluid, air collection right buttocks Uncontrolled AODM Medical non compliance with inadequate access to care Hypertension FEN:  IV fluids/NPO VH:[id number] 04/11/16 =>> day 1 DVT:  SCD  Plan.  I am making him NPO and setting him up for Exam under anesthesia, Incision and drainage of right buttocks abscess.  Culture is being sent also.  Shiree Altemus 04/11/2016, 7:32 AM

## 2016-04-11 NOTE — Anesthesia Preprocedure Evaluation (Signed)
Anesthesia Evaluation  Patient identified by MRN, date of birth, ID band Patient awake    Reviewed: Allergy & Precautions, NPO status , Patient's Chart, lab work & pertinent test results  Airway Mallampati: I  TM Distance: >3 FB Neck ROM: Full    Dental no notable dental hx. (+) Teeth Intact   Pulmonary asthma ,    Pulmonary exam normal breath sounds clear to auscultation       Cardiovascular hypertension, Pt. on medications Normal cardiovascular exam Rhythm:Regular Rate:Normal     Neuro/Psych negative neurological ROS  negative psych ROS   GI/Hepatic Neg liver ROS, Rectal abscess   Endo/Other  diabetes, Poorly Controlled, Type 2, Insulin DependentObesity  Renal/GU ARFRenal disease  negative genitourinary   Musculoskeletal negative musculoskeletal ROS (+)   Abdominal (+) + obese,   Peds  Hematology  (+) anemia ,   Anesthesia Other Findings   Reproductive/Obstetrics                               Chemistry      Component Value Date/Time   NA 136 04/11/2016 0057   K 3.8 04/11/2016 0057   CL 95 (L) 04/11/2016 0057   CO2 25 04/11/2016 0045   BUN 10 04/11/2016 0057   CREATININE 0.80 04/11/2016 0057   CREATININE 0.73 04/10/2016 1125      Component Value Date/Time   CALCIUM 8.8 (L) 04/11/2016 0045   ALKPHOS 97 04/11/2016 0045   AST 18 04/11/2016 0045   ALT 10 (L) 04/11/2016 0045   BILITOT 0.3 04/11/2016 0045     Lab Results  Component Value Date   WBC 9.9 04/11/2016   HGB 9.9 (L) 04/11/2016   HCT 29.0 (L) 04/11/2016   MCV 82.1 04/11/2016   PLT 303 04/11/2016    Anesthesia Physical Anesthesia Plan  ASA: III and emergent  Anesthesia Plan: General   Post-op Pain Management:    Induction: Intravenous, Rapid sequence and Cricoid pressure planned  Airway Management Planned: Oral ETT  Additional Equipment:   Intra-op Plan:   Post-operative Plan: Extubation in  OR  Informed Consent: I have reviewed the patients History and Physical, chart, labs and discussed the procedure including the risks, benefits and alternatives for the proposed anesthesia with the patient or authorized representative who has indicated his/her understanding and acceptance.   Dental advisory given  Plan Discussed with: Anesthesiologist, CRNA and Surgeon  Anesthesia Plan Comments:         Anesthesia Quick Evaluation

## 2016-04-11 NOTE — Telephone Encounter (Signed)
Contacted pt to go over lab results pt didn't answer lvm regarding results on vm and if he has any questions to give me a call

## 2016-04-11 NOTE — Telephone Encounter (Signed)
Contacted pt to go over lab results

## 2016-04-11 NOTE — Progress Notes (Signed)
Pharmacy Antibiotic Note  [name] [name] is a 31 y.o. male admitted on 04/10/2016 with sepsis and cellulitis.  Pharmacy has been consulted for Vancomycin and Zosyn dosing.  Plan: Vancomycin 1gm IV every 8 hours.  Goal trough 15-20 mcg/mL. Zosyn 3.375g IV q8h (4 hour infusion).  Height: 5\' 7"  (170.2 cm) Weight: 210 lb (95.3 kg) IBW/kg (Calculated) : 66.1  Temp (24hrs), Avg:99.5 F (37.5 C), Min:98.6 F (37 C), Max:100.3 F (37.9 C)   Recent Labs Lab 04/10/16 1125 04/11/16 0045 04/11/16 0057 04/11/16 0102  WBC 8.0 9.9  --   --   CREATININE 0.73 0.93 0.80  --   LATICACIDVEN  --   --   --  2.75*    Estimated Creatinine Clearance: 149.9 mL/min (by C-G formula based on SCr of 0.8 mg/dL).    No Known Allergies  Antimicrobials this admission: Vancomycin 04/11/2016 >> Zosyn 04/11/2016 >>   Dose adjustments this admission: -  Microbiology results: pending  Thank you for allowing pharmacy to be a part of this patient's care.  Nani Skillern Crowford 04/11/2016 3:17 AM

## 2016-04-11 NOTE — Consult Note (Signed)
Reason for Consult:  Sacral Decubitus Referring Physician: Josias Tomerlin is an 31 y.o. male.  HPI: Pt with multiple medical issues presents with right buttocks abscess, fever and lactic acidosis.  He was hospitalized from 12/6-12/13/2017, with sepsis, uncontrolled diabetes, DKA, Acute renal failure.  He underwent sacral wound debridement on 02/22/16 by Dr. Georganna Skeans, and then had local wound care, with hydrotherapy, and plans to follow up in the Wound clinic. Dr. Hulen Skains even considered a possible evaluation by Plastic's. He has been seen by Dr. Leanne Chang in the interim but has been non compliant with medications because he cannot afford them. He was seen in the Health and Wellness clinic yesterday and sent to the ED.  He was to be seen at [facility] in December, but could not get to the site at the scheduled time.  Next appointment is 04/18/16.    Work up in the ED shows a low grad fever 100.3, tachycardia, some hypertension, glucoses up in the 300 -430 range, lactate 2.75, WBC is normal.  CT of the pelvis this AM: . 1. Surgical defect extending nearly to the level of the coccyx, with air tracking superiorly from this location just superficial to the paraspinal musculature, and new associated soft tissue thickening, reflecting chronic inflammation. Persistent infection cannot be excluded. 2. At the site of the prior large collection of air along the medial right buttock, a smaller collection of air and trace fluid is now seen, measuring approximately 7.5 cm, with new surrounding soft tissue inflammation and thickening, concerning for evolving abscess. More prominent overlying skin thickening noted.  We are ask to see. He is in the Ed, with draining purulent material from open site.  There is a new site superior and to the right of the open sacral area that had been treated in December.This area is about 10 cm in diameter, raised and erythematous, fluctuant, and he says when he presses  it it sounds like a fart.  See picture below.   . sodium chloride 125 mL/hr at 04/11/16 0328  . piperacillin-tazobactam (ZOSYN)  IV Stopped (04/11/16 0657)  . vancomycin 1,000 mg (04/11/16 0654)   . insulin aspart  0-9 Units Subcutaneous TID WC  . insulin aspart protamine- aspart  40 Units Subcutaneous BID WC  . sodium chloride flush  3 mL Intravenous Q12H     Past Medical History:  Diagnosis Date  . Asthma   . Diabetes mellitus without complication ([place])   . Hypertension     Past Surgical History:  Procedure Laterality Date  . HERNIA REPAIR     x2 when he was a baby  . INCISION AND DRAINAGE ABSCESS Right 09/03/2015   Procedure: INCISION AND DRAINAGE ABSCESS;  Surgeon: Roseanne Kaufman, MD;  Location: [facility] ORS;  Service: Orthopedics;  Laterality: Right;  . IRRIGATION AND DEBRIDEMENT ABSCESS Left 08/31/2015   Procedure: IRRIGATION AND DEBRIDEMENT ABSCESS LEFT SHOULDER;  Surgeon: Alphonsa Overall, MD;  Location: [facility];  Service: General;  Laterality: Left;  . WOUND DEBRIDEMENT N/A 02/24/2016   Procedure: SACRAL WOUND DEBRIDEMENT;  Surgeon: Georganna Skeans, MD;  Location: [facility] OR;  Service: General;  Laterality: N/A;    Family History  Problem Relation Age of Onset  . Diabetes type II Mother   . Diabetes type II Father     Social History:  reports that he has never smoked. He has never used smokeless tobacco. He reports that he uses drugs, including Marijuana. He reports that he does not drink alcohol.  Allergies: No Known Allergies  Prior to Admission medications   Medication Sig Start Date End Date Taking? Authorizing Provider  acetaminophen (TYLENOL) 500 MG tablet Take 1,000 mg by mouth every 6 (six) hours as needed (for pain).   Yes Historical Provider, MD  insulin aspart protamine- aspart (NOVOLOG MIX 70/30) (70-30) 100 UNIT/ML injection Inject 0.4 mLs (40 Units total) into the skin 2 (two) times daily with a meal. 04/10/16  Yes Maren Reamer, MD  Blood Glucose Monitoring Suppl  (TRUE METRIX GO GLUCOSE METER) w/Device KIT 1 each by Does not apply route every 8 (eight) hours as needed. 04/10/16   Maren Reamer, MD  glucose blood test strip Use as instructed 04/10/16   Maren Reamer, MD  HYDROcodone-acetaminophen (NORCO/VICODIN) 5-325 MG tablet Take 1 tablet by mouth every 6 (six) hours as needed. Patient not taking: Reported on 04/10/2016 02/16/16   Shawn C Joy, PA-C  Insulin Syringe 27G X 1/2" 1 ML MISC 1 application by Does not apply route 2 (two) times daily. 04/10/16   Maren Reamer, MD  naproxen (NAPROSYN) 500 MG tablet Take 1 tablet (500 mg total) by mouth 2 (two) times daily. Patient not taking: Reported on 04/10/2016 02/16/16   Shawn C Joy, PA-C  oxyCODONE (OXY IR/ROXICODONE) 5 MG immediate release tablet Take 1 tablet (5 mg total) by mouth every 6 (six) hours as needed for moderate pain or severe pain. Patient not taking: Reported on 04/10/2016 02/29/16   Venetia Maxon Rama, MD  TRUEPLUS LANCETS 26G MISC 1 each by Does not apply route every 8 (eight) hours as needed. 04/10/16   Maren Reamer, MD     Results for orders placed or performed during the hospital encounter of 04/10/16 (from the past 48 hour(s))  CBG monitoring, ED     Status: Abnormal   Collection Time: 04/10/16 10:55 PM  Result Value Ref Range   Glucose-Capillary 374 (H) 65 - 99 mg/dL  Comprehensive metabolic panel     Status: Abnormal   Collection Time: 04/11/16 12:45 AM  Result Value Ref Range   Sodium 133 (L) 135 - 145 mmol/L   Potassium 3.7 3.5 - 5.1 mmol/L   Chloride 98 (L) 101 - 111 mmol/L   CO2 25 22 - 32 mmol/L   Glucose, Bld 418 (H) 65 - 99 mg/dL   BUN 11 6 - 20 mg/dL   Creatinine, Ser 0.93 0.61 - 1.24 mg/dL   Calcium 8.8 (L) 8.9 - 10.3 mg/dL   Total Protein 6.4 (L) 6.5 - 8.1 g/dL   Albumin 3.1 (L) 3.5 - 5.0 g/dL   AST 18 15 - 41 U/L   ALT 10 (L) 17 - 63 U/L   Alkaline Phosphatase 97 38 - 126 U/L   Total Bilirubin 0.3 0.3 - 1.2 mg/dL   GFR calc non Af Amer >60 >60 mL/min    GFR calc Af Amer >60 >60 mL/min    Comment: (NOTE) The eGFR has been calculated using the CKD EPI equation. This calculation has not been validated in all clinical situations. eGFR's persistently <60 mL/min signify possible Chronic Kidney Disease.    Anion gap 10 5 - 15  CBC WITH DIFFERENTIAL     Status: Abnormal   Collection Time: 04/11/16 12:45 AM  Result Value Ref Range   WBC 9.9 4.0 - 10.5 K/uL   RBC 3.57 (L) 4.22 - 5.81 MIL/uL   Hemoglobin 10.1 (L) 13.0 - 17.0 g/dL   HCT 29.3 (L) 39.0 - 52.0 %  MCV 82.1 78.0 - 100.0 fL   MCH 28.3 26.0 - 34.0 pg   MCHC 34.5 30.0 - 36.0 g/dL   RDW 12.7 11.5 - 15.5 %   Platelets 303 150 - 400 K/uL   Neutrophils Relative % 60 %   Neutro Abs 6.0 1.7 - 7.7 K/uL   Lymphocytes Relative 23 %   Lymphs Abs 2.3 0.7 - 4.0 K/uL   Monocytes Relative 16 %   Monocytes Absolute 1.6 (H) 0.1 - 1.0 K/uL   Eosinophils Relative 1 %   Eosinophils Absolute 0.1 0.0 - 0.7 K/uL   Basophils Relative 0 %   Basophils Absolute 0.0 0.0 - 0.1 K/uL  I-stat Chem 8, ED     Status: Abnormal   Collection Time: 04/11/16 12:57 AM  Result Value Ref Range   Sodium 136 135 - 145 mmol/L   Potassium 3.8 3.5 - 5.1 mmol/L   Chloride 95 (L) 101 - 111 mmol/L   BUN 10 6 - 20 mg/dL   Creatinine, Ser 0.80 0.61 - 1.24 mg/dL   Glucose, Bld 430 (H) 65 - 99 mg/dL   Calcium, Ion 1.13 (L) 1.15 - 1.40 mmol/L   TCO2 29 0 - 100 mmol/L   Hemoglobin 9.9 (L) 13.0 - 17.0 g/dL   HCT 29.0 (L) 39.0 - 52.0 %  I-Stat CG4 Lactic Acid, ED  (not at  [facility])     Status: Abnormal   Collection Time: 04/11/16  1:02 AM  Result Value Ref Range   Lactic Acid, Venous 2.75 (HH) 0.5 - 1.9 mmol/L   Comment NOTIFIED PHYSICIAN   Urinalysis, Routine w reflex microscopic     Status: Abnormal   Collection Time: 04/11/16  2:27 AM  Result Value Ref Range   Color, Urine YELLOW YELLOW   APPearance CLEAR CLEAR   Specific Gravity, Urine 1.031 (H) 1.005 - 1.030   pH 6.0 5.0 - 8.0   Glucose, UA >=500 (A) NEGATIVE mg/dL     Hgb urine dipstick NEGATIVE NEGATIVE   Bilirubin Urine NEGATIVE NEGATIVE   Ketones, ur NEGATIVE NEGATIVE mg/dL   Protein, ur NEGATIVE NEGATIVE mg/dL   Nitrite NEGATIVE NEGATIVE   Leukocytes, UA NEGATIVE NEGATIVE   RBC / HPF 0-5 0 - 5 RBC/hpf   WBC, UA 0-5 0 - 5 WBC/hpf   Bacteria, UA NONE SEEN NONE SEEN   Squamous Epithelial / LPF 0-5 (A) NONE SEEN   Mucous PRESENT    Hyaline Casts, UA PRESENT   Sedimentation rate     Status: Abnormal   Collection Time: 04/11/16  3:30 AM  Result Value Ref Range   Sed Rate 17 (H) 0 - 16 mm/hr  Lactic acid, plasma     Status: Abnormal   Collection Time: 04/11/16  3:30 AM  Result Value Ref Range   Lactic Acid, Venous 2.1 (HH) 0.5 - 1.9 mmol/L    Comment: CRITICAL RESULT CALLED TO, READ BACK BY AND VERIFIED WITH: L OAKS RN [phone] 04/11/16 A NAVARRO   Procalcitonin     Status: None   Collection Time: 04/11/16  3:30 AM  Result Value Ref Range   Procalcitonin <0.10 ng/mL    Comment:        Interpretation: PCT (Procalcitonin) <= 0.5 ng/mL: Systemic infection (sepsis) is not likely. Local bacterial infection is possible. (NOTE)         ICU PCT Algorithm               Non ICU PCT Algorithm    ----------------------------     ------------------------------  PCT < 0.25 ng/mL                 PCT < 0.1 ng/mL     Stopping of antibiotics            Stopping of antibiotics       strongly encouraged.               strongly encouraged.    ----------------------------     ------------------------------       PCT level decrease by               PCT < 0.25 ng/mL       >= 80% from peak PCT       OR PCT 0.25 - 0.5 ng/mL          Stopping of antibiotics                                             encouraged.     Stopping of antibiotics           encouraged.    ----------------------------     ------------------------------       PCT level decrease by              PCT >= 0.25 ng/mL       < 80% from peak PCT        AND PCT >= 0.5 ng/mL             Continuin g antibiotics                                              encouraged.       Continuing antibiotics            encouraged.    ----------------------------     ------------------------------     PCT level increase compared          PCT > 0.5 ng/mL         with peak PCT AND          PCT >= 0.5 ng/mL             Escalation of antibiotics                                          strongly encouraged.      Escalation of antibiotics        strongly encouraged.   Protime-INR     Status: None   Collection Time: 04/11/16  3:30 AM  Result Value Ref Range   Prothrombin Time 12.9 11.4 - 15.2 seconds   INR 0.97   APTT     Status: None   Collection Time: 04/11/16  3:30 AM  Result Value Ref Range   aPTT 28 24 - 36 seconds  I-Stat CG4 Lactic Acid, ED  (not at  [facility])     Status: Abnormal   Collection Time: 04/11/16  3:43 AM  Result Value Ref Range   Lactic Acid, Venous 2.19 (HH) 0.5 - 1.9 mmol/L   Comment NOTIFIED PHYSICIAN     Ct Pelvis W Contrast  Result Date: 04/11/2016 CLINICAL DATA:  Bloody drainage from site of prior surgery for sacral abscess. Initial encounter. EXAM: CT PELVIS WITH CONTRAST TECHNIQUE: Multidetector CT imaging of the pelvis was performed using the standard protocol following the bolus administration of intravenous contrast. CONTRAST:  100 mL of Isovue 300 IV contrast COMPARISON:  CT of the pelvis performed 02/22/2016 FINDINGS: Urinary Tract: The bladder is mildly distended and grossly unremarkable. Bowel: Visualized small and large bowel loops are grossly unremarkable in appearance. Vascular/Lymphatic: The visualized vasculature is grossly unremarkable in appearance. No retroperitoneal or pelvic sidewall lymphadenopathy is seen. No inguinal lymphadenopathy is appreciated. Reproductive: The prostate remains normal in size. The scrotum is unremarkable in appearance. Other: There is a surgical defect extending nearly to the level of the coccyx, with air again noted  tracking superiorly from this location just superficial to the paraspinal musculature, and new associated soft tissue thickening, reflecting chronic inflammation. At the site of the prior large collection of air along the medial right buttocks, a smaller collection of air and trace fluid is now seen, measuring approximately 7.5 cm, with associated new soft tissue inflammation. More prominent overlying skin thickening is also seen. Musculoskeletal: The visualized musculature is grossly unremarkable. No acute osseous abnormalities are seen. Evaluation for osteomyelitis is limited on CT. IMPRESSION: 1. Surgical defect extending nearly to the level of the coccyx, with air tracking superiorly from this location just superficial to the paraspinal musculature, and new associated soft tissue thickening, reflecting chronic inflammation. Persistent infection cannot be excluded. 2. At the site of the prior large collection of air along the medial right buttock, a smaller collection of air and trace fluid is now seen, measuring approximately 7.5 cm, with new surrounding soft tissue inflammation and thickening, concerning for evolving abscess. More prominent overlying skin thickening noted. These results were called by telephone at the time of interpretation on 04/11/2016 at 1:28 am to Dr. Thayer Jew, who verbally acknowledged these results. Electronically Signed   By: Garald Balding M.D.   On: 04/11/2016 01:31    Review of Systems  Constitutional: Positive for fever and weight loss.  HENT: Negative.   Eyes: Positive for blurred vision.  Respiratory: Negative.   Cardiovascular: Negative.   Gastrointestinal: Negative.   Genitourinary: Negative.   Musculoskeletal: Negative.   Skin:       See picture below.  Open area mid sacrum 5 cm diameter and 4 cm deep with malodorous purulent fluid coming from it.  Above this and to the right is a 10 cm diameter raided area, with some erythema, that is fluctuant.  He also has  skin changes consistent with high glucose.  Neurological: Negative.   Psychiatric/Behavioral: Negative.    Blood pressure 126/83, pulse 103, temperature 100.3 F (37.9 C), temperature source Oral, resp. rate 18, height '5\' 7"'  (1.702 m), weight 95.3 kg (210 lb), SpO2 100 %. Physical Exam  Constitutional: He appears well-developed and well-nourished. No distress.  He reports 10 lb weight loss since 02/2016 admit.    HENT:  Head: Normocephalic and atraumatic.  Eyes: Right eye exhibits no discharge. Left eye exhibits no discharge. No scleral icterus.  Neck: Normal range of motion. Neck supple. No JVD present. No tracheal deviation present. No thyromegaly present.  Cardiovascular: Normal rate, regular rhythm, normal heart sounds and intact distal pulses.   No murmur heard. Respiratory: Effort normal and breath sounds normal. No respiratory distress. He has no wheezes. He has no rales. He exhibits no tenderness.  GI: Soft. Bowel sounds are normal. He exhibits no distension  and no mass. There is no tenderness. There is no rebound and no guarding.  Genitourinary:  Genitourinary Comments: See picture below.  Open area mid sacrum 5 cm diameter and 4 cm deep with malodorous purulent fluid coming from it.  Above this and to the right is a 10 cm diameter raided area, with some erythema, that is fluctuant.    Musculoskeletal: He exhibits no edema or tenderness.  Lymphadenopathy:    He has no cervical adenopathy.  Neurological: He is alert. No cranial nerve deficit.  Skin: Skin is warm and dry. He is not diaphoretic.  Skin changes consistent with high glucose.  See picture of rectum and skin changes below.  Psychiatric: He has a normal mood and affect. His behavior is normal. Judgment and thought content normal.    Left                                                      Right  Area over the right is about 10 cm in diameter   This is the wound after debridement and on hydrotherapy  02/28/16  Assessment/Plan: Open rectal wound, with purulent drainage and new area of fluid, air collection right buttocks Uncontrolled AODM Medical non compliance with inadequate access to care Hypertension FEN:  IV fluids/NPO YB:FXOVANVBTY/OMAYO 04/11/16 =>> day 1 DVT:  SCD  Plan.  I am making him NPO and setting him up for Exam under anesthesia, Incision and drainage of right buttocks abscess.  Culture is being sent also.  Khamora Karan 04/11/2016, 7:32 AM

## 2016-04-11 NOTE — Interval H&P Note (Signed)
History and Physical Interval Note:  04/11/2016 11:11 AM  [name] [name]  has presented today for surgery, with the diagnosis of rectal abcess  The various methods of treatment have been discussed with the patient and family. After consideration of risks, benefits and other options for treatment, the patient has consented to  Procedure(s): IRRIGATION AND DEBRIDEMENT RECTAL ABSCESS (N/A) as a surgical intervention .  The patient's history has been reviewed, patient examined, no change in status, stable for surgery.  I have reviewed the patient's chart and labs.  Questions were answered to the patient's satisfaction.     Krystyl Cannell B

## 2016-04-11 NOTE — ED Notes (Signed)
Report given to OR RN.

## 2016-04-11 NOTE — ED Notes (Signed)
Tech picked patient up for the OR

## 2016-04-11 NOTE — Transfer of Care (Signed)
Immediate Anesthesia Transfer of Care Note  Patient: [name] [name]  Procedure(s) Performed: Procedure(s): IRRIGATION AND DEBRIDEMENT RECTAL ABSCESS (N/A)  Patient Location: PACU  Anesthesia Type:General  Level of Consciousness: sedated  Airway & Oxygen Therapy: Patient Spontanous Breathing and Patient connected to face mask oxygen  Post-op Assessment: Report given to RN and Post -op Vital signs reviewed and stable  Post vital signs: Reviewed and stable  Last Vitals:  Vitals:   04/11/16 0650 04/11/16 0700  BP: 126/83 121/80  Pulse: 103 103  Resp: 18   Temp:      Last Pain:  Vitals:   04/11/16 1059  TempSrc:   PainSc: 2       Patients Stated Pain Goal: 6 (57/97/28 2060)  Complications: No apparent anesthesia complications

## 2016-04-11 NOTE — ED Notes (Signed)
He is aware we need a urine specimen

## 2016-04-12 ENCOUNTER — Inpatient Hospital Stay (HOSPITAL_COMMUNITY): Payer: Self-pay

## 2016-04-12 DIAGNOSIS — K651 Peritoneal abscess: Secondary | ICD-10-CM | POA: Diagnosis present

## 2016-04-12 LAB — URINE CULTURE: CULTURE: NO GROWTH

## 2016-04-12 LAB — VANCOMYCIN, TROUGH: VANCOMYCIN TR: 17 ug/mL (ref 15–20)

## 2016-04-12 LAB — GLUCOSE, CAPILLARY
GLUCOSE-CAPILLARY: 257 mg/dL — AB (ref 65–99)
GLUCOSE-CAPILLARY: 267 mg/dL — AB (ref 65–99)
GLUCOSE-CAPILLARY: 256 mg/dL — AB (ref 65–99)
Glucose-Capillary: 213 mg/dL — ABNORMAL HIGH (ref 65–99)

## 2016-04-12 MED ORDER — ENOXAPARIN SODIUM 40 MG/0.4ML ~~LOC~~ SOLN
40.0000 mg | SUBCUTANEOUS | Status: DC
  Administered 2016-04-12 – 2016-04-16 (×4): 40 mg via SUBCUTANEOUS
  Filled 2016-04-12 (×4): qty 0.4

## 2016-04-12 MED ORDER — POTASSIUM CHLORIDE CRYS ER 20 MEQ PO TBCR
40.0000 meq | EXTENDED_RELEASE_TABLET | Freq: Two times a day (BID) | ORAL | Status: DC
  Administered 2016-04-12 – 2016-04-13 (×2): 40 meq via ORAL
  Filled 2016-04-12 (×3): qty 2

## 2016-04-12 MED ORDER — DICLOFENAC SODIUM 1 % TD GEL
4.0000 g | Freq: Four times a day (QID) | TRANSDERMAL | Status: DC
  Administered 2016-04-12 – 2016-04-17 (×15): 4 g via TOPICAL
  Filled 2016-04-12: qty 100

## 2016-04-12 MED ORDER — POTASSIUM CHLORIDE CRYS ER 20 MEQ PO TBCR: 40.0000 meq | EXTENDED_RELEASE_TABLET | ORAL | Status: DC

## 2016-04-12 MED ORDER — MUSCLE RUB 10-15 % EX CREA
TOPICAL_CREAM | CUTANEOUS | Status: DC | PRN
  Administered 2016-04-12 (×2): 1 via TOPICAL
  Administered 2016-04-16: 11:00:00 via TOPICAL
  Filled 2016-04-12: qty 85

## 2016-04-12 MED ORDER — INSULIN ASPART 100 UNIT/ML ~~LOC~~ SOLN
0.0000 [IU] | Freq: Three times a day (TID) | SUBCUTANEOUS | Status: DC
  Administered 2016-04-13: 8 [IU] via SUBCUTANEOUS

## 2016-04-12 MED ORDER — INSULIN ASPART 100 UNIT/ML ~~LOC~~ SOLN
0.0000 [IU] | Freq: Three times a day (TID) | SUBCUTANEOUS | Status: DC
  Administered 2016-04-12 (×2): 5 [IU] via SUBCUTANEOUS

## 2016-04-12 MED ORDER — INSULIN ASPART PROT & ASPART (70-30 MIX) 100 UNIT/ML ~~LOC~~ SUSP
20.0000 [IU] | Freq: Two times a day (BID) | SUBCUTANEOUS | Status: DC
  Administered 2016-04-12: 20 [IU] via SUBCUTANEOUS
  Filled 2016-04-12: qty 10

## 2016-04-12 MED ORDER — INSULIN ASPART PROT & ASPART (70-30 MIX) 100 UNIT/ML ~~LOC~~ SUSP
30.0000 [IU] | Freq: Two times a day (BID) | SUBCUTANEOUS | Status: DC
  Administered 2016-04-14 – 2016-04-15 (×3): 30 [IU] via SUBCUTANEOUS

## 2016-04-12 MED ORDER — IBUPROFEN 200 MG PO TABS
600.0000 mg | ORAL_TABLET | Freq: Once | ORAL | Status: AC
  Administered 2016-04-12: 600 mg via ORAL
  Filled 2016-04-12: qty 3

## 2016-04-12 MED ORDER — CYCLOBENZAPRINE HCL 10 MG PO TABS
10.0000 mg | ORAL_TABLET | Freq: Every day | ORAL | Status: DC
  Administered 2016-04-12 – 2016-04-16 (×5): 10 mg via ORAL
  Filled 2016-04-12 (×5): qty 1

## 2016-04-12 NOTE — Progress Notes (Signed)
Pharmacy Antibiotic Note  [name] [name] is a 31 y.o. male admitted on 04/10/2016 with sepsis and cellulitis.  I&D of abscess and debridement of dead fascia and placement of VAC by surgery on 1/24. Pharmacy has been consulted for Vancomycin and Zosyn dosing.  Vancomycin trough therapeutic at 17 mcg/ml  Plan: Continue Vancomycin 1g IV q8h. No adjustments to Zosyn 3.375g IV q8h (4 hour infusion time).  F/u SCr, levels as needed.  Height: 5\' 7"  (170.2 cm) Weight: 210 lb (95.3 kg) IBW/kg (Calculated) : 66.1  Temp (24hrs), Avg:98.2 F (36.8 C), Min:98 F (36.7 C), Max:98.4 F (36.9 C)   Recent Labs Lab 04/10/16 1125 04/11/16 0045 04/11/16 0057 04/11/16 0102 04/11/16 0330 04/11/16 0343 04/11/16 1604 04/12/16 2104  WBC 8.0 9.9  --   --   --   --  9.6  --   CREATININE 0.73 0.93 0.80  --   --   --  0.63  --   LATICACIDVEN  --   --   --  2.75* 2.1* 2.19* 1.2  --   VANCOTROUGH  --   --   --   --   --   --   --  17    Estimated Creatinine Clearance: 149.9 mL/min (by C-G formula based on SCr of 0.63 mg/dL).    No Known Allergies  Antimicrobials this admission: Vancomycin 04/12/2016 >> Zosyn 04/12/2016 >>   Dose adjustments this admission: 1/25 VT 2100 17 on 1g q8h  Microbiology results: 1/24 urine: ngf 1/24 abcess: gram neg rods, gram positive cocci 1/24 MRSA: neg  Thank you for allowing pharmacy to be a part of this patient's care.  Hershal Coria, PharmD, BCPS Pager: [phone] 04/12/2016 10:07 PM

## 2016-04-12 NOTE — Progress Notes (Signed)
Pharmacy Antibiotic Note  [name] [name] is a 31 y.o. male admitted on 04/10/2016 with sepsis and cellulitis.  I&D of abscess and debridement of dead fascia and placement of VAC by surgery on 1/24. Pharmacy has been consulted for Vancomycin and Zosyn dosing.  Plan: Vancomycin 1gm IV every 8 hours.  Goal trough 15-20 mcg/mL. Zosyn 3.375g IV q8h (4 hour infusion).  Check vanc trough tonight prior to 6th dose  Height: 5\' 7"  (170.2 cm) Weight: 210 lb (95.3 kg) IBW/kg (Calculated) : 66.1  Temp (24hrs), Avg:98.2 F (36.8 C), Min:97.6 F (36.4 C), Max:99 F (37.2 C)   Recent Labs Lab 04/10/16 1125 04/11/16 0045 04/11/16 0057 04/11/16 0102 04/11/16 0330 04/11/16 0343 04/11/16 1604  WBC 8.0 9.9  --   --   --   --  9.6  CREATININE 0.73 0.93 0.80  --   --   --  0.63  LATICACIDVEN  --   --   --  2.75* 2.1* 2.19* 1.2    Estimated Creatinine Clearance: 149.9 mL/min (by C-G formula based on SCr of 0.63 mg/dL).    No Known Allergies  Antimicrobials this admission: Vancomycin 04/12/2016 >> Zosyn 04/12/2016 >>   Dose adjustments this admission: 1/25 VT 2100 _____  Microbiology results: 1/24 urine: ngf 1/24 abcess: gram neg rods, gram positive cocci 1/24 MRSA: neg  Thank you for allowing pharmacy to be a part of this patient's care.  Dolly Rias RPh 04/12/2016, 11:32 AM Pager [phone]

## 2016-04-12 NOTE — Progress Notes (Signed)
PROGRESS NOTE    [name] [name]  OVZ:[id number] DOB: Nov 10, 1985 DOA: 04/10/2016 PCP: Maren Reamer, MD   Brief Narrative: 31 y.o.malewith history of poorly controlled diabetes, HTN, and asthma who presented with concern for a wound infection.  He iss/p sacral abscess excision on 02/24/2016. He completed antibiotic treatment and was supposed to have a follow up appt w/wound care for a wound vac placement but he was unable to keep this appointment. He presented to the ED c/o worsening pain over the sacral wound area w/ bloody drainage.  CT abdomen/pelvis in the ED showed chronic inflammation with likely a new evolving abscess over the right buttock.  Pt had I&D with Vac placement- 1/24  Subjective- Complaints of neck pain, present only when he wants to sleep- night and morning, when he wakes up. No radicular symptoms, no weakness.  Assessment & Plan:   Principal Problem:   Pelvic abscess in male [facility]) Active Problems:   Uncontrolled diabetes mellitus ([place])   Sepsis, unspecified organism ([place])   Wound infection   Asthma  Recurrent pelvic abscess- Wound vac placed with I and D- 1/24. No fevers, No wbc. - Cont broad spectrum- Vanc and zosyn - F/u culture results from I and D - PT eval, an Pt needs, and recommendations on discharge- No PT needs - Appreciate surg eval, Or tomorrow for dressing change - On IVF, 75cc/hr, D/c after sugery, lactic acidosis has resolved.  Uncontrolled DM2- No HgbA1c on file - Check Hgba1c - recently started 70/30 ~1 week ago, not compliant - Start 70/30 at 20u daily, increased later today to 30u daily to start tomorrow. - Aggreesive blood sugar control - Change SSI to moderate  Normocytic anemia - hgb- 10.6 this am - Anemia panel- TIBC, ferrition, iron - CBC am  HTN- stable, hypokalemia- 3.2 - Replete 40-meq Kdur Q12 H, 2 doses. - check Mag - Bmet am  Asthma  - Albuterol inh  Neck PAin- sounds musculoskeletal, no radicular symptoms. - neck  xrays- Loss of disc height and end plate spurring at A1-O8. - Flexeril 10mg  QHS - Ibuprofen 600mg  once - Consider MRI if pain persists, considering rectal abscess. - Volteren gel   DVT prophylaxis:lovenox Code Status: Full  Family Communication: No family present Disposition Plan: Pending surg eval, culture results, decide on antibiotic coverage.   Consultants:   Surgery  Procedures: I and D, wound vac placement- 04/11/16  Antimicrobials:  VAnc- 1/24 >  Zosyn- 1/24 >  Objective: Vitals:   04/11/16 1520 04/11/16 1900 04/12/16 0519 04/12/16 1357  BP: (!) 135/91 (!) 143/98 116/65 123/82  Pulse: 100 92 90 94  Resp: 17 17 17 18   Temp: 98.8 F (37.1 C) 99 F (37.2 C) 98 F (36.7 C) 98.4 F (36.9 C)  TempSrc:  Oral Oral Oral  SpO2: 100% 100% 100% 98%  Weight:      Height:        Intake/Output Summary (Last 24 hours) at 04/12/16 2001 Last data filed at 04/12/16 1533  Gross per 24 hour  Intake          3440.83 ml  Output             1350 ml  Net          2090.83 ml   Filed Weights   04/11/16 0057  Weight: 95.3 kg (210 lb)    Examination:  General exam: Appears calm and comfortable  Cervical- No tenderness on palpation of pts neck Respiratory system: Clear to auscultation. Respiratory  effort normal. Cardiovascular system: S1 & S2 heard, RRR. No JVD, murmurs, rubs, gallops or clicks. No pedal edema. Gastrointestinal system: Abdomen is nondistended, soft and nontender. No organomegaly or masses felt. Normal bowel sounds heard. Central nervous system: Alert and oriented. No focal neurological deficits. Extremities: Symmetric 5 x 5 power. No edema Buttock- Wound vac present, gluteal cleft Skin: No rashes, lesions or ulcers Psychiatry: Judgement and insight appear normal. Mood & affect appropriate.   Data Reviewed: I have personally reviewed following labs and imaging studies  CBC:  Recent Labs Lab 04/10/16 1125 04/11/16 0045 04/11/16 0057 04/11/16 1604    WBC 8.0 9.9  --  9.6  NEUTROABS 5,680 6.0  --   --   HGB 11.0* 10.1* 9.9* 10.6*  HCT 34.0* 29.3* 29.0* 30.7*  MCV 86.1 82.1  --  82.5  PLT 351 303  --  662   Basic Metabolic Panel:  Recent Labs Lab 04/10/16 1125 04/11/16 0045 04/11/16 0057 04/11/16 1604  NA 134* 133* 136 138  K 3.5 3.7 3.8 3.2*  CL 98 98* 95* 104  CO2 28 25  --  27  GLUCOSE 323* 418* 430* 94  BUN 9 11 10 7   CREATININE 0.73 0.93 0.80 0.63  CALCIUM 8.8 8.8*  --  8.3*   GFR: Estimated Creatinine Clearance: 149.9 mL/min (by C-G formula based on SCr of 0.63 mg/dL). Liver Function Tests:  Recent Labs Lab 04/11/16 0045  AST 18  ALT 10*  ALKPHOS 97  BILITOT 0.3  PROT 6.4*  ALBUMIN 3.1*   Coagulation Profile:  Recent Labs Lab 04/11/16 0330  INR 0.97   CBG:  Recent Labs Lab 04/11/16 1637 04/11/16 1954 04/12/16 0733 04/12/16 1144 04/12/16 1711  GLUCAP 106* 181* 257* 267* 256*   Sepsis Labs:  Recent Labs Lab 04/11/16 0102 04/11/16 0330 04/11/16 0343 04/11/16 1604  PROCALCITON  --  <0.10  --   --   LATICACIDVEN 2.75* 2.1* 2.19* 1.2    Recent Results (from the past 240 hour(s))  Blood Culture (routine x 2)     Status: None (Preliminary result)   Collection Time: 04/10/16 12:45 AM  Result Value Ref Range Status   Specimen Description BLOOD LEFT WRIST  Final   Special Requests BOTTLES DRAWN AEROBIC AND ANAEROBIC 5ML  Final   Culture   Final    NO GROWTH 1 DAY Performed at [facility], [place] [address] [place], [place] [address]    Report Status PENDING  Incomplete  Blood Culture (routine x 2)     Status: None (Preliminary result)   Collection Time: 04/11/16 12:35 AM  Result Value Ref Range Status   Specimen Description BLOOD LEFT HAND  Final   Special Requests BOTTLES DRAWN AEROBIC AND ANAEROBIC 5ML  Final   Culture   Final    NO GROWTH 1 DAY Performed at [facility], [place] [address], [place], [place] [address]    Report Status PENDING  Incomplete  Urine culture      Status: None   Collection Time: 04/11/16  2:27 AM  Result Value Ref Range Status   Specimen Description URINE, RANDOM  Final   Special Requests NONE  Final   Culture   Final    NO GROWTH Performed at [facility], [place] [address] [place], [place] [address]    Report Status 04/12/2016 FINAL  Final  Aerobic Culture (superficial specimen)     Status: None (Preliminary result)   Collection Time: 04/11/16  9:20 AM  Result Value Ref Range Status   Specimen Description ABSCESS RECTAL SWAB  Final   Special Requests NONE  Final   Gram Stain   Final    NO WBC SEEN ABUNDANT GRAM NEGATIVE RODS ABUNDANT GRAM POSITIVE COCCI IN PAIRS    Culture   Final    ABUNDANT STREPTOCOCCUS GROUP F CULTURE REINCUBATED FOR BETTER GROWTH Performed at [facility], [place] [address], [place], [place] [address]    Report Status PENDING  Incomplete  MRSA PCR Screening     Status: None   Collection Time: 04/11/16  5:10 PM  Result Value Ref Range Status   MRSA by PCR NEGATIVE NEGATIVE Final    Comment:        The GeneXpert MRSA Assay (FDA approved for NASAL specimens only), is one component of a comprehensive MRSA colonization surveillance program. It is not intended to diagnose MRSA infection nor to guide or monitor treatment for MRSA infections.          Radiology Studies: Dg Neck Soft Tissue  Result Date: 04/12/2016 CLINICAL DATA:  Neck pain. EXAM: NECK SOFT TISSUES - 1+ VIEW COMPARISON:  None. FINDINGS: Two-view study using soft tissue technique shows no prevertebral soft tissue swelling. No unexpected gas within the prevertebral soft tissues. Epiglottis and area epiglottic folds are unremarkable. Straightening of the normal cervical lordosis is evident. Loss of disc height with endplate spurring seen at C5-6. IMPRESSION: Negative. Electronically Signed   By: Misty Stanley M.D.   On: 04/12/2016 19:47   Ct Pelvis W Contrast  Result Date: 04/11/2016 CLINICAL DATA:  Bloody drainage  from site of prior surgery for sacral abscess. Initial encounter. EXAM: CT PELVIS WITH CONTRAST TECHNIQUE: Multidetector CT imaging of the pelvis was performed using the standard protocol following the bolus administration of intravenous contrast. CONTRAST:  100 mL of Isovue 300 IV contrast COMPARISON:  CT of the pelvis performed 02/22/2016 FINDINGS: Urinary Tract: The bladder is mildly distended and grossly unremarkable. Bowel: Visualized small and large bowel loops are grossly unremarkable in appearance. Vascular/Lymphatic: The visualized vasculature is grossly unremarkable in appearance. No retroperitoneal or pelvic sidewall lymphadenopathy is seen. No inguinal lymphadenopathy is appreciated. Reproductive: The prostate remains normal in size. The scrotum is unremarkable in appearance. Other: There is a surgical defect extending nearly to the level of the coccyx, with air again noted tracking superiorly from this location just superficial to the paraspinal musculature, and new associated soft tissue thickening, reflecting chronic inflammation. At the site of the prior large collection of air along the medial right buttocks, a smaller collection of air and trace fluid is now seen, measuring approximately 7.5 cm, with associated new soft tissue inflammation. More prominent overlying skin thickening is also seen. Musculoskeletal: The visualized musculature is grossly unremarkable. No acute osseous abnormalities are seen. Evaluation for osteomyelitis is limited on CT. IMPRESSION: 1. Surgical defect extending nearly to the level of the coccyx, with air tracking superiorly from this location just superficial to the paraspinal musculature, and new associated soft tissue thickening, reflecting chronic inflammation. Persistent infection cannot be excluded. 2. At the site of the prior large collection of air along the medial right buttock, a smaller collection of air and trace fluid is now seen, measuring approximately 7.5  cm, with new surrounding soft tissue inflammation and thickening, concerning for evolving abscess. More prominent overlying skin thickening noted. These results were called by telephone at the time of interpretation on 04/11/2016 at 1:28 am to Dr. Thayer Jew,  who verbally acknowledged these results. Electronically Signed   By: Garald Balding M.D.   On: 04/11/2016 01:31        Scheduled Meds: . enoxaparin (LOVENOX) injection  40 mg Subcutaneous Q24H  . insulin aspart  0-9 Units Subcutaneous TID WC  . [START ON 04/13/2016] insulin aspart protamine- aspart  30 Units Subcutaneous BID WC  . piperacillin-tazobactam (ZOSYN)  IV  3.375 g Intravenous Q8H  . sodium chloride flush  3 mL Intravenous Q12H  . vancomycin  1,000 mg Intravenous Q8H   Continuous Infusions: . sodium chloride 75 mL/hr at 04/12/16 1849     LOS: 1 day    Bethena Roys, MD [facility] Pager [phone]  If 7PM-7AM, please contact night-coverage [email or web address] Password [email or web address] 04/12/2016, 8:01 PM

## 2016-04-12 NOTE — Progress Notes (Signed)
1 Day Post-Op  Subjective: Eating and watching TV, buttocks feels better.    Objective: Vital signs in last 24 hours: Temp:  [97.6 F (36.4 C)-99 F (37.2 C)] 98 F (36.7 C) (01/25 0519) Pulse Rate:  [83-100] 90 (01/25 0519) Resp:  [11-19] 17 (01/25 0519) BP: (99-143)/(65-98) 116/65 (01/25 0519) SpO2:  [100 %] 100 % (01/25 0519)  1200 Po 4000 IV fluid Urine 1500 Afebrile, VSS Glucose 106-267  Intake/Output from previous day: 01/24 0701 - 01/25 0700 In: 5333.3 [P.O.:1200; I.V.:3433.3; IV Piggyback:700] Out: 1500 [Urine:1500] Intake/Output this shift: No intake/output data recorded.  General appearance: alert, cooperative and no distress dressing (wound vac in place)  Lab Results:   Recent Labs  04/11/16 0045 04/11/16 0057 04/11/16 1604  WBC 9.9  --  9.6  HGB 10.1* 9.9* 10.6*  HCT 29.3* 29.0* 30.7*  PLT 303  --  268    BMET  Recent Labs  04/11/16 0045 04/11/16 0057 04/11/16 1604  NA 133* 136 138  K 3.7 3.8 3.2*  CL 98* 95* 104  CO2 25  --  27  GLUCOSE 418* 430* 94  BUN 11 10 7   CREATININE 0.93 0.80 0.63  CALCIUM 8.8*  --  8.3*   PT/INR  Recent Labs  04/11/16 0330  LABPROT 12.9  INR 0.97     Recent Labs Lab 04/11/16 0045  AST 18  ALT 10*  ALKPHOS 97  BILITOT 0.3  PROT 6.4*  ALBUMIN 3.1*     Lipase     Component Value Date/Time   LIPASE 17 03/27/2016 1200     Studies/Results: Ct Pelvis W Contrast  Result Date: 04/11/2016 CLINICAL DATA:  Bloody drainage from site of prior surgery for sacral abscess. Initial encounter. EXAM: CT PELVIS WITH CONTRAST TECHNIQUE: Multidetector CT imaging of the pelvis was performed using the standard protocol following the bolus administration of intravenous contrast. CONTRAST:  100 mL of Isovue 300 IV contrast COMPARISON:  CT of the pelvis performed 02/22/2016 FINDINGS: Urinary Tract: The bladder is mildly distended and grossly unremarkable. Bowel: Visualized small and large bowel loops are grossly  unremarkable in appearance. Vascular/Lymphatic: The visualized vasculature is grossly unremarkable in appearance. No retroperitoneal or pelvic sidewall lymphadenopathy is seen. No inguinal lymphadenopathy is appreciated. Reproductive: The prostate remains normal in size. The scrotum is unremarkable in appearance. Other: There is a surgical defect extending nearly to the level of the coccyx, with air again noted tracking superiorly from this location just superficial to the paraspinal musculature, and new associated soft tissue thickening, reflecting chronic inflammation. At the site of the prior large collection of air along the medial right buttocks, a smaller collection of air and trace fluid is now seen, measuring approximately 7.5 cm, with associated new soft tissue inflammation. More prominent overlying skin thickening is also seen. Musculoskeletal: The visualized musculature is grossly unremarkable. No acute osseous abnormalities are seen. Evaluation for osteomyelitis is limited on CT. IMPRESSION: 1. Surgical defect extending nearly to the level of the coccyx, with air tracking superiorly from this location just superficial to the paraspinal musculature, and new associated soft tissue thickening, reflecting chronic inflammation. Persistent infection cannot be excluded. 2. At the site of the prior large collection of air along the medial right buttock, a smaller collection of air and trace fluid is now seen, measuring approximately 7.5 cm, with new surrounding soft tissue inflammation and thickening, concerning for evolving abscess. More prominent overlying skin thickening noted. These results were called by telephone at the time of interpretation  on 04/11/2016 at 1:28 am to Dr. Thayer Jew, who verbally acknowledged these results. Electronically Signed   By: Garald Balding M.D.   On: 04/11/2016 01:31    Medications: . insulin aspart  0-9 Units Subcutaneous TID WC  . insulin aspart protamine- aspart  20  Units Subcutaneous BID WC  . piperacillin-tazobactam (ZOSYN)  IV  3.375 g Intravenous Q8H  . sodium chloride flush  3 mL Intravenous Q12H  . vancomycin  1,000 mg Intravenous Q8H    Assessment/Plan Buttocks abscess, nonhealing sacral wound Uncontrolled AODM Medical non compliance with inadequate access to care Hypertension FEN:  IV fluids/carb modified NO:[id number] 04/11/16 =>> day 3 DVT:  SCD    Plan:  Go back to the OR for dressing change tomorrow.   LOS: 1 day    Tradarius Reinwald 04/12/2016 [phone]

## 2016-04-12 NOTE — Progress Notes (Signed)
Patient c/o "tightness" in neck. Patient was given an analgesic and K-Pad. PCP was notified.

## 2016-04-12 NOTE — Progress Notes (Signed)
PT Cancellation Note  Patient Details Name: [name] [name] MRN: [id number] DOB: [birth year]   Cancelled Treatment:    Reason Eval/Treat Not Completed: PT screened, no needs identified, will sign off. RN ambulated well.   Claretha Cooper 04/12/2016, 2:45 PM Tresa Endo PT [phone]

## 2016-04-13 ENCOUNTER — Encounter (HOSPITAL_COMMUNITY)
Admission: EM | Disposition: A | Discharge status: Home Health | Payer: Self-pay | Source: Home / Self Care | Attending: Internal Medicine

## 2016-04-13 ENCOUNTER — Encounter (HOSPITAL_COMMUNITY): Payer: Self-pay

## 2016-04-13 ENCOUNTER — Inpatient Hospital Stay (HOSPITAL_COMMUNITY): Payer: Self-pay | Admitting: Anesthesiology

## 2016-04-13 HISTORY — PX: DRESSING CHANGE UNDER ANESTHESIA: SHX5237

## 2016-04-13 LAB — GLUCOSE, CAPILLARY
Glucose-Capillary: 300 mg/dL — ABNORMAL HIGH (ref 65–99)
Glucose-Capillary: 291 mg/dL — ABNORMAL HIGH (ref 65–99)
Glucose-Capillary: 139 mg/dL — ABNORMAL HIGH (ref 65–99)
Glucose-Capillary: 337 mg/dL — ABNORMAL HIGH (ref 65–99)

## 2016-04-13 LAB — BASIC METABOLIC PANEL
Anion gap: 8 (ref 5–15)
BUN: 7 mg/dL (ref 6–20)
CHLORIDE: 103 mmol/L (ref 101–111)
CO2: 28 mmol/L (ref 22–32)
CREATININE: 0.75 mg/dL (ref 0.61–1.24)
Calcium: 8.9 mg/dL (ref 8.9–10.3)
GFR calc non Af Amer: >60 mL/min (ref >60)
GLUCOSE: 288 mg/dL — AB (ref 65–99)
Potassium: 4.2 mmol/L (ref 3.5–5.1)
Sodium: 139 mmol/L (ref 135–145)

## 2016-04-13 LAB — CBC
HEMATOCRIT: 31.1 % — AB (ref 39.0–52.0)
HEMOGLOBIN: 10.7 g/dL — AB (ref 13.0–17.0)
MCH: 28.2 pg (ref 26.0–34.0)
MCHC: 34.4 g/dL (ref 30.0–36.0)
MCV: 81.8 fL (ref 78.0–100.0)
Platelets: 296 10*3/uL (ref 150–400)
RBC: 3.8 MIL/uL — ABNORMAL LOW (ref 4.22–5.81)
RDW: 12.7 % (ref 11.5–15.5)
WBC: 5.1 10*3/uL (ref 4.0–10.5)

## 2016-04-13 LAB — HEMOGLOBIN A1C
Hgb A1c MFr Bld: 10.5 % — ABNORMAL HIGH (ref 4.8–5.6)
Mean Plasma Glucose: 255 mg/dL

## 2016-04-13 LAB — IRON AND TIBC
IRON: 31 ug/dL — AB (ref 45–182)
Saturation Ratios: 17 % — ABNORMAL LOW (ref 17.9–39.5)
TIBC: 188 ug/dL — ABNORMAL LOW (ref 250–450)
UIBC: 157 ug/dL

## 2016-04-13 LAB — MAGNESIUM: MAGNESIUM: 1.9 mg/dL (ref 1.7–2.4)

## 2016-04-13 LAB — FERRITIN: FERRITIN: 225 ng/mL (ref 24–336)

## 2016-04-13 SURGERY — REPLACEMENT, DRESSING, WITH ANESTHESIA
Anesthesia: General

## 2016-04-13 MED ORDER — ROCURONIUM BROMIDE 10 MG/ML (PF) SYRINGE
PREFILLED_SYRINGE | INTRAVENOUS | Status: DC | PRN
  Administered 2016-04-13: 25 mg via INTRAVENOUS

## 2016-04-13 MED ORDER — HYDROMORPHONE HCL 1 MG/ML IJ SOLN
INTRAMUSCULAR | Status: AC
Start: 2016-04-13 — End: 2016-04-13
  Administered 2016-04-13: 0.5 mg via INTRAVENOUS
  Filled 2016-04-13: qty 1

## 2016-04-13 MED ORDER — MIDAZOLAM HCL 5 MG/5ML IJ SOLN
INTRAMUSCULAR | Status: DC | PRN
  Administered 2016-04-13: 2 mg via INTRAVENOUS

## 2016-04-13 MED ORDER — PROPOFOL 10 MG/ML IV BOLUS
INTRAVENOUS | Status: AC
  Filled 2016-04-13: qty 20

## 2016-04-13 MED ORDER — INSULIN ASPART 100 UNIT/ML ~~LOC~~ SOLN
0.0000 [IU] | SUBCUTANEOUS | Status: DC
  Administered 2016-04-13: 11 [IU] via SUBCUTANEOUS

## 2016-04-13 MED ORDER — ISOPROPYL ALCOHOL 70 % SOLN
Status: AC
  Filled 2016-04-13: qty 480

## 2016-04-13 MED ORDER — LIDOCAINE 2% (20 MG/ML) 5 ML SYRINGE
INTRAMUSCULAR | Status: AC
  Filled 2016-04-13: qty 5

## 2016-04-13 MED ORDER — LACTATED RINGERS IV SOLN
INTRAVENOUS | Status: DC
  Administered 2016-04-13: 1000 mL via INTRAVENOUS

## 2016-04-13 MED ORDER — LIDOCAINE 2% (20 MG/ML) 5 ML SYRINGE
INTRAMUSCULAR | Status: DC | PRN
  Administered 2016-04-13: 50 mg via INTRAVENOUS

## 2016-04-13 MED ORDER — HYDROMORPHONE HCL 1 MG/ML IJ SOLN
0.2500 mg | INTRAMUSCULAR | Status: DC | PRN
  Administered 2016-04-13 (×2): 0.5 mg via INTRAVENOUS

## 2016-04-13 MED ORDER — PROPOFOL 10 MG/ML IV BOLUS
INTRAVENOUS | Status: DC | PRN
  Administered 2016-04-13: 150 mg via INTRAVENOUS

## 2016-04-13 MED ORDER — ISOPROPYL ALCOHOL 70 % SOLN
Status: DC | PRN
  Administered 2016-04-13: 1 via TOPICAL

## 2016-04-13 MED ORDER — SUCCINYLCHOLINE CHLORIDE 200 MG/10ML IV SOSY
PREFILLED_SYRINGE | INTRAVENOUS | Status: AC
  Filled 2016-04-13: qty 10

## 2016-04-13 MED ORDER — ROCURONIUM BROMIDE 50 MG/5ML IV SOSY
PREFILLED_SYRINGE | INTRAVENOUS | Status: AC
  Filled 2016-04-13: qty 5

## 2016-04-13 MED ORDER — FENTANYL CITRATE (PF) 100 MCG/2ML IJ SOLN
INTRAMUSCULAR | Status: DC | PRN
  Administered 2016-04-13: 100 ug via INTRAVENOUS

## 2016-04-13 MED ORDER — ONDANSETRON HCL 4 MG/2ML IJ SOLN
INTRAMUSCULAR | Status: AC
  Filled 2016-04-13: qty 2

## 2016-04-13 MED ORDER — HYDROGEN PEROXIDE 3 % EX SOLN
CUTANEOUS | Status: DC | PRN
  Administered 2016-04-13: 1

## 2016-04-13 MED ORDER — HYDROGEN PEROXIDE 3 % EX SOLN
CUTANEOUS | Status: AC
  Filled 2016-04-13: qty 473

## 2016-04-13 MED ORDER — MIDAZOLAM HCL 2 MG/2ML IJ SOLN
INTRAMUSCULAR | Status: AC
  Filled 2016-04-13: qty 2

## 2016-04-13 MED ORDER — INSULIN ASPART 100 UNIT/ML ~~LOC~~ SOLN
0.0000 [IU] | Freq: Three times a day (TID) | SUBCUTANEOUS | Status: DC
  Administered 2016-04-14: 8 [IU] via SUBCUTANEOUS
  Administered 2016-04-14: 3 [IU] via SUBCUTANEOUS
  Administered 2016-04-14 – 2016-04-15 (×2): 8 [IU] via SUBCUTANEOUS
  Administered 2016-04-15: 5 [IU] via SUBCUTANEOUS
  Administered 2016-04-16: 8 [IU] via SUBCUTANEOUS
  Administered 2016-04-16 (×2): 5 [IU] via SUBCUTANEOUS
  Administered 2016-04-17: 11 [IU] via SUBCUTANEOUS
  Administered 2016-04-17: 5 [IU] via SUBCUTANEOUS

## 2016-04-13 MED ORDER — ONDANSETRON HCL 4 MG/2ML IJ SOLN
INTRAMUSCULAR | Status: DC | PRN
  Administered 2016-04-13: 4 mg via INTRAVENOUS

## 2016-04-13 MED ORDER — INSULIN ASPART 100 UNIT/ML ~~LOC~~ SOLN
5.0000 [IU] | Freq: Once | SUBCUTANEOUS | Status: AC
  Administered 2016-04-13: 5 [IU] via SUBCUTANEOUS

## 2016-04-13 MED ORDER — PHENYLEPHRINE 40 MCG/ML (10ML) SYRINGE FOR IV PUSH (FOR BLOOD PRESSURE SUPPORT)
PREFILLED_SYRINGE | INTRAVENOUS | Status: AC
  Filled 2016-04-13: qty 10

## 2016-04-13 MED ORDER — FENTANYL CITRATE (PF) 100 MCG/2ML IJ SOLN
INTRAMUSCULAR | Status: AC
  Filled 2016-04-13: qty 2

## 2016-04-13 MED ORDER — PHENYLEPHRINE 40 MCG/ML (10ML) SYRINGE FOR IV PUSH (FOR BLOOD PRESSURE SUPPORT)
PREFILLED_SYRINGE | INTRAVENOUS | Status: DC | PRN
  Administered 2016-04-13 (×3): 40 ug via INTRAVENOUS

## 2016-04-13 MED ORDER — SUGAMMADEX SODIUM 200 MG/2ML IV SOLN
INTRAVENOUS | Status: DC | PRN
  Administered 2016-04-13: 200 mg via INTRAVENOUS

## 2016-04-13 MED ORDER — PROMETHAZINE HCL 25 MG/ML IJ SOLN: 6.2500 mg | INTRAMUSCULAR | Status: DC | PRN

## 2016-04-13 SURGICAL SUPPLY — 20 items
BLADE SURG SZ20 CARB STEEL (BLADE) IMPLANT
COVER SURGICAL LIGHT HANDLE (MISCELLANEOUS) ×3 IMPLANT
DRAPE SHEET LG 3/4 BI-LAMINATE (DRAPES) IMPLANT
DRSG VAC ATS SM SENSATRAC (GAUZE/BANDAGES/DRESSINGS) ×3 IMPLANT
ELECT PENCIL ROCKER SW 15FT (MISCELLANEOUS) IMPLANT
GAUZE IODOFORM PACK 1/2 7832 (GAUZE/BANDAGES/DRESSINGS) ×3 IMPLANT
GAUZE SPONGE 4X4 12PLY STRL (GAUZE/BANDAGES/DRESSINGS) ×3 IMPLANT
GAUZE SPONGE 4X4 16PLY XRAY LF (GAUZE/BANDAGES/DRESSINGS) IMPLANT
GLOVE BIOGEL M 8.0 STRL (GLOVE) ×3 IMPLANT
GLOVE BIOGEL PI IND STRL 7.0 (GLOVE) IMPLANT
GLOVE BIOGEL PI INDICATOR 7.0 (GLOVE)
GOWN SPEC L4 XLG W/TWL (GOWN DISPOSABLE) IMPLANT
GOWN STRL REUS W/TWL LRG LVL3 (GOWN DISPOSABLE) ×6 IMPLANT
GOWN STRL REUS W/TWL XL LVL3 (GOWN DISPOSABLE) IMPLANT
KIT BASIN OR (CUSTOM PROCEDURE TRAY) ×3 IMPLANT
PACK GENERAL/GYN (CUSTOM PROCEDURE TRAY) ×3 IMPLANT
PACK LITHOTOMY IV (CUSTOM PROCEDURE TRAY) IMPLANT
SWAB CULTURE ESWAB REG 1ML (MISCELLANEOUS) IMPLANT
TOWEL OR 17X26 10 PK STRL BLUE (TOWEL DISPOSABLE) ×3 IMPLANT
YANKAUER SUCT BULB TIP 10FT TU (MISCELLANEOUS) IMPLANT

## 2016-04-13 NOTE — Anesthesia Procedure Notes (Signed)
Procedure Name: Intubation Date/Time: 04/13/2016 4:23 PM Performed by: Anne Fu Pre-anesthesia Checklist: Patient identified, Emergency Drugs available, Suction available, Patient being monitored and Timeout performed Patient Re-evaluated:Patient Re-evaluated prior to inductionOxygen Delivery Method: Circle system utilized Preoxygenation: Pre-oxygenation with 100% oxygen Intubation Type: IV induction Ventilation: Mask ventilation without difficulty Laryngoscope Size: Mac and 4 Grade View: Grade I Tube type: Oral Tube size: 7.5 mm Number of attempts: 1 Airway Equipment and Method: Stylet Placement Confirmation: ETT inserted through vocal cords under direct vision,  positive ETCO2,  CO2 detector and breath sounds checked- equal and bilateral Secured at: 22 cm Tube secured with: Tape Dental Injury: Teeth and Oropharynx as per pre-operative assessment

## 2016-04-13 NOTE — Anesthesia Preprocedure Evaluation (Addendum)
Anesthesia Evaluation  Patient identified by MRN, date of birth, ID band Patient awake    Reviewed: Allergy & Precautions, NPO status , Patient's Chart, lab work & pertinent test results  Airway Mallampati: II  TM Distance: >3 FB Neck ROM: Full    Dental no notable dental hx.    Pulmonary asthma ,    Pulmonary exam normal breath sounds clear to auscultation       Cardiovascular hypertension, Normal cardiovascular exam Rhythm:Regular Rate:Normal     Neuro/Psych negative neurological ROS  negative psych ROS   GI/Hepatic negative GI ROS, Neg liver ROS,   Endo/Other  diabetes, Poorly Controlled  Renal/GU Renal InsufficiencyRenal disease  negative genitourinary   Musculoskeletal negative musculoskeletal ROS (+)   Abdominal   Peds negative pediatric ROS (+)  Hematology negative hematology ROS (+) anemia ,   Anesthesia Other Findings   Reproductive/Obstetrics negative OB ROS                            Anesthesia Physical Anesthesia Plan  ASA: III  Anesthesia Plan: General   Post-op Pain Management:    Induction: Intravenous  Airway Management Planned: Oral ETT  Additional Equipment:   Intra-op Plan:   Post-operative Plan: Extubation in OR  Informed Consent: I have reviewed the patients History and Physical, chart, labs and discussed the procedure including the risks, benefits and alternatives for the proposed anesthesia with the patient or authorized representative who has indicated his/her understanding and acceptance.   Dental advisory given  Plan Discussed with: CRNA and Surgeon  Anesthesia Plan Comments:        Anesthesia Quick Evaluation

## 2016-04-13 NOTE — H&P (View-Only) (Signed)
1 Day Post-Op  Subjective: Eating and watching TV, buttocks feels better.    Objective: Vital signs in last 24 hours: Temp:  [97.6 F (36.4 C)-99 F (37.2 C)] 98 F (36.7 C) (01/25 0519) Pulse Rate:  [83-100] 90 (01/25 0519) Resp:  [11-19] 17 (01/25 0519) BP: (99-143)/(65-98) 116/65 (01/25 0519) SpO2:  [100 %] 100 % (01/25 0519)  1200 Po 4000 IV fluid Urine 1500 Afebrile, VSS Glucose 106-267  Intake/Output from previous day: 01/24 0701 - 01/25 0700 In: 5333.3 [P.O.:1200; I.V.:3433.3; IV Piggyback:700] Out: 1500 [Urine:1500] Intake/Output this shift: No intake/output data recorded.  General appearance: alert, cooperative and no distress dressing (wound vac in place)  Lab Results:   Recent Labs  04/11/16 0045 04/11/16 0057 04/11/16 1604  WBC 9.9  --  9.6  HGB 10.1* 9.9* 10.6*  HCT 29.3* 29.0* 30.7*  PLT 303  --  268    BMET  Recent Labs  04/11/16 0045 04/11/16 0057 04/11/16 1604  NA 133* 136 138  K 3.7 3.8 3.2*  CL 98* 95* 104  CO2 25  --  27  GLUCOSE 418* 430* 94  BUN 11 10 7   CREATININE 0.93 0.80 0.63  CALCIUM 8.8*  --  8.3*   PT/INR  Recent Labs  04/11/16 0330  LABPROT 12.9  INR 0.97     Recent Labs Lab 04/11/16 0045  AST 18  ALT 10*  ALKPHOS 97  BILITOT 0.3  PROT 6.4*  ALBUMIN 3.1*     Lipase     Component Value Date/Time   LIPASE 17 03/27/2016 1200     Studies/Results: Ct Pelvis W Contrast  Result Date: 04/11/2016 CLINICAL DATA:  Bloody drainage from site of prior surgery for sacral abscess. Initial encounter. EXAM: CT PELVIS WITH CONTRAST TECHNIQUE: Multidetector CT imaging of the pelvis was performed using the standard protocol following the bolus administration of intravenous contrast. CONTRAST:  100 mL of Isovue 300 IV contrast COMPARISON:  CT of the pelvis performed 02/22/2016 FINDINGS: Urinary Tract: The bladder is mildly distended and grossly unremarkable. Bowel: Visualized small and large bowel loops are grossly  unremarkable in appearance. Vascular/Lymphatic: The visualized vasculature is grossly unremarkable in appearance. No retroperitoneal or pelvic sidewall lymphadenopathy is seen. No inguinal lymphadenopathy is appreciated. Reproductive: The prostate remains normal in size. The scrotum is unremarkable in appearance. Other: There is a surgical defect extending nearly to the level of the coccyx, with air again noted tracking superiorly from this location just superficial to the paraspinal musculature, and new associated soft tissue thickening, reflecting chronic inflammation. At the site of the prior large collection of air along the medial right buttocks, a smaller collection of air and trace fluid is now seen, measuring approximately 7.5 cm, with associated new soft tissue inflammation. More prominent overlying skin thickening is also seen. Musculoskeletal: The visualized musculature is grossly unremarkable. No acute osseous abnormalities are seen. Evaluation for osteomyelitis is limited on CT. IMPRESSION: 1. Surgical defect extending nearly to the level of the coccyx, with air tracking superiorly from this location just superficial to the paraspinal musculature, and new associated soft tissue thickening, reflecting chronic inflammation. Persistent infection cannot be excluded. 2. At the site of the prior large collection of air along the medial right buttock, a smaller collection of air and trace fluid is now seen, measuring approximately 7.5 cm, with new surrounding soft tissue inflammation and thickening, concerning for evolving abscess. More prominent overlying skin thickening noted. These results were called by telephone at the time of interpretation  on 04/11/2016 at 1:28 am to Dr. Thayer Jew, who verbally acknowledged these results. Electronically Signed   By: Garald Balding M.D.   On: 04/11/2016 01:31    Medications: . insulin aspart  0-9 Units Subcutaneous TID WC  . insulin aspart protamine- aspart  20  Units Subcutaneous BID WC  . piperacillin-tazobactam (ZOSYN)  IV  3.375 g Intravenous Q8H  . sodium chloride flush  3 mL Intravenous Q12H  . vancomycin  1,000 mg Intravenous Q8H    Assessment/Plan Buttocks abscess, nonhealing sacral wound Uncontrolled AODM Medical non compliance with inadequate access to care Hypertension FEN:  IV fluids/carb modified [id number] 04/11/16 =>> day 3 DVT:  SCD    Plan:  Go back to the OR for dressing change tomorrow.   LOS: 1 day    Erbie Arment 04/12/2016 [phone]

## 2016-04-13 NOTE — Progress Notes (Addendum)
PROGRESS NOTE    [name] [name]  ELF:[id number] DOB: October 18, 1985 DOA: 04/10/2016 PCP: Maren Reamer, MD   Brief Narrative: 31 y.o.malewith history of poorly controlled diabetes, HTN, and asthma who presentedwith concern for a wound infection. He iss/p sacral abscess excision on 02/24/2016. He completed antibiotic treatment and was supposed to have a follow up appt w/wound care for a wound vac placement but he was unable to keep this appointment. He presented to the ED c/o worsening pain over the sacral wound area w/ bloody drainage. CT abdomen/pelvis in the ED showed chronic inflammation with likely a new evolving abscess over the right buttock.  Pt had I&D with Vac placement- 1/24  Assessment & Plan:   Principal Problem:   Pelvic abscess in male [facility]) Active Problems:   Uncontrolled diabetes mellitus ([place])   Sepsis, unspecified organism ([place])   Wound infection   Asthma  Recurrent pelvic abscess- Wound vac placed with I and D- 1/24, back to OR- 1/26.  No fevers, No wbc. - Cont broad spectrum- Vanc and zosyn 1/24 > - F/u culture results from I and D- 124- growing strep and staph, susceptibilities pending, can narrow abtics tomorrow. - PT eval, an Pt needs, and recommendations on discharge- No PT needs - Appreciate surg eval - On IVF, 75cc/hr, NPO today, D/c after sugery, lactic acidosis 2.75 has resolved.  Uncontrolled DM2- Hgba1c- 10.5. - Check Hgba1c - recently started 70/30 ~1 week ago, not compliant - Re-Start 70/30, no won 30U daily, interrupted with NPO status.  - Aggreesive blood sugar control, with infection - SSI moderate  Normocytic anemia - hgb- 10.6 this am - Anemia panel- TIBC- low 188, ferritin- 177, iron saturation- low  - CBC am  HTN- stable, hypokalemia- 3.2, now 4.2. - Repleted  - Mag normal- 1.9  Asthma  - Albuterol inh  Neck PAin- Present intermittently for the past 2 years, onld when lying down, but recently worse, mild radicular symptoms-  tingling right shoulder. Cervical xrays- Loss of disc height and end plate spurring at H8-N2. Pain likely degenerative disc dx, though early diagnosis for age.  - Conservative mgt with Flexeril 10mg  QHS and ibuprofen, with improvement. - Will order CT cervical spine for am, considering pelvic abscess, loss of disc height in this young pt, concern for discitis. Planned for am, as Pt in surgery today and then PACU. - Volteren gel  DVT prophylaxis: Lovenox.  Code Status: Full  Family Communication: Not present.  Disposition Plan: Pending surgery recommendation, Ct neck and culture results  Consultants:   Surgery  Procedures: I And D, Wound Vac placement.    Antimicrobials:    VAnc- 1/24 >  Zosyn- 1/24   Subjective: Neck pain improved with ibuprofen and flexeril. Neck pain has been present intermittently for 2 years. Center, back of neck. No weakness of extremities, bowel or bowel incontinence, but endorses some radicular symptoms- tingling, down- right shoulder.  Objective: Vitals:   04/12/16 1357 04/12/16 2234 04/13/16 0625 04/13/16 1317  BP: 123/82 (!) 160/88 128/81 (!) 152/109  Pulse: 94 94 85 87  Resp: 18 19 18 20   Temp: 98.4 F (36.9 C) 98.7 F (37.1 C) 97.3 F (36.3 C) 98.4 F (36.9 C)  TempSrc: Oral Oral Oral Oral  SpO2: 98% 100% 100% 99%  Weight:      Height:        Intake/Output Summary (Last 24 hours) at 04/13/16 1706 Last data filed at 04/13/16 1316  Gross per 24 hour  Intake  1333.75 ml  Output              800 ml  Net           533.75 ml   Filed Weights   04/11/16 0057  Weight: 95.3 kg (210 lb)    Examination:  General exam: Appears calm and comfortable  Cervical- No tenderness on palpation of pts neck Respiratory system: Clear to auscultation. Respiratory effort normal. Cardiovascular system: S1 & S2 heard, RRR. No JVD, murmurs, rubs, gallops or clicks. No pedal edema. Gastrointestinal system: Abdomen is nondistended, soft and  nontender. No organomegaly or masses felt. Normal bowel sounds heard. Central nervous system: Alert and oriented. No focal neurological deficits. Extremities: Symmetric 5 x 5 power. No edema Buttock- Wound vac present, gluteal cleft, with purulent, dark drainage. Skin: No rashes, lesions or ulcers Psychiatry: Judgement and insight appear normal. Mood & affect appropriate.   Data Reviewed: I have personally reviewed following labs and imaging studies  CBC:  Recent Labs Lab 04/10/16 1125 04/11/16 0045 04/11/16 0057 04/11/16 1604 04/13/16 0555  WBC 8.0 9.9  --  9.6 5.1  NEUTROABS 5,680 6.0  --   --   --   HGB 11.0* 10.1* 9.9* 10.6* 10.7*  HCT 34.0* 29.3* 29.0* 30.7* 31.1*  MCV 86.1 82.1  --  82.5 81.8  PLT 351 303  --  268 416   Basic Metabolic Panel:  Recent Labs Lab 04/10/16 1125 04/11/16 0045 04/11/16 0057 04/11/16 1604 04/13/16 0555  NA 134* 133* 136 138 139  K 3.5 3.7 3.8 3.2* 4.2  CL 98 98* 95* 104 103  CO2 28 25  --  27 28  GLUCOSE 323* 418* 430* 94 288*  BUN 9 11 10 7 7   CREATININE 0.73 0.93 0.80 0.63 0.75  CALCIUM 8.8 8.8*  --  8.3* 8.9  MG  --   --   --   --  1.9   GFR: Estimated Creatinine Clearance: 149.9 mL/min (by C-G formula based on SCr of 0.75 mg/dL). Liver Function Tests:  Recent Labs Lab 04/11/16 0045  AST 18  ALT 10*  ALKPHOS 97  BILITOT 0.3  PROT 6.4*  ALBUMIN 3.1*   Coagulation Profile:  Recent Labs Lab 04/11/16 0330  INR 0.97   HbA1C:  Recent Labs  04/11/16 1604  HGBA1C 10.5*   CBG:  Recent Labs Lab 04/12/16 1144 04/12/16 1711 04/12/16 2237 04/13/16 0804 04/13/16 1108  GLUCAP 267* 256* 213* 300* 291*   Anemia Panel:  Recent Labs  04/13/16 0827  FERRITIN 225  TIBC 188*  IRON 31*   Sepsis Labs:  Recent Labs Lab 04/11/16 0102 04/11/16 0330 04/11/16 0343 04/11/16 1604  PROCALCITON  --  <0.10  --   --   LATICACIDVEN 2.75* 2.1* 2.19* 1.2    Recent Results (from the past 240 hour(s))  Blood Culture  (routine x 2)     Status: None (Preliminary result)   Collection Time: 04/10/16 12:45 AM  Result Value Ref Range Status   Specimen Description BLOOD LEFT WRIST  Final   Special Requests BOTTLES DRAWN AEROBIC AND ANAEROBIC 5ML  Final   Culture   Final    NO GROWTH 2 DAYS Performed at [facility], [place] [address] [place], [place] [address]    Report Status PENDING  Incomplete  Blood Culture (routine x 2)     Status: None (Preliminary result)   Collection Time: 04/11/16 12:35 AM  Result Value Ref Range Status   Specimen  Description BLOOD LEFT HAND  Final   Special Requests BOTTLES DRAWN AEROBIC AND ANAEROBIC 5ML  Final   Culture   Final    NO GROWTH 2 DAYS Performed at [facility], [address], [place], [place] [address]    Report Status PENDING  Incomplete  Urine culture     Status: None   Collection Time: 04/11/16  2:27 AM  Result Value Ref Range Status   Specimen Description URINE, RANDOM  Final   Special Requests NONE  Final   Culture   Final    NO GROWTH Performed at [facility], [place] [address], [place], [place] [address]    Report Status 04/12/2016 FINAL  Final  Aerobic Culture (superficial specimen)     Status: None (Preliminary result)   Collection Time: 04/11/16  9:20 AM  Result Value Ref Range Status   Specimen Description ABSCESS RECTAL SWAB  Final   Special Requests NONE  Final   Gram Stain   Final    NO WBC SEEN ABUNDANT GRAM NEGATIVE RODS ABUNDANT GRAM POSITIVE COCCI IN PAIRS    Culture   Final    ABUNDANT STREPTOCOCCUS GROUP F RARE STAPHYLOCOCCUS AUREUS SUSCEPTIBILITIES TO FOLLOW Performed at [facility], [place] [address] [place], [place] [address]    Report Status PENDING  Incomplete  MRSA PCR Screening     Status: None   Collection Time: 04/11/16  5:10 PM  Result Value Ref Range Status   MRSA by PCR NEGATIVE NEGATIVE Final    Comment:        The GeneXpert MRSA Assay (FDA approved for NASAL specimens only), is one  component of a comprehensive MRSA colonization surveillance program. It is not intended to diagnose MRSA infection nor to guide or monitor treatment for MRSA infections.     Radiology Studies: Dg Neck Soft Tissue  Result Date: 04/12/2016 CLINICAL DATA:  Neck pain. EXAM: NECK SOFT TISSUES - 1+ VIEW COMPARISON:  None. FINDINGS: Two-view study using soft tissue technique shows no prevertebral soft tissue swelling. No unexpected gas within the prevertebral soft tissues. Epiglottis and area epiglottic folds are unremarkable. Straightening of the normal cervical lordosis is evident. Loss of disc height with endplate spurring seen at C5-6. IMPRESSION: Negative. Electronically Signed   By: Misty Stanley M.D.   On: 04/12/2016 19:47    Scheduled Meds: . [MAR Hold] cyclobenzaprine  10 mg Oral QHS  . [MAR Hold] diclofenac sodium  4 g Topical QID  . [MAR Hold] enoxaparin (LOVENOX) injection  40 mg Subcutaneous Q24H  . [MAR Hold] insulin aspart  0-20 Units Subcutaneous Q4H  . [MAR Hold] insulin aspart protamine- aspart  30 Units Subcutaneous BID WC  . [MAR Hold] piperacillin-tazobactam (ZOSYN)  IV  3.375 g Intravenous Q8H  . [MAR Hold] potassium chloride  40 mEq Oral Q12H  . [MAR Hold] sodium chloride flush  3 mL Intravenous Q12H  . [MAR Hold] vancomycin  1,000 mg Intravenous Q8H   Continuous Infusions: . sodium chloride Stopped (04/13/16 1549)  . lactated ringers 1,000 mL (04/13/16 1551)     LOS: 2 days   Bethena Roys, MD [facility] Pager [phone]  If 7PM-7AM, please contact night-coverage [email or web address] Password [email or web address] 04/13/2016, 5:06 PM

## 2016-04-13 NOTE — Transfer of Care (Signed)
Immediate Anesthesia Transfer of Care Note  Patient: [name] [name]  Procedure(s) Performed: Procedure(s): DRESSING CHANGE UNDER ANESTHESIA (N/A)  Patient Location: PACU  Anesthesia Type:General  Level of Consciousness:  sedated, patient cooperative and responds to stimulation  Airway & Oxygen Therapy:Patient Spontanous Breathing and Patient connected to face mask oxgen  Post-op Assessment:  Report given to PACU RN and Post -op Vital signs reviewed and stable  Post vital signs:  Reviewed and stable  Last Vitals:  Vitals:   04/13/16 0625 04/13/16 1317  BP: 128/81 (!) 152/109  Pulse: 85 87  Resp: 18 20  Temp: 36.3 C 44.5 C    Complications: No apparent anesthesia complications

## 2016-04-13 NOTE — Progress Notes (Signed)
Pt CHG bath redone due to postponement of surgery.  OR notified of pt's elevated BP.  Surgery tech to bring patient to OR now; report has been given to [place] in [place].

## 2016-04-13 NOTE — Interval H&P Note (Signed)
History and Physical Interval Note:  04/13/2016 3:43 PM  [name] [name]  has presented today for surgery, with the diagnosis of peri rectal abscess  The various methods of treatment have been discussed with the patient and family. After consideration of risks, benefits and other options for treatment, the patient has consented to  Procedure(s): DRESSING CHANGE UNDER ANESTHESIA (N/A) as a surgical intervention .  The patient's history has been reviewed, patient examined, no change in status, stable for surgery.  I have reviewed the patient's chart and labs.  Questions were answered to the patient's satisfaction.     Azie Mcconahy B

## 2016-04-13 NOTE — Op Note (Signed)
Surgeon: Kaylyn Lim, MD, FACS  Asst:  none  Anes:  General in prone position  Preop Dx: Complex abscess wound of buttocks Postop Dx: same  Procedure: VAC change under general Location Surgery: [facility] Complications: none  EBL:   minimal cc  Drains: None but VAC  Description of Procedure:  The patient was taken to OR 2 .  After anesthesia was administered and the patient was prepped a timeout was performed.  The old VAC sponge was removed and the base of the lower cavity was debrided sharply of some necrotic material.  The cavity was irrigated with hydrogen peroxide.  The cavity appeared smaller than before .  It was repacked similarly to prior OR procedure:      The patient tolerated the procedure well and was taken to the PACU in stable condition.     Matt B. Hassell Done, [place], [facility], [place]

## 2016-04-13 NOTE — Progress Notes (Signed)
Inpatient Diabetes Program Recommendations  AACE/ADA: New Consensus Statement on Inpatient Glycemic Control (2015)  Target Ranges:  Prepandial:   less than 140 mg/dL      Peak postprandial:   less than 180 mg/dL (1-2 hours)      Critically ill patients:  140 - 180 mg/dL   Lab Results  Component Value Date   GLUCAP 139 (H) 04/13/2016   HGBA1C 10.5 (H) 04/11/2016    Review of Glycemic Control  Diabetes history: DM2 Outpatient Diabetes medications: 70/30 40 units bid, metfo  Current orders for Inpatient glycemic control:   Inpatient Diabetes Program Recommendations:   Patient is not able to afford Lantus and will need an affordable insulin for outpatient DM control. Please consider ordering 70/30 35 units BID starting with supper today (which will provide a total of 56 units for basal and 24 units for meal coverage per day).  Outpatient DM regimen: MD, please consider discharging patient on NOVOLIN 70/30 insulin which patient can purchase for $25 per vial.  Will follow.  Thank you. Lorenda Peck, RD, LDN, CDE Inpatient Diabetes Coordinator [phone]

## 2016-04-13 NOTE — Anesthesia Postprocedure Evaluation (Addendum)
Anesthesia Post Note  Patient: [name] [name]  Procedure(s) Performed: Procedure(s) (LRB): DRESSING CHANGE UNDER ANESTHESIA (N/A)  Patient location during evaluation: PACU Anesthesia Type: General Level of consciousness: awake and alert Pain management: pain level controlled Vital Signs Assessment: post-procedure vital signs reviewed and stable Respiratory status: spontaneous breathing, nonlabored ventilation, respiratory function stable and patient connected to nasal cannula oxygen Cardiovascular status: blood pressure returned to baseline and stable Postop Assessment: no signs of nausea or vomiting Anesthetic complications: no       Last Vitals:  Vitals:   04/13/16 1740 04/13/16 1753  BP: 125/86 (!) 135/91  Pulse: 79 82  Resp: 14 16  Temp: 36.8 C 36.9 C    Last Pain:  Vitals:   04/13/16 1730  TempSrc:   PainSc: 5                  [name] S

## 2016-04-14 ENCOUNTER — Encounter (HOSPITAL_COMMUNITY): Payer: Self-pay

## 2016-04-14 ENCOUNTER — Inpatient Hospital Stay (HOSPITAL_COMMUNITY): Payer: Self-pay

## 2016-04-14 DIAGNOSIS — M542 Cervicalgia: Secondary | ICD-10-CM

## 2016-04-14 DIAGNOSIS — J4520 Mild intermittent asthma, uncomplicated: Secondary | ICD-10-CM

## 2016-04-14 LAB — BASIC METABOLIC PANEL
ANION GAP: 9 (ref 5–15)
BUN: 7 mg/dL (ref 6–20)
CALCIUM: 9.1 mg/dL (ref 8.9–10.3)
CO2: 28 mmol/L (ref 22–32)
Chloride: 103 mmol/L (ref 101–111)
Creatinine, Ser: 0.82 mg/dL (ref 0.61–1.24)
GFR calc Af Amer: >60 mL/min (ref >60)
GFR calc non Af Amer: >60 mL/min (ref >60)
Glucose, Bld: 259 mg/dL — ABNORMAL HIGH (ref 65–99)
Potassium: 4.3 mmol/L (ref 3.5–5.1)
Sodium: 140 mmol/L (ref 135–145)

## 2016-04-14 LAB — GLUCOSE, CAPILLARY
GLUCOSE-CAPILLARY: 186 mg/dL — AB (ref 65–99)
GLUCOSE-CAPILLARY: 217 mg/dL — AB (ref 65–99)
Glucose-Capillary: 297 mg/dL — ABNORMAL HIGH (ref 65–99)
Glucose-Capillary: 298 mg/dL — ABNORMAL HIGH (ref 65–99)

## 2016-04-14 LAB — CBC
HEMATOCRIT: 32.2 % — AB (ref 39.0–52.0)
Hemoglobin: 11.1 g/dL — ABNORMAL LOW (ref 13.0–17.0)
MCH: 27.8 pg (ref 26.0–34.0)
MCHC: 34.5 g/dL (ref 30.0–36.0)
MCV: 80.7 fL (ref 78.0–100.0)
Platelets: 378 10*3/uL (ref 150–400)
RBC: 3.99 MIL/uL — ABNORMAL LOW (ref 4.22–5.81)
RDW: 12.5 % (ref 11.5–15.5)
WBC: 7.6 10*3/uL (ref 4.0–10.5)

## 2016-04-14 MED ORDER — FERROUS SULFATE 325 (65 FE) MG PO TABS
325.0000 mg | ORAL_TABLET | Freq: Every day | ORAL | Status: DC
  Administered 2016-04-15 – 2016-04-17 (×3): 325 mg via ORAL
  Filled 2016-04-14 (×3): qty 1

## 2016-04-14 MED ORDER — AMLODIPINE BESYLATE 5 MG PO TABS
5.0000 mg | ORAL_TABLET | Freq: Every day | ORAL | Status: DC
  Administered 2016-04-14 – 2016-04-17 (×4): 5 mg via ORAL
  Filled 2016-04-14 (×4): qty 1

## 2016-04-14 NOTE — Progress Notes (Signed)
PROGRESS NOTE    [name] [name]  JEH:[id number] DOB: 1985/07/16 DOA: 04/10/2016 PCP: Maren Reamer, MD    Brief Narrative:  31 y.o.malewith history of poorly controlled diabetes, HTN, and asthma who presentedwith concern for a wound infection. He iss/p sacral abscess excision on 02/24/2016. He completed antibiotic treatment and was supposed to have a follow up appt w/wound care for a wound vac placement but he was unable to keep this appointment. He presented to the ED c/o worsening pain over the sacral wound area w/ bloody drainage on 04/11/16. CT abdomen/pelvis in the ED showed chronic inflammation with likely a new evolving abscess over the right buttock.  Pt had I&D with Vac placement- 1/24 Pt went to OR for surgical debridement 1/26   Assessment & Plan:   Pelvic abscess in male  - He has had a complicated history with I&D on 1/24, OR on 1/26 with wound vac placement - Today during CT scan, wound vac displaced.  Based on nursing note, may go back to OR tomorrow for replacement of wound vac, otherwise the plan was to replace wound vac Monday - Continue broad spectrum Abx, Vanc/Zosyn - Cultures:  BC X 2 1/23 NGTD; WC 1/24 showed abundant strep group F and some staph aureus, gram stain with GNR and GPC.  Awaiting further speciation and susceptibilities.  - If no surgery tomorrow, DC IVF.  If surgery tomorrow, continue current IVF and make NPO at midnight.   - WBC 7.6  - Surgery team following, reviewed note - Zofran for nausea  Uncontrolled diabetes mellitus  - A1C 10.5 - Insulin 70/30 at 30 Units BID - SSI moderate - 24 hour insulin need: 24 units yesterday, but long acting held - Given interruption for NPO days, unclear daily insulin needs at this time.   - Needs aggressive control given active infection.     Sepsis, unspecified organism  - Related to above and this is resolved - BP is now hypertensive - Lactic acidosis resolved    Normocytic Anemia - Present since last  summer, likely related to ongoing infection - Iron low, saturation low, ferritin inappropriate normal (acute phase reactant) - Start daily iron with colace - Recheck once acute illness resolved    Asthma - Home albuterol  HTN  - Now hypertensive, 378H systolic today - Was on norvasc previously over the summer, but this was held after last admission due to noncompliance and relative normotension -  Pain level 0-3 on assessment, not related I think  - Restart low dose norvasc, 5mg  today. 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  He reports no injuries to the spine in his life - continue voltaren gel, flexeril and ibuprofen/tylenol, norco  DVT prophylaxis: lovenox Code Status: Full Family Communication: Friend at bedside Disposition Plan: Pending surgical needs, wound vac.    Consultants:   Surgery  Procedures:  I&D with Vac placement- 1/24 OR for surgical debridement 1/26   Antimicrobials:  Vancomycin - 1/24 --> Zosyn- 1/24  -->   Subjective: Patient reports no complaints this morning. Doing well.  Last BM yesterday.    Objective: Vitals:   04/13/16 1740 04/13/16 1753 04/13/16 2129 04/14/16 0551  BP: 125/86 (!) 135/91 (!) 157/102 (!) 152/109  Pulse: 79 82 95 87  Resp: 14 16 16 18   Temp: 98.2 F (36.8 C) 98.5 F (36.9 C) 97.9 F (36.6  C) 98.2 F (36.8 C)  TempSrc:   Oral Oral  SpO2: 100% 100% 100% 99%  Weight:      Height:        Intake/Output Summary (Last 24 hours) at 04/14/16 0815 Last data filed at 04/14/16 0600  Gross per 24 hour  Intake          2778.75 ml  Output              730 ml  Net          2048.75 ml   Filed Weights   04/11/16 0057  Weight: 210 lb (95.3 kg)    Examination:  General exam: Lying on right sided, Appears calm and comfortable  Respiratory system: Clear to auscultation. Respiratory effort normal. No  wheezing Cardiovascular system: S1 & S2 heard, RR, NR. No murmurs.   No pedal edema. Gastrointestinal system: Abdomen is nondistended, soft and nontender. +BS Central nervous system: Alert and oriented. No focal neurological deficits. GU: wound vac covering upper gluteal cleft with clear dressing only.  No surrounding erythema or pain to palpation.   Skin: No rashes.  Pelvic wound with clear dressing over.  Wound vac passing displaced.   Psychiatry: Judgement and insight appear normal. Mood & affect appropriate.     Data Reviewed: I have personally reviewed following labs and imaging studies  CBC:  Recent Labs Lab 04/10/16 1125 04/11/16 0045 04/11/16 0057 04/11/16 1604 04/13/16 0555 04/14/16 0540  WBC 8.0 9.9  --  9.6 5.1 7.6  NEUTROABS 5,680 6.0  --   --   --   --   HGB 11.0* 10.1* 9.9* 10.6* 10.7* 11.1*  HCT 34.0* 29.3* 29.0* 30.7* 31.1* 32.2*  MCV 86.1 82.1  --  82.5 81.8 80.7  PLT 351 303  --  268 296 789   Basic Metabolic Panel:  Recent Labs Lab 04/10/16 1125 04/11/16 0045 04/11/16 0057 04/11/16 1604 04/13/16 0555 04/14/16 0540  NA 134* 133* 136 138 139 140  K 3.5 3.7 3.8 3.2* 4.2 4.3  CL 98 98* 95* 104 103 103  CO2 28 25  --  27 28 28   GLUCOSE 323* 418* 430* 94 288* 259*  BUN 9 11 10 7 7 7   CREATININE 0.73 0.93 0.80 0.63 0.75 0.82  CALCIUM 8.8 8.8*  --  8.3* 8.9 9.1  MG  --   --   --   --  1.9  --    GFR: Estimated Creatinine Clearance: 146.3 mL/min (by C-G formula based on SCr of 0.82 mg/dL). Liver Function Tests:  Recent Labs Lab 04/11/16 0045  AST 18  ALT 10*  ALKPHOS 97  BILITOT 0.3  PROT 6.4*  ALBUMIN 3.1*   Coagulation Profile:  Recent Labs Lab 04/11/16 0330  INR 0.97   HbA1C:  Recent Labs  04/11/16 1604  HGBA1C 10.5*   CBG:  Recent Labs Lab 04/13/16 0804 04/13/16 1108 04/13/16 1713 04/13/16 2128 04/14/16 0744  GLUCAP 300* 291* 139* 337* 297*   Anemia Panel:  Recent Labs  04/13/16 0827  FERRITIN 225  TIBC 188*    IRON 31*   Sepsis Labs:  Recent Labs Lab 04/11/16 0102 04/11/16 0330 04/11/16 0343 04/11/16 1604  PROCALCITON  --  <0.10  --   --   LATICACIDVEN 2.75* 2.1* 2.19* 1.2    Recent Results (from the past 240 hour(s))  Blood Culture (routine x 2)     Status: None (Preliminary result)   Collection Time: 04/10/16 12:45 AM  Result  Value Ref Range Status   Specimen Description BLOOD LEFT WRIST  Final   Special Requests BOTTLES DRAWN AEROBIC AND ANAEROBIC 5ML  Final   Culture   Final    NO GROWTH 2 DAYS Performed at [facility], [address], [place], [place] [address]    Report Status PENDING  Incomplete  Blood Culture (routine x 2)     Status: None (Preliminary result)   Collection Time: 04/11/16 12:35 AM  Result Value Ref Range Status   Specimen Description BLOOD LEFT HAND  Final   Special Requests BOTTLES DRAWN AEROBIC AND ANAEROBIC 5ML  Final   Culture   Final    NO GROWTH 2 DAYS Performed at [facility], [place] [address] [place], [place] [address]    Report Status PENDING  Incomplete  Urine culture     Status: None   Collection Time: 04/11/16  2:27 AM  Result Value Ref Range Status   Specimen Description URINE, RANDOM  Final   Special Requests NONE  Final   Culture   Final    NO GROWTH Performed at [facility], [place] [address], [place], [place] [address]    Report Status 04/12/2016 FINAL  Final  Aerobic Culture (superficial specimen)     Status: None (Preliminary result)   Collection Time: 04/11/16  9:20 AM  Result Value Ref Range Status   Specimen Description ABSCESS RECTAL SWAB  Final   Special Requests NONE  Final   Gram Stain   Final    NO WBC SEEN ABUNDANT GRAM NEGATIVE RODS ABUNDANT GRAM POSITIVE COCCI IN PAIRS    Culture   Final    ABUNDANT STREPTOCOCCUS GROUP F RARE STAPHYLOCOCCUS AUREUS SUSCEPTIBILITIES TO FOLLOW Performed at [facility], [place] [address], [place], [place] [address]    Report Status PENDING  Incomplete   MRSA PCR Screening     Status: None   Collection Time: 04/11/16  5:10 PM  Result Value Ref Range Status   MRSA by PCR NEGATIVE NEGATIVE Final    Comment:        The GeneXpert MRSA Assay (FDA approved for NASAL specimens only), is one component of a comprehensive MRSA colonization surveillance program. It is not intended to diagnose MRSA infection nor to guide or monitor treatment for MRSA infections.        Radiology Studies: Dg Neck Soft Tissue  Result Date: 04/12/2016 CLINICAL DATA:  Neck pain. EXAM: NECK SOFT TISSUES - 1+ VIEW COMPARISON:  None. FINDINGS: Two-view study using soft tissue technique shows no prevertebral soft tissue swelling. No unexpected gas within the prevertebral soft tissues. Epiglottis and area epiglottic folds are unremarkable. Straightening of the normal cervical lordosis is evident. Loss of disc height with endplate spurring seen at C5-6. IMPRESSION: Negative. Electronically Signed   By: Misty Stanley M.D.   On: 04/12/2016 19:47     Scheduled Meds: . cyclobenzaprine  10 mg Oral QHS  . diclofenac sodium  4 g Topical QID  . enoxaparin (LOVENOX) injection  40 mg Subcutaneous Q24H  . insulin aspart  0-15 Units Subcutaneous TID WC  . insulin aspart protamine- aspart  30 Units Subcutaneous BID WC  . piperacillin-tazobactam (ZOSYN)  IV  3.375 g Intravenous Q8H  . potassium chloride  40 mEq Oral Q12H  . sodium chloride flush  3 mL Intravenous Q12H  . vancomycin  1,000 mg Intravenous Q8H   Continuous Infusions: . sodium chloride 75 mL/hr at 04/13/16 2126  LOS: 3 days    Time spent: 45 minutes    Gilles Chiquito, MD [facility] [phone]  If 7PM-7AM, please contact night-coverage [email or web address] Password [email or web address] 04/14/2016, 8:15 AM

## 2016-04-14 NOTE — Progress Notes (Signed)
Patient ID: [name] [name], male   DOB: 1985/06/04, 31 y.o.   MRN: [id number] 1 Day Post-Op   Subjective: complaints this morning. States the area of the wound feels better than admission.  Objective: Vital signs in last 24 hours: Temp:  [97.9 F (36.6 C)-98.5 F (36.9 C)] 98.2 F (36.8 C) (01/27 0551) Pulse Rate:  [79-95] 87 (01/27 0551) Resp:  [12-20] 18 (01/27 0551) BP: (117-157)/(78-109) 152/109 (01/27 0551) SpO2:  [99 %-100 %] 99 % (01/27 0551) Last BM Date: 04/12/16  Intake/Output from previous day: 01/26 0701 - 01/27 0700 In: 2778.8 [I.V.:1778.8; IV Piggyback:1000] Out: 730 [Urine:725; Blood:5] Intake/Output this shift: No intake/output data recorded.  General appearance: alert, cooperative and no distress Incision/Wound: VAC dressing in place and appears intact with slight cloudy drainage. No apparent surrounding erythema.  Lab Results:   Recent Labs  04/13/16 0555 04/14/16 0540  WBC 5.1 7.6  HGB 10.7* 11.1*  HCT 31.1* 32.2*  PLT 296 378   BMET  Recent Labs  04/13/16 0555 04/14/16 0540  NA 139 140  K 4.2 4.3  CL 103 103  CO2 28 28  GLUCOSE 288* 259*  BUN 7 7  CREATININE 0.75 0.82  CALCIUM 8.9 9.1     Studies/Results: Dg Neck Soft Tissue  Result Date: 04/12/2016 CLINICAL DATA:  Neck pain. EXAM: NECK SOFT TISSUES - 1+ VIEW COMPARISON:  None. FINDINGS: Two-view study using soft tissue technique shows no prevertebral soft tissue swelling. No unexpected gas within the prevertebral soft tissues. Epiglottis and area epiglottic folds are unremarkable. Straightening of the normal cervical lordosis is evident. Loss of disc height with endplate spurring seen at C5-6. IMPRESSION: Negative. Electronically Signed   By: Misty Stanley M.D.   On: 04/12/2016 19:47   Ct Cervical Spine Wo Contrast  Result Date: 04/14/2016 CLINICAL DATA:  Evaluate C5-6 narrowing reported on prior DG imaging of neck. EXAM: CT CERVICAL SPINE WITHOUT CONTRAST TECHNIQUE: Multidetector CT  imaging of the cervical spine was performed without intravenous contrast. Multiplanar CT image reconstructions were also generated. COMPARISON:  04/12/2016 FINDINGS: Alignment: Normal. Skull base and vertebrae: No acute fracture. No primary bone lesion or focal pathologic process. Soft tissues and spinal canal: No prevertebral fluid or swelling. No visible canal hematoma. Disc levels: Mild loss of disc height at C5-C6 and C6-C7. Endplate spurring and mild spondylotic disc bulging is noted at C5-C6. Uncovertebral spurring on the left at C4-C5 leads to mild neural foraminal narrowing. No other significant stenosis. The central spinal canal is well preserved. No evidence of a disc herniation. Upper chest: Unremarkable. Other: None. IMPRESSION: 1. No fracture or acute finding. 2. Disc degenerative changes with loss of disc height at C5-C6 and C6-C7, most prominent at C5-C6. 3. Uncovertebral spurring with mild left neural foraminal narrowing at C4-C5. 4. No convincing disc herniation. Electronically Signed   By: Lajean Manes M.D.   On: 04/14/2016 08:13    Anti-infectives: Anti-infectives    Start     Dose/Rate Route Frequency Ordered Stop   04/11/16 0600  piperacillin-tazobactam (ZOSYN) IVPB 3.375 g  Status:  Discontinued     3.375 g 100 mL/hr over 30 Minutes Intravenous Every 8 hours 04/11/16 0236 04/11/16 0239   04/11/16 0600  piperacillin-tazobactam (ZOSYN) IVPB 3.375 g     3.375 g 12.5 mL/hr over 240 Minutes Intravenous Every 8 hours 04/11/16 0241     04/11/16 0600  vancomycin (VANCOCIN) IVPB 1000 mg/200 mL premix     1,000 mg 200 mL/hr over 60 Minutes Intravenous  Every 8 hours 04/11/16 0315     04/11/16 0000  piperacillin-tazobactam (ZOSYN) IVPB 3.375 g     3.375 g 100 mL/hr over 30 Minutes Intravenous  Once 04/10/16 2353 04/11/16 0140   04/11/16 0000  vancomycin (VANCOCIN) IVPB 1000 mg/200 mL premix     1,000 mg 200 mL/hr over 60 Minutes Intravenous  Once 04/10/16 2353 04/11/16 0250       Assessment/Plan: s/p Procedure(s): Incision and drainage of sacral abscess. VAC dressing change yesterday. Appears to be improving. Will need dressing change on Monday and could plan to do that at bedside. Continue current treatment.    LOS: 3 days    Grayling Schranz T 04/14/2016

## 2016-04-14 NOTE — Progress Notes (Signed)
Patient's wound VAC leaking after patient returned from CT.  Discussed with Dr. Excell Seltzer and Maudie Flakes WOCN and will d/c wound VAC and pack wound moist gauze for the time being.  Patient for possible replacement of wound VAC in OR tomorrow.

## 2016-04-15 LAB — CBC
HEMATOCRIT: 30.9 % — AB (ref 39.0–52.0)
HEMOGLOBIN: 10.6 g/dL — AB (ref 13.0–17.0)
MCH: 28 pg (ref 26.0–34.0)
MCHC: 34.3 g/dL (ref 30.0–36.0)
MCV: 81.7 fL (ref 78.0–100.0)
Platelets: 324 10*3/uL (ref 150–400)
RBC: 3.78 MIL/uL — ABNORMAL LOW (ref 4.22–5.81)
RDW: 12.7 % (ref 11.5–15.5)
WBC: 7.3 10*3/uL (ref 4.0–10.5)

## 2016-04-15 LAB — BASIC METABOLIC PANEL
ANION GAP: 7 (ref 5–15)
BUN: 10 mg/dL (ref 6–20)
CO2: 29 mmol/L (ref 22–32)
Calcium: 8.8 mg/dL — ABNORMAL LOW (ref 8.9–10.3)
Chloride: 99 mmol/L — ABNORMAL LOW (ref 101–111)
Creatinine, Ser: 0.77 mg/dL (ref 0.61–1.24)
GFR calc Af Amer: >60 mL/min (ref >60)
GFR calc non Af Amer: >60 mL/min (ref >60)
GLUCOSE: 285 mg/dL — AB (ref 65–99)
POTASSIUM: 3.9 mmol/L (ref 3.5–5.1)
Sodium: 135 mmol/L (ref 135–145)

## 2016-04-15 LAB — AEROBIC CULTURE W GRAM STAIN (SUPERFICIAL SPECIMEN)

## 2016-04-15 LAB — GLUCOSE, CAPILLARY
GLUCOSE-CAPILLARY: 265 mg/dL — AB (ref 65–99)
GLUCOSE-CAPILLARY: 247 mg/dL — AB (ref 65–99)
GLUCOSE-CAPILLARY: 119 mg/dL — AB (ref 65–99)
GLUCOSE-CAPILLARY: 211 mg/dL — AB (ref 65–99)
GLUCOSE-CAPILLARY: 238 mg/dL — AB (ref 65–99)

## 2016-04-15 LAB — AEROBIC CULTURE  (SUPERFICIAL SPECIMEN): GRAM STAIN: NONE SEEN

## 2016-04-15 MED ORDER — INSULIN ASPART PROT & ASPART (70-30 MIX) 100 UNIT/ML ~~LOC~~ SUSP
35.0000 [IU] | Freq: Two times a day (BID) | SUBCUTANEOUS | Status: DC
  Administered 2016-04-15 – 2016-04-16 (×2): 35 [IU] via SUBCUTANEOUS

## 2016-04-15 NOTE — Progress Notes (Signed)
Patient ID: [name] [name], male   DOB: May 22, 1985, 31 y.o.   MRN: [id number] 2 Days Post-Op   Subjective: States he is doing okay. Some pain at the wound site but gradually improving.  Objective: Vital signs in last 24 hours: Temp:  [97.4 F (36.3 C)-97.9 F (36.6 C)] 97.5 F (36.4 C) (01/28 0617) Pulse Rate:  [81-98] 81 (01/28 0617) Resp:  [16-18] 16 (01/28 0617) BP: (124-145)/(82-108) 141/96 (01/28 0617) SpO2:  [99 %-100 %] 99 % (01/28 0617) Last BM Date: 04/14/16  Intake/Output from previous day: 01/27 0701 - 01/28 0700 In: 3270 [P.O.:720; I.V.:1800; IV Piggyback:750] Out: 750 [Urine:750] Intake/Output this shift: No intake/output data recorded.  General appearance: alert, cooperative and no distress Incision/Wound: Dressings all changed at the bedside. Larger lower midline wound very clean without unusual drainage. Smaller I and D site on the right side and superior has some slight purulent drainage. No erythema or swelling.  Lab Results:   Recent Labs  04/14/16 0540 04/15/16 0631  WBC 7.6 7.3  HGB 11.1* 10.6*  HCT 32.2* 30.9*  PLT 378 324   BMET  Recent Labs  04/14/16 0540 04/15/16 0631  NA 140 135  K 4.3 3.9  CL 103 99*  CO2 28 29  GLUCOSE 259* 285*  BUN 7 10  CREATININE 0.82 0.77  CALCIUM 9.1 8.8*     Studies/Results: Ct Cervical Spine Wo Contrast  Result Date: 04/14/2016 CLINICAL DATA:  Evaluate C5-6 narrowing reported on prior DG imaging of neck. EXAM: CT CERVICAL SPINE WITHOUT CONTRAST TECHNIQUE: Multidetector CT imaging of the cervical spine was performed without intravenous contrast. Multiplanar CT image reconstructions were also generated. COMPARISON:  04/12/2016 FINDINGS: Alignment: Normal. Skull base and vertebrae: No acute fracture. No primary bone lesion or focal pathologic process. Soft tissues and spinal canal: No prevertebral fluid or swelling. No visible canal hematoma. Disc levels: Mild loss of disc height at C5-C6 and C6-C7. Endplate  spurring and mild spondylotic disc bulging is noted at C5-C6. Uncovertebral spurring on the left at C4-C5 leads to mild neural foraminal narrowing. No other significant stenosis. The central spinal canal is well preserved. No evidence of a disc herniation. Upper chest: Unremarkable. Other: None. IMPRESSION: 1. No fracture or acute finding. 2. Disc degenerative changes with loss of disc height at C5-C6 and C6-C7, most prominent at C5-C6. 3. Uncovertebral spurring with mild left neural foraminal narrowing at C4-C5. 4. No convincing disc herniation. Electronically Signed   By: Lajean Manes M.D.   On: 04/14/2016 08:13    Anti-infectives: Anti-infectives    Start     Dose/Rate Route Frequency Ordered Stop   04/11/16 0600  piperacillin-tazobactam (ZOSYN) IVPB 3.375 g  Status:  Discontinued     3.375 g 100 mL/hr over 30 Minutes Intravenous Every 8 hours 04/11/16 0236 04/11/16 0239   04/11/16 0600  piperacillin-tazobactam (ZOSYN) IVPB 3.375 g     3.375 g 12.5 mL/hr over 240 Minutes Intravenous Every 8 hours 04/11/16 0241     04/11/16 0600  vancomycin (VANCOCIN) IVPB 1000 mg/200 mL premix     1,000 mg 200 mL/hr over 60 Minutes Intravenous Every 8 hours 04/11/16 0315     04/11/16 0000  piperacillin-tazobactam (ZOSYN) IVPB 3.375 g     3.375 g 100 mL/hr over 30 Minutes Intravenous  Once 04/10/16 2353 04/11/16 0140   04/11/16 0000  vancomycin (VANCOCIN) IVPB 1000 mg/200 mL premix     1,000 mg 200 mL/hr over 60 Minutes Intravenous  Once 04/10/16 2353 04/11/16 0250  Assessment/Plan: s/p Procedure(s): 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 VAC dressing I do not think will stay intact in this area. Start saline wound packing. Continue antibiotics for now.    LOS: 4 days    Lovinia Snare T 04/15/2016

## 2016-04-15 NOTE — Progress Notes (Signed)
PROGRESS NOTE    [name] [name]  AUQ:[id number] DOB: 12/12/1985 DOA: 04/10/2016 PCP: Maren Reamer, MD    Brief Narrative:  31 y.o.malewith history of poorly controlled diabetes, HTN, and asthma who presentedwith concern for a wound infection. He iss/p sacral abscess excision on 02/24/2016. He completed antibiotic treatment and was supposed to have a follow up appt w/wound care for a wound vac placement but he was unable to keep this appointment. He presented to the ED c/o worsening pain over the sacral wound area w/ bloody drainage on 04/11/16. CT abdomen/pelvis in the ED showed chronic inflammation with likely a new evolving abscess over the right buttock.  Pt had I&D with Vac placement- 1/24 Pt went to OR for surgical debridement 1/26   Assessment & Plan:   Pelvic abscess in male  - He has had a complicated history with I&D on 1/24, OR on 1/26 with wound vac placement.  Wound vac displaced and now only doing dressing changes - Continue vanco and Zosyn as GNR seen on culture (Eikenella) and uncontrolled DM.  - Cultures:  BC X 2 1/23 NGTD; WC 1/24 showed abundant strep group F and some staph aureus rare eikenella, gram stain with GNR and GPC.  Fully speciated as MRSA with group F strep and eikenella  - WBC 7.3 - Surgery team following, reviewed note today - Zofran for nausea - D/C IVF  Uncontrolled diabetes mellitus  - A1C 10.5 - 24 hour insulin need: 19 units from SSI - Insulin 70/30 increased to 35 Units BID - SSI moderate    Normocytic Anemia - Present since last summer, likely related to ongoing infection - Iron low, saturation low, ferritin inappropriate normal (acute phase reactant) - Iron started with colace, he reports BM yesterday - Recheck once acute illness resolved    Asthma - Home albuterol  HTN  - Somewhat improved on amlodipine - Monitor for another day and increase to 10mg  if needed  Neck Pain - CT neck showed DDD - continue voltaren gel, flexeril  and ibuprofen/tylenol, norco  DVT prophylaxis: lovenox Code Status: Full Family Communication: Wife at bedside Disposition Plan: Pending surgical needs, IV Abx, wound care needs.  Discharge in 2-3 days   Consultants:   Surgery  Procedures:  I&D with Vac placement- 1/24 OR for surgical debridement 1/26   Antimicrobials:  Vancomycin - 1/24 --> Zosyn- 1/24  -->   Subjective: Patient reports no complaints this morning. Doing well.  Last BM yesterday.  Family is concerned about his medicaid application.  I discussed with SW and she advised to speak to CM on a weekday for assistance as medicaid office would be closed today.   Objective: Vitals:   04/14/16 0551 04/14/16 1400 04/14/16 2355 04/15/16 0617  BP: (!) 152/109 (!) 145/108 124/82 (!) 141/96  Pulse: 87 98 95 81  Resp: 18 18 18 16   Temp: 98.2 F (36.8 C) 97.4 F (36.3 C) 97.9 F (36.6 C) 97.5 F (36.4 C)  TempSrc: Oral Oral Oral Oral  SpO2: 99% 100% 99% 99%  Weight:      Height:        Intake/Output Summary (Last 24 hours) at 04/15/16 0724 Last data filed at 04/15/16 0617  Gross per 24 hour  Intake             3270 ml  Output              750 ml  Net  2520 ml   Filed Weights   04/11/16 0057  Weight: 210 lb (95.3 kg)    Examination:  General exam: Lying on back, somewhat uncomfortable on movement.  Respiratory system: Clear to auscultation. Respiratory effort normal. No wheezing Cardiovascular system: S1 & S2 heard, RR, NR. No murmurs.   No pedal edema. Gastrointestinal system: Abdomen is ND, soft and NT. +BS GU: dressing covering pelvic wound Skin: No rashes noted.   Psychiatry: Judgement and insight appear normal. Mood & affect appropriate.     Data Reviewed: I have personally reviewed following labs and imaging studies  CBC:  Recent Labs Lab 04/10/16 1125 04/11/16 0045 04/11/16 0057 04/11/16 1604 04/13/16 0555 04/14/16 0540 04/15/16 0631  WBC 8.0 9.9  --  9.6 5.1 7.6 7.3    NEUTROABS 5,680 6.0  --   --   --   --   --   HGB 11.0* 10.1* 9.9* 10.6* 10.7* 11.1* 10.6*  HCT 34.0* 29.3* 29.0* 30.7* 31.1* 32.2* 30.9*  MCV 86.1 82.1  --  82.5 81.8 80.7 81.7  PLT 351 303  --  268 296 378 481   Basic Metabolic Panel:  Recent Labs Lab 04/11/16 0045 04/11/16 0057 04/11/16 1604 04/13/16 0555 04/14/16 0540 04/15/16 0631  NA 133* 136 138 139 140 135  K 3.7 3.8 3.2* 4.2 4.3 3.9  CL 98* 95* 104 103 103 99*  CO2 25  --  27 28 28 29   GLUCOSE 418* 430* 94 288* 259* 285*  BUN 11 10 7 7 7 10   CREATININE 0.93 0.80 0.63 0.75 0.82 0.77  CALCIUM 8.8*  --  8.3* 8.9 9.1 8.8*  MG  --   --   --  1.9  --   --    GFR: Estimated Creatinine Clearance: 149.9 mL/min (by C-G formula based on SCr of 0.77 mg/dL). Liver Function Tests:  Recent Labs Lab 04/11/16 0045  AST 18  ALT 10*  ALKPHOS 97  BILITOT 0.3  PROT 6.4*  ALBUMIN 3.1*   Coagulation Profile:  Recent Labs Lab 04/11/16 0330  INR 0.97   HbA1C: No results for input(s): HGBA1C in the last 72 hours. CBG:  Recent Labs Lab 04/13/16 2128 04/14/16 0744 04/14/16 1224 04/14/16 1703 04/15/16 0000  GLUCAP 337* 297* 298* 186* 217*   Anemia Panel:  Recent Labs  04/13/16 0827  FERRITIN 225  TIBC 188*  IRON 31*   Sepsis Labs:  Recent Labs Lab 04/11/16 0102 04/11/16 0330 04/11/16 0343 04/11/16 1604  PROCALCITON  --  <0.10  --   --   LATICACIDVEN 2.75* 2.1* 2.19* 1.2    Recent Results (from the past 240 hour(s))  Blood Culture (routine x 2)     Status: None (Preliminary result)   Collection Time: 04/10/16 12:45 AM  Result Value Ref Range Status   Specimen Description BLOOD LEFT WRIST  Final   Special Requests BOTTLES DRAWN AEROBIC AND ANAEROBIC 5ML  Final   Culture   Final    NO GROWTH 3 DAYS Performed at [facility], [place] [address], [place], [place] [address]    Report Status PENDING  Incomplete  Blood Culture (routine x 2)     Status: None (Preliminary result)   Collection Time:  04/11/16 12:35 AM  Result Value Ref Range Status   Specimen Description BLOOD LEFT HAND  Final   Special Requests BOTTLES DRAWN AEROBIC AND ANAEROBIC 5ML  Final   Culture   Final    NO GROWTH 3 DAYS Performed  at [facility], [place] [address], [place], [place] [address]    Report Status PENDING  Incomplete  Urine culture     Status: None   Collection Time: 04/11/16  2:27 AM  Result Value Ref Range Status   Specimen Description URINE, RANDOM  Final   Special Requests NONE  Final   Culture   Final    NO GROWTH Performed at [facility], [place] [address] [place], [place] [address]    Report Status 04/12/2016 FINAL  Final  Aerobic Culture (superficial specimen)     Status: None (Preliminary result)   Collection Time: 04/11/16  9:20 AM  Result Value Ref Range Status   Specimen Description ABSCESS RECTAL SWAB  Final   Special Requests NONE  Final   Gram Stain   Final    NO WBC SEEN ABUNDANT GRAM NEGATIVE RODS ABUNDANT GRAM POSITIVE COCCI IN PAIRS Performed at [facility], [address] [place], [place] [address]    Culture   Final    ABUNDANT STREPTOCOCCUS GROUP F RARE METHICILLIN RESISTANT STAPHYLOCOCCUS AUREUS FEW EIKENELLA CORRODENS    Report Status PENDING  Incomplete   Organism ID, Bacteria METHICILLIN RESISTANT STAPHYLOCOCCUS AUREUS  Final      Susceptibility   Methicillin resistant staphylococcus aureus - MIC*    CIPROFLOXACIN <=0.5 SENSITIVE Sensitive     ERYTHROMYCIN >=8 RESISTANT Resistant     GENTAMICIN <=0.5 SENSITIVE Sensitive     OXACILLIN >=4 RESISTANT Resistant     TETRACYCLINE <=1 SENSITIVE Sensitive     VANCOMYCIN <=0.5 SENSITIVE Sensitive     TRIMETH/SULFA <=10 SENSITIVE Sensitive     CLINDAMYCIN <=0.25 SENSITIVE Sensitive     RIFAMPIN <=0.5 SENSITIVE Sensitive     Inducible Clindamycin NEGATIVE Sensitive     * RARE METHICILLIN RESISTANT STAPHYLOCOCCUS AUREUS  MRSA PCR Screening     Status: None   Collection Time: 04/11/16  5:10 PM    Result Value Ref Range Status   MRSA by PCR NEGATIVE NEGATIVE Final    Comment:        The GeneXpert MRSA Assay (FDA approved for NASAL specimens only), is one component of a comprehensive MRSA colonization surveillance program. It is not intended to diagnose MRSA infection nor to guide or monitor treatment for MRSA infections.        Radiology Studies: Ct Cervical Spine Wo Contrast  Result Date: 04/14/2016 CLINICAL DATA:  Evaluate C5-6 narrowing reported on prior DG imaging of neck. EXAM: CT CERVICAL SPINE WITHOUT CONTRAST TECHNIQUE: Multidetector CT imaging of the cervical spine was performed without intravenous contrast. Multiplanar CT image reconstructions were also generated. COMPARISON:  04/12/2016 FINDINGS: Alignment: Normal. Skull base and vertebrae: No acute fracture. No primary bone lesion or focal pathologic process. Soft tissues and spinal canal: No prevertebral fluid or swelling. No visible canal hematoma. Disc levels: Mild loss of disc height at C5-C6 and C6-C7. Endplate spurring and mild spondylotic disc bulging is noted at C5-C6. Uncovertebral spurring on the left at C4-C5 leads to mild neural foraminal narrowing. No other significant stenosis. The central spinal canal is well preserved. No evidence of a disc herniation. Upper chest: Unremarkable. Other: None. IMPRESSION: 1. No fracture or acute finding. 2. Disc degenerative changes with loss of disc height at C5-C6 and C6-C7, most prominent at C5-C6. 3. Uncovertebral spurring with mild left neural foraminal narrowing at C4-C5. 4. No convincing disc herniation. Electronically Signed   By: Lajean Manes M.D.   On: 04/14/2016 08:13  Scheduled Meds: . amLODipine  5 mg Oral Daily  . cyclobenzaprine  10 mg Oral QHS  . diclofenac sodium  4 g Topical QID  . enoxaparin (LOVENOX) injection  40 mg Subcutaneous Q24H  . ferrous sulfate  325 mg Oral Q breakfast  . insulin aspart  0-15 Units Subcutaneous TID WC  . insulin aspart  protamine- aspart  30 Units Subcutaneous BID WC  . piperacillin-tazobactam (ZOSYN)  IV  3.375 g Intravenous Q8H  . sodium chloride flush  3 mL Intravenous Q12H  . vancomycin  1,000 mg Intravenous Q8H   Continuous Infusions: . sodium chloride 75 mL/hr at 04/13/16 2126     LOS: 4 days    Time spent: 45 minutes    Gilles Chiquito, MD [facility] [phone]  If 7PM-7AM, please contact night-coverage [email or web address] Password [facility] 04/15/2016, 7:24 AM

## 2016-04-15 NOTE — Progress Notes (Signed)
Pharmacy Antibiotic Note  [name] [name] is a 31 y.o. male admitted on 04/10/2016 with sepsis and cellulitis.  I&D of abscess and debridement of dead fascia and placement of VAC by surgery on 1/24. Pharmacy has been consulted for Vancomycin and Zosyn dosing.  Today, 04/15/2016 Day #5 antibiotics Afebrile WBC wnl SCr stable Wound culture ==> few MRSA  Plan: Continue Vancomycin 1g IV q8h. No adjustments to Zosyn 3.375g IV q8h (4 hour infusion time).  Recheck vancomycin trough and SCr tomorrow AM to rule out accumulation  Height: 5\' 7"  (170.2 cm) Weight: 210 lb (95.3 kg) IBW/kg (Calculated) : 66.1  Temp (24hrs), Avg:97.6 F (36.4 C), Min:97.4 F (36.3 C), Max:97.9 F (36.6 C)   Recent Labs Lab 04/11/16 0045 04/11/16 0057 04/11/16 0102 04/11/16 0330 04/11/16 0343 04/11/16 1604 04/12/16 2104 04/13/16 0555 04/14/16 0540 04/15/16 0631  WBC 9.9  --   --   --   --  9.6  --  5.1 7.6 7.3  CREATININE 0.93 0.80  --   --   --  0.63  --  0.75 0.82 0.77  LATICACIDVEN  --   --  2.75* 2.1* 2.19* 1.2  --   --   --   --   VANCOTROUGH  --   --   --   --   --   --  17  --   --   --     Estimated Creatinine Clearance: 149.9 mL/min (by C-G formula based on SCr of 0.77 mg/dL).    No Known Allergies  Antimicrobials this admission: 1/24 vancomycin >>  1/24 Zosyn >>   Dose adjustments this admission: 1/25 2100 VT = 17 on 1g q8h 1/29 0530 VT = ___ on 1g q8h  Microbiology results: (Dec 2017 sacral wound with GBS and E faecalis S-Amp) 1/24 BCx: ngtd 1/24 UCx: NGF 1/24 MRSA PCR: neg 1/24 Abscess, rectal swab:  Abundant Strep Group F, Rare MRSA   Thank you for allowing pharmacy to be a part of this patient's care.  Peggyann Juba, PharmD, BCPS Pager: [phone] 04/15/2016 10:41 AM

## 2016-04-16 ENCOUNTER — Encounter (HOSPITAL_COMMUNITY): Payer: Self-pay | Admitting: Surgery

## 2016-04-16 LAB — CULTURE, BLOOD (ROUTINE X 2)
CULTURE: NO GROWTH
CULTURE: NO GROWTH

## 2016-04-16 LAB — BASIC METABOLIC PANEL
Anion gap: 7 (ref 5–15)
BUN: 12 mg/dL (ref 6–20)
CALCIUM: 9.4 mg/dL (ref 8.9–10.3)
CO2: 31 mmol/L (ref 22–32)
CREATININE: 0.98 mg/dL (ref 0.61–1.24)
Chloride: 100 mmol/L — ABNORMAL LOW (ref 101–111)
GFR calc non Af Amer: >60 mL/min (ref >60)
GLUCOSE: 208 mg/dL — AB (ref 65–99)
Potassium: 3.6 mmol/L (ref 3.5–5.1)
Sodium: 138 mmol/L (ref 135–145)

## 2016-04-16 LAB — GLUCOSE, CAPILLARY
GLUCOSE-CAPILLARY: 140 mg/dL — AB (ref 65–99)
GLUCOSE-CAPILLARY: 209 mg/dL — AB (ref 65–99)
GLUCOSE-CAPILLARY: 91 mg/dL (ref 65–99)
Glucose-Capillary: 240 mg/dL — ABNORMAL HIGH (ref 65–99)
Glucose-Capillary: 296 mg/dL — ABNORMAL HIGH (ref 65–99)

## 2016-04-16 LAB — VANCOMYCIN, TROUGH: VANCOMYCIN TR: 20 ug/mL (ref 15–20)

## 2016-04-16 MED ORDER — INSULIN ASPART PROT & ASPART (70-30 MIX) 100 UNIT/ML ~~LOC~~ SUSP
42.0000 [IU] | Freq: Two times a day (BID) | SUBCUTANEOUS | Status: DC
  Administered 2016-04-16 – 2016-04-17 (×2): 42 [IU] via SUBCUTANEOUS

## 2016-04-16 MED ORDER — VANCOMYCIN HCL IN DEXTROSE 750-5 MG/150ML-% IV SOLN
750.0000 mg | Freq: Three times a day (TID) | INTRAVENOUS | Status: DC
  Administered 2016-04-16 – 2016-04-17 (×3): 750 mg via INTRAVENOUS
  Filled 2016-04-16 (×4): qty 150

## 2016-04-16 NOTE — Progress Notes (Signed)
Pharmacy Antibiotic Note  See previous pharmacy note from yesterday for full details.  In brief, today is day #6 vancomycin and zosyn for pelvic abscess s/p I&D on 1/24, OR on 1/26 with wound vac placement.  Vancomycin trough = 20 mcg/ml on 1g q8h SCr = 0.98  Plan  Decrease vancomycin 750mg  IV q8h to prevent accumulation  Continue Zosyn 3.375gm IV q8h (4hr extended infusions)  Follow up planned length of therapy, recheck trough as needed  Peggyann Juba, PharmD, BCPS Pager: [phone] 04/16/2016 7:37 AM

## 2016-04-16 NOTE — Care Management Note (Signed)
Case Management Note  Patient Details  Name: [name] [name] MRN: [id number] Date of Birth: 1985-09-04  Subjective/Objective: 31 y/o m admitted w/pelvic abscess. From home. No insurance.Has pcp-[facility]-they are following on orange card,& disability. [facility]ce[facility] meds from [facility]. [facility] chosen for [facility] RN-wound care instruction-patient & spouse teachable. [facility] rep Kim aware.Await HHRN,[id number] order.                   Action/Plan:d/c home w/[facility].   Expected Discharge Date:                  Expected Discharge Plan:  [name]  In-House Referral:     Discharge planning Services  CM Consult  Post Acute Care Choice:    Choice offered to:     DME Arranged:    DME Agency:     HH Arranged:    [place] Agency:     Status of Service:  In process, will continue to follow  If discussed at Long Length of Stay Meetings, dates discussed:    Additional Comments:  Dessa Phi, RN 04/16/2016, 1:51 PM

## 2016-04-16 NOTE — Progress Notes (Signed)
[facility] TEAM 1 - Stepdown/ICU TEAM  [name] [name]  ZOX:[id number] DOB: 1985-12-06 DOA: 04/10/2016 PCP: Maren Reamer, MD    Brief Narrative:  31 y.o. male with history of poorly controlled diabetes, HTN, and asthma who presented with concern for a wound infection.  He is s/p sacral abscess excision on 02/24/2016. He completed antibiotic treatment and was supposed to have a follow up appt w/wound care for a wound vac placement but he was unable to keep this appointment. He presented to the ED c/o worsening pain over the sacral wound area w/ bloody drainage.  CT abdomen/pelvis in the ED showed chronic inflammation with likely a new evolving abscess over the right buttock.   Subjective: Resting comfortably in bed.  Denies CP, sob, n/v, or abdom pain.  States pain in sacrum/buttocks is well controlled.    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 - complicated history with I&D on 1/24, OR on 1/26 with wound vac placement > wound vac displaced and now only doing dressing changes - + MRSA culture will trigger automatic ID consult - will defer abx change to ID MD - BC X 2 1/23 NGTD; WC 1/24 showed abundant Strep Group F, MRSA, and Eikenella corrodens - cont broad coverage for now to include Vanc   Uncontrolled DM2 - A1C 10.5 - CBG remains poorly controlled - adjust tx further and follow trend   Normocytic anemia  - Present since last summer, likely related to ongoing infection - Iron low, saturation low, ferritin inappropriate normal (acute phase reactant) - Iron started with colace - Recheck once acute illness resolved  HTN - BP well controlled at this time   Asthma  - quiescent   Neck Pain - CT neck showed DDD - denies signif pain today  - continue voltaren gel, flexeril, ibuprofen/tylenol, norco  DVT prophylaxis: lovenox  Code Status: FULL CODE Family Communication: no family present at time of exam  Disposition Plan:    Consultants:  Gen Surgery   Procedures: 1/24  I & D of abscess; debridement of dead fascia and placement of [facility] 1/26 surgical I & D  Antimicrobials:  Zosyn 1/23 > Vanc 1/23 >  Objective: Blood pressure 121/81, pulse 85, temperature 97.5 F (36.4 C), temperature source Oral, resp. rate 18, height 5\' 7"  (1.702 m), weight 95.3 kg (210 lb), SpO2 97 %.  Intake/Output Summary (Last 24 hours) at 04/16/16 1459 Last data filed at 04/16/16 1100  Gross per 24 hour  Intake             1220 ml  Output                0 ml  Net             1220 ml   Filed Weights   04/11/16 0057  Weight: 95.3 kg (210 lb)    Examination: General: No acute respiratory distress Lungs: Clear to auscultation bilaterally without wheezes or crackles Cardiovascular: Regular rate and rhythm without murmur gallop or rub normal S1 and S2 Abdomen: Nontender, nondistended, soft, bowel sounds positive, no rebound, no ascites, no appreciable mass Extremities: No significant cyanosis, clubbing, or edema bilateral lower extremities Wound:  Inspected by Gen Surgery today    CBC:  Recent Labs Lab 04/10/16 1125 04/11/16 0045 04/11/16 0057 04/11/16 1604 04/13/16 0555 04/14/16 0540 04/15/16 0631  WBC 8.0 9.9  --  9.6 5.1 7.6 7.3  NEUTROABS 5,680 6.0  --   --   --   --   --  HGB 11.0* 10.1* 9.9* 10.6* 10.7* 11.1* 10.6*  HCT 34.0* 29.3* 29.0* 30.7* 31.1* 32.2* 30.9*  MCV 86.1 82.1  --  82.5 81.8 80.7 81.7  PLT 351 303  --  268 296 378 962   Basic Metabolic Panel:  Recent Labs Lab 04/11/16 1604 04/13/16 0555 04/14/16 0540 04/15/16 0631 04/16/16 0507  NA 138 139 140 135 138  K 3.2* 4.2 4.3 3.9 3.6  CL 104 103 103 99* 100*  CO2 27 28 28 29 31   GLUCOSE 94 288* 259* 285* 208*  BUN 7 7 7 10 12   CREATININE 0.63 0.75 0.82 0.77 0.98  CALCIUM 8.3* 8.9 9.1 8.8* 9.4  MG  --  1.9  --   --   --    GFR: Estimated Creatinine Clearance: 122.4 mL/min (by C-G formula based on SCr of 0.98 mg/dL).  Liver  Function Tests:  Recent Labs Lab 04/11/16 0045  AST 18  ALT 10*  ALKPHOS 97  BILITOT 0.3  PROT 6.4*  ALBUMIN 3.1*   Coagulation Profile:  Recent Labs Lab 04/11/16 0330  INR 0.97    HbA1C: Hgb A1c MFr Bld  Date/Time Value Ref Range Status  04/11/2016 04:04 PM 10.5 (H) 4.8 - 5.6 % Final    Comment:    (NOTE)         Pre-diabetes: 5.7 - 6.4         Diabetes: >6.4         Glycemic control for adults with diabetes: <7.0   02/23/2016 12:33 AM 13.7 (H) 4.8 - 5.6 % Final    Comment:    (NOTE)         Pre-diabetes: 5.7 - 6.4         Diabetes: >6.4         Glycemic control for adults with diabetes: <7.0     CBG:  Recent Labs Lab 04/15/16 1656 04/15/16 1919 04/15/16 2223 04/16/16 0848 04/16/16 1233  GLUCAP 119* 211* 238* 240* 296*    Scheduled Meds: . amLODipine  5 mg Oral Daily  . cyclobenzaprine  10 mg Oral QHS  . diclofenac sodium  4 g Topical QID  . enoxaparin (LOVENOX) injection  40 mg Subcutaneous Q24H  . ferrous sulfate  325 mg Oral Q breakfast  . insulin aspart  0-15 Units Subcutaneous TID WC  . insulin aspart protamine- aspart  35 Units Subcutaneous BID WC  . piperacillin-tazobactam (ZOSYN)  IV  3.375 g Intravenous Q8H  . sodium chloride flush  3 mL Intravenous Q12H  . vancomycin  750 mg Intravenous Q8H     LOS: 5 days    Cherene Altes, MD [facility] Office  [phone] Pager - Text Page per Shea Evans as per below:  On-Call/Text Page:      [email or web address]      password [email or web address]  If 7PM-7AM, please contact night-coverage [email or web address] Password [email or web address] 04/16/2016, 2:59 PM

## 2016-04-16 NOTE — Progress Notes (Signed)
Patient ID: [name] [name], male   DOB: Apr 01, 1985, 31 y.o.   MRN: [id number] 3 Days Post-Op   Subjective: No changes. Pain continues to improve.  Objective: Vital signs in last 24 hours: Temp:  [97.5 F (36.4 C)-98.3 F (36.8 C)] 97.5 F (36.4 C) (01/29 0540) Pulse Rate:  [85-94] 85 (01/29 0540) Resp:  [18] 18 (01/29 0540) BP: (121-149)/(81-99) 121/81 (01/29 0540) SpO2:  [97 %-100 %] 97 % (01/29 0540) Last BM Date: 04/14/16  Intake/Output from previous day: 01/28 0701 - 01/29 0700 In: 2063.8 [P.O.:720; I.V.:593.8; IV Piggyback:750] Out: -  Intake/Output this shift: No intake/output data recorded.  General appearance: alert, cooperative and no distress Incision/Wound: Dressings all changed at the bedside. Larger lower midline wound very clean without unusual drainage. Smaller I and D site on the right side and superior has some slight purulent drainage. No erythema or swelling. No tenderness at larger wound, mildly tender at smaller wound.   Lab Results:   Recent Labs  04/14/16 0540 04/15/16 0631  WBC 7.6 7.3  HGB 11.1* 10.6*  HCT 32.2* 30.9*  PLT 378 324   BMET  Recent Labs  04/15/16 0631 04/16/16 0507  NA 135 138  K 3.9 3.6  CL 99* 100*  CO2 29 31  GLUCOSE 285* 208*  BUN 10 12  CREATININE 0.77 0.98  CALCIUM 8.8* 9.4     Studies/Results: No results found.  Anti-infectives: Anti-infectives    Start     Dose/Rate Route Frequency Ordered Stop   04/16/16 1400  vancomycin (VANCOCIN) IVPB 750 mg/150 ml premix     750 mg 150 mL/hr over 60 Minutes Intravenous Every 8 hours 04/16/16 0741     04/11/16 0600  piperacillin-tazobactam (ZOSYN) IVPB 3.375 g  Status:  Discontinued     3.375 g 100 mL/hr over 30 Minutes Intravenous Every 8 hours 04/11/16 0236 04/11/16 0239   04/11/16 0600  piperacillin-tazobactam (ZOSYN) IVPB 3.375 g     3.375 g 12.5 mL/hr over 240 Minutes Intravenous Every 8 hours 04/11/16 0241     04/11/16 0600  vancomycin (VANCOCIN) IVPB 1000  mg/200 mL premix  Status:  Discontinued     1,000 mg 200 mL/hr over 60 Minutes Intravenous Every 8 hours 04/11/16 0315 04/16/16 0741   04/11/16 0000  piperacillin-tazobactam (ZOSYN) IVPB 3.375 g     3.375 g 100 mL/hr over 30 Minutes Intravenous  Once 04/10/16 2353 04/11/16 0140   04/11/16 0000  vancomycin (VANCOCIN) IVPB 1000 mg/200 mL premix     1,000 mg 200 mL/hr over 60 Minutes Intravenous  Once 04/10/16 2353 04/11/16 0250      Assessment/Plan: s/p Procedure(s): 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 Continue wet to dry packing changes. Continue antibiotics for now- ok to transition to PO abx. He will likely need home health for assistance with wound care once he goes home.    LOS: 5 days    [name] [name] 04/16/2016

## 2016-04-16 NOTE — Progress Notes (Signed)
Inpatient Diabetes Program Recommendations  AACE/ADA: New Consensus Statement on Inpatient Glycemic Control (2015)  Target Ranges:  Prepandial:   less than 140 mg/dL      Peak postprandial:   less than 180 mg/dL (1-2 hours)      Critically ill patients:  140 - 180 mg/dL   Lab Results  Component Value Date   GLUCAP 240 (H) 04/16/2016   HGBA1C 10.5 (H) 04/11/2016    Review of Glycemic Control  Continues to need aggressive glycemic control given active infection.  Inpatient Diabetes Program Recommendations:    Increase 70/30 to 40 units bid. Increase Novolog to 0-20 units tidwc and hs.  Will continue to follow. Thank you. Lorenda Peck, RD, LDN, CDE Inpatient Diabetes Coordinator [phone]

## 2016-04-17 DIAGNOSIS — K651 Peritoneal abscess: Secondary | ICD-10-CM

## 2016-04-17 LAB — GLUCOSE, CAPILLARY
GLUCOSE-CAPILLARY: 212 mg/dL — AB (ref 65–99)
Glucose-Capillary: 335 mg/dL — ABNORMAL HIGH (ref 65–99)

## 2016-04-17 MED ORDER — AMOXICILLIN-POT CLAVULANATE 875-125 MG PO TABS
1.0000 | ORAL_TABLET | Freq: Two times a day (BID) | ORAL | Status: DC
  Administered 2016-04-17: 1 via ORAL
  Filled 2016-04-17: qty 1

## 2016-04-17 MED ORDER — AMOXICILLIN-POT CLAVULANATE 875-125 MG PO TABS: 1.0000 | ORAL_TABLET | Freq: Two times a day (BID) | ORAL | 0 refills | Status: DC

## 2016-04-17 MED ORDER — DOXYCYCLINE HYCLATE 100 MG PO TABS
100.0000 mg | ORAL_TABLET | Freq: Two times a day (BID) | ORAL | Status: DC
  Administered 2016-04-17: 100 mg via ORAL
  Filled 2016-04-17: qty 1

## 2016-04-17 MED ORDER — AMLODIPINE BESYLATE 5 MG PO TABS: 5.0000 mg | ORAL_TABLET | Freq: Every day | ORAL | 0 refills | Status: DC

## 2016-04-17 MED ORDER — FERROUS SULFATE 325 (65 FE) MG PO TABS: 325.0000 mg | ORAL_TABLET | Freq: Every day | ORAL | 3 refills | Status: DC

## 2016-04-17 MED ORDER — DOXYCYCLINE HYCLATE 100 MG PO TABS: 100.0000 mg | ORAL_TABLET | Freq: Two times a day (BID) | ORAL | 0 refills | Status: DC

## 2016-04-17 MED ORDER — ACETAMINOPHEN 325 MG PO TABS: 650.0000 mg | ORAL_TABLET | Freq: Four times a day (QID) | ORAL | 0 refills | Status: DC | PRN

## 2016-04-17 MED ORDER — INSULIN ASPART PROT & ASPART (70-30 MIX) 100 UNIT/ML ~~LOC~~ SUSP: 42.0000 [IU] | Freq: Two times a day (BID) | SUBCUTANEOUS | 11 refills | Status: DC

## 2016-04-17 NOTE — Progress Notes (Signed)
Patient ID: [name] [name], male   DOB: [birth year], 31 y.o.   MRN: [id number]  [facility] Surgery Progress Note  4 Days Post-Op  Subjective: No complaints today. States that he was just transitioned to PO antibiotics. Tolerating dressing changes well. Hopes to go home today.  Objective: Vital signs in last 24 hours: Temp:  [97.6 F (36.4 C)-98.1 F (36.7 C)] 97.6 F (36.4 C) (01/30 0507) Pulse Rate:  [86-99] 86 (01/30 0507) Resp:  [18] 18 (01/30 0507) BP: (114-129)/(79-93) 114/79 (01/30 0507) SpO2:  [99 %-100 %] 99 % (01/30 0507) Last BM Date: 04/16/16  Intake/Output from previous day: 01/29 0701 - 01/30 0700 In: 1320 [P.O.:720; IV Piggyback:600] Out: -  Intake/Output this shift: No intake/output data recorded.  PE: Gen:  Alert, NAD, pleasant Pulm:  Effort normal Abd: Soft, NT/ND Sacrum: Larger wound over sacrum very clean with beefy red tissue and no drainage or surrounding erythema. Smaller I&D site on right side with trace purulent drainage but no erythema or fluctuance. Wounds nontender.  Lab Results:   Recent Labs  04/15/16 0631  WBC 7.3  HGB 10.6*  HCT 30.9*  PLT 324   BMET  Recent Labs  04/15/16 0631 04/16/16 0507  NA 135 138  K 3.9 3.6  CL 99* 100*  CO2 29 31  GLUCOSE 285* 208*  BUN 10 12  CREATININE 0.77 0.98  CALCIUM 8.8* 9.4   PT/INR No results for input(s): LABPROT, INR in the last 72 hours. CMP     Component Value Date/Time   NA 138 04/16/2016 0507   K 3.6 04/16/2016 0507   CL 100 (L) 04/16/2016 0507   CO2 31 04/16/2016 0507   GLUCOSE 208 (H) 04/16/2016 0507   BUN 12 04/16/2016 0507   CREATININE 0.98 04/16/2016 0507   CREATININE 0.73 04/10/2016 1125   CALCIUM 9.4 04/16/2016 0507   PROT 6.4 (L) 04/11/2016 0045   ALBUMIN 3.1 (L) 04/11/2016 0045   AST 18 04/11/2016 0045   ALT 10 (L) 04/11/2016 0045   ALKPHOS 97 04/11/2016 0045   BILITOT 0.3 04/11/2016 0045   GFRNONAA >60 04/16/2016 0507   GFRNONAA >89 04/10/2016 1125   GFRAA  >60 04/16/2016 0507   GFRAA >89 04/10/2016 1125   Lipase     Component Value Date/Time   LIPASE 17 03/27/2016 1200       Studies/Results: No results found.  Anti-infectives: Anti-infectives    Start     Dose/Rate Route Frequency Ordered Stop   04/17/16 1000  amoxicillin-clavulanate (AUGMENTIN) 875-125 MG per tablet 1 tablet     1 tablet Oral Every 12 hours 04/17/16 0938     04/17/16 1000  doxycycline (VIBRA-TABS) tablet 100 mg     100 mg Oral Every 12 hours 04/17/16 0938     04/16/16 1400  vancomycin (VANCOCIN) IVPB 750 mg/150 ml premix  Status:  Discontinued     750 mg 150 mL/hr over 60 Minutes Intravenous Every 8 hours 04/16/16 0741 04/17/16 0938   04/11/16 0600  piperacillin-tazobactam (ZOSYN) IVPB 3.375 g  Status:  Discontinued     3.375 g 100 mL/hr over 30 Minutes Intravenous Every 8 hours 04/11/16 0236 04/11/16 0239   04/11/16 0600  piperacillin-tazobactam (ZOSYN) IVPB 3.375 g  Status:  Discontinued     3.375 g 12.5 mL/hr over 240 Minutes Intravenous Every 8 hours 04/11/16 0241 04/17/16 0938   04/11/16 0600  vancomycin (VANCOCIN) IVPB 1000 mg/200 mL premix  Status:  Discontinued     1,000 mg 200 mL/hr  over 60 Minutes Intravenous Every 8 hours 04/11/16 0315 04/16/16 0741   04/11/16 0000  piperacillin-tazobactam (ZOSYN) IVPB 3.375 g     3.375 g 100 mL/hr over 30 Minutes Intravenous  Once 04/10/16 2353 04/11/16 0140   04/11/16 0000  vancomycin (VANCOCIN) IVPB 1000 mg/200 mL premix     1,000 mg 200 mL/hr over 60 Minutes Intravenous  Once 04/10/16 2353 04/11/16 0250       Assessment/Plan Buttocks abscess, nonhealing sacral wound s/p Procedure(s): I&D of recurrent pilonidal or sacral abscess 1/24 Dr. Hassell Done - wounds stable, tolerating dressing changes well - afebrile  DM HTN  ID - vanc/zosyn 1/24>>1/30, augmentin and doxycycline 1/30>> FEN - carb modified VTE - SCDs, lovenox  Plan - No further surgical intervention needed. [place] for discharge from our  standpoint. Continue BID wet to dry dressing changes. HH arranged for wound care. Encourage daily showers between dressing changes. Continue PO antibiotics. Follow-up with Dr. Hassell Done in about 2 weeks.   LOS: 6 days    Jerrye Beavers , [facility] Surgery 04/17/2016, 10:40 AM Pager: [phone] Consults: [phone] Mon-Fri 7:00 am-4:30 pm Sat-Sun 7:00 am-11:30 am

## 2016-04-17 NOTE — Hospital Discharge Follow-Up (Signed)
This Case Manager received communication from Dessa Phi, RN CM that patient is needing a hospital follow-up appointment. Appointment scheduled for 04/25/16 at 0900 with Dr. Janne Napoleon. Will update AVS with appointment. Dessa Phi, RN CM updated.

## 2016-04-17 NOTE — Care Management Note (Signed)
Case Management Note  Patient Details  Name: [name] [name] MRN: [id number] Date of Birth: 1985/10/18  Subjective/Objective: Referral for pcp-patient already follows @ [facility]-patient able to call for pcp f/u appt on own-voiced understanding, he can also get his meds there-patient voiced understanding. [facility] rep Kim aware of [place] orders. No further CM needs.                   Action/Plan:d/c home w/HHC.   Expected Discharge Date:                  Expected Discharge Plan:  [place]  In-House Referral:     Discharge planning Services  CM Consult, [facility]  Post Acute Care Choice:    Choice offered to:  Patient  DME Arranged:    DME Agency:     HH Arranged:  RN, Social Work [employer] Agency:  [place]  Status of Service:  Completed, signed off  If discussed at [employer], dates discussed:    Additional Comments:  Dessa Phi, RN 04/17/2016, 9:23 AM

## 2016-04-17 NOTE — Discharge Summary (Signed)
Physician Discharge Summary  [name] [name] CZY:[id number] DOB: 03/09/1986 DOA: 04/10/2016  PCP: Maren Reamer, MD  Admit date: 04/10/2016 Discharge date: 04/17/2016  Admitted From: Home  Disposition:  Home   Recommendations for Outpatient Follow-up:  1. Follow up with PCP in 1-2 weeks 2. Please obtain BMP/CBC in one week 3. Monitor improvement of open wound.   Home Health: RN, for wound care.   Discharge Condition: Stable.  CODE STATUS: Full code.  Diet recommendation: Carb Modified   Brief/Interim Summary: 31 y.o.malewith history of poorly controlled diabetes, HTN, and asthma who presented with concern for a wound infection.  He iss/p sacral abscess excision on 02/24/2016. He completed antibiotic treatment and was supposed to have a follow up appt w/wound care for a wound vac placement but he was unable to keep this appointment. He presented to the ED c/o worsening pain over the sacral wound area w/ bloody drainage.  CT abdomen/pelvis in the ED showed chronic inflammation with likely a new evolving abscess over the right buttock.    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  [facility] RN arrange for wound care.  Needs follow up with PCP and Sx.   Uncontrolled DM2 - A1C 10.5 - CBG remains poorly controlled - adjust tx further and follow trend -discharge on 70/30  42 BID. Would avoid SSI with 70/30 because of increase risk of hypoglycemia.   Normocytic anemia  - Present since last summer, likely related to ongoing infection - Iron low, saturation low, ferritin inappropriate normal (acute phase reactant) - Iron started with  colace - Recheck once acute illness resolved  HTN - BP well controlled at this time   Asthma  - quiescent   Neck Pain - CT neck showed DDD - denies signif pain today  - continue voltaren gel, flexeril, ibuprofen/tylenol, norco  Discharge Diagnoses:  Principal Problem:   Pelvic abscess in male [facility]) Active Problems:   Uncontrolled diabetes mellitus ([name])   Sepsis, unspecified organism ([name])   Wound infection   Asthma   Neck pain    Discharge Instructions  Discharge Instructions    Diet - low sodium heart healthy    Complete by:  As directed    Increase activity slowly    Complete by:  As directed      Allergies as of 04/17/2016   No Known Allergies     Medication List    STOP taking these medications   naproxen 500 MG tablet Commonly known as:  NAPROSYN   oxyCODONE 5 MG immediate release tablet Commonly known as:  Oxy IR/ROXICODONE     TAKE these medications   acetaminophen 325 MG tablet Commonly known as:  TYLENOL Take 2 tablets (650 mg total) by mouth every 6 (six) hours as needed for mild pain or fever (for pain). What changed:  medication strength  how much to take  reasons to take this   amLODipine 5 MG tablet Commonly known as:  NORVASC Take 1 tablet (5 mg total) by mouth daily.   amoxicillin-clavulanate 875-125 MG tablet Commonly known as:  AUGMENTIN Take 1 tablet by mouth every 12 (  twelve) hours.   doxycycline 100 MG tablet Commonly known as:  VIBRA-TABS Take 1 tablet (100 mg total) by mouth every 12 (twelve) hours.   ferrous sulfate 325 (65 FE) MG tablet Take 1 tablet (325 mg total) by mouth daily with breakfast.   glucose blood test strip Use as instructed   HYDROcodone-acetaminophen 5-325 MG tablet Commonly known as:  NORCO/VICODIN Take 1 tablet by mouth every 6 (six) hours as needed.   insulin aspart protamine- aspart (70-30) 100 UNIT/ML injection Commonly known as:  NOVOLOG MIX 70/30 Inject 0.42 mLs (42 Units total)  into the skin 2 (two) times daily with a meal. What changed:  how much to take   Insulin Syringe 27G X 1/2" 1 ML Misc 1 application by Does not apply route 2 (two) times daily.   TRUE METRIX GO GLUCOSE METER w/Device Kit 1 each by Does not apply route every 8 (eight) hours as needed.   TRUEPLUS LANCETS 26G Misc 1 each by Does not apply route every 8 (eight) hours as needed.      Follow-up Information    Pedro Earls, MD. Call.   Specialty:  General Surgery Why:  We are working on your appointment. Please call to confirm. Contact information: [address] [place] [address] [phone]        [facility] Follow up.   Why:  [place] [place] social work Sport and exercise psychologist information: [address] [place] [address] [phone]        [facility] Follow up on 04/25/2016.   Why:  Hospital follow-up appointment on 04/25/16 at 9:00 am with Dr. Janne Napoleon. Contact information: [place] [id number] [phone]         No Known Allergies  Consultations:  Surgery    Procedures/Studies: Dg Neck Soft Tissue  Result Date: 04/12/2016 CLINICAL DATA:  Neck pain. EXAM: NECK SOFT TISSUES - 1+ VIEW COMPARISON:  None. FINDINGS: Two-view study using soft tissue technique shows no prevertebral soft tissue swelling. No unexpected gas within the prevertebral soft tissues. Epiglottis and area epiglottic folds are unremarkable. Straightening of the normal cervical lordosis is evident. Loss of disc height with endplate spurring seen at C5-6. IMPRESSION: Negative. Electronically Signed   By: Misty Stanley M.D.   On: 04/12/2016 19:47   Ct Cervical Spine Wo Contrast  Result Date: 04/14/2016 CLINICAL DATA:  Evaluate C5-6 narrowing reported on prior DG imaging of neck. EXAM: CT CERVICAL SPINE WITHOUT CONTRAST TECHNIQUE: Multidetector CT imaging of the cervical spine was performed without  intravenous contrast. Multiplanar CT image reconstructions were also generated. COMPARISON:  04/12/2016 FINDINGS: Alignment: Normal. Skull base and vertebrae: No acute fracture. No primary bone lesion or focal pathologic process. Soft tissues and spinal canal: No prevertebral fluid or swelling. No visible canal hematoma. Disc levels: Mild loss of disc height at C5-C6 and C6-C7. Endplate spurring and mild spondylotic disc bulging is noted at C5-C6. Uncovertebral spurring on the left at C4-C5 leads to mild neural foraminal narrowing. No other significant stenosis. The central spinal canal is well preserved. No evidence of a disc herniation. Upper chest: Unremarkable. Other: None. IMPRESSION: 1. No fracture or acute finding. 2. Disc degenerative changes with loss of disc height at C5-C6 and C6-C7, most prominent at C5-C6. 3. Uncovertebral spurring with mild left neural foraminal narrowing at C4-C5. 4. No convincing disc herniation. Electronically Signed   By: Lajean Manes M.D.   On: 04/14/2016 08:13  Ct Pelvis W Contrast  Result Date: 04/11/2016 CLINICAL DATA:  Bloody drainage from site of prior surgery for sacral abscess. Initial encounter. EXAM: CT PELVIS WITH CONTRAST TECHNIQUE: Multidetector CT imaging of the pelvis was performed using the standard protocol following the bolus administration of intravenous contrast. CONTRAST:  100 mL of Isovue 300 IV contrast COMPARISON:  CT of the pelvis performed 02/22/2016 FINDINGS: Urinary Tract: The bladder is mildly distended and grossly unremarkable. Bowel: Visualized small and large bowel loops are grossly unremarkable in appearance. Vascular/Lymphatic: The visualized vasculature is grossly unremarkable in appearance. No retroperitoneal or pelvic sidewall lymphadenopathy is seen. No inguinal lymphadenopathy is appreciated. Reproductive: The prostate remains normal in size. The scrotum is unremarkable in appearance. Other: There is a surgical defect extending nearly to  the level of the coccyx, with air again noted tracking superiorly from this location just superficial to the paraspinal musculature, and new associated soft tissue thickening, reflecting chronic inflammation. At the site of the prior large collection of air along the medial right buttocks, a smaller collection of air and trace fluid is now seen, measuring approximately 7.5 cm, with associated new soft tissue inflammation. More prominent overlying skin thickening is also seen. Musculoskeletal: The visualized musculature is grossly unremarkable. No acute osseous abnormalities are seen. Evaluation for osteomyelitis is limited on CT. IMPRESSION: 1. Surgical defect extending nearly to the level of the coccyx, with air tracking superiorly from this location just superficial to the paraspinal musculature, and new associated soft tissue thickening, reflecting chronic inflammation. Persistent infection cannot be excluded. 2. At the site of the prior large collection of air along the medial right buttock, a smaller collection of air and trace fluid is now seen, measuring approximately 7.5 cm, with new surrounding soft tissue inflammation and thickening, concerning for evolving abscess. More prominent overlying skin thickening noted. These results were called by telephone at the time of interpretation on 04/11/2016 at 1:28 am to Dr. Thayer Jew, who verbally acknowledged these results. Electronically Signed   By: Garald Balding M.D.   On: 04/11/2016 01:31      Subjective: Report pain has been controlled.  He understand that he will need continue local care to the wound and antibiotics.   Discharge Exam: Vitals:   04/16/16 2200 04/17/16 0507  BP: (!) 129/93 114/79  Pulse: 99 86  Resp: 18 18  Temp: 98.1 F (36.7 C) 97.6 F (36.4 C)   Vitals:   04/15/16 2225 04/16/16 0540 04/16/16 2200 04/17/16 0507  BP: (!) 148/90 121/81 (!) 129/93 114/79  Pulse: 94 85 99 86  Resp: '18 18 18 18  ' Temp: 97.8 F (36.6 C)  97.5 F (36.4 C) 98.1 F (36.7 C) 97.6 F (36.4 C)  TempSrc: Oral Oral Oral Oral  SpO2: 100% 97% 100% 99%  Weight:      Height:        General: Pt is alert, awake, not in acute distress Cardiovascular: RRR, S1/S2 +, no rubs, no gallops Respiratory: CTA bilaterally, no wheezing, no rhonchi Abdominal: Soft, NT, ND, bowel sounds + Extremities: no edema, no cyanosis. Skin; buttock wound with dressing.     The results of significant diagnostics from this hospitalization (including imaging, microbiology, ancillary and laboratory) are listed below for reference.     Microbiology: Recent Results (from the past 240 hour(s))  Blood Culture (routine x 2)     Status: None   Collection Time: 04/10/16 12:45 AM  Result Value Ref Range Status   Specimen Description BLOOD LEFT WRIST  Final   Special Requests BOTTLES DRAWN AEROBIC AND ANAEROBIC 5ML  Final   Culture   Final    NO GROWTH 5 DAYS Performed at [facility], [address], [place], [place] [address]    Report Status 04/16/2016 FINAL  Final  Blood Culture (routine x 2)     Status: None   Collection Time: 04/11/16 12:35 AM  Result Value Ref Range Status   Specimen Description BLOOD LEFT HAND  Final   Special Requests BOTTLES DRAWN AEROBIC AND ANAEROBIC 5ML  Final   Culture   Final    NO GROWTH 5 DAYS Performed at [facility], [place] [address], [place], [place] [address]    Report Status 04/16/2016 FINAL  Final  Urine culture     Status: None   Collection Time: 04/11/16  2:27 AM  Result Value Ref Range Status   Specimen Description URINE, RANDOM  Final   Special Requests NONE  Final   Culture   Final    NO GROWTH Performed at [facility], [place] [address] [place], [place] [address]    Report Status 04/12/2016 FINAL  Final  Aerobic Culture (superficial specimen)     Status: None   Collection Time: 04/11/16  9:20 AM  Result Value Ref Range Status   Specimen Description ABSCESS RECTAL SWAB  Final    Special Requests NONE  Final   Gram Stain   Final    NO WBC SEEN ABUNDANT GRAM NEGATIVE RODS ABUNDANT GRAM POSITIVE COCCI IN PAIRS Performed at [facility], [address], [place], [place] [address]    Culture   Final    ABUNDANT STREPTOCOCCUS GROUP F RARE METHICILLIN RESISTANT STAPHYLOCOCCUS AUREUS FEW EIKENELLA CORRODENS    Report Status 04/15/2016 FINAL  Final   Organism ID, Bacteria METHICILLIN RESISTANT STAPHYLOCOCCUS AUREUS  Final      Susceptibility   Methicillin resistant staphylococcus aureus - MIC*    CIPROFLOXACIN <=0.5 SENSITIVE Sensitive     ERYTHROMYCIN >=8 RESISTANT Resistant     GENTAMICIN <=0.5 SENSITIVE Sensitive     OXACILLIN >=4 RESISTANT Resistant     TETRACYCLINE <=1 SENSITIVE Sensitive     VANCOMYCIN <=0.5 SENSITIVE Sensitive     TRIMETH/SULFA <=10 SENSITIVE Sensitive     CLINDAMYCIN <=0.25 SENSITIVE Sensitive     RIFAMPIN <=0.5 SENSITIVE Sensitive     Inducible Clindamycin NEGATIVE Sensitive     * RARE METHICILLIN RESISTANT STAPHYLOCOCCUS AUREUS  MRSA PCR Screening     Status: None   Collection Time: 04/11/16  5:10 PM  Result Value Ref Range Status   MRSA by PCR NEGATIVE NEGATIVE Final    Comment:        The GeneXpert MRSA Assay (FDA approved for NASAL specimens only), is one component of a comprehensive MRSA colonization surveillance program. It is not intended to diagnose MRSA infection nor to guide or monitor treatment for MRSA infections.      Labs: BNP (last 3 results) No results for input(s): BNP in the last 8760 hours. Basic Metabolic Panel:  Recent Labs Lab 04/11/16 1604 04/13/16 0555 04/14/16 0540 04/15/16 0631 04/16/16 0507  NA 138 139 140 135 138  K 3.2* 4.2 4.3 3.9 3.6  CL 104 103 103 99* 100*  CO2 '27 28 28 29 31  ' GLUCOSE 94 288* 259* 285* 208*  BUN '7 7 7 10 12  ' CREATININE 0.63 0.75 0.82 0.77 0.98  CALCIUM 8.3* 8.9 9.1 8.8* 9.4  MG  --  1.9  --   --   --    Liver Function Tests:  Recent Labs Lab  04/11/16 0045  AST 18  ALT 10*  ALKPHOS 97  BILITOT 0.3  PROT 6.4*  ALBUMIN 3.1*   No results for input(s): LIPASE, AMYLASE in the last 168 hours. No results for input(s): AMMONIA in the last 168 hours. CBC:  Recent Labs Lab 04/11/16 0045 04/11/16 0057 04/11/16 1604 04/13/16 0555 04/14/16 0540 04/15/16 0631  WBC 9.9  --  9.6 5.1 7.6 7.3  NEUTROABS 6.0  --   --   --   --   --   HGB 10.1* 9.9* 10.6* 10.7* 11.1* 10.6*  HCT 29.3* 29.0* 30.7* 31.1* 32.2* 30.9*  MCV 82.1  --  82.5 81.8 80.7 81.7  PLT 303  --  268 296 378 324   Cardiac Enzymes: No results for input(s): CKTOTAL, CKMB, CKMBINDEX, TROPONINI in the last 168 hours. BNP: Invalid input(s): POCBNP CBG:  Recent Labs Lab 04/16/16 1233 04/16/16 1722 04/16/16 2156 04/17/16 0740 04/17/16 1200  GLUCAP 296* 209* 91 212* 335*   D-Dimer No results for input(s): DDIMER in the last 72 hours. Hgb A1c No results for input(s): HGBA1C in the last 72 hours. Lipid Profile No results for input(s): CHOL, HDL, LDLCALC, TRIG, CHOLHDL, LDLDIRECT in the last 72 hours. Thyroid function studies No results for input(s): TSH, T4TOTAL, T3FREE, THYROIDAB in the last 72 hours.  Invalid input(s): FREET3 Anemia work up No results for input(s): VITAMINB12, FOLATE, FERRITIN, TIBC, IRON, RETICCTPCT in the last 72 hours. Urinalysis    Component Value Date/Time   COLORURINE YELLOW 04/11/2016 0227   APPEARANCEUR CLEAR 04/11/2016 0227   LABSPEC 1.031 (H) 04/11/2016 0227   PHURINE 6.0 04/11/2016 0227   GLUCOSEU >=500 (A) 04/11/2016 0227   HGBUR NEGATIVE 04/11/2016 0227   BILIRUBINUR NEGATIVE 04/11/2016 0227   KETONESUR NEGATIVE 04/11/2016 0227   PROTEINUR NEGATIVE 04/11/2016 0227   NITRITE NEGATIVE 04/11/2016 0227   LEUKOCYTESUR NEGATIVE 04/11/2016 0227   Sepsis Labs Invalid input(s): PROCALCITONIN,  WBC,  LACTICIDVEN Microbiology Recent Results (from the past 240 hour(s))  Blood Culture (routine x 2)     Status: None    Collection Time: 04/10/16 12:45 AM  Result Value Ref Range Status   Specimen Description BLOOD LEFT WRIST  Final   Special Requests BOTTLES DRAWN AEROBIC AND ANAEROBIC 5ML  Final   Culture   Final    NO GROWTH 5 DAYS Performed at [facility], [place] [address] [place], [place] [address]    Report Status 04/16/2016 FINAL  Final  Blood Culture (routine x 2)     Status: None   Collection Time: 04/11/16 12:35 AM  Result Value Ref Range Status   Specimen Description BLOOD LEFT HAND  Final   Special Requests BOTTLES DRAWN AEROBIC AND ANAEROBIC 5ML  Final   Culture   Final    NO GROWTH 5 DAYS Performed at [facility], [place] [address] [place], [place] [address]    Report Status 04/16/2016 FINAL  Final  Urine culture     Status: None   Collection Time: 04/11/16  2:27 AM  Result Value Ref Range Status   Specimen Description URINE, RANDOM  Final   Special Requests NONE  Final   Culture   Final    NO GROWTH Performed at [facility], [place] [address], [place],  [address]    Report Status 04/12/2016 FINAL  Final  Aerobic Culture (superficial specimen)  Status: None   Collection Time: 04/11/16  9:20 AM  Result Value Ref Range Status   Specimen Description ABSCESS RECTAL SWAB  Final   Special Requests NONE  Final   Gram Stain   Final    NO WBC SEEN ABUNDANT GRAM NEGATIVE RODS ABUNDANT GRAM POSITIVE COCCI IN PAIRS Performed at [facility], [place] [address] [place], [place] [address]    Culture   Final    ABUNDANT STREPTOCOCCUS GROUP F RARE METHICILLIN RESISTANT STAPHYLOCOCCUS AUREUS FEW EIKENELLA CORRODENS    Report Status 04/15/2016 FINAL  Final   Organism ID, Bacteria METHICILLIN RESISTANT STAPHYLOCOCCUS AUREUS  Final      Susceptibility   Methicillin resistant staphylococcus aureus - MIC*    CIPROFLOXACIN <=0.5 SENSITIVE Sensitive     ERYTHROMYCIN >=8 RESISTANT Resistant     GENTAMICIN <=0.5 SENSITIVE Sensitive     OXACILLIN >=4 RESISTANT  Resistant     TETRACYCLINE <=1 SENSITIVE Sensitive     VANCOMYCIN <=0.5 SENSITIVE Sensitive     TRIMETH/SULFA <=10 SENSITIVE Sensitive     CLINDAMYCIN <=0.25 SENSITIVE Sensitive     RIFAMPIN <=0.5 SENSITIVE Sensitive     Inducible Clindamycin NEGATIVE Sensitive     * RARE METHICILLIN RESISTANT STAPHYLOCOCCUS AUREUS  MRSA PCR Screening     Status: None   Collection Time: 04/11/16  5:10 PM  Result Value Ref Range Status   MRSA by PCR NEGATIVE NEGATIVE Final    Comment:        The GeneXpert MRSA Assay (FDA approved for NASAL specimens only), is one component of a comprehensive MRSA colonization surveillance program. It is not intended to diagnose MRSA infection nor to guide or monitor treatment for MRSA infections.      Time coordinating discharge: Over 30 minutes  SIGNED:   Elmarie Shiley, MD  [facility] 04/17/2016, 1:09 PM Pager [phone]  If 7PM-7AM, please contact night-coverage [email or web address] Password [email or web address]

## 2016-04-19 ENCOUNTER — Encounter (HOSPITAL_COMMUNITY): Payer: Self-pay | Admitting: Surgery

## 2016-04-24 ENCOUNTER — Ambulatory Visit: Payer: Self-pay | Attending: Internal Medicine | Admitting: Pharmacist

## 2016-04-24 DIAGNOSIS — E1165 Type 2 diabetes mellitus with hyperglycemia: Secondary | ICD-10-CM | POA: Insufficient documentation

## 2016-04-24 DIAGNOSIS — Z794 Long term (current) use of insulin: Secondary | ICD-10-CM | POA: Insufficient documentation

## 2016-04-24 DIAGNOSIS — E118 Type 2 diabetes mellitus with unspecified complications: Secondary | ICD-10-CM

## 2016-04-24 DIAGNOSIS — IMO0002 Reserved for concepts with insufficient information to code with codable children: Secondary | ICD-10-CM

## 2016-04-24 MED ORDER — INSULIN PEN NEEDLE 31G X 6 MM MISC: 5 refills | Status: DC

## 2016-04-24 MED ORDER — METFORMIN HCL ER 500 MG PO TB24: 500.0000 mg | ORAL_TABLET | Freq: Two times a day (BID) | ORAL | 1 refills | Status: DC

## 2016-04-24 MED ORDER — INSULIN GLARGINE 100 UNIT/ML SOLOSTAR PEN: 40.0000 [IU] | PEN_INJECTOR | Freq: Every day | SUBCUTANEOUS | 3 refills | Status: DC

## 2016-04-24 MED ORDER — INSULIN ASPART 100 UNIT/ML FLEXPEN: PEN_INJECTOR | SUBCUTANEOUS | 0 refills | Status: DC

## 2016-04-24 MED FILL — AMOX-CLAV 875-125 MG TABLET: 875-125 | 10 days supply | Qty: 20 | Fill #0

## 2016-04-24 MED FILL — !NOVOLOG MIX 70/30 VIAL: 70-30/ML | 24 days supply | Qty: 20 | Fill #0

## 2016-04-24 MED FILL — DOXYCYCLINE 100 MG TABLET: 100 | 10 days supply | Qty: 20 | Fill #0

## 2016-04-24 MED FILL — ?AMLODIPINE BESYLATE 5 MG T: 5 | 30 days supply | Qty: 30 | Fill #0

## 2016-04-24 NOTE — Patient Instructions (Addendum)
Thanks for coming to see [place]!  Start Lantus 40 units daily  Start Novolog 20 units 15 minutes prior to your two largest meals of the day. Do not take it if you do not eat.  Restart metformin 1 tablet twice a day.  Come back in 1-2 weeks.  Follow up with Dr. Janne Napoleon for letter and knee pain   Hypoglycemia  Hypoglycemia is when the sugar (glucose) level in the blood is too low. Symptoms of low blood sugar may include:  Feeling:  Hungry.  Worried or nervous (anxious).  Sweaty and clammy.  Confused.  Dizzy.  Sleepy.  Sick to your stomach (nauseous).  Having:  A fast heartbeat.  A headache.  A change in your vision.  Jerky movements that you cannot control (seizure).  Nightmares.  Tingling or no feeling (numbness) around the mouth, lips, or tongue.  Having trouble with:  Talking.  Paying attention (concentrating).  Moving (coordination).  Sleeping.  Shaking.  Passing out (fainting).  Getting upset easily (irritability). Low blood sugar can happen to people who have diabetes and people who do not have diabetes. Low blood sugar can happen quickly, and it can be an emergency. Treating Low Blood Sugar  Low blood sugar is often treated by eating or drinking something sugary right away. If you can think clearly and swallow safely, follow the 15:15 rule:  Take 15 grams of a fast-acting carb (carbohydrate). Some fast-acting carbs are:  1 tube of glucose gel.  3 sugar tablets (glucose pills).  6-8 pieces of hard candy.  4 oz (120 mL) of fruit juice.  4 oz (120 mL) of regular (not diet) soda.  Check your blood sugar 15 minutes after you take the carb.  If your blood sugar is still at or below 70 mg/dL (3.9 mmol/L), take 15 grams of a carb again.  If your blood sugar does not go above 70 mg/dL (3.9 mmol/L) after 3 tries, get help right away.  After your blood sugar goes back to normal, eat a meal or a snack within 1 hour. Treating Very Low Blood  Sugar  If your blood sugar is at or below 54 mg/dL (3 mmol/L), you have very low blood sugar (severe hypoglycemia). This is an emergency. Do not wait to see if the symptoms will go away. Get medical help right away. Call your local emergency services (911 in the [place] Do not drive yourself to the hospital. If you have very low blood sugar and you cannot eat or drink, you may need a glucagon shot (injection). A family member or friend should learn how to check your blood sugar and how to give you a glucagon shot. Ask your doctor if you need to have a glucagon shot kit at home. Follow these instructions at home: General instructions  Avoid any diets that cause you to not eat enough food. Talk with your doctor before you start any new diet.  Take over-the-counter and prescription medicines only as told by your doctor.  Limit alcohol to no more than 1 drink per day for nonpregnant women and 2 drinks per day for men. One drink equals 12 oz of beer, 5 oz of wine, or 1 oz of hard liquor.  Keep all follow-up visits as told by your doctor. This is important. If You Have Diabetes:   Make sure you know the symptoms of low blood sugar.  Always keep a source of sugar with you, such as:  Sugar.  Sugar tablets.  Glucose gel.  Fruit juice.  Regular soda (not diet soda).  Milk.  Hard candy.  Honey.  Take your medicines as told.  Follow your exercise and meal plan.  Eat on time. Do not skip meals.  Follow your sick day plan when you cannot eat or drink normally. Make this plan ahead of time with your doctor.  Check your blood sugar as often as told by your doctor. Always check before and after exercise.  Share your diabetes care plan with:  Your work or school.  People you live with.  Check your pee (urine) for ketones:  When you are sick.  As told by your doctor.  Carry a card or wear jewelry that says you have diabetes. If You Have Low Blood Sugar From Other Causes:    Check your blood sugar as often as told by your doctor.  Follow instructions from your doctor about what you cannot eat or drink. Contact a doctor if:  You have trouble keeping your blood sugar in your target range.  You have low blood sugar often. Get help right away if:  You still have symptoms after you eat or drink something sugary.  Your blood sugar is at or below 54 mg/dL (3 mmol/L).  You have jerky movements that you cannot control.  You pass out. These symptoms may be an emergency. Do not wait to see if the symptoms will go away. Get medical help right away. Call your local emergency services (911 in the [place] Do not drive yourself to the hospital.  This information is not intended to replace advice given to you by your health care provider. Make sure you discuss any questions you have with your health care provider. Document Released: 05/30/2009 Document Revised: 08/11/2015 Document Reviewed: 04/08/2015 Elsevier Interactive Patient Education  2017 [employer].

## 2016-04-24 NOTE — Progress Notes (Addendum)
    S:    Patient arrives in good spirits with his wife.  Presents for diabetes evaluation, education, and management at the request of Dr. Janne Napoleon. Patient was referred on 04/10/16.  Patient was last seen by Primary Care Provider on 04/10/16.   He reports that his wounds are getting better and that he is feeling better overall. He is very concerned about access to food and would like to get food stamps.  Patient denies adherence with medications due to cost.  Current diabetes medications include: Novolog Mix 70/30 42 units twice daily but really is only taking it once a day.  Patient denies hypoglycemic events.  Patient reported dietary habits: does not watch what he eats and will sometimes skip meals.  Patient reported exercise habits: none   Patient reports nocturia.  Patient reports neuropathy. Patient reports visual changes. Patient reports self foot exams.   Patient was recently discharged from hospital and all medications have been reviewed.  O:  Lab Results  Component Value Date   HGBA1C 10.5 (H) 04/11/2016   There were no vitals filed for this visit.  Home fasting CBG: 200s-300s 2 hour post-prandial/random CBG: up to 500s   A/P: Diabetes longstanding currently UNcontrolled based on A1c of 10.5 and home readings. Patient denies hypoglycemic events and is able to verbalize appropriate hypoglycemia management plan. Patient denies adherence with medication. Control is suboptimal due to lack of understanding of disease state and sedentary lifestyle and dietary indiscretion.  Due to missing meals, 70/30 is not appropriate for this patient due to risk of hypoglycemia as we continue to increase the dose and he definitely needs more insulin on board. He has the orange card and pharmacy card so will discontinue 70/30 insulin and start Lantus 40 units daily and Novolog 20 units prior to the 2 largest meals of the day - patient was unwilling to do more than 3 shots a day. Stressed  to patient to skip Novolog if he does not eat. Restarting metformin XL 500 mg BID. Patient counseled on potential adverse effects such as hypoglycemia, GI upset, and injection site reactions.  Patient requested note for food stamps and evaluation of knee pain - patient instructed to follow up with Dr. Janne Napoleon.   Next A1C anticipated April 2018.    Written patient instructions provided.  Total time in face to face counseling 30 minutes.   Follow up in Pharmacist Clinic Visit in 1-2 weeks.   Patient seen with Windell Moulding, PharmD Candidate

## 2016-04-25 ENCOUNTER — Telehealth: Payer: Self-pay | Admitting: Internal Medicine

## 2016-04-25 ENCOUNTER — Ambulatory Visit (HOSPITAL_COMMUNITY)
Admission: RE | Admit: 2016-04-25 | Discharge: 2016-04-25 | Disposition: A | Discharge status: Home / Self Care | Payer: Self-pay | Source: Ambulatory Visit | Attending: Internal Medicine | Admitting: Internal Medicine

## 2016-04-25 ENCOUNTER — Encounter: Payer: Self-pay | Admitting: Internal Medicine

## 2016-04-25 ENCOUNTER — Ambulatory Visit: Payer: Self-pay | Attending: Internal Medicine | Admitting: Internal Medicine

## 2016-04-25 VITALS — BP 133/90 | HR 99 | Temp 98.0°F | Wt 215.6 lb

## 2016-04-25 DIAGNOSIS — K651 Peritoneal abscess: Secondary | ICD-10-CM

## 2016-04-25 DIAGNOSIS — A4902 Methicillin resistant Staphylococcus aureus infection, unspecified site: Secondary | ICD-10-CM | POA: Insufficient documentation

## 2016-04-25 DIAGNOSIS — E118 Type 2 diabetes mellitus with unspecified complications: Secondary | ICD-10-CM

## 2016-04-25 DIAGNOSIS — Z22322 Carrier or suspected carrier of Methicillin resistant Staphylococcus aureus: Secondary | ICD-10-CM

## 2016-04-25 DIAGNOSIS — M25551 Pain in right hip: Secondary | ICD-10-CM

## 2016-04-25 DIAGNOSIS — M25552 Pain in left hip: Secondary | ICD-10-CM

## 2016-04-25 DIAGNOSIS — G8929 Other chronic pain: Secondary | ICD-10-CM

## 2016-04-25 DIAGNOSIS — IMO0002 Reserved for concepts with insufficient information to code with codable children: Secondary | ICD-10-CM

## 2016-04-25 DIAGNOSIS — Z794 Long term (current) use of insulin: Secondary | ICD-10-CM

## 2016-04-25 DIAGNOSIS — I10 Essential (primary) hypertension: Secondary | ICD-10-CM | POA: Insufficient documentation

## 2016-04-25 DIAGNOSIS — E1165 Type 2 diabetes mellitus with hyperglycemia: Secondary | ICD-10-CM

## 2016-04-25 DIAGNOSIS — M25561 Pain in right knee: Secondary | ICD-10-CM | POA: Insufficient documentation

## 2016-04-25 DIAGNOSIS — T148XXA Other injury of unspecified body region, initial encounter: Secondary | ICD-10-CM

## 2016-04-25 DIAGNOSIS — M25562 Pain in left knee: Secondary | ICD-10-CM

## 2016-04-25 DIAGNOSIS — L089 Local infection of the skin and subcutaneous tissue, unspecified: Secondary | ICD-10-CM

## 2016-04-25 DIAGNOSIS — A1801 Tuberculosis of spine: Secondary | ICD-10-CM | POA: Insufficient documentation

## 2016-04-25 DIAGNOSIS — J45909 Unspecified asthma, uncomplicated: Secondary | ICD-10-CM | POA: Insufficient documentation

## 2016-04-25 LAB — POCT URINALYSIS DIPSTICK
Bilirubin, UA: NEGATIVE
Blood, UA: NEGATIVE
GLUCOSE UA: 500
Ketones, UA: NEGATIVE
LEUKOCYTES UA: NEGATIVE
NITRITE UA: NEGATIVE
PROTEIN UA: NEGATIVE
SPEC GRAV UA: 1.015
UROBILINOGEN UA: 0.2
pH, UA: 5.5

## 2016-04-25 LAB — GLUCOSE, POCT (MANUAL RESULT ENTRY)
POC Glucose: 391 mg/dl — AB (ref 70–99)
POC Glucose: 268 mg/dl — AB (ref 70–99)

## 2016-04-25 MED ORDER — INSULIN GLARGINE 100 UNIT/ML SOLOSTAR PEN: 40.0000 [IU] | PEN_INJECTOR | Freq: Every day | SUBCUTANEOUS | 3 refills | Status: DC

## 2016-04-25 MED ORDER — INSULIN ASPART 100 UNIT/ML ~~LOC~~ SOLN: 20.0000 [IU] | Freq: Once | SUBCUTANEOUS | Status: DC

## 2016-04-25 MED ORDER — INSULIN ASPART 100 UNIT/ML FLEXPEN: PEN_INJECTOR | SUBCUTANEOUS | 3 refills | Status: DC

## 2016-04-25 NOTE — Progress Notes (Signed)
[name] [name], is a 31 y.o. male  WCB:[id number]  OHY:[id number]  DOB - 04-13-1985  No chief complaint on file.       Subjective:   [name] [name] is a 31 y.o. male here today for a follow up visit of recent hospitalization 1/23-1/30/18 for sacral wound/abscess.   Pt stated when he went home after I saw him on 1/23, a lump formed and it started hurting more, so he went to ER.  He is sp excision on ID on 1/24 and OR ID on 1/26 w/ woundvac placement. woundvac was dc'd on discharge.  +MRSA culture.  Has home hhc now for wound care 2x week, and he has appt w/ GS f/u on 2/22 he thinks.  He just picked up his antibiotics and insulin last night, and started taking the antibiotics last night. Has not started taking the insulin yet because he "forgot."    He states his biggest issue today is his constant knee pains, left > right, right knee more stiff. Also c/o of bilat hip pains as well  He says last time wound redressed was last night. He did not take any insulin this am prior to arrival.  Patient has No headache, No chest pain, No abdominal pain - No Nausea, No new weakness tingling or numbness, No Cough - SOB.  No problems updated.  ALLERGIES: No Known Allergies  PAST MEDICAL HISTORY: Past Medical History:  Diagnosis Date  . Asthma   . Diabetes mellitus without complication ([place])   . Hypertension     MEDICATIONS AT HOME: Prior to Admission medications   Medication Sig Start Date End Date Taking? Authorizing Provider  acetaminophen (TYLENOL) 325 MG tablet Take 2 tablets (650 mg total) by mouth every 6 (six) hours as needed for mild pain or fever (for pain). 04/17/16  Yes Belkys A Regalado, MD  amLODipine (NORVASC) 5 MG tablet Take 1 tablet (5 mg total) by mouth daily. 04/17/16  Yes Belkys A Regalado, MD  amoxicillin-clavulanate (AUGMENTIN) 875-125 MG tablet Take 1 tablet by mouth every 12 (twelve) hours. 04/17/16  Yes Belkys A Regalado, MD  Blood Glucose Monitoring Suppl (TRUE METRIX GO  GLUCOSE METER) w/Device KIT 1 each by Does not apply route every 8 (eight) hours as needed. 04/10/16  Yes Maren Reamer, MD  doxycycline (VIBRA-TABS) 100 MG tablet Take 1 tablet (100 mg total) by mouth every 12 (twelve) hours. 04/17/16  Yes Belkys A Regalado, MD  ferrous sulfate 325 (65 FE) MG tablet Take 1 tablet (325 mg total) by mouth daily with breakfast. 04/17/16  Yes Belkys A Regalado, MD  glucose blood test strip Use as instructed 04/10/16  Yes Maren Reamer, MD  insulin aspart (NOVOLOG FLEXPEN) 100 UNIT/ML FlexPen Inject 20 units 15 minutes before your two largest meals of the day 04/25/16  Yes Maren Reamer, MD  Insulin Glargine (LANTUS SOLOSTAR) 100 UNIT/ML Solostar Pen Inject 40 Units into the skin daily at 10 pm. 04/25/16  Yes Maren Reamer, MD  Insulin Pen Needle (ULTICARE MINI PEN NEEDLES) 31G X 6 MM MISC Use as directed 04/24/16  Yes Maren Reamer, MD  Insulin Syringe 27G X 1/2" 1 ML MISC 1 application by Does not apply route 2 (two) times daily. 04/10/16  Yes Maren Reamer, MD  metFORMIN (GLUCOPHAGE XR) 500 MG 24 hr tablet Take 1 tablet (500 mg total) by mouth 2 (two) times daily. 04/24/16  Yes Maren Reamer, MD  TRUEPLUS LANCETS 26G MISC 1 each by  Does not apply route every 8 (eight) hours as needed. 04/10/16  Yes Maren Reamer, MD  HYDROcodone-acetaminophen (NORCO/VICODIN) 5-325 MG tablet Take 1 tablet by mouth every 6 (six) hours as needed. Patient not taking: Reported on 04/10/2016 02/16/16   Shawn C Joy, PA-C     Objective:   Vitals:   04/25/16 0929  BP: 133/90  Pulse: 99  Temp: 98 F (36.7 C)  TempSrc: Oral  SpO2: 100%  Weight: 215 lb 9.6 oz (97.8 kg)    Exam General appearance : Awake, alert, not in any distress. Speech Clear. Not toxic looking, pleasant. HEENT: Atraumatic and Normocephalic, pupils equally reactive to light. Neck: supple, no JVD.  Chest:Good air entry bilaterally, no added sounds. CVS: S1 S2 regular, no murmurs/gallups or  rubs. Abdomen: Bowel sounds active, Non tender and not distended with no gaurding, rigidity or rebound. Back/sacral wound appears much improved. Less erythema, no serous drainage noted today, but dressing/packing was  very saturated  w/ serous drainage.  Mild induration on the right side > left side of wound, but no fluctuance.  Extremities: B/L Lower Ext shows no edema, both legs are warm to touch Neurology: Awake alert, and oriented X 3, CN II-XII grossly intact, Non focal Skin:No Rash  Data Review Lab Results  Component Value Date   HGBA1C 10.5 (H) 04/11/2016   HGBA1C 13.7 (H) 02/23/2016   HGBA1C 13.7 (H) 08/31/2015    Depression screen PHQ 2/9 04/25/2016 04/10/2016 03/07/2016  Decreased Interest '1 2 1  ' Down, Depressed, Hopeless '1 2 1  ' PHQ - 2 Score '2 4 2  ' Altered sleeping '3 3 2  ' Tired, decreased energy '2 3 3  ' Change in appetite '1 2 1  ' Feeling bad or failure about yourself  '3 2 1  ' Trouble concentrating '1 1 1  ' Moving slowly or fidgety/restless 1 1 0  Suicidal thoughts 1 1 0  PHQ-9 Score '14 17 10      ' Assessment & Plan   1. Uncontrolled type 2 diabetes mellitus with complication, with long-term current use of insulin ([place]) Lengthy discussion w/ pt that needs his dm tighter controlled for better wound control, needs to be checking his cbg and taking his insulin! - POCT glucose (manual entry) 391 --> 276 - insulin aspart (novoLOG) injection 20 Units; Inject 0.2 mLs (20 Units total) into the skin once. - POCT glucose (manual entry) - POCT urinalysis dipstick - no ketones - renewed lantus 40 units qpm and novolog 20untis prior to meals. - continue metformin 500 bid. [facility] 2 wks for dm chk, encouraged to check his cbg at least daily and bring in his glucometer.  2. Wound infection Much improved after surg ID, continue home wound care, wound redressed today. Encouraged to finish his antibiotics course (just picked up last night, when 7 days w/o abx.   Probiotics, such as Activia yogurt bid to prevent diarrhea on abx. Continue home wound care/hh Encouraged to f/u w/ GS on f/u  3. Chronic pain of both knees - DG Knee Complete 4 Views Left; Future - DG Knee Complete 4 Views Right; Future - Ambulatory referral to Physical Medicine Rehab - Ambulatory referral to Physical Therapy  4. Bilateral hip pain - DG HIPS BILAT W OR W/O PELVIS 3-4 VIEWS; Future  5. MRSA (methicillin resistant staph aureus) culture positive See #2  6. Pelvic abscess in male [facility]) See #2     Patient have been counseled extensively about nutrition and exercise  Return in about  2 weeks (around 05/09/2016) for wound check.  The patient was given clear instructions to go to ER or return to medical center if symptoms don't improve, worsen or new problems develop. The patient verbalized understanding. The patient was told to call to get lab results if they haven't heard anything in the next week.   This note has been created with Surveyor, quantity. Any transcriptional errors are unintentional.   Maren Reamer, MD, [facility] [place], [place]   04/25/2016, 1:19 PM

## 2016-04-25 NOTE — Patient Instructions (Addendum)
[name] pharm [facility] 2 wks / dm check.  - bring in your sugar numbers /blood sugar machine to appointment.        Follow-up Information    Pedro Earls, MD. Call.   Specialty:  General Surgery Why:  We are working on your appointment. Please call to confirm. Contact information: [address] [place] [address] [phone]     Probiotic, such as Activia yogurt 2x day while on antibiotics to prevent diarrhea.  - Diabetes Mellitus and Food It is important for you to manage your blood sugar (glucose) level. Your blood glucose level can be greatly affected by what you eat. Eating healthier foods in the appropriate amounts throughout the day at about the same time each day will help you control your blood glucose level. It can also help slow or prevent worsening of your diabetes mellitus. Healthy eating may even help you improve the level of your blood pressure and reach or maintain a healthy weight. General recommendations for healthful eating and cooking habits include:  Eating meals and snacks regularly. Avoid going long periods of time without eating to lose weight.  Eating a diet that consists mainly of plant-based foods, such as fruits, vegetables, nuts, legumes, and whole grains.  Using low-heat cooking methods, such as baking, instead of high-heat cooking methods, such as deep frying. Work with your dietitian to make sure you understand how to use the Nutrition Facts information on food labels. How can food affect me? Carbohydrates  Carbohydrates affect your blood glucose level more than any other type of food. Your dietitian will help you determine how many carbohydrates to eat at each meal and teach you how to count carbohydrates. Counting carbohydrates is important to keep your blood glucose at a healthy level, especially if you are using insulin or taking certain medicines for diabetes mellitus. Alcohol  Alcohol can cause sudden decreases in blood glucose  (hypoglycemia), especially if you use insulin or take certain medicines for diabetes mellitus. Hypoglycemia can be a life-threatening condition. Symptoms of hypoglycemia (sleepiness, dizziness, and disorientation) are similar to symptoms of having too much alcohol. If your health care provider has given you approval to drink alcohol, do so in moderation and use the following guidelines:  Women should not have more than one drink per day, and men should not have more than two drinks per day. One drink is equal to:  12 oz of beer.  5 oz of wine.  1 oz of hard liquor.  Do not drink on an empty stomach.  Keep yourself hydrated. Have water, diet soda, or unsweetened iced tea.  Regular soda, juice, and other mixers might contain a lot of carbohydrates and should be counted. What foods are not recommended? As you make food choices, it is important to remember that all foods are not the same. Some foods have fewer nutrients per serving than other foods, even though they might have the same number of calories or carbohydrates. It is difficult to get your body what it needs when you eat foods with fewer nutrients. Examples of foods that you should avoid that are high in calories and carbohydrates but low in nutrients include:  Trans fats (most processed foods list trans fats on the Nutrition Facts label).  Regular soda.  Juice.  Candy.  Sweets, such as cake, pie, doughnuts, and cookies.  Fried foods. What foods can I eat? Eat nutrient-rich foods, which will nourish your body and keep you healthy. The food you should  eat also will depend on several factors, including:  The calories you need.  The medicines you take.  Your weight.  Your blood glucose level.  Your blood pressure level.  Your cholesterol level. You should eat a variety of foods, including:  Protein.  Lean cuts of meat.  Proteins low in saturated fats, such as fish, egg whites, and beans. Avoid processed  meats.  Fruits and vegetables.  Fruits and vegetables that may help control blood glucose levels, such as apples, mangoes, and yams.  Dairy products.  Choose fat-free or low-fat dairy products, such as milk, yogurt, and cheese.  Grains, bread, pasta, and rice.  Choose whole grain products, such as multigrain bread, whole oats, and brown rice. These foods may help control blood pressure.  Fats.  Foods containing healthful fats, such as nuts, avocado, olive oil, canola oil, and fish. Does everyone with diabetes mellitus have the same meal plan? Because every person with diabetes mellitus is different, there is not one meal plan that works for everyone. It is very important that you meet with a dietitian who will help you create a meal plan that is just right for you. This information is not intended to replace advice given to you by your health care provider. Make sure you discuss any questions you have with your health care provider. Document Released: 11/30/2004 Document Revised: 08/11/2015 Document Reviewed: 01/30/2013 Elsevier Interactive Patient Education  2017 [employer].

## 2016-04-25 NOTE — Telephone Encounter (Signed)
Called pt, confirmed dob.   bilat knee xrays and hip films nml F/u w/ pmr and PT for further eval, recd RICE, recd stretching exercises for now.

## 2016-04-27 ENCOUNTER — Telehealth: Payer: Self-pay | Admitting: Internal Medicine

## 2016-04-27 NOTE — Telephone Encounter (Signed)
Barbara from [employer] called to with PCP in inform her that patient is not being compliant with the instructions given to him. Patient is not taking medications or caring for his wound.   Thank you.

## 2016-04-27 NOTE — Telephone Encounter (Signed)
Will forward to pcp

## 2016-04-30 NOTE — Telephone Encounter (Signed)
Contacted Gen Surgery pt has an appointment schedule on the 22 of February

## 2016-04-30 NOTE — Telephone Encounter (Signed)
Please  1. check with Gen surg office below to see if he has made f/u appt yet.   If he did, get them to send copy of office appt visit to [place]. Pedro Earls, MD. Call.   Specialty:  General Surgery Why:  We are working on your appointment. Please call to confirm. Contact information: [place] [address] [phone]  2. If not made gen surg f/u, call pt and tell him he needs to asap.    3. Get him to make appt w/ [place] as well in 1 wk for wound check, anyone available ok if i'm not here.   thanks

## 2016-05-09 ENCOUNTER — Ambulatory Visit: Payer: Self-pay | Admitting: Pharmacist

## 2016-06-21 ENCOUNTER — Ambulatory Visit: Payer: Self-pay | Attending: Internal Medicine | Admitting: Physical Therapy

## 2016-06-25 ENCOUNTER — Ambulatory Visit: Payer: Self-pay | Admitting: Internal Medicine

## 2016-07-17 ENCOUNTER — Ambulatory Visit: Payer: Self-pay | Admitting: Internal Medicine

## 2016-08-02 ENCOUNTER — Encounter: Payer: Self-pay | Admitting: Internal Medicine

## 2016-08-03 ENCOUNTER — Encounter: Payer: Self-pay | Admitting: Internal Medicine

## 2016-08-06 ENCOUNTER — Encounter: Payer: Self-pay | Admitting: Internal Medicine

## 2016-08-08 ENCOUNTER — Encounter: Payer: Self-pay | Admitting: Internal Medicine

## 2016-08-08 ENCOUNTER — Ambulatory Visit: Payer: Self-pay | Attending: Internal Medicine | Admitting: Internal Medicine

## 2016-08-08 VITALS — BP 138/92 | HR 85 | Temp 98.4°F | Resp 16 | Wt 207.6 lb

## 2016-08-08 DIAGNOSIS — Z6832 Body mass index (BMI) 32.0-32.9, adult: Secondary | ICD-10-CM | POA: Insufficient documentation

## 2016-08-08 DIAGNOSIS — IMO0002 Reserved for concepts with insufficient information to code with codable children: Secondary | ICD-10-CM

## 2016-08-08 DIAGNOSIS — I10 Essential (primary) hypertension: Secondary | ICD-10-CM | POA: Insufficient documentation

## 2016-08-08 DIAGNOSIS — E669 Obesity, unspecified: Secondary | ICD-10-CM | POA: Insufficient documentation

## 2016-08-08 DIAGNOSIS — K029 Dental caries, unspecified: Secondary | ICD-10-CM | POA: Insufficient documentation

## 2016-08-08 DIAGNOSIS — M2560 Stiffness of unspecified joint, not elsewhere classified: Secondary | ICD-10-CM | POA: Insufficient documentation

## 2016-08-08 DIAGNOSIS — F329 Major depressive disorder, single episode, unspecified: Secondary | ICD-10-CM | POA: Insufficient documentation

## 2016-08-08 DIAGNOSIS — F32A Depression, unspecified: Secondary | ICD-10-CM

## 2016-08-08 DIAGNOSIS — M25662 Stiffness of left knee, not elsewhere classified: Secondary | ICD-10-CM

## 2016-08-08 DIAGNOSIS — E118 Type 2 diabetes mellitus with unspecified complications: Secondary | ICD-10-CM

## 2016-08-08 DIAGNOSIS — E1165 Type 2 diabetes mellitus with hyperglycemia: Secondary | ICD-10-CM | POA: Insufficient documentation

## 2016-08-08 DIAGNOSIS — G4700 Insomnia, unspecified: Secondary | ICD-10-CM | POA: Insufficient documentation

## 2016-08-08 DIAGNOSIS — Z794 Long term (current) use of insulin: Secondary | ICD-10-CM | POA: Insufficient documentation

## 2016-08-08 LAB — POCT GLYCOSYLATED HEMOGLOBIN (HGB A1C): Hemoglobin A1C: 11.8

## 2016-08-08 LAB — GLUCOSE, POCT (MANUAL RESULT ENTRY)
POC Glucose: 324 mg/dl — AB (ref 70–99)
POC Glucose: 326 mg/dl — AB (ref 70–99)

## 2016-08-08 MED ORDER — GABAPENTIN 100 MG PO CAPS: 100.0000 mg | ORAL_CAPSULE | Freq: Every day | ORAL | 1 refills | Status: DC

## 2016-08-08 MED ORDER — PRAVASTATIN SODIUM 20 MG PO TABS: 20.0000 mg | ORAL_TABLET | Freq: Every day | ORAL | 3 refills | Status: DC

## 2016-08-08 MED ORDER — ESCITALOPRAM OXALATE 20 MG PO TABS: 20.0000 mg | ORAL_TABLET | Freq: Every day | ORAL | 1 refills | Status: DC

## 2016-08-08 MED ORDER — AMLODIPINE BESYLATE 5 MG PO TABS: 5.0000 mg | ORAL_TABLET | Freq: Every day | ORAL | 2 refills | Status: DC

## 2016-08-08 MED ORDER — INSULIN ASPART 100 UNIT/ML ~~LOC~~ SOLN
10.0000 [IU] | Freq: Once | SUBCUTANEOUS | Status: AC
  Administered 2016-08-08: 10 [IU] via SUBCUTANEOUS

## 2016-08-08 MED ORDER — METFORMIN HCL ER 500 MG PO TB24: 500.0000 mg | ORAL_TABLET | Freq: Two times a day (BID) | ORAL | 2 refills | Status: DC

## 2016-08-08 MED ORDER — INSULIN DETEMIR 100 UNIT/ML FLEXPEN: 20.0000 [IU] | PEN_INJECTOR | Freq: Two times a day (BID) | SUBCUTANEOUS | 3 refills | Status: DC

## 2016-08-08 MED ORDER — CYCLOBENZAPRINE HCL 10 MG PO TABS: 10.0000 mg | ORAL_TABLET | Freq: Three times a day (TID) | ORAL | 0 refills | Status: DC | PRN

## 2016-08-08 MED ORDER — INSULIN ASPART 100 UNIT/ML FLEXPEN: PEN_INJECTOR | SUBCUTANEOUS | 3 refills | Status: DC

## 2016-08-08 MED FILL — ESCITALOPRAM 20 MG TABLET: 20 | 30 days supply | Qty: 30 | Fill #0

## 2016-08-08 MED FILL — CYCLOBENZAPRINE 10 MG TAB: 10 | 10 days supply | Qty: 30 | Fill #0

## 2016-08-08 MED FILL — AMLODIPINE BESYLATE 5 MG TA: 5 | 30 days supply | Qty: 30 | Fill #0

## 2016-08-08 MED FILL — GABAPENTIN 100 MG CAPSULE: 100 | 30 days supply | Qty: 30 | Fill #0

## 2016-08-08 MED FILL — METFORMIN HCL ER 500 MG TAB: 500 | 30 days supply | Qty: 60 | Fill #0

## 2016-08-08 MED FILL — PRAVASTATIN NA 20 MG TAB: 20 | 30 days supply | Qty: 30 | Fill #0

## 2016-08-08 NOTE — Patient Instructions (Signed)
[facility] - dm check  - Diabetes Mellitus and Food It is important for you to manage your blood sugar (glucose) level. Your blood glucose level can be greatly affected by what you eat. Eating healthier foods in the appropriate amounts throughout the day at about the same time each day will help you control your blood glucose level. It can also help slow or prevent worsening of your diabetes mellitus. Healthy eating may even help you improve the level of your blood pressure and reach or maintain a healthy weight. General recommendations for healthful eating and cooking habits include:  Eating meals and snacks regularly. Avoid going long periods of time without eating to lose weight.  Eating a diet that consists mainly of plant-based foods, such as fruits, vegetables, nuts, legumes, and whole grains.  Using low-heat cooking methods, such as baking, instead of high-heat cooking methods, such as deep frying. Work with your dietitian to make sure you understand how to use the Nutrition Facts information on food labels. How can food affect me? Carbohydrates  Carbohydrates affect your blood glucose level more than any other type of food. Your dietitian will help you determine how many carbohydrates to eat at each meal and teach you how to count carbohydrates. Counting carbohydrates is important to keep your blood glucose at a healthy level, especially if you are using insulin or taking certain medicines for diabetes mellitus. Alcohol  Alcohol can cause sudden decreases in blood glucose (hypoglycemia), especially if you use insulin or take certain medicines for diabetes mellitus. Hypoglycemia can be a life-threatening condition. Symptoms of hypoglycemia (sleepiness, dizziness, and disorientation) are similar to symptoms of having too much alcohol. If your health care provider has given you approval to drink alcohol, do so in moderation and use the following guidelines:  Women should  not have more than one drink per day, and men should not have more than two drinks per day. One drink is equal to:  12 oz of beer.  5 oz of wine.  1 oz of hard liquor.  Do not drink on an empty stomach.  Keep yourself hydrated. Have water, diet soda, or unsweetened iced tea.  Regular soda, juice, and other mixers might contain a lot of carbohydrates and should be counted. What foods are not recommended? As you make food choices, it is important to remember that all foods are not the same. Some foods have fewer nutrients per serving than other foods, even though they might have the same number of calories or carbohydrates. It is difficult to get your body what it needs when you eat foods with fewer nutrients. Examples of foods that you should avoid that are high in calories and carbohydrates but low in nutrients include:  Trans fats (most processed foods list trans fats on the Nutrition Facts label).  Regular soda.  Juice.  Candy.  Sweets, such as cake, pie, doughnuts, and cookies.  Fried foods. What foods can I eat? Eat nutrient-rich foods, which will nourish your body and keep you healthy. The food you should eat also will depend on several factors, including:  The calories you need.  The medicines you take.  Your weight.  Your blood glucose level.  Your blood pressure level.  Your cholesterol level. You should eat a variety of foods, including:  Protein.  Lean cuts of meat.  Proteins low in saturated fats, such as fish, egg whites, and beans. Avoid processed meats.  Fruits and vegetables.  Fruits and vegetables  that may help control blood glucose levels, such as apples, mangoes, and yams.  Dairy products.  Choose fat-free or low-fat dairy products, such as milk, yogurt, and cheese.  Grains, bread, pasta, and rice.  Choose whole grain products, such as multigrain bread, whole oats, and brown rice. These foods may help control blood  pressure.  Fats.  Foods containing healthful fats, such as nuts, avocado, olive oil, canola oil, and fish. Does everyone with diabetes mellitus have the same meal plan? Because every person with diabetes mellitus is different, there is not one meal plan that works for everyone. It is very important that you meet with a dietitian who will help you create a meal plan that is just right for you. This information is not intended to replace advice given to you by your health care provider. Make sure you discuss any questions you have with your health care provider. Document Released: 11/30/2004 Document Revised: 08/11/2015 Document Reviewed: 01/30/2013 Elsevier Interactive Patient Education  2017 Elsevier Inc.  -  Low-Sodium Eating Plan Sodium, which is an element that makes up salt, helps you maintain a healthy balance of fluids in your body. Too much sodium can increase your blood pressure and cause fluid and waste to be held in your body. Your health care provider or dietitian may recommend following this plan if you have high blood pressure (hypertension), kidney disease, liver disease, or heart failure. Eating less sodium can help lower your blood pressure, reduce swelling, and protect your heart, liver, and kidneys. What are tips for following this plan? General guidelines   Most people on this plan should limit their sodium intake to 1,500-2,000 mg (milligrams) of sodium each day. Reading food labels   The Nutrition Facts label lists the amount of sodium in one serving of the food. If you eat more than one serving, you must multiply the listed amount of sodium by the number of servings.  Choose foods with less than 140 mg of sodium per serving.  Avoid foods with 300 mg of sodium or more per serving. Shopping   Look for lower-sodium products, often labeled as "low-sodium" or "no salt added."  Always check the sodium content even if foods are labeled as "unsalted" or "no salt  added".  Buy fresh foods.  Avoid canned foods and premade or frozen meals.  Avoid canned, cured, or processed meats  Buy breads that have less than 80 mg of sodium per slice. Cooking   Eat more home-cooked food and less restaurant, buffet, and fast food.  Avoid adding salt when cooking. Use salt-free seasonings or herbs instead of table salt or sea salt. Check with your health care provider or pharmacist before using salt substitutes.  Cook with plant-based oils, such as canola, sunflower, or olive oil. Meal planning   When eating at a restaurant, ask that your food be prepared with less salt or no salt, if possible.  Avoid foods that contain MSG (monosodium glutamate). MSG is sometimes added to [place] food, bouillon, and some canned foods. What foods are recommended? The items listed may not be a complete list. Talk with your dietitian about what dietary choices are best for you. Grains  Low-sodium cereals, including oats, puffed wheat and rice, and shredded wheat. Low-sodium crackers. Unsalted rice. Unsalted pasta. Low-sodium bread. Whole-grain breads and whole-grain pasta. Vegetables  Fresh or frozen vegetables. "No salt added" canned vegetables. "No salt added" tomato sauce and paste. Low-sodium or reduced-sodium tomato and vegetable juice. Fruits  Fresh, frozen, or canned  fruit. Fruit juice. Meats and other protein foods  Fresh or frozen (no salt added) meat, poultry, seafood, and fish. Low-sodium canned tuna and salmon. Unsalted nuts. Dried peas, beans, and lentils without added salt. Unsalted canned beans. Eggs. Unsalted nut butters. Dairy  Milk. Soy milk. Cheese that is naturally low in sodium, such as ricotta cheese, fresh mozzarella, or Swiss cheese Low-sodium or reduced-sodium cheese. Cream cheese. Yogurt. Fats and oils  Unsalted butter. Unsalted margarine with no trans fat. Vegetable oils such as canola or olive oils. Seasonings and other foods  Fresh and dried herbs  and spices. Salt-free seasonings. Low-sodium mustard and ketchup. Sodium-free salad dressing. Sodium-free light mayonnaise. Fresh or refrigerated horseradish. Lemon juice. Vinegar. Homemade, reduced-sodium, or low-sodium soups. Unsalted popcorn and pretzels. Low-salt or salt-free chips. What foods are not recommended? The items listed may not be a complete list. Talk with your dietitian about what dietary choices are best for you. Grains  Instant hot cereals. Bread stuffing, pancake, and biscuit mixes. Croutons. Seasoned rice or pasta mixes. Noodle soup cups. Boxed or frozen macaroni and cheese. Regular salted crackers. Self-rising flour. Vegetables  Sauerkraut, pickled vegetables, and relishes. Olives. [place] fries. Onion rings. Regular canned vegetables (not low-sodium or reduced-sodium). Regular canned tomato sauce and paste (not low-sodium or reduced-sodium). Regular tomato and vegetable juice (not low-sodium or reduced-sodium). Frozen vegetables in sauces. Meats and other protein foods  Meat or fish that is salted, canned, smoked, spiced, or pickled. Bacon, ham, sausage, hotdogs, corned beef, chipped beef, packaged lunch meats, salt pork, jerky, pickled herring, anchovies, regular canned tuna, sardines, salted nuts. Dairy  Processed cheese and cheese spreads. Cheese curds. Blue cheese. Feta cheese. String cheese. Regular cottage cheese. Buttermilk. Canned milk. Fats and oils  Salted butter. Regular margarine. Ghee. Bacon fat. Seasonings and other foods  Onion salt, garlic salt, seasoned salt, table salt, and sea salt. Canned and packaged gravies. Worcestershire sauce. Tartar sauce. Barbecue sauce. Teriyaki sauce. Soy sauce, including reduced-sodium. Steak sauce. Fish sauce. Oyster sauce. Cocktail sauce. Horseradish that you find on the shelf. Regular ketchup and mustard. Meat flavorings and tenderizers. Bouillon cubes. Hot sauce and Tabasco sauce. Premade or packaged marinades. Premade or packaged  taco seasonings. Relishes. Regular salad dressings. Salsa. Potato and tortilla chips. Corn chips and puffs. Salted popcorn and pretzels. Canned or dried soups. Pizza. Frozen entrees and pot pies. Summary  Eating less sodium can help lower your blood pressure, reduce swelling, and protect your heart, liver, and kidneys.  Most people on this plan should limit their sodium intake to 1,500-2,000 mg (milligrams) of sodium each day.  Canned, boxed, and frozen foods are high in sodium. Restaurant foods, fast foods, and pizza are also very high in sodium. You also get sodium by adding salt to food.  Try to cook at home, eat more fresh fruits and vegetables, and eat less fast food, canned, processed, or prepared foods. This information is not intended to replace advice given to you by your health care provider. Make sure you discuss any questions you have with your health care provider. Document Released: 08/25/2001 Document Revised: 02/27/2016 Document Reviewed: 02/27/2016 Elsevier Interactive Patient Education  2017 Elsevier Inc.  -  Hypertension Hypertension is another name for high blood pressure. High blood pressure forces your heart to work harder to pump blood. This can cause problems over time. There are two numbers in a blood pressure reading. There is a top number (systolic) over a bottom number (diastolic). It is best to have a blood pressure  below 120/80. Healthy choices can help lower your blood pressure. You may need medicine to help lower your blood pressure if:  Your blood pressure cannot be lowered with healthy choices.  Your blood pressure is higher than 130/80. Follow these instructions at home: Eating and drinking   If directed, follow the DASH eating plan. This diet includes:  Filling half of your plate at each meal with fruits and vegetables.  Filling one quarter of your plate at each meal with whole grains. Whole grains include whole wheat pasta, brown rice, and whole  grain bread.  Eating or drinking low-fat dairy products, such as skim milk or low-fat yogurt.  Filling one quarter of your plate at each meal with low-fat (lean) proteins. Low-fat proteins include fish, skinless chicken, eggs, beans, and tofu.  Avoiding fatty meat, cured and processed meat, or chicken with skin.  Avoiding premade or processed food.  Eat less than 1,500 mg of salt (sodium) a day.  Limit alcohol use to no more than 1 drink a day for nonpregnant women and 2 drinks a day for men. One drink equals 12 oz of beer, 5 oz of wine, or 1 oz of hard liquor. Lifestyle   Work with your doctor to stay at a healthy weight or to lose weight. Ask your doctor what the best weight is for you.  Get at least 30 minutes of exercise that causes your heart to beat faster (aerobic exercise) most days of the week. This may include walking, swimming, or biking.  Get at least 30 minutes of exercise that strengthens your muscles (resistance exercise) at least 3 days a week. This may include lifting weights or pilates.  Do not use any products that contain nicotine or tobacco. This includes cigarettes and e-cigarettes. If you need help quitting, ask your doctor.  Check your blood pressure at home as told by your doctor.  Keep all follow-up visits as told by your doctor. This is important. Medicines   Take over-the-counter and prescription medicines only as told by your doctor. Follow directions carefully.  Do not skip doses of blood pressure medicine. The medicine does not work as well if you skip doses. Skipping doses also puts you at risk for problems.  Ask your doctor about side effects or reactions to medicines that you should watch for. Contact a doctor if:  You think you are having a reaction to the medicine you are taking.  You have headaches that keep coming back (recurring).  You feel dizzy.  You have swelling in your ankles.  You have trouble with your vision. Get help right  away if:  You get a very bad headache.  You start to feel confused.  You feel weak or numb.  You feel faint.  You get very bad pain in your:  Chest.  Belly (abdomen).  You throw up (vomit) more than once.  You have trouble breathing. Summary  Hypertension is another name for high blood pressure.  Making healthy choices can help lower blood pressure. If your blood pressure cannot be controlled with healthy choices, you may need to take medicine. This information is not intended to replace advice given to you by your health care provider. Make sure you discuss any questions you have with your health care provider. Document Released: 08/22/2007 Document Revised: 02/01/2016 Document Reviewed: 02/01/2016 Elsevier Interactive Patient Education  2017 [employer].

## 2016-08-08 NOTE — Progress Notes (Signed)
[name] [name], is a 31 y.o. male  [id number]  [id number]  DOB - 1985/09/06  Chief Complaint  Patient presents with  . Diabetes  . Hypertension        Subjective:   [name] [name] is a 31 y.o. male here today for a follow up visit, last seen 04/25/16, for oocdm, sacral wound, and htn. Of note, he was incarcirated x 3 wks, and just got out last week. He notes being out of all his medicines and he states he was not given any medications when he was incarcerated as well.   Denies any acute problems, except for left leg stiffness since his wound healed. Muscles feel tight, but denies pain.  Eating well, but not sleeping well.  Feels depressed, thought that sometimes "better off dead", but denies si/hi/avh. Asked for rx to help.     Patient has No headache, No chest pain, No abdominal pain - No Nausea, No new weakness tingling or numbness, No Cough - SOB.  No problems updated.  ALLERGIES: No Known Allergies  PAST MEDICAL HISTORY: Past Medical History:  Diagnosis Date  . Asthma   . Diabetes mellitus without complication ([place])   . Hypertension     MEDICATIONS AT HOME: Prior to Admission medications   Medication Sig Start Date End Date Taking? Authorizing Provider  acetaminophen (TYLENOL) 325 MG tablet Take 2 tablets (650 mg total) by mouth every 6 (six) hours as needed for mild pain or fever (for pain). 04/17/16   Regalado, Belkys A, MD  amLODipine (NORVASC) 5 MG tablet Take 1 tablet (5 mg total) by mouth daily. 08/08/16   Maren Reamer, MD  Blood Glucose Monitoring Suppl (TRUE METRIX GO GLUCOSE METER) w/Device KIT 1 each by Does not apply route every 8 (eight) hours as needed. 04/10/16   Maren Reamer, MD  cyclobenzaprine (FLEXERIL) 10 MG tablet Take 1 tablet (10 mg total) by mouth 3 (three) times daily as needed for muscle spasms. Watch for sedation 08/08/16   Lottie Mussel T, MD  escitalopram (LEXAPRO) 20 MG tablet Take 1 tablet (20 mg total) by mouth at bedtime.  08/08/16   Maren Reamer, MD  ferrous sulfate 325 (65 FE) MG tablet Take 1 tablet (325 mg total) by mouth daily with breakfast. 04/17/16   Regalado, Belkys A, MD  gabapentin (NEURONTIN) 100 MG capsule Take 1 capsule (100 mg total) by mouth at bedtime. 08/08/16   Lottie Mussel T, MD  glucose blood test strip Use as instructed 04/10/16   Lottie Mussel T, MD  insulin aspart (NOVOLOG FLEXPEN) 100 UNIT/ML FlexPen Inject 20 units 15 minutes before your two largest meals of the day 08/08/16   Lottie Mussel T, MD  Insulin Detemir (LEVEMIR FLEXPEN) 100 UNIT/ML Pen Inject 20 Units into the skin 2 (two) times daily. 08/08/16   Maren Reamer, MD  Insulin Pen Needle Flossie Buffy MINI PEN NEEDLES) 31G X 6 MM MISC Use as directed 04/24/16   Lottie Mussel T, MD  Insulin Syringe 27G X 1/2" 1 ML MISC 1 application by Does not apply route 2 (two) times daily. 04/10/16   Maren Reamer, MD  metFORMIN (GLUCOPHAGE XR) 500 MG 24 hr tablet Take 1 tablet (500 mg total) by mouth 2 (two) times daily. 08/08/16   Maren Reamer, MD  pravastatin (PRAVACHOL) 20 MG tablet Take 1 tablet (20 mg total) by mouth daily. 08/08/16   Maren Reamer, MD  TRUEPLUS LANCETS 26G MISC 1 each by Does not apply  route every 8 (eight) hours as needed. 04/10/16   Maren Reamer, MD     Objective:   Vitals:   08/08/16 0840  BP: (!) 138/92  Pulse: 85  Resp: 16  Temp: 98.4 F (36.9 C)  TempSrc: Oral  SpO2: 99%  Weight: 207 lb 9.6 oz (94.2 kg)    Exam General appearance : Awake, alert, not in any distress. Speech Clear. Not toxic looking, pleasant, obese HEENT: Atraumatic and Normocephalic, pupils equally reactive to light. Neck: supple, no JVD.  Chest:Good air entry bilaterally, no added sounds. CVS: S1 S2 regular, no murmurs/gallups or rubs. Abdomen: Bowel sounds active, Non tender and not distended with no gaurding, rigidity or rebound. Left gluteal wound is now well-heal, small scar about 1inch length. No  induration/flutulance/erythema noted.   Extremities: B/L Lower Ext shows no edema, both legs are warm to touch, neg bilat straight leg test, but pt states left leg feels very tight and some decrease rom w/ passive motion. Neurology: Awake alert, and oriented X 3, CN II-XII grossly intact, Non focal, bilat knee reflexes 2 + Skin:No Rash  Data Review Lab Results  Component Value Date   HGBA1C 11.8 08/08/2016   HGBA1C 10.5 (H) 04/11/2016   HGBA1C 13.7 (H) 02/23/2016    Depression screen PHQ 2/9 08/08/2016 04/25/2016 04/10/2016 03/07/2016  Decreased Interest 0 '1 2 1  ' Down, Depressed, Hopeless '2 1 2 1  ' PHQ - 2 Score '2 2 4 2  ' Altered sleeping '2 3 3 2  ' Tired, decreased energy '2 2 3 3  ' Change in appetite '1 1 2 1  ' Feeling bad or failure about yourself  '1 3 2 1  ' Trouble concentrating 0 '1 1 1  ' Moving slowly or fidgety/restless 0 1 1 0  Suicidal thoughts '1 1 1 ' 0  PHQ-9 Score '9 14 17 10      ' Assessment & Plan   1. Uncontrolled type 2 diabetes mellitus with complication, with long-term current use of insulin ([place]) - out of all meds, renewed same. Changed lantus to levemir 20bid., per pt has glucometer supplies. Restart metformin 500bid - Glucose (CBG) - HgB A1c  11.8 (from 10.5) - insulin aspart (novoLOG) injection 10 Units; Inject 0.1 mLs (10 Units total) into the skin once. - CBC with Differential - CMP and Liver - Microalbumin/Creatinine Ratio, Urine - Ambulatory referral to Podiatry - POCT glucose (manual entry) - low carb diet discussed, increase activity - [facility] 2 wks for dm check  2. Essential hypertension Uncontrolled, low salt diet recd Restart norvasc 5 qd for now.  3. Joint stiffness of left lower leg - prn flexeril, may be from scar tissue? From sacral/lumbar wound that is healed? Per pt, started after his wound healed. - Ambulatory referral to Physical Therapy  4. Dental cavities - Ambulatory referral to Dentistry  5. Depression, unspecified  depression type, w/ some insomnia c/os Denies si/hi/av Will ask Jasmine SW to f/u Trial lexapro 20 qhs. Fu 1 month     Patient have been counseled extensively about nutrition and exercise  Return in about 4 weeks (around 09/05/2016) for depression/anxiety/ dm/htn.  The patient was given clear instructions to go to ER or return to medical center if symptoms don't improve, worsen or new problems develop. The patient verbalized understanding. The patient was told to call to get lab results if they haven't heard anything in the next week.   This note has been created with Surveyor, quantity.  Any transcriptional errors are unintentional.   Maren Reamer, MD, [place] [facility] [place], [place]   08/08/2016, 11:04 AM

## 2016-08-09 LAB — CMP AND LIVER
ALT: 16 IU/L (ref 0–44)
AST: 13 IU/L (ref 0–40)
Albumin: 4.6 g/dL (ref 3.5–5.5)
Alkaline Phosphatase: 83 IU/L (ref 39–117)
BILIRUBIN, DIRECT: 0.09 mg/dL (ref 0.00–0.40)
BUN: 17 mg/dL (ref 6–20)
Bilirubin Total: 0.3 mg/dL (ref 0.0–1.2)
CALCIUM: 9.5 mg/dL (ref 8.7–10.2)
CO2: 27 mmol/L (ref 18–29)
CREATININE: 0.86 mg/dL (ref 0.76–1.27)
Chloride: 101 mmol/L (ref 96–106)
GFR, EST AFRICAN AMERICAN: 135 mL/min/{1.73_m2} (ref >59)
GFR, EST NON AFRICAN AMERICAN: 116 mL/min/{1.73_m2} (ref >59)
GLUCOSE: 343 mg/dL — AB (ref 65–99)
Potassium: 4.6 mmol/L (ref 3.5–5.2)
SODIUM: 139 mmol/L (ref 134–144)
TOTAL PROTEIN: 7 g/dL (ref 6.0–8.5)

## 2016-08-09 LAB — CBC WITH DIFFERENTIAL/PLATELET
BASOS ABS: 0 10*3/uL (ref 0.0–0.2)
Basos: 0 %
EOS (ABSOLUTE): 0.2 10*3/uL (ref 0.0–0.4)
EOS: 2 %
HEMATOCRIT: 40.4 % (ref 37.5–51.0)
Hemoglobin: 13.4 g/dL (ref 13.0–17.7)
IMMATURE GRANULOCYTES: 0 %
Immature Grans (Abs): 0 10*3/uL (ref 0.0–0.1)
LYMPHS ABS: 2.2 10*3/uL (ref 0.7–3.1)
Lymphs: 26 %
MCH: 29.4 pg (ref 26.6–33.0)
MCHC: 33.2 g/dL (ref 31.5–35.7)
MCV: 89 fL (ref 79–97)
MONOS ABS: 0.6 10*3/uL (ref 0.1–0.9)
Monocytes: 7 %
NEUTROS PCT: 65 %
Neutrophils Absolute: 5.5 10*3/uL (ref 1.4–7.0)
PLATELETS: 229 10*3/uL (ref 150–379)
RBC: 4.56 x10E6/uL (ref 4.14–5.80)
RDW: 14.8 % (ref 12.3–15.4)
WBC: 8.4 10*3/uL (ref 3.4–10.8)

## 2016-08-09 LAB — MICROALBUMIN / CREATININE URINE RATIO
Creatinine, Urine: 104.4 mg/dL
MICROALB/CREAT RATIO: 13.8 mg/g{creat} (ref 0.0–30.0)
Microalbumin, Urine: 14.4 ug/mL

## 2016-08-09 MED FILL — !NOVOLOG FLEXPEN SYRINGE 1: 100/ML | 15 days supply | Qty: 6 | Fill #0

## 2016-08-09 MED FILL — !LEVEMIR FLEXPEN 100UNITS/M: 100U/ML (3) | 14 days supply | Qty: 6 | Fill #0

## 2016-08-15 ENCOUNTER — Telehealth: Payer: Self-pay | Admitting: Licensed Clinical Social Worker

## 2016-08-15 NOTE — Telephone Encounter (Signed)
LCSWA attempted to contact pt to follow up on behavioral health screens from prior appointment. LCSWA left message for a return call.  

## 2016-08-17 ENCOUNTER — Telehealth: Payer: Self-pay

## 2016-08-17 ENCOUNTER — Telehealth: Payer: Self-pay | Admitting: Licensed Clinical Social Worker

## 2016-08-17 NOTE — Telephone Encounter (Signed)
Contacted pt to go over lab results pt was unavailable left message to give me a call back at his earliest convenience    If pt calls back please give results: Blood count nml, kidney/liver fn good, out of control diabetes needs tighter control, but fortunately no signs of microscopic kidney damage yet. Need tighter sugar controls to prevent future kidney damage.

## 2016-08-17 NOTE — Telephone Encounter (Signed)
LCSWA attempted to contact pt to follow up on behavioral health screens from prior appointment. LCSWA left message for a return call.  

## 2016-08-20 NOTE — Addendum Note (Signed)
Addendum  created 08/20/16 1030 by Myrtie Soman, MD   Sign clinical note

## 2016-08-22 ENCOUNTER — Ambulatory Visit: Payer: Self-pay | Admitting: Pharmacist

## 2016-08-24 ENCOUNTER — Encounter (HOSPITAL_COMMUNITY): Payer: Self-pay | Admitting: Emergency Medicine

## 2016-08-24 ENCOUNTER — Emergency Department (HOSPITAL_COMMUNITY): Payer: Self-pay

## 2016-08-24 ENCOUNTER — Emergency Department (HOSPITAL_COMMUNITY)
Admission: EM | Admit: 2016-08-24 | Discharge: 2016-08-24 | Disposition: A | Discharge status: Home / Self Care | Payer: Self-pay | Attending: Physician Assistant | Admitting: Physician Assistant

## 2016-08-24 DIAGNOSIS — J45909 Unspecified asthma, uncomplicated: Secondary | ICD-10-CM | POA: Insufficient documentation

## 2016-08-24 DIAGNOSIS — Z794 Long term (current) use of insulin: Secondary | ICD-10-CM | POA: Insufficient documentation

## 2016-08-24 DIAGNOSIS — R739 Hyperglycemia, unspecified: Secondary | ICD-10-CM

## 2016-08-24 DIAGNOSIS — I10 Essential (primary) hypertension: Secondary | ICD-10-CM | POA: Insufficient documentation

## 2016-08-24 DIAGNOSIS — E1165 Type 2 diabetes mellitus with hyperglycemia: Secondary | ICD-10-CM | POA: Insufficient documentation

## 2016-08-24 DIAGNOSIS — Z79899 Other long term (current) drug therapy: Secondary | ICD-10-CM | POA: Insufficient documentation

## 2016-08-24 LAB — BASIC METABOLIC PANEL
ANION GAP: 11 (ref 5–15)
BUN: 16 mg/dL (ref 6–20)
CALCIUM: 9.4 mg/dL (ref 8.9–10.3)
CO2: 26 mmol/L (ref 22–32)
CREATININE: 1.13 mg/dL (ref 0.61–1.24)
Chloride: 94 mmol/L — ABNORMAL LOW (ref 101–111)
Glucose, Bld: 347 mg/dL — ABNORMAL HIGH (ref 65–99)
Potassium: 4 mmol/L (ref 3.5–5.1)
SODIUM: 131 mmol/L — AB (ref 135–145)

## 2016-08-24 LAB — CBC
HCT: 39 % (ref 39.0–52.0)
HEMOGLOBIN: 14 g/dL (ref 13.0–17.0)
MCH: 29.6 pg (ref 26.0–34.0)
MCHC: 35.9 g/dL (ref 30.0–36.0)
MCV: 82.5 fL (ref 78.0–100.0)
PLATELETS: 219 10*3/uL (ref 150–400)
RBC: 4.73 MIL/uL (ref 4.22–5.81)
RDW: 12.7 % (ref 11.5–15.5)
WBC: 10.1 10*3/uL (ref 4.0–10.5)

## 2016-08-24 LAB — I-STAT VENOUS BLOOD GAS, ED
Acid-Base Excess: 3 mmol/L — ABNORMAL HIGH (ref 0.0–2.0)
BICARBONATE: 25.8 mmol/L (ref 20.0–28.0)
O2 Saturation: 63 %
PCO2 VEN: 33.1 mmHg — AB (ref 44.0–60.0)
PH VEN: 7.499 — AB (ref 7.250–7.430)
TCO2: 27 mmol/L (ref 0–100)
pO2, Ven: 29 mmHg — CL (ref 32.0–45.0)

## 2016-08-24 LAB — I-STAT TROPONIN, ED: TROPONIN I, POC: 0.01 ng/mL (ref 0.00–0.08)

## 2016-08-24 LAB — I-STAT CG4 LACTIC ACID, ED
Lactic Acid, Venous: 2.18 mmol/L (ref 0.5–1.9)
Lactic Acid, Venous: 0.71 mmol/L (ref 0.5–1.9)

## 2016-08-24 LAB — CBG MONITORING, ED: GLUCOSE-CAPILLARY: 342 mg/dL — AB (ref 65–99)

## 2016-08-24 MED ORDER — SODIUM CHLORIDE 0.9 % IV BOLUS (SEPSIS)
1000.0000 mL | Freq: Once | INTRAVENOUS | Status: AC
  Administered 2016-08-24: 1000 mL via INTRAVENOUS

## 2016-08-24 NOTE — ED Triage Notes (Signed)
Per EMS, pt from home. Pt reports he was working in the yard today and started getting weak and lightheaded with some sob. Pt reports hx of diabetes, recent med change and has not been compliant. EMS CBG 408, pt reports his CBG is normally in the 300s. Fire reported pt's bp 86/50, pt received 570ml NS and BP went up to 130/70. Pt 100% on 3L, was found wearing O2 when EMS arrived. Does not wear O2 normally. Pt also c/o rt sided rib pain 7/10 during inspiration, pt states he hit his side on a couch recently.

## 2016-08-24 NOTE — ED Notes (Signed)
Patient transported to X-ray 

## 2016-08-24 NOTE — ED Provider Notes (Signed)
[facility] DEPT Provider Note   CSN: [id number] Arrival date & time: 08/24/16  [facility]     History   Chief Complaint Chief Complaint  Patient presents with  . Hyperglycemia    HPI [name] [name] is a 31 y.o. male.  HPI   Patient is a 31 year old male with diabetes and hypertension, past medical history significant also for chronic wounds. Presenting today with feelings of fatigue and lightheadedness while working outside. No syncope. Patient was working in the hot sun doing yard work and felt lightheaded. Uterus was called patient's sugar was elevated at 340. He reports this is normal for him. Patient is tearful on exam and arguing with wife over the phone on both occasions that I went to to see patient. He states he feels better that he is in a cool environment.Michela Pitcher no recent signs of infection or symptoms.  Past Medical History:  Diagnosis Date  . Asthma   . Diabetes mellitus without complication ([place])   . Hypertension     Patient Active Problem List   Diagnosis Date Noted  . Neck pain   . Pelvic abscess in male [facility]) 04/12/2016  . Wound infection 04/11/2016  . Asthma 04/11/2016  . Hyperglycemia   . Sepsis due to Streptococcus agalactiae ([place])   . Enterococcus faecalis infection   . Uncontrolled type 2 diabetes mellitus with complication ([place])   . Acute renal failure ([place])   . Hypotension   . Sepsis, unspecified organism ([place]) 02/22/2016  . DKA (diabetic ketoacidoses) ([place]) 02/22/2016  . AKI (acute kidney injury) ([place]) 09/09/2015  . Cellulitis   . Abscess of right shoulder 09/06/2015  . Normocytic anemia 09/06/2015  . Obesity, morbid ([place]) 09/06/2015  . Hypertension 09/06/2015  . Recurrent boils 09/06/2015  . MRSA (methicillin resistant staph aureus) culture positive 09/04/2015  . Abscess of left shoulder 08/31/2015  . Uncontrolled diabetes mellitus ([place]) 08/31/2015  . Abscess of right arm 08/31/2015    Past Surgical History:  Procedure Laterality Date  .  DRESSING CHANGE UNDER ANESTHESIA N/A 04/13/2016   Procedure: DRESSING CHANGE UNDER ANESTHESIA;  Surgeon: Johnathan Hausen, MD;  Location: [facility];  Service: General;  Laterality: N/A;  . HERNIA REPAIR     x2 when he was a baby  . INCISION AND DRAINAGE ABSCESS Right 09/03/2015   Procedure: INCISION AND DRAINAGE ABSCESS;  Surgeon: Roseanne Kaufman, MD;  Location: [facility];  Service: Orthopedics;  Laterality: Right;  . INCISION AND DRAINAGE PERIRECTAL ABSCESS N/A 04/11/2016   Procedure: IRRIGATION AND DEBRIDEMENT RECTAL ABSCESS;  Surgeon: Johnathan Hausen, MD;  [facility];  Service: General;  Laterality: N/A;  . IRRIGATION AND DEBRIDEMENT ABSCESS Left 08/31/2015   Procedure: IRRIGATION AND DEBRIDEMENT ABSCESS LEFT SHOULDER;  Surgeon: Alphonsa Overall, MD;  Location: [facility];  Service: General;  Laterality: Left;  . WOUND DEBRIDEMENT N/A 02/24/2016   Procedure: SACRAL WOUND DEBRIDEMENT;  Surgeon: Georganna Skeans, MD;  Location: [facility];  Service: General;  Laterality: N/A;       Home Medications    Prior to Admission medications   Medication Sig Start Date End Date Taking? Authorizing Provider  acetaminophen (TYLENOL) 325 MG tablet Take 2 tablets (650 mg total) by mouth every 6 (six) hours as needed for mild pain or fever (for pain). 04/17/16  Yes Regalado, Belkys A, MD  amLODipine (NORVASC) 5 MG tablet Take 1 tablet (5 mg total) by mouth daily. 08/08/16  Yes Langeland, Leda Quail, MD  Blood Glucose Monitoring Suppl (TRUE METRIX GO GLUCOSE METER) w/Device  KIT 1 each by Does not apply route every 8 (eight) hours as needed. 04/10/16  Yes Langeland, Dawn T, MD  cyclobenzaprine (FLEXERIL) 10 MG tablet Take 1 tablet (10 mg total) by mouth 3 (three) times daily as needed for muscle spasms. Watch for sedation 08/08/16  Yes Langeland, Dawn T, MD  escitalopram (LEXAPRO) 20 MG tablet Take 1 tablet (20 mg total) by mouth at bedtime. 08/08/16  Yes Maren Reamer, MD  ferrous sulfate 325 (65 FE) MG tablet Take 1 tablet (325 mg  total) by mouth daily with breakfast. 04/17/16  Yes Regalado, Belkys A, MD  gabapentin (NEURONTIN) 100 MG capsule Take 1 capsule (100 mg total) by mouth at bedtime. 08/08/16  Yes Langeland, Dawn T, MD  glucose blood test strip Use as instructed 04/10/16  Yes Langeland, Dawn T, MD  metFORMIN (GLUCOPHAGE XR) 500 MG 24 hr tablet Take 1 tablet (500 mg total) by mouth 2 (two) times daily. 08/08/16  Yes Langeland, Dawn T, MD  pravastatin (PRAVACHOL) 20 MG tablet Take 1 tablet (20 mg total) by mouth daily. 08/08/16  Yes Langeland, Dawn T, MD  TRUEPLUS LANCETS 26G MISC 1 each by Does not apply route every 8 (eight) hours as needed. 04/10/16  Yes Langeland, Dawn T, MD  insulin aspart (NOVOLOG FLEXPEN) 100 UNIT/ML FlexPen Inject 20 units 15 minutes before your two largest meals of the day Patient not taking: Reported on 08/24/2016 08/08/16   Lottie Mussel T, MD  Insulin Detemir (LEVEMIR FLEXPEN) 100 UNIT/ML Pen Inject 20 Units into the skin 2 (two) times daily. Patient not taking: Reported on 08/24/2016 08/08/16   Lottie Mussel T, MD  Insulin Pen Needle (ULTICARE MINI PEN NEEDLES) 31G X 6 MM MISC Use as directed Patient not taking: Reported on 08/24/2016 04/24/16   Maren Reamer, MD  Insulin Syringe 27G X 1/2" 1 ML MISC 1 application by Does not apply route 2 (two) times daily. Patient not taking: Reported on 08/24/2016 04/10/16   Maren Reamer, MD    Family History Family History  Problem Relation Age of Onset  . Diabetes type II Mother   . Diabetes type II Father     Social History Social History  Substance Use Topics  . Smoking status: Never Smoker  . Smokeless tobacco: Never Used  . Alcohol use No     Allergies   Patient has no known allergies.   Review of Systems Review of Systems  Constitutional: Positive for fatigue. Negative for activity change.  Respiratory: Negative for shortness of breath.   Cardiovascular: Negative for chest pain.  Gastrointestinal: Negative for abdominal pain.    Neurological: Positive for light-headedness.     Physical Exam Updated Vital Signs BP (!) 134/97 (BP Location: Right Arm)   Pulse (!) 102   Temp 98.3 F (36.8 C) (Oral)   Resp 18   Ht _0  (1.702 m)   Wt 93 kg (205 lb)   SpO2 100%   BMI 32.11 kg/m   Physical Exam  Constitutional: He is oriented to person, place, and time. He appears well-nourished.  HENT:  Head: Normocephalic.  Eyes: Conjunctivae are normal.  Cardiovascular: Normal rate and regular rhythm.   No murmur heard. Pulmonary/Chest: Effort normal and breath sounds normal. No respiratory distress.  Abdominal: Soft. He exhibits no distension. There is no tenderness.  Neurological: He is oriented to person, place, and time. No cranial nerve deficit. Coordination normal.  Skin: Skin is warm and dry. He is not diaphoretic.  multiple  old scars no evidence of current infection.  Psychiatric: He has a normal mood and affect. His behavior is normal.  Tearful. Denies SI or HI.     ED Treatments / Results  Labs (all labs ordered are listed, but only abnormal results are displayed) Labs Reviewed  BASIC METABOLIC PANEL - Abnormal; Notable for the following:       Result Value   Sodium 131 (*)    Chloride 94 (*)    Glucose, Bld 347 (*)    All other components within normal limits  CBG MONITORING, ED - Abnormal; Notable for the following:    Glucose-Capillary 342 (*)    All other components within normal limits  I-STAT VENOUS BLOOD GAS, ED - Abnormal; Notable for the following:    pH, Ven 7.499 (*)    pCO2, Ven 33.1 (*)    pO2, Ven 29.0 (*)    Acid-Base Excess 3.0 (*)    All other components within normal limits  I-STAT CG4 LACTIC ACID, ED - Abnormal; Notable for the following:    Lactic Acid, Venous 2.18 (*)    All other components within normal limits  CBC  URINALYSIS, ROUTINE W REFLEX MICROSCOPIC  COMPREHENSIVE METABOLIC PANEL  CBC WITH DIFFERENTIAL/PLATELET  I-STAT TROPOININ, ED  I-STAT CG4 LACTIC ACID,  ED    EKG  EKG Interpretation None       Radiology Dg Chest 2 View  Result Date: 08/24/2016 CLINICAL DATA:  Onset of weakness, lightheadedness and shortness of breath today while working in the yard. EXAM: CHEST  2 VIEW COMPARISON:  Single-view of the chest 02/22/2016. CT chest 08/31/2015. FINDINGS: The lungs are clear. Heart size is normal. No pneumothorax or pleural effusion. No bony abnormality. IMPRESSION: Normal chest. Electronically Signed   By: Inge Rise M.D.   On: 08/24/2016 19:57    Procedures Procedures (including critical care time)  Medications Ordered in ED Medications  sodium chloride 0.9 % bolus 1,000 mL (0 mLs Intravenous Stopped 08/24/16 2107)  sodium chloride 0.9 % bolus 1,000 mL (0 mLs Intravenous Stopped 08/24/16 2212)     Initial Impression / Assessment and Plan / ED Course  I have reviewed the triage vital signs and the nursing notes.  Pertinent labs & imaging results that were available during my care of the patient were reviewed by me and considered in my medical decision making (see chart for details).     Patient is a well-appearing 31 year old male with history of diabetes. I think patient had heat-related presyncope. Patient has normal vital signs here except for mild tachycardia. Will give him fluids. We'll have him treat with his home insulin. We will have him return home and sure to stay hydrated working outside in the future.  Final Clinical Impressions(s) / ED Diagnoses   Final diagnoses:  Hyperglycemia    New Prescriptions Discharge Medication List as of 08/24/2016 10:39 PM       Macarthur Critchley, MD 08/24/16 2326

## 2016-08-24 NOTE — ED Notes (Signed)
Pt ambulated around the unit. Pt denies any weakness or dizziness. Pt has no complaints at this time.

## 2016-08-24 NOTE — ED Notes (Signed)
Pt on the phone with his wife multiple times. Pt crying and upset throughout conversation.

## 2016-08-24 NOTE — Discharge Instructions (Signed)
Please follow up with your primary care provider. Please return with any concerns.

## 2016-09-07 ENCOUNTER — Ambulatory Visit: Payer: Self-pay | Admitting: Internal Medicine

## 2016-10-04 ENCOUNTER — Encounter: Payer: Self-pay | Admitting: Internal Medicine

## 2016-10-04 ENCOUNTER — Ambulatory Visit: Payer: Self-pay | Attending: Internal Medicine | Admitting: Internal Medicine

## 2016-10-04 VITALS — BP 121/84 | HR 100 | Temp 98.1°F | Resp 16 | Wt 208.2 lb

## 2016-10-04 DIAGNOSIS — I10 Essential (primary) hypertension: Secondary | ICD-10-CM

## 2016-10-04 DIAGNOSIS — E118 Type 2 diabetes mellitus with unspecified complications: Secondary | ICD-10-CM

## 2016-10-04 DIAGNOSIS — Z794 Long term (current) use of insulin: Secondary | ICD-10-CM | POA: Insufficient documentation

## 2016-10-04 DIAGNOSIS — D649 Anemia, unspecified: Secondary | ICD-10-CM | POA: Insufficient documentation

## 2016-10-04 DIAGNOSIS — E785 Hyperlipidemia, unspecified: Secondary | ICD-10-CM

## 2016-10-04 DIAGNOSIS — IMO0002 Reserved for concepts with insufficient information to code with codable children: Secondary | ICD-10-CM

## 2016-10-04 DIAGNOSIS — Z833 Family history of diabetes mellitus: Secondary | ICD-10-CM | POA: Insufficient documentation

## 2016-10-04 DIAGNOSIS — E1110 Type 2 diabetes mellitus with ketoacidosis without coma: Secondary | ICD-10-CM | POA: Insufficient documentation

## 2016-10-04 DIAGNOSIS — A409 Streptococcal sepsis, unspecified: Secondary | ICD-10-CM | POA: Insufficient documentation

## 2016-10-04 DIAGNOSIS — M542 Cervicalgia: Secondary | ICD-10-CM | POA: Insufficient documentation

## 2016-10-04 DIAGNOSIS — F32A Depression, unspecified: Secondary | ICD-10-CM

## 2016-10-04 DIAGNOSIS — I959 Hypotension, unspecified: Secondary | ICD-10-CM | POA: Insufficient documentation

## 2016-10-04 DIAGNOSIS — K029 Dental caries, unspecified: Secondary | ICD-10-CM

## 2016-10-04 DIAGNOSIS — J45909 Unspecified asthma, uncomplicated: Secondary | ICD-10-CM | POA: Insufficient documentation

## 2016-10-04 DIAGNOSIS — Z79899 Other long term (current) drug therapy: Secondary | ICD-10-CM | POA: Insufficient documentation

## 2016-10-04 DIAGNOSIS — E1165 Type 2 diabetes mellitus with hyperglycemia: Secondary | ICD-10-CM

## 2016-10-04 DIAGNOSIS — N179 Acute kidney failure, unspecified: Secondary | ICD-10-CM | POA: Insufficient documentation

## 2016-10-04 DIAGNOSIS — F329 Major depressive disorder, single episode, unspecified: Secondary | ICD-10-CM | POA: Insufficient documentation

## 2016-10-04 DIAGNOSIS — S80211A Abrasion, right knee, initial encounter: Secondary | ICD-10-CM

## 2016-10-04 DIAGNOSIS — E1142 Type 2 diabetes mellitus with diabetic polyneuropathy: Secondary | ICD-10-CM

## 2016-10-04 DIAGNOSIS — Z9889 Other specified postprocedural states: Secondary | ICD-10-CM | POA: Insufficient documentation

## 2016-10-04 DIAGNOSIS — F1290 Cannabis use, unspecified, uncomplicated: Secondary | ICD-10-CM

## 2016-10-04 LAB — GLUCOSE, POCT (MANUAL RESULT ENTRY)
POC Glucose: 514 mg/dl — AB (ref 70–99)
POC Glucose: 387 mg/dl — AB (ref 70–99)

## 2016-10-04 MED ORDER — SODIUM CHLORIDE 0.9 % IV SOLN
INTRAVENOUS | Status: DC
  Administered 2016-10-04: 15:00:00 via INTRAVENOUS

## 2016-10-04 MED ORDER — PRAVASTATIN SODIUM 20 MG PO TABS: 20.0000 mg | ORAL_TABLET | Freq: Every day | ORAL | 3 refills | Status: DC

## 2016-10-04 MED ORDER — INSULIN PEN NEEDLE 31G X 6 MM MISC
5 refills | Status: DC
Start: 2016-10-04 — End: 2016-11-04

## 2016-10-04 MED ORDER — METFORMIN HCL ER 500 MG PO TB24: 500.0000 mg | ORAL_TABLET | Freq: Two times a day (BID) | ORAL | 6 refills | Status: DC

## 2016-10-04 MED ORDER — MUPIROCIN CALCIUM 2 % EX CREA: 1.0000 "application " | TOPICAL_CREAM | Freq: Two times a day (BID) | CUTANEOUS | 0 refills | Status: DC

## 2016-10-04 MED ORDER — GABAPENTIN 300 MG PO CAPS: 300.0000 mg | ORAL_CAPSULE | Freq: Every day | ORAL | 6 refills | Status: DC

## 2016-10-04 MED ORDER — ESCITALOPRAM OXALATE 20 MG PO TABS: 20.0000 mg | ORAL_TABLET | Freq: Every day | ORAL | 6 refills | Status: DC

## 2016-10-04 MED ORDER — INSULIN ASPART 100 UNIT/ML ~~LOC~~ SOLN: 20.0000 [IU] | Freq: Once | SUBCUTANEOUS | Status: DC

## 2016-10-04 MED FILL — METFORMIN HCL ER 500 MG TAB: 500 | 30 days supply | Qty: 60 | Fill #0

## 2016-10-04 MED FILL — TRUEPLUS PEN NDL 31G X 1/4: 31G X 6 MM | 30 days supply | Qty: 100 | Fill #0

## 2016-10-04 MED FILL — TRUEPLUS PEN NDL 31G X 1/4": 31G X 6 MM | 30 days supply | Qty: 100 | Fill #0

## 2016-10-04 MED FILL — GABAPENTIN 300 MG CAPSULE: 300 | 30 days supply | Qty: 30 | Fill #0

## 2016-10-04 MED FILL — ESCITALOPRAM 20 MG TABLET: 20 | 30 days supply | Qty: 30 | Fill #0

## 2016-10-04 MED FILL — MUPIROCIN 2% OINTMENT: 2 | 15 days supply | Qty: 22 | Fill #0

## 2016-10-04 MED FILL — PRAVASTATIN NA 20 MG TAB: 20 | 30 days supply | Qty: 30 | Fill #0

## 2016-10-04 NOTE — Patient Instructions (Signed)
Please give appointment with Stacy in 2 weeks.  Start taking Levemir and Novolog as previously prescribed.  I have sent prescription to pharmacy for your needles. Check your blood sugars twice a day and bring in readings in 2 weeks when you meet with the clinical pharmacist. Stop Amlodipine for now as your blood pressure is controlled.   Follow a Healthy Eating Plan - You can do it! Limit sugary drinks.  Avoid sodas, sweet tea, sport or energy drinks, or fruit drinks.  Drink water, lo-fat milk, or diet drinks. Limit snack foods.   Cut back on candy, cake, cookies, chips, ice cream.  These are a special treat, only in small amounts. Eat plenty of vegetables.  Especially dark green, red, and orange vegetables. Aim for at least 3 servings a day. More is better! Include fruit in your daily diet.  Whole fruit is much healthier than fruit juice! Limit "white" bread, "white" pasta, "white" rice.   Choose "100% whole grain" products, brown or wild rice. Avoid fatty meats. Try "Meatless Monday" and choose eggs or beans one day a week.  When eating meat, choose lean meats like chicken, [place], and fish.  Grill, broil, or bake meats instead of frying, and eat poultry without the skin. Eat less salt.  Avoid frozen pizzas, frozen dinners and salty foods.  Use seasonings other than salt in cooking.  This can help blood pressure and keep you from swelling Beer, wine and liquor have calories.  If you can safely drink alcohol, limit to 1 drink per day for women, 2 drinks for men

## 2016-10-04 NOTE — Progress Notes (Signed)
Patient ID: [name] [name], male    DOB: March 24, 1985  MRN: [id number]  CC: re-establish and Diabetes   Subjective: [name] [name] is a 31 y.o. male who presents for f/u visit and to become est with me as PCP. Wife [name] [name] is with her. His concerns today include:  Hx of DM, HL, HTN, depression, pelvic abscess due to MRSA, anemia but last H/H normal. Seen in ED last mth with heat related pre-syncopy.  Has transportation issues in getting to appointments.  1. DM: -has not used insulin since May because he has no needles for his pens. Did not come in sooner to get needles because he lacked transportation  -taking Metformin. -endorses numbness and pain in both feet. [place] a few times scraping RT  knee and elbow.  Wife notices he dragges RT foot for several mths. Started after he had surgery on buttock/lower back for abscess.Out of Neurontin x 1-2 wks.  this helps with the numbness and pain in his feet. Trying to get disability. He has been denied twice but has appealed. -+polyuria/dipsia. Sweats a lot even in [facility] -Drinks mainly water. Tries to stay away from sweet treats.    2.  HTN: out of norvasc 1-2 wks  3. HL: out of Pravastatin  4. Depression:  "I'm going through a lot of depression." -request RF LExapro. Uses marijuana to control stress 1-2 x a wk  Patient Active Problem List   Diagnosis Date Noted  . Neck pain   . Pelvic abscess in male [facility]) 04/12/2016  . Wound infection 04/11/2016  . Asthma 04/11/2016  . Hyperglycemia   . Sepsis due to Streptococcus agalactiae ([place])   . Enterococcus faecalis infection   . Uncontrolled type 2 diabetes mellitus with complication ([place])   . Acute renal failure ([place])   . Hypotension   . Sepsis, unspecified organism ([place]) 02/22/2016  . DKA (diabetic ketoacidoses) ([place]) 02/22/2016  . AKI (acute kidney injury) ([place]) 09/09/2015  . Cellulitis   . Abscess of right shoulder 09/06/2015  . Normocytic anemia 09/06/2015  . Obesity, morbid ([place]) 09/06/2015   . Hypertension 09/06/2015  . Recurrent boils 09/06/2015  . MRSA (methicillin resistant staph aureus) culture positive 09/04/2015  . Abscess of left shoulder 08/31/2015  . Uncontrolled diabetes mellitus ([place]) 08/31/2015  . Abscess of right arm 08/31/2015     Current Outpatient Prescriptions on File Prior to Visit  Medication Sig Dispense Refill  . acetaminophen (TYLENOL) 325 MG tablet Take 2 tablets (650 mg total) by mouth every 6 (six) hours as needed for mild pain or fever (for pain). 10 tablet 0  . Blood Glucose Monitoring Suppl (TRUE METRIX GO GLUCOSE METER) w/Device KIT 1 each by Does not apply route every 8 (eight) hours as needed. 1 kit 0  . glucose blood test strip Use as instructed 100 each 12  . insulin aspart (NOVOLOG FLEXPEN) 100 UNIT/ML FlexPen Inject 20 units 15 minutes before your two largest meals of the day (Patient not taking: Reported on 08/24/2016) 15 mL 3  . Insulin Detemir (LEVEMIR FLEXPEN) 100 UNIT/ML Pen Inject 20 Units into the skin 2 (two) times daily. (Patient not taking: Reported on 08/24/2016) 15 mL 3  . TRUEPLUS LANCETS 26G MISC 1 each by Does not apply route every 8 (eight) hours as needed. 100 each 12   Current Facility-Administered Medications on File Prior to Visit  Medication Dose Route Frequency Provider Last Rate Last Dose  . insulin aspart (novoLOG) injection 20 Units  20 Units Subcutaneous  Once Maren Reamer, MD        No Known Allergies  Social History   Social History  . Marital status: Married    Spouse name: N/A  . Number of children: N/A  . Years of education: N/A   Occupational History  . Not on file.   Social History Main Topics  . Smoking status: Never Smoker  . Smokeless tobacco: Never Used  . Alcohol use No  . Drug use: Yes    Types: Marijuana  . Sexual activity: Yes    Birth control/ protection: Diaphragm   Other Topics Concern  . Not on file   Social History Narrative  . No narrative on file    Family History    Problem Relation Age of Onset  . Diabetes type II Mother   . Diabetes type II Father     Past Surgical History:  Procedure Laterality Date  . DRESSING CHANGE UNDER ANESTHESIA N/A 04/13/2016   Procedure: DRESSING CHANGE UNDER ANESTHESIA;  Surgeon: Johnathan Hausen, MD;  [facility];  Service: General;  Laterality: N/A;  . HERNIA REPAIR     x2 when he was a baby  . INCISION AND DRAINAGE ABSCESS Right 09/03/2015   Procedure: INCISION AND DRAINAGE ABSCESS;  Surgeon: Roseanne Kaufman, MD;  [facility];  Service: Orthopedics;  Laterality: Right;  . INCISION AND DRAINAGE PERIRECTAL ABSCESS N/A 04/11/2016   Procedure: IRRIGATION AND DEBRIDEMENT RECTAL ABSCESS;  Surgeon: Johnathan Hausen, MD;  [facility];  Service: General;  Laterality: N/A;  . IRRIGATION AND DEBRIDEMENT ABSCESS Left 08/31/2015   Procedure: IRRIGATION AND DEBRIDEMENT ABSCESS LEFT SHOULDER;  Surgeon: Alphonsa Overall, MD;  [facility];  Service: General;  Laterality: Left;  . WOUND DEBRIDEMENT N/A 02/24/2016   Procedure: SACRAL WOUND DEBRIDEMENT;  Surgeon: Georganna Skeans, MD;  Location: [facility];  Service: General;  Laterality: N/A;    ROS: Review of Systems Patient denies any chest pain or shortness of breath. Some nausea about 3-4 days ago Suicidal ideation in the past but not currently PHYSICAL EXAM: BP 121/84   Pulse 100   Temp 98.1 F (36.7 C) (Oral)   Resp 16   Wt 208 lb 3.2 oz (94.4 kg)   SpO2 96%   BMI 32.61 kg/m  BP 118/80 Physical Exam General appearance - alert, well appearing, and in no distress Mental status - alert, oriented to person, place, and time, normal mood, behavior, speech, dress, motor activity, and thought processes Mouth - mucous membranes moist, pharynx normal without lesions Neck - supple, no significant adenopathy Chest - clear to auscultation, no wheezes, rales or rhonchi, symmetric air entry Heart - normal rate, regular rhythm, normal S1, S2, no murmurs, rubs, clicks or  gallops Extremities - no LE edema Skin - several healed abrasions over the lower and upper extremities. 3 cm extensive abrasion on the right knee lateral to patella. Pink base. No drainage  Results for orders placed or performed in visit on 10/04/16  POCT glucose (manual entry)  Result Value Ref Range   POC Glucose 514 (A) 70 - 99 mg/dl  POCT glucose (manual entry)  Result Value Ref Range   POC Glucose 387 (A) 70 - 99 mg/dl   Depression screen PHQ 2/9 08/08/2016  Decreased Interest 0  Down, Depressed, Hopeless 2  PHQ - 2 Score 2  Altered sleeping 2  Tired, decreased energy 2  Change in appetite 1  Feeling bad or failure about yourself  1  Trouble concentrating 0  Moving slowly or fidgety/restless 0  Suicidal thoughts 1  PHQ-9 Score 9    ASSESSMENT AND PLAN: 1. Uncontrolled type 2 diabetes mellitus with complication, with long-term current use of insulin (HCC) Discussed the importance of healthy eating habits, regular aerobic exercise (at least 150 minutes a week as tolerated) and medication compliance to achieve or maintain control of diabetes and prevent further complications. -Patient given 20 units of NovoLog and 1 L of normal saline today in the clinic. He was unable to produce any urine to check for ketones -He will stop at the pharmacy today to get needles for the insulin pen. He will resume previous dose of NovoLog and Lantus. -Requested that he follow up in 2 weeks with our clinical pharmacists and bring in blood sugar readings with him for further titration of insulin - POCT glucose (manual entry) - Insulin Pen Needle (ULTICARE MINI PEN NEEDLES) 31G X 6 MM MISC; Use as directed  Dispense: 100 each; Refill: 5 - metFORMIN (GLUCOPHAGE XR) 500 MG 24 hr tablet; Take 1 tablet (500 mg total) by mouth 2 (two) times daily.  Dispense: 60 tablet; Refill: 6 - Basic Metabolic Panel - Ambulatory referral to Ophthalmology - insulin aspart (novoLOG) injection 20 Units; Inject 0.2 mLs (20  Units total) into the skin once. - 0.9 %  sodium chloride infusion; Inject into the vein continuous. - POCT glucose (manual entry)  2. Diabetic polyneuropathy associated with type 2 diabetes mellitus (HCC) -Increased dose of Neurontin to 300 mg at bedtime - gabapentin (NEURONTIN) 300 MG capsule; Take 1 capsule (300 mg total) by mouth at bedtime.  Dispense: 30 capsule; Refill: 6  3. Hypertension, benign -Will  leave him off of Norvasc for now as blood pressure controlled without it  4. Depression, unspecified depression type - escitalopram (LEXAPRO) 20 MG tablet; Take 1 tablet (20 mg total) by mouth at bedtime.  Dispense: 30 tablet; Refill: 6  5. Dental cavities - Ambulatory referral to Dentistry  6. Marijuana use -Discourage use. Discussed other ways to deal with stress  7. Abrasion of right knee, initial encounter - mupirocin cream (BACTROBAN) 2 %; Apply 1 application topically 2 (two) times daily.  Dispense: 15 g; Refill: 0  8. Hyperlipidemia, unspecified hyperlipidemia type - pravastatin (PRAVACHOL) 20 MG tablet; Take 1 tablet (20 mg total) by mouth daily.  Dispense: 90 tablet; Refill: 3  Patient was given the opportunity to ask questions.  Patient verbalized understanding of the plan and was able to repeat key elements of the plan.   Orders Placed This Encounter  Procedures  . Basic Metabolic Panel  . Ambulatory referral to Dentistry  . Ambulatory referral to Ophthalmology  . POCT glucose (manual entry)  . POCT glucose (manual entry)     Requested Prescriptions   Signed Prescriptions Disp Refills  . escitalopram (LEXAPRO) 20 MG tablet 30 tablet 6    Sig: Take 1 tablet (20 mg total) by mouth at bedtime.  . gabapentin (NEURONTIN) 300 MG capsule 30 capsule 6    Sig: Take 1 capsule (300 mg total) by mouth at bedtime.  . Insulin Pen Needle (ULTICARE MINI PEN NEEDLES) 31G X 6 MM MISC 100 each 5    Sig: Use as directed  . pravastatin (PRAVACHOL) 20 MG tablet 90 tablet 3     Sig: Take 1 tablet (20 mg total) by mouth daily.  . metFORMIN (GLUCOPHAGE XR) 500 MG 24 hr tablet 60 tablet 6    Sig: Take 1 tablet (500 mg total) by  mouth 2 (two) times daily.  . mupirocin cream (BACTROBAN) 2 % 15 g 0    Sig: Apply 1 application topically 2 (two) times daily.    Return in about 6 weeks (around 11/15/2016).  Karle Plumber, MD, FACP

## 2016-10-05 LAB — BASIC METABOLIC PANEL
BUN/Creatinine Ratio: 17 (ref 9–20)
BUN: 13 mg/dL (ref 6–20)
CALCIUM: 8.9 mg/dL (ref 8.7–10.2)
CHLORIDE: 97 mmol/L (ref 96–106)
CO2: 23 mmol/L (ref 20–29)
Creatinine, Ser: 0.76 mg/dL (ref 0.76–1.27)
GFR calc Af Amer: 142 mL/min/{1.73_m2} (ref >59)
GFR calc non Af Amer: 122 mL/min/{1.73_m2} (ref >59)
GLUCOSE: 375 mg/dL — AB (ref 65–99)
Potassium: 4.2 mmol/L (ref 3.5–5.2)
Sodium: 136 mmol/L (ref 134–144)

## 2016-10-17 ENCOUNTER — Encounter: Payer: Self-pay | Admitting: Internal Medicine

## 2016-10-17 DIAGNOSIS — F329 Major depressive disorder, single episode, unspecified: Secondary | ICD-10-CM | POA: Insufficient documentation

## 2016-10-17 DIAGNOSIS — E785 Hyperlipidemia, unspecified: Secondary | ICD-10-CM | POA: Insufficient documentation

## 2016-10-17 DIAGNOSIS — F32A Depression, unspecified: Secondary | ICD-10-CM | POA: Insufficient documentation

## 2016-10-18 ENCOUNTER — Ambulatory Visit: Payer: Self-pay | Admitting: Internal Medicine

## 2016-10-27 ENCOUNTER — Encounter: Payer: Self-pay | Admitting: Internal Medicine

## 2016-10-27 DIAGNOSIS — E118 Type 2 diabetes mellitus with unspecified complications: Secondary | ICD-10-CM

## 2016-10-27 DIAGNOSIS — E1165 Type 2 diabetes mellitus with hyperglycemia: Secondary | ICD-10-CM | POA: Insufficient documentation

## 2016-10-29 ENCOUNTER — Encounter: Payer: Self-pay | Admitting: Internal Medicine

## 2016-10-29 ENCOUNTER — Ambulatory Visit: Payer: Self-pay | Attending: Internal Medicine | Admitting: Internal Medicine

## 2016-10-29 VITALS — BP 137/88 | HR 99 | Temp 98.3°F | Resp 16 | Wt 204.0 lb

## 2016-10-29 DIAGNOSIS — I10 Essential (primary) hypertension: Secondary | ICD-10-CM | POA: Insufficient documentation

## 2016-10-29 DIAGNOSIS — E1165 Type 2 diabetes mellitus with hyperglycemia: Secondary | ICD-10-CM

## 2016-10-29 DIAGNOSIS — E118 Type 2 diabetes mellitus with unspecified complications: Secondary | ICD-10-CM

## 2016-10-29 DIAGNOSIS — N3000 Acute cystitis without hematuria: Secondary | ICD-10-CM | POA: Insufficient documentation

## 2016-10-29 DIAGNOSIS — Z79899 Other long term (current) drug therapy: Secondary | ICD-10-CM | POA: Insufficient documentation

## 2016-10-29 DIAGNOSIS — Z9889 Other specified postprocedural states: Secondary | ICD-10-CM | POA: Insufficient documentation

## 2016-10-29 DIAGNOSIS — Z9114 Patient's other noncompliance with medication regimen: Secondary | ICD-10-CM | POA: Insufficient documentation

## 2016-10-29 DIAGNOSIS — F329 Major depressive disorder, single episode, unspecified: Secondary | ICD-10-CM | POA: Insufficient documentation

## 2016-10-29 DIAGNOSIS — L280 Lichen simplex chronicus: Secondary | ICD-10-CM | POA: Insufficient documentation

## 2016-10-29 DIAGNOSIS — L97522 Non-pressure chronic ulcer of other part of left foot with fat layer exposed: Secondary | ICD-10-CM

## 2016-10-29 DIAGNOSIS — M79604 Pain in right leg: Secondary | ICD-10-CM | POA: Insufficient documentation

## 2016-10-29 DIAGNOSIS — E11621 Type 2 diabetes mellitus with foot ulcer: Secondary | ICD-10-CM | POA: Insufficient documentation

## 2016-10-29 DIAGNOSIS — IMO0002 Reserved for concepts with insufficient information to code with codable children: Secondary | ICD-10-CM

## 2016-10-29 DIAGNOSIS — Z794 Long term (current) use of insulin: Secondary | ICD-10-CM | POA: Insufficient documentation

## 2016-10-29 DIAGNOSIS — E1142 Type 2 diabetes mellitus with diabetic polyneuropathy: Secondary | ICD-10-CM | POA: Insufficient documentation

## 2016-10-29 DIAGNOSIS — F32A Depression, unspecified: Secondary | ICD-10-CM

## 2016-10-29 LAB — POCT URINALYSIS DIPSTICK
Bilirubin, UA: NEGATIVE
Glucose, UA: 500
KETONES UA: NEGATIVE
NITRITE UA: POSITIVE
PH UA: 5.5 (ref 5.0–8.0)
PROTEIN UA: NEGATIVE
Spec Grav, UA: <=1.005 — AB (ref 1.010–1.025)
UROBILINOGEN UA: 0.2 U/dL

## 2016-10-29 LAB — GLUCOSE, POCT (MANUAL RESULT ENTRY)
POC GLUCOSE: 576 mg/dL — AB (ref 70–99)
POC GLUCOSE: 419 mg/dL — AB (ref 70–99)
POC GLUCOSE: 507 mg/dL — AB (ref 70–99)

## 2016-10-29 MED ORDER — AMOXICILLIN-POT CLAVULANATE 875-125 MG PO TABS: 1.0000 | ORAL_TABLET | Freq: Two times a day (BID) | ORAL | 0 refills | Status: DC

## 2016-10-29 MED ORDER — INSULIN DETEMIR 100 UNIT/ML FLEXPEN: 20.0000 [IU] | PEN_INJECTOR | Freq: Two times a day (BID) | SUBCUTANEOUS | 3 refills | Status: DC

## 2016-10-29 MED ORDER — INSULIN ASPART 100 UNIT/ML ~~LOC~~ SOLN
5.0000 [IU] | Freq: Once | SUBCUTANEOUS | Status: AC
  Administered 2016-10-29: 5 [IU] via SUBCUTANEOUS

## 2016-10-29 MED ORDER — GABAPENTIN 300 MG PO CAPS: 300.0000 mg | ORAL_CAPSULE | Freq: Three times a day (TID) | ORAL | 6 refills | Status: DC

## 2016-10-29 MED ORDER — GENTAMICIN SULFATE 0.1 % EX CREA: 1.0000 "application " | TOPICAL_CREAM | Freq: Every day | CUTANEOUS | 0 refills | Status: DC

## 2016-10-29 MED ORDER — INSULIN ASPART 100 UNIT/ML ~~LOC~~ SOLN
20.0000 [IU] | Freq: Once | SUBCUTANEOUS | Status: AC
  Administered 2016-10-29: 20 [IU] via SUBCUTANEOUS

## 2016-10-29 MED ORDER — SODIUM CHLORIDE 0.9 % IV BOLUS (SEPSIS)
1000.0000 mL | Freq: Once | INTRAVENOUS | Status: AC
  Administered 2016-10-29: 1000 mL via INTRAVENOUS

## 2016-10-29 MED ORDER — CIPROFLOXACIN HCL 500 MG PO TABS: 500.0000 mg | ORAL_TABLET | Freq: Two times a day (BID) | ORAL | 0 refills | Status: DC

## 2016-10-29 NOTE — Patient Instructions (Addendum)
Give appointment with [facility] in 1-2 weeks.  I have referred you to the [facility] for the ulcer on your left big toe. Do dressing changes once a day as discussed today.  Apply Gentamicin antibiotic cream to the ulcer once a day.    Wear gloves at nights to help prevent yourself from picking at the ulcer an other skin lesions.   Take both insulin twice a day as prescribed.  The medications will not work if you do not take them.  Increase Gabapentin to 300 mg three times a day to help decrease the numbness and burning in your feet.    Diabetes and Foot Care Diabetes may cause you to have problems because of poor blood supply (circulation) to your feet and legs. This may cause the skin on your feet to become thinner, break easier, and heal more slowly. Your skin may become dry, and the skin may peel and crack. You may also have nerve damage in your legs and feet causing decreased feeling in them. You may not notice minor injuries to your feet that could lead to infections or more serious problems. Taking care of your feet is one of the most important things you can do for yourself. Follow these instructions at home:  Wear shoes at all times, even in the house. Do not go barefoot. Bare feet are easily injured.  Check your feet daily for blisters, cuts, and redness. If you cannot see the bottom of your feet, use a mirror or ask someone for help.  Wash your feet with warm water (do not use hot water) and mild soap. Then pat your feet and the areas between your toes until they are completely dry. Do not soak your feet as this can dry your skin.  Apply a moisturizing lotion or petroleum jelly (that does not contain alcohol and is unscented) to the skin on your feet and to dry, brittle toenails. Do not apply lotion between your toes.  Trim your toenails straight across. Do not dig under them or around the cuticle. File the edges of your nails with an emery board or nail file.  Do not cut corns or  calluses or try to remove them with medicine.  Wear clean socks or stockings every day. Make sure they are not too tight. Do not wear knee-high stockings since they may decrease blood flow to your legs.  Wear shoes that fit properly and have enough cushioning. To break in new shoes, wear them for just a few hours a day. This prevents you from injuring your feet. Always look in your shoes before you put them on to be sure there are no objects inside.  Do not cross your legs. This may decrease the blood flow to your feet.  If you find a minor scrape, cut, or break in the skin on your feet, keep it and the skin around it clean and dry. These areas may be cleansed with mild soap and water. Do not cleanse the area with peroxide, alcohol, or iodine.  When you remove an adhesive bandage, be sure not to damage the skin around it.  If you have a wound, look at it several times a day to make sure it is healing.  Do not use heating pads or hot water bottles. They may burn your skin. If you have lost feeling in your feet or legs, you may not know it is happening until it is too late.  Make sure your health care provider performs a  complete foot exam at least annually or more often if you have foot problems. Report any cuts, sores, or bruises to your health care provider immediately. Contact a health care provider if:  You have an injury that is not healing.  You have cuts or breaks in the skin.  You have an ingrown nail.  You notice redness on your legs or feet.  You feel burning or tingling in your legs or feet.  You have pain or cramps in your legs and feet.  Your legs or feet are numb.  Your feet always feel cold. Get help right away if:  There is increasing redness, swelling, or pain in or around a wound.  There is a red line that goes up your leg.  Pus is coming from a wound.  You develop a fever or as directed by your health care provider.  You notice a bad smell coming from an  ulcer or wound. This information is not intended to replace advice given to you by your health care provider. Make sure you discuss any questions you have with your health care provider. Document Released: 03/02/2000 Document Revised: 08/11/2015 Document Reviewed: 08/12/2012 Elsevier Interactive Patient Education  2017 [employer].

## 2016-10-29 NOTE — Progress Notes (Signed)
Discussed with patient insulin pen use. Patient was able to demonstrate use but reports that his pen is jammed at 18 units. Patient also reports that he only uses his Levemir once a day since he usually sleeps all morning. Information given to Dr. Wynetta Emery.

## 2016-10-29 NOTE — Progress Notes (Signed)
Patient ID: [name] [name], male    DOB: 1985/10/13  MRN: [id number]  CC: Follow-up and Leg Pain   Subjective: [name] [name] is a 31 y.o. male who presents for f/u DM. Wife is with him His concerns today include:  Hx of DM type 2 with neuropathy, HTN, HL, depression Pt last seen 10/04/2016 with elevated BS after being out of insulin for 2 mths.  He was restarted on Levemir and Novolog.  1. DM: -suppose to take Levemir and Novolog 20 units BID but takes only once a day most days -not checking BS RT leg always jumping and burning in legs worse. Gabapentin at current dose dose not help -eating mainly noodles; all he can afford right now and does not get food stamps.   2. C/o blisters on LT big toe. He popped them.  Now has ulcer on toe. -+blood and pus drainage.  -constantly picks at the skin on lower extremity as a nervous habit. No itching per say  3. Depression: not better.  Taking Lexapro.  -depressed because wife hardly at home. She stays out with friends for days. Bored at home by himself  4. Had some dysuria for 1 day last wk.  Urine "was rusty looking." no penile dischg Patient Active Problem List   Diagnosis Date Noted  . Type 2 diabetes mellitus with diabetic neuropathy, unspecified ([place]) 10/27/2016  . Hyperlipidemia 10/17/2016  . Depression 10/17/2016  . Neck pain   . Asthma 04/11/2016  . Uncontrolled type 2 diabetes mellitus with complication ([place])   . Obesity, morbid ([place]) 09/06/2015  . Hypertension 09/06/2015     Current Outpatient Prescriptions on File Prior to Visit  Medication Sig Dispense Refill  . acetaminophen (TYLENOL) 325 MG tablet Take 2 tablets (650 mg total) by mouth every 6 (six) hours as needed for mild pain or fever (for pain). 10 tablet 0  . Blood Glucose Monitoring Suppl (TRUE METRIX GO GLUCOSE METER) w/Device KIT 1 each by Does not apply route every 8 (eight) hours as needed. 1 kit 0  . escitalopram (LEXAPRO) 20 MG tablet Take 1 tablet (20 mg total)  by mouth at bedtime. 30 tablet 6  . glucose blood test strip Use as instructed 100 each 12  . insulin aspart (NOVOLOG FLEXPEN) 100 UNIT/ML FlexPen Inject 20 units 15 minutes before your two largest meals of the day (Patient not taking: Reported on 08/24/2016) 15 mL 3  . Insulin Pen Needle (ULTICARE MINI PEN NEEDLES) 31G X 6 MM MISC Use as directed 100 each 5  . metFORMIN (GLUCOPHAGE XR) 500 MG 24 hr tablet Take 1 tablet (500 mg total) by mouth 2 (two) times daily. 60 tablet 6  . mupirocin cream (BACTROBAN) 2 % Apply 1 application topically 2 (two) times daily. 15 g 0  . pravastatin (PRAVACHOL) 20 MG tablet Take 1 tablet (20 mg total) by mouth daily. 90 tablet 3  . TRUEPLUS LANCETS 26G MISC 1 each by Does not apply route every 8 (eight) hours as needed. 100 each 12   Current Facility-Administered Medications on File Prior to Visit  Medication Dose Route Frequency Provider Last Rate Last Dose  . 0.9 %  sodium chloride infusion   Intravenous Continuous Ladell Pier, MD 150 mL/hr at 10/04/16 1440      No Known Allergies  Social History   Social History  . Marital status: Married    Spouse name: N/A  . Number of children: N/A  . Years of education: N/A  Occupational History  . Not on file.   Social History Main Topics  . Smoking status: Never Smoker  . Smokeless tobacco: Never Used  . Alcohol use No  . Drug use: Yes    Types: Marijuana  . Sexual activity: Yes    Birth control/ protection: Diaphragm   Other Topics Concern  . Not on file   Social History Narrative  . No narrative on file    Family History  Problem Relation Age of Onset  . Diabetes type II Mother   . Diabetes type II Father     Past Surgical History:  Procedure Laterality Date  . DRESSING CHANGE UNDER ANESTHESIA N/A 04/13/2016   Procedure: DRESSING CHANGE UNDER ANESTHESIA;  Surgeon: Johnathan Hausen, MD;  Location: [facility];  Service: General;  Laterality: N/A;  . HERNIA REPAIR     x2 when he was a baby    . INCISION AND DRAINAGE ABSCESS Right 09/03/2015   Procedure: INCISION AND DRAINAGE ABSCESS;  Surgeon: Roseanne Kaufman, MD;  Location: [facility];  Service: Orthopedics;  Laterality: Right;  . INCISION AND DRAINAGE PERIRECTAL ABSCESS N/A 04/11/2016   Procedure: IRRIGATION AND DEBRIDEMENT RECTAL ABSCESS;  Surgeon: Johnathan Hausen, MD;  Location: [facility];  Service: General;  Laterality: N/A;  . IRRIGATION AND DEBRIDEMENT ABSCESS Left 08/31/2015   Procedure: IRRIGATION AND DEBRIDEMENT ABSCESS LEFT SHOULDER;  Surgeon: Alphonsa Overall, MD;  Location: [facility];  Service: General;  Laterality: Left;  . WOUND DEBRIDEMENT N/A 02/24/2016   Procedure: SACRAL WOUND DEBRIDEMENT;  Surgeon: Georganna Skeans, MD;  Location: [place];  Service: General;  Laterality: N/A;    ROS: Review of Systems Neg as above  PHYSICAL EXAM: BP 137/88   Pulse 99   Temp 98.3 F (36.8 C) (Oral)   Resp 16   Wt 204 lb (92.5 kg)   SpO2 99%   BMI 31.95 kg/m   Physical Exam  General appearance - alert, well appearing, and in no distress Mental status -flat affect Mouth -slightly dry oral mucosa Chest - clear to auscultation, no wheezes, rales or rhonchi, symmetric air entry Heart - normal rate, regular rhythm, normal S1, S2, no murmurs, rubs, clicks or gallops Extremities -no LE edema Skin - LT big toe with mild erythema on dorsal surface; large ulcer down to fat layer on plantar surface of the toe. No puss expressed, can not probe to bone.  Small ulcers medially and laterally. DP and PT pulses 3+ Excoriated scabbed areas on lower legs      Results for orders placed or performed in visit on 10/29/16  POCT glucose (manual entry)  Result Value Ref Range   POC Glucose 576 (A) 70 - 99 mg/dl  POCT urinalysis dipstick  Result Value Ref Range   Color, UA yellow    Clarity, UA clear    Glucose, UA 500    Bilirubin, UA negative    Ketones, UA negative    Spec Grav, UA <=1.005 (A) 1.010 - 1.025   Blood, UA trace-intact    pH, UA 5.5  5.0 - 8.0   Protein, UA negative    Urobilinogen, UA 0.2 0.2 or 1.0 E.U./dL   Nitrite, UA positive    Leukocytes, UA Trace (A) Negative  POCT glucose (manual entry)  Result Value Ref Range   POC Glucose 507 (A) 70 - 99 mg/dl  POCT glucose (manual entry)  Result Value Ref Range   POC Glucose 419 (A) 70 - 99 mg/dl    Depression screen [facility] 2/9  10/29/2016  Decreased Interest 2  Down, Depressed, Hopeless 2  PHQ - 2 Score 4  Altered sleeping 2  Tired, decreased energy 2  Change in appetite 2  Feeling bad or failure about yourself  2  Trouble concentrating 2  Moving slowly or fidgety/restless 2  Suicidal thoughts 2  PHQ-9 Score 18    ASSESSMENT AND PLAN: 1. Uncontrolled type 2 diabetes mellitus with complication, with long-term current use of insulin ([place]) -Uncontrolled due to noncompliance with meds.  He understands the relationship b/w the neuropathy in feet/legs and poor control of his disease.  Used motivational interviewing to empower him  Given 25 units of Novolog and 1 L NS today in office - POCT glucose (manual entry) - insulin aspart (novoLOG) injection 20 Units; Inject 0.2 mLs (20 Units total) into the skin once. - POCT urinalysis dipstick - POCT glucose (manual entry) - Insulin Detemir (LEVEMIR FLEXPEN) 100 UNIT/ML Pen; Inject 20 Units into the skin 2 (two) times daily.  Dispense: 15 mL; Refill: 3 - insulin aspart (novoLOG) injection 5 Units; Inject 0.05 mLs (5 Units total) into the skin once. - Creatine - sodium chloride 0.9 % bolus 1,000 mL; Inject 1,000 mLs into the vein once. - POCT glucose (manual entry)  2. Diabetic polyneuropathy associated with type 2 diabetes mellitus ([facility]) -increase Gabpatentin to 300 mg TID - gabapentin (NEURONTIN) 300 MG capsule; Take 1 capsule (300 mg total) by mouth 3 (three) times daily.  Dispense: 90 capsule; Refill: 6  3. Diabetic ulcer of toe of left foot associated with type 2 diabetes mellitus, with fat layer exposed ([place]) -wound  cleaned up today with sterile water and clean gauze applied. Pt told to clean wound daily, dry, apply Gent cream then clean gauze.  Supplies given -refer to wound clinic -DM foot care discussed. - ciprofloxacin (CIPRO) 500 MG tablet; Take 1 tablet (500 mg total) by mouth 2 (two) times daily.  Dispense: 14 tablet; Refill: 0 - amoxicillin-clavulanate (AUGMENTIN) 875-125 MG tablet; Take 1 tablet by mouth 2 (two) times daily.  Dispense: 14 tablet; Refill: 0 - Sedimentation Rate - CBC With Differential - Ambulatory referral to Wound Clinic - gentamicin cream (GARAMYCIN) 0.1 %; Apply 1 application topically daily.  Dispense: 30 g; Refill: 0 - MR TOES LEFT W WO CONTRAST; Future  4. Depression, unspecified depression type -LCSW not available to see him today.  Will schedule appt.  Within next 1-2 wks  5. Acute cystitis without hematuria - ciprofloxacin (CIPRO) 500 MG tablet; Take 1 tablet (500 mg total) by mouth 2 (two) times daily.  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  Patient verbalized understanding of the plan and was able to repeat key elements of the plan.   Orders Placed This Encounter  Procedures  . MR TOES LEFT W WO CONTRAST  . Sedimentation Rate  . CBC With Differential  . Creatine  . Ambulatory referral to Wound Clinic  . POCT glucose (manual entry)  . POCT urinalysis dipstick  . POCT glucose (manual entry)  . POCT glucose (manual entry)     Requested Prescriptions   Signed Prescriptions Disp Refills  . ciprofloxacin (CIPRO) 500 MG tablet 14 tablet 0    Sig: Take 1 tablet (500 mg total) by mouth 2 (two) times daily.  Marland Kitchen amoxicillin-clavulanate (AUGMENTIN) 875-125 MG tablet 14 tablet 0    Sig: Take 1 tablet by  mouth 2 (two) times daily.  Marland Kitchen gentamicin cream (GARAMYCIN) 0.1 % 30 g 0    Sig: Apply 1 application topically daily.  . Insulin Detemir  (LEVEMIR FLEXPEN) 100 UNIT/ML Pen 15 mL 3    Sig: Inject 20 Units into the skin 2 (two) times daily.  Marland Kitchen gabapentin (NEURONTIN) 300 MG capsule 90 capsule 6    Sig: Take 1 capsule (300 mg total) by mouth 3 (three) times daily.    Return in about 2 weeks (around 11/12/2016).  Karle Plumber, MD, FACP

## 2016-10-30 ENCOUNTER — Ambulatory Visit: Payer: Self-pay

## 2016-10-30 LAB — GLUCOSE, POCT (MANUAL RESULT ENTRY): POC Glucose: 271 mg/dl — AB (ref 70–99)

## 2016-11-03 ENCOUNTER — Emergency Department (HOSPITAL_COMMUNITY): Payer: Self-pay

## 2016-11-03 ENCOUNTER — Inpatient Hospital Stay (HOSPITAL_COMMUNITY)
Admission: EM | Admit: 2016-11-03 | Discharge: 2016-11-08 | DRG: 617 | Disposition: A | Discharge status: Home / Self Care | Payer: Self-pay | Attending: Internal Medicine | Admitting: Internal Medicine

## 2016-11-03 DIAGNOSIS — N3000 Acute cystitis without hematuria: Secondary | ICD-10-CM | POA: Diagnosis present

## 2016-11-03 DIAGNOSIS — N179 Acute kidney failure, unspecified: Secondary | ICD-10-CM | POA: Diagnosis present

## 2016-11-03 DIAGNOSIS — Z833 Family history of diabetes mellitus: Secondary | ICD-10-CM

## 2016-11-03 DIAGNOSIS — L97529 Non-pressure chronic ulcer of other part of left foot with unspecified severity: Secondary | ICD-10-CM

## 2016-11-03 DIAGNOSIS — I10 Essential (primary) hypertension: Secondary | ICD-10-CM | POA: Diagnosis present

## 2016-11-03 DIAGNOSIS — F1290 Cannabis use, unspecified, uncomplicated: Secondary | ICD-10-CM | POA: Diagnosis present

## 2016-11-03 DIAGNOSIS — E785 Hyperlipidemia, unspecified: Secondary | ICD-10-CM | POA: Diagnosis present

## 2016-11-03 DIAGNOSIS — J45909 Unspecified asthma, uncomplicated: Secondary | ICD-10-CM | POA: Diagnosis present

## 2016-11-03 DIAGNOSIS — M868X7 Other osteomyelitis, ankle and foot: Secondary | ICD-10-CM

## 2016-11-03 DIAGNOSIS — R197 Diarrhea, unspecified: Secondary | ICD-10-CM | POA: Diagnosis present

## 2016-11-03 DIAGNOSIS — Z8614 Personal history of Methicillin resistant Staphylococcus aureus infection: Secondary | ICD-10-CM

## 2016-11-03 DIAGNOSIS — N390 Urinary tract infection, site not specified: Secondary | ICD-10-CM

## 2016-11-03 DIAGNOSIS — R45851 Suicidal ideations: Secondary | ICD-10-CM | POA: Diagnosis present

## 2016-11-03 DIAGNOSIS — I4892 Unspecified atrial flutter: Secondary | ICD-10-CM | POA: Diagnosis not present

## 2016-11-03 DIAGNOSIS — Z6832 Body mass index (BMI) 32.0-32.9, adult: Secondary | ICD-10-CM

## 2016-11-03 DIAGNOSIS — E1165 Type 2 diabetes mellitus with hyperglycemia: Secondary | ICD-10-CM | POA: Diagnosis present

## 2016-11-03 DIAGNOSIS — M86672 Other chronic osteomyelitis, left ankle and foot: Secondary | ICD-10-CM | POA: Diagnosis present

## 2016-11-03 DIAGNOSIS — F329 Major depressive disorder, single episode, unspecified: Secondary | ICD-10-CM | POA: Diagnosis present

## 2016-11-03 DIAGNOSIS — Z794 Long term (current) use of insulin: Secondary | ICD-10-CM

## 2016-11-03 DIAGNOSIS — E669 Obesity, unspecified: Secondary | ICD-10-CM | POA: Diagnosis present

## 2016-11-03 DIAGNOSIS — E1169 Type 2 diabetes mellitus with other specified complication: Principal | ICD-10-CM | POA: Diagnosis present

## 2016-11-03 DIAGNOSIS — M869 Osteomyelitis, unspecified: Secondary | ICD-10-CM | POA: Diagnosis present

## 2016-11-03 DIAGNOSIS — R45850 Homicidal ideations: Secondary | ICD-10-CM | POA: Diagnosis present

## 2016-11-03 DIAGNOSIS — L02612 Cutaneous abscess of left foot: Secondary | ICD-10-CM

## 2016-11-03 DIAGNOSIS — K529 Noninfective gastroenteritis and colitis, unspecified: Secondary | ICD-10-CM | POA: Diagnosis present

## 2016-11-03 HISTORY — DX: Bipolar disorder, unspecified: F31.9

## 2016-11-03 HISTORY — DX: Major depressive disorder, single episode, unspecified: F32.9

## 2016-11-03 HISTORY — DX: Type 2 diabetes mellitus without complications: E11.9

## 2016-11-03 HISTORY — DX: Unspecified osteoarthritis, unspecified site: M19.90

## 2016-11-03 HISTORY — DX: Cardiac murmur, unspecified: R01.1

## 2016-11-03 HISTORY — DX: Depression, unspecified: F32.A

## 2016-11-03 LAB — URINALYSIS, ROUTINE W REFLEX MICROSCOPIC
BILIRUBIN URINE: NEGATIVE
Glucose, UA: >=500 mg/dL — AB
Ketones, ur: 5 mg/dL — AB
Nitrite: POSITIVE — AB
PH: 6 (ref 5.0–8.0)
Protein, ur: 30 mg/dL — AB
SPECIFIC GRAVITY, URINE: 1.029 (ref 1.005–1.030)

## 2016-11-03 LAB — CBC WITH DIFFERENTIAL/PLATELET
BASOS ABS: 0 10*3/uL (ref 0.0–0.1)
Basophils Relative: 0 %
Eosinophils Absolute: 0 10*3/uL (ref 0.0–0.7)
Eosinophils Relative: 0 %
HEMATOCRIT: 33.3 % — AB (ref 39.0–52.0)
HEMOGLOBIN: 12 g/dL — AB (ref 13.0–17.0)
LYMPHS PCT: 6 %
Lymphs Abs: 1.5 10*3/uL (ref 0.7–4.0)
MCH: 30 pg (ref 26.0–34.0)
MCHC: 36 g/dL (ref 30.0–36.0)
MCV: 83.3 fL (ref 78.0–100.0)
MONOS PCT: 5 %
Monocytes Absolute: 1.3 10*3/uL — ABNORMAL HIGH (ref 0.1–1.0)
Neutro Abs: 22.5 10*3/uL — ABNORMAL HIGH (ref 1.7–7.7)
Neutrophils Relative %: 89 %
Platelets: 359 10*3/uL (ref 150–400)
RBC: 4 MIL/uL — AB (ref 4.22–5.81)
RDW: 12 % (ref 11.5–15.5)
WBC: 25.3 10*3/uL — AB (ref 4.0–10.5)

## 2016-11-03 LAB — COMPREHENSIVE METABOLIC PANEL
ALT: 14 U/L — AB (ref 17–63)
ANION GAP: 11 (ref 5–15)
AST: 16 U/L (ref 15–41)
Albumin: 3.1 g/dL — ABNORMAL LOW (ref 3.5–5.0)
Alkaline Phosphatase: 145 U/L — ABNORMAL HIGH (ref 38–126)
BUN: 9 mg/dL (ref 6–20)
CALCIUM: 9.4 mg/dL (ref 8.9–10.3)
CO2: 28 mmol/L (ref 22–32)
CREATININE: 1.05 mg/dL (ref 0.61–1.24)
Chloride: 94 mmol/L — ABNORMAL LOW (ref 101–111)
Glucose, Bld: 380 mg/dL — ABNORMAL HIGH (ref 65–99)
Potassium: 3.5 mmol/L (ref 3.5–5.1)
SODIUM: 133 mmol/L — AB (ref 135–145)
TOTAL PROTEIN: 7.7 g/dL (ref 6.5–8.1)
Total Bilirubin: 0.4 mg/dL (ref 0.3–1.2)

## 2016-11-03 LAB — CBG MONITORING, ED: GLUCOSE-CAPILLARY: 454 mg/dL — AB (ref 65–99)

## 2016-11-03 LAB — MRSA PCR SCREENING: MRSA by PCR: NEGATIVE

## 2016-11-03 LAB — I-STAT CG4 LACTIC ACID, ED: Lactic Acid, Venous: 1.74 mmol/L (ref 0.5–1.9)

## 2016-11-03 LAB — SEDIMENTATION RATE: SED RATE: 109 mm/h — AB (ref 0–16)

## 2016-11-03 LAB — GLUCOSE, CAPILLARY: Glucose-Capillary: 336 mg/dL — ABNORMAL HIGH (ref 65–99)

## 2016-11-03 LAB — PREALBUMIN: PREALBUMIN: 10.6 mg/dL — AB (ref 18–38)

## 2016-11-03 MED ORDER — VANCOMYCIN HCL IN DEXTROSE 1-5 GM/200ML-% IV SOLN
1000.0000 mg | Freq: Once | INTRAVENOUS | Status: AC
  Administered 2016-11-03: 1000 mg via INTRAVENOUS
  Filled 2016-11-03: qty 200

## 2016-11-03 MED ORDER — DEXTROSE 5 % IV SOLN
2.0000 g | INTRAVENOUS | Status: DC
  Administered 2016-11-03 – 2016-11-04 (×2): 2 g via INTRAVENOUS
  Filled 2016-11-03 (×2): qty 2

## 2016-11-03 MED ORDER — ACETAMINOPHEN 650 MG RE SUPP: 650.0000 mg | Freq: Four times a day (QID) | RECTAL | Status: DC | PRN

## 2016-11-03 MED ORDER — PIPERACILLIN-TAZOBACTAM 3.375 G IVPB 30 MIN
3.3750 g | Freq: Once | INTRAVENOUS | Status: AC
  Administered 2016-11-03: 3.375 g via INTRAVENOUS
  Filled 2016-11-03: qty 50

## 2016-11-03 MED ORDER — HYDROCODONE-ACETAMINOPHEN 5-325 MG PO TABS
2.0000 | ORAL_TABLET | Freq: Once | ORAL | Status: AC
  Administered 2016-11-03: 2 via ORAL
  Filled 2016-11-03: qty 2

## 2016-11-03 MED ORDER — GABAPENTIN 300 MG PO CAPS
300.0000 mg | ORAL_CAPSULE | Freq: Three times a day (TID) | ORAL | Status: DC
Start: 2016-11-03 — End: 2016-11-08
  Administered 2016-11-03 – 2016-11-08 (×13): 300 mg via ORAL
  Filled 2016-11-03 (×13): qty 1

## 2016-11-03 MED ORDER — SODIUM CHLORIDE 0.9% FLUSH: 3.0000 mL | INTRAVENOUS | Status: DC | PRN

## 2016-11-03 MED ORDER — ACETAMINOPHEN 325 MG PO TABS
650.0000 mg | ORAL_TABLET | Freq: Four times a day (QID) | ORAL | Status: DC | PRN
  Administered 2016-11-03 – 2016-11-05 (×5): 650 mg via ORAL
  Filled 2016-11-03 (×5): qty 2

## 2016-11-03 MED ORDER — VANCOMYCIN HCL IN DEXTROSE 750-5 MG/150ML-% IV SOLN
750.0000 mg | Freq: Three times a day (TID) | INTRAVENOUS | Status: DC
  Administered 2016-11-03 – 2016-11-05 (×5): 750 mg via INTRAVENOUS
  Filled 2016-11-03 (×6): qty 150

## 2016-11-03 MED ORDER — SODIUM CHLORIDE 0.9% FLUSH
3.0000 mL | Freq: Two times a day (BID) | INTRAVENOUS | Status: DC
  Administered 2016-11-03 – 2016-11-08 (×7): 3 mL via INTRAVENOUS

## 2016-11-03 MED ORDER — ENOXAPARIN SODIUM 40 MG/0.4ML ~~LOC~~ SOLN
40.0000 mg | Freq: Every day | SUBCUTANEOUS | Status: DC
  Administered 2016-11-04 – 2016-11-08 (×4): 40 mg via SUBCUTANEOUS
  Filled 2016-11-03 (×4): qty 0.4

## 2016-11-03 MED ORDER — SENNOSIDES-DOCUSATE SODIUM 8.6-50 MG PO TABS
2.0000 | ORAL_TABLET | Freq: Every day | ORAL | Status: DC
  Administered 2016-11-03 – 2016-11-04 (×2): 2 via ORAL
  Filled 2016-11-03 (×4): qty 2

## 2016-11-03 MED ORDER — ESCITALOPRAM OXALATE 20 MG PO TABS
20.0000 mg | ORAL_TABLET | Freq: Every day | ORAL | Status: DC
  Administered 2016-11-03 – 2016-11-07 (×5): 20 mg via ORAL
  Filled 2016-11-03 (×5): qty 1

## 2016-11-03 MED ORDER — PRAVASTATIN SODIUM 20 MG PO TABS
20.0000 mg | ORAL_TABLET | Freq: Every day | ORAL | Status: DC
  Administered 2016-11-04 – 2016-11-08 (×4): 20 mg via ORAL
  Filled 2016-11-03 (×4): qty 1

## 2016-11-03 MED ORDER — SODIUM CHLORIDE 0.9 % IV SOLN: 250.0000 mL | INTRAVENOUS | Status: DC | PRN

## 2016-11-03 MED ORDER — INSULIN ASPART 100 UNIT/ML ~~LOC~~ SOLN
4.0000 [IU] | Freq: Three times a day (TID) | SUBCUTANEOUS | Status: DC
  Administered 2016-11-04 – 2016-11-05 (×4): 4 [IU] via SUBCUTANEOUS

## 2016-11-03 MED ORDER — OXYCODONE HCL 5 MG PO TABS
5.0000 mg | ORAL_TABLET | ORAL | Status: DC | PRN
  Administered 2016-11-03: 5 mg via ORAL
  Filled 2016-11-03 (×4): qty 1

## 2016-11-03 MED ORDER — ONDANSETRON HCL 4 MG PO TABS: 4.0000 mg | ORAL_TABLET | Freq: Four times a day (QID) | ORAL | Status: DC | PRN

## 2016-11-03 MED ORDER — ONDANSETRON HCL 4 MG/2ML IJ SOLN: 4.0000 mg | Freq: Four times a day (QID) | INTRAMUSCULAR | Status: DC | PRN

## 2016-11-03 MED ORDER — INSULIN ASPART 100 UNIT/ML ~~LOC~~ SOLN
0.0000 [IU] | Freq: Three times a day (TID) | SUBCUTANEOUS | Status: DC
  Administered 2016-11-04: 12 [IU] via SUBCUTANEOUS

## 2016-11-03 MED ORDER — METRONIDAZOLE 500 MG PO TABS
500.0000 mg | ORAL_TABLET | Freq: Three times a day (TID) | ORAL | Status: DC
  Administered 2016-11-03 – 2016-11-05 (×5): 500 mg via ORAL
  Filled 2016-11-03 (×5): qty 1

## 2016-11-03 MED ORDER — INSULIN DETEMIR 100 UNIT/ML ~~LOC~~ SOLN
15.0000 [IU] | Freq: Two times a day (BID) | SUBCUTANEOUS | Status: DC
  Administered 2016-11-03 – 2016-11-04 (×2): 15 [IU] via SUBCUTANEOUS
  Filled 2016-11-03 (×2): qty 0.15

## 2016-11-03 NOTE — ED Provider Notes (Signed)
[facility] DEPT Provider Note   CSN: [id number] Arrival date & time: 11/03/16  1141     History   Chief Complaint Chief Complaint  Patient presents with  . Foot Pain  . Hyperglycemia    HPI [name] [name] is a 31 y.o. male.  HPI  31 year old male with a history of type 2 diabetespresents with worsening left foot pain. He's been having a left great toe wound with some drainage for the last 3 weeks. Over last couple days the pain has significant worsened. There is now redness over his medial foot that is new over the last day or so. Most recently had blood and pustular drainage from the great toe wound. Now has a small wound at the proximal aspect of his plantar toe. Denies any fevers or chills. Has not taken anything for the pain. He is not currently on antibiotics and was told by his PCP to perform good wound care.  Past Medical History:  Diagnosis Date  . Asthma   . Diabetes mellitus without complication ([place])   . Hypertension     Patient Active Problem List   Diagnosis Date Noted  . Osteomyelitis ([place]) 11/03/2016  . Type 2 diabetes mellitus with diabetic neuropathy, unspecified ([place]) 10/27/2016  . Hyperlipidemia 10/17/2016  . Depression 10/17/2016  . Neck pain   . Asthma 04/11/2016  . Uncontrolled type 2 diabetes mellitus with complication ([place])   . Obesity, morbid ([place]) 09/06/2015  . Hypertension 09/06/2015    Past Surgical History:  Procedure Laterality Date  . DRESSING CHANGE UNDER ANESTHESIA N/A 04/13/2016   Procedure: DRESSING CHANGE UNDER ANESTHESIA;  Surgeon: Johnathan Hausen, MD;  Location: [facility];  Service: General;  Laterality: N/A;  . HERNIA REPAIR     x2 when he was a baby  . INCISION AND DRAINAGE ABSCESS Right 09/03/2015   Procedure: INCISION AND DRAINAGE ABSCESS;  Surgeon: Roseanne Kaufman, MD;  Location: [facility];  Service: Orthopedics;  Laterality: Right;  . INCISION AND DRAINAGE PERIRECTAL ABSCESS N/A 04/11/2016   Procedure: IRRIGATION AND DEBRIDEMENT  RECTAL ABSCESS;  Surgeon: Johnathan Hausen, MD;  Location: [facility];  Service: General;  Laterality: N/A;  . IRRIGATION AND DEBRIDEMENT ABSCESS Left 08/31/2015   Procedure: IRRIGATION AND DEBRIDEMENT ABSCESS LEFT SHOULDER;  Surgeon: Alphonsa Overall, MD;  Location: [facility];  Service: General;  Laterality: Left;  . WOUND DEBRIDEMENT N/A 02/24/2016   Procedure: SACRAL WOUND DEBRIDEMENT;  Surgeon: Georganna Skeans, MD;  Location: [facility];  Service: General;  Laterality: N/A;       Home Medications    Prior to Admission medications   Medication Sig Start Date End Date Taking? Authorizing Provider  acetaminophen (TYLENOL) 325 MG tablet Take 2 tablets (650 mg total) by mouth every 6 (six) hours as needed for mild pain or fever (for pain). 04/17/16   Regalado, Belkys A, MD  amoxicillin-clavulanate (AUGMENTIN) 875-125 MG tablet Take 1 tablet by mouth 2 (two) times daily. 10/29/16   Ladell Pier, MD  Blood Glucose Monitoring Suppl (TRUE METRIX GO GLUCOSE METER) w/Device KIT 1 each by Does not apply route every 8 (eight) hours as needed. 04/10/16   Maren Reamer, MD  ciprofloxacin (CIPRO) 500 MG tablet Take 1 tablet (500 mg total) by mouth 2 (two) times daily. 10/29/16   Ladell Pier, MD  escitalopram (LEXAPRO) 20 MG tablet Take 1 tablet (20 mg total) by mouth at bedtime. 10/04/16   Ladell Pier, MD  gabapentin (NEURONTIN) 300 MG capsule Take 1 capsule (300 mg  total) by mouth 3 (three) times daily. 10/29/16   Ladell Pier, MD  gentamicin cream (GARAMYCIN) 0.1 % Apply 1 application topically daily. 10/29/16   Ladell Pier, MD  glucose blood test strip Use as instructed 04/10/16   Lottie Mussel T, MD  insulin aspart (NOVOLOG FLEXPEN) 100 UNIT/ML FlexPen Inject 20 units 15 minutes before your two largest meals of the day Patient not taking: Reported on 08/24/2016 08/08/16   Lottie Mussel T, MD  Insulin Detemir (LEVEMIR FLEXPEN) 100 UNIT/ML Pen Inject 20 Units into the skin 2 (two) times daily.  10/29/16   Ladell Pier, MD  Insulin Pen Needle Flossie Buffy MINI PEN NEEDLES) 31G X 6 MM MISC Use as directed 10/04/16   Ladell Pier, MD  metFORMIN (GLUCOPHAGE XR) 500 MG 24 hr tablet Take 1 tablet (500 mg total) by mouth 2 (two) times daily. 10/04/16   Ladell Pier, MD  mupirocin cream (BACTROBAN) 2 % Apply 1 application topically 2 (two) times daily. 10/04/16   Ladell Pier, MD  pravastatin (PRAVACHOL) 20 MG tablet Take 1 tablet (20 mg total) by mouth daily. 10/04/16   Ladell Pier, MD  TRUEPLUS LANCETS 26G MISC 1 each by Does not apply route every 8 (eight) hours as needed. 04/10/16   Maren Reamer, MD    Family History Family History  Problem Relation Age of Onset  . Diabetes type II Mother   . Diabetes type II Father     Social History Social History  Substance Use Topics  . Smoking status: Never Smoker  . Smokeless tobacco: Never Used  . Alcohol use No     Allergies   Patient has no known allergies.   Review of Systems Review of Systems  Constitutional: Negative for fever.  Musculoskeletal: Positive for arthralgias.  Skin: Positive for color change and wound.  All other systems reviewed and are negative.    Physical Exam Updated Vital Signs BP 128/83 (BP Location: Left Arm)   Pulse (!) 101   Temp 99.2 F (37.3 C) (Oral)   Resp 16   Ht '5\' 7"'  (1.702 m)   Wt 93 kg (205 lb)   SpO2 99%   BMI 32.11 kg/m   Physical Exam  Constitutional: He is oriented to person, place, and time. He appears well-developed and well-nourished.  HENT:  Head: Normocephalic and atraumatic.  Right Ear: External ear normal.  Left Ear: External ear normal.  Nose: Nose normal.  Eyes: Right eye exhibits no discharge. Left eye exhibits no discharge.  Neck: Neck supple.  Cardiovascular: Normal rate, regular rhythm and normal heart sounds.   Pulmonary/Chest: Effort normal and breath sounds normal.  Musculoskeletal: He exhibits no edema.       Feet:  Feet:    Left Foot:  Skin Integrity: Positive for ulcer, skin breakdown, erythema and warmth.  Neurological: He is alert and oriented to person, place, and time.  Skin: Skin is warm and dry.  Nursing note and vitals reviewed.    ED Treatments / Results  Labs (all labs ordered are listed, but only abnormal results are displayed) Labs Reviewed  COMPREHENSIVE METABOLIC PANEL - Abnormal; Notable for the following:       Result Value   Sodium 133 (*)    Chloride 94 (*)    Glucose, Bld 380 (*)    Albumin 3.1 (*)    ALT 14 (*)    Alkaline Phosphatase 145 (*)    All other components within normal limits  CBC WITH DIFFERENTIAL/PLATELET - Abnormal; Notable for the following:    WBC 25.3 (*)    RBC 4.00 (*)    Hemoglobin 12.0 (*)    HCT 33.3 (*)    Neutro Abs 22.5 (*)    Monocytes Absolute 1.3 (*)    All other components within normal limits  SEDIMENTATION RATE - Abnormal; Notable for the following:    Sed Rate 109 (*)    All other components within normal limits  URINALYSIS, ROUTINE W REFLEX MICROSCOPIC - Abnormal; Notable for the following:    APPearance CLOUDY (*)    Glucose, UA >=500 (*)    Hgb urine dipstick SMALL (*)    Ketones, ur 5 (*)    Protein, ur 30 (*)    Nitrite POSITIVE (*)    Leukocytes, UA LARGE (*)    Bacteria, UA RARE (*)    Squamous Epithelial / LPF 0-5 (*)    Non Squamous Epithelial 0-5 (*)    All other components within normal limits  CBG MONITORING, ED - Abnormal; Notable for the following:    Glucose-Capillary 454 (*)    All other components within normal limits  URINE CULTURE  HEMOGLOBIN A1C  HIV ANTIBODY (ROUTINE TESTING)  BASIC METABOLIC PANEL  CBC  PREALBUMIN  I-STAT CG4 LACTIC ACID, ED    EKG  EKG Interpretation None       Radiology Dg Foot Complete Left  Result Date: 11/03/2016 CLINICAL DATA:  Diabetic ulcer over the left great toe. EXAM: LEFT FOOT - COMPLETE 3+ VIEW COMPARISON:  None. FINDINGS: The distal phalanx of the great toe is  deformed and mottled in appearance. There appears to be gas in the adjacent soft tissues. Bony fragments are displaced based on the lateral view. The remainder of the great toe is intact. Remainder of the bones are normal. IMPRESSION: Soft tissue gas in the great toe distally with underlying bony erosion consistent with osteomyelitis. Displacement of bony fragments raises the possibility of superimposed fracture. Recommend clinical correlation. Electronically Signed   By: Dorise Bullion III M.D   On: 11/03/2016 13:07    Procedures Procedures (including critical care time)  Medications Ordered in ED Medications  gabapentin (NEURONTIN) capsule 300 mg (not administered)  Insulin Detemir (LEVEMIR) FlexPen 15 Units (not administered)  escitalopram (LEXAPRO) tablet 20 mg (not administered)  pravastatin (PRAVACHOL) tablet 20 mg (not administered)  enoxaparin (LOVENOX) injection 40 mg (not administered)  sodium chloride flush (NS) 0.9 % injection 3 mL (not administered)  sodium chloride flush (NS) 0.9 % injection 3 mL (not administered)  0.9 %  sodium chloride infusion (not administered)  acetaminophen (TYLENOL) tablet 650 mg (not administered)    Or  acetaminophen (TYLENOL) suppository 650 mg (not administered)  oxyCODONE (Oxy IR/ROXICODONE) immediate release tablet 5 mg (not administered)  senna-docusate (Senokot-S) tablet 2 tablet (not administered)  ondansetron (ZOFRAN) tablet 4 mg (not administered)    Or  ondansetron (ZOFRAN) injection 4 mg (not administered)  insulin aspart (novoLOG) injection 0-15 Units (not administered)  insulin aspart (novoLOG) injection 4 Units (not administered)  cefTRIAXone (ROCEPHIN) 2 g in dextrose 5 % 50 mL IVPB (not administered)    And  metroNIDAZOLE (FLAGYL) tablet 500 mg (not administered)  HYDROcodone-acetaminophen (NORCO/VICODIN) 5-325 MG per tablet 2 tablet (2 tablets Oral Given 11/03/16 1246)  piperacillin-tazobactam (ZOSYN) IVPB 3.375 g (0 g  Intravenous Stopped 11/03/16 1425)  vancomycin (VANCOCIN) IVPB 1000 mg/200 mL premix (0 mg Intravenous Stopped 11/03/16 1535)     Initial Impression /  Assessment and Plan / ED Course  I have reviewed the triage vital signs and the nursing notes.  Pertinent labs & imaging results that were available during my care of the patient were reviewed by me and considered in my medical decision making (see chart for details).     Patient's xray is c/w osteomyelitis/acute infection. Chart review shows he is supposed to be on augmentin/cipro, unclear if actually taking. However, given new findings, will need IV antibiotics and admission. Teaching service to admit. Hemodynamically stable.   Final Clinical Impressions(s) / ED Diagnoses   Final diagnoses:  Diabetic osteomyelitis [facility])    New Prescriptions Current Discharge Medication List       Sherwood Gambler, MD 11/03/16 [phone]

## 2016-11-03 NOTE — ED Notes (Signed)
Patient transported to X-ray 

## 2016-11-03 NOTE — Progress Notes (Signed)
[name] [name] is a 31 y.o. male patient admitted from ED awake, alert - oriented  X 4 - no acute distress noted.  VSS - Blood pressure 128/83, pulse (!) 101, temperature 99.2 F (37.3 C), temperature source Oral, resp. rate 16, height 5\' 7"  (1.702 m), weight 93 kg (205 lb), SpO2 99 %.    IV in place, occlusive dsg intact without redness.  Orientation to room, and floor completed with information packet given to patient/family.  Patient declined safety video at this time.  Admission INP armband ID verified with patient/family, and in place.   SR up x 2, fall assessment complete, with patient and family able to verbalize understanding of risk associated with falls, and verbalized understanding to call nsg before up out of bed.  Call light within reach, patient able to voice, and demonstrate understanding.  Skin, clean-dry- intact without evidence of bruising, or skin tea No evidence of skin break down noted on exam. Pt has generalize old bruising and left great toe would that is infected.     Will cont to eval and treat per MD orders.  Dorris Carnes, RN 11/03/2016 6:34 PM

## 2016-11-03 NOTE — H&P (Signed)
Date: 11/03/2016               Patient Name:  [name] [name] MRN: [id number]  DOB: [birth year] Age / Sex: 31 y.o., male   PCP: Ladell Pier, MD         Medical Service: Internal Medicine Teaching Service         Attending Physician: Dr. Oval Linsey, MD    First Contact: Dr. Ronalee Red Pager: [phone]  Second Contact: Dr. Charlynn Grimes Pager: [phone]       After Hours (After 5p/  First Contact Pager: [phone]  weekends / holidays): Second Contact Pager: [phone]   Chief Complaint: left foot pain  History of Present Illness: [name] [name] is a 31yo male with PMH significant for diabetes (last HbA1c 07/2016 was 11.8), HTN, HLD, and depression who presents with worsening left foot pain and great toe wound for the last 3 weeks. The wound started as a blister and has progressed to a draining ulcer. He notes increasing pain over his medial foot and had blood and pustular drainage this morning while cleaning the wound, so his friend called EMS. He denies fevers or chills. Has not taken anything for the pain. He was just seen by his PCP on 8/13, who prescribed antibiotics - augmentin and cipro - however, the patient states he has not been taking them because he could not get to the pharmacy.  In the ER, vitals were 119/73, HR 98, temp 99.1, RR 20, O2 sats 100%. Lactic acid 1.74 (nl), ESR 109, WBC 25.3. UA positive for infection and with >/= 500 glucose. Foot xray with soft tissue gas in the great toe distally with underlying bony erosion, consistent with osteomyelitis. Displacement of bony fragments raises possibility of superimposed fracture.  He endorses decreased appetite, increased sleepiness, and has recently been stressed 2/2 issues with his wife. He endorses suicidal and homicidal ideation but states "I'm not stupid enough to do it".  He was diagnosed with diabetes when he was 52. He states he could not afford any of his diabetes medications for 10 years and could only recently start taking his  diabetes medications. He reports that he is usually pretty good at taking his medications but recently has had a difficult time. PCP note states he does not take his insulin as prescribed and does not check BGs. He endorses polydipsia and polyuria, as well as numbness in his bilateral lower extremities. Denies dysuria or hematuria.  Meds:  Current Facility-Administered Medications for the 11/03/16 encounter [facility] Encounter)  Medication  . 0.9 %  sodium chloride infusion   No outpatient prescriptions have been marked as taking for the 11/03/16 encounter [facility] Encounter).   Allergies: Allergies as of 11/03/2016  . (No Known Allergies)   Past Medical History:  Diagnosis Date  . Asthma   . Diabetes mellitus without complication ([place])   . Hypertension    Family History:  Family History  Problem Relation Age of Onset  . Diabetes type II Mother   . Diabetes type II Father    Social History:  - denies smoking, endorses occasional alcohol use - uses marijuana 1x/week for stress  Review of Systems: A complete ROS was negative except as per HPI.  Physical Exam: Blood pressure 119/73, pulse 98, temperature 99.1 F (37.3 C), temperature source Oral, resp. rate 20, height _0  (1.702 m), weight 205 lb (93 kg), SpO2 100 %.  GEN: Well-appearing male lying in bed comfortably in NAD RESP: Clear to auscultation bilaterally.  No wheezes, rales, or rhonchi. CV: Normal rate and regular rhythm. No murmurs, gallops, or rubs. No LE edema. ABD: Soft. Non-tender. Non-distended. Soft bowel sounds. EXT: No edema. Could not palpate DP or PT pulses in RLE. Bounding DP pulse in LLE. 3cm black necrotic-looking ulcer on bottom of L great toe without drainage; erythema of L great toe and medial foot around the arch. No increased warmth. Tender in medial foot, non-tender at the toe NEURO: Cranial nerves II-XII grossly intact. Able to lift all four extremities against gravity. PSYCH: Patient is calm and  tearful.  Labs CBC Latest Ref Rng & Units 11/03/2016 08/24/2016 08/08/2016  WBC 4.0 - 10.5 K/uL 25.3(H) 10.1 8.4  Hemoglobin 13.0 - 17.0 g/dL 12.0(L) 14.0 13.4  Hematocrit 39.0 - 52.0 % 33.3(L) 39.0 40.4  Platelets 150 - 400 K/uL 359 219 229   CMP Latest Ref Rng & Units 11/03/2016 10/04/2016 08/24/2016  Glucose 65 - 99 mg/dL 380(H) 375(H) 347(H)  BUN 6 - 20 mg/dL _0 Creatinine 0.61 - 1.24 mg/dL 1.05 0.76 1.13  Sodium 135 - 145 mmol/L 133(L) 136 131(L)  Potassium 3.5 - 5.1 mmol/L 3.5 4.2 4.0  Chloride 101 - 111 mmol/L 94(L) 97 94(L)  CO2 22 - 32 mmol/L _1 Calcium 8.9 - 10.3 mg/dL 9.4 8.9 9.4  Total Protein 6.5 - 8.1 g/dL 7.7 - -  Total Bilirubin 0.3 - 1.2 mg/dL 0.4 - -  Alkaline Phos 38 - 126 U/L 145(H) - -  AST 15 - 41 U/L 16 - -  ALT 17 - 63 U/L 14(L) - -   ESR 109 Lactic acid 1.74 UA with >/= 500 glucose, small Hgb, 5 ketones, 30 protein, positive nitrite, large leukocytes, 0-5 RBC, too numerous to count WBC, rare bacteria, WBC clumps present  Left foot xray 8/18 Soft tissue gas in the great toe distally with underlying bony erosion consistent with osteomyelitis. Displacement of bony fragments raises the possibility of superimposed fracture. Recommend clinical correlation.  Assessment & Plan by Problem: Active Problems:   Osteomyelitis [facility])  Mr. Coate is a 31yo male with PMH significant for poorly-controlled diabetes (last HbA1c 07/2016 was 11.8), HTN, HLD, and depression who presents with worsening left foot pain and great toe wound for the last 3 weeks with imaging concerning for osteomyelitis.  # Osteomyelitis of L great toe, with overlying cellulitis of L medial foot Patient has had an untreated worsening wound for 3 weeks. He saw his PCP 5 days ago, who prescribed antibiotics, but he has not been able to take these because he could not get to the pharmacy. Based on the picture that is provided in her note from 8/13, the wound appears more necrotic and worse today.  Foot xray confirms concern for osteomyelitis, and he also has an elevated ESR. Patient has also had increasing pain in the medial foot recently. The medial foot and toe are erythematous, but not warm to the touch. This likely represents an overlying cellulitis. We have spoken to Ortho. They are aware of the patient and will see him tomorrow with likely plan for OR either Monday 8/20 or Tuesday 8/21. - MRI for further evaluation of osteomyelitis - Ortho consulted - they will see him tomorrow with probable plan for OR either 8/20 or 8/21 - Vancomycin for history of MRSA + ceftriaxone + metronidazole for overlying cellulitis - pain control: tylenol PRN + oxy 67m PRN - CBC & BMET tomorrow AM - ABI  # Poorly controlled diabetes  BG 454 on admission. Patient states he was diagnosed with diabetes in his 35s when he had polyuria and polydipsia. His chart states that he has type 2 diabetes, however given the age of his presentation, I wonder if he may have type 1 diabetes instead. Patient's last A1c was 11.8. Prescribed metformin, levemir 20 BID & novolog 20 BID. - HbA1c - hold home metformin - Levemir 15u BID + novolog with meals + SSI - CBG monitoring  # Acute cystitis UA positive for infection. He currently denies dysuria, decreased volume or urinary urgency. Patient was prescribed cipro for a positive UA and dysuria by his PCP. I am not sure if he was taking this medication. - On antibiotics for cellulitis, as above - f/u UCx  # Depression Eexpresses suicidal and homicidal ideation but denies intent. Working through increased stress at home. Endorses increased sleepiness and decreased appetite - Continue home escitalopram 39m  # HLD - continue home pravastatin 267m # FEN/GI - Diabetic diet  VTE PPx: Lovenox  Dispo: Admit patient to Inpatient with expected length of stay greater than 2 midnights.  Signed: HuColbert EwingMD  Internal Medicine, PGY-1 11/03/2016, 3:45 PM  P  [phone]

## 2016-11-03 NOTE — ED Triage Notes (Signed)
Pt BIB EMS from home for foot pain and hyperglycemia. Pt has diabetic ulcer to left great toe and is seen by his PCP and wound care for it; however, states pain is new. Pt CBG 527 PTA, states he just took his insulin after breakfast. Pt denies n/v or SOB. Resp e/u; NAD at this time.

## 2016-11-03 NOTE — Progress Notes (Signed)
Report received from [facility] in the ED. Pt arrived to unit at [place] via wheel chair.

## 2016-11-03 NOTE — Progress Notes (Signed)
Pharmacy Antibiotic Note [name] [name] is a 31 y.o. male admitted on 11/03/2016 with concern for osteomyelitis of left great toe. Pharmacy has been consulted for vancomycin dosing.  Plan: 1. Vancomycin 750 IV every 8 hours.  Goal trough 15-20 mcg/mL.  2. Ceftriaxone 2 gram IV every 24 hours. 3. Flagyl 500 mg IV every 8 hours.   Height: 5\' 7"  (170.2 cm) Weight: 205 lb (93 kg) IBW/kg (Calculated) : 66.1  Temp (24hrs), Avg:99.2 F (37.3 C), Min:99.1 F (37.3 C), Max:99.2 F (37.3 C)   Recent Labs Lab 11/03/16 1233 11/03/16 1257  WBC 25.3*  --   CREATININE 1.05  --   LATICACIDVEN  --  1.74    Estimated Creatinine Clearance: 111.9 mL/min (by C-G formula based on SCr of 1.05 mg/dL).    No Known Allergies   Thank you for allowing pharmacy to be a part of this patient's care.Marland Kitchenandyu  Vincenza Hews, PharmD, BCPS 11/03/2016, 6:59 PM

## 2016-11-04 ENCOUNTER — Inpatient Hospital Stay (HOSPITAL_COMMUNITY): Payer: Self-pay

## 2016-11-04 DIAGNOSIS — E785 Hyperlipidemia, unspecified: Secondary | ICD-10-CM

## 2016-11-04 DIAGNOSIS — E1169 Type 2 diabetes mellitus with other specified complication: Principal | ICD-10-CM

## 2016-11-04 DIAGNOSIS — L02612 Cutaneous abscess of left foot: Secondary | ICD-10-CM

## 2016-11-04 DIAGNOSIS — M869 Osteomyelitis, unspecified: Secondary | ICD-10-CM

## 2016-11-04 LAB — BASIC METABOLIC PANEL
ANION GAP: 12 (ref 5–15)
BUN: 7 mg/dL (ref 6–20)
CHLORIDE: 95 mmol/L — AB (ref 101–111)
CO2: 25 mmol/L (ref 22–32)
Calcium: 8.7 mg/dL — ABNORMAL LOW (ref 8.9–10.3)
Creatinine, Ser: 0.82 mg/dL (ref 0.61–1.24)
GFR calc Af Amer: >60 mL/min (ref >60)
Glucose, Bld: 295 mg/dL — ABNORMAL HIGH (ref 65–99)
POTASSIUM: 3.5 mmol/L (ref 3.5–5.1)
SODIUM: 132 mmol/L — AB (ref 135–145)

## 2016-11-04 LAB — HEMOGLOBIN A1C
Hgb A1c MFr Bld: 11 % — ABNORMAL HIGH (ref 4.8–5.6)
Mean Plasma Glucose: 269 mg/dL

## 2016-11-04 LAB — CBC
HEMATOCRIT: 30.4 % — AB (ref 39.0–52.0)
HEMOGLOBIN: 10.6 g/dL — AB (ref 13.0–17.0)
MCH: 29.1 pg (ref 26.0–34.0)
MCHC: 34.9 g/dL (ref 30.0–36.0)
MCV: 83.5 fL (ref 78.0–100.0)
Platelets: 316 10*3/uL (ref 150–400)
RBC: 3.64 MIL/uL — AB (ref 4.22–5.81)
RDW: 12 % (ref 11.5–15.5)
WBC: 24 10*3/uL — AB (ref 4.0–10.5)

## 2016-11-04 LAB — HIV ANTIBODY (ROUTINE TESTING W REFLEX): HIV Screen 4th Generation wRfx: NONREACTIVE

## 2016-11-04 LAB — GLUCOSE, CAPILLARY
GLUCOSE-CAPILLARY: 284 mg/dL — AB (ref 65–99)
GLUCOSE-CAPILLARY: 337 mg/dL — AB (ref 65–99)
GLUCOSE-CAPILLARY: 201 mg/dL — AB (ref 65–99)
Glucose-Capillary: 275 mg/dL — ABNORMAL HIGH (ref 65–99)

## 2016-11-04 MED ORDER — INSULIN ASPART 100 UNIT/ML ~~LOC~~ SOLN
0.0000 [IU] | Freq: Three times a day (TID) | SUBCUTANEOUS | Status: DC
  Administered 2016-11-04: 11 [IU] via SUBCUTANEOUS
  Administered 2016-11-04: 15 [IU] via SUBCUTANEOUS
  Administered 2016-11-05: 20 [IU] via SUBCUTANEOUS
  Administered 2016-11-06: 11 [IU] via SUBCUTANEOUS
  Administered 2016-11-06: 7 [IU] via SUBCUTANEOUS
  Administered 2016-11-06: 11 [IU] via SUBCUTANEOUS
  Administered 2016-11-07: 20 [IU] via SUBCUTANEOUS
  Administered 2016-11-07: 15 [IU] via SUBCUTANEOUS
  Administered 2016-11-07: 4 [IU] via SUBCUTANEOUS
  Administered 2016-11-08 (×2): 11 [IU] via SUBCUTANEOUS
  Administered 2016-11-08: 1 [IU] via SUBCUTANEOUS

## 2016-11-04 MED ORDER — INSULIN DETEMIR 100 UNIT/ML ~~LOC~~ SOLN
20.0000 [IU] | Freq: Two times a day (BID) | SUBCUTANEOUS | Status: DC
  Administered 2016-11-04 – 2016-11-05 (×2): 20 [IU] via SUBCUTANEOUS
  Filled 2016-11-04 (×2): qty 0.2

## 2016-11-04 NOTE — Progress Notes (Signed)
VASCULAR LAB PRELIMINARY  ARTERIAL  ABI completed:ABIs and toe pressures within normal limits.    RIGHT    LEFT    PRESSURE WAVEFORM  PRESSURE WAVEFORM  BRACHIAL 121 T BRACHIAL 116 T  DP   DP    AT 138 T AT 148 T  PT 146 T PT 156 T  PER   PER    GREAT TOE 135 NA 3rd TOE 136 NA    RIGHT LEFT  ABI 1.21 1.29     [name] [name], RVT 11/04/2016, 10:36 AM

## 2016-11-04 NOTE — Progress Notes (Signed)
Subjective:  [name] [name] reports the pain in his toe is improved today. He did spike a fever to 102.9 overnight. He does feel warm, particularly at night, which he states happens often at home. Dr. Sharol Given planning for amputation Wednesday.  Objective:  Vital signs in last 24 hours: Vitals:   11/03/16 2122 11/03/16 2122 11/03/16 2347 11/04/16 0601  BP: 140/74 140/74  129/82  Pulse: (!) 120 (!) 120  (!) 106  Resp: 18 18  18   Temp: (!) 102.9 F (39.4 C) (!) 102.9 F (39.4 C) 99.1 F (37.3 C) (!) 101 F (38.3 C)  TempSrc: Oral Oral Oral Oral  SpO2: 96% 96%  97%  Weight:      Height:       GEN: Lying in bed comfortably in NAD. Alert and oriented. RESP: Clear to auscultation bilaterally. No wheezes. CV: Normal rate and regular rhythm. No murmurs, gallops, or rubs. No LE edema. EXT: No edema. Could not palpate DP or PT pulses in RLE. 2+ DP pulse in LLE. Black necrotic-looking ulcer on bottom of L great toe without drainage. Worse appearance than yesterday. Toenail fell off. Erythema of L great toe and medial foot around the arch. Some increased warmth in medial foot.  Labs CBC Latest Ref Rng & Units 11/04/2016 11/03/2016 08/24/2016  WBC 4.0 - 10.5 K/uL 24.0(H) 25.3(H) 10.1  Hemoglobin 13.0 - 17.0 g/dL 10.6(L) 12.0(L) 14.0  Hematocrit 39.0 - 52.0 % 30.4(L) 33.3(L) 39.0  Platelets 150 - 400 K/uL 316 359 219   BMP Latest Ref Rng & Units 11/04/2016 11/03/2016 10/04/2016  Glucose 65 - 99 mg/dL 295(H) 380(H) 375(H)  BUN 6 - 20 mg/dL 7 9 13   Creatinine 0.61 - 1.24 mg/dL 0.82 1.05 0.76  BUN/Creat Ratio 9 - 20 - - 17  Sodium 135 - 145 mmol/L 132(L) 133(L) 136  Potassium 3.5 - 5.1 mmol/L 3.5 3.5 4.2  Chloride 101 - 111 mmol/L 95(L) 94(L) 97  CO2 22 - 32 mmol/L 25 28 23   Calcium 8.9 - 10.3 mg/dL 8.7(L) 9.4 8.9   A1c 11.0  Assessment/Plan:  Active Problems:   Osteomyelitis [facility])  [name] [name] is a 31yo male with PMH significant for poorly-controlled diabetes (last HbA1c 07/2016 was 11.8),  HTN, HLD, and depression who presents with worsening left foot pain and great toe wound for the last 3 weeks with imaging concerning for osteomyelitis.  # Osteomyelitis of L great toe, with overlying cellulitis of L medial foot Foot xray confirms concern for osteomyelitis. He is now febrile to 102.9. The medial foot and toe are erythematous, painful, and warm to the touch. This likely represents an overlying cellulitis. ABIs normal. Ortho plans for surgery Wednesday. They do not need the MRI. - Ortho consulted, appreciate their assistance --- Plan for OR Wednesday - Vancomycin for history of MRSA + ceftriaxone + metronidazole for overlying cellulitis - pain control: tylenol PRN + oxy 5mg  PRN  # Poorly controlled diabetes Patient likely has type 2 diabetes. He has informed us that he was diagnosed 10 years ago and had not been treated until just recently. If he were a type 1 diabetic not receiving insulin for 10 years, I would expect him to have acutely decompensated at some point during that time period. The fact that he did not suggests that he is likely type 2. He also is on 40u of long-acting insulin and 40u of short-acting insulin at home, which suggests that he does have some component of insulin resistance, further confirming likely  type 2. BG in the high 200s. A1c 11.0 Prescribed metformin, levemir 20 BID & novolog 20 BID at home - hold home metformin - Levemir 20u BID + 4u novolog with meals + SSI (resistant) - CBG monitoring  # Acute cystitis UA positive for infection. He currently denies dysuria, decreased volume or urinary urgency. Patient was prescribed cipro for a positive UA and dysuria by his PCP. I am not sure if he was taking this medication. - On antibiotics for cellulitis, as above - f/u UCx  # Depression Eexpresses suicidal and homicidal ideation but denies intent. Working through increased stress at home. Endorses increased sleepiness and decreased appetite - Continue  home escitalopram 20mg   # HLD - continue home pravastatin 20mg   # FEN/GI - Diabetic diet  VTE PPx: Lovenox  Dispo: Anticipated discharge in approximately 3-4 day(s).   [name] Ewing, MD 11/04/2016, 12:02 PM P [phone]

## 2016-11-04 NOTE — Consult Note (Addendum)
ORTHOPAEDIC CONSULTATION  REQUESTING PHYSICIAN: Oval Linsey, MD  Chief Complaint: Abscess ulceration swelling left great toe  HPI: [name] [name] is a 31 y.o. male who presents with chronic osteomyelitis of the left great toe. Patient is a type I diabetic diagnosed at age 41. Patient's most recent hemoglobin A1c was 11.8.  Past Medical History:  Diagnosis Date  . Asthma   . Diabetes mellitus without complication ([place])   . Hypertension    Past Surgical History:  Procedure Laterality Date  . DRESSING CHANGE UNDER ANESTHESIA N/A 04/13/2016   Procedure: DRESSING CHANGE UNDER ANESTHESIA;  Surgeon: Johnathan Hausen, MD;  Location: [facility];  Service: General;  Laterality: N/A;  . HERNIA REPAIR     x2 when he was a baby  . INCISION AND DRAINAGE ABSCESS Right 09/03/2015   Procedure: INCISION AND DRAINAGE ABSCESS;  Surgeon: Roseanne Kaufman, MD;  Location: [facility];  Service: Orthopedics;  Laterality: Right;  . INCISION AND DRAINAGE PERIRECTAL ABSCESS N/A 04/11/2016   Procedure: IRRIGATION AND DEBRIDEMENT RECTAL ABSCESS;  Surgeon: Johnathan Hausen, MD;  Location: [facility];  Service: General;  Laterality: N/A;  . IRRIGATION AND DEBRIDEMENT ABSCESS Left 08/31/2015   Procedure: IRRIGATION AND DEBRIDEMENT ABSCESS LEFT SHOULDER;  Surgeon: Alphonsa Overall, MD;  Location: [facility];  Service: General;  Laterality: Left;  . WOUND DEBRIDEMENT N/A 02/24/2016   Procedure: SACRAL WOUND DEBRIDEMENT;  Surgeon: Georganna Skeans, MD;  Location: [facility];  Service: General;  Laterality: N/A;   Social History   Social History  . Marital status: Married    Spouse name: N/A  . Number of children: N/A  . Years of education: N/A   Social History Main Topics  . Smoking status: Never Smoker  . Smokeless tobacco: Never Used  . Alcohol use No  . Drug use: Yes    Types: Marijuana  . Sexual activity: Yes    Birth control/ protection: Diaphragm   Other Topics Concern  . Not on file   Social History Narrative  . No  narrative on file   Family History  Problem Relation Age of Onset  . Diabetes type II Mother   . Diabetes type II Father    - negative except otherwise stated in the family history section No Known Allergies Prior to Admission medications   Medication Sig Start Date End Date Taking? Authorizing Provider  acetaminophen (TYLENOL) 325 MG tablet Take 2 tablets (650 mg total) by mouth every 6 (six) hours as needed for mild pain or fever (for pain). 04/17/16   Regalado, Belkys A, MD  amoxicillin-clavulanate (AUGMENTIN) 875-125 MG tablet Take 1 tablet by mouth 2 (two) times daily. 10/29/16   Ladell Pier, MD  Blood Glucose Monitoring Suppl (TRUE METRIX GO GLUCOSE METER) w/Device KIT 1 each by Does not apply route every 8 (eight) hours as needed. 04/10/16   Maren Reamer, MD  ciprofloxacin (CIPRO) 500 MG tablet Take 1 tablet (500 mg total) by mouth 2 (two) times daily. 10/29/16   Ladell Pier, MD  escitalopram (LEXAPRO) 20 MG tablet Take 1 tablet (20 mg total) by mouth at bedtime. 10/04/16   Ladell Pier, MD  gabapentin (NEURONTIN) 300 MG capsule Take 1 capsule (300 mg total) by mouth 3 (three) times daily. 10/29/16   Ladell Pier, MD  gentamicin cream (GARAMYCIN) 0.1 % Apply 1 application topically daily. 10/29/16   Ladell Pier, MD  glucose blood test strip Use as instructed 04/10/16   Maren Reamer, MD  insulin  aspart (NOVOLOG FLEXPEN) 100 UNIT/ML FlexPen Inject 20 units 15 minutes before your two largest meals of the day Patient not taking: Reported on 08/24/2016 08/08/16   Lottie Mussel T, MD  Insulin Detemir (LEVEMIR FLEXPEN) 100 UNIT/ML Pen Inject 20 Units into the skin 2 (two) times daily. 10/29/16   Ladell Pier, MD  Insulin Pen Needle Flossie Buffy MINI PEN NEEDLES) 31G X 6 MM MISC Use as directed 10/04/16   Ladell Pier, MD  metFORMIN (GLUCOPHAGE XR) 500 MG 24 hr tablet Take 1 tablet (500 mg total) by mouth 2 (two) times daily. 10/04/16   Ladell Pier, MD  mupirocin cream (BACTROBAN) 2 % Apply 1 application topically 2 (two) times daily. 10/04/16   Ladell Pier, MD  pravastatin (PRAVACHOL) 20 MG tablet Take 1 tablet (20 mg total) by mouth daily. 10/04/16   Ladell Pier, MD  TRUEPLUS LANCETS 26G MISC 1 each by Does not apply route every 8 (eight) hours as needed. 04/10/16   Maren Reamer, MD   Dg Foot Complete Left  Result Date: 11/03/2016 CLINICAL DATA:  Diabetic ulcer over the left great toe. EXAM: LEFT FOOT - COMPLETE 3+ VIEW COMPARISON:  None. FINDINGS: The distal phalanx of the great toe is deformed and mottled in appearance. There appears to be gas in the adjacent soft tissues. Bony fragments are displaced based on the lateral view. The remainder of the great toe is intact. Remainder of the bones are normal. IMPRESSION: Soft tissue gas in the great toe distally with underlying bony erosion consistent with osteomyelitis. Displacement of bony fragments raises the possibility of superimposed fracture. Recommend clinical correlation. Electronically Signed   By: Dorise Bullion III M.D   On: 11/03/2016 13:07   - pertinent xrays, CT, MRI studies were reviewed and independently interpreted  Positive ROS: All other systems have been reviewed and were otherwise negative with the exception of those mentioned in the HPI and as above.  Physical Exam: General: Alert, no acute distress Psychiatric: Patient is competent for consent with normal mood and affect Lymphatic: No axillary or cervical lymphadenopathy Cardiovascular: No pedal edema Respiratory: No cyanosis, no use of accessory musculature GI: No organomegaly, abdomen is soft and non-tender  Skin: Examination patient has swelling and ulceration of the left great toe. He has good hair growth on the foot.   Neurologic: Patient does not have protective sensation bilateral lower extremities.   MUSCULOSKELETAL:  Examination patient has a good dorsalis pedis pulse. There is no  ascending cellulitis. There is sausage digit swelling of the great toe. Radiographs shows complete destruction of the tuft of the great toe consistent with chronic osteomyelitis.  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  Plan: Plan: We'll plan for amputation of the great toe through the MTP joint. The importance of proper glucose control and proper diet was discussed. Complications of uncontrolled diabetes was discussed.  Plan for surgery Monday or Tuesday. We will not need an MRI scan.  Thank you for the consult and the opportunity to see [name] [name], [place] [phone] 9:21 AM

## 2016-11-05 ENCOUNTER — Encounter (HOSPITAL_COMMUNITY): Payer: Self-pay | Admitting: *Deleted

## 2016-11-05 ENCOUNTER — Inpatient Hospital Stay (HOSPITAL_COMMUNITY): Payer: Self-pay | Admitting: Certified Registered Nurse Anesthetist

## 2016-11-05 ENCOUNTER — Encounter (HOSPITAL_COMMUNITY)
Admission: EM | Disposition: A | Discharge status: Home / Self Care | Payer: Self-pay | Source: Home / Self Care | Attending: Internal Medicine

## 2016-11-05 ENCOUNTER — Other Ambulatory Visit (INDEPENDENT_AMBULATORY_CARE_PROVIDER_SITE_OTHER): Payer: Self-pay | Admitting: Family

## 2016-11-05 DIAGNOSIS — Z7984 Long term (current) use of oral hypoglycemic drugs: Secondary | ICD-10-CM

## 2016-11-05 DIAGNOSIS — E1165 Type 2 diabetes mellitus with hyperglycemia: Secondary | ICD-10-CM

## 2016-11-05 DIAGNOSIS — L02612 Cutaneous abscess of left foot: Secondary | ICD-10-CM

## 2016-11-05 HISTORY — PX: AMPUTATION: SHX166

## 2016-11-05 LAB — GLUCOSE, CAPILLARY
GLUCOSE-CAPILLARY: 79 mg/dL (ref 65–99)
GLUCOSE-CAPILLARY: 150 mg/dL — AB (ref 65–99)
Glucose-Capillary: 363 mg/dL — ABNORMAL HIGH (ref 65–99)
Glucose-Capillary: 150 mg/dL — ABNORMAL HIGH (ref 65–99)
Glucose-Capillary: 409 mg/dL — ABNORMAL HIGH (ref 65–99)

## 2016-11-05 LAB — SURGICAL PCR SCREEN
MRSA, PCR: NEGATIVE
Staphylococcus aureus: NEGATIVE

## 2016-11-05 SURGERY — AMPUTATION, FOOT, RAY
Anesthesia: General | Laterality: Left

## 2016-11-05 MED ORDER — ACETAMINOPHEN 650 MG RE SUPP: 650.0000 mg | Freq: Four times a day (QID) | RECTAL | Status: DC | PRN

## 2016-11-05 MED ORDER — METOCLOPRAMIDE HCL 5 MG PO TABS
5.0000 mg | ORAL_TABLET | Freq: Three times a day (TID) | ORAL | Status: DC | PRN
  Filled 2016-11-05: qty 2

## 2016-11-05 MED ORDER — PROMETHAZINE HCL 25 MG/ML IJ SOLN: 6.2500 mg | INTRAMUSCULAR | Status: DC | PRN

## 2016-11-05 MED ORDER — OXYCODONE HCL 5 MG PO TABS: 5.0000 mg | ORAL_TABLET | Freq: Once | ORAL | Status: DC | PRN

## 2016-11-05 MED ORDER — MEPERIDINE HCL 25 MG/ML IJ SOLN: 6.2500 mg | INTRAMUSCULAR | Status: DC | PRN

## 2016-11-05 MED ORDER — OXYCODONE HCL 5 MG/5ML PO SOLN: 5.0000 mg | Freq: Once | ORAL | Status: DC | PRN

## 2016-11-05 MED ORDER — VANCOMYCIN HCL IN DEXTROSE 1-5 GM/200ML-% IV SOLN
1000.0000 mg | Freq: Three times a day (TID) | INTRAVENOUS | Status: AC
  Administered 2016-11-05 – 2016-11-07 (×8): 1000 mg via INTRAVENOUS
  Filled 2016-11-05 (×9): qty 200

## 2016-11-05 MED ORDER — ADULT MULTIVITAMIN W/MINERALS CH
1.0000 | ORAL_TABLET | Freq: Every day | ORAL | Status: DC
Start: 2016-11-05 — End: 2016-11-08
  Administered 2016-11-06 – 2016-11-08 (×3): 1 via ORAL
  Filled 2016-11-05 (×3): qty 1

## 2016-11-05 MED ORDER — BISACODYL 10 MG RE SUPP: 10.0000 mg | Freq: Every day | RECTAL | Status: DC | PRN

## 2016-11-05 MED ORDER — PROPOFOL 10 MG/ML IV BOLUS
INTRAVENOUS | Status: AC
  Filled 2016-11-05: qty 40

## 2016-11-05 MED ORDER — INSULIN ASPART 100 UNIT/ML ~~LOC~~ SOLN
9.0000 [IU] | Freq: Once | SUBCUTANEOUS | Status: AC
  Administered 2016-11-05: 9 [IU] via SUBCUTANEOUS

## 2016-11-05 MED ORDER — MIDAZOLAM HCL 2 MG/2ML IJ SOLN
INTRAMUSCULAR | Status: AC
  Filled 2016-11-05: qty 2

## 2016-11-05 MED ORDER — LACTATED RINGERS IV SOLN
Freq: Once | INTRAVENOUS | Status: AC
  Administered 2016-11-05: 17:00:00 via INTRAVENOUS

## 2016-11-05 MED ORDER — POLYETHYLENE GLYCOL 3350 17 G PO PACK: 17.0000 g | PACK | Freq: Every day | ORAL | Status: DC | PRN

## 2016-11-05 MED ORDER — OXYCODONE HCL 5 MG PO TABS
5.0000 mg | ORAL_TABLET | ORAL | Status: DC | PRN
  Administered 2016-11-05 – 2016-11-06 (×2): 10 mg via ORAL
  Administered 2016-11-07 (×2): 5 mg via ORAL
  Filled 2016-11-05: qty 1
  Filled 2016-11-05: qty 2
  Filled 2016-11-05: qty 1
  Filled 2016-11-05: qty 2

## 2016-11-05 MED ORDER — DOCUSATE SODIUM 100 MG PO CAPS
100.0000 mg | ORAL_CAPSULE | Freq: Two times a day (BID) | ORAL | Status: DC
  Administered 2016-11-05 – 2016-11-08 (×2): 100 mg via ORAL
  Filled 2016-11-05 (×4): qty 1

## 2016-11-05 MED ORDER — CHLORHEXIDINE GLUCONATE 4 % EX LIQD
60.0000 mL | Freq: Once | CUTANEOUS | Status: AC
  Administered 2016-11-05: 4 via TOPICAL
  Filled 2016-11-05: qty 60

## 2016-11-05 MED ORDER — INSULIN DETEMIR 100 UNIT/ML ~~LOC~~ SOLN
25.0000 [IU] | Freq: Two times a day (BID) | SUBCUTANEOUS | Status: DC
  Administered 2016-11-05 – 2016-11-07 (×4): 25 [IU] via SUBCUTANEOUS
  Filled 2016-11-05 (×4): qty 0.25

## 2016-11-05 MED ORDER — INSULIN ASPART 100 UNIT/ML ~~LOC~~ SOLN
6.0000 [IU] | Freq: Three times a day (TID) | SUBCUTANEOUS | Status: DC
  Administered 2016-11-06 – 2016-11-07 (×4): 6 [IU] via SUBCUTANEOUS

## 2016-11-05 MED ORDER — MAGNESIUM CITRATE PO SOLN: 1.0000 | Freq: Once | ORAL | Status: DC | PRN

## 2016-11-05 MED ORDER — CEFAZOLIN SODIUM-DEXTROSE 2-4 GM/100ML-% IV SOLN
2.0000 g | INTRAVENOUS | Status: AC
  Administered 2016-11-05: 2 g via INTRAVENOUS
  Filled 2016-11-05: qty 100

## 2016-11-05 MED ORDER — PIPERACILLIN-TAZOBACTAM 3.375 G IVPB
3.3750 g | Freq: Three times a day (TID) | INTRAVENOUS | Status: DC
  Administered 2016-11-05 – 2016-11-07 (×8): 3.375 g via INTRAVENOUS
  Filled 2016-11-05 (×10): qty 50

## 2016-11-05 MED ORDER — FENTANYL CITRATE (PF) 100 MCG/2ML IJ SOLN
INTRAMUSCULAR | Status: DC | PRN
  Administered 2016-11-05: 50 ug via INTRAVENOUS
  Administered 2016-11-05 (×2): 25 ug via INTRAVENOUS

## 2016-11-05 MED ORDER — PHENYLEPHRINE HCL 10 MG/ML IJ SOLN
INTRAMUSCULAR | Status: DC | PRN
  Administered 2016-11-05 (×2): 120 ug via INTRAVENOUS
  Administered 2016-11-05: 160 ug via INTRAVENOUS

## 2016-11-05 MED ORDER — METHOCARBAMOL 1000 MG/10ML IJ SOLN
500.0000 mg | Freq: Four times a day (QID) | INTRAVENOUS | Status: DC | PRN
  Filled 2016-11-05: qty 5

## 2016-11-05 MED ORDER — INSULIN ASPART 100 UNIT/ML ~~LOC~~ SOLN: 3.0000 [IU] | Freq: Once | SUBCUTANEOUS | Status: DC

## 2016-11-05 MED ORDER — FENTANYL CITRATE (PF) 250 MCG/5ML IJ SOLN
INTRAMUSCULAR | Status: AC
Start: 2016-11-05 — End: ?
  Filled 2016-11-05: qty 5

## 2016-11-05 MED ORDER — 0.9 % SODIUM CHLORIDE (POUR BTL) OPTIME
TOPICAL | Status: DC | PRN
  Administered 2016-11-05: 1000 mL

## 2016-11-05 MED ORDER — ACETAMINOPHEN 325 MG PO TABS: 650.0000 mg | ORAL_TABLET | Freq: Four times a day (QID) | ORAL | Status: DC | PRN

## 2016-11-05 MED ORDER — ONDANSETRON HCL 4 MG/2ML IJ SOLN
INTRAMUSCULAR | Status: DC | PRN
  Administered 2016-11-05: 4 mg via INTRAVENOUS

## 2016-11-05 MED ORDER — PROPOFOL 10 MG/ML IV BOLUS
INTRAVENOUS | Status: DC | PRN
  Administered 2016-11-05: 200 mg via INTRAVENOUS

## 2016-11-05 MED ORDER — HYDROMORPHONE HCL 1 MG/ML IJ SOLN: 0.2500 mg | INTRAMUSCULAR | Status: DC | PRN

## 2016-11-05 MED ORDER — METOCLOPRAMIDE HCL 5 MG/ML IJ SOLN: 5.0000 mg | Freq: Three times a day (TID) | INTRAMUSCULAR | Status: DC | PRN

## 2016-11-05 MED ORDER — MIDAZOLAM HCL 5 MG/5ML IJ SOLN
INTRAMUSCULAR | Status: DC | PRN
  Administered 2016-11-05: 2 mg via INTRAVENOUS

## 2016-11-05 MED ORDER — HYDROMORPHONE HCL 1 MG/ML IJ SOLN
1.0000 mg | INTRAMUSCULAR | Status: DC | PRN
  Administered 2016-11-06: 1 mg via INTRAVENOUS
  Filled 2016-11-05: qty 1

## 2016-11-05 MED ORDER — ONDANSETRON HCL 4 MG PO TABS: 4.0000 mg | ORAL_TABLET | Freq: Four times a day (QID) | ORAL | Status: DC | PRN

## 2016-11-05 MED ORDER — LIDOCAINE 2% (20 MG/ML) 5 ML SYRINGE
INTRAMUSCULAR | Status: DC | PRN
  Administered 2016-11-05: 90 mg via INTRAVENOUS

## 2016-11-05 MED ORDER — ONDANSETRON HCL 4 MG/2ML IJ SOLN: 4.0000 mg | Freq: Four times a day (QID) | INTRAMUSCULAR | Status: DC | PRN

## 2016-11-05 MED ORDER — METHOCARBAMOL 500 MG PO TABS
500.0000 mg | ORAL_TABLET | Freq: Four times a day (QID) | ORAL | Status: DC | PRN
  Administered 2016-11-05: 500 mg via ORAL
  Filled 2016-11-05: qty 1

## 2016-11-05 MED ORDER — SODIUM CHLORIDE 0.9 % IV SOLN: INTRAVENOUS | Status: DC

## 2016-11-05 SURGICAL SUPPLY — 36 items
BLADE SAW SGTL MED 73X18.5 STR (BLADE) IMPLANT
BLADE SURG 21 STRL SS (BLADE) ×3 IMPLANT
BNDG COHESIVE 4X5 TAN STRL (GAUZE/BANDAGES/DRESSINGS) ×3 IMPLANT
BNDG GAUZE ELAST 4 BULKY (GAUZE/BANDAGES/DRESSINGS) ×3 IMPLANT
COVER SURGICAL LIGHT HANDLE (MISCELLANEOUS) ×3 IMPLANT
DRAPE U-SHAPE 47X51 STRL (DRAPES) ×6 IMPLANT
DRSG ADAPTIC 3X8 NADH LF (GAUZE/BANDAGES/DRESSINGS) ×3 IMPLANT
DRSG PAD ABDOMINAL 8X10 ST (GAUZE/BANDAGES/DRESSINGS) ×3 IMPLANT
DURAPREP 26ML APPLICATOR (WOUND CARE) ×3 IMPLANT
ELECT CAUTERY BLADE 6.4 (BLADE) ×3 IMPLANT
ELECT REM PT RETURN 9FT ADLT (ELECTROSURGICAL) ×3
ELECTRODE REM PT RTRN 9FT ADLT (ELECTROSURGICAL) ×1 IMPLANT
GAUZE SPONGE 4X4 12PLY STRL (GAUZE/BANDAGES/DRESSINGS) ×3 IMPLANT
GLOVE BIOGEL PI IND STRL 9 (GLOVE) ×1 IMPLANT
GLOVE BIOGEL PI INDICATOR 9 (GLOVE) ×2
GLOVE SURG ORTHO 9.0 STRL STRW (GLOVE) ×3 IMPLANT
GLOVE SURG SS PI 6.0 STRL IVOR (GLOVE) ×3 IMPLANT
GOWN STRL REUS W/ TWL LRG LVL3 (GOWN DISPOSABLE) ×2 IMPLANT
GOWN STRL REUS W/ TWL XL LVL3 (GOWN DISPOSABLE) ×1 IMPLANT
GOWN STRL REUS W/TWL LRG LVL3 (GOWN DISPOSABLE) ×4
GOWN STRL REUS W/TWL XL LVL3 (GOWN DISPOSABLE) ×2
KIT BASIN OR (CUSTOM PROCEDURE TRAY) ×3 IMPLANT
KIT ROOM TURNOVER OR (KITS) ×3 IMPLANT
NS IRRIG 1000ML POUR BTL (IV SOLUTION) ×6 IMPLANT
PACK ORTHO EXTREMITY (CUSTOM PROCEDURE TRAY) ×3 IMPLANT
PAD ARMBOARD 7.5X6 YLW CONV (MISCELLANEOUS) ×6 IMPLANT
PENCIL BUTTON HOLSTER BLD 10FT (ELECTRODE) ×3 IMPLANT
STOCKINETTE IMPERVIOUS LG (DRAPES) ×3 IMPLANT
SUT ETHILON 2 0 PSLX (SUTURE) ×3 IMPLANT
SUT SILK 2 0 TIES 17X18 (SUTURE) ×2
SUT SILK 2-0 18XBRD TIE BLK (SUTURE) ×1 IMPLANT
TOWEL GREEN STERILE FF (TOWEL DISPOSABLE) ×3 IMPLANT
TOWEL OR 17X26 10 PK STRL BLUE (TOWEL DISPOSABLE) IMPLANT
TUBE CONNECTING 12'X1/4 (SUCTIONS) ×1
TUBE CONNECTING 12X1/4 (SUCTIONS) ×2 IMPLANT
YANKAUER SUCT BULB TIP NO VENT (SUCTIONS) ×3 IMPLANT

## 2016-11-05 NOTE — Progress Notes (Signed)
Patient going to OR. Short stay was called and report was given to the nurse who is going to receive the patient.

## 2016-11-05 NOTE — Op Note (Signed)
11/03/2016 - 11/05/2016  5:48 PM  PATIENT:  [name] [name]    PRE-OPERATIVE DIAGNOSIS:  INFECTED LEFT GREAT TOE  POST-OPERATIVE DIAGNOSIS:  Same  PROCEDURE:  AMPUTATION LEFT GREAT TOE, first ray. Local tissue rearrangement for wound closure 9 x 3 cm.  SURGEON:  Newt Minion, MD  PHYSICIAN ASSISTANT:None ANESTHESIA:   General  PREOPERATIVE INDICATIONS:  [name] is a  31 y.o. male with a diagnosis of INFECTED LEFT GREAT TOE who failed conservative measures and elected for surgical management.    The risks benefits and alternatives were discussed with the patient preoperatively including but not limited to the risks of infection, bleeding, nerve injury, cardiopulmonary complications, the need for revision surgery, among others, and the patient was willing to proceed.  OPERATIVE IMPLANTS: None  OPERATIVE FINDINGS: Abscess extended into the MTP joint as well as proximal along the first metatarsal.  OPERATIVE PROCEDURE: Patient was brought to the operating room and underwent a general anesthetic. After adequate levels of anesthesia were obtained patient's left lower extremity was prepped using DuraPrep draped into a sterile field a timeout was called. A fishmouth incision was made just distal to the MTP joint. A large abscess was encountered and this extended into the MTP joint. The incision was then extended proximal to the base of the first metatarsal and the abscess extended up to the midfoot region. The first ray was amputated through the base of the first metatarsal. Electrocautery was used for hemostasis. The necrotic tissue both plantar and dorsal was excised. The wound was irrigated with normal saline there was no tissue that appeared to be in contact with the infection after debridement. Local tissue rearrangement was then used for wound closure 3 x 9 cm. 2-0 nylon was used. The wound was covered with a sterile dressing patient was extubated taken the PACU in stable condition  Would  recommend continue IV antibiotics for 48 hours and then 3 weeks of oral antibiotics at time of discharge.

## 2016-11-05 NOTE — Progress Notes (Signed)
Pharmacy Antibiotic Note [name] [name] is a 31 y.o. male admitted on 11/03/2016 with concern for osteomyelitis of left great toe. Pharmacy has been consulted for vancomycin dosing. SCr trending down and wnl, WBC 24, Tmax 103.1.  Plan: Adjust Vancomycin to 1000 mg IV every 8 hours.  Goal trough 15-20 mcg/mL.  Monitor clinical progress, cultures/sensitivities, renal function, abx plan Vancomycin trough at new steady state to assess for accumulation   Height: 5\' 7"  (170.2 cm) Weight: 205 lb (93 kg) IBW/kg (Calculated) : 66.1  Temp (24hrs), Avg:101.2 F (38.4 C), Min:98.7 F (37.1 C), Max:103.1 F (39.5 C)   Recent Labs Lab 11/03/16 1233 11/03/16 1257 11/04/16 0618  WBC 25.3*  --  24.0*  CREATININE 1.05  --  0.82  LATICACIDVEN  --  1.74  --     Estimated Creatinine Clearance: 143.3 mL/min (by C-G formula based on SCr of 0.82 mg/dL).    No Known Allergies  Antibiotics this admission: 8/18 CTX >>8/20 8/18 flagyl >>8/20 8/18 vancomycin >> 8/20 Zosyn>>  Cultures: 8/18: MRSA PCR neg 8/18: urine sent   Thank you for allowing [place] to participate in this patients care.  Jens Som, PharmD Clinical phone for 11/05/2016 from 7a-3:30p: [phone] If after 3:30p, please call main pharmacy at: [phone] 11/05/2016 12:29 PM

## 2016-11-05 NOTE — Progress Notes (Signed)
According with CBG result patient must receive Insulin aspart. 3 units per sliding scale and additional 6 units meal coverage. When the Insulin was scanned the message was that iti is not time for that medicine. Per Pharmacy, patient received a total of 9 units Insulin once. Will continue to monitor.

## 2016-11-05 NOTE — Anesthesia Postprocedure Evaluation (Signed)
Anesthesia Post Note  Patient: Alante Tolan  Procedure(s) Performed: Procedure(s) (LRB): AMPUTATION LEFT GREAT TOE (Left)     Patient location during evaluation: PACU Anesthesia Type: General Level of consciousness: awake and alert Pain management: pain level controlled Vital Signs Assessment: post-procedure vital signs reviewed and stable Respiratory status: spontaneous breathing, nonlabored ventilation, patient connected to nasal cannula oxygen and respiratory function stable Cardiovascular status: blood pressure returned to baseline and stable Postop Assessment: no signs of nausea or vomiting Anesthetic complications: no    Last Vitals:  Vitals:   11/05/16 1806 11/05/16 1825  BP: 104/73 110/65  Pulse: 92   Resp: 17 17  Temp:  36.9 C  SpO2: 97% 98%    Last Pain:  Vitals:   11/05/16 1825  TempSrc: Oral  PainSc:                  Lynda Rainwater

## 2016-11-05 NOTE — Discharge Summary (Signed)
Name: [name] [name] MRN: [id number] DOB: April 17, 1985 31 y.o. PCP: Ladell Pier, MD  Date of Admission: 11/03/2016 11:41 AM Date of Discharge: 11/08/2016 Attending Physician: Oval Linsey, MD  Discharge Diagnosis: 1. Osteomyelitis of left great toe 2. Type 2 diabetes 3. AKI 4. Acute cystitis  Active Problems:   Osteomyelitis ([place])   Cutaneous abscess of left foot   Acute cystitis with positive culture   Discharge Medications: Allergies as of 11/08/2016   No Known Allergies     Medication List    STOP taking these medications   amoxicillin-clavulanate 875-125 MG tablet Commonly known as:  AUGMENTIN   ciprofloxacin 500 MG tablet Commonly known as:  CIPRO   metFORMIN 500 MG 24 hr tablet Commonly known as:  GLUCOPHAGE XR     TAKE these medications   acetaminophen 325 MG tablet Commonly known as:  TYLENOL Take 2 tablets (650 mg total) by mouth every 6 (six) hours as needed for mild pain or fever (for pain).   doxycycline 100 MG tablet Commonly known as:  VIBRA-TABS Take 1 tablet (100 mg total) by mouth every 12 (twelve) hours.   escitalopram 20 MG tablet Commonly known as:  LEXAPRO Take 1 tablet (20 mg total) by mouth at bedtime.   gabapentin 300 MG capsule Commonly known as:  NEURONTIN Take 1 capsule (300 mg total) by mouth 3 (three) times daily.   gentamicin cream 0.1 % Commonly known as:  GARAMYCIN Apply 1 application topically daily.   insulin aspart 100 UNIT/ML FlexPen Commonly known as:  NOVOLOG FLEXPEN Inject 20 units 15 minutes before your two largest meals of the day What changed:  how much to take  how to take this  when to take this  additional instructions   Insulin Detemir 100 UNIT/ML Pen Commonly known as:  LEVEMIR FLEXPEN Inject 20 Units into the skin 2 (two) times daily.   mupirocin cream 2 % Commonly known as:  BACTROBAN Apply 1 application topically 2 (two) times daily. What changed:  when to take this  reasons to  take this   oxyCODONE 5 MG immediate release tablet Commonly known as:  Oxy IR/ROXICODONE Take 1 tablet (5 mg total) by mouth every 8 (eight) hours as needed for breakthrough pain.   pravastatin 20 MG tablet Commonly known as:  PRAVACHOL Take 1 tablet (20 mg total) by mouth daily.            Durable Medical Equipment        Start     Ordered   11/08/16 1500  For home use only DME Bedside commode  Once    Question:  Patient needs a bedside commode to treat with the following condition  Answer:  Difficulty in weight bearing   11/08/16 1500   11/08/16 1457  For home use only DME standard manual wheelchair with seat cushion  Once    Comments:  Patient had first ray amputation on August 20 and is non-weight bearing on the left foot for at least 2 weeks during his recovery period. PT has evaluated him and he has weakness of the right foot, impairing his ability to move around and perform daily activities in the home. A walker/crutches will not resolve issue with performing activities of daily living because he is not weight bearing and is not able to bear all of his weight on his right side. A wheelchair will allow patient to safely perform daily activities. Patient can safely propel the wheelchair in the home or has  a caregiver who can provide assistance.  Accessories: elevating leg rests (ELRs), wheel locks, extensions and anti-tippers.   11/08/16 1500       Discharge Care Instructions        Start     Ordered   11/08/16 0000  doxycycline (VIBRA-TABS) 100 MG tablet  Every 12 hours     11/08/16 1520   11/08/16 0000  oxyCODONE (OXY IR/ROXICODONE) 5 MG immediate release tablet  Every 8 hours PRN     11/08/16 1520   11/08/16 0000  Increase activity slowly     11/08/16 1520   11/08/16 0000  Diet - low sodium heart healthy     11/08/16 1520   11/08/16 0000  Call MD for:  temperature >100.4     11/08/16 1520   11/08/16 0000  Call MD for:  severe uncontrolled pain     11/08/16 1520     11/08/16 0000  Call MD for:  redness, tenderness, or signs of infection (pain, swelling, redness, odor or green/yellow discharge around incision site)     11/08/16 1520   11/08/16 0000  Call MD for:  hives     11/08/16 1520      Disposition and follow-up:   [name] [name] was discharged from [facility] in Stable condition.  At the hospital follow up visit please address:  1.  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 Prior to surgery, patient's left great toe ulcer and left foot erythema appeared to worsen and he was febrile, even while on vancomycin, ceftriaxone, and metronidazole. We changed his antibiotics to vancomycin and zosyn for better coverage. Continued IV vanc/zosyn for 48 hours post-op. Discharged on 3 weeks of doxycycline. Please assess his left foot.  2.  Diabetes. BG elevated in hospital. A1c 11.0. Increased his insulin regimen to 27 units Levemir BID and 20 units Novolog TID with meals. No sliding scale insulin due to concern that he would not be able to adjust as needed. Please adjust insulin regimen as necessary. We also held his metformin on discharge due to a slight bump in his Cr (0.82 -> 1.48) and chronic diarrhea. Please assess his renal function and diarrhea and restart metformin if appropriate.  3.  Chronic diarrhea. Patient stated that he has had diarrhea for the last 4-5 years. We held his metformin on discharge to see whether his diarrhea was related to the metformin. If his diarrhea has not improved, you can restart his metformin and continue work-up for his chronic diarrhea.  4.  Possible atrial flutter. EKG prior to surgery showed possible atrial flutter. Spoke to a cardiologist who was not convinced that this was true atrial flutter. Repeat EKG two days later with NSR. Telemetry without any acute events. CHADS2-VASc score at least 1. With uncertain atrial flutter and equivocal CHADS2-VASc score, we did not  start him on anticoagulation. We did order an echocardiogram to assess his cardiac function.  5.  Depression. Patient working through some issues with his wife at home. Endorsed suicidal and homicidal ideation but stated "I'm not stupid enough to do it". Please consider adjusting antidepressive medication as needed.  6.  AKI. Possibly AIN secondary to vancomycin/zosyn. This was discontinued on 8/22. His Cr bumped from 0.82 to 1.48 on discharge. We set him up for an appointment at our Internal Medicine Clinic to re-check his Cr. Please re-assess during the visit.  7.  Labs / imaging needed at time of follow-up: BMP -  Assess renal function  8.  Pending labs/ test needing follow-up: Echo  Follow-up Appointments: Follow-up Information    Newt Minion, MD Follow up in 1 week(s).   Specialty:  Orthopedic Surgery Contact information: [place] [address] [phone]        Ladell Pier, MD Follow up in 2 week(s).   Specialty:  Internal Medicine Contact information: [address] [place]  [address] [phone]           Hospital Course by problem list: Active Problems:   Osteomyelitis ([place])   Cutaneous abscess of left foot   Acute cystitis with positive culture   1. Osteomyelitis of left great toe and possible cellulitis of left medial foot Patient was admitted on 8/18 with 3 weeks of worsening wound on left great toe and pain/erythema in left medial foot. Foot xray was concerning for osteomyelitis. He was started on vancomycin, ceftriaxone, and metronidazole (for history of MRSA wound infections). He continued to spike fevers (up to 103.1) and erythema around his left foot appeared worsened. Antibiotics were changed to vancomycin and zosyn for broader coverage. In the OR, the infection extended further than expected, so he had first ray amputation of left foot on 8/20. He was continued on IV vanc/zosyn for 48 hours post-op. Discharged on 3  weeks of doxycycline (8/23-9/13). Remained afebrile after surgery, vitals stable, and pain had improved.  2. Type 2 diabetes Patient has had some prior difficulty with access to diabetes medications. A1c in the hospital was 11.0. Blood glucose remained elevated throughout hospitalization. We increased his insulin regimen to 27u Levemir BID and 20u Novolog TID with meals on discharge. No SSI due to concern that he would not be able to adjust his insulin dosing himself. We emphasized the importance of better control of his diabetes. We held his metformin on discharge due to AKI (Cr 0.82 -> 1.48) and to assess whether his diarrhea improved while off metformin.  3. Acute cystitis UA positive for infection. On admission, patient denied dysuria, urinary frequency, or other urinary symptoms. However, patient was just seen by PCP on 8/13 with complaints of dysuria and positive UA. He was started on ciprofloxacin at that time. I am not sure whether he was able to take that medication. Urine culture with pan-sensitive Staph Aureus. Patient may have been bacteremic from osteomyelitis, resulting in UTI. No blood cultures drawn on admission. Patient received IV antibiotics in the hospital as above. Patient was discharged on 3 weeks of doxycycline for osteomyelitis and cellulitis as above.  4. Possible atrial flutter EKG prior to surgery showed atrial flutter. Spoke to a cardiologist who did not feel that this was true atrial flutter and that it might have been a tremor or that the patient was moving during the EKG. Repeat EKG two days later showed NSR. CHADS2-VASc score of at least 1, with 0.9% yearly risk of stroke/TIA. With questionable atrial flutter and equivocal CHADS2-VASc score, we did not put him on anticoagulation.  Patient denied chest pain, palpitations, or shortness of breath while in the hospital. However, he did inform us that he was told he had a hole in his heart when he was younger but is not sure  anything was done after that. We ordered an echocardiogram to assess this.  5. Depression Patient reported personal issues at home with his wife. Endorsed suicidal ideation and homicidal ideation on admission but denied intent. He was continued on his home Lexapro 62m daily.  6. AKI,  possibly AIN secondary to vanc/zosyn Creatinine increased from 0.82 to 1.48. Patient was on vanc/zosyn, which was discontinued on 8/22. Metformin held on discharge. Please re-check his renal function with BMP.  Discharge Vitals:   BP 120/74 (BP Location: Left Arm)   Pulse 88   Temp 97.8 F (36.6 C) (Oral)   Resp 18   Ht _0  (1.702 m)   Wt 205 lb (93 kg)   SpO2 97%   BMI 32.11 kg/m   Pertinent Labs, Studies, and Procedures:  CBC Latest Ref Rng & Units 11/08/2016 11/07/2016 11/04/2016  WBC 4.0 - 10.5 K/uL 13.7(H) 15.0(H) 24.0(H)  Hemoglobin 13.0 - 17.0 g/dL 10.0(L) 10.2(L) 10.6(L)  Hematocrit 39.0 - 52.0 % 30.7(L) 30.8(L) 30.4(L)  Platelets 150 - 400 K/uL 407(H) 378 316   CMP Latest Ref Rng & Units 11/08/2016 11/07/2016 11/04/2016  Glucose 65 - 99 mg/dL 253(H) - 295(H)  BUN 6 - 20 mg/dL 11 - 7  Creatinine 0.61 - 1.24 mg/dL 1.48(H) 1.36(H) 0.82  Sodium 135 - 145 mmol/L 138 - 132(L)  Potassium 3.5 - 5.1 mmol/L 4.3 - 3.5  Chloride 101 - 111 mmol/L 98(L) - 95(L)  CO2 22 - 32 mmol/L 29 - 25  Calcium 8.9 - 10.3 mg/dL 9.1 - 8.7(L)  Total Protein 6.5 - 8.1 g/dL - - -  Total Bilirubin 0.3 - 1.2 mg/dL - - -  Alkaline Phos 38 - 126 U/L - - -  AST 15 - 41 U/L - - -  ALT 17 - 63 U/L - - -   HbA1c 11.0 Lactic acid 1.74 ESR 109 Prealbumin 10.6 TSH 0.525 UA with cloudy yellow urine, >/=500 glucose, small Hgb urine dipstick, 5 ketones, 30 protein, positive nitrite, large leukocytes, 0-5 RBC, too numerous to count WBC, rare bacteria, WBC clumps present UCx with pan-sensitive Staph aureus  Discharge Instructions: Discharge Instructions    Call MD for:  hives    Complete by:  As directed    Call MD for:   redness, tenderness, or signs of infection (pain, swelling, redness, odor or green/yellow discharge around incision site)    Complete by:  As directed    Call MD for:  severe uncontrolled pain    Complete by:  As directed    Call MD for:  temperature >100.4    Complete by:  As directed    Diet - low sodium heart healthy    Complete by:  As directed    Increase activity slowly    Complete by:  As directed      Signed: Colbert Ewing, MD  Internal Medicine, PGY-1 11/08/2016, 3:21 PM   P [phone]

## 2016-11-05 NOTE — Transfer of Care (Signed)
Immediate Anesthesia Transfer of Care Note  Patient: [name] [name]  Procedure(s) Performed: Procedure(s): AMPUTATION LEFT GREAT TOE (Left)  Patient Location: PACU  Anesthesia Type:General  Level of Consciousness: awake, alert , oriented and patient cooperative  Airway & Oxygen Therapy: Patient Spontanous Breathing  Post-op Assessment: Report given to RN and Post -op Vital signs reviewed and stable  Post vital signs: Reviewed and stable  Last Vitals:  Vitals:   11/05/16 1521 11/05/16 1753  BP: (!) 141/89   Pulse: (!) 103   Resp: 20   Temp: 37.2 C (P) 36.7 C  SpO2: 98%     Last Pain:  Vitals:   11/05/16 1521  TempSrc: Oral  PainSc:       Patients Stated Pain Goal: 3 (21/03/12 8118)  Complications: No apparent anesthesia complications

## 2016-11-05 NOTE — Anesthesia Procedure Notes (Signed)
Procedure Name: LMA Insertion Date/Time: 11/05/2016 5:15 PM Performed by: Salli Quarry Braleigh Massoud Pre-anesthesia Checklist: Patient identified, Emergency Drugs available, Suction available and Patient being monitored Patient Re-evaluated:Patient Re-evaluated prior to induction Oxygen Delivery Method: Circle System Utilized Preoxygenation: Pre-oxygenation with 100% oxygen Induction Type: IV induction Ventilation: Mask ventilation without difficulty LMA: LMA inserted LMA Size: 5.0 Number of attempts: 1 Placement Confirmation: positive ETCO2 Tube secured with: Tape Dental Injury: Teeth and Oropharynx as per pre-operative assessment

## 2016-11-05 NOTE — Progress Notes (Signed)
Initial Nutrition Assessment  DOCUMENTATION CODES:   Obesity unspecified  INTERVENTION:   -MVI daily  NUTRITION DIAGNOSIS:   Increased nutrient needs related to wound healing as evidenced by estimated needs.  GOAL:   Patient will meet greater than or equal to 90% of their needs  MONITOR:   PO intake, Supplement acceptance, Diet advancement, Labs, Weight trends, Skin, I & O's  REASON FOR ASSESSMENT:   Consult Wound healing  ASSESSMENT:   Mr. Yearwood is a 31 year old man with a history of diabetes, hypertension, hyperlipidemia, obesity, and depression who presents with left great toe pain associated with a wound 3 weeks. He is found to have a osteomyelitis of the left great toe which likely started as a neuropathic ulcer. He also has an overlying cellulitis and systemic response.  Pt admitted with osteomyelitis of lt great toe.   Case discussed with RN, who confirms pt scheduled for lt toe amputation later this afternoon. She reports pt is often noncompliant with DM self-management.   Spoke with pt, who reports he has been consuming 100% of meals during hospitalization. Pt shares PTA, he typically only consumes 1 meal per day (usually pasta) due to financial restraints. He has also been trying to consume less sugar and eats fruit when he craves sweet tasting foods. Pt reports he has lost about 30# within the past year, however, this is not confirmed via wt hx (wt has been stable per wt records). Pt wife shares that she believes pt has progressively lost weight over the past several years due to stress, uncontrolled DM, and depression.   Nutrition-Focused physical exam completed. Findings are no fat depletion, no muscle depletion, and no edema.   Discussed DM control with pt. He shares that he is greatful for the [facility], who have assisted him with routine medical care and medications. Pt shares that he often goes long periods of time without insulin,  due to transportation issues and long wait times to pick up medications at the clinic- he often waits until his next scheduled appointment at the clinic to pick up his scheduled medications (PTA DM regimen is 20 units insulin aspart BID before two largest meals, 500 mg metfomin BID). Currently receiving 0-20 units insulin aspart TID with meals, 6 units insulin aspart TID with meals, and 25 units insulin determir BID for inpatient glycemic control. RD stressed importance of compliance of DM self-management, including diet, to assist with post-op healing. Encouraged pt to continue to use community resources available to him to optimize glycemic control.   Labs reviewed: CBGS: 79-363. Last Hgb A1c: 11 (11/04/16).   Diet Order:  Diet NPO time specified Diet NPO time specified  Skin:  Reviewed, no issues  Last BM:  11/03/16  Height:   Ht Readings from Last 1 Encounters:  11/03/16 5\' 7"  (1.702 m)    Weight:   Wt Readings from Last 1 Encounters:  11/03/16 205 lb (93 kg)    Ideal Body Weight:  67.3 kg  BMI:  Body mass index is 32.11 kg/m.  Estimated Nutritional Needs:   Kcal:  1850-2050  Protein:  100-115 grams  Fluid:  > 1.8 L  EDUCATION NEEDS:   Education needs addressed  Londynn Sonoda A. Jimmye Norman, RD, LDN, CDE Pager: [phone] After hours Pager: [phone]

## 2016-11-05 NOTE — Progress Notes (Addendum)
Patient back from OR. Alert and oriented x 4; no complaints of pain or other discomfort. Left great toe amputated, dressing on left foot intact, clean, dry (Compression wrap). Will continue to monitor.

## 2016-11-05 NOTE — Progress Notes (Signed)
Patient ID: [name] [name], male   DOB: May 03, 1985, 31 y.o.   MRN: [id number] I'll plan on proceeding with amputation of the left great toe this afternoon around 4 or 5 PM. Patient was told to be nothing by mouth after 7:30 this morning.

## 2016-11-05 NOTE — Anesthesia Preprocedure Evaluation (Addendum)
Anesthesia Evaluation  Patient identified by MRN, date of birth, ID band Patient awake    Reviewed: Allergy & Precautions, NPO status , Patient's Chart, lab work & pertinent test results  Airway Mallampati: II  TM Distance: >3 FB Neck ROM: Full    Dental no notable dental hx.    Pulmonary asthma ,    Pulmonary exam normal breath sounds clear to auscultation       Cardiovascular hypertension, Normal cardiovascular exam Rhythm:Regular Rate:Normal     Neuro/Psych PSYCHIATRIC DISORDERS Depression negative neurological ROS     GI/Hepatic negative GI ROS, Neg liver ROS,   Endo/Other  diabetes, Type 2, Insulin Dependent, Oral Hypoglycemic AgentsObesity   Renal/GU negative Renal ROS     Musculoskeletal negative musculoskeletal ROS (+)   Abdominal   Peds  Hematology  (+) Blood dyscrasia, anemia ,   Anesthesia Other Findings Day of surgery medications reviewed with the patient.  Reproductive/Obstetrics                            Anesthesia Physical Anesthesia Plan  ASA: III  Anesthesia Plan: General   Post-op Pain Management:    Induction: Intravenous  PONV Risk Score and Plan: 2 and Ondansetron and Midazolam  Airway Management Planned: LMA  Additional Equipment:   Intra-op Plan:   Post-operative Plan: Extubation in OR  Informed Consent: I have reviewed the patients History and Physical, chart, labs and discussed the procedure including the risks, benefits and alternatives for the proposed anesthesia with the patient or authorized representative who has indicated his/her understanding and acceptance.   Dental advisory given  Plan Discussed with: CRNA  Anesthesia Plan Comments:         Anesthesia Quick Evaluation

## 2016-11-05 NOTE — Progress Notes (Signed)
Subjective:  Mr. Jeff reports continued pain in his toe. He spiked a fever to 103.1, currently 100.7. He does feel warm at night, although does not feel warm now. Dr. Sharol Given planning for amputation this afternoon. Currently NPO.  Objective:  Vital signs in last 24 hours: Vitals:   11/04/16 1550 11/04/16 2214 11/05/16 0056 11/05/16 0516  BP: (!) 141/88 (!) 143/92  125/88  Pulse: (!) 111 (!) 118  (!) 109  Resp: 16 20  16   Temp: (!) 102.2 F (39 C) (!) 103.1 F (39.5 C) 98.7 F (37.1 C) (!) 100.7 F (38.2 C)  TempSrc: Oral Oral Oral Oral  SpO2: 97% 99%  95%  Weight:      Height:       GEN: Lying in bed comfortably in NAD. Alert and oriented. RESP: Clear to auscultation bilaterally. No wheezes. CV: Normal rate and regular rhythm. No murmurs, gallops, or rubs. EXT: Black necrotic-looking ulcer on bottom of L great toe without drainage. Worse appearance than yesterday. Toenail fell off. Increased erythema and warmth of L great toe and medial foot around the arch.  Labs CBC Latest Ref Rng & Units 11/04/2016 11/03/2016 08/24/2016  WBC 4.0 - 10.5 K/uL 24.0(H) 25.3(H) 10.1  Hemoglobin 13.0 - 17.0 g/dL 10.6(L) 12.0(L) 14.0  Hematocrit 39.0 - 52.0 % 30.4(L) 33.3(L) 39.0  Platelets 150 - 400 K/uL 316 359 219   BMP Latest Ref Rng & Units 11/04/2016 11/03/2016 10/04/2016  Glucose 65 - 99 mg/dL 295(H) 380(H) 375(H)  BUN 6 - 20 mg/dL 7 9 13   Creatinine 0.61 - 1.24 mg/dL 0.82 1.05 0.76  BUN/Creat Ratio 9 - 20 - - 17  Sodium 135 - 145 mmol/L 132(L) 133(L) 136  Potassium 3.5 - 5.1 mmol/L 3.5 3.5 4.2  Chloride 101 - 111 mmol/L 95(L) 94(L) 97  CO2 22 - 32 mmol/L 25 28 23   Calcium 8.9 - 10.3 mg/dL 8.7(L) 9.4 8.9   Assessment/Plan:  Active Problems:   Osteomyelitis [facility])  Mr. Schildt is a 31yo male with PMH significant for poorly-controlled diabetes, HTN, HLD, and depression who presents with worsening left foot pain and great toe wound for the last 3 weeks with imaging concerning for  osteomyelitis.  # Osteomyelitis of L great toe, with overlying cellulitis of L medial foot Foot xray confirms concern for osteomyelitis. He is febrile. The medial foot and toe are erythematous, painful, and warm to the touch, appears worsened compared to yesterday. This likely represents an overlying cellulitis. ABIs normal. Ortho plans for surgery today. - Ortho consulted, appreciate their assistance --- OR today for amputation of L great toe --- NPO since 7:30am - Will switch ceftriaxone and metronidazole to zosyn for broader coverage due to worsened appearance of left foot this morning - Continue vancomycin - pain control: tylenol PRN + oxy 5mg  PRN  # Poorly controlled diabetes, likely type 2 Prescribed metformin, levemir 20 BID & novolog 20 BID at home BG in the high 200s-300s. A1c 11.0 - hold home metformin - Increase to Levemir 25u BID + 6u novolog with meals + SSI (resistant) - q4h CBG while NPO  # Acute cystitis UA positive for infection. - On antibiotics for cellulitis, as above - f/u UCx  # Depression Expresses suicidal and homicidal ideation but denies intent. Working through increased stress at home. Endorses increased sleepiness and decreased appetite - Continue home escitalopram 20mg   # HLD - continue home pravastatin 20mg   # FEN/GI - Diabetic diet  VTE PPx: Lovenox  Dispo: Anticipated  discharge in approximately 2-3 days  Colbert Ewing, MD  Internal Medicine, PGY-1 11/05/2016, 10:34 AM P [phone]

## 2016-11-06 ENCOUNTER — Encounter (HOSPITAL_COMMUNITY): Payer: Self-pay | Admitting: Orthopedic Surgery

## 2016-11-06 DIAGNOSIS — Z800 Family history of malignant neoplasm of digestive organs: Secondary | ICD-10-CM

## 2016-11-06 DIAGNOSIS — R45850 Homicidal ideations: Secondary | ICD-10-CM

## 2016-11-06 DIAGNOSIS — R45851 Suicidal ideations: Secondary | ICD-10-CM

## 2016-11-06 DIAGNOSIS — Z8739 Personal history of other diseases of the musculoskeletal system and connective tissue: Secondary | ICD-10-CM

## 2016-11-06 DIAGNOSIS — N3000 Acute cystitis without hematuria: Secondary | ICD-10-CM

## 2016-11-06 DIAGNOSIS — I10 Essential (primary) hypertension: Secondary | ICD-10-CM

## 2016-11-06 DIAGNOSIS — Z89412 Acquired absence of left great toe: Secondary | ICD-10-CM

## 2016-11-06 DIAGNOSIS — Z794 Long term (current) use of insulin: Secondary | ICD-10-CM

## 2016-11-06 LAB — GLUCOSE, CAPILLARY
GLUCOSE-CAPILLARY: 287 mg/dL — AB (ref 65–99)
GLUCOSE-CAPILLARY: 198 mg/dL — AB (ref 65–99)
Glucose-Capillary: 248 mg/dL — ABNORMAL HIGH (ref 65–99)
Glucose-Capillary: 299 mg/dL — ABNORMAL HIGH (ref 65–99)

## 2016-11-06 MED ORDER — ENSURE ENLIVE PO LIQD: 237.0000 mL | Freq: Two times a day (BID) | ORAL | Status: DC

## 2016-11-06 MED ORDER — GLUCERNA SHAKE PO LIQD
237.0000 mL | Freq: Two times a day (BID) | ORAL | Status: DC
  Administered 2016-11-06 – 2016-11-08 (×4): 237 mL via ORAL
  Filled 2016-11-06 (×3): qty 237

## 2016-11-06 NOTE — Evaluation (Signed)
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  Pt is not going to be able to get upstairs in his apt without help unless he can get control of RLE or maybe receive a Darco shoe with heel wb with Dr. Jess Barters permission.  Will check with medical staff to see how this idea progresses but otherwise is a SNF candidate.  Pt has no BR on level where his bedroom is.    Follow Up Recommendations SNF    Equipment Recommendations  Other (comment) (will need to determine if he can use crutches for stairs.)    Recommendations for Other Services       Precautions / Restrictions Precautions Precautions: Fall (telemetry) Restrictions Weight Bearing Restrictions: Yes LLE Weight Bearing: Non weight bearing      Mobility  Bed Mobility Overal bed mobility: Needs Assistance Bed Mobility: Supine to Sit     Supine to sit: Min assist     General bed mobility comments: used bed rail and HOB up  Transfers Overall transfer level: Needs assistance Equipment used: Rolling walker (2 wheeled);Crutches;1 person hand held assist Transfers: Sit to/from [facility] Sit to Stand: Min assist;Mod assist Stand pivot transfers: Min assist;Mod assist       General transfer comment: walker more stable and less assistance than crutches  Ambulation/Gait Ambulation/Gait assistance: Min assist Ambulation Distance (Feet): 6 Feet Assistive device: Rolling walker (2 wheeled);Crutches;1  person hand held assist   Gait velocity: reduced Gait velocity interpretation: Below normal speed for age/gender General Gait Details: hopping on RLE with crutches difficult to maintain NWB, L foot off floor with walker but no real endurance   Stairs            Wheelchair Mobility    Modified Rankin (Stroke Patients Only)       Balance Overall balance assessment: Needs assistance Sitting-balance support: Feet supported Sitting balance-Leahy Scale: Fair     Standing balance support: Bilateral upper extremity supported Standing balance-Leahy Scale: Poor                               Pertinent Vitals/Pain Pain Assessment: 0-10 Pain Score: 6  Pain Location: L foot when dependent Pain Descriptors / Indicators: Operative site guarding Pain Intervention(s): Limited activity within patient's tolerance;Monitored during session;Premedicated before session;Repositioned    Home Living Family/patient expects to be discharged to:: Private residence Living Arrangements: Alone Available Help at Discharge: Family;Available PRN/intermittently Type of Home: Apartment Home Access: Level entry     Home Layout: Two level Home Equipment: Walker - 2 wheels Additional Comments: pt reports his back abscess was 2 years ago after which he had BLE tone and tremors, could not reach down to floor to get an object after that.  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 L  foot was really bad     Hand Dominance        Extremity/Trunk Assessment   Upper Extremity Assessment Upper Extremity Assessment: Overall WFL for tasks assessed    Lower Extremity Assessment Lower Extremity Assessment: Generalized weakness (related to LT issue with tremors and tone on B quads)    Cervical / Trunk Assessment Cervical / Trunk Assessment: Normal  Communication   Communication: No  difficulties  Cognition Arousal/Alertness: Awake/alert Behavior During Therapy: WFL for tasks assessed/performed Overall Cognitive Status: No family/caregiver present to determine baseline cognitive functioning                                 General Comments: pt is not aware of his limitations with RLE until after his gait was attempted on RW and crutches      General Comments General comments (skin integrity, edema, etc.): L foot in ace wrap and has a cast boot for protection along with NWB    Exercises     Assessment/Plan    PT Assessment Patient needs continued PT services  PT Problem List Decreased strength;Decreased range of motion;Decreased activity tolerance;Decreased balance;Decreased mobility;Decreased coordination;Decreased knowledge of use of DME;Decreased safety awareness;Cardiopulmonary status limiting activity;Decreased skin integrity;Pain       PT Treatment Interventions DME instruction;Gait training;Stair training;Functional mobility training;Therapeutic activities;Therapeutic exercise;Balance training;Neuromuscular re-education;Patient/family education    PT Goals (Current goals can be found in the Care Plan section)  Acute Rehab PT Goals Patient Stated Goal: to get to apartment PT Goal Formulation: With patient Time For Goal Achievement: 11/20/16 Potential to Achieve Goals: Good    Frequency Min 4X/week   Barriers to discharge Inaccessible home environment;Decreased caregiver support alone in a stair environment    Co-evaluation               AM-PAC PT "6 Clicks" Daily Activity  Outcome Measure Difficulty turning over in bed (including adjusting bedclothes, sheets and blankets)?: Unable Difficulty moving from lying on back to sitting on the side of the bed? : Unable Difficulty sitting down on and standing up from a chair with arms (e.g., wheelchair, bedside commode, etc,.)?: Unable Help needed moving to and from a bed to chair  (including a wheelchair)?: A Little Help needed walking in hospital room?: A Lot Help needed climbing 3-5 steps with a railing? : Total 6 Click Score: 9    End of Session Equipment Utilized During Treatment: Gait belt Activity Tolerance: Patient limited by fatigue;Patient limited by pain;Other (comment) (crutches are not safe, cannot control LLE WBing) Patient left: in chair;with call bell/phone within reach;with chair alarm set Nurse Communication: Mobility status PT Visit Diagnosis: Unsteadiness on feet (R26.81);Muscle weakness (generalized) (M62.81);Ataxic gait (R26.0);Pain Pain - Right/Left: Left Pain - part of body: Ankle and joints of foot    Time: 1150-1235 PT Time Calculation (min) (ACUTE ONLY): 45 min   Charges:   PT Evaluation $PT Eval Moderate Complexity: 1 Mod PT Treatments $Gait Training: 8-22 mins $Therapeutic Exercise: 8-22 mins   PT G Codes:   PT G-Codes **NOT FOR INPATIENT CLASS** Functional Assessment Tool Used: AM-PAC 6 Clicks Basic Mobility    Ramond Dial 11/06/2016, 7:41 PM   Mee Hives, PT MS Acute Rehab Dept. Number: [place] and [place]

## 2016-11-06 NOTE — Progress Notes (Signed)
Subjective:  OR yesterday. S/p first ray amputation. Dr. Sharol Given recommends IV antibiotics x48 hours + 3 weeks of PO antibiotics after (doxycycline).  [name] [name] reports the pain medication is helping with the pain in his toe. He is currently afebrile.  He also notes that he has had chronic diarrhea for the last 4-5 years. Denies bloody diarrhea or particular associations with food that he knows of. He does have the symptoms at night. He has BMs up to 5-10 times per day. Occasionally is incontinent. Denies abdominal pain, N/V. Denies known family history of IBD. Does have a grandfather with colon cancer.  Objective:  Vital signs in last 24 hours: Vitals:   11/05/16 1806 11/05/16 1825 11/05/16 2235 11/06/16 0458  BP: 104/73 110/65 138/84 115/72  Pulse: 92  (!) 101 94  Resp: 17 17 18 17   Temp:  98.5 F (36.9 C) 98.9 F (37.2 C) 99.1 F (37.3 C)  TempSrc:  Oral Oral Oral  SpO2: 97% 98% 99% 95%  Weight:      Height:       GEN: Lying in bed comfortably in NAD. Alert and oriented. Left foot elevated on table. CV: Normal rate and regular rhythm. No murmurs, gallops, or rubs. EXT: Left foot bandaged and wrapped.  Labs CBC Latest Ref Rng & Units 11/04/2016 11/03/2016 08/24/2016  WBC 4.0 - 10.5 K/uL 24.0(H) 25.3(H) 10.1  Hemoglobin 13.0 - 17.0 g/dL 10.6(L) 12.0(L) 14.0  Hematocrit 39.0 - 52.0 % 30.4(L) 33.3(L) 39.0  Platelets 150 - 400 K/uL 316 359 219   BMP Latest Ref Rng & Units 11/04/2016 11/03/2016 10/04/2016  Glucose 65 - 99 mg/dL 295(H) 380(H) 375(H)  BUN 6 - 20 mg/dL 7 9 13   Creatinine 0.61 - 1.24 mg/dL 0.82 1.05 0.76  BUN/Creat Ratio 9 - 20 - - 17  Sodium 135 - 145 mmol/L 132(L) 133(L) 136  Potassium 3.5 - 5.1 mmol/L 3.5 3.5 4.2  Chloride 101 - 111 mmol/L 95(L) 94(L) 97  CO2 22 - 32 mmol/L 25 28 23   Calcium 8.9 - 10.3 mg/dL 8.7(L) 9.4 8.9   Assessment/Plan:  Active Problems:   Osteomyelitis (HCC)   Cutaneous abscess of left foot  [name] is a 31yo male with PMH  significant for poorly-controlled diabetes, HTN, HLD, and depression who presents with worsening left foot pain and great toe wound for the last 3 weeks with imaging concerning for osteomyelitis, now s/p first ray amputation 8/20.  # Osteomyelitis of L great toe, with overlying cellulitis of L medial foot, now s/p first ray amputation 8/20. Currently afebrile. Left foot is bandaged with a clean dressing. With source control and broader antibiotics (vanc/zosyn), patient appears clinically improved, so we will continue vanc/zosyn. - OR 8/20 for first ray amputation - Continue vanc/zosyn for 48 hours - pain control: tylenol 650mg  PRN + oxy 5-10mg  PRN + dilaudid IV PRN  # Poorly controlled diabetes, likely type 2 Prescribed metformin, levemir 20 BID & novolog 20 BID at home BG in the high 200s-300s. A1c 11.0 - hold home metformin for now - Levemir 25u BID + 6u novolog with meals + SSI (resistant) - CBG  # Chronic diarrhea 4-5 year history of chronic diarrhea, with symptoms occurring at night while sleeping as well. Patient thinks he may have started metformin around that time as well. The metformin may be causing his chronic diarrheal symptoms, however typically with metformin, the diarrhea will improve after being on the medication for a while. Another consideration is IBS. However, patients  with IBS typically have relief of symptoms at night while sleeping. Other etiologies may be IBD vs malabsorptive vs secretory vs chronic infectious. - will continue to hold metformin in the hospital - will discharge while holding metformin prior to PCP follow-up to see whether metformin may be contributing - outpatient follow up with PCP  # Acute cystitis UA positive for infection. - On antibiotics for cellulitis, as above - f/u UCx  # Depression Expresses suicidal and homicidal ideation but denies intent. Working through increased stress at home. Endorses increased sleepiness and decreased  appetite - Continue home escitalopram 20mg   # HLD - continue home pravastatin 20mg   # FEN/GI - Diabetic diet  VTE PPx: Lovenox  Dispo: Anticipated discharge in approximately 2-3 days  Colbert Ewing, MD  Internal Medicine, PGY-1 11/06/2016, 9:28 AM P [phone]  ADDENDUM: Patient had EKG yesterday that showed new atrial flutter with HR in 100s. Patient states that he feels like his heart occasionally skips a beat. Patient denies chest pain or shortness of breath currently. Will order repeat EKG and put patient on telemetry.  New onset atrial flutter. HR in 100s CHADS2-VASc is at least 1, with 0.9% yearly risk of stroke/TIA. This puts him at low-moderate risk, and antiplatelet therapy or anticoagulation should be considered. - repeat EKG - telemetry - TSH tomorrow AM  Colbert Ewing, MD Internal Medicine, PGY-1 11/06/2016, 2:36pm

## 2016-11-06 NOTE — Progress Notes (Signed)
Orthopedic Tech Progress Note Patient Details:  [name] [name] [birth year] [id number]  Ortho Devices Type of Ortho Device: Postop shoe/boot Ortho Device/Splint Location: lle Ortho Device/Splint Interventions: Ordered, Application, Adjustment   Karolee Stamps 11/06/2016, 1:18 AM

## 2016-11-06 NOTE — Progress Notes (Signed)
Patient ID: [name] [name], male   DOB: 10-28-1985, 31 y.o.   MRN: [id number] Postoperative day 1 status post first ray amputation. Patient had a large abscess that extended from the toe into the MTP joint and extended proximally. This required amputation of the first ray. All involved tissue was excised. Recommend discharge on 3 weeks of oral antibiotics, doxycycline it is an option. I will follow-up in the office in 1 week.

## 2016-11-07 ENCOUNTER — Ambulatory Visit (HOSPITAL_COMMUNITY): Admission: RE | Admit: 2016-11-07 | Payer: Self-pay | Source: Ambulatory Visit

## 2016-11-07 DIAGNOSIS — K529 Noninfective gastroenteritis and colitis, unspecified: Secondary | ICD-10-CM

## 2016-11-07 DIAGNOSIS — I4892 Unspecified atrial flutter: Secondary | ICD-10-CM

## 2016-11-07 DIAGNOSIS — N3000 Acute cystitis without hematuria: Secondary | ICD-10-CM

## 2016-11-07 LAB — CBC
HEMATOCRIT: 30.8 % — AB (ref 39.0–52.0)
HEMOGLOBIN: 10.2 g/dL — AB (ref 13.0–17.0)
MCH: 29 pg (ref 26.0–34.0)
MCHC: 33.1 g/dL (ref 30.0–36.0)
MCV: 87.5 fL (ref 78.0–100.0)
Platelets: 378 10*3/uL (ref 150–400)
RBC: 3.52 MIL/uL — AB (ref 4.22–5.81)
RDW: 12.7 % (ref 11.5–15.5)
WBC: 15 10*3/uL — AB (ref 4.0–10.5)

## 2016-11-07 LAB — GLUCOSE, CAPILLARY
GLUCOSE-CAPILLARY: 365 mg/dL — AB (ref 65–99)
GLUCOSE-CAPILLARY: 74 mg/dL (ref 65–99)
GLUCOSE-CAPILLARY: 104 mg/dL — AB (ref 65–99)
Glucose-Capillary: 174 mg/dL — ABNORMAL HIGH (ref 65–99)
Glucose-Capillary: 348 mg/dL — ABNORMAL HIGH (ref 65–99)

## 2016-11-07 LAB — CREATININE, SERUM
CREATININE: 1.36 mg/dL — AB (ref 0.61–1.24)
GFR calc Af Amer: >60 mL/min (ref >60)
GFR calc non Af Amer: >60 mL/min (ref >60)

## 2016-11-07 LAB — TSH: TSH: 0.525 u[IU]/mL (ref 0.350–4.500)

## 2016-11-07 LAB — URINE CULTURE

## 2016-11-07 MED ORDER — INSULIN ASPART 100 UNIT/ML ~~LOC~~ SOLN: 9.0000 [IU] | Freq: Three times a day (TID) | SUBCUTANEOUS | Status: DC

## 2016-11-07 MED ORDER — INSULIN ASPART 100 UNIT/ML ~~LOC~~ SOLN
10.0000 [IU] | Freq: Three times a day (TID) | SUBCUTANEOUS | Status: DC
  Administered 2016-11-07 – 2016-11-08 (×5): 10 [IU] via SUBCUTANEOUS

## 2016-11-07 MED ORDER — PIPERACILLIN-TAZOBACTAM 3.375 G IVPB
3.3750 g | Freq: Three times a day (TID) | INTRAVENOUS | Status: AC
  Administered 2016-11-07: 3.375 g via INTRAVENOUS
  Filled 2016-11-07: qty 50

## 2016-11-07 MED ORDER — DOXYCYCLINE HYCLATE 100 MG PO TABS
100.0000 mg | ORAL_TABLET | Freq: Two times a day (BID) | ORAL | Status: DC
  Administered 2016-11-08: 100 mg via ORAL
  Filled 2016-11-07: qty 1

## 2016-11-07 MED ORDER — INSULIN DETEMIR 100 UNIT/ML ~~LOC~~ SOLN
27.0000 [IU] | Freq: Two times a day (BID) | SUBCUTANEOUS | Status: DC
  Administered 2016-11-07 – 2016-11-08 (×2): 27 [IU] via SUBCUTANEOUS
  Filled 2016-11-07 (×3): qty 0.27

## 2016-11-07 NOTE — Progress Notes (Signed)
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 Pt ambulated an increased distance this session as compared to previous; however, still a very short distance (20' with RW and min A). Pt would continue to benefit from skilled physical therapy services at this time while admitted and after d/c to address the below listed limitations in order to improve overall safety and independence with functional mobility.    Follow Up Recommendations  SNF     Equipment Recommendations  Rolling walker with 5" wheels    Recommendations for Other Services       Precautions / Restrictions Precautions Precautions: Fall Restrictions Weight Bearing Restrictions: Yes LLE Weight Bearing: Non weight bearing    Mobility  Bed Mobility Overal bed mobility: Needs Assistance Bed Mobility: Supine to Sit     Supine to sit: Min guard     General bed mobility comments: increased time and effort, supervision for safety  Transfers Overall transfer level: Needs assistance Equipment used: Rolling walker (2 wheeled) Transfers: Sit to/from Stand Sit to Stand: Min guard         General transfer comment: increased time, cueing for technique, min guard for safety  Ambulation/Gait Ambulation/Gait assistance: Min assist Ambulation Distance (Feet): 20 Feet Assistive device: Rolling walker (2 wheeled) Gait Pattern/deviations: Step-to pattern (hop-to pattern on R LE) Gait velocity: decreased Gait velocity interpretation: Below normal  speed for age/gender General Gait Details: hop-to pattern on R LE; pt a bit impulsive especially with directional changes, requiring min A for stability and cueing for safety   Stairs            Wheelchair Mobility    Modified Rankin (Stroke Patients Only)       Balance Overall balance assessment: Needs assistance Sitting-balance support: No upper extremity supported Sitting balance-Leahy Scale: Good     Standing balance support: Single extremity supported Standing balance-Leahy Scale: Poor Standing balance comment: pt reliant on at least one UE support on RW                            Cognition Arousal/Alertness: Awake/alert Behavior During Therapy: [employer] for tasks assessed/performed;Impulsive Overall Cognitive Status: Within Functional Limits for tasks assessed                                        Exercises      General Comments        Pertinent Vitals/Pain Pain Assessment: 0-10 Pain Score: 5  Pain Location: L foot when dependent Pain Descriptors / Indicators: Throbbing Pain Intervention(s): Monitored during session;Repositioned    Home Living                      Prior Function            PT Goals (current goals can now be found in the care plan section) Acute Rehab PT Goals PT  Goal Formulation: With patient Time For Goal Achievement: 11/20/16 Potential to Achieve Goals: Good Progress towards PT goals: Progressing toward goals    Frequency    Min 4X/week      PT Plan Current plan remains appropriate    Co-evaluation              AM-PAC PT "6 Clicks" Daily Activity  Outcome Measure  Difficulty turning over in bed (including adjusting bedclothes, sheets and blankets)?: A Little Difficulty moving from lying on back to sitting on the side of the bed? : A Little Difficulty sitting down on and standing up from a chair with arms (e.g., wheelchair, bedside commode, etc,.)?: Unable Help needed moving  to and from a bed to chair (including a wheelchair)?: A Little Help needed walking in hospital room?: A Little Help needed climbing 3-5 steps with a railing? : A Little 6 Click Score: 16    End of Session Equipment Utilized During Treatment: Gait belt Activity Tolerance: Patient limited by pain;Patient limited by fatigue Patient left: in chair;with call bell/phone within reach Nurse Communication: Mobility status PT Visit Diagnosis: Other abnormalities of gait and mobility (R26.89)     Time: 5747-3403 PT Time Calculation (min) (ACUTE ONLY): 16 min  Charges:  $Gait Training: 8-22 mins                    G Codes:       [place], [place], [place] 11/07/2016, 4:13 PM

## 2016-11-07 NOTE — Progress Notes (Signed)
Subjective:  OR 8/20. Getting IV Abx x48hours (last dose today). Will discharge with 3 weeks doxycycline. PT saw him yesterday and recommended SNF for rehab. Patient prefers SNF. Mr. Nardozzi reports his toe does hurt, but the pain medication is helping. Currently afebrile.  He denies chest pain, shortness of breath, or palpitations. He does report that he has some trouble urinating. No dysuria.  Objective:  Vital signs in last 24 hours: Vitals:   11/06/16 0458 11/06/16 1404 11/06/16 2224 11/07/16 0650  BP: 115/72 126/68 129/80 120/74  Pulse: 94 (!) 101 94 84  Resp: 17 20 18 18   Temp: 99.1 F (37.3 C) 98.2 F (36.8 C) 99.4 F (37.4 C) 98 F (36.7 C)  TempSrc: Oral Oral Oral Oral  SpO2: 95% 98% 97% 95%  Weight:      Height:       GEN: Lying in bed resting comfortably in NAD. Alert and oriented. CV: Normal rate and regular rhythm. No murmurs, gallops, or rubs. EXT: Left foot bandaged and in an ortho shoe  Labs CBC Latest Ref Rng & Units 11/07/2016 11/04/2016 11/03/2016  WBC 4.0 - 10.5 K/uL 15.0(H) 24.0(H) 25.3(H)  Hemoglobin 13.0 - 17.0 g/dL 10.2(L) 10.6(L) 12.0(L)  Hematocrit 39.0 - 52.0 % 30.8(L) 30.4(L) 33.3(L)  Platelets 150 - 400 K/uL 378 316 359   BMP Latest Ref Rng & Units 11/07/2016 11/04/2016 11/03/2016  Glucose 65 - 99 mg/dL - 295(H) 380(H)  BUN 6 - 20 mg/dL - 7 9  Creatinine 0.61 - 1.24 mg/dL 1.36(H) 0.82 1.05  BUN/Creat Ratio 9 - 20 - - -  Sodium 135 - 145 mmol/L - 132(L) 133(L)  Potassium 3.5 - 5.1 mmol/L - 3.5 3.5  Chloride 101 - 111 mmol/L - 95(L) 94(L)  CO2 22 - 32 mmol/L - 25 28  Calcium 8.9 - 10.3 mg/dL - 8.7(L) 9.4   UCx positive for pan-sensitive Staph Aureus  Assessment/Plan:  Active Problems:   Osteomyelitis (HCC)   Cutaneous abscess of left foot  Mr. Conant is a 31yo male with PMH significant for poorly-controlled diabetes, HTN, HLD, and depression who presents with worsening left foot pain and great toe wound for the last 3 weeks with imaging  concerning for osteomyelitis, now s/p first ray amputation 8/20.  # Osteomyelitis of L great toe, with overlying cellulitis of L medial foot, now s/p first ray amputation 8/20. Currently afebrile. Left foot is bandaged with a clean dressing and in a shoe. With source control and broader antibiotics (vanc/zosyn), patient appears clinically improved, so we will continue vanc/zosyn. - OR 8/20 for first ray amputation - Continue vanc/zosyn for 48 hours (last dose today) - pain control: tylenol 650mg  PRN + oxy 5-10mg  PRN + dilaudid IV PRN - Will start PO doxycycline after vanc/zosyn  # Poorly controlled diabetes, likely type 2 Prescribed metformin, levemir 20 BID & novolog 20 BID at home BG in the high 170s-200s. A1c 11.0 - hold home metformin for now - increase to Levemir 27u BID + 10u novolog with meals + SSI (resistant) - CBG  New onset atrial flutter. HR in 90s-100s I did not appreciate a murmur on physical exam. Patient does state that he had a hole in his heart and is not sure what happened after that. Denies current chest pain or palpitations. CHADS2-VASc is at least 1, with 0.9% yearly risk of stroke/TIA. This puts him at low-moderate risk, and antiplatelet therapy or anticoagulation should be considered. TSH is normal. - repeat EKG - telemetry - TTE -  will address need for anticoagulation after TTE  # Chronic diarrhea Stable. - will continue to hold metformin in the hospital - will discharge while holding metformin prior to PCP follow-up to see whether metformin may be contributing - outpatient follow up with PCP  # Acute cystitis UA positive for infection. UCx positive for pan-sensitive Staph Aureus. Patient may have been bacteremic given abscess in his foot. And this UTI may have spread hematogenously. - On antibiotics for cellulitis, as above - The doxycycline he will go home will provide coverage for his UTI as well.  # Depression Expresses suicidal and homicidal  ideation but denies intent. Working through increased stress at home. Endorses increased sleepiness and decreased appetite - Continue home escitalopram 20mg   # HLD - continue home pravastatin 20mg   # FEN/GI - Diabetic diet  VTE PPx: Lovenox  Dispo: Anticipated discharge in approximately 1-2 days - PT recommending SNF and patient is amenable to SNF - Consult to social work for SNF placement  Colbert Ewing, MD  Internal Medicine, PGY-1 11/07/2016, 9:03 AM P [phone]

## 2016-11-07 NOTE — Clinical Social Work Note (Signed)
Clinical Social Work Assessment  Patient Details  Name: [name] [name] MRN: [id number] Date of Birth: [birth year]  Date of referral:  (P) 11/07/16               Reason for consult:  (P) Facility Placement, Financial Concerns, Insurance Barriers                Permission sought to share information with:  (P) Chartered certified accountant granted to share information::  (P) Yes, Verbal Permission Granted  Name::        Agency::  (P) [facility]  Relationship::     Contact Information:     Housing/Transportation Living arrangements for the past 2 months:  (P) Apartment Source of Information:  (P) Patient Patient Interpreter Needed:  (P) None Criminal Activity/Legal Involvement Pertinent to Current Situation/Hospitalization:  (P) No - Comment as needed Significant Relationships:  (P) Spouse Lives with:  (P) Self Do you feel safe going back to the place where you live?  (P) Yes Need for family participation in patient care:  (P) No (Coment)  Care giving concerns:  Pt has been living at apartment alone- his bed is on the first floor but bathroom is upstairs.  States it has been somewhat difficult to care for himself given weakness.  States this weakness has been present for years and has slowly been getting worse.    Social Worker assessment / plan:  CSW spoke with pt concerning Medicaid, disability, and SNF recommendations.  Pt states he is on 3rd application with disability and also has applied for Medicaid.  Pt has been denied for medicaid and disability- states this is because he is technically married despite fact that wife does not live with him anymore (still pays for the house).  Pt wife is on SSI and gets food stamps and pt states that because she gets those benefits their "dual" income disqualifies him for benefits.  Pt states he has not worked for 2 years and most jobs before that paid under the table.  States he has had issues with his diabetes for  past 10 years- is not consistently able to pay for medications due to lack of insurance.     CSW reviewed financial counseling notes and they have reviewed pt case- they will follow along in case pt gets diagnosis that would qualify him for SSI.  Pt also states that he needs help with transport- states he applied for SCAT with [facility] awhile ago but he never heard back- states using normal bus is difficult with mobility since it is through rough terrain from his apartment.   CSW spoke with pt about PT recommendation for SNF and that MD had informed CSW he was interested.  CSW explained barriers to placement due to patient lack of insurance and inquired about possible supports for home if pt is unable to be placed.  Pt states that his wife has told him she could come help with his care for awhile at discharge and that he also has lots of friends capable of helping.  States that if he had urinal/potty chair he could manage to not go up stairs since his bed is on the main level.   Employment status:  (P) Unemployed Insurance information:  (P) Self Pay (Medicaid Pending) PT Recommendations:  (P) [place] / Referral to community resources:  (P) [place]  Patient/Family's Response to care:  Pt is agreeable to any help CSW able to  provide and hopeful for SNF where he thinks he will get the best care.  Patient/Family's Understanding of and Emotional Response to Diagnosis, Current Treatment, and Prognosis: Unclear level of understanding- pt has attempted to get aid through community and seems to be motivated to do what is necessary but still with uncontrolled medical concerns- grateful for any assistance.  Emotional Assessment Appearance:  (P) Appears stated age, Disheveled Attitude/Demeanor/Rapport:    Affect (typically observed):  (P) Appropriate, Pleasant Orientation:  (P) Oriented to Situation, Oriented to  Time, Oriented to Place,  Oriented to Self Alcohol / Substance use:  (P) Not Applicable Psych involvement (Current and /or in the community):  (P) No (Comment)  Discharge Needs  Concerns to be addressed:  (P) Care Coordination Readmission within the last 30 days:  (P) No Current discharge risk:  (P) Physical Impairment Barriers to Discharge:  (P) Continued Medical Work up   [employer], LCSW 11/07/2016, 11:50 AM

## 2016-11-08 ENCOUNTER — Inpatient Hospital Stay (HOSPITAL_COMMUNITY): Payer: Self-pay

## 2016-11-08 DIAGNOSIS — N179 Acute kidney failure, unspecified: Secondary | ICD-10-CM

## 2016-11-08 DIAGNOSIS — Z79899 Other long term (current) drug therapy: Secondary | ICD-10-CM

## 2016-11-08 LAB — URINALYSIS, ROUTINE W REFLEX MICROSCOPIC
Bilirubin Urine: NEGATIVE
Glucose, UA: 150 mg/dL — AB
Hgb urine dipstick: NEGATIVE
KETONES UR: NEGATIVE mg/dL
LEUKOCYTES UA: NEGATIVE
NITRITE: NEGATIVE
PH: 7 (ref 5.0–8.0)
PROTEIN: NEGATIVE mg/dL
Specific Gravity, Urine: 1.004 — ABNORMAL LOW (ref 1.005–1.030)

## 2016-11-08 LAB — BASIC METABOLIC PANEL
ANION GAP: 11 (ref 5–15)
BUN: 11 mg/dL (ref 6–20)
CO2: 29 mmol/L (ref 22–32)
Calcium: 9.1 mg/dL (ref 8.9–10.3)
Chloride: 98 mmol/L — ABNORMAL LOW (ref 101–111)
Creatinine, Ser: 1.48 mg/dL — ABNORMAL HIGH (ref 0.61–1.24)
GFR calc Af Amer: >60 mL/min (ref >60)
GLUCOSE: 253 mg/dL — AB (ref 65–99)
POTASSIUM: 4.3 mmol/L (ref 3.5–5.1)
Sodium: 138 mmol/L (ref 135–145)

## 2016-11-08 LAB — CBC
HEMATOCRIT: 30.7 % — AB (ref 39.0–52.0)
HEMOGLOBIN: 10 g/dL — AB (ref 13.0–17.0)
MCH: 28.2 pg (ref 26.0–34.0)
MCHC: 32.6 g/dL (ref 30.0–36.0)
MCV: 86.7 fL (ref 78.0–100.0)
Platelets: 407 10*3/uL — ABNORMAL HIGH (ref 150–400)
RBC: 3.54 MIL/uL — AB (ref 4.22–5.81)
RDW: 12 % (ref 11.5–15.5)
WBC: 13.7 10*3/uL — ABNORMAL HIGH (ref 4.0–10.5)

## 2016-11-08 LAB — ECHOCARDIOGRAM COMPLETE
HEIGHTINCHES: 67 in
Weight: 3280 oz

## 2016-11-08 LAB — GLUCOSE, CAPILLARY
GLUCOSE-CAPILLARY: 159 mg/dL — AB (ref 65–99)
GLUCOSE-CAPILLARY: 261 mg/dL — AB (ref 65–99)
Glucose-Capillary: 276 mg/dL — ABNORMAL HIGH (ref 65–99)

## 2016-11-08 MED ORDER — OXYCODONE HCL 5 MG PO TABS: 5.0000 mg | ORAL_TABLET | Freq: Three times a day (TID) | ORAL | 0 refills | Status: AC | PRN

## 2016-11-08 MED ORDER — DOXYCYCLINE HYCLATE 100 MG PO TABS: 100.0000 mg | ORAL_TABLET | Freq: Two times a day (BID) | ORAL | 0 refills | Status: DC

## 2016-11-08 MED FILL — DOXYCYCLINE 100 MG TABLET: 100 | 21 days supply | Qty: 42 | Fill #0

## 2016-11-08 NOTE — Progress Notes (Addendum)
CSW staffed case with Medical Director. Patient is unable to qualify for a Letter of Guarantee and will need to discharge home.   [name] [name] Harbert Fitterer LCSWA [phone]

## 2016-11-08 NOTE — Progress Notes (Signed)
Inpatient Diabetes Program Recommendations  AACE/ADA: New Consensus Statement on Inpatient Glycemic Control (2015)  Target Ranges:  Prepandial:   less than 140 mg/dL      Peak postprandial:   less than 180 mg/dL (1-2 hours)      Critically ill patients:  140 - 180 mg/dL   Lab Results  Component Value Date   GLUCAP 276 (H) 11/08/2016   HGBA1C 11.0 (H) 11/04/2016    Review of Glycemic Control  Diabetes history: DM2 Outpatient Diabetes medications: Levemir 20 units bid, Novolog 15 units bid, metformin 500 mg bid Current orders for Inpatient glycemic control:   Concerned about pt being able to afford his diabetes medicines. Was on Novolin 70/30 insulin at one time, then switched to Levemir and Novolog at [facility]. Would like to be certain pt can afford his meds. Currently on Levemir 27 units bid and Novolog 10 units tidwc + 0-20 units tidwc.  Will continue to follow.  Thank you. Lorenda Peck, RD, LDN, CDE Inpatient Diabetes Coordinator [phone]

## 2016-11-08 NOTE — Progress Notes (Signed)
PT Cancellation Note  Patient Details Name: [name] [name] MRN: [id number] DOB: [birth year]   Cancelled Treatment:    Reason Eval/Treat Not Completed: Other (comment) Pt reports that he is discharging today. States that he does not feel like doing any therapy at this time as he is planning to leave between 4&5. PT will check back tomorrow if pt is not discharged today.    Scheryl Marten PT, DPT  [phone]  11/08/2016, 3:39 PM

## 2016-11-08 NOTE — Progress Notes (Signed)
  Echocardiogram 2D Echocardiogram has been performed.  Donata Clay 11/08/2016, 1:51 PM

## 2016-11-08 NOTE — Discharge Instructions (Addendum)
[name] [name],  Dennis Bast had a bone infection of your left foot that was likely related to your high blood sugars from diabetes. It's important to try to get better control of your blood sugars. - Please take 27 units of Levemir twice a day. Take 20 units of Novolog three times a day with meals. - Please do not take your metformin. - Please take the antibiotic (doxycycline) twice a day until September 13. - Please come to our clinic downstairs on Tuesday, August 28 at 9:45am to check your kidney function. - Please follow up with Dr. Sharol Given (the orthopedic surgeon) in 1 week. - Please follow up with your primary care doctor in 2 weeks.

## 2016-11-08 NOTE — Progress Notes (Addendum)
Nutrition Follow-up  DOCUMENTATION CODES:   Obesity unspecified  INTERVENTION:   -Continue Glucerna Shake po BID, each supplement provides 220 kcal and 10 grams of protein -Continue MVI daily  NUTRITION DIAGNOSIS:   Increased nutrient needs related to wound healing as evidenced by estimated needs.  Ongoing  GOAL:   Patient will meet greater than or equal to 90% of their needs  Progressing  MONITOR:   PO intake, Supplement acceptance, Labs, Weight trends, Skin, I & O's  REASON FOR ASSESSMENT:   Consult Wound healing  ASSESSMENT:   [name] [name] is a 31 year old man with a history of diabetes, hypertension, hyperlipidemia, obesity, and depression who presents with left great toe pain associated with a wound 3 weeks. He is found to have a osteomyelitis of the left great toe which likely started as a neuropathic ulcer. He also has an overlying cellulitis and systemic response.  S/p PROCEDURE on 11/05/16:  AMPUTATION LEFT GREAT TOE, first ray. Local tissue rearrangement for wound closure 9 x 3 cm.  Per MD notes, Metformin being held due to poor tolerance (diarrhea). Pt will be discharged home on insulin.   Pt was ordered Glucerna BID by MD on 11/05/16. Pt taking both Glucerna and MVI as ordered. Appetite remains good; po 50-100%.  Currently receiving 0-20 units insulin aspart TID with meals, 10 units insulin aspart TID with meals, and 257 units insulin determir BID for inpatient glycemic control.   Per CSW notes, pt does not qualify for SNF; will discharge home once medically stable.   Labs reviewed: CBGS: 104-276.   Diet Order:  Diet Carb Modified Fluid consistency: Thin; Room service appropriate? Yes  Skin:   (closed lt foot incision)  Last BM:  11/07/16  Height:   Ht Readings from Last 1 Encounters:  11/03/16 5\' 7"  (1.702 m)    Weight:   Wt Readings from Last 1 Encounters:  11/03/16 205 lb (93 kg)    Ideal Body Weight:  67.3 kg  BMI:  Body mass index is  32.11 kg/m.  Estimated Nutritional Needs:   Kcal:  1850-2050  Protein:  100-115 grams  Fluid:  > 1.8 L  EDUCATION NEEDS:   Education needs addressed  Jaylene Schrom A. Jimmye Norman, RD, LDN, CDE Pager: [phone] After hours Pager: [phone]

## 2016-11-08 NOTE — Care Management (Signed)
ED CM received call from RN on 5W concerning patient being discharged with awaiting a w/c and bedside commode for d/c home today. CM reviewed record and noted that patient does not have insurance, CM spoke and explained that this would be self-pay. Patient states, he did not have the funds but would see if he could manage with walker. Patient reminded the importance of attending his follow up appointment he verbalizes understanding teach back done denies any further questions or concerns.

## 2016-11-08 NOTE — Progress Notes (Signed)
Subjective:  No acute events overnight. Finished last dose of IV vanc/zosyn yesterday. Started doxycycline this morning, will continue for 3 weeks. Patient prefers SNF, but is aware that insurance may not cover this and is amenable to going home with portable commode if they do not.  Mr. Killgore reports that the pain medication is helping his foot pain. Currently afebrile. Denies chest pain, shortness of breath, or palpitations.  Objective:  Vital signs in last 24 hours: Vitals:   11/07/16 0650 11/07/16 1408 11/07/16 2206 11/08/16 0632  BP: 120/74 137/86 (!) 138/98 (!) 131/94  Pulse: 84 92 89 80  Resp: 18 18 18 16   Temp: 98 F (36.7 C) 97.8 F (36.6 C) 98.4 F (36.9 C) 98.2 F (36.8 C)  TempSrc: Oral  Oral Oral  SpO2: 95% 97% 97% 97%  Weight:      Height:       GEN: Lying in bed resting comfortably in NAD. Alert and oriented. CV: Normal rate and regular rhythm. No murmurs, gallops, or rubs. EXT: Left foot bandaged  Labs CBC Latest Ref Rng & Units 11/08/2016 11/07/2016 11/04/2016  WBC 4.0 - 10.5 K/uL 13.7(H) 15.0(H) 24.0(H)  Hemoglobin 13.0 - 17.0 g/dL 10.0(L) 10.2(L) 10.6(L)  Hematocrit 39.0 - 52.0 % 30.7(L) 30.8(L) 30.4(L)  Platelets 150 - 400 K/uL 407(H) 378 316   BMP Latest Ref Rng & Units 11/07/2016 11/04/2016 11/03/2016  Glucose 65 - 99 mg/dL - 295(H) 380(H)  BUN 6 - 20 mg/dL - 7 9  Creatinine 0.61 - 1.24 mg/dL 1.36(H) 0.82 1.05  BUN/Creat Ratio 9 - 20 - - -  Sodium 135 - 145 mmol/L - 132(L) 133(L)  Potassium 3.5 - 5.1 mmol/L - 3.5 3.5  Chloride 101 - 111 mmol/L - 95(L) 94(L)  CO2 22 - 32 mmol/L - 25 28  Calcium 8.9 - 10.3 mg/dL - 8.7(L) 9.4   Assessment/Plan:  Active Problems:   Osteomyelitis (HCC)   Cutaneous abscess of left foot   Acute cystitis with positive culture  Mr. Cavins is a 31yo male with PMH significant for poorly-controlled diabetes, HTN, HLD, and depression who presents with worsening left foot pain and great toe wound for the last 3 weeks with  imaging concerning for osteomyelitis, now s/p first ray amputation 8/20. Vanc/zosyn discontinued yesterday, will continue 3 weeks of doxycycline.  # Osteomyelitis of L great toe, with overlying cellulitis of L medial foot, now s/p first ray amputation 8/20. Currently afebrile. Left foot is bandaged with a clean dressing. Patient has clinically improved with source control and antibiotics. Completed last dose of vanc/zosyn yesterday. Doxycycline started this morning. - OR 8/20 for first ray amputation - PO doxycycline for 3 weeks - pain control: tylenol 650mg  PRN + oxy 5-10mg  PRN + dilaudid IV PRN - anticipated discharge to SNF or home today - follow up with Ortho in 1 week  # Poorly controlled diabetes, likely type 2 Prescribed metformin, levemir 20 BID & novolog 20 BID at home Fasting BGs in 70s-100s. Postprandial BGs have been elevated (in 300s). A1c 11.0 - hold home metformin for now - Levemir 27u BID + 10u novolog with meals + SSI (resistant) - CBG - will discharge on Levemir 27u BID + 20u TID with meals; no SSI  # Possible new onset atrial flutter. HR in 80s-90s Repeat EKG with NSR. Telemetry without acute events. Denies current chest pain or palpitations. We have discussed his prior EKG with a cardiologist and he is not convinced that patient has true atrial flutter.  May have been that the patient was moving or had a tremor during the EKG. Patient does state that he had a hole in his heart and is not sure what happened after that. CHADS2-VASc is at least 1, with 0.9% yearly risk of stroke/TIA. However, with the uncertain diagnosis of atrial flutter and equivocal CHADS2-VASc score, we will not start him on anticoagulation therapy at discharge. - discontinue telemetry - f/u TTE - no anticoagulation for now - follow up with PCP after discharge  # AKI Cr bumped from 0.82 -> 1.36. Possibly secondary to vanc/zosyn. - Continuing to hold metformin in the hospital due to diarrhea - Will  continue to hold metformin at discharge - Recheck BMP this afternoon - Follow up with PCP outpatient  # Chronic diarrhea Stable. - will continue to hold metformin in the hospital - will discharge while holding metformin prior to PCP follow-up to see whether metformin may be contributing - outpatient follow up with PCP  # Acute cystitis UA positive for infection. UCx positive for pan-sensitive Staph Aureus. Patient may have been bacteremic given abscess in his foot. And this UTI may have spread hematogenously. - On antibiotics for cellulitis, as above - The doxycycline he will go home will provide coverage for his UTI as well.  # Depression Expresses suicidal and homicidal ideation but denies intent. Working through increased stress at home. Endorses increased sleepiness and decreased appetite - Continue home escitalopram 20mg   # HLD - continue home pravastatin 20mg   # FEN/GI - Diabetic diet  VTE PPx: Lovenox  Dispo: Anticipated discharge in approximately 1-2 days - PT recommending SNF and patient is amenable to SNF; however insurance may not cover SNF - Appreciate social work assistance for SNF placement - If insurance does not cover SNF, patient is okay with going home with portable commode  Colbert Ewing, MD  Internal Medicine, PGY-1 11/08/2016, 11:01 AM P [phone]

## 2016-11-09 ENCOUNTER — Ambulatory Visit: Payer: Self-pay

## 2016-11-12 ENCOUNTER — Ambulatory Visit: Payer: Self-pay | Admitting: Internal Medicine

## 2016-11-13 ENCOUNTER — Ambulatory Visit: Payer: Self-pay

## 2016-11-15 ENCOUNTER — Ambulatory Visit (INDEPENDENT_AMBULATORY_CARE_PROVIDER_SITE_OTHER): Payer: Self-pay | Admitting: Family

## 2016-11-15 ENCOUNTER — Ambulatory Visit: Payer: Self-pay | Attending: Internal Medicine | Admitting: Internal Medicine

## 2016-11-15 ENCOUNTER — Encounter: Payer: Self-pay | Admitting: Internal Medicine

## 2016-11-15 VITALS — BP 100/65 | HR 111 | Temp 98.3°F | Resp 16 | Wt 199.8 lb

## 2016-11-15 DIAGNOSIS — D649 Anemia, unspecified: Secondary | ICD-10-CM | POA: Insufficient documentation

## 2016-11-15 DIAGNOSIS — E785 Hyperlipidemia, unspecified: Secondary | ICD-10-CM | POA: Insufficient documentation

## 2016-11-15 DIAGNOSIS — E1165 Type 2 diabetes mellitus with hyperglycemia: Secondary | ICD-10-CM

## 2016-11-15 DIAGNOSIS — L97529 Non-pressure chronic ulcer of other part of left foot with unspecified severity: Secondary | ICD-10-CM | POA: Insufficient documentation

## 2016-11-15 DIAGNOSIS — E1140 Type 2 diabetes mellitus with diabetic neuropathy, unspecified: Secondary | ICD-10-CM | POA: Insufficient documentation

## 2016-11-15 DIAGNOSIS — E1169 Type 2 diabetes mellitus with other specified complication: Secondary | ICD-10-CM | POA: Insufficient documentation

## 2016-11-15 DIAGNOSIS — L02612 Cutaneous abscess of left foot: Secondary | ICD-10-CM | POA: Insufficient documentation

## 2016-11-15 DIAGNOSIS — Z9119 Patient's noncompliance with other medical treatment and regimen: Secondary | ICD-10-CM | POA: Insufficient documentation

## 2016-11-15 DIAGNOSIS — J45909 Unspecified asthma, uncomplicated: Secondary | ICD-10-CM | POA: Insufficient documentation

## 2016-11-15 DIAGNOSIS — E118 Type 2 diabetes mellitus with unspecified complications: Secondary | ICD-10-CM

## 2016-11-15 DIAGNOSIS — I10 Essential (primary) hypertension: Secondary | ICD-10-CM | POA: Insufficient documentation

## 2016-11-15 DIAGNOSIS — Z89412 Acquired absence of left great toe: Secondary | ICD-10-CM | POA: Insufficient documentation

## 2016-11-15 DIAGNOSIS — N289 Disorder of kidney and ureter, unspecified: Secondary | ICD-10-CM | POA: Insufficient documentation

## 2016-11-15 DIAGNOSIS — F329 Major depressive disorder, single episode, unspecified: Secondary | ICD-10-CM | POA: Insufficient documentation

## 2016-11-15 DIAGNOSIS — M86172 Other acute osteomyelitis, left ankle and foot: Secondary | ICD-10-CM | POA: Insufficient documentation

## 2016-11-15 DIAGNOSIS — Z794 Long term (current) use of insulin: Secondary | ICD-10-CM

## 2016-11-15 DIAGNOSIS — T8130XA Disruption of wound, unspecified, initial encounter: Secondary | ICD-10-CM

## 2016-11-15 DIAGNOSIS — IMO0002 Reserved for concepts with insufficient information to code with codable children: Secondary | ICD-10-CM

## 2016-11-15 DIAGNOSIS — E11621 Type 2 diabetes mellitus with foot ulcer: Secondary | ICD-10-CM | POA: Insufficient documentation

## 2016-11-15 LAB — GLUCOSE, POCT (MANUAL RESULT ENTRY): POC Glucose: 248 mg/dl — AB (ref 70–99)

## 2016-11-15 MED FILL — LEVEMIR FLEXPEN 100 UNITS/M: 100 | 7 days supply | Qty: 3 | Fill #0

## 2016-11-15 MED FILL — NOVOLOG FLEXPEN SYRINGE: 100 | 15 days supply | Qty: 6 | Fill #1

## 2016-11-15 MED FILL — TRUE METRIX TEST STRIP: 30 days supply | Qty: 100 | Fill #1

## 2016-11-15 MED FILL — GENTAMICIN 0.1% CREAM: 0.1 | 15 days supply | Qty: 30 | Fill #0

## 2016-11-15 NOTE — Patient Instructions (Addendum)
Take NovoLog 20 units 3 times a day with meals. Take Levemir 27 units twice a day.  You have an appointment with the orthopedic specialist today at 3:30. Below is the name and address of the practice.  Newt Minion, MD   Specialty:  Orthopedic Surgery Contact information: [place] [address] [phone]

## 2016-11-15 NOTE — Progress Notes (Signed)
Patient ID: [name] [name], male    DOB: [birth year]  MRN: [id number]  CC: Hospitalization Follow-up   Subjective: [name] [name] is a 31 y.o. male who presents for hosp f/u. Wife is with him. She is on the phone with patient's mother who was on speaker phone during this visit. His concerns today include:  Hx of DM type 2 with neuropathy, HTN, HL, depression  1. Pt patient seen by me 10/29/2016 with concerning ulcer on the plantar surface of the left toe. -MRI was ordered. Patient prescribed Cipro and Augmentin. He never filled the prescriptions. -hospitalized from 8/18-23/2018 with osteomyelitis of the left big toe and underwent amputation. -Wound cultures negative. Treated with IV antibiotics and subsequently discharged with doxycycline plan. 3 weeks. -He has not picked up the doxycycline as yet  -Has a walker and bedside commode. Does not have walk with him today  -Missed hospital follow-up appointment with me 3 days ago due to lack of transportation -Dressing has not been changed to the left foot since discharge on 8/23. Patient states he was told just to follow-up with the orthopedic surgeon in 1 week and was not given any instructions about dressing changes [facility] course was complicated with renal insufficiency -Also had postop anemia  2. DM -From hospital Levemir was increased from 20-27 units twice a day. NovoLog 20 units TID with meals.  He has not been taking the Levemir because his pen does not work. Takes Novolog only at nights "because that's the only time I eat." -needs stripes for glucometer -Metformin d/c due to reported chronic diarrhea. Diarrhea has since stopped   Patient Active Problem List   Diagnosis Date Noted  . Acute cystitis with positive culture   . Cutaneous abscess of left foot   . Osteomyelitis ([place]) 11/03/2016  . Diabetic osteomyelitis ([place]) 10/27/2016  . Hyperlipidemia 10/17/2016  . Depression 10/17/2016  . Neck pain   . Asthma 04/11/2016  .  Uncontrolled type 2 diabetes mellitus with complication ([place])   . Obesity, morbid ([place]) 09/06/2015  . Hypertension 09/06/2015     Current Outpatient Prescriptions on File Prior to Visit  Medication Sig Dispense Refill  . acetaminophen (TYLENOL) 325 MG tablet Take 2 tablets (650 mg total) by mouth every 6 (six) hours as needed for mild pain or fever (for pain). 10 tablet 0  . doxycycline (VIBRA-TABS) 100 MG tablet Take 1 tablet (100 mg total) by mouth every 12 (twelve) hours. 42 tablet 0  . escitalopram (LEXAPRO) 20 MG tablet Take 1 tablet (20 mg total) by mouth at bedtime. 30 tablet 6  . gabapentin (NEURONTIN) 300 MG capsule Take 1 capsule (300 mg total) by mouth 3 (three) times daily. 90 capsule 6  . gentamicin cream (GARAMYCIN) 0.1 % Apply 1 application topically daily. (Patient not taking: Reported on 11/04/2016) 30 g 0  . insulin aspart (NOVOLOG FLEXPEN) 100 UNIT/ML FlexPen Inject 20 units 15 minutes before your two largest meals of the day (Patient taking differently: Inject 15 Units into the skin See admin instructions. Inject 20 units 15 minutes before your two largest meals of the day) 15 mL 3  . Insulin Detemir (LEVEMIR FLEXPEN) 100 UNIT/ML Pen Inject 20 Units into the skin 2 (two) times daily. 15 mL 3  . mupirocin cream (BACTROBAN) 2 % Apply 1 application topically 2 (two) times daily. (Patient taking differently: Apply 1 application topically 2 (two) times daily as needed (cuts and scrapes). ) 15 g 0  . oxyCODONE (OXY IR/ROXICODONE) 5 MG  immediate release tablet Take 1 tablet (5 mg total) by mouth every 8 (eight) hours as needed for breakthrough pain. 21 tablet 0  . pravastatin (PRAVACHOL) 20 MG tablet Take 1 tablet (20 mg total) by mouth daily. 90 tablet 3   No current facility-administered medications on file prior to visit.     No Known Allergies  Social History   Social History  . Marital status: Married    Spouse name: N/A  . Number of children: N/A  . Years of education:  N/A   Occupational History  . Not on file.   Social History Main Topics  . Smoking status: Never Smoker  . Smokeless tobacco: Never Used  . Alcohol use Yes     Comment: 11/06/2016 "I'll drink a little at parties; maybe 5 times/year"  . Drug use: Yes    Types: Marijuana     Comment: 11/06/2016 "stopped ~ 1 month ago"  . Sexual activity: No   Other Topics Concern  . Not on file   Social History Narrative  . No narrative on file    Family History  Problem Relation Age of Onset  . Diabetes type II Mother   . Diabetes type II Father     Past Surgical History:  Procedure Laterality Date  . AMPUTATION Left 11/05/2016   Procedure: AMPUTATION LEFT GREAT TOE;  Surgeon: Newt Minion, MD;  Location: [facility];  Service: Orthopedics;  Laterality: Left;  . DRESSING CHANGE UNDER ANESTHESIA N/A 04/13/2016   Procedure: DRESSING CHANGE UNDER ANESTHESIA;  Surgeon: Johnathan Hausen, MD;  Location: [facility];  Service: General;  Laterality: N/A;  . INCISION AND DRAINAGE ABSCESS Right 09/03/2015   Procedure: INCISION AND DRAINAGE ABSCESS;  Surgeon: Roseanne Kaufman, MD;  Location: [facility];  Service: Orthopedics;  Laterality: Right;  . INCISION AND DRAINAGE PERIRECTAL ABSCESS N/A 04/11/2016   Procedure: IRRIGATION AND DEBRIDEMENT RECTAL ABSCESS;  Surgeon: Johnathan Hausen, MD;  Location: [facility];  Service: General;  Laterality: N/A;  . [place]; 1989   x2   . IRRIGATION AND DEBRIDEMENT ABSCESS Left 08/31/2015   Procedure: IRRIGATION AND DEBRIDEMENT ABSCESS LEFT SHOULDER;  Surgeon: Alphonsa Overall, MD;  Location: [facility];  Service: General;  Laterality: Left;  . WOUND DEBRIDEMENT N/A 02/24/2016   Procedure: SACRAL WOUND DEBRIDEMENT;  Surgeon: Georganna Skeans, MD;  Location: [facility];  Service: General;  Laterality: N/A;    ROS: Review of Systems Negative except as stated above PHYSICAL EXAM: BP 100/65   Pulse (!) 111   Temp 98.3 F (36.8 C) (Oral)   Resp 16   Wt 199 lb 12.8 oz (90.6 kg)   SpO2  99%   BMI 31.29 kg/m   Physical Exam  General appearance - pt in NAD. Flat affect Skin: LT foot: dressing removed from LT foot. Dressing soaked. Small amounts of puss and wound edges widen distally  Mental status -   Results for orders placed or performed in visit on 11/15/16  POCT glucose (manual entry)  Result Value Ref Range   POC Glucose 248 (A) 70 - 99 mg/dl    ASSESSMENT AND PLAN: 1. Other acute osteomyelitis of left foot ([place]) 2. Dehiscence of wound -PC placed to ortho and informed of My concern of some dehiscence of the form. They gave appointment for patient to be seen at their office today at 3:30. Patient and wife informed of this. Encouraged him to keep that appointment. -Dressing change done here in the office. -Patient to fill  prescription for doxycycline today. - will have case worker follow-up with the patient. He needs a lot of support. Strong history of noncompliance and appears self neglect.  3. Uncontrolled type 2 diabetes mellitus with complication, with long-term current use of insulin ([place]) -Clinical pharmacists met with patient today and showed him how to use the Levemir pen. -Advised compliance with Levemir and NovoLog - POCT glucose (manual entry)  4. Anemia, unspecified type -Post surgical. Check CBC today. - CBC  5. Acute renal insufficiency - Basic Metabolic Panel    Patient was given the opportunity to ask questions.  Patient verbalized understanding of the plan and was able to repeat key elements of the plan.   Orders Placed This Encounter  Procedures  . Basic Metabolic Panel  . CBC  . POCT glucose (manual entry)     Requested Prescriptions    No prescriptions requested or ordered in this encounter    Return in about 2 weeks (around 11/29/2016).  Karle Plumber, MD, FACP

## 2016-11-16 ENCOUNTER — Ambulatory Visit (INDEPENDENT_AMBULATORY_CARE_PROVIDER_SITE_OTHER): Payer: Self-pay | Admitting: Family

## 2016-11-16 ENCOUNTER — Ambulatory Visit: Payer: Self-pay | Attending: Internal Medicine

## 2016-11-16 LAB — BASIC METABOLIC PANEL
BUN / CREAT RATIO: 13 (ref 9–20)
BUN: 16 mg/dL (ref 6–20)
CO2: 27 mmol/L (ref 20–29)
CREATININE: 1.26 mg/dL (ref 0.76–1.27)
Calcium: 10 mg/dL (ref 8.7–10.2)
Chloride: 94 mmol/L — ABNORMAL LOW (ref 96–106)
GFR calc Af Amer: 88 mL/min/{1.73_m2} (ref >59)
GFR, EST NON AFRICAN AMERICAN: 76 mL/min/{1.73_m2} (ref >59)
Glucose: 169 mg/dL — ABNORMAL HIGH (ref 65–99)
Potassium: 4.1 mmol/L (ref 3.5–5.2)
SODIUM: 140 mmol/L (ref 134–144)

## 2016-11-16 LAB — CBC
Hematocrit: 34.4 % — ABNORMAL LOW (ref 37.5–51.0)
Hemoglobin: 11.6 g/dL — ABNORMAL LOW (ref 13.0–17.7)
MCH: 28.9 pg (ref 26.6–33.0)
MCHC: 33.7 g/dL (ref 31.5–35.7)
MCV: 86 fL (ref 79–97)
PLATELETS: 500 10*3/uL — AB (ref 150–379)
RBC: 4.01 x10E6/uL — AB (ref 4.14–5.80)
RDW: 13.8 % (ref 12.3–15.4)
WBC: 12.7 10*3/uL — ABNORMAL HIGH (ref 3.4–10.8)

## 2016-11-20 ENCOUNTER — Telehealth: Payer: Self-pay

## 2016-11-20 NOTE — Telephone Encounter (Signed)
Call placed to check on the patient at the request of Dr Wynetta Emery. His wife, Moises Terpstra,  answered and said that she was not with him at the time of the call and there was no other number to contact him. DPR on file to speak to Mrs. Charlson.   She explained that he has all of his medications and has been taking them as ordered but has been feeling dizzy every day and has been falling down and has occasionally hit his head. She said blood sugars have been 200's-300's. She also reported that he has been vomiting for " a couple of days" and has not been able to keep any food down. He does not use an assistive device when ambulating.   She stated that they did not have transportation to the orthopedic appointment on 11/16/16 and will need to reschedule. She noted that transportation can be a problem as walking to the bus stop for him can be too difficult at times. Discussed the option of applying for SCAT which can be done at the next appointment at [facility]. Ms Leder also noted that the dressing has not been changed on his left foot.   He currently has no income and has applied for disability. He has been denied medicaid in the past and met with the [facility] Financial Counselor 11/16/16 to apply for the [employer]. Ms Tolen stated that he needs to go to the [facility] to obtain documentation that he is homeless for  the [facility] application.   Discussed the SW services available at [facility] and encouraged her to contact [facility]. Also encouraged her to share this information with her husband. She was very appreciative of this contact information.   Update provided to Dr Wynetta Emery. She requested that the patient be scheduled for a 30 minute appointment on 11/22/16 and an appointment was scheduled for 1115.  Ms. Magwood notified of the appointment.

## 2016-11-21 ENCOUNTER — Telehealth: Payer: Self-pay | Admitting: Internal Medicine

## 2016-11-21 NOTE — Telephone Encounter (Signed)
Attempted to reach pt today via phone after discussing case with CW. Got VM with male's voice.  I did not leave a message. Pt scheduled to be seen tomorrow.

## 2016-11-22 ENCOUNTER — Encounter: Payer: Self-pay | Admitting: Internal Medicine

## 2016-11-22 ENCOUNTER — Encounter (HOSPITAL_COMMUNITY): Payer: Self-pay | Admitting: *Deleted

## 2016-11-22 ENCOUNTER — Inpatient Hospital Stay (HOSPITAL_COMMUNITY)
Admission: EM | Admit: 2016-11-22 | Discharge: 2016-11-26 | DRG: 863 | Disposition: A | Discharge status: Home Health | Payer: Self-pay | Attending: Internal Medicine | Admitting: Internal Medicine

## 2016-11-22 ENCOUNTER — Telehealth: Payer: Self-pay

## 2016-11-22 ENCOUNTER — Emergency Department (HOSPITAL_COMMUNITY): Payer: Self-pay

## 2016-11-22 ENCOUNTER — Ambulatory Visit: Payer: Self-pay | Attending: Internal Medicine | Admitting: Internal Medicine

## 2016-11-22 VITALS — BP 114/79 | HR 106 | Temp 98.7°F | Resp 18 | Ht 67.0 in | Wt 210.6 lb

## 2016-11-22 DIAGNOSIS — F329 Major depressive disorder, single episode, unspecified: Secondary | ICD-10-CM | POA: Insufficient documentation

## 2016-11-22 DIAGNOSIS — M869 Osteomyelitis, unspecified: Secondary | ICD-10-CM

## 2016-11-22 DIAGNOSIS — T148XXA Other injury of unspecified body region, initial encounter: Secondary | ICD-10-CM

## 2016-11-22 DIAGNOSIS — F319 Bipolar disorder, unspecified: Secondary | ICD-10-CM | POA: Diagnosis present

## 2016-11-22 DIAGNOSIS — IMO0002 Reserved for concepts with insufficient information to code with codable children: Secondary | ICD-10-CM

## 2016-11-22 DIAGNOSIS — L03116 Cellulitis of left lower limb: Secondary | ICD-10-CM | POA: Diagnosis present

## 2016-11-22 DIAGNOSIS — Z833 Family history of diabetes mellitus: Secondary | ICD-10-CM

## 2016-11-22 DIAGNOSIS — E785 Hyperlipidemia, unspecified: Secondary | ICD-10-CM | POA: Diagnosis present

## 2016-11-22 DIAGNOSIS — Z794 Long term (current) use of insulin: Secondary | ICD-10-CM

## 2016-11-22 DIAGNOSIS — IMO0001 Reserved for inherently not codable concepts without codable children: Secondary | ICD-10-CM

## 2016-11-22 DIAGNOSIS — E1165 Type 2 diabetes mellitus with hyperglycemia: Secondary | ICD-10-CM | POA: Diagnosis present

## 2016-11-22 DIAGNOSIS — E1142 Type 2 diabetes mellitus with diabetic polyneuropathy: Secondary | ICD-10-CM | POA: Diagnosis present

## 2016-11-22 DIAGNOSIS — Z6832 Body mass index (BMI) 32.0-32.9, adult: Secondary | ICD-10-CM | POA: Insufficient documentation

## 2016-11-22 DIAGNOSIS — S92325A Nondisplaced fracture of second metatarsal bone, left foot, initial encounter for closed fracture: Secondary | ICD-10-CM

## 2016-11-22 DIAGNOSIS — Z91199 Patient's noncompliance with other medical treatment and regimen due to unspecified reason: Secondary | ICD-10-CM

## 2016-11-22 DIAGNOSIS — M1990 Unspecified osteoarthritis, unspecified site: Secondary | ICD-10-CM | POA: Diagnosis present

## 2016-11-22 DIAGNOSIS — M86172 Other acute osteomyelitis, left ankle and foot: Secondary | ICD-10-CM | POA: Insufficient documentation

## 2016-11-22 DIAGNOSIS — Z9114 Patient's other noncompliance with medication regimen: Secondary | ICD-10-CM

## 2016-11-22 DIAGNOSIS — T814XXA Infection following a procedure, initial encounter: Principal | ICD-10-CM | POA: Diagnosis present

## 2016-11-22 DIAGNOSIS — E118 Type 2 diabetes mellitus with unspecified complications: Secondary | ICD-10-CM

## 2016-11-22 DIAGNOSIS — I10 Essential (primary) hypertension: Secondary | ICD-10-CM | POA: Insufficient documentation

## 2016-11-22 DIAGNOSIS — Z89412 Acquired absence of left great toe: Secondary | ICD-10-CM | POA: Insufficient documentation

## 2016-11-22 DIAGNOSIS — E1169 Type 2 diabetes mellitus with other specified complication: Secondary | ICD-10-CM

## 2016-11-22 DIAGNOSIS — Z6833 Body mass index (BMI) 33.0-33.9, adult: Secondary | ICD-10-CM

## 2016-11-22 DIAGNOSIS — T8189XA Other complications of procedures, not elsewhere classified, initial encounter: Secondary | ICD-10-CM

## 2016-11-22 DIAGNOSIS — Z9119 Patient's noncompliance with other medical treatment and regimen: Secondary | ICD-10-CM | POA: Insufficient documentation

## 2016-11-22 DIAGNOSIS — Z79899 Other long term (current) drug therapy: Secondary | ICD-10-CM | POA: Insufficient documentation

## 2016-11-22 DIAGNOSIS — S92323A Displaced fracture of second metatarsal bone, unspecified foot, initial encounter for closed fracture: Secondary | ICD-10-CM

## 2016-11-22 DIAGNOSIS — L089 Local infection of the skin and subcutaneous tissue, unspecified: Secondary | ICD-10-CM

## 2016-11-22 DIAGNOSIS — M84475A Pathological fracture, left foot, initial encounter for fracture: Secondary | ICD-10-CM | POA: Diagnosis present

## 2016-11-22 DIAGNOSIS — L02612 Cutaneous abscess of left foot: Secondary | ICD-10-CM

## 2016-11-22 DIAGNOSIS — E1140 Type 2 diabetes mellitus with diabetic neuropathy, unspecified: Secondary | ICD-10-CM

## 2016-11-22 DIAGNOSIS — S92333A Displaced fracture of third metatarsal bone, unspecified foot, initial encounter for closed fracture: Secondary | ICD-10-CM

## 2016-11-22 DIAGNOSIS — J45909 Unspecified asthma, uncomplicated: Secondary | ICD-10-CM | POA: Insufficient documentation

## 2016-11-22 LAB — CBC WITH DIFFERENTIAL/PLATELET
BASOS ABS: 0.1 10*3/uL (ref 0.0–0.1)
BASOS PCT: 0 %
EOS ABS: 0.3 10*3/uL (ref 0.0–0.7)
Eosinophils Relative: 2 %
HCT: 34.3 % — ABNORMAL LOW (ref 39.0–52.0)
Hemoglobin: 11.3 g/dL — ABNORMAL LOW (ref 13.0–17.0)
Lymphocytes Relative: 24 %
Lymphs Abs: 2.7 10*3/uL (ref 0.7–4.0)
MCH: 28.2 pg (ref 26.0–34.0)
MCHC: 32.9 g/dL (ref 30.0–36.0)
MCV: 85.5 fL (ref 78.0–100.0)
MONOS PCT: 6 %
Monocytes Absolute: 0.7 10*3/uL (ref 0.1–1.0)
NEUTROS PCT: 68 %
Neutro Abs: 7.8 10*3/uL — ABNORMAL HIGH (ref 1.7–7.7)
Platelets: 342 10*3/uL (ref 150–400)
RBC: 4.01 MIL/uL — ABNORMAL LOW (ref 4.22–5.81)
RDW: 12.8 % (ref 11.5–15.5)
WBC: 11.5 10*3/uL — AB (ref 4.0–10.5)

## 2016-11-22 LAB — GLUCOSE, CAPILLARY: Glucose-Capillary: 317 mg/dL — ABNORMAL HIGH (ref 65–99)

## 2016-11-22 LAB — COMPREHENSIVE METABOLIC PANEL
ALK PHOS: 106 U/L (ref 38–126)
ALT: 14 U/L — ABNORMAL LOW (ref 17–63)
ANION GAP: 10 (ref 5–15)
AST: 16 U/L (ref 15–41)
Albumin: 3.3 g/dL — ABNORMAL LOW (ref 3.5–5.0)
BILIRUBIN TOTAL: 0.5 mg/dL (ref 0.3–1.2)
BUN: 15 mg/dL (ref 6–20)
CALCIUM: 9 mg/dL (ref 8.9–10.3)
CO2: 22 mmol/L (ref 22–32)
CREATININE: 1.02 mg/dL (ref 0.61–1.24)
Chloride: 106 mmol/L (ref 101–111)
Glucose, Bld: 238 mg/dL — ABNORMAL HIGH (ref 65–99)
Potassium: 3.4 mmol/L — ABNORMAL LOW (ref 3.5–5.1)
Sodium: 138 mmol/L (ref 135–145)
TOTAL PROTEIN: 7.1 g/dL (ref 6.5–8.1)

## 2016-11-22 LAB — I-STAT CG4 LACTIC ACID, ED: Lactic Acid, Venous: 3.01 mmol/L (ref 0.5–1.9)

## 2016-11-22 LAB — C-REACTIVE PROTEIN: CRP: <0.8 mg/dL (ref <1.0)

## 2016-11-22 LAB — SEDIMENTATION RATE: SED RATE: 50 mm/h — AB (ref 0–16)

## 2016-11-22 LAB — PREALBUMIN: PREALBUMIN: 29.1 mg/dL (ref 18–38)

## 2016-11-22 MED ORDER — ESCITALOPRAM OXALATE 10 MG PO TABS
20.0000 mg | ORAL_TABLET | Freq: Every day | ORAL | Status: DC
  Administered 2016-11-22 – 2016-11-25 (×4): 20 mg via ORAL
  Filled 2016-11-22 (×4): qty 2

## 2016-11-22 MED ORDER — VANCOMYCIN HCL IN DEXTROSE 1-5 GM/200ML-% IV SOLN
1000.0000 mg | Freq: Once | INTRAVENOUS | Status: AC
  Administered 2016-11-22: 1000 mg via INTRAVENOUS
  Filled 2016-11-22: qty 200

## 2016-11-22 MED ORDER — PIPERACILLIN-TAZOBACTAM 3.375 G IVPB 30 MIN
3.3750 g | Freq: Once | INTRAVENOUS | Status: AC
  Administered 2016-11-22: 3.375 g via INTRAVENOUS
  Filled 2016-11-22: qty 50

## 2016-11-22 MED ORDER — INSULIN DETEMIR 100 UNIT/ML ~~LOC~~ SOLN
30.0000 [IU] | Freq: Every day | SUBCUTANEOUS | Status: DC
  Administered 2016-11-22: 30 [IU] via SUBCUTANEOUS
  Filled 2016-11-22: qty 0.3

## 2016-11-22 MED ORDER — ONDANSETRON HCL 4 MG/2ML IJ SOLN: 4.0000 mg | Freq: Four times a day (QID) | INTRAMUSCULAR | Status: DC | PRN

## 2016-11-22 MED ORDER — SODIUM CHLORIDE 0.9 % IV BOLUS (SEPSIS)
1000.0000 mL | Freq: Once | INTRAVENOUS | Status: AC
  Administered 2016-11-22: 1000 mL via INTRAVENOUS

## 2016-11-22 MED ORDER — INSULIN ASPART 100 UNIT/ML FLEXPEN: PEN_INJECTOR | SUBCUTANEOUS | 3 refills | Status: DC

## 2016-11-22 MED ORDER — ONDANSETRON HCL 4 MG PO TABS: 4.0000 mg | ORAL_TABLET | Freq: Four times a day (QID) | ORAL | Status: DC | PRN

## 2016-11-22 MED ORDER — INSULIN ASPART 100 UNIT/ML ~~LOC~~ SOLN: 6.0000 [IU] | Freq: Three times a day (TID) | SUBCUTANEOUS | Status: DC

## 2016-11-22 MED ORDER — VANCOMYCIN HCL IN DEXTROSE 1-5 GM/200ML-% IV SOLN
1000.0000 mg | Freq: Three times a day (TID) | INTRAVENOUS | Status: DC
  Administered 2016-11-22 – 2016-11-24 (×5): 1000 mg via INTRAVENOUS
  Filled 2016-11-22 (×6): qty 200

## 2016-11-22 MED ORDER — PIPERACILLIN-TAZOBACTAM 3.375 G IVPB
3.3750 g | Freq: Three times a day (TID) | INTRAVENOUS | Status: DC
  Administered 2016-11-22 – 2016-11-26 (×11): 3.375 g via INTRAVENOUS
  Filled 2016-11-22 (×12): qty 50

## 2016-11-22 MED ORDER — ACETAMINOPHEN 325 MG PO TABS
650.0000 mg | ORAL_TABLET | Freq: Four times a day (QID) | ORAL | Status: DC | PRN
Start: 2016-11-22 — End: 2016-11-26
  Administered 2016-11-22: 650 mg via ORAL
  Filled 2016-11-22: qty 2

## 2016-11-22 MED ORDER — PRAVASTATIN SODIUM 40 MG PO TABS
20.0000 mg | ORAL_TABLET | Freq: Every day | ORAL | Status: DC
  Administered 2016-11-23 – 2016-11-26 (×4): 20 mg via ORAL
  Filled 2016-11-22 (×4): qty 1

## 2016-11-22 MED ORDER — INSULIN ASPART 100 UNIT/ML ~~LOC~~ SOLN
0.0000 [IU] | Freq: Three times a day (TID) | SUBCUTANEOUS | Status: DC
  Administered 2016-11-23: 15 [IU] via SUBCUTANEOUS
  Administered 2016-11-23: 5 [IU] via SUBCUTANEOUS
  Administered 2016-11-24: 3 [IU] via SUBCUTANEOUS
  Administered 2016-11-24: 2 [IU] via SUBCUTANEOUS
  Administered 2016-11-25: 8 [IU] via SUBCUTANEOUS
  Administered 2016-11-25: 3 [IU] via SUBCUTANEOUS
  Administered 2016-11-25: 2 [IU] via SUBCUTANEOUS
  Administered 2016-11-26: 5 [IU] via SUBCUTANEOUS

## 2016-11-22 MED ORDER — INSULIN ASPART 100 UNIT/ML ~~LOC~~ SOLN: 20.0000 [IU] | Freq: Once | SUBCUTANEOUS | Status: DC

## 2016-11-22 MED ORDER — GABAPENTIN 300 MG PO CAPS
300.0000 mg | ORAL_CAPSULE | Freq: Three times a day (TID) | ORAL | Status: DC
  Administered 2016-11-22 – 2016-11-26 (×11): 300 mg via ORAL
  Filled 2016-11-22 (×11): qty 1

## 2016-11-22 MED ORDER — ACETAMINOPHEN 650 MG RE SUPP
650.0000 mg | Freq: Four times a day (QID) | RECTAL | Status: DC | PRN
Start: 2016-11-22 — End: 2016-11-26

## 2016-11-22 MED ORDER — INSULIN ASPART 100 UNIT/ML ~~LOC~~ SOLN
0.0000 [IU] | Freq: Every day | SUBCUTANEOUS | Status: DC
  Administered 2016-11-22: 4 [IU] via SUBCUTANEOUS
  Administered 2016-11-25: 3 [IU] via SUBCUTANEOUS

## 2016-11-22 MED ORDER — GABAPENTIN 300 MG PO CAPS: 300.0000 mg | ORAL_CAPSULE | Freq: Three times a day (TID) | ORAL | 6 refills | Status: DC

## 2016-11-22 MED ORDER — ENOXAPARIN SODIUM 40 MG/0.4ML ~~LOC~~ SOLN
40.0000 mg | SUBCUTANEOUS | Status: DC
  Administered 2016-11-22 – 2016-11-25 (×4): 40 mg via SUBCUTANEOUS
  Filled 2016-11-22 (×4): qty 0.4

## 2016-11-22 MED ORDER — INSULIN DETEMIR 100 UNIT/ML FLEXPEN: 30.0000 [IU] | PEN_INJECTOR | Freq: Two times a day (BID) | SUBCUTANEOUS | 3 refills | Status: DC

## 2016-11-22 NOTE — Patient Instructions (Signed)
Increase Levemir to 30 units twice a day.  Continue taking Novolog 20 units with meals.    Please be seen in ER today for your left foot.

## 2016-11-22 NOTE — ED Triage Notes (Signed)
Pt states that he had his left big to amputated 3 weeks ago. Pt was sent here for eval of possible infection. Pt reports swelling, reddness and some drainage.

## 2016-11-22 NOTE — [place] Notes (Signed)
Patient ID: [name] [name], male    DOB: 1985-08-14  MRN: [id number]  CC: Follow-up   Subjective: [name] [name] is a 31 y.o. male who presents for f/u. Wife is with him. His concerns today include:   Osteomyelitis LT foot:  -hospitalized from 8/18-23/2018 with osteomyelitis of the left big toe and underwent amputation. -Wound cultures negative. Treated with IV antibiotics and subsequently discharged with doxycycline plan for 3 weeks course. Seen by me last wk.  Concern for wound dehiscence. We contacted ortho who agreed to see him that day but pt rescheduled for the following day due to lack of transportation.  -subsequently no-showed the appt   -Taking Doxycycline once a day instead of BID "just to spread it out."  -doing dressing change once a day He has been applying wgh -reports minimal drainage.   DM both Levemir and Novolog BID.  "I only eat twice a day." -checking BS once a day -range b/w 300-400 -N/V better.  Last episode 3 days ago. Some dizzy spells.  Patient Active Problem List   Diagnosis Date Noted  . Acute cystitis with positive culture   . Cutaneous abscess of left foot   . Osteomyelitis ([name]) 11/03/2016  . Diabetic osteomyelitis ([name]) 10/27/2016  . Hyperlipidemia 10/17/2016  . Depression 10/17/2016  . Neck pain   . Asthma 04/11/2016  . Uncontrolled type 2 diabetes mellitus with complication ([name])   . Obesity, morbid ([name]) 09/06/2015  . Hypertension 09/06/2015     Current Outpatient Prescriptions on File Prior to Visit  Medication Sig Dispense Refill  . acetaminophen (TYLENOL) 325 MG tablet Take 2 tablets (650 mg total) by mouth every 6 (six) hours as needed for mild pain or fever (for pain). 10 tablet 0  . doxycycline (VIBRA-TABS) 100 MG tablet Take 1 tablet (100 mg total) by mouth every 12 (twelve) hours. 42 tablet 0  . escitalopram (LEXAPRO) 20 MG tablet Take 1 tablet (20 mg total) by mouth at bedtime. 30 tablet 6  . gentamicin cream (GARAMYCIN) 0.1 %  Apply 1 application topically daily. (Patient not taking: Reported on 11/04/2016) 30 g 0  . mupirocin cream (BACTROBAN) 2 % Apply 1 application topically 2 (two) times daily. (Patient taking differently: Apply 1 application topically 2 (two) times daily as needed (cuts and scrapes). ) 15 g 0  . pravastatin (PRAVACHOL) 20 MG tablet Take 1 tablet (20 mg total) by mouth daily. 90 tablet 3   No current facility-administered medications on file prior to visit.     No Known Allergies  Social History   Social History  . Marital status: Married    Spouse name: N/A  . Number of children: N/A  . Years of education: N/A   Occupational History  . Not on file.   Social History Main Topics  . Smoking status: Never Smoker  . Smokeless tobacco: Never Used  . Alcohol use Yes     Comment: 11/06/2016 "I'll drink a little at parties; maybe 5 times/year"  . Drug use: Yes    Types: Marijuana     Comment: 11/06/2016 "stopped ~ 1 month ago"  . Sexual activity: No   Other Topics Concern  . Not on file   Social History Narrative  . No narrative on file    Family History  Problem Relation Age of Onset  . Diabetes type II Mother   . Diabetes type II Father     Past Surgical History:  Procedure Laterality Date  . AMPUTATION Left 11/05/2016  Procedure: AMPUTATION LEFT GREAT TOE;  Surgeon: Newt Minion, MD;  Location: [place];  Service: Orthopedics;  Laterality: Left;  . DRESSING CHANGE UNDER ANESTHESIA N/A 04/13/2016   Procedure: DRESSING CHANGE UNDER ANESTHESIA;  Surgeon: Johnathan Hausen, MD;  Location: [facility];  Service: General;  Laterality: N/A;  . INCISION AND DRAINAGE ABSCESS Right 09/03/2015   Procedure: INCISION AND DRAINAGE ABSCESS;  Surgeon: Roseanne Kaufman, MD;  Location: [facility];  Service: Orthopedics;  Laterality: Right;  . INCISION AND DRAINAGE PERIRECTAL ABSCESS N/A 04/11/2016   Procedure: IRRIGATION AND DEBRIDEMENT RECTAL ABSCESS;  Surgeon: Johnathan Hausen, MD;  Location: [facility];  Service:  General;  Laterality: N/A;  . [place]; 1989   x2   . IRRIGATION AND DEBRIDEMENT ABSCESS Left 08/31/2015   Procedure: IRRIGATION AND DEBRIDEMENT ABSCESS LEFT SHOULDER;  Surgeon: Alphonsa Overall, MD;  Location: [facility];  Service: General;  Laterality: Left;  . WOUND DEBRIDEMENT N/A 02/24/2016   Procedure: SACRAL WOUND DEBRIDEMENT;  Surgeon: Georganna Skeans, MD;  Location: [place];  Service: General;  Laterality: N/A;    ROS: Review of Systems Neg except as stated above PHYSICAL EXAM: BP 114/79 (BP Location: Right Arm, Patient Position: Sitting, Cuff Size: Normal)   Pulse (!) 106   Temp 98.7 F (37.1 C) (Oral)   Resp 18   Ht 5\' 7"  (1.702 m)   Wt 210 lb 9.6 oz (95.5 kg)   SpO2 99%   BMI 32.98 kg/m   Physical Exam General appearance - alert, well appearing, young caucasian male and in no distress Mental status - alert, oriented to person, place, and time, normal mood, behavior, speech, dress, motor activity, and thought processes Chest - clear to auscultation, no wheezes, rales or rhonchi, symmetric air entry Heart - normal rate, regular rhythm, normal S1, S2, no murmurs, rubs, clicks or gallops Skin - LT foot: moderate erythema, edema  and skin peeling around surgical wound. Proximal edge of wound soft with mild drainage. Pt has dec  ASSESSMENT AND PLAN: 1. Other acute osteomyelitis of left foot ([place]) -pt sent to ER for cellulitis at best, spread of osteomyelitis at worse -I have spoken with charge nurse at [facility] ER  2. Uncontrolled type 2 diabetes mellitus with complication, with long-term current use of insulin ([place]) Given 20 of Novolog today in office [facility] Levemir to 30 units BID - insulin aspart (NOVOLOG FLEXPEN) 100 UNIT/ML FlexPen; Inject 20 units 15 minutes before your two largest meals of the day  Dispense: 15 mL; Refill: 3 - Insulin Detemir (LEVEMIR FLEXPEN) 100 UNIT/ML Pen; Inject 30 Units into the skin 2 (two) times daily.  Dispense: 15 mL; Refill: 3 - insulin  aspart (novoLOG) injection 20 Units; Inject 0.2 mLs (20 Units total) into the skin once.  3. Diabetic polyneuropathy associated with type 2 diabetes mellitus (HCC) - gabapentin (NEURONTIN) 300 MG capsule; Take 1 capsule (300 mg total) by mouth 3 (three) times daily.  Dispense: 90 capsule; Refill: 6  4. Medically noncompliant -pt has a lot of social issues affecting his health including lack of transportation, marital discord etc  Patient was given the opportunity to ask questions.  Patient verbalized understanding of the plan and was able to repeat key elements of the plan.   No orders of the defined types were placed in this encounter.    Requested Prescriptions   Signed Prescriptions Disp Refills  . insulin aspart (NOVOLOG FLEXPEN) 100 UNIT/ML FlexPen 15 mL 3    Sig: Inject 20 units  15 minutes before your two largest meals of the day  . Insulin Detemir (LEVEMIR FLEXPEN) 100 UNIT/ML Pen 15 mL 3    Sig: Inject 30 Units into the skin 2 (two) times daily.  Marland Kitchen gabapentin (NEURONTIN) 300 MG capsule 90 capsule 6    Sig: Take 1 capsule (300 mg total) by mouth 3 (three) times daily.    Return in about 2 weeks (around 12/06/2016).  Karle Plumber, MD, FACP

## 2016-11-22 NOTE — H&P (Signed)
Date: 11/22/2016               Patient Name:  [name] [name] MRN: [id number]  DOB: 28-Feb-1986 Age / Sex: 31 y.o., male   PCP: Ladell Pier, MD         Medical Service: Internal Medicine Teaching Service         Attending Physician: Dr. Lucious Groves, DO    First Contact: Dr. Berline Lopes Pager: [phone]  Second Contact: Dr. Reesa Chew Pager: [phone]       After Hours (After 5p/  First Contact Pager: [phone]  weekends / holidays): Second Contact Pager: [phone]   Chief Complaint: "I was told to come in to have my foot looked at"  History of Present Illness: [name] [name] is a 31 year old male who presented to the [facility] ED and on the recommendation of his physician. 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 Patient stated that he had left great toe amputation approximately 3 weeks prior. At that time he was told to stay off of his foot, but as he stays at home by himself majority of the day this was not possible. Patient states that in addition to this he has been taking only 1 tablet daily of this antibiotic that was prescribed to be taken twice daily. He stated they typically does this to prolong the medication but realized that this was a mistake. Patient does not currently have medication for his bipolar disorder, his asthma, and only recently began treatment for his diabetes. Patient is currently on escitalopram for his depression, insulin for his diabetes, gabapentin for the peripheral neuropathy, and doxycycline 100 mg every 12 hours at home.  Patient denied nausea, vomiting, headache, visual changes, chest pain, abdominal pain, muscle pain, fever, chills, diarrhea, constipation, shortness of breath, or any other symptoms that concern the patient. Patient denies pain in his left  great toe area or discharge at this time.  In the ED patient was evaluated with UA, CMP, CBC, epigastric, blood cultures were taken, sedimentation rate and C-reactive protein were recorded as well. The patient was noted to have a mild leukocytosis of 11.5, an anemia of 11.3, lactic acid of 3.01. X-ray of the foot indicated new displaced fractures second metatarsals,extensive subcutaneous gas in the soft tissue, which is worrisome for underlying infection and pathologic fracture. Internal medicine training service was consulted for admission antibiotic treatment. Orthopedics was notified and Dr. Due to recommended vancomycin and Zosyn with evaluation in the morning.  Meds:  Current Facility-Administered Medications for the 11/22/16 encounter [facility] Encounter)  Medication  . insulin aspart (novoLOG) injection 20 Units   Current Meds  Medication Sig  . doxycycline (VIBRA-TABS) 100 MG tablet Take 1 tablet (100 mg total) by mouth every 12 (twelve) hours.  Marland Kitchen escitalopram (LEXAPRO) 20 MG tablet Take 1 tablet (20 mg total) by mouth at bedtime.  . gabapentin (NEURONTIN) 300 MG capsule Take 1 capsule (300 mg total) by mouth 3 (three) times daily.  . insulin aspart (NOVOLOG FLEXPEN) 100 UNIT/ML FlexPen Inject 20 units 15 minutes before your two largest meals of the day  . Insulin Detemir (LEVEMIR FLEXPEN) 100 UNIT/ML Pen Inject 30 Units into the skin 2 (two) times daily.  . pravastatin (PRAVACHOL) 20 MG tablet Take 1 tablet (20 mg total) by mouth daily.  Allergies: Allergies as of 11/22/2016  . (No Known Allergies)   Past Medical History:  Diagnosis Date  . Arthritis    "neck; maybe in my fingers" (11/06/2016)  . Asthma   . Bipolar disorder ([place])    "IED: intentional deficit disorder"  . Depression   . Heart murmur    "when I was a child"  . Hypertension   . Type II diabetes mellitus (HCC)     Family History: Father, DMII Mother, DMII  Social History:  Denied tobacco and  EtOH Admitted only to marijuana use, denied IV drug use   Review of Systems: A complete ROS was negative except as per HPI.   Physical Exam: Blood pressure 120/81, pulse (!) 104, temperature 98.4 F (36.9 C), temperature source Oral, resp. rate 16, SpO2 100 %. Physical Exam  Constitutional: He is oriented to person, place, and time. He appears well-developed and well-nourished. No distress.  HENT:  Head: Normocephalic and atraumatic.  Eyes: Conjunctivae and EOM are normal.  Neck: Normal range of motion. No tracheal deviation present.  Cardiovascular: Normal rate and regular rhythm.   No murmur heard. Pulmonary/Chest: Effort normal and breath sounds normal. No stridor. No respiratory distress. He has no wheezes.  Abdominal: Soft. Bowel sounds are normal. He exhibits no distension. There is no tenderness.  Musculoskeletal: He exhibits tenderness (Mild tenderness only to the area directly overlying the toe.) and deformity (S/p surgical removal left great toe). He exhibits no edema.  Neurological: He is alert and oriented to person, place, and time.  Skin: Skin is warm. Capillary refill takes less than 2 seconds. He is not diaphoretic. There is erythema (In the postoperative location, pus drainage minimal, warm to touch).  Psychiatric: He has a normal mood and affect. Thought content normal.   DG foot: IMPRESSION:  New displaced fractures involving the second and third metatarsal necks. In addition, there is extensive subcutaneous gas in the soft tissues and concern for a small erosion involving the second toe proximal phalanx. Findings are worrisome for an underlying infection and these metatarsal fractures could be pathologic.  EKG: personally reviewed my interpretation is N/A  CXR: personally reviewed my interpretation is N/A  Assessment & Plan by Problem: Active Problems:   Uncontrolled type 2 diabetes mellitus with complication (HCC)   Cellulitis of foot, left   Diabetic  neuropathy ([place])   Fracture of 2nd metatarsal   Fracture of 3rd metatarsal   Postprocedural non-healing wound  [name] [name] is a 31 year old male who presented secondary to advise of his PCP and orthopedic surgeon. Given his presentation, he is most likely suffering from cellulitis secondary to his nonhealing postsurgical wound. This was likely due to patient's chronic diabetes mellitus, and very poor glycemic control. After reviewing the x-ray of the foot, concern for fracture of the second and third metatarsals as well as severe cellulitis is high. As patient denies systemic symptoms including fever, chills, muscle aches, it is mostly localized to the left foot at this time. Patient will be admitted for IV antibiotics and evaluation by orthopedics surgery in the morning.  1. Uncontrolled type 2 diabetes mellitus with complication Patient presented blood glucose value of 238 and a set of diabetic neuropathy and status post dilatation of the left great toe secondary to nonhealing diabetic ulcer. Patient is recently compliant with medication, but as needed better glycemic control. -Sliding scale insulin -CMP in am -CBC in am  2. Cellulitis of the left foot X-ray the foot indicated air bubbles in the  soft tissue which is consistent with cellulitis, area is erythematous, and warm to touch. -patient placed on IV vancomycin and Zosyn as per orthopedic recommendations. -Will be evaluated by orthopedics in the morning  3. Diabetic neuropathy Patient displays significant diabetic neuropathy, is unable to detect sensation palpation on the plantar surface of his feet bilaterally -Patient placed on his home dose of gabapentin 300mg  TID  4. Fracture of the 2nd and 3rd metatarsals as per DG foot X-ray of the foot indicated new displaced fractures of the neck of the second and third metatarsals -patient will be evaluated by orthopedic surgery in the morning, will await their recommendations  5.  Non-healing post surgical wound Patient is now approximately 2.5 weeks postop without the rate healing that is typically expected. -maintaining better glycemic control, preventing ambulation, and adhering to the medication regimen with respect to the antibiotics  Fluids:  Code: Full DVT ppx: enoxaparin  Diet: NPO after midnight Dispo: Admit patient to Observation with expected length of stay less than 2 midnights.  Signed: Kathi Ludwig, MD 11/22/2016, 6:05 PM  Pager: Pager# [phone]

## 2016-11-22 NOTE — Telephone Encounter (Signed)
Met with the patient when he was at the [facility] today for his appointment. Provided him with a SCAT application to complete as he was on his way to the ED. He also noted that he needs to go to the [facility] to obtain a letter confirming his homelessness for his orange card application

## 2016-11-22 NOTE — ED Provider Notes (Signed)
[place] DEPT Provider Note   CSN: [id number] Arrival date & time: 11/22/16  1236     History   Chief Complaint Chief Complaint  Patient presents with  . Foot Pain    HPI [name] [name] is a 31 y.o. male.  HPI 31 year old male who presents with wound check. He has a history of diabetes, hypertension, and hyperlipidemia. He was recently hospitalized for osteomyelitis of the left great toe status post large toe amputation by Dr. Sharol Given on 11/05/2016. He was discharged with outpatient doxycycline, which she reports that he's been intermittently compliant with. He had follow-up with his primary care doctor today who was concerned for persistent cellulitis of the left foot and sent him to the ED for evaluation. He states that he has no feeling in his foot so is unable to determine if he's had worsening infection or pain. He has not had fevers, chills or sweats, fatigue. Occasionally did have a little nausea and vomiting when he first got out of the hospital. I he is unsure if he has had any worsening swelling or redness to his foot.    Past Medical History:  Diagnosis Date  . Arthritis    "neck; maybe in my fingers" (11/06/2016)  . Asthma   . Bipolar disorder ([place])    "IED: intentional deficit disorder"  . Depression   . Heart murmur    "when I was a child"  . Hypertension   . Type II diabetes mellitus [facility])     Patient Active Problem List   Diagnosis Date Noted  . Acute cystitis with positive culture   . Cutaneous abscess of left foot   . Osteomyelitis ([place]) 11/03/2016  . Diabetic osteomyelitis ([place]) 10/27/2016  . Hyperlipidemia 10/17/2016  . Depression 10/17/2016  . Neck pain   . Asthma 04/11/2016  . Uncontrolled type 2 diabetes mellitus with complication ([place])   . Obesity, morbid ([place]) 09/06/2015  . Hypertension 09/06/2015    Past Surgical History:  Procedure Laterality Date  . AMPUTATION Left 11/05/2016   Procedure: AMPUTATION LEFT GREAT TOE;  Surgeon: Newt Minion, MD;  Location: [facility];  Service: Orthopedics;  Laterality: Left;  . DRESSING CHANGE UNDER ANESTHESIA N/A 04/13/2016   Procedure: DRESSING CHANGE UNDER ANESTHESIA;  Surgeon: Johnathan Hausen, MD;  Location: [facility];  Service: General;  Laterality: N/A;  . INCISION AND DRAINAGE ABSCESS Right 09/03/2015   Procedure: INCISION AND DRAINAGE ABSCESS;  Surgeon: Roseanne Kaufman, MD;  Location: [facility];  Service: Orthopedics;  Laterality: Right;  . INCISION AND DRAINAGE PERIRECTAL ABSCESS N/A 04/11/2016   Procedure: IRRIGATION AND DEBRIDEMENT RECTAL ABSCESS;  Surgeon: Johnathan Hausen, MD;  Location: [facility];  Service: General;  Laterality: N/A;  . [place]; 1989   x2   . IRRIGATION AND DEBRIDEMENT ABSCESS Left 08/31/2015   Procedure: IRRIGATION AND DEBRIDEMENT ABSCESS LEFT SHOULDER;  Surgeon: Alphonsa Overall, MD;  Location: [facility];  Service: General;  Laterality: Left;  . WOUND DEBRIDEMENT N/A 02/24/2016   Procedure: SACRAL WOUND DEBRIDEMENT;  Surgeon: Georganna Skeans, MD;  Location: [facility];  Service: General;  Laterality: N/A;       Home Medications    Prior to Admission medications   Medication Sig Start Date End Date Taking? Authorizing Provider  doxycycline (VIBRA-TABS) 100 MG tablet Take 1 tablet (100 mg total) by mouth every 12 (twelve) hours. 11/08/16 11/29/16 Yes Colbert Ewing, MD  escitalopram (LEXAPRO) 20 MG tablet Take 1 tablet (20 mg total) by mouth at bedtime. 10/04/16  Yes Ladell Pier, MD  gabapentin (NEURONTIN) 300 MG capsule Take 1 capsule (300 mg total) by mouth 3 (three) times daily. 11/22/16  Yes Ladell Pier, MD  insulin aspart (NOVOLOG FLEXPEN) 100 UNIT/ML FlexPen Inject 20 units 15 minutes before your two largest meals of the day 11/22/16  Yes Ladell Pier, MD  Insulin Detemir (LEVEMIR FLEXPEN) 100 UNIT/ML Pen Inject 30 Units into the skin 2 (two) times daily. 11/22/16  Yes Ladell Pier, MD  pravastatin (PRAVACHOL) 20 MG tablet Take 1 tablet (20 mg total)  by mouth daily. 10/04/16  Yes Ladell Pier, MD  acetaminophen (TYLENOL) 325 MG tablet Take 2 tablets (650 mg total) by mouth every 6 (six) hours as needed for mild pain or fever (for pain). Patient not taking: Reported on 11/22/2016 04/17/16   Regalado, Jerald Kief A, MD  gentamicin cream (GARAMYCIN) 0.1 % Apply 1 application topically daily. Patient not taking: Reported on 11/22/2016 10/29/16   Ladell Pier, MD  mupirocin cream (BACTROBAN) 2 % Apply 1 application topically 2 (two) times daily. Patient not taking: Reported on 11/22/2016 10/04/16   Ladell Pier, MD    Family History Family History  Problem Relation Age of Onset  . Diabetes type II Mother   . Diabetes type II Father     Social History Social History  Substance Use Topics  . Smoking status: Never Smoker  . Smokeless tobacco: Never Used  . Alcohol use Yes     Comment: 11/06/2016 "I'll drink a little at parties; maybe 5 times/year"     Allergies   Patient has no known allergies.   Review of Systems Review of Systems  Constitutional: Negative for fever.  Respiratory: Negative for shortness of breath.   Cardiovascular: Negative for chest pain.  Skin: Positive for wound.  All other systems reviewed and are negative.    Physical Exam Updated Vital Signs BP 120/81   Pulse (!) 104   Temp 98.4 F (36.9 C) (Oral)   Resp 16   SpO2 100%   Physical Exam Physical Exam  Nursing note and vitals reviewed. Constitutional: Well developed, well nourished, non-toxic, and in no acute distress Head: Normocephalic and atraumatic.  Mouth/Throat: Oropharynx is clear and moist.  Neck: Normal range of motion. Neck supple.  Cardiovascular: Tachycardic rate and regular rhythm.   Pulmonary/Chest: Effort normal and breath sounds normal.  Abdominal: Soft. There is no tenderness. There is no rebound and no guarding.  Musculoskeletal: Please see picture.  Neurological: Alert, no facial droop, fluent speech, moves all  extremities symmetrically Skin: Skin is warm and dry.  Psychiatric: Cooperative       ED Treatments / Results  Labs (all labs ordered are listed, but only abnormal results are displayed) Labs Reviewed  COMPREHENSIVE METABOLIC PANEL - Abnormal; Notable for the following:       Result Value   Potassium 3.4 (*)    Glucose, Bld 238 (*)    Albumin 3.3 (*)    ALT 14 (*)    All other components within normal limits  CBC WITH DIFFERENTIAL/PLATELET - Abnormal; Notable for the following:    WBC 11.5 (*)    RBC 4.01 (*)    Hemoglobin 11.3 (*)    HCT 34.3 (*)    Neutro Abs 7.8 (*)    All other components within normal limits  I-STAT CG4 LACTIC ACID, ED - Abnormal; Notable for the following:    Lactic Acid, Venous 3.01 (*)    All other  components within normal limits  CULTURE, BLOOD (ROUTINE X 2)  CULTURE, BLOOD (ROUTINE X 2)  URINALYSIS, ROUTINE W REFLEX MICROSCOPIC  I-STAT CG4 LACTIC ACID, ED    EKG  EKG Interpretation None       Radiology No results found.  Procedures Procedures (including critical care time)  Medications Ordered in ED Medications  vancomycin (VANCOCIN) IVPB 1000 mg/200 mL premix (not administered)  piperacillin-tazobactam (ZOSYN) IVPB 3.375 g (3.375 g Intravenous New Bag/Given 11/22/16 1521)  sodium chloride 0.9 % bolus 1,000 mL (1,000 mLs Intravenous New Bag/Given 11/22/16 1521)     Initial Impression / Assessment and Plan / ED Course  I have reviewed the triage vital signs and the nursing notes.  Pertinent labs & imaging results that were available during my care of the patient were reviewed by me and considered in my medical decision making (see chart for details).     31 year old male who presents with concern for wound infection after recent left large toe amputation. He is afebrile but mildly tachycardic. Normotensive. In no acute distress. His of erythema, warmth and swelling to the surgical wound over the left foot. This is concerning for  ongoing cellulitis. We'll obtain x-ray of the foot, which is pending. Blood work notable for mild leukocytosis of 11.5 which is improving from his recent hospitalization. He does have elevated lactate of 3.0. No other evidence of end organ damage. I given IV fluids. I spoke with Dr. Sharol Given from orthopedic surgery, who recommended admission. He will continue to consult in the hospital. Zosyn and vancomycin is started. Discussed with internal medicine teaching service, who will admit for ongoing management.  Final Clinical Impressions(s) / ED Diagnoses   Final diagnoses:  Wound infection  Wound infection after surgery, initial encounter    New Prescriptions New Prescriptions   No medications on file     Forde Dandy, MD 11/22/16 1546

## 2016-11-22 NOTE — Progress Notes (Signed)
Pharmacy Antibiotic Note  [name] [name] is a 31 y.o. male admitted on 11/22/2016 with diabetic foot infection .  Pharmacy has been consulted for vancomycin and zosyn dosing.  Patient currently afebrile, wbc slightly elevated at 11, LA 3, other labs pending.   Plan: Vancomycin 1000 IV every 8  hours.  Goal trough 10-15 mcg/mL. Zosyn 3.375g IV q8h (4 hour infusion).     Temp (24hrs), Avg:98.6 F (37 C), Min:98.4 F (36.9 C), Max:98.7 F (37.1 C)   Recent Labs Lab 11/22/16 1300 11/22/16 1328  WBC 11.5*  --   CREATININE 1.02  --   LATICACIDVEN  --  3.01*    Estimated Creatinine Clearance: 116.7 mL/min (by C-G formula based on SCr of 1.02 mg/dL).    No Known Allergies  Thank you for allowing pharmacy to be a part of this patient's care.  Erin Hearing PharmD., BCPS Clinical Pharmacist Pager [phone] 11/22/2016 6:51 PM

## 2016-11-23 DIAGNOSIS — E1165 Type 2 diabetes mellitus with hyperglycemia: Secondary | ICD-10-CM

## 2016-11-23 DIAGNOSIS — E1140 Type 2 diabetes mellitus with diabetic neuropathy, unspecified: Secondary | ICD-10-CM

## 2016-11-23 DIAGNOSIS — S92332A Displaced fracture of third metatarsal bone, left foot, initial encounter for closed fracture: Secondary | ICD-10-CM

## 2016-11-23 DIAGNOSIS — M7989 Other specified soft tissue disorders: Secondary | ICD-10-CM

## 2016-11-23 DIAGNOSIS — S92322A Displaced fracture of second metatarsal bone, left foot, initial encounter for closed fracture: Secondary | ICD-10-CM

## 2016-11-23 DIAGNOSIS — Z89412 Acquired absence of left great toe: Secondary | ICD-10-CM

## 2016-11-23 DIAGNOSIS — X58XXXA Exposure to other specified factors, initial encounter: Secondary | ICD-10-CM

## 2016-11-23 DIAGNOSIS — Z794 Long term (current) use of insulin: Secondary | ICD-10-CM

## 2016-11-23 LAB — CBC
HCT: 30.9 % — ABNORMAL LOW (ref 39.0–52.0)
Hemoglobin: 10.1 g/dL — ABNORMAL LOW (ref 13.0–17.0)
MCH: 27.8 pg (ref 26.0–34.0)
MCHC: 32.7 g/dL (ref 30.0–36.0)
MCV: 85.1 fL (ref 78.0–100.0)
Platelets: 262 10*3/uL (ref 150–400)
RBC: 3.63 MIL/uL — ABNORMAL LOW (ref 4.22–5.81)
RDW: 12.9 % (ref 11.5–15.5)
WBC: 8.4 10*3/uL (ref 4.0–10.5)

## 2016-11-23 LAB — BASIC METABOLIC PANEL
Anion gap: 6 (ref 5–15)
BUN: 13 mg/dL (ref 6–20)
CHLORIDE: 106 mmol/L (ref 101–111)
CO2: 27 mmol/L (ref 22–32)
Calcium: 8.8 mg/dL — ABNORMAL LOW (ref 8.9–10.3)
Creatinine, Ser: 1.02 mg/dL (ref 0.61–1.24)
GFR calc non Af Amer: >60 mL/min (ref >60)
Glucose, Bld: 194 mg/dL — ABNORMAL HIGH (ref 65–99)
POTASSIUM: 3.5 mmol/L (ref 3.5–5.1)
SODIUM: 139 mmol/L (ref 135–145)

## 2016-11-23 LAB — GLUCOSE, POCT (MANUAL RESULT ENTRY)
POC Glucose: 409 mg/dl — AB (ref 70–99)
POC Glucose: 410 mg/dL — AB (ref 70–99)

## 2016-11-23 LAB — GLUCOSE, CAPILLARY
GLUCOSE-CAPILLARY: 217 mg/dL — AB (ref 65–99)
Glucose-Capillary: 94 mg/dL (ref 65–99)
Glucose-Capillary: 279 mg/dL — ABNORMAL HIGH (ref 65–99)
Glucose-Capillary: 146 mg/dL — ABNORMAL HIGH (ref 65–99)

## 2016-11-23 MED ORDER — INSULIN ASPART 100 UNIT/ML ~~LOC~~ SOLN: 20.0000 [IU] | Freq: Once | SUBCUTANEOUS | Status: DC

## 2016-11-23 MED ORDER — GLUCERNA SHAKE PO LIQD
237.0000 mL | Freq: Two times a day (BID) | ORAL | Status: DC
  Administered 2016-11-23 – 2016-11-26 (×6): 237 mL via ORAL

## 2016-11-23 MED ORDER — INSULIN DETEMIR 100 UNIT/ML ~~LOC~~ SOLN
25.0000 [IU] | Freq: Two times a day (BID) | SUBCUTANEOUS | Status: DC
  Administered 2016-11-23 – 2016-11-26 (×7): 25 [IU] via SUBCUTANEOUS
  Filled 2016-11-23 (×7): qty 0.25

## 2016-11-23 MED ORDER — INSULIN ASPART 100 UNIT/ML ~~LOC~~ SOLN
10.0000 [IU] | Freq: Three times a day (TID) | SUBCUTANEOUS | Status: DC
  Administered 2016-11-23 – 2016-11-25 (×8): 10 [IU] via SUBCUTANEOUS

## 2016-11-23 NOTE — Progress Notes (Signed)
Initial Nutrition Assessment  DOCUMENTATION CODES:   Obesity unspecified  INTERVENTION:  Provide Glucerna Shake po BID, each supplement provides 220 kcal and 10 grams of protein.  NUTRITION DIAGNOSIS:   Increased nutrient needs related to wound healing as evidenced by estimated needs.  GOAL:   Patient will meet greater than or equal to 90% of their needs  MONITOR:   PO intake, Supplement acceptance, Labs, Weight trends, Skin, I & O's  REASON FOR ASSESSMENT:   Consult Wound healing  ASSESSMENT:   31 year old male who presented to the [facility] ED and on the recommendation of his physician. He has a past medical history significant for exercise induced asthma, bipolar disorder unknown type, depression, diabetes mellitus since age of 53, diabetic neuropathy lower extremities, and is status post left great toe amputation for diabetic ulcer. X-ray of the foot indicated new displaced fractures second metatarsals,extensive subcutaneous gas in the soft tissue, which is worrisome for underlying infection and pathologic fracture.  Diet has been advanced. Pt reports having a good appetite currently and PTA with usual consumption of at least 2 meals a day. Pt does report having financial problems thus consumes mostly ramen noodles, hot dogs, and sandwiches. Pt reports consuming fruits and vegetables when he is able to afford them. Pt educated on monitoring his sodium intake with the processed foods and discussed healthy food alternatives on a budget. Weight has been stable. RD to order nutritional supplements to aid in wound healing. Pt with no observed significant fat or muscle mass loss.   Labs and medications reviewed.   Diet Order:  Diet Carb Modified Fluid consistency: Thin; Room service appropriate? Yes  Skin:  Wound (see comment) (Incision on L foot)  Last BM:  9/6  Height:   Ht Readings from Last 1 Encounters:  11/22/16 5\' 7"  (1.702 m)    Weight:   Wt Readings from Last 1  Encounters:  11/22/16 212 lb 4.8 oz (96.3 kg)    Ideal Body Weight:  67.27 kg  BMI:  Body mass index is 33.25 kg/m.  Estimated Nutritional Needs:   Kcal:  1900-2100  Protein:  100-115 grams  Fluid:  1.9 -2.1 L/day  EDUCATION NEEDS:   Education needs addressed  Corrin Parker, MS, RD, LDN Pager # [phone] After hours/ weekend pager # [phone]

## 2016-11-23 NOTE — H&P (Signed)
WOC consult was requested for foot wound.  "X-ray of the foot indicated new displaced fractures to second metatarsals,extensive subcutaneous gas in the soft tissue, which is worrisome for underlying infection and pathologic fracture." This is beyond [place] scope of practice, EMR indicates that ortho service has been consulted.  Please refer to their team for further plan of care. Please re-consult if further assistance is needed.  Thank-you,  Julien Girt MSN, [place], [place], [place], [place]

## 2016-11-23 NOTE — Progress Notes (Signed)
Inpatient Diabetes Program Recommendations  AACE/ADA: New Consensus Statement on Inpatient Glycemic Control (2015)  Target Ranges:  Prepandial:   less than 140 mg/dL      Peak postprandial:   less than 180 mg/dL (1-2 hours)      Critically ill patients:  140 - 180 mg/dL   Lab Results  Component Value Date   GLUCAP 94 11/23/2016   HGBA1C 11.0 (H) 11/04/2016    Review of Glycemic Control  Diabetes history: DM2 Outpatient Diabetes medications: Levemir 30 units bid + Novolog 20 units bid meal coverage Current orders for Inpatient glycemic control: Levemir 25 units bid + Novolog 10 tid + Moderate Novolog correction  Inpatient Diabetes Program Recommendations:   Spoke with pt @ bedside about  A1C average blood glucose 269 over the past 2-3 months (11.0 on 11/04/16)results with them and explained what an A1C is, basic pathophysiology of DM Type 2, basic home care, basic diabetes diet nutrition principles, importance of checking CBGs and maintaining good CBG control to prevent long-term and short-term complications. Reviewed signs and symptoms of hyperglycemia and hypoglycemia and how to treat hypoglycemia at home. Also reviewed blood sugar goals at home.  Patient states he has been keeping his appointments @ the [facility] and taking his insulin as prescribed. Reviewed with patient how to give injections with insulin pen. Patient had not been changing to a new needle and flushing each time but states understanding and willingness to comply.  Thank you, Nani Gasser. Cartha Rotert, RN, MSN, CDE  Diabetes Coordinator Inpatient Glycemic Control Team Team Pager [phone] (8am-5pm) 11/23/2016 2:21 PM

## 2016-11-23 NOTE — Addendum Note (Signed)
Addended by: Boykin Reaper R on: 11/23/2016 09:06 AM   Modules accepted: Orders

## 2016-11-23 NOTE — Progress Notes (Signed)
   Subjective:  Patient reports no fevers or chills during the night. He denies any pain in his foot.  We discussed how he was taking his insulin at home. It was clear from our conversation that he had a poor understanding of how he should be taking his insulin and we are still unsure exactly how he was taking it, or if he was taking it.   Objective:  Vital signs in last 24 hours: Vitals:   11/22/16 1252 11/22/16 2029  BP: 120/81 (!) 142/90  Pulse: (!) 104 91  Resp: 16 20  Temp: 98.4 F (36.9 C) 99.1 F (37.3 C)  TempSrc: Oral Oral  SpO2: 100% 100%  Weight:  212 lb 4.8 oz (96.3 kg)  Height:  5\' 7"  (1.702 m)   Physical Exam  Constitutional: He appears well-developed and well-nourished.  Cardiovascular: Normal rate, regular rhythm and normal heart sounds.  Exam reveals no friction rub.   No murmur heard. Pulmonary/Chest: Effort normal and breath sounds normal. No respiratory distress. He has no wheezes. He has no rales.  Musculoskeletal:  Did not examine foot this am due to Dr. Sharol Given just bandaging it before arrival.    Neurological: He is alert.    Assessment/Plan:  Active Problems:   Uncontrolled type 2 diabetes mellitus with complication (HCC)   Cellulitis of foot, left   Diabetic neuropathy (HCC)   Fracture of 2nd metatarsal   Fracture of 3rd metatarsal   Postprocedural non-healing wound  1. Uncontrolled type 2 diabetes mellitus with complication Patient presented blood glucose value of 238 and a set of diabetic neuropathy and status post removal of the left great toe secondary to nonhealing diabetic ulcer.   -it was clear from talking with the patient he has poor understanding of how to use his insulin and we are unsure of how much or if he is taking his insulin. -We have consulted a diabetes educator to help [place] with this issue. -A1C returned as 11.8 -Pt now on novolog 10 units with meals and levemir 25 units BID -Sliding scale for correctional insulin   2. New  Fractures of 2nd and 3rd metatarsals/ Cellulitis of the left foot X-ray the foot indicated air bubbles in the soft tissue which is consistent with cellulitis, and showed new fractures, area is erythematous, and warm to touch. Mild leukocytosis on admission Pt is afebrile and has normal white count this am post first treatment with vanc/zosyn.    -ortho saw patient this morning and recommended to continue antibiotics over the weekend and reevaluate on Monday.   3. Diabetic neuropathy Patient displays significant diabetic neuropathy, is unable to detect sensation palpation on the plantar surface of his feet bilaterally -Patient placed on his home dose of gabapentin 300mg  TID      Dispo: Anticipated discharge in approximately 6 day(s).   Katherine Roan, MD 11/23/2016, 11:24 AM

## 2016-11-24 ENCOUNTER — Encounter (HOSPITAL_COMMUNITY): Payer: Self-pay | Admitting: *Deleted

## 2016-11-24 DIAGNOSIS — T8189XA Other complications of procedures, not elsewhere classified, initial encounter: Secondary | ICD-10-CM

## 2016-11-24 DIAGNOSIS — E1142 Type 2 diabetes mellitus with diabetic polyneuropathy: Secondary | ICD-10-CM

## 2016-11-24 DIAGNOSIS — S92325A Nondisplaced fracture of second metatarsal bone, left foot, initial encounter for closed fracture: Secondary | ICD-10-CM

## 2016-11-24 DIAGNOSIS — L02612 Cutaneous abscess of left foot: Secondary | ICD-10-CM

## 2016-11-24 DIAGNOSIS — T814XXA Infection following a procedure, initial encounter: Principal | ICD-10-CM

## 2016-11-24 DIAGNOSIS — T148XXA Other injury of unspecified body region, initial encounter: Secondary | ICD-10-CM

## 2016-11-24 DIAGNOSIS — E1169 Type 2 diabetes mellitus with other specified complication: Secondary | ICD-10-CM

## 2016-11-24 DIAGNOSIS — L03116 Cellulitis of left lower limb: Secondary | ICD-10-CM

## 2016-11-24 LAB — CBC WITH DIFFERENTIAL/PLATELET
BASOS ABS: 0.1 10*3/uL (ref 0.0–0.1)
BASOS PCT: 1 %
EOS PCT: 4 %
Eosinophils Absolute: 0.3 10*3/uL (ref 0.0–0.7)
HCT: 33.4 % — ABNORMAL LOW (ref 39.0–52.0)
Hemoglobin: 10.9 g/dL — ABNORMAL LOW (ref 13.0–17.0)
Lymphocytes Relative: 30 %
Lymphs Abs: 2.5 10*3/uL (ref 0.7–4.0)
MCH: 27.6 pg (ref 26.0–34.0)
MCHC: 32.6 g/dL (ref 30.0–36.0)
MCV: 84.6 fL (ref 78.0–100.0)
MONO ABS: 0.6 10*3/uL (ref 0.1–1.0)
Monocytes Relative: 7 %
Neutro Abs: 5.1 10*3/uL (ref 1.7–7.7)
Neutrophils Relative %: 58 %
PLATELETS: 271 10*3/uL (ref 150–400)
RBC: 3.95 MIL/uL — ABNORMAL LOW (ref 4.22–5.81)
RDW: 13 % (ref 11.5–15.5)
WBC: 8.6 10*3/uL (ref 4.0–10.5)

## 2016-11-24 LAB — GLUCOSE, CAPILLARY
GLUCOSE-CAPILLARY: 130 mg/dL — AB (ref 65–99)
GLUCOSE-CAPILLARY: 160 mg/dL — AB (ref 65–99)
GLUCOSE-CAPILLARY: 116 mg/dL — AB (ref 65–99)
Glucose-Capillary: 184 mg/dL — ABNORMAL HIGH (ref 65–99)

## 2016-11-24 LAB — BASIC METABOLIC PANEL
ANION GAP: 6 (ref 5–15)
BUN: 11 mg/dL (ref 6–20)
CALCIUM: 9.1 mg/dL (ref 8.9–10.3)
CO2: 30 mmol/L (ref 22–32)
Chloride: 101 mmol/L (ref 101–111)
Creatinine, Ser: 0.96 mg/dL (ref 0.61–1.24)
GFR calc Af Amer: >60 mL/min (ref >60)
GLUCOSE: 142 mg/dL — AB (ref 65–99)
POTASSIUM: 3.4 mmol/L — AB (ref 3.5–5.1)
SODIUM: 137 mmol/L (ref 135–145)

## 2016-11-24 LAB — VANCOMYCIN, TROUGH: VANCOMYCIN TR: 27 ug/mL — AB (ref 15–20)

## 2016-11-24 MED ORDER — VANCOMYCIN HCL IN DEXTROSE 750-5 MG/150ML-% IV SOLN
750.0000 mg | Freq: Two times a day (BID) | INTRAVENOUS | Status: DC
  Administered 2016-11-24 – 2016-11-26 (×4): 750 mg via INTRAVENOUS
  Filled 2016-11-24 (×4): qty 150

## 2016-11-24 NOTE — Progress Notes (Signed)
Pharmacy Antibiotic Note  [name] [name] is a 31 y.o. male admitted on 11/22/2016 with diabetic food infection.  Imaging concerning for erosion of the 2nd toe.  Pharmacy has been consulted for vancomycin and Zosyn dosing with plan to re-evaluate on Monday.  Patient's renal function is stable and his vancomycin trough is elevated at 27 mcg/mL.  Patient is afebrile with WNL WBC.   Plan: Reduce vanc to 750mg  IV Q12H for goal trough 10-15 mcg/mL Continue Zosyn 3.375gm IV Q8H, 4 hr infusion Monitor renal fxn, clinical progress, repeat vanc trough at [facility]   Height: 5\' 7"  (170.2 cm) Weight: 212 lb 4.8 oz (96.3 kg) IBW/kg (Calculated) : 66.1  Temp (24hrs), Avg:98.5 F (36.9 C), Min:98.3 F (36.8 C), Max:98.6 F (37 C)   Recent Labs Lab 11/22/16 1300 11/22/16 1328 11/23/16 0438 11/24/16 0350 11/24/16 1240  WBC 11.5*  --  8.4 8.6  --   CREATININE 1.02  --  1.02 0.96  --   LATICACIDVEN  --  3.01*  --   --   --   VANCOTROUGH  --   --   --   --  27*    Estimated Creatinine Clearance: 124.5 mL/min (by C-G formula based on SCr of 0.96 mg/dL).    No Known Allergies    Vanc 9/6 >> Zosyn 9/6 >>  9/8 VT = 27 mcg/mL on 1g q8 >> 750mg  q12  9/6 BCx - NGTD    [name] [name] D. Mina Marble, PharmD, BCPS Pager:  [phone] 11/24/2016, 2:23 PM

## 2016-11-24 NOTE — Progress Notes (Signed)
Medicine attending: Clinical status and pertinent laboratory data reviewed with resident physician Dr. Lorella Nimrod and I concur with her evaluation and management plan which we discussed together. Young man with poorly controlled type 2 diabetes who is 2 weeks status post amputation of the left great toe for osteomyelitis/infection.  He was only partially compliant with antibiotics.  He returned on September 6 with extension of infection to the second metatarsal toe. Random glucose 317 repeat venous value to 38 with bicarbonate 22 and anion gap 10. He was started on broad-spectrum parenteral antibiotics.  He was seen in consultation by orthopedic surgery.  He will be reevaluated for the need of any additional surgery after 48-72 hours of antibiotic therapy.  He is currently afebrile.  White blood count trending down from 11,500 on admission to [place] today.  Blood sugars under good control at this time.

## 2016-11-24 NOTE — Progress Notes (Addendum)
   Subjective: patient was feeling better when seen this morning. He has no complaints.  Objective:  Vital signs in last 24 hours: Vitals:   11/23/16 1328 11/23/16 2007 11/24/16 0520 11/24/16 1438  BP: (!) 132/93 (!) 133/94 (!) 149/85 123/90  Pulse: 81 91 88 95  Resp: 20 18  18   Temp: 97.8 F (36.6 C) 98.6 F (37 C) 98.3 F (36.8 C) 98.4 F (36.9 C)  TempSrc: Oral Oral Oral Oral  SpO2: 100% 100% 100% 100%  Weight:      Height:       Gen. well-developed, well-nourished gentleman, in no acute distress. Extremities. Mild erythema and some oozing get surgical site, improved as compare to before. Patient has no pain or sensations in his feet. Chest. Clear bilaterally. CV. Regular rate and rhythm. Abdomen.soft, non tender, bowel sounds positive.  Assessment/Plan:  Uncontrolled type 2 diabetes mellitus with complication: patient has very poor insight in his disease.his blood sugar improved with with a little smaller dose of Levemir and NovoLog in hospital. Most likely a compliance issue at home. He will need more education regarding his diabetes and its management. -Continue novolog 10 units with meals and levemir 25 units BID. -continue sliding scale for correction.  New Fractures of 2nd and 3rd metatarsals/ Cellulitis of the left foot. Most likely due to extension of his infection. Patient to be remained on IV antibiotics and orthopedic will reevaluate him on Monday. -Continue vancomycin and Zosyn.  Diabetic neuropathy Patient displays significant diabetic neuropathy, is unable to detect sensation palpation on the plantar surface of his feet bilaterally. -continue home dose of gabapentin 300 mg 3 times a day.   Dispo: Anticipated discharge in approximately 3-4 day(s).   Lorella Nimrod, MD 11/24/2016, 4:40 PM Pager: [phone]

## 2016-11-25 LAB — GLUCOSE, CAPILLARY
GLUCOSE-CAPILLARY: 128 mg/dL — AB (ref 65–99)
GLUCOSE-CAPILLARY: 310 mg/dL — AB (ref 65–99)
GLUCOSE-CAPILLARY: 262 mg/dL — AB (ref 65–99)
Glucose-Capillary: 64 mg/dL — ABNORMAL LOW (ref 65–99)
Glucose-Capillary: 85 mg/dL (ref 65–99)
Glucose-Capillary: 181 mg/dL — ABNORMAL HIGH (ref 65–99)
Glucose-Capillary: 270 mg/dL — ABNORMAL HIGH (ref 65–99)

## 2016-11-25 MED ORDER — INSULIN ASPART 100 UNIT/ML ~~LOC~~ SOLN
12.0000 [IU] | Freq: Three times a day (TID) | SUBCUTANEOUS | Status: DC
  Administered 2016-11-26: 12 [IU] via SUBCUTANEOUS

## 2016-11-25 NOTE — Progress Notes (Signed)
   Subjective: patient has no complaints today. He was resting comfortably in his bed.  Objective:  Vital signs in last 24 hours: Vitals:   11/24/16 0520 11/24/16 1438 11/24/16 2032 11/25/16 0440  BP: (!) 149/85 123/90 (!) 139/97 133/67  Pulse: 88 95 92 98  Resp:  18 18 18   Temp: 98.3 F (36.8 C) 98.4 F (36.9 C) 98.1 F (36.7 C) 98.1 F (36.7 C)  TempSrc: Oral Oral Oral Oral  SpO2: 100% 100% 100% 100%  Weight:      Height:       Gen. well-developed, well-nourished gentleman, in no acute distress. Extremities. Mild erythema seems improving, and oozing which seems little worse today from his surgical site,  Patient has no pain or sensations in his feet. Chest. Clear bilaterally. CV. Regular rate and rhythm. Abdomen.soft, non tender, bowel sounds positive.   Assessment/Plan:  Uncontrolled type 2 diabetes mellitus with complication: patient has very poor insight in his disease. Seems like a postprandial elevation today. -increase NovoLog to 12 units with meals along with moderate sliding scale. -Continue Levemir 25 units twice a day.  New Fractures of 2nd and 3rd metatarsals/Cellulitis of the left foot. Most likely due to extension of his infection. Patient to be remained on IV antibiotics and orthopedic will reevaluate him on Monday. -Continue vancomycin and Zosyn.  Diabetic neuropathy Patient displays significant diabetic neuropathy, is unable to detect sensation palpation on the plantar surface of his feet bilaterally. -continue home dose of gabapentin 300 mg 3 times a day.  Dispo: Anticipated discharge in approximately 2-3 day(s).   Lorella Nimrod, MD 11/25/2016, 12:23 PM Pager: [phone]

## 2016-11-26 ENCOUNTER — Telehealth: Payer: Self-pay

## 2016-11-26 LAB — GLUCOSE, CAPILLARY
GLUCOSE-CAPILLARY: 112 mg/dL — AB (ref 65–99)
Glucose-Capillary: 221 mg/dL — ABNORMAL HIGH (ref 65–99)

## 2016-11-26 MED ORDER — AMOXICILLIN-POT CLAVULANATE 875-125 MG PO TABS: 1.0000 | ORAL_TABLET | Freq: Two times a day (BID) | ORAL | 0 refills | Status: AC

## 2016-11-26 MED ORDER — DOXYCYCLINE HYCLATE 100 MG PO TABS: 100.0000 mg | ORAL_TABLET | Freq: Two times a day (BID) | ORAL | 0 refills | Status: DC

## 2016-11-26 MED ORDER — AMOXICILLIN-POT CLAVULANATE 875-125 MG PO TABS: 1.0000 | ORAL_TABLET | Freq: Two times a day (BID) | ORAL | 0 refills | Status: DC

## 2016-11-26 MED ORDER — DOXYCYCLINE HYCLATE 100 MG PO TABS: 100.0000 mg | ORAL_TABLET | Freq: Two times a day (BID) | ORAL | 0 refills | Status: AC

## 2016-11-26 MED FILL — AMOX-CLAV 875-125 MG TABLET: 875-125 | 11 days supply | Qty: 22 | Fill #0

## 2016-11-26 MED FILL — ?DOXYCYCLINE HYC 100MG TAB: 100 | 11 days supply | Qty: 22 | Fill #0

## 2016-11-26 NOTE — Progress Notes (Signed)
Patient ID: [name] [name], male   DOB: June 25, 1985, 31 y.o.   MRN: [id number] Patient's foot is much improved. The cellulitis dorsally is completely resolved laterally there is a little bit of redness but there is no drainage no signs of infection. Radiographs shows some destructive changes base of the proximal phalanx second toe and stress fractures through the metatarsal necks of the second and third metatarsals. Feel the patient could be discharged today on doxycycline 100 mg twice a day and patient is to be strict nonweightbearing on the left foot and patient is to change the dressing daily.

## 2016-11-26 NOTE — Progress Notes (Signed)
Pt discharge instructions reviewed with pt. Pt verbalizes understanding. Pt belongings with pt. Pt is not in distress. Pt wheelchair to take home is in the room. Pt discharged via wheelchair. Security is driving pt to the [employer].

## 2016-11-26 NOTE — Progress Notes (Signed)
   Subjective: Patient feeling much better today. Ready to go home as soon as we will permit him. He stated that he slept well, denied pain in his foot as well as chest pain, abdominal pain, headache, diarrhea, nausea, vomiting, fever, visual changes or dysuria. Patient is in agreement with the plan to discharge home today with antibiotics.   Objective:  Vital signs in last 24 hours: Vitals:   11/25/16 0440 11/25/16 1457 11/25/16 1933 11/26/16 0505  BP: 133/67 (!) 124/91 (!) 150/98 139/61  Pulse: 98 90 97 85  Resp: 18 18 18 18   Temp: 98.1 F (36.7 C) 98.1 F (36.7 C) 98.5 F (36.9 C) 98.5 F (36.9 C)  TempSrc: Oral Oral Oral Oral  SpO2: 100% 99% 100% 100%  Weight:      Height:       ROS negative except as per HPI.  Physical Exam  Constitutional: He appears well-developed and well-nourished. No distress.  HENT:  Head: Normocephalic and atraumatic.  Eyes: Conjunctivae and EOM are normal.  Neck: Normal range of motion.  Cardiovascular: Normal rate and regular rhythm.   No murmur heard. Pulmonary/Chest: Effort normal and breath sounds normal. No stridor. No respiratory distress.  Abdominal: Soft. Bowel sounds are normal. He exhibits no distension.  Musculoskeletal: He exhibits deformity (S/p amputation of the left great toe). He exhibits no edema or tenderness.  Neurological: He is alert.  Skin: Skin is warm. He is not diaphoretic.  Psychiatric: He has a normal mood and affect.    Assessment/Plan:  Active Problems:   Uncontrolled type 2 diabetes mellitus with complication (HCC)   Cellulitis of foot, left   Diabetic neuropathy (HCC)   Fracture of 2nd metatarsal   Fracture of 3rd metatarsal   Postprocedural non-healing wound  Uncontrolled type 2 diabetes mellitus with complication: patient has very poor insight in his disease. Seems like a postprandial elevation today. -increase NovoLog to 12 units with meals along with moderate sliding scale. -Continue Levemir 25 units  twice a day.  New Fractures of 2nd and 3rd metatarsals/Cellulitis of the left foot. Most likely due to extension of his infection. Patient to be remained on IV antibiotics and orthopedic will reevaluate him on Monday. -Discontinue vancomycin and Zosyn today -Will start Doxycycline 100mg  BID today -Will discharge home with Augmentin and Doxy today  Diabetic neuropathy Patient displays significant diabetic neuropathy, is unable to detect sensation palpation on the plantar surface of his feet bilaterally. -continue home dose of gabapentin 300 mg 3 times a day.  Diet: Carb modified  DVT ppx: enoxaparin  Code: Full Fluids: None Dispo: Anticipated discharge in approximately today.   Kathi Ludwig, MD 11/26/2016, 7:26 AM Pager: Pager# [phone]

## 2016-11-26 NOTE — Care Management Note (Signed)
Case Management Note  Patient Details  Name: [name] [name] MRN: [id number] Date of Birth: [birth year]  Subjective/Objective:                    Action/Plan: Pt discharging home with orders for [facility] services for med management and dressing changes. Pt does not have insurance so [name] [name] notified to see if qualifies for charity services.  Pt with orders for wheelchair. [name] [name] with [facility] notified and will deliver the equipment to the room. Pt uses [facility] for his f/u and medications. Pt does not have transportation home. CM asked security to provide transportation to [facility] and then he will take a cab home. CSW provided a cab voucher to assist with his transportation home.   Expected Discharge Date:  11/26/16               Expected Discharge Plan:  [place]  In-House Referral:  NA  Discharge planning Services  CM Consult  Post Acute Care Choice:  Home Health, Durable Medical Equipment Choice offered to:  Patient  DME Arranged:  Wheelchair manual DME Agency:  [place]:  RN [facility] Agency:  [place] (charity)  Status of Service:  Completed, signed off  If discussed at [employer] of Stay Meetings, dates discussed:    Additional Comments:  [name] Friar, RN 11/26/2016, 12:34 PM

## 2016-11-26 NOTE — Progress Notes (Deleted)
Name: [name] [name] MRN: [id number] DOB: 12-Feb-1986 31 y.o. PCP: Ladell Pier, MD  Date of Admission: 11/22/2016  1:47 PM Date of Discharge: 11/26/2016 Attending Physician: Lucious Groves, DO  Discharge Diagnosis: Active Problems:   Poorly controlled type 2 diabetes mellitus with complication ([place])   Cellulitis of foot, left   Diabetic neuropathy ([place])   Fracture of 2nd metatarsal   Fracture of 3rd metatarsal   Postprocedural non-healing wound   Discharge Medications: Allergies as of 11/26/2016   No Known Allergies     Medication List    TAKE these medications   acetaminophen 325 MG tablet Commonly known as:  TYLENOL Take 2 tablets (650 mg total) by mouth every 6 (six) hours as needed for mild pain or fever (for pain).   amoxicillin-clavulanate 875-125 MG tablet Commonly known as:  AUGMENTIN Take 1 tablet by mouth 2 (two) times daily.   doxycycline 100 MG tablet Commonly known as:  VIBRA-TABS Take 1 tablet (100 mg total) by mouth every 12 (twelve) hours.   escitalopram 20 MG tablet Commonly known as:  LEXAPRO Take 1 tablet (20 mg total) by mouth at bedtime.   gabapentin 300 MG capsule Commonly known as:  NEURONTIN Take 1 capsule (300 mg total) by mouth 3 (three) times daily.   gentamicin cream 0.1 % Commonly known as:  GARAMYCIN Apply 1 application topically daily.   insulin aspart 100 UNIT/ML FlexPen Commonly known as:  NOVOLOG FLEXPEN Inject 20 units 15 minutes before your two largest meals of the day   Insulin Detemir 100 UNIT/ML Pen Commonly known as:  LEVEMIR FLEXPEN Inject 30 Units into the skin 2 (two) times daily.   mupirocin cream 2 % Commonly known as:  BACTROBAN Apply 1 application topically 2 (two) times daily.   pravastatin 20 MG tablet Commonly known as:  PRAVACHOL Take 1 tablet (20 mg total) by mouth daily.            Durable Medical Equipment        Start     Ordered   11/26/16 1140  For home use only DME wheelchair  cushion (seat and back)  (Wheelchairs)  Once     11/26/16 1140       Discharge Care Instructions        Start     Ordered   11/26/16 0000  Non weight bearing    Question Answer Comment  Laterality left   Extremity Lower      11/26/16 0646   11/26/16 0000  Home Health  (Home health needs / face to face )    Question:  To provide the following care/treatments  Answer:  RN   11/26/16 1130   11/26/16 0000  Increase activity slowly     11/26/16 1140   11/26/16 0000  Diet - low sodium heart healthy     11/26/16 1140   11/26/16 0000  Call MD for:  temperature >100.4     11/26/16 1140   11/26/16 0000  Call MD for:  severe uncontrolled pain     11/26/16 1140   11/26/16 0000  Call MD for:  persistant nausea and vomiting     11/26/16 1140   11/26/16 0000  Call MD for:  redness, tenderness, or signs of infection (pain, swelling, redness, odor or green/yellow discharge around incision site)     11/26/16 1140   11/26/16 0000  Call MD for:  difficulty breathing, headache or visual disturbances     11/26/16 1140  11/26/16 0000  doxycycline (VIBRA-TABS) 100 MG tablet  Every 12 hours    Comments:  Please discontinue please 14 day course of doxy for this patient   11/26/16 1147   11/26/16 0000  amoxicillin-clavulanate (AUGMENTIN) 875-125 MG tablet  2 times daily    Comments:  Please discontinue the previous Augmentin prescription for this patient.   11/26/16 1147   11/26/16 0000  Face-to-face encounter (required for Medicare/Medicaid patients)  (Home health needs / face to face )    Comments:  I Kathi Ludwig certify that this patient is under my care and that I, or a nurse practitioner or physician's assistant working with me, had a face-to-face encounter that meets the physician face-to-face encounter requirements with this patient on 11/26/2016. The encounter with the patient was in whole, or in part for the following medical condition(s) which is the primary reason for home health care  (List medical condition): Cellulitis of the left foot secondary to improper wound care, s/p amputation of the left great toe, multiple metatarsal fractures secondary to poor wound care, poorly controlled insulin dependent diabetes, wound care non-compliance, non-weightbearing left lower extremity,  Question Answer Comment  The encounter with the patient was in whole, or in part, for the following medical condition, which is the primary reason for home health care Surgical removal of left great toe, non-healing wound, uncontrolled diabetic, failed outpatient therapy previously   I certify that, based on my findings, the following services are medically necessary home health services Nursing   Reason for Medically [place] [facility]- Assessment and Observation of Wound Status   My clinical findings support the need for the above services OTHER SEE COMMENTS   Further, I certify that my clinical findings support that this patient is homebound due to: Pain interferes with ambulation/mobility      11/26/16 1228   11/26/16 0000  Wheelchair     11/26/16 1228      Disposition and follow-up:   Mr.Deshawn Cozzolino was discharged from [facility] in Good condition.  At the hospital follow up visit please address:  1.  Wound care, blood glucose monitoring, tighter glycemic control   2.  Labs / imaging needed at time of follow-up: CBG, wound evaluation  3.  Pending labs/ test needing follow-up: n/a  Follow-up Appointments: Follow-up Information    Newt Minion, MD Follow up in 1 week(s).   Specialty:  Orthopedic Surgery Contact information: [place] [address] [phone]        Ladell Pier, MD. Go on 11/29/2016.   Specialty:  Internal Medicine Why:  at 4:15pm for an appointment with Dr Wynetta Emery.  Contact information: [place] [address] [place] Hospital Course by problem  list: Active Problems:   Poorly controlled type 2 diabetes mellitus with complication (HCC)   Cellulitis of foot, left   Diabetic neuropathy (HCC)   Fracture of 2nd metatarsal   Fracture of 3rd metatarsal   Postprocedural non-healing wound   1. Cellulitis of left foot Patient presented with signs of erythema, swelling, drainage, and mild tenderness to the postoperative surgical site status post amputation of the left great toe on a prior admission less than three weeks past. Patient stated that he was non-compliant with his doxycycline taking it only once daily, sometimes both tablets simultaneously and potentially missing doses as well. In addition, he has not followed direction to keep  the left foot non-weightbearing, opting instead to use his leg normally. As such, the patient presented with a non-healing wound, with multiple fractures(see below) and signs of cellulitis(erythema, edema, drainage). Orthopedics was consulted who opted to treat the patient with Vancomycin and Zosyn initially and to deescalate to doxycycline as the patient's cellulitis resolved. By discharge, there was minimal signs of infection dorsally with only minor areas of redness laterally, however no drainage was at this time observed. He was discharged on doxycycline 100mg  BID and Agumentin 875mg  BID.  2. Postprocedural non-healing wound with fracture of 2nd & 3rd metatarsals  As noted above, radiographs indicated destructive changes most likely secondary to his continued use of the limb, his poorly controlled diabetes, and his diabetic neuropathy. Patient was advised to refrain from using his limb. A wheelchair was given to the patient prior to discharge and Home Health was established with the patient to assist him with wound care and to initially observe the wound on a daily basis and then weekly as the patient progressively identified that he can care for his foot properly.   3. Diabetic neuropathy Patient was treated  with his home dose of gabapentin 300mg  TID. He denied concerns during his stay and was discharged home on the same dose.   4. Poorly controlled type 2 diabetes mellitus with complication Patient's blood glucose was controlled with Novolog 12 units with meals and a moderate sliding scale as well as Levemir 25 units twice daily. His CBG levels ranged from 64 to 317. Patient was discharged home on his Levemir 30 units two times daily and Novolog 20 units before his two largest meals. Recommend continued outpatient care with regard to the blood glucose monitoring, patient compliance with monitoring and treatment. Patient stated that he may not check a blood glucose level for several days.   Patient was advised to return to the ED or to call his physician, if at anytime he develops symptoms concerning of infection: Including but not limited to, fever, leg pain, blood streaking up his leg,diarrhea, constipation, and other symptoms such as chest pain, abdominal pain, visual changes, or any other concerning symptoms.   Discharge Vitals:   BP 139/61 (BP Location: Right Arm)   Pulse 85   Temp 98.5 F (36.9 C) (Oral)   Resp 18   Ht 5\' 7"  (1.702 m)   Wt 212 lb 4.8 oz (96.3 kg)   SpO2 100%   BMI 33.25 kg/m   Pertinent Labs, Studies, and Procedures:  CBC Latest Ref Rng & Units 11/24/2016 11/23/2016 11/22/2016  WBC 4.0 - 10.5 K/uL 8.6 8.4 11.5(H)  Hemoglobin 13.0 - 17.0 g/dL 10.9(L) 10.1(L) 11.3(L)  Hematocrit 39.0 - 52.0 % 33.4(L) 30.9(L) 34.3(L)  Platelets 150 - 400 K/uL 271 262 342   CMP Latest Ref Rng & Units 11/24/2016 11/23/2016 11/22/2016  Glucose 65 - 99 mg/dL 142(H) 194(H) 238(H)  BUN 6 - 20 mg/dL 11 13 15   Creatinine 0.61 - 1.24 mg/dL 0.96 1.02 1.02  Sodium 135 - 145 mmol/L 137 139 138  Potassium 3.5 - 5.1 mmol/L 3.4(L) 3.5 3.4(L)  Chloride 101 - 111 mmol/L 101 106 106  CO2 22 - 32 mmol/L 30 27 22   Calcium 8.9 - 10.3 mg/dL 9.1 8.8(L) 9.0  Total Protein 6.5 - 8.1 g/dL - - 7.1  Total Bilirubin 0.3 -  1.2 mg/dL - - 0.5  Alkaline Phos 38 - 126 U/L - - 106  AST 15 - 41 U/L - - 16  ALT 17 - 63  U/L - - 14(L)    Discharge Instructions: Discharge Instructions    Call MD for:  difficulty breathing, headache or visual disturbances    Complete by:  As directed    Call MD for:  persistant nausea and vomiting    Complete by:  As directed    Call MD for:  redness, tenderness, or signs of infection (pain, swelling, redness, odor or green/yellow discharge around incision site)    Complete by:  As directed    Call MD for:  severe uncontrolled pain    Complete by:  As directed    Call MD for:  temperature >100.4    Complete by:  As directed    Diet - low sodium heart healthy    Complete by:  As directed    Face-to-face encounter (required for Medicare/Medicaid patients)    Complete by:  As directed    I Kathi Ludwig certify that this patient is under my care and that I, or a nurse practitioner or physician's assistant working with me, had a face-to-face encounter that meets the physician face-to-face encounter requirements with this patient on 11/26/2016. The encounter with the patient was in whole, or in part for the following medical condition(s) which is the primary reason for home health care (List medical condition): Cellulitis of the left foot secondary to improper wound care, s/p amputation of the left great toe, multiple metatarsal fractures secondary to poor wound care, poorly controlled insulin dependent diabetes, wound care non-compliance, non-weightbearing left lower extremity,   The encounter with the patient was in whole, or in part, for the following medical condition, which is the primary reason for home health care:  Surgical removal of left great toe, non-healing wound, uncontrolled diabetic, failed outpatient therapy previously   I certify that, based on my findings, the following services are medically necessary home health services:  Nursing   Reason for Medically Necessary Home  Health Services:  Skilled Nursing- Assessment and Observation of Wound Status   My clinical findings support the need for the above services:  OTHER SEE COMMENTS   Further, I certify that my clinical findings support that this patient is homebound due to:  Pain interferes with ambulation/mobility   Please observe the patient once weekly for wound dressing changes, to ensure that the patient is compliant and understands the process thoroughly. Patient has failed to completely understand the need for adequate bandage changes and wound care. Please reinforce the need to restrict use of the appendage as well as to keep it clean, dry, and free from contact with surroundings.  Standard post surgical bandage changes are appropriate. Hydrogel with gauze, and outer ACE wrap acceptable to be changed daily. Thank you   Home Health    Complete by:  As directed    To provide the following care/treatments:  RN   Increase activity slowly    Complete by:  As directed    Non weight bearing    Complete by:  As directed    Laterality:  left   Extremity:  Lower   Wheelchair    Complete by:  As directed      Signed: Kathi Ludwig, MD 11/26/2016, 12:33 PM   Pager: Pager# [phone]

## 2016-11-26 NOTE — Discharge Instructions (Signed)
Please be sure to follow-up with your PCP and Dr. Sharol Given. It is essential that you keep those appointments so that they may appropriately monitor your care.  Please continue to take you insulin daily as prescribed. The better you control your blood glucose levels, the faster you will heal and recover.  Please take your medications as prescribed. The Doxycycline must be taken two times daily, as well as the Augmentin. That is two times daily, typically twelve hours apart.   You must remmeber to keep off of that foot. Your left foot is to be non-weightbearing until otherwise told different by your orthopedic doctor. That is a must if it is to heal.   If at any time you were to develop severe shortness of breath, fever, blood streaking up your leg, redness, drainage from the wound or other concerning symptom, please do not hesitate to call your doctors or return the the emergency department.   Thank you for your visit to [facility].

## 2016-11-26 NOTE — Hospital Discharge Follow-Up (Signed)
The patient is known to the [facility] and a hospital follow up appointment has been scheduled for 11/29/16 @ 1615 and the information was placed on the AVS.  This CM spoke to Jacqualin Combes, RN CM to request home health follow up for the patient to assist with dressing changes to insure  he understands how to do the wound care and to also assist with medication management.

## 2016-11-26 NOTE — Telephone Encounter (Signed)
Completed SCAT application faxed to [employer] eligibility.  

## 2016-11-26 NOTE — Discharge Summary (Signed)
Name: [name] [name] MRN: [id number] DOB: [birth year] 31 y.o. PCP: Ladell Pier, MD  Date of Admission: 11/22/2016  1:47 PM Date of Discharge: 11/26/2016 Attending Physician: Lucious Groves, DO  Discharge Diagnosis: Active Problems:   Poorly controlled type 2 diabetes mellitus with complication ([facility])   Cellulitis of foot, left   Diabetic neuropathy ([place])   Fracture of 2nd metatarsal   Fracture of 3rd metatarsal   Postprocedural non-healing wound   Discharge Medications: Allergies as of 11/26/2016   No Known Allergies     Medication List    TAKE these medications   acetaminophen 325 MG tablet Commonly known as:  TYLENOL Take 2 tablets (650 mg total) by mouth every 6 (six) hours as needed for mild pain or fever (for pain).   amoxicillin-clavulanate 875-125 MG tablet Commonly known as:  AUGMENTIN Take 1 tablet by mouth 2 (two) times daily.   doxycycline 100 MG tablet Commonly known as:  VIBRA-TABS Take 1 tablet (100 mg total) by mouth every 12 (twelve) hours.   escitalopram 20 MG tablet Commonly known as:  LEXAPRO Take 1 tablet (20 mg total) by mouth at bedtime.   gabapentin 300 MG capsule Commonly known as:  NEURONTIN Take 1 capsule (300 mg total) by mouth 3 (three) times daily.   gentamicin cream 0.1 % Commonly known as:  GARAMYCIN Apply 1 application topically daily.   insulin aspart 100 UNIT/ML FlexPen Commonly known as:  NOVOLOG FLEXPEN Inject 20 units 15 minutes before your two largest meals of the day   Insulin Detemir 100 UNIT/ML Pen Commonly known as:  LEVEMIR FLEXPEN Inject 30 Units into the skin 2 (two) times daily.   mupirocin cream 2 % Commonly known as:  BACTROBAN Apply 1 application topically 2 (two) times daily.   pravastatin 20 MG tablet Commonly known as:  PRAVACHOL Take 1 tablet (20 mg total) by mouth daily.            Durable Medical Equipment        Start     Ordered   11/26/16 1140  For home use only DME wheelchair  cushion (seat and back)  (Wheelchairs)  Once     11/26/16 1140       Discharge Care Instructions        Start     Ordered   11/26/16 0000  Non weight bearing    Question Answer Comment  Laterality left   Extremity Lower      11/26/16 0646   11/26/16 0000  Home Health  (Home health needs / face to face )    Question:  To provide the following care/treatments  Answer:  RN   11/26/16 1130   11/26/16 0000  Increase activity slowly     11/26/16 1140   11/26/16 0000  Diet - low sodium heart healthy     11/26/16 1140   11/26/16 0000  Call MD for:  temperature >100.4     11/26/16 1140   11/26/16 0000  Call MD for:  severe uncontrolled pain     11/26/16 1140   11/26/16 0000  Call MD for:  persistant nausea and vomiting     11/26/16 1140   11/26/16 0000  Call MD for:  redness, tenderness, or signs of infection (pain, swelling, redness, odor or green/yellow discharge around incision site)     11/26/16 1140   11/26/16 0000  Call MD for:  difficulty breathing, headache or visual disturbances     11/26/16 1140  11/26/16 0000  doxycycline (VIBRA-TABS) 100 MG tablet  Every 12 hours    Comments:  Please discontinue please 14 day course of doxy for this patient   11/26/16 1147   11/26/16 0000  amoxicillin-clavulanate (AUGMENTIN) 875-125 MG tablet  2 times daily    Comments:  Please discontinue the previous Augmentin prescription for this patient.   11/26/16 1147   11/26/16 0000  Face-to-face encounter (required for Medicare/Medicaid patients)  (Home health needs / face to face )    Comments:  I Kathi Ludwig certify that this patient is under my care and that I, or a nurse practitioner or physician's assistant working with me, had a face-to-face encounter that meets the physician face-to-face encounter requirements with this patient on 11/26/2016. The encounter with the patient was in whole, or in part for the following medical condition(s) which is the primary reason for home health care  (List medical condition): Cellulitis of the left foot secondary to improper wound care, s/p amputation of the left great toe, multiple metatarsal fractures secondary to poor wound care, poorly controlled insulin dependent diabetes, wound care non-compliance, non-weightbearing left lower extremity,  Question Answer Comment  The encounter with the patient was in whole, or in part, for the following medical condition, which is the primary reason for home health care Surgical removal of left great toe, non-healing wound, uncontrolled diabetic, failed outpatient therapy previously   I certify that, based on my findings, the following services are medically necessary home health services Nursing   Reason for Medically [facility]- Assessment and Observation of Wound Status   My clinical findings support the need for the above services OTHER SEE COMMENTS   Further, I certify that my clinical findings support that this patient is homebound due to: Pain interferes with ambulation/mobility      11/26/16 1228   11/26/16 0000  Wheelchair     11/26/16 1228      Disposition and follow-up:   Mr.Ahmar Nehring was discharged from [facility] in Good condition.  At the hospital follow up visit please address:  1.  Wound care, blood glucose monitoring, tighter glycemic control   2.  Labs / imaging needed at time of follow-up: CBG, wound evaluation  3.  Pending labs/ test needing follow-up: n/a  Follow-up Appointments: Follow-up Information    Newt Minion, MD Follow up in 1 week(s).   Specialty:  Orthopedic Surgery Contact information: [place] [address] [phone]        Ladell Pier, MD. Go on 11/29/2016.   Specialty:  Internal Medicine Why:  at 4:15pm for an appointment with Dr Wynetta Emery.  Contact information: [place] [address] [place] Hospital Course by problem  list: Active Problems:   Poorly controlled type 2 diabetes mellitus with complication ([facility])   Cellulitis of foot, left   Diabetic neuropathy ([facility])   Fracture of 2nd metatarsal   Fracture of 3rd metatarsal   Postprocedural non-healing wound   1. Cellulitis of left foot Patient presented with signs of erythema, swelling, drainage, and mild tenderness to the postoperative surgical site status post amputation of the left great toe on a prior admission less than three weeks past. Patient stated that he was non-compliant with his doxycycline taking it only once daily, sometimes both tablets simultaneously and potentially missing doses as well. In addition, he has not followed direction to keep  the left foot non-weightbearing, opting instead to use his leg normally. As such, the patient presented with a non-healing wound, with multiple fractures(see below) and signs of cellulitis(erythema, edema, drainage). Orthopedics was consulted who opted to treat the patient with Vancomycin and Zosyn initially and to deescalate to doxycycline as the patient's cellulitis resolved. By discharge, there was minimal signs of infection dorsally with only minor areas of redness laterally, however no drainage was at this time observed. He was discharged on doxycycline 100mg  BID and Agumentin 875mg  BID.  2. Postprocedural non-healing wound with fracture of 2nd & 3rd metatarsals  As noted above, radiographs indicated destructive changes most likely secondary to his continued use of the limb, his poorly controlled diabetes, and his diabetic neuropathy. Patient was advised to refrain from using his limb. A wheelchair was given to the patient prior to discharge and Home Health was established with the patient to assist him with wound care and to initially observe the wound on a daily basis and then weekly as the patient progressively identified that he can care for his foot properly.   3. Diabetic neuropathy Patient was treated  with his home dose of gabapentin 300mg  TID. He denied concerns during his stay and was discharged home on the same dose.   4. Poorly controlled type 2 diabetes mellitus with complication Patient's blood glucose was controlled with Novolog 12 units with meals and a moderate sliding scale as well as Levemir 25 units twice daily. His CBG levels ranged from 64 to 317. Patient was discharged home on his Levemir 30 units two times daily and Novolog 20 units before his two largest meals. Recommend continued outpatient care with regard to the blood glucose monitoring, patient compliance with monitoring and treatment. Patient stated that he may not check a blood glucose level for several days.   Patient was advised to return to the ED or to call his physician, if at anytime he develops symptoms concerning of infection: Including but not limited to, fever, leg pain, blood streaking up his leg,diarrhea, constipation, and other symptoms such as chest pain, abdominal pain, visual changes, or any other concerning symptoms.   Discharge Vitals:   BP 139/61 (BP Location: Right Arm)   Pulse 85   Temp 98.5 F (36.9 C) (Oral)   Resp 18   Ht 5\' 7"  (1.702 m)   Wt 212 lb 4.8 oz (96.3 kg)   SpO2 100%   BMI 33.25 kg/m   Pertinent Labs, Studies, and Procedures:  CBC Latest Ref Rng & Units 11/24/2016 11/23/2016 11/22/2016  WBC 4.0 - 10.5 K/uL 8.6 8.4 11.5(H)  Hemoglobin 13.0 - 17.0 g/dL 10.9(L) 10.1(L) 11.3(L)  Hematocrit 39.0 - 52.0 % 33.4(L) 30.9(L) 34.3(L)  Platelets 150 - 400 K/uL 271 262 342   CMP Latest Ref Rng & Units 11/24/2016 11/23/2016 11/22/2016  Glucose 65 - 99 mg/dL 142(H) 194(H) 238(H)  BUN 6 - 20 mg/dL 11 13 15   Creatinine 0.61 - 1.24 mg/dL 0.96 1.02 1.02  Sodium 135 - 145 mmol/L 137 139 138  Potassium 3.5 - 5.1 mmol/L 3.4(L) 3.5 3.4(L)  Chloride 101 - 111 mmol/L 101 106 106  CO2 22 - 32 mmol/L 30 27 22   Calcium 8.9 - 10.3 mg/dL 9.1 8.8(L) 9.0  Total Protein 6.5 - 8.1 g/dL - - 7.1  Total Bilirubin 0.3 -  1.2 mg/dL - - 0.5  Alkaline Phos 38 - 126 U/L - - 106  AST 15 - 41 U/L - - 16  ALT 17 - 63  U/L - - 14(L)    Discharge Instructions: Discharge Instructions    Call MD for:  difficulty breathing, headache or visual disturbances    Complete by:  As directed    Call MD for:  persistant nausea and vomiting    Complete by:  As directed    Call MD for:  redness, tenderness, or signs of infection (pain, swelling, redness, odor or green/yellow discharge around incision site)    Complete by:  As directed    Call MD for:  severe uncontrolled pain    Complete by:  As directed    Call MD for:  temperature >100.4    Complete by:  As directed    Diet - low sodium heart healthy    Complete by:  As directed    Face-to-face encounter (required for Medicare/Medicaid patients)    Complete by:  As directed    I Kathi Ludwig certify that this patient is under my care and that I, or a nurse practitioner or physician's assistant working with me, had a face-to-face encounter that meets the physician face-to-face encounter requirements with this patient on 11/26/2016. The encounter with the patient was in whole, or in part for the following medical condition(s) which is the primary reason for home health care (List medical condition): Cellulitis of the left foot secondary to improper wound care, s/p amputation of the left great toe, multiple metatarsal fractures secondary to poor wound care, poorly controlled insulin dependent diabetes, wound care non-compliance, non-weightbearing left lower extremity,   The encounter with the patient was in whole, or in part, for the following medical condition, which is the primary reason for home health care:  Surgical removal of left great toe, non-healing wound, uncontrolled diabetic, failed outpatient therapy previously   I certify that, based on my findings, the following services are medically necessary home health services:  Nursing   Reason for Medically Necessary Home  Health Services:  Skilled Nursing- Assessment and Observation of Wound Status   My clinical findings support the need for the above services:  OTHER SEE COMMENTS   Further, I certify that my clinical findings support that this patient is homebound due to:  Pain interferes with ambulation/mobility   Please observe the patient once weekly for wound dressing changes, to ensure that the patient is compliant and understands the process thoroughly. Patient has failed to completely understand the need for adequate bandage changes and wound care. Please reinforce the need to restrict use of the appendage as well as to keep it clean, dry, and free from contact with surroundings.  Standard post surgical bandage changes are appropriate. Hydrogel with gauze, and outer ACE wrap acceptable to be changed daily. Thank you   Home Health    Complete by:  As directed    To provide the following care/treatments:  RN   Increase activity slowly    Complete by:  As directed    Non weight bearing    Complete by:  As directed    Laterality:  left   Extremity:  Lower   Wheelchair    Complete by:  As directed      Signed: Kathi Ludwig, MD 11/26/2016, 12:33 PM   Pager: Pager# [phone]

## 2016-11-27 LAB — CULTURE, BLOOD (ROUTINE X 2)
CULTURE: NO GROWTH
Culture: NO GROWTH
SPECIAL REQUESTS: ADEQUATE
SPECIAL REQUESTS: ADEQUATE

## 2016-11-29 ENCOUNTER — Inpatient Hospital Stay: Payer: Self-pay | Admitting: Internal Medicine

## 2016-11-29 ENCOUNTER — Ambulatory Visit: Payer: Self-pay | Admitting: Internal Medicine

## 2016-12-03 ENCOUNTER — Telehealth: Payer: Self-pay

## 2016-12-03 NOTE — Telephone Encounter (Signed)
Attempted to contact the patient to check on him and to  discuss scheduling a follow up appointment. Call placed to # [phone] (H) and a HIPAA compliant voicemail message was left requesting a call back to # [phone].  Call placed to [facility] to inquire if the patient has been seen by the nurse. Spoke to [place] who stated that that patient was seen on 11/30/16 and has been  discharged from services noting that the patient has moved to [place].

## 2016-12-18 ENCOUNTER — Telehealth: Payer: Self-pay | Admitting: Internal Medicine

## 2016-12-18 NOTE — Telephone Encounter (Signed)
Call placed to patient [phone] in regards to his [employer] application. No answer. Left patient a message to return my call [phone]. Wanted to ask patient if he had received a call from [facility] regarding scheduling an appointment for his evaluation.

## 2017-01-11 ENCOUNTER — Telehealth: Payer: Self-pay | Admitting: Internal Medicine

## 2017-01-11 NOTE — Telephone Encounter (Signed)
Called placed to patient # [phone], to inquire if patient was approved for [facility]. No answer. Left patient a message to return my call at [phone].

## 2017-02-05 ENCOUNTER — Telehealth: Payer: Self-pay | Admitting: Internal Medicine

## 2017-02-05 NOTE — Telephone Encounter (Signed)
Call placed to patient  [phone] to check on his status and follow up on [employer] application. No answer. Left message on phone asking him to return my call.   Call placed to [employer] [phone] to check on status of patient's application but recording came out stating that office is closed.

## 2017-02-06 ENCOUNTER — Telehealth: Payer: Self-pay

## 2017-02-06 NOTE — Telephone Encounter (Signed)
Call placed to [employer] [phone] to check on the status of patient's application. Spoke with Kennyth Lose and she informed me that patient's application was received but didn't know why it was on hold. Kennyth Lose transferred me to [employer].  Spoke with [employer] and she informed me that she had to speak with the person who had started working on patient's application to find out why his application was put on hold. I gave Candice my information and she stated that she would return my call.

## 2017-02-06 NOTE — Telephone Encounter (Signed)
Call received form Anderson [place] with [employer].  She said that they have not heard back from him since they attempted to reach him on 11/30/16.  Call then received from [place] with [employer]. She stated that they tried to contact the patient at the numbers provided and were told by the person who answered the phone that the patient is no longer in [place].  She stated that they will send a letter to the address on file noting that they will discontinue processing the request because they were notified that he is no longer in [place].  [facility] has no other contact #s for the patient

## 2018-09-20 IMAGING — CT CT ABD-PELV W/ CM
2 of 3 series · 16 of 46 positions shown, 18 images · IV contrast (ISOVUE)
Comparison: None.

CLINICAL DATA: Pain bold bold over the tail bone bold bold with
onset 2 weeks ago.

EXAM:
CT ABDOMEN AND PELVIS WITH CONTRAST
TECHNIQUE: Multidetector CT imaging of the abdomen and pelvis was performed
using the standard protocol following bolus administration of
intravenous contrast.
CONTRAST:  100mL 0MYVVE-Q11 IOPAMIDOL (0MYVVE-Q11) INJECTION 61%

[Series 2: abd/pel with · axial · 0.89mm/px · z∈[-400,+24]mm · 13 of 99 slices shown, 15 images]
[im 7/99  soft-tissue]
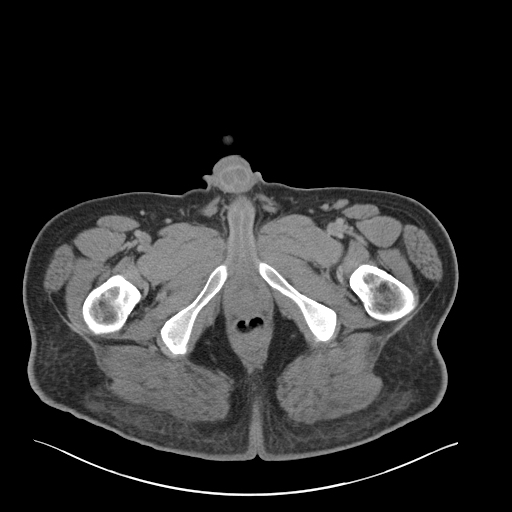
[im 7/99  bone]
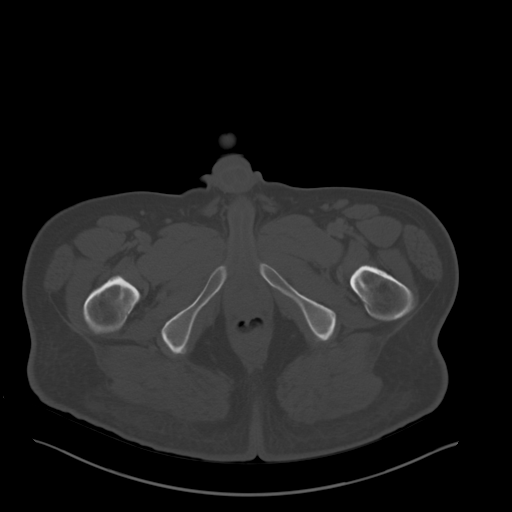
[im 13/99  soft-tissue]
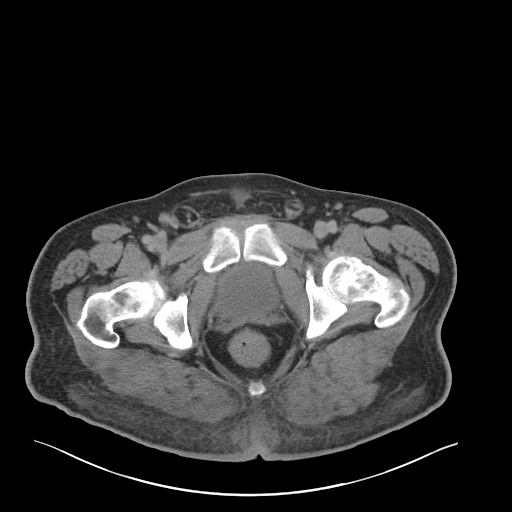
[im 19/99  soft-tissue]
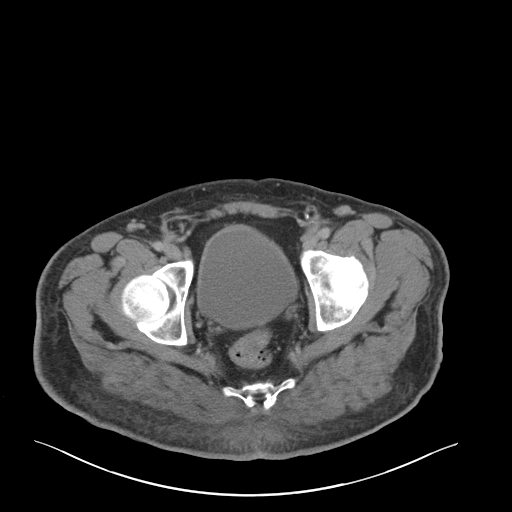
[im 29/99  soft-tissue]
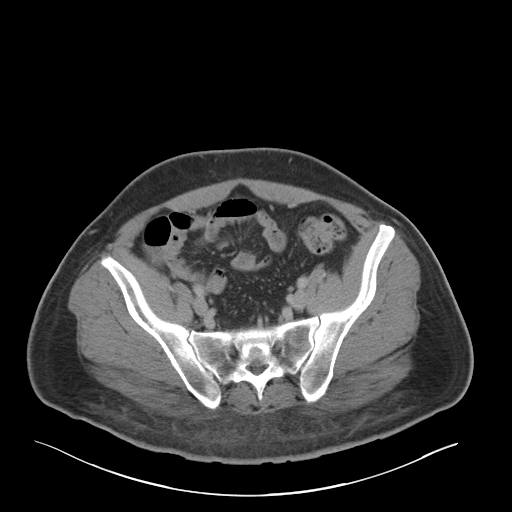
[im 35/99  soft-tissue]
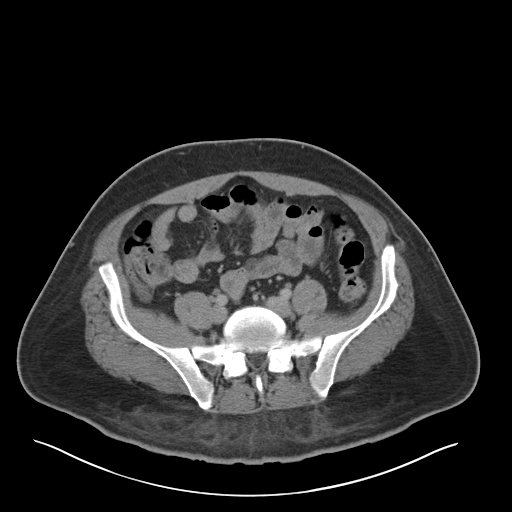
[im 42/99  soft-tissue]
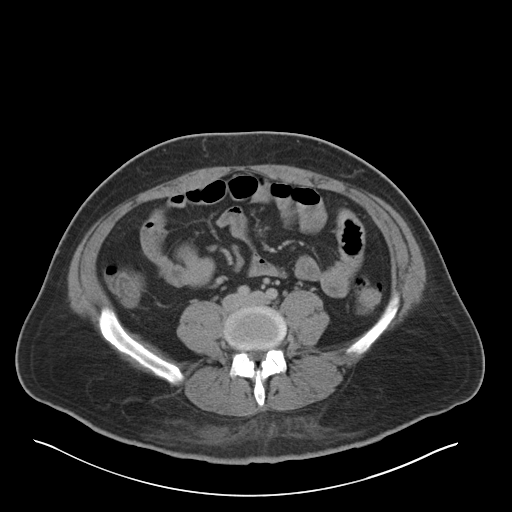
[im 51/99  soft-tissue]
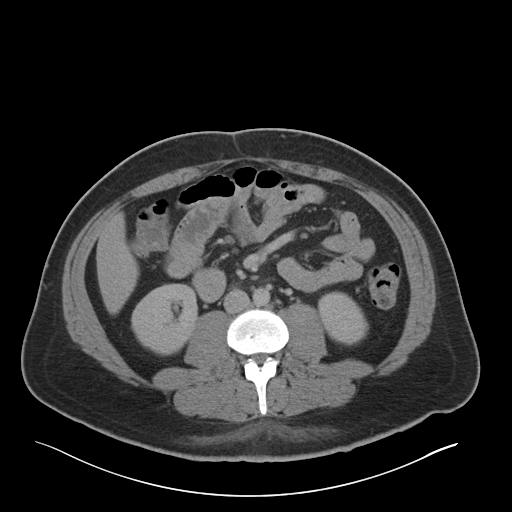
[im 57/99  soft-tissue]
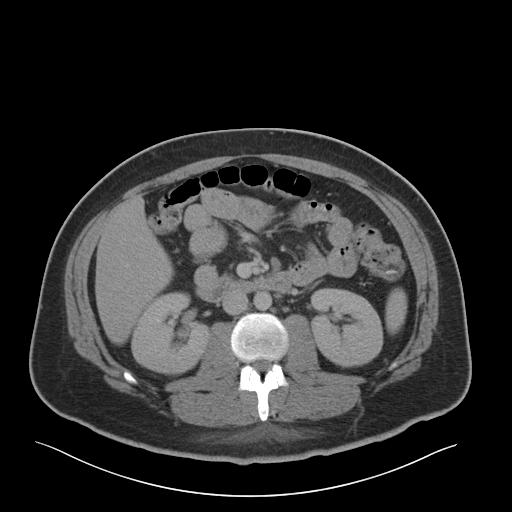
[im 64/99  soft-tissue]
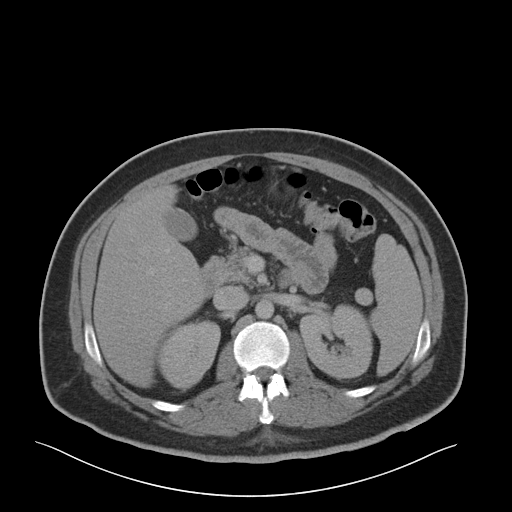
[im 64/99  bone]
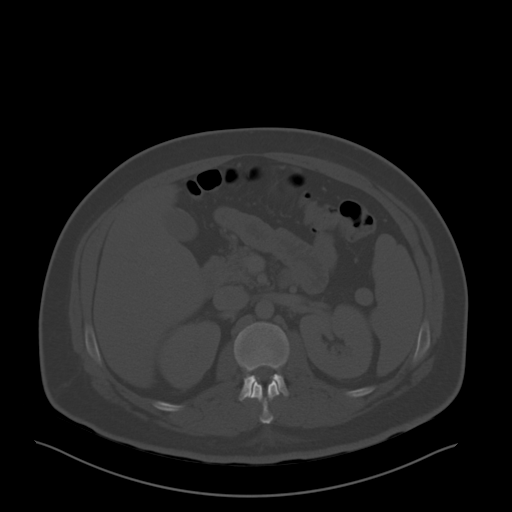
[im 70/99  soft-tissue]
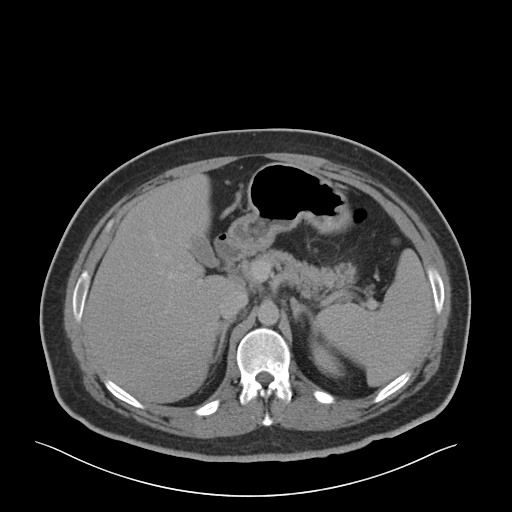
[im 80/99  soft-tissue]
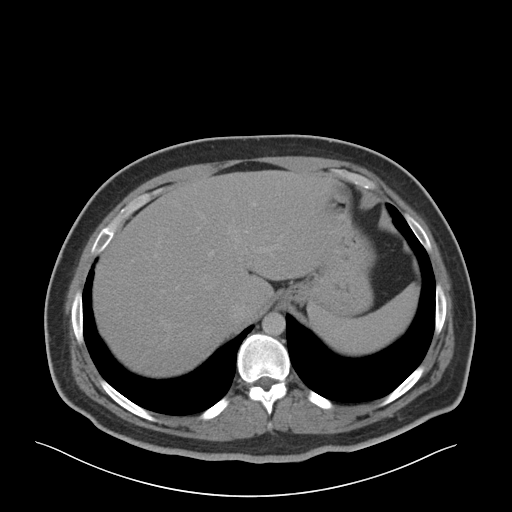
[im 86/99  soft-tissue]
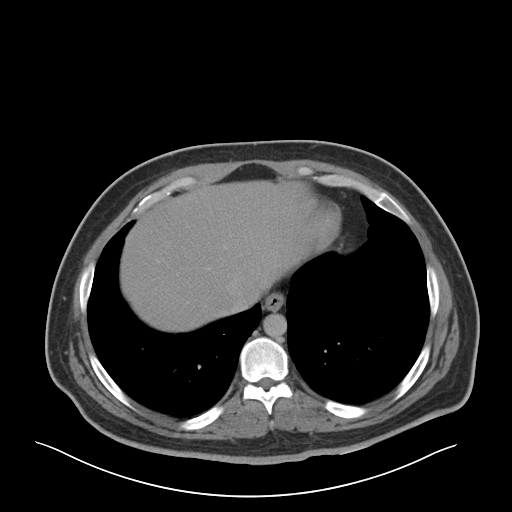
[im 92/99  soft-tissue]
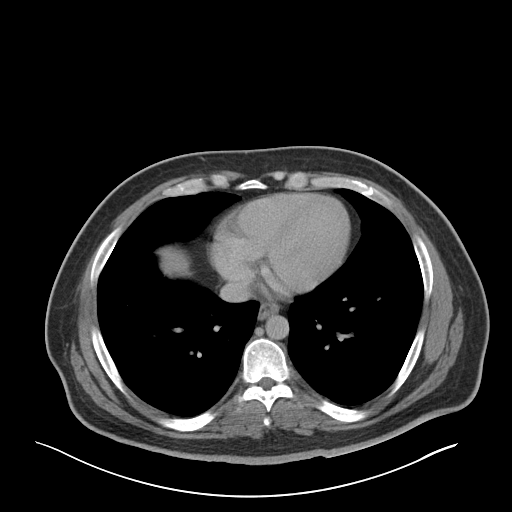

[Series 3: coronal a/|p · coronal · 0.83mm/px · 3 of 176 slices shown]
[im 59/176  soft-tissue]
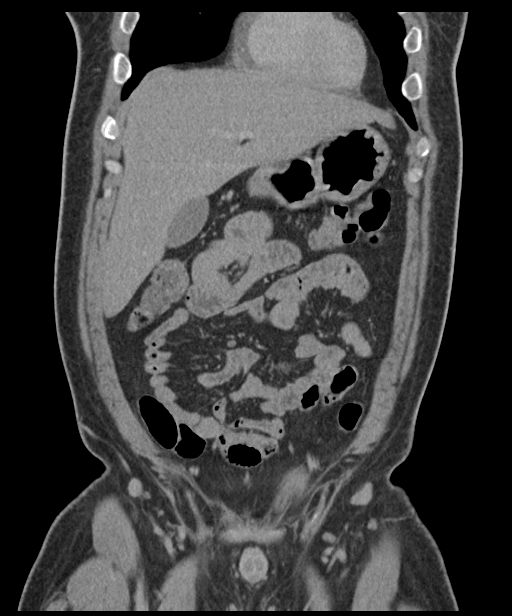
[im 78/176  soft-tissue]
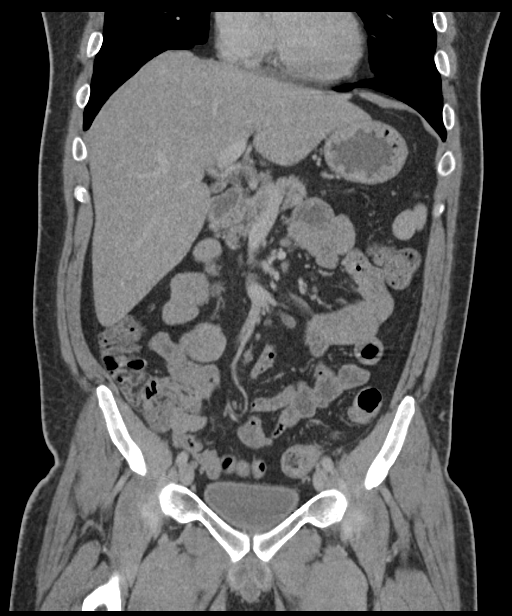
[im 98/176  soft-tissue]
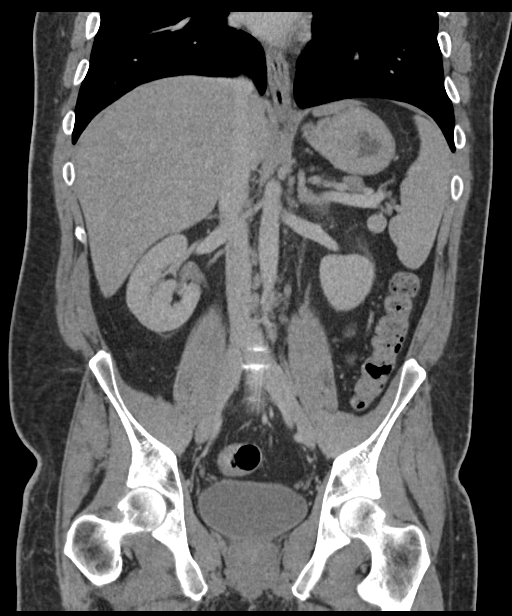

[16 of 46 positions shown; findings below may reference images not displayed]

FINDINGS: LOWER CHEST: Lung bases are clear. Included heart size is normal. No
pericardial effusion.

HEPATOBILIARY: Liver and gallbladder are normal.

PANCREAS: Normal.

SPLEEN: Normal.  Small adjacent splenules are present.

ADRENALS/URINARY TRACT: Kidneys are orthotopic, demonstrating
symmetric enhancement. No nephrolithiasis, hydronephrosis or solid
renal masses. The unopacified ureters are normal in course and
caliber. Urinary bladder is partially distended and unremarkable.
Normal adrenal glands.

STOMACH/BOWEL: The stomach, small and large bowel are normal in
course and caliber without inflammatory changes. Normal appendix.

VASCULAR/LYMPHATIC: Aortoiliac vessels are normal in course and
caliber. No lymphadenopathy by CT size criteria.

REPRODUCTIVE: Normal.

OTHER: No intraperitoneal free fluid or free air.

MUSCULOSKELETAL: There is a small abscess with tiny focus of air
along the upper portion of the [place] cleft consistent with a
pilonidal abscess. Margins are somewhat indistinct but estimated at
3.2 cm in diameter. Adjacent cellulitic soft tissue fatty induration
is seen extending over the gluteal muscles bilaterally. No bone
destruction is seen.
IMPRESSION: 3.2 cm soft tissue abscess along the upper portion of the H[place]a
cleft consistent with a pilonidal abscess. No underlying bony
involvement noted. Subcutaneous fatty inflammation and edema noted
adjacent to this finding bilaterally.

## 2018-09-26 IMAGING — CR DG CHEST 1V PORT
1 series · 1 of 1 positions shown · non-contrast
Comparison: None.

CLINICAL DATA: Weakness and dyspnea.  Sepsis.

EXAM:
PORTABLE CHEST 1 VIEW

[AP]
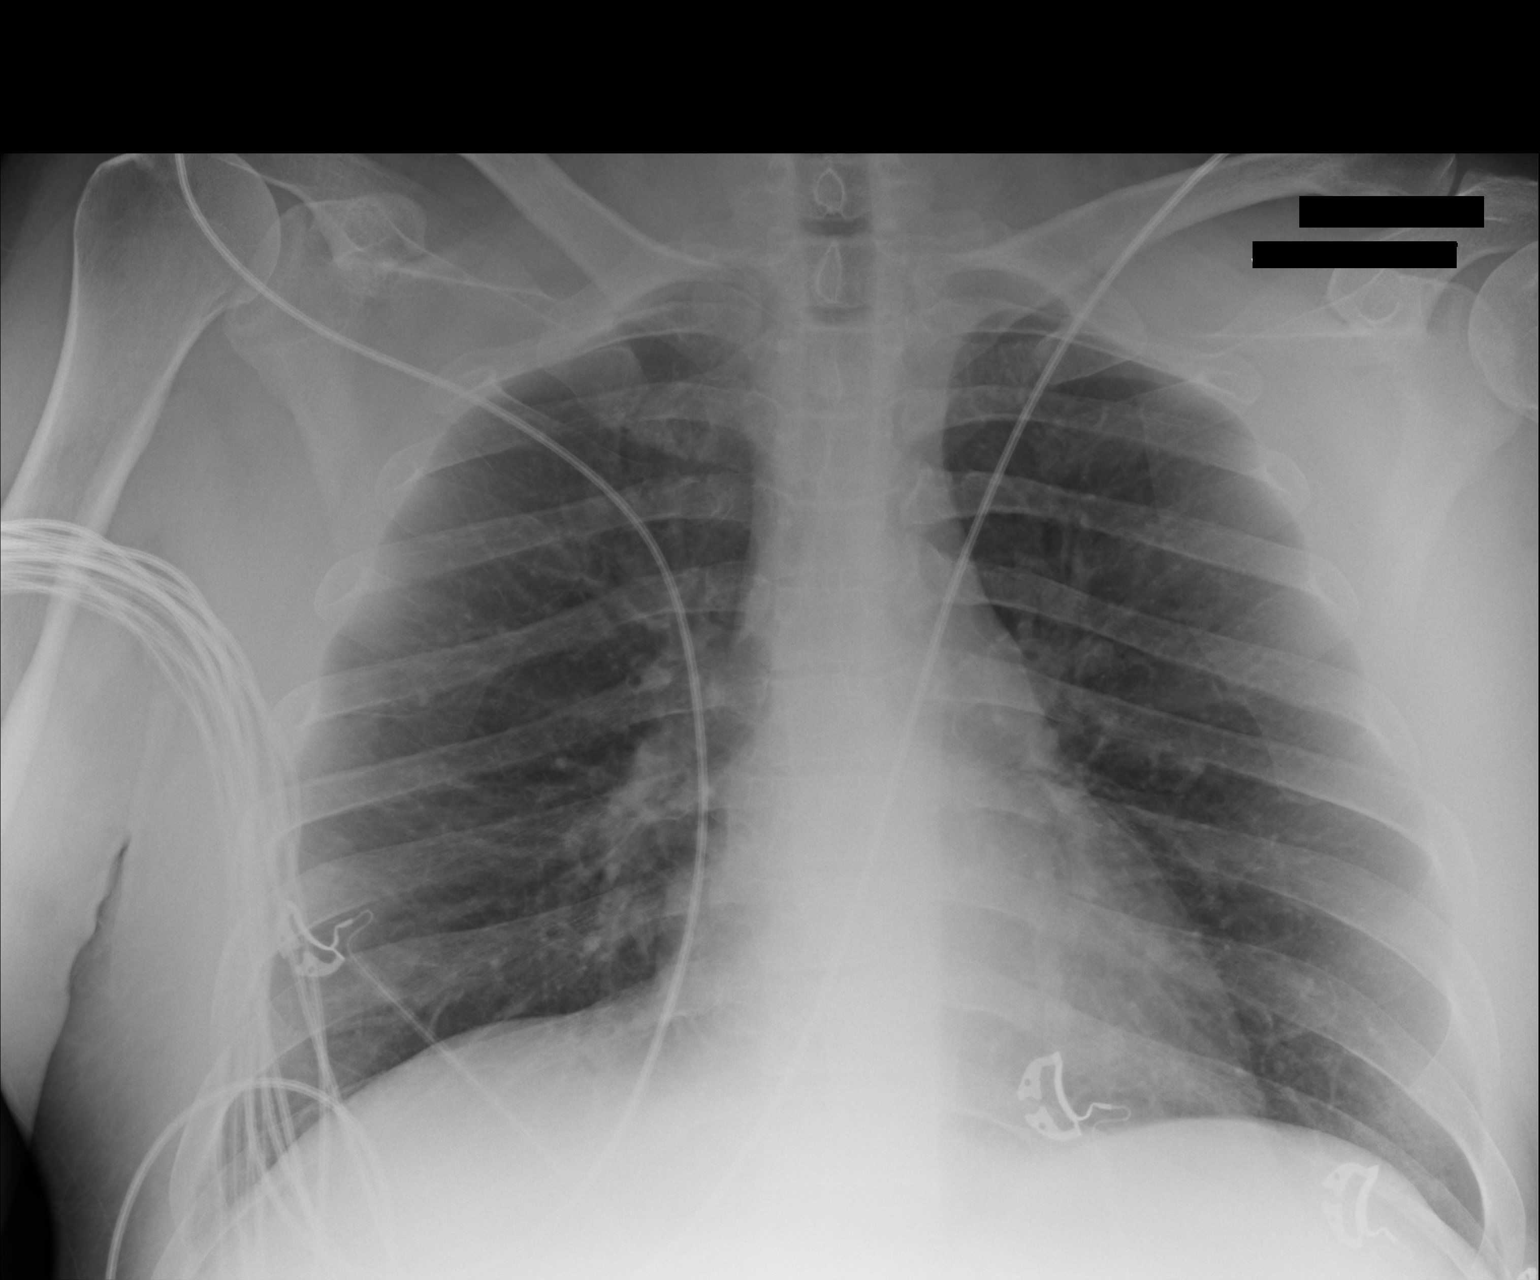

[1 of 1 positions shown; findings below may reference images not displayed]

FINDINGS: A single AP portable view of the chest demonstrates no focal
airspace consolidation or alveolar edema. The lungs are grossly
clear. There is no large effusion or pneumothorax. Cardiac and
mediastinal contours appear unremarkable.
IMPRESSION: No active disease.

## 2019-10-14 ENCOUNTER — Emergency Department (HOSPITAL_COMMUNITY)
Admission: EM | Admit: 2019-10-14 | Discharge: 2019-10-15 | Disposition: A | Discharge status: Home / Self Care | Payer: Medicaid Other | Attending: Emergency Medicine | Admitting: Emergency Medicine

## 2019-10-14 ENCOUNTER — Encounter (HOSPITAL_COMMUNITY): Payer: Self-pay | Admitting: Pediatrics

## 2019-10-14 ENCOUNTER — Other Ambulatory Visit: Payer: Self-pay

## 2019-10-14 ENCOUNTER — Emergency Department (HOSPITAL_COMMUNITY): Payer: Medicaid Other

## 2019-10-14 DIAGNOSIS — R52 Pain, unspecified: Secondary | ICD-10-CM

## 2019-10-14 DIAGNOSIS — Z5321 Procedure and treatment not carried out due to patient leaving prior to being seen by health care provider: Secondary | ICD-10-CM | POA: Diagnosis not present

## 2019-10-14 DIAGNOSIS — W19XXXA Unspecified fall, initial encounter: Secondary | ICD-10-CM | POA: Insufficient documentation

## 2019-10-14 DIAGNOSIS — M25511 Pain in right shoulder: Secondary | ICD-10-CM | POA: Diagnosis not present

## 2019-10-14 NOTE — ED Notes (Signed)
PT was not willing to stay said that he will try to find another hospital.

## 2019-10-14 NOTE — ED Triage Notes (Signed)
Patient reported fell 3 days ago and injured right shoulder; patient hasn't subside since then

## 2019-12-04 ENCOUNTER — Inpatient Hospital Stay (HOSPITAL_COMMUNITY)
Admission: EM | Admit: 2019-12-04 | Discharge: 2019-12-07 | DRG: 623 | Disposition: A | Discharge status: Home / Self Care | Payer: Medicaid Other | Attending: Family Medicine | Admitting: Family Medicine

## 2019-12-04 ENCOUNTER — Encounter (HOSPITAL_COMMUNITY): Payer: Self-pay | Admitting: Emergency Medicine

## 2019-12-04 ENCOUNTER — Emergency Department (HOSPITAL_COMMUNITY): Payer: Medicaid Other

## 2019-12-04 ENCOUNTER — Ambulatory Visit (HOSPITAL_COMMUNITY)
Admission: EM | Admit: 2019-12-04 | Discharge: 2019-12-04 | Disposition: A | Discharge status: Home / Self Care | Payer: Medicaid Other

## 2019-12-04 ENCOUNTER — Other Ambulatory Visit: Payer: Self-pay

## 2019-12-04 DIAGNOSIS — E1165 Type 2 diabetes mellitus with hyperglycemia: Secondary | ICD-10-CM | POA: Diagnosis present

## 2019-12-04 DIAGNOSIS — N179 Acute kidney failure, unspecified: Secondary | ICD-10-CM | POA: Diagnosis present

## 2019-12-04 DIAGNOSIS — F319 Bipolar disorder, unspecified: Secondary | ICD-10-CM | POA: Diagnosis present

## 2019-12-04 DIAGNOSIS — I10 Essential (primary) hypertension: Secondary | ICD-10-CM | POA: Diagnosis present

## 2019-12-04 DIAGNOSIS — D638 Anemia in other chronic diseases classified elsewhere: Secondary | ICD-10-CM | POA: Diagnosis present

## 2019-12-04 DIAGNOSIS — L0291 Cutaneous abscess, unspecified: Secondary | ICD-10-CM

## 2019-12-04 DIAGNOSIS — L0390 Cellulitis, unspecified: Secondary | ICD-10-CM | POA: Diagnosis present

## 2019-12-04 DIAGNOSIS — B951 Streptococcus, group B, as the cause of diseases classified elsewhere: Secondary | ICD-10-CM | POA: Diagnosis present

## 2019-12-04 DIAGNOSIS — E11628 Type 2 diabetes mellitus with other skin complications: Principal | ICD-10-CM | POA: Diagnosis present

## 2019-12-04 DIAGNOSIS — E785 Hyperlipidemia, unspecified: Secondary | ICD-10-CM | POA: Diagnosis present

## 2019-12-04 DIAGNOSIS — Z6835 Body mass index (BMI) 35.0-35.9, adult: Secondary | ICD-10-CM

## 2019-12-04 DIAGNOSIS — L03116 Cellulitis of left lower limb: Secondary | ICD-10-CM | POA: Diagnosis present

## 2019-12-04 DIAGNOSIS — Z89412 Acquired absence of left great toe: Secondary | ICD-10-CM

## 2019-12-04 DIAGNOSIS — Z794 Long term (current) use of insulin: Secondary | ICD-10-CM

## 2019-12-04 DIAGNOSIS — Z20822 Contact with and (suspected) exposure to covid-19: Secondary | ICD-10-CM | POA: Diagnosis present

## 2019-12-04 DIAGNOSIS — L84 Corns and callosities: Secondary | ICD-10-CM | POA: Diagnosis present

## 2019-12-04 DIAGNOSIS — E1140 Type 2 diabetes mellitus with diabetic neuropathy, unspecified: Secondary | ICD-10-CM | POA: Diagnosis present

## 2019-12-04 DIAGNOSIS — Z833 Family history of diabetes mellitus: Secondary | ICD-10-CM

## 2019-12-04 DIAGNOSIS — IMO0002 Reserved for concepts with insufficient information to code with codable children: Secondary | ICD-10-CM

## 2019-12-04 DIAGNOSIS — Z79899 Other long term (current) drug therapy: Secondary | ICD-10-CM

## 2019-12-04 DIAGNOSIS — L02416 Cutaneous abscess of left lower limb: Secondary | ICD-10-CM | POA: Diagnosis present

## 2019-12-04 DIAGNOSIS — Z9114 Patient's other noncompliance with medication regimen: Secondary | ICD-10-CM

## 2019-12-04 LAB — I-STAT VENOUS BLOOD GAS, ED
Acid-Base Excess: 2 mmol/L (ref 0.0–2.0)
Bicarbonate: 27 mmol/L (ref 20.0–28.0)
Calcium, Ion: 1.15 mmol/L (ref 1.15–1.40)
HCT: 30 % — ABNORMAL LOW (ref 39.0–52.0)
Hemoglobin: 10.2 g/dL — ABNORMAL LOW (ref 13.0–17.0)
O2 Saturation: 66 %
Potassium: 3.7 mmol/L (ref 3.5–5.1)
Sodium: 133 mmol/L — ABNORMAL LOW (ref 135–145)
TCO2: 28 mmol/L (ref 22–32)
pCO2, Ven: 41.5 mmHg — ABNORMAL LOW (ref 44.0–60.0)
pH, Ven: 7.421 (ref 7.250–7.430)
pO2, Ven: 34 mmHg (ref 32.0–45.0)

## 2019-12-04 LAB — COMPREHENSIVE METABOLIC PANEL
ALT: 18 U/L (ref 0–44)
AST: 13 U/L — ABNORMAL LOW (ref 15–41)
Albumin: 2.6 g/dL — ABNORMAL LOW (ref 3.5–5.0)
Alkaline Phosphatase: 121 U/L (ref 38–126)
Anion gap: 11 (ref 5–15)
BUN: 17 mg/dL (ref 6–20)
CO2: 25 mmol/L (ref 22–32)
Calcium: 8.8 mg/dL — ABNORMAL LOW (ref 8.9–10.3)
Chloride: 94 mmol/L — ABNORMAL LOW (ref 98–111)
Creatinine, Ser: 1.46 mg/dL — ABNORMAL HIGH (ref 0.61–1.24)
GFR calc Af Amer: >60 mL/min (ref >60)
GFR calc non Af Amer: >60 mL/min (ref >60)
Glucose, Bld: 348 mg/dL — ABNORMAL HIGH (ref 70–99)
Potassium: 3.7 mmol/L (ref 3.5–5.1)
Sodium: 130 mmol/L — ABNORMAL LOW (ref 135–145)
Total Bilirubin: 0.5 mg/dL (ref 0.3–1.2)
Total Protein: 7.1 g/dL (ref 6.5–8.1)

## 2019-12-04 LAB — CBC WITH DIFFERENTIAL/PLATELET
Abs Immature Granulocytes: 0.18 10*3/uL — ABNORMAL HIGH (ref 0.00–0.07)
Basophils Absolute: 0 10*3/uL (ref 0.0–0.1)
Basophils Relative: 0 %
Eosinophils Absolute: 0.2 10*3/uL (ref 0.0–0.5)
Eosinophils Relative: 2 %
HCT: 29.3 % — ABNORMAL LOW (ref 39.0–52.0)
Hemoglobin: 9.7 g/dL — ABNORMAL LOW (ref 13.0–17.0)
Immature Granulocytes: 1 %
Lymphocytes Relative: 12 %
Lymphs Abs: 1.7 10*3/uL (ref 0.7–4.0)
MCH: 28.7 pg (ref 26.0–34.0)
MCHC: 33.1 g/dL (ref 30.0–36.0)
MCV: 86.7 fL (ref 80.0–100.0)
Monocytes Absolute: 1 10*3/uL (ref 0.1–1.0)
Monocytes Relative: 7 %
Neutro Abs: 11.6 10*3/uL — ABNORMAL HIGH (ref 1.7–7.7)
Neutrophils Relative %: 78 %
Platelets: 332 10*3/uL (ref 150–400)
RBC: 3.38 MIL/uL — ABNORMAL LOW (ref 4.22–5.81)
RDW: 12.5 % (ref 11.5–15.5)
WBC: 14.7 10*3/uL — ABNORMAL HIGH (ref 4.0–10.5)
nRBC: 0 % (ref 0.0–0.2)

## 2019-12-04 LAB — BETA-HYDROXYBUTYRIC ACID: Beta-Hydroxybutyric Acid: 0.12 mmol/L (ref 0.05–0.27)

## 2019-12-04 LAB — LACTIC ACID, PLASMA: Lactic Acid, Venous: 1 mmol/L (ref 0.5–1.9)

## 2019-12-04 LAB — CBG MONITORING, ED: Glucose-Capillary: 327 mg/dL — ABNORMAL HIGH (ref 70–99)

## 2019-12-04 NOTE — ED Triage Notes (Signed)
Emergency Medicine Provider Triage Evaluation Note  Terre Zabriskie , a 34 y.o. male  was evaluated in triage.  Pt complains of left lower leg pain.  Patient states he has had a skin lesion on his left lower leg for about a week.  At first he thought was getting better, but recently is been getting worse.  He states it is draining all day long.  He does not check his sugars regularly and he has been out of his medicine for at least 3 months.  He reports polydipsia, no polyuria.  No fevers, chills, nausea, vomiting, confusion, weakness.  Review of Systems  Positive: Wound LLE Negative: Fevers, n/v  Physical Exam  BP (!) 145/84 (BP Location: Left Arm)    Pulse 98    Temp 98.9 F (37.2 C) (Oral)    Resp (!) 22    SpO2 100%  Gen:   Awake, no distress   HEENT:  Atraumatic  Resp:  Normal effort  Cardiac:  Normal rate  Abd:   Nondistended, nontender  MSK:   Large wound of left lower leg on the medial aspect with drainage and surrounding erythema consistent with cellulitis. Neuro:  Speech clear   Medical Decision Making  Medically screening exam initiated at 7:54 PM.  Appropriate orders placed.  Jarett Dralle was informed that the remainder of the evaluation will be completed by another provider, this initial triage assessment does not replace that evaluation, and the importance of remaining in the ED until their evaluation is complete.  Clinical Impression   Patient presenting for evaluation of diabetic wound.  Consistent with infection with surrounding cellulitis.  Additionally, patient not taking his diabetes medicines.  Will obtain labs to rule out DKA and systemic infection.  X-ray to rule out osteo.   Franchot Heidelberg, PA-C 12/04/19 1956

## 2019-12-04 NOTE — ED Triage Notes (Signed)
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 Pt unsure of what his blood sugar is as he is unable to check it, and has not been taking his meds.

## 2019-12-04 NOTE — ED Triage Notes (Signed)
Pt is here for ulcer on left lower leg. Pt states he is diabetic and the ulcer began about a week ago. Pt states it is draining through his socks.

## 2019-12-04 NOTE — ED Notes (Signed)
Patient is being discharged from the [facility] and sent to the Emergency Department via wheelchair by staff. Per Loura Halt, NP, patient is stable but in need of higher level of care due to necrotic tissue on ulcer, need for IV antibiotics. Patient is aware and verbalizes understanding of plan of care.  Vitals:   12/04/19 1506  BP: (!) 147/109  Pulse: 94  Resp: 15  Temp: 98.9 F (37.2 C)  SpO2: 99%

## 2019-12-05 ENCOUNTER — Emergency Department (HOSPITAL_COMMUNITY): Payer: Medicaid Other

## 2019-12-05 DIAGNOSIS — L03116 Cellulitis of left lower limb: Secondary | ICD-10-CM | POA: Insufficient documentation

## 2019-12-05 DIAGNOSIS — L02416 Cutaneous abscess of left lower limb: Secondary | ICD-10-CM | POA: Insufficient documentation

## 2019-12-05 DIAGNOSIS — L0390 Cellulitis, unspecified: Secondary | ICD-10-CM | POA: Diagnosis present

## 2019-12-05 DIAGNOSIS — E1140 Type 2 diabetes mellitus with diabetic neuropathy, unspecified: Secondary | ICD-10-CM | POA: Insufficient documentation

## 2019-12-05 DIAGNOSIS — E1165 Type 2 diabetes mellitus with hyperglycemia: Secondary | ICD-10-CM | POA: Insufficient documentation

## 2019-12-05 LAB — SARS CORONAVIRUS 2 BY RT PCR (HOSPITAL ORDER, PERFORMED IN ~~LOC~~ HOSPITAL LAB): SARS Coronavirus 2: NEGATIVE

## 2019-12-05 LAB — URINALYSIS, ROUTINE W REFLEX MICROSCOPIC
Bilirubin Urine: NEGATIVE
Glucose, UA: >=500 mg/dL — AB
Ketones, ur: NEGATIVE mg/dL
Leukocytes,Ua: NEGATIVE
Nitrite: NEGATIVE
Protein, ur: 100 mg/dL — AB
Specific Gravity, Urine: 1.029 (ref 1.005–1.030)
pH: 6 (ref 5.0–8.0)

## 2019-12-05 LAB — CBG MONITORING, ED
Glucose-Capillary: 300 mg/dL — ABNORMAL HIGH (ref 70–99)
Glucose-Capillary: 299 mg/dL — ABNORMAL HIGH (ref 70–99)

## 2019-12-05 LAB — GLUCOSE, CAPILLARY: Glucose-Capillary: 319 mg/dL — ABNORMAL HIGH (ref 70–99)

## 2019-12-05 MED ORDER — AMLODIPINE BESYLATE 10 MG PO TABS
10.0000 mg | ORAL_TABLET | Freq: Every day | ORAL | Status: DC
  Administered 2019-12-05 – 2019-12-07 (×3): 10 mg via ORAL
  Filled 2019-12-05 (×3): qty 1

## 2019-12-05 MED ORDER — ENOXAPARIN SODIUM 40 MG/0.4ML ~~LOC~~ SOLN
40.0000 mg | SUBCUTANEOUS | Status: DC
  Administered 2019-12-05 – 2019-12-06 (×2): 40 mg via SUBCUTANEOUS
  Filled 2019-12-05 (×2): qty 0.4

## 2019-12-05 MED ORDER — INSULIN DETEMIR 100 UNIT/ML ~~LOC~~ SOLN
10.0000 [IU] | Freq: Two times a day (BID) | SUBCUTANEOUS | Status: DC
  Administered 2019-12-05: 10 [IU] via SUBCUTANEOUS
  Filled 2019-12-05 (×3): qty 0.1

## 2019-12-05 MED ORDER — ACETAMINOPHEN 650 MG RE SUPP: 650.0000 mg | Freq: Four times a day (QID) | RECTAL | Status: DC | PRN

## 2019-12-05 MED ORDER — INSULIN ASPART 100 UNIT/ML ~~LOC~~ SOLN
0.0000 [IU] | Freq: Three times a day (TID) | SUBCUTANEOUS | Status: DC
  Administered 2019-12-05: 5 [IU] via SUBCUTANEOUS
  Administered 2019-12-06: 2 [IU] via SUBCUTANEOUS
  Administered 2019-12-06 – 2019-12-07 (×3): 7 [IU] via SUBCUTANEOUS
  Administered 2019-12-07: 3 [IU] via SUBCUTANEOUS

## 2019-12-05 MED ORDER — VANCOMYCIN HCL IN DEXTROSE 1-5 GM/200ML-% IV SOLN
1000.0000 mg | Freq: Two times a day (BID) | INTRAVENOUS | Status: DC
  Administered 2019-12-06: 1000 mg via INTRAVENOUS
  Filled 2019-12-05 (×3): qty 200

## 2019-12-05 MED ORDER — POLYETHYLENE GLYCOL 3350 17 G PO PACK
17.0000 g | PACK | Freq: Every day | ORAL | Status: DC | PRN
  Administered 2019-12-06: 17 g via ORAL
  Filled 2019-12-05: qty 1

## 2019-12-05 MED ORDER — LIDOCAINE HCL (PF) 1 % IJ SOLN
30.0000 mL | Freq: Once | INTRAMUSCULAR | Status: AC
  Administered 2019-12-05: 30 mL
  Filled 2019-12-05: qty 30

## 2019-12-05 MED ORDER — PRAVASTATIN SODIUM 10 MG PO TABS
20.0000 mg | ORAL_TABLET | Freq: Every day | ORAL | Status: DC
  Administered 2019-12-05 – 2019-12-07 (×3): 20 mg via ORAL
  Filled 2019-12-05 (×3): qty 2

## 2019-12-05 MED ORDER — GABAPENTIN 300 MG PO CAPS
300.0000 mg | ORAL_CAPSULE | Freq: Three times a day (TID) | ORAL | Status: DC
  Administered 2019-12-05 – 2019-12-07 (×6): 300 mg via ORAL
  Filled 2019-12-05 (×6): qty 1

## 2019-12-05 MED ORDER — ACETAMINOPHEN 325 MG PO TABS: 650.0000 mg | ORAL_TABLET | Freq: Four times a day (QID) | ORAL | Status: DC | PRN

## 2019-12-05 MED ORDER — IOHEXOL 300 MG/ML  SOLN
100.0000 mL | Freq: Once | INTRAMUSCULAR | Status: AC | PRN
  Administered 2019-12-05: 100 mL via INTRAVENOUS

## 2019-12-05 MED ORDER — VANCOMYCIN HCL 2000 MG/400ML IV SOLN
2000.0000 mg | Freq: Once | INTRAVENOUS | Status: AC
  Administered 2019-12-05: 2000 mg via INTRAVENOUS
  Filled 2019-12-05: qty 400

## 2019-12-05 NOTE — ED Provider Notes (Signed)
[facility] Emergency Department Provider Note MRN:  [id number]  Arrival date & time: 12/05/19     Chief Complaint   Wound Infection   History of Present Illness   [name] [name] is a 34 y.o. year-old male with a history of diabetes, osteomyelitis presenting to the ED with chief complaint of wound.  Patient has had drainage from a wound on his left leg for 1 week.  This morning woke up and it looked worse, getting more and more red, developing darker blisters.  Denies fever, no chills, no cough, no chest pain, no shortness of breath, no abdominal pain, no other complaints.  The wound does not hurt.  Review of Systems  A complete 10 system review of systems was obtained and all systems are negative except as noted in the HPI and PMH.   Patient's Health History    Past Medical History:  Diagnosis Date  . Arthritis    "neck; maybe in my fingers" (11/06/2016)  . Asthma   . Bipolar disorder ([place])    "IED: intentional deficit disorder"  . Depression   . Heart murmur    "when I was a child"  . Hypertension   . Type II diabetes mellitus ([place])     Past Surgical History:  Procedure Laterality Date  . AMPUTATION Left 11/05/2016   Procedure: AMPUTATION LEFT GREAT TOE;  Surgeon: Newt Minion, MD;  Location: [place];  Service: Orthopedics;  Laterality: Left;  . DRESSING CHANGE UNDER ANESTHESIA N/A 04/13/2016   Procedure: DRESSING CHANGE UNDER ANESTHESIA;  Surgeon: Johnathan Hausen, MD;  Location[facility];  Service: General;  Laterality: N/A;  . INCISION AND DRAINAGE ABSCESS Right 09/03/2015   Procedure: INCISION AND DRAINAGE ABSCESS;  Surgeon: Roseanne Kaufman, MD;  Location: [facility];  Service: Orthopedics;  Laterality: Right;  . INCISION AND DRAINAGE PERIRECTAL ABSCESS N/A 04/11/2016   Procedure: IRRIGATION AND DEBRIDEMENT RECTAL ABSCESS;  Surgeon: Johnathan Hausen, MD;  Location[facility];  Service: General;  Laterality: N/A;  . [place]; 1989   x2   .  IRRIGATION AND DEBRIDEMENT ABSCESS Left 08/31/2015   Procedure: IRRIGATION AND DEBRIDEMENT ABSCESS LEFT SHOULDER;  Surgeon: Alphonsa Overall, MD;  Location[facility];  Service: General;  Laterality: Left;  . WOUND DEBRIDEMENT N/A 02/24/2016   Procedure: SACRAL WOUND DEBRIDEMENT;  Surgeon: Georganna Skeans, MD;  Location: [facility];  Service: General;  Laterality: N/A;    Family History  Problem Relation Age of Onset  . Diabetes type II Mother   . Diabetes type II Father     Social History   Socioeconomic History  . Marital status: Married    Spouse name: Not on file  . Number of children: Not on file  . Years of education: Not on file  . Highest education level: Not on file  Occupational History  . Not on file  Tobacco Use  . Smoking status: Never Smoker  . Smokeless tobacco: Never Used  Vaping Use  . Vaping Use: Never used  Substance and Sexual Activity  . Alcohol use: Yes    Comment: 11/06/2016 "I'll drink a little at parties; maybe 5 times/year"  . Drug use: Yes    Types: Marijuana    Comment: 11/06/2016 "stopped ~ 1 month ago"  . Sexual activity: Never    Birth control/protection: Diaphragm  Other Topics Concern  . Not on file  Social History Narrative  . Not on file   Social Determinants of Health   Financial  Resource Strain:   . Difficulty of Paying Living Expenses: Not on file  Food Insecurity:   . Worried About Charity fundraiser in the Last Year: Not on file  . Ran Out of Food in the Last Year: Not on file  Transportation Needs:   . Lack of Transportation (Medical): Not on file  . Lack of Transportation (Non-Medical): Not on file  Physical Activity:   . Days of Exercise per Week: Not on file  . Minutes of Exercise per Session: Not on file  Stress:   . Feeling of Stress : Not on file  Social Connections:   . Frequency of Communication with Friends and Family: Not on file  . Frequency of Social Gatherings with Friends and Family: Not on file  . Attends Religious  Services: Not on file  . Active Member of Clubs or Organizations: Not on file  . Attends Archivist Meetings: Not on file  . Marital Status: Not on file  Intimate Partner Violence:   . Fear of Current or Ex-Partner: Not on file  . Emotionally Abused: Not on file  . Physically Abused: Not on file  . Sexually Abused: Not on file     Physical Exam   Vitals:   12/05/19 1430 12/05/19 1555  BP: (!) 178/105 (!) 177/112  Pulse: 88 83  Resp: 15 17  Temp:    SpO2: 99% 98%    CONSTITUTIONAL: Chronically ill-appearing, NAD NEURO:  Alert and oriented x 3, no focal deficits EYES:  eyes equal and reactive ENT/NECK:  no LAD, no JVD CARDIO: Regular rate, well-perfused, normal S1 and S2 PULM:  CTAB no wheezing or rhonchi GI/GU:  normal bowel sounds, non-distended, non-tender MSK/SPINE:  No gross deformities, no edema SKIN: Circular area of erythema to the medial aspect of the left shin/calf, diameter 10 cm, central scab with fluctuance PSYCH:  Appropriate speech and behavior  *Additional and/or pertinent findings included in MDM below  Diagnostic and Interventional Summary    EKG Interpretation  Date/Time:    Ventricular Rate:    PR Interval:    QRS Duration:   QT Interval:    QTC Calculation:   R Axis:     Text Interpretation:        Labs Reviewed  CBC WITH DIFFERENTIAL/PLATELET - Abnormal; Notable for the following components:      Result Value   WBC 14.7 (*)    RBC 3.38 (*)    Hemoglobin 9.7 (*)    HCT 29.3 (*)    Neutro Abs 11.6 (*)    Abs Immature Granulocytes 0.18 (*)    All other components within normal limits  COMPREHENSIVE METABOLIC PANEL - Abnormal; Notable for the following components:   Sodium 130 (*)    Chloride 94 (*)    Glucose, Bld 348 (*)    Creatinine, Ser 1.46 (*)    Calcium 8.8 (*)    Albumin 2.6 (*)    AST 13 (*)    All other components within normal limits  URINALYSIS, ROUTINE W REFLEX MICROSCOPIC - Abnormal; Notable for the following  components:   Color, Urine STRAW (*)    Glucose, UA >=500 (*)    Hgb urine dipstick SMALL (*)    Protein, ur 100 (*)    Bacteria, UA RARE (*)    All other components within normal limits  CBG MONITORING, ED - Abnormal; Notable for the following components:   Glucose-Capillary 327 (*)    All other components within normal  limits  I-STAT VENOUS BLOOD GAS, ED - Abnormal; Notable for the following components:   pCO2, Ven 41.5 (*)    Sodium 133 (*)    HCT 30.0 (*)    Hemoglobin 10.2 (*)    All other components within normal limits  CBG MONITORING, ED - Abnormal; Notable for the following components:   Glucose-Capillary 300 (*)    All other components within normal limits  CBG MONITORING, ED - Abnormal; Notable for the following components:   Glucose-Capillary 299 (*)    All other components within normal limits  CULTURE, BLOOD (ROUTINE X 2)  SARS CORONAVIRUS 2 BY RT PCR (HOSPITAL ORDER, [facility])  CULTURE, BLOOD (ROUTINE X 2)  AEROBIC CULTURE (SUPERFICIAL SPECIMEN)  LACTIC ACID, PLASMA  BETA-HYDROXYBUTYRIC ACID    CT TIBIA FIBULA LEFT W CONTRAST  Final Result    DG Tibia/Fibula Left  Final Result      Medications  vancomycin (VANCOCIN) IVPB 1000 mg/200 mL premix (has no administration in time range)  amLODipine (NORVASC) tablet 10 mg (has no administration in time range)  gabapentin (NEURONTIN) capsule 300 mg (has no administration in time range)  pravastatin (PRAVACHOL) tablet 20 mg (has no administration in time range)  enoxaparin (LOVENOX) injection 40 mg (has no administration in time range)  acetaminophen (TYLENOL) tablet 650 mg (has no administration in time range)    Or  acetaminophen (TYLENOL) suppository 650 mg (has no administration in time range)  polyethylene glycol (MIRALAX / GLYCOLAX) packet 17 g (has no administration in time range)  insulin detemir (LEVEMIR) injection 10 Units (has no administration in time range)  insulin aspart  (novoLOG) injection 0-9 Units (has no administration in time range)  iohexol (OMNIPAQUE) 300 MG/ML solution 100 mL (100 mLs Intravenous Contrast Given 12/05/19 0956)  vancomycin (VANCOREADY) IVPB 2000 mg/400 mL (2,000 mg Intravenous New Bag/Given 12/05/19 1416)  lidocaine (PF) (XYLOCAINE) 1 % injection 30 mL (30 mLs Other Given 12/05/19 1510)     Procedures  /  Critical Care .Marland KitchenIncision and Drainage  Date/Time: 12/05/2019 4:33 PM Performed by: Maudie Flakes, MD Authorized by: Maudie Flakes, MD   Consent:    Consent obtained:  Verbal   Consent given by:  Patient   Risks discussed:  Bleeding, damage to other organs, incomplete drainage, infection and pain Location:    Type:  Abscess   Size:  10cm   Location:  Lower extremity   Lower extremity location:  Leg   Leg location:  L lower leg Pre-procedure details:    Skin preparation:  Chloraprep Anesthesia (see MAR for exact dosages):    Anesthesia method:  Local infiltration   Local anesthetic:  Lidocaine 1% w/o epi Procedure type:    Complexity:  Complex Procedure details:    Incision type: Eschar removal.   Incision depth:  Submucosal   Scalpel blade:  11   Wound management:  Probed and deloculated, irrigated with saline, extensive cleaning and debrided   Drainage:  Bloody and purulent   Drainage amount:  Copious   Wound treatment:  Wound left open   Packing material: Wet Kerlix. Post-procedure details:    Patient tolerance of procedure:  Tolerated well, no immediate complications Comments:     Large fluctuant eschar removed with combination of 11 blade and scissors.  Overlying and adjacent skin largely insensate.  Copious foul-smelling brown purulent material.  Satellite eschar found to be tracking from the larger eschar and also removed.  Both wounds packed with  wet Kerlix, dressed with dry Kerlix, there was some oozing of blood but no arterial bleeding and so wound covered with Coban pressure dressing.  Patient tolerated the  procedure well.  Wound/dressing rechecked after 20 minutes and no continued bleeding.    ED Course and Medical Decision Making  I have reviewed the triage vital signs, the nursing notes, and pertinent available records from the EMR.  Listed above are laboratory and imaging tests that I personally ordered, reviewed, and interpreted and then considered in my medical decision making (see below for details).  History of osteo-, history of diabetes, concern for osteo of the tib-fib and or deep space abscess.  Knee x-ray does not show evidence of osteo but this is a low sensitivity test, will obtain CT imaging.  The CT imaging may also help delineate the depth of this abscess and whether or not this is appropriate for bedside drainage by ED physician.     CT is without osteomyelitis, large abscess.  Orthopedics consulted with the thought that patient may benefit from operative washout.  Orthopedics recommending IV antibiotics, admission, eschar removal.  See note above for procedure note.  Admitted to family medicine for further care.  Barth Kirks. [place]onia Small, [place] [email or web address]  Final Clinical Impressions(s) / ED Diagnoses     ICD-10-CM   1. Cellulitis of left lower extremity  L03.116   2. Abscess  L02.91     ED Discharge Orders    None       Discharge Instructions Discussed with and Provided to Patient:   Discharge Instructions   None       Maudie Flakes, MD 12/05/19 [phone]

## 2019-12-05 NOTE — Progress Notes (Signed)
Pharmacy Antibiotic Note  Deane Wattenbarger is a 34 y.o. male admitted on 12/04/2019 with cellulitis. Patient presented with drainage from wound on his left leg.  Pharmacy has been consulted for vancomycin dosing.   Plan: -Vancomycin 2 gm IV load followed by vancomycin 1 gm IV Q 12 hours -Monitor CBC, renal fx, cultures and clinical progress -VT at SS     Temp (24hrs), Avg:98.9 F (37.2 C), Min:98.9 F (37.2 C), Max:98.9 F (37.2 C)  Recent Labs  Lab 12/04/19 1953  WBC 14.7*  CREATININE 1.46*  LATICACIDVEN 1.0    CrCl cannot be calculated (Unknown ideal weight.).    No Known Allergies  Antimicrobials this admission: Vanc 9/18 >>   Dose adjustments this admission:   Microbiology results:   Thank you for allowing pharmacy to be a part of this patient's care.  Albertina Parr, PharmD., BCPS, BCCCP Clinical Pharmacist Clinical phone for 12/05/19 until 11:30pm: [phone] If after 11:30pm, please refer to [facility] for unit-specific pharmacist

## 2019-12-05 NOTE — Consult Note (Signed)
ORTHOPAEDIC CONSULTATION  REQUESTING PHYSICIAN: Maudie Flakes, MD  Chief Complaint: "my left calf hurts"  HPI: [name] [name] is a 34 y.o. male who presents with left calf pain for one week.  Patient notes he had a small scab that progressively worsened over the last week. Denies any fevers, chills,night sweats, malaise.  Pain is tolerable.  He has a history of DM and it is not controlled well.  Notes his BG runs from 200-300 when he checks it, which is not regularly. Recently moved back to the area from [place] and has not re-established care for his DM.    Past Medical History:  Diagnosis Date   Arthritis    "neck; maybe in my fingers" (11/06/2016)   Asthma    Bipolar disorder ([place])    "IED: intentional deficit disorder"   Depression    Heart murmur    "when I was a child"   Hypertension    Type II diabetes mellitus ([place])    Past Surgical History:  Procedure Laterality Date   AMPUTATION Left 11/05/2016   Procedure: AMPUTATION LEFT GREAT TOE;  Surgeon: Newt Minion, MD;  Location: [place];  Service: Orthopedics;  Laterality: Left;   DRESSING CHANGE UNDER ANESTHESIA N/A 04/13/2016   Procedure: DRESSING CHANGE UNDER ANESTHESIA;  Surgeon: Johnathan Hausen, MD;  [facility];  Service: General;  Laterality: N/A;   INCISION AND DRAINAGE ABSCESS Right 09/03/2015   Procedure: INCISION AND DRAINAGE ABSCESS;  Surgeon: Roseanne Kaufman, MD;  [facility];  Service: Orthopedics;  Laterality: Right;   INCISION AND DRAINAGE PERIRECTAL ABSCESS N/A 04/11/2016   Procedure: IRRIGATION AND DEBRIDEMENT RECTAL ABSCESS;  Surgeon: Johnathan Hausen, MD;  [facility];  Service: General;  Laterality: N/A;   [place]; 1989   x2    IRRIGATION AND DEBRIDEMENT ABSCESS Left 08/31/2015   Procedure: IRRIGATION AND DEBRIDEMENT ABSCESS LEFT SHOULDER;  Surgeon: Alphonsa Overall, MD;  [facility];  Service: General;  Laterality: Left;   WOUND DEBRIDEMENT N/A 02/24/2016    Procedure: SACRAL WOUND DEBRIDEMENT;  Surgeon: Georganna Skeans, MD;  Location: [place];  Service: General;  Laterality: N/A;   Social History   Socioeconomic History   Marital status: Married    Spouse name: Not on file   Number of children: Not on file   Years of education: Not on file   Highest education level: Not on file  Occupational History   Not on file  Tobacco Use   Smoking status: Never Smoker   Smokeless tobacco: Never Used  Vaping Use   Vaping Use: Never used  Substance and Sexual Activity   Alcohol use: Yes    Comment: 11/06/2016 "I'll drink a little at parties; maybe 5 times/year"   Drug use: Yes    Types: Marijuana    Comment: 11/06/2016 "stopped ~ 1 month ago"   Sexual activity: Never    Birth control/protection: Diaphragm  Other Topics Concern   Not on file  Social History Narrative   Not on file   Social Determinants of Health   Financial Resource Strain:    Difficulty of Paying Living Expenses: Not on file  Food Insecurity:    Worried About Charity fundraiser in the Last Year: Not on file   [employer] of Food in the Last Year: Not on file  Transportation Needs:    Lack of Transportation (Medical): Not on file   Lack of Transportation (Non-Medical): Not on file  Physical Activity:  Days of Exercise per Week: Not on file   Minutes of Exercise per Session: Not on file  Stress:    Feeling of Stress : Not on file  Social Connections:    Frequency of Communication with Friends and Family: Not on file   Frequency of Social Gatherings with Friends and Family: Not on file   Attends Religious Services: Not on file   Active Member of Clubs or Organizations: Not on file   Attends Archivist Meetings: Not on file   Marital Status: Not on file   Family History  Problem Relation Age of Onset   Diabetes type II Mother    Diabetes type II Father    - negative except otherwise stated in the family history section No Known  Allergies Prior to Admission medications   Medication Sig Start Date End Date Taking? Authorizing Provider  acetaminophen (TYLENOL) 325 MG tablet Take 2 tablets (650 mg total) by mouth every 6 (six) hours as needed for mild pain or fever (for pain). Patient not taking: Reported on 11/22/2016 04/17/16   Regalado, Jerald Kief A, MD  aripiprazole (ABILIFY) 10 MG disintegrating tablet Take 10 mg by mouth daily.    [provider]  escitalopram (LEXAPRO) 20 MG tablet Take 1 tablet (20 mg total) by mouth at bedtime. 10/04/16   Ladell Pier, MD  gabapentin (NEURONTIN) 300 MG capsule Take 1 capsule (300 mg total) by mouth 3 (three) times daily. 11/22/16   Ladell Pier, MD  gentamicin cream (GARAMYCIN) 0.1 % Apply 1 application topically daily. Patient not taking: Reported on 11/22/2016 10/29/16   Ladell Pier, MD  insulin aspart (NOVOLOG FLEXPEN) 100 UNIT/ML FlexPen Inject 20 units 15 minutes before your two largest meals of the day 11/22/16   Ladell Pier, MD  Insulin Detemir (LEVEMIR FLEXPEN) 100 UNIT/ML Pen Inject 30 Units into the skin 2 (two) times daily. 11/22/16   Ladell Pier, MD  mupirocin cream (BACTROBAN) 2 % Apply 1 application topically 2 (two) times daily. Patient not taking: Reported on 11/22/2016 10/04/16   Ladell Pier, MD  pravastatin (PRAVACHOL) 20 MG tablet Take 1 tablet (20 mg total) by mouth daily. 10/04/16   Ladell Pier, MD   DG Tibia/Fibula Left  Result Date: 12/04/2019 CLINICAL DATA:  Left leg wound. EXAM: LEFT TIBIA AND FIBULA - 2 VIEW COMPARISON:  None. FINDINGS: There is no evidence of fracture or other focal bone lesions. A mild to moderate amount of predominance subcutaneous soft tissue air is seen along the posteromedial aspect of the distal left calf. Mild-to-moderate severity focal soft tissue swelling is also seen within this region. IMPRESSION: Findings consistent with mild to moderate severity focal cellulitis, without evidence of acute  osteomyelitis. Electronically Signed   By: Virgina Norfolk M.D.   On: 12/04/2019 20:39   CT TIBIA FIBULA LEFT W CONTRAST  Result Date: 12/05/2019 CLINICAL DATA:  Left lower leg pain. EXAM: CT OF THE LOWER RIGHT EXTREMITY WITH CONTRAST TECHNIQUE: Multidetector CT imaging of the lower right extremity was performed according to the standard protocol following intravenous contrast administration. COMPARISON:  None. CONTRAST:  19mL OMNIPAQUE IOHEXOL 300 MG/ML  SOLN FINDINGS: Bones/Joint/Cartilage No acute fracture or dislocation. No periosteal reaction or bone destruction. 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 Normal alignment. No joint effusion. Ligaments Ligaments are suboptimally evaluated  by CT. Muscles and Tendons Muscles are normal. No muscle atrophy. No intramuscular fluid collection or hematoma. Soft tissue Severe soft tissue edema in the subcutaneous fat of the left mid and distal lower leg along the medial aspect with severe overlying skin thickening. 4 x 2.2 x 7.3 cm complex fluid collection within the subcutaneous fat most consistent with an abscess with multiple locules of air within the fluid. No soft tissue mass. IMPRESSION: 1. Severe soft tissue edema in the subcutaneous fat of the left mid and distal lower leg along the medial aspect with severe overlying skin thickening consistent with severe cellulitis. 4 x 2.2 x 7.3 cm abscess within the subcutaneous fat with multiple locules of air within the fluid. 2. 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 Electronically Signed   By: Kathreen Devoid   On: 12/05/2019 10:30   - pertinent xrays, CT, MRI studies were reviewed and  independently interpreted  Positive ROS: All other systems have been reviewed and were otherwise negative with the exception of those mentioned in the HPI and as above.  Physical Exam: General: Alert, no acute distress Psychiatric: Patient is competent for consent with normal mood and affect Lymphatic: No axillary or cervical lymphadenopathy Cardiovascular: No pedal edema Respiratory: No cyanosis, no use of accessory musculature GI: No organomegaly, abdomen is soft and non-tender    Images:  @ENCIMAGES @  Labs:  Lab Results  Component Value Date   HGBA1C 11.0 (H) 11/04/2016   HGBA1C 11.8 08/08/2016   HGBA1C 10.5 (H) 04/11/2016   ESRSEDRATE 50 (H) 11/22/2016   ESRSEDRATE 109 (H) 11/03/2016   ESRSEDRATE 17 (H) 04/11/2016   CRP <0.8 11/22/2016   CRP 6.6 (H) 04/11/2016   REPTSTATUS 11/27/2016 FINAL 11/22/2016   GRAMSTAIN  04/11/2016    NO WBC SEEN ABUNDANT GRAM NEGATIVE RODS ABUNDANT GRAM POSITIVE COCCI IN PAIRS Performed at [facility], [place] [address], [place], [place] [address]    CULT NO GROWTH 5 DAYS 11/22/2016   LABORGA STAPHYLOCOCCUS AUREUS (A) 11/03/2016    Lab Results  Component Value Date   ALBUMIN 2.6 (L) 12/04/2019   ALBUMIN 3.3 (L) 11/22/2016   ALBUMIN 3.1 (L) 11/03/2016   PREALBUMIN 29.1 11/22/2016   PREALBUMIN 10.6 (L) 11/03/2016    Neurologic: Patient does not have protective sensation bilateral lower extremities.   MUSCULOSKELETAL:   Ortho exam demonstrates left calf with large area of erythema and a central area of eschar.  Scattered areas of fluctuance below the eschar and around the erythematous perimeter. Small amount of drainage is expressible.  No significant tenderness to palpation.  No crepitus is felt. No pain with ankle range of motion passively. No knee effusion present.  No tenderness throughout the non-erythematous skin.   Assessment: Superficial abscess of the medial left calf  Plan: Recommend unroof the area of eschar and  use wet to dry dressings.  Admit to medicine for IV abx and management of DM.  No operative management necessary at this time.  Will continue to follow as inpatient and inform Dr. Sharol Given as well.    Thank you for the consult and the opportunity to see Mr. [place], [place] [facility] [phone] 12:47 PM

## 2019-12-05 NOTE — H&P (Addendum)
[facility] Admission History and Physical Service Pager: [phone]  Patient name: [name] [name] Medical record number: [id number] Date of birth: 26-Dec-1985 Age: 34 y.o. Gender: male  Primary Care Provider: Patient, No Pcp Per Consultants: None Code Status: Full Preferred Emergency Contact: Kavion Mancinas [phone]  Chief Complaint: LLE Wound  Assessment and Plan: [name] [name] is a 34 y.o. male presenting with Cellulitis of Left leg . PMH is significant for Uncontrolled Type 2 DM, HTN, Bipolar Disorder,   Cellulitis and Abscess of LLE Patient presenting with 2 weeks of worsening LLE wound with surrounding cellulitis in setting of uncontrolled T2DM and h/o osteomyelitis s/p amputation of two left toes (2018, 2019). Patient is hemodynamically stable without any systemic symptoms. CT of tibia/fibula without osteo but notable for severe cellulitis and 4 x 2.2 x 7.3 cm abscess within the subcutaneous fat with multiple locules of air within the fluid. Only mild leukocytosis, no lactic acidosis. Ortho consulted in ED and recommended deroofing of overlying scab and admission for IV antibiotics. Procedure performed by ED provider which was notable for copious bloody and purulent drainage. Wound cultures collected. Started on IV vanc. Considered broad spectrum however given debridement and source control, will just continue Vanc with plan to transition to Doxy PO tomorrow pending cultures.  - Admit to [place], attending Dr. Gwendlyn Deutscher - Ortho consulted, appreciate recs - Vanc dosing per Pharmacy - plan to transition to PO Doxy on 9/19 if remains stable - follow up wound cultures -Tylenol 650 q6hr PRN - Miralax PRN for constipation - Carb modifed diet - Lovenox 40 - A1C, lipid panel - strict glucose control in setting of infection, counseling provided  Uncontrolled Type 2 Diabetes Patient previously was taking a short acting and long acting insulin, does not remember type or  doses.  Has not been taking anything for 6 months.  Glucose- 300's BHB- 0.12.  Last A1C from 2018 was 11.8.   - sSSI - Start Levemir 10U BID - Obtain AM A1c  - AM Lipid Panel  - monitor CBG's and adjust insulin as indicated  Pseudohyponatremia Na 130 on admission, CBG 348. Corrected Sodium-134 in setting of Hyperglycemia.   - Will continue to monitor. - BMP in AM  Elevated Creatinine Cr-1.46. Unknown Baseline. Last Cr in 2018 around 1.0.  May be some CKD in setting of uncontrolled Diabetes and Hypertension.  Receiving fluids with Vancomycin.  - continue to monitor - encourage PO hydration - avoid nephrotoxic agents  Hypertension BP currently hypertensive in 140's-190's.  Not on any current home medications.  Previously was Losartan, but has not taken for several months..  - Will start Norvasc 10 mg in setting of AKI  Neuropathic Pain Patient previously on Gabapentin 300 mg TID. - Will restart Gabapentin  Normocytic Anemia Chronic, stable. Hgb 10.2, MCV 86.7. Baseline 9-10.  Last iron panel in 2018 notable for low iron (31), TIBC (188), and sat ratios (17). Ferritin 225. Denies acute bleed. Appears like anemia of chronic disease.  - continue to monitor  - Consider further work-up with repeat anemia panel if declines  Bipolar Disorder: Unclear type 1 vs 2. Currently not on any antipsychotic or mood stabilizing medications. Denies SI/HI. Does have childlike demeanor but overall stable - recommend outpatient psych follow up  FEN/GI: Full diet Prophylaxis: Lovenox 40U  Disposition: Pending wound care  History of Present Illness:  [name] [name] is a 34 y.o. male presenting with worsening left lower extremity pain and wound. He notes it developed 2  weeks that started with a small scab. He picked at it and it started to get larger and bigger. It started to develop pus and drainage. It continued to worsen and enlarging. He denies any pain or difficulty with ambulation. Denies any  systemic symptoms such as fever, chills, nausea, vomiting, abdominal pain, chest pain, SOB. He has history of uncontrolled diabetes on insulin, but has not taken in last 6 months. He believes it was Novolog 20U with each meal and 15U of long acting. Intolerant to Metformin in the past. Is pretty sure he has type 2 diabetes. Has chronic neuropathy in LE's, treated with gabapentin.  Has "border line high blood pressure" and is treated with Losartan. He denies taking this medication either.  History of two prior amputations - one here at [facility] in 2018 and one in [place] in 2019. 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 This was removed and treated in [place].  He has a history of bipolar disorder but is currently not any treating with anything.  Denies any tobacco or nicotine use Alcohol: rarely, 1x per month or every other month Illicit drugs: Marijuana everyday  Review Of Systems: Per HPI with the following additions:   Review of Systems  Constitutional: Negative for chills, fatigue and fever.  HENT: Negative for sore throat.   Respiratory: Negative for cough, chest tightness and shortness of breath.   Gastrointestinal: Negative for abdominal pain, constipation, diarrhea, nausea and vomiting.  Skin: Positive for wound.  Psychiatric/Behavioral: Negative for self-injury and suicidal ideas.     Patient Active Problem List   Diagnosis Date Noted  . Cellulitis of foot, left 11/22/2016  . Diabetic neuropathy ([place]) 11/22/2016  . Fracture of 2nd metatarsal 11/22/2016  . Fracture of 3rd metatarsal 11/22/2016  . Postprocedural non-healing wound 11/22/2016  . Acute cystitis with positive culture   . Cutaneous abscess of left foot   . Osteomyelitis ([place]) 11/03/2016  . Poorly controlled type 2 diabetes mellitus with complication ([place]) 03/00/9233  . Hyperlipidemia 10/17/2016  . Depression 10/17/2016  . Neck pain   . Asthma 04/11/2016  .  Uncontrolled type 2 diabetes mellitus with complication ([place])   . Obesity, morbid ([place]) 09/06/2015  . Hypertension 09/06/2015    Past Medical History: Past Medical History:  Diagnosis Date  . Arthritis    "neck; maybe in my fingers" (11/06/2016)  . Asthma   . Bipolar disorder ([place])    "IED: intentional deficit disorder"  . Depression   . Heart murmur    "when I was a child"  . Hypertension   . Type II diabetes mellitus ([place])     Past Surgical History: Past Surgical History:  Procedure Laterality Date  . AMPUTATION Left 11/05/2016   Procedure: AMPUTATION LEFT GREAT TOE;  Surgeon: Newt Minion, MD;  Location: [facility];  Service: Orthopedics;  Laterality: Left;  . DRESSING CHANGE UNDER ANESTHESIA N/A 04/13/2016   Procedure: DRESSING CHANGE UNDER ANESTHESIA;  Surgeon: Johnathan Hausen, MD;  Location: [facility];  Service: General;  Laterality: N/A;  . INCISION AND DRAINAGE ABSCESS Right 09/03/2015   Procedure: INCISION AND DRAINAGE ABSCESS;  Surgeon: Roseanne Kaufman, MD;  Location: [facility];  Service: Orthopedics;  Laterality: Right;  . INCISION AND DRAINAGE PERIRECTAL ABSCESS N/A 04/11/2016   Procedure: IRRIGATION AND DEBRIDEMENT RECTAL ABSCESS;  Surgeon: Johnathan Hausen, MD;  Location: [facility];  Service: General;  Laterality: N/A;  . [place]; 1989  x2   . IRRIGATION AND DEBRIDEMENT ABSCESS Left 08/31/2015   Procedure: IRRIGATION AND DEBRIDEMENT ABSCESS LEFT SHOULDER;  Surgeon: Alphonsa Overall, MD;  Location: [facility];  Service: General;  Laterality: Left;  . WOUND DEBRIDEMENT N/A 02/24/2016   Procedure: SACRAL WOUND DEBRIDEMENT;  Surgeon: Georganna Skeans, MD;  Location: [facility];  Service: General;  Laterality: N/A;    Social History: Social History   Tobacco Use  . Smoking status: Never Smoker  . Smokeless tobacco: Never Used  Vaping Use  . Vaping Use: Never used  Substance Use Topics  . Alcohol use: Yes    Comment: 11/06/2016 "I'll drink a little at parties; maybe 5  times/year"  . Drug use: Yes    Types: Marijuana    Comment: 11/06/2016 "stopped ~ 1 month ago"   Additional social history:   Please also refer to relevant sections of EMR.  Family History: Family History  Problem Relation Age of Onset  . Diabetes type II Mother   . Diabetes type II Father     Allergies and Medications: No Known Allergies No current facility-administered medications on file prior to encounter.   Current Outpatient Medications on File Prior to Encounter  Medication Sig Dispense Refill  . acetaminophen (TYLENOL) 325 MG tablet Take 2 tablets (650 mg total) by mouth every 6 (six) hours as needed for mild pain or fever (for pain). (Patient not taking: Reported on 11/22/2016) 10 tablet 0  . aripiprazole (ABILIFY) 10 MG disintegrating tablet Take 10 mg by mouth daily.    Marland Kitchen escitalopram (LEXAPRO) 20 MG tablet Take 1 tablet (20 mg total) by mouth at bedtime. 30 tablet 6  . gabapentin (NEURONTIN) 300 MG capsule Take 1 capsule (300 mg total) by mouth 3 (three) times daily. 90 capsule 6  . gentamicin cream (GARAMYCIN) 0.1 % Apply 1 application topically daily. (Patient not taking: Reported on 11/22/2016) 30 g 0  . insulin aspart (NOVOLOG FLEXPEN) 100 UNIT/ML FlexPen Inject 20 units 15 minutes before your two largest meals of the day 15 mL 3  . Insulin Detemir (LEVEMIR FLEXPEN) 100 UNIT/ML Pen Inject 30 Units into the skin 2 (two) times daily. 15 mL 3  . mupirocin cream (BACTROBAN) 2 % Apply 1 application topically 2 (two) times daily. (Patient not taking: Reported on 11/22/2016) 15 g 0  . pravastatin (PRAVACHOL) 20 MG tablet Take 1 tablet (20 mg total) by mouth daily. 90 tablet 3    Objective: BP (!) 195/110   Pulse 82   Temp 98.9 F (37.2 C) (Oral)   Resp 17   SpO2 97%  Exam:  Physical Exam Constitutional:      General: He is not in acute distress.    Appearance: Normal appearance. He is not ill-appearing.  HENT:     Head: Normocephalic and atraumatic.     Mouth/Throat:      Mouth: Mucous membranes are moist.  Cardiovascular:     Rate and Rhythm: Normal rate and regular rhythm.     Pulses: Normal pulses.     Heart sounds: No murmur heard.  No friction rub. No gallop.   Pulmonary:     Effort: Pulmonary effort is normal.     Breath sounds: Normal breath sounds.  Abdominal:     General: Abdomen is flat. Bowel sounds are normal. There is no distension.     Palpations: Abdomen is soft.     Tenderness: There is no abdominal tenderness.  Skin:    General: Skin is warm.  Capillary Refill: Capillary refill takes less than 2 seconds.     Findings: Abscess and rash present.     Comments: Area of eschar on left calf 6.5 cm.  Smaller area of eschar 1 cm.  No tenderness to palpation.  Some fluctuance underneath.  Surrounded by large area of non-blanching erythema.  Neurological:     Mental Status: He is alert.     Comments: Patient has contractures of right hand, right ankle and right hip and right knee.  Limited dorsiflexion in right foot and limited grip ini right hand.  Strength otherwise 5/5 bilaterally.  Psychiatric:        Mood and Affect: Mood normal.        Behavior: Behavior normal.              Labs and Imaging: CBC BMET  Recent Labs  Lab 12/04/19 1953 12/04/19 1953 12/04/19 2001  WBC 14.7*  --   --   HGB 9.7*   < > 10.2*  HCT 29.3*   < > 30.0*  PLT 332  --   --    < > = values in this interval not displayed.   Recent Labs  Lab 12/04/19 1953 12/04/19 1953 12/04/19 2001  NA 130*   < > 133*  K 3.7   < > 3.7  CL 94*  --   --   CO2 25  --   --   BUN 17  --   --   CREATININE 1.46*  --   --   GLUCOSE 348*  --   --   CALCIUM 8.8*  --   --    < > = values in this interval not displayed.     EKG: Normal Sinus   DG Tibia/Fibula Left  Result Date: 12/04/2019 CLINICAL DATA:  Left leg wound. EXAM: LEFT TIBIA AND FIBULA - 2 VIEW COMPARISON:  None. FINDINGS: There is no evidence of fracture or other focal bone lesions. A mild to  moderate amount of predominance subcutaneous soft tissue air is seen along the posteromedial aspect of the distal left calf. Mild-to-moderate severity focal soft tissue swelling is also seen within this region. IMPRESSION: Findings consistent with mild to moderate severity focal cellulitis, without evidence of acute osteomyelitis. Electronically Signed   By: Virgina Norfolk M.D.   On: 12/04/2019 20:39   CT TIBIA FIBULA LEFT W CONTRAST  Result Date: 12/05/2019 CLINICAL DATA:  Left lower leg pain. EXAM: CT OF THE LOWER RIGHT EXTREMITY WITH CONTRAST TECHNIQUE: Multidetector CT imaging of the lower right extremity was performed according to the standard protocol following intravenous contrast administration. COMPARISON:  None. CONTRAST:  140mL OMNIPAQUE IOHEXOL 300 MG/ML  SOLN FINDINGS: Bones/Joint/Cartilage No acute fracture or dislocation. No periosteal reaction or bone destruction. 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 Normal alignment. No joint effusion. Ligaments Ligaments are suboptimally evaluated by CT. Muscles and Tendons Muscles are normal. No muscle atrophy. No intramuscular fluid collection or hematoma. Soft tissue Severe soft tissue edema in the subcutaneous fat of the left mid and distal lower leg along the medial aspect with severe overlying skin thickening. 4 x 2.2 x 7.3 cm complex fluid collection within the subcutaneous fat most consistent with an abscess with multiple locules of air within the fluid. No soft tissue mass. IMPRESSION: 1. Severe soft tissue edema in the subcutaneous  fat of the left mid and distal lower leg along the medial aspect with severe overlying skin thickening consistent with severe cellulitis. 4 x 2.2 x 7.3 cm abscess within the subcutaneous fat with multiple locules of air within the fluid. 2. Old ununited  fracture of the anterior process of the calcaneus. 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 Electronically Signed   By: Kathreen Devoid   On: 12/05/2019 10:30    Delora Fuel, MD 12/05/2019, 1:44 PM PGY-1, [place] Intern pager: [phone], text pages welcome  Upper Level Addendum: I have seen and evaluated this patient along with Dr. Manus Rudd and reviewed the above note, making necessary revisions.  General: pleasant middle aged man, sitting comfortably in ED bed, well nourished, well developed, in no acute distress with non-toxic appearance Resp: breathing comfortably on room air, speaking in full sentences Skin: warm, dry, large eschar with smaller adjacent eschar on medial left LLE with surrounding area of erythema and warmth, large thick domed lesion on lateral dorsal foot without signs of infection and firm to touch, areas of small excoriations/follicular spots along LE's bilaterally/abdomen/chest/arms bilaterally Extremities: warm and well perfused, 2+ radial and pedal pulses bilaterally MSK: ROM grossly intact, strength intact, contracture of right hand primarily involving 1st and 2nd digit with decreased ROM and strength Neuro: Alert and oriented, speech normal  Mina Marble, D.O. 12/05/2019, 4:59 PM PGY-3, [place]

## 2019-12-06 DIAGNOSIS — E1165 Type 2 diabetes mellitus with hyperglycemia: Secondary | ICD-10-CM

## 2019-12-06 DIAGNOSIS — N179 Acute kidney failure, unspecified: Secondary | ICD-10-CM | POA: Diagnosis present

## 2019-12-06 DIAGNOSIS — D638 Anemia in other chronic diseases classified elsewhere: Secondary | ICD-10-CM | POA: Diagnosis present

## 2019-12-06 DIAGNOSIS — L03116 Cellulitis of left lower limb: Secondary | ICD-10-CM

## 2019-12-06 DIAGNOSIS — Z20822 Contact with and (suspected) exposure to covid-19: Secondary | ICD-10-CM | POA: Diagnosis present

## 2019-12-06 DIAGNOSIS — F319 Bipolar disorder, unspecified: Secondary | ICD-10-CM | POA: Diagnosis present

## 2019-12-06 DIAGNOSIS — L84 Corns and callosities: Secondary | ICD-10-CM | POA: Diagnosis present

## 2019-12-06 DIAGNOSIS — Z794 Long term (current) use of insulin: Secondary | ICD-10-CM | POA: Diagnosis not present

## 2019-12-06 DIAGNOSIS — L02416 Cutaneous abscess of left lower limb: Secondary | ICD-10-CM | POA: Diagnosis present

## 2019-12-06 DIAGNOSIS — Z89412 Acquired absence of left great toe: Secondary | ICD-10-CM | POA: Diagnosis not present

## 2019-12-06 DIAGNOSIS — Z9114 Patient's other noncompliance with medication regimen: Secondary | ICD-10-CM | POA: Diagnosis not present

## 2019-12-06 DIAGNOSIS — Z79899 Other long term (current) drug therapy: Secondary | ICD-10-CM | POA: Diagnosis not present

## 2019-12-06 DIAGNOSIS — Z833 Family history of diabetes mellitus: Secondary | ICD-10-CM | POA: Diagnosis not present

## 2019-12-06 DIAGNOSIS — I10 Essential (primary) hypertension: Secondary | ICD-10-CM | POA: Diagnosis present

## 2019-12-06 DIAGNOSIS — B951 Streptococcus, group B, as the cause of diseases classified elsewhere: Secondary | ICD-10-CM | POA: Diagnosis present

## 2019-12-06 DIAGNOSIS — L0291 Cutaneous abscess, unspecified: Secondary | ICD-10-CM | POA: Diagnosis present

## 2019-12-06 DIAGNOSIS — Z6835 Body mass index (BMI) 35.0-35.9, adult: Secondary | ICD-10-CM | POA: Diagnosis not present

## 2019-12-06 DIAGNOSIS — E1140 Type 2 diabetes mellitus with diabetic neuropathy, unspecified: Secondary | ICD-10-CM | POA: Diagnosis present

## 2019-12-06 DIAGNOSIS — E785 Hyperlipidemia, unspecified: Secondary | ICD-10-CM | POA: Diagnosis present

## 2019-12-06 DIAGNOSIS — E11628 Type 2 diabetes mellitus with other skin complications: Secondary | ICD-10-CM | POA: Diagnosis present

## 2019-12-06 LAB — LIPID PANEL
Cholesterol: 125 mg/dL (ref 0–200)
HDL: 27 mg/dL — ABNORMAL LOW (ref >40)
LDL Cholesterol: 75 mg/dL (ref 0–99)
Total CHOL/HDL Ratio: 4.6 RATIO
Triglycerides: 117 mg/dL (ref <150)
VLDL: 23 mg/dL (ref 0–40)

## 2019-12-06 LAB — CBC
HCT: 29.4 % — ABNORMAL LOW (ref 39.0–52.0)
Hemoglobin: 10 g/dL — ABNORMAL LOW (ref 13.0–17.0)
MCH: 29.2 pg (ref 26.0–34.0)
MCHC: 34 g/dL (ref 30.0–36.0)
MCV: 86 fL (ref 80.0–100.0)
Platelets: 289 10*3/uL (ref 150–400)
RBC: 3.42 MIL/uL — ABNORMAL LOW (ref 4.22–5.81)
RDW: 12.2 % (ref 11.5–15.5)
WBC: 13 10*3/uL — ABNORMAL HIGH (ref 4.0–10.5)
nRBC: 0 % (ref 0.0–0.2)

## 2019-12-06 LAB — BASIC METABOLIC PANEL
Anion gap: 9 (ref 5–15)
BUN: 13 mg/dL (ref 6–20)
CO2: 25 mmol/L (ref 22–32)
Calcium: 8.5 mg/dL — ABNORMAL LOW (ref 8.9–10.3)
Chloride: 95 mmol/L — ABNORMAL LOW (ref 98–111)
Creatinine, Ser: 1.49 mg/dL — ABNORMAL HIGH (ref 0.61–1.24)
GFR calc Af Amer: >60 mL/min (ref >60)
GFR calc non Af Amer: >60 mL/min (ref >60)
Glucose, Bld: 368 mg/dL — ABNORMAL HIGH (ref 70–99)
Potassium: 3.8 mmol/L (ref 3.5–5.1)
Sodium: 129 mmol/L — ABNORMAL LOW (ref 135–145)

## 2019-12-06 LAB — GLUCOSE, CAPILLARY
Glucose-Capillary: 347 mg/dL — ABNORMAL HIGH (ref 70–99)
Glucose-Capillary: 324 mg/dL — ABNORMAL HIGH (ref 70–99)
Glucose-Capillary: 194 mg/dL — ABNORMAL HIGH (ref 70–99)
Glucose-Capillary: 203 mg/dL — ABNORMAL HIGH (ref 70–99)

## 2019-12-06 LAB — HEMOGLOBIN A1C
Hgb A1c MFr Bld: 11.3 % — ABNORMAL HIGH (ref 4.8–5.6)
Mean Plasma Glucose: 277.61 mg/dL

## 2019-12-06 LAB — HIV ANTIBODY (ROUTINE TESTING W REFLEX): HIV Screen 4th Generation wRfx: NONREACTIVE

## 2019-12-06 MED ORDER — DOXYCYCLINE HYCLATE 100 MG PO TABS
100.0000 mg | ORAL_TABLET | Freq: Two times a day (BID) | ORAL | Status: DC
  Administered 2019-12-06 – 2019-12-07 (×2): 100 mg via ORAL
  Filled 2019-12-06 (×2): qty 1

## 2019-12-06 MED ORDER — SODIUM CHLORIDE 0.9 % IV SOLN
INTRAVENOUS | Status: DC | PRN
  Administered 2019-12-06: 500 mL via INTRAVENOUS

## 2019-12-06 MED ORDER — INSULIN DETEMIR 100 UNIT/ML ~~LOC~~ SOLN
15.0000 [IU] | Freq: Two times a day (BID) | SUBCUTANEOUS | Status: DC
  Administered 2019-12-06 – 2019-12-07 (×3): 15 [IU] via SUBCUTANEOUS
  Filled 2019-12-06 (×4): qty 0.15

## 2019-12-06 MED ORDER — SODIUM CHLORIDE 0.9 % IV SOLN: INTRAVENOUS | Status: AC

## 2019-12-06 NOTE — Progress Notes (Signed)
Patient has an order for daily wound care.  I changed the dressing and packed areas with wet to dry dressing and wrapped with a compression wrap.

## 2019-12-06 NOTE — Progress Notes (Signed)
Family Medicine Teaching Service Daily Progress Note Intern Pager: [phone]  Patient name: [name] [name] Medical record number: [id number] Date of birth: 12-16-1985 Age: 34 y.o. Gender: male  Primary Care Provider: Patient, No Pcp Per Consultants: ortho Code Status: Full  Pt Overview and Major Events to Date:  9/18 - admitted to [place], CT with cellulitis/abscess, I&D in ED of abscess, IV abx  Assessment and Plan: [name] is a 34 y.o. male presenting with Cellulitis of Left leg . PMH is significant for Uncontrolled Type 2 DM, HTN, Bipolar Disorder, h/o osteomyelitis s/p toe amputation x2.  Cellulitis and Abscess of LLE S/p I&D in ED with removal of copious drainage. Received IV vanc overnight. Has remained afebrile and hemodynamically stable on room air. Leukocytosis improving from 14 to 13 today. Wound cultures with abundant gram positive cocci. Expect to transition to PO doxycycline today given infection control and follow up wound and blood cultures.  - Ortho consulted, appreciate recs - discontinue vanc - transition to Doxy 100mg  BID x 10 d (total) - follow up wound cultures -Tylenol 650 q6hr PRN - Miralax PRN for constipation - Carb modifed diet - Lovenox 40 - strict glucose control in setting of infection, counseling provided - wound care education - TOC for PCP needs for follow up  Uncontrolled Type 2 Diabetes Chronic, uncontrolled in setting of noncompliance. CBG's 300's overnight. Currently on Levemir 10U BID and sSSI. A1C 11.3, Lipid panel: Chol 125, HDL 27, LDL 75, Trig 117 - sSSI - Increase to Levemir 15U BID - monitor CBG's and adjust insulin as indicated  Pseudohyponatremia Stable. Na 129 this AM, CBG 326. Corrected Na 133.  - Will continue to monitor. - BMP in AM  Elevated Creatinine Stable. Cr 1.46>1.49. Unknown Baseline. Last Cr in 2018 around 1.0.  May be some CKD in setting of uncontrolled Diabetes and Hypertension.  - continue to monitor - encourage  PO hydration - avoid nephrotoxic agents - gentle hydration x 12h  Hypertension Improving. BP 140-150/80's overnight. Not on any current home medications.  Previously was Losartan, but has not taken for several months..  - continue Norvasc 10mg  QD (given AKI) - consider adding ARB when able  Neuropathic Pain Patient previously on Gabapentin 300 mg TID. - Continue Gabapentin  Normocytic Anemia Chronic, stable. Hgb 10, MCV 86 this AM. Baseline 9-10.  Last iron panel in 2018 notable for low iron (31), TIBC (188), and sat ratios (17). Ferritin 225. Denies acute bleed. Appears like anemia of chronic disease.  - continue to monitor  - Consider further work-up with repeat anemia panel if declines - transfusion threshold <7  Bipolar Disorder: Unclear type 1 vs 2. Currently not on any antipsychotic or mood stabilizing medications. Denies SI/HI. Does have childlike demeanor but overall stable - recommend outpatient psych follow up  Left foot callus: Chronic x 6 years. No signs of acute infection.  - recommend podiatry referral outpatient as it appears to cause pain/discomfort for patient  FEN/GI: Full diet Prophylaxis: Lovenox 40U  Disposition: likely 9/20  Subjective:  Doing well this AM. Denies any pain. Denies any acute concerns or complaints  Objective: Temp:  [98.2 F (36.8 C)-100.2 F (37.9 C)] 98.8 F (37.1 C) (09/19 0440) Pulse Rate:  [82-104] 104 (09/19 0440) Resp:  [11-20] 18 (09/19 0440) BP: (140-195)/(81-112) 140/81 (09/19 0440) SpO2:  [96 %-100 %] 97 % (09/19 0440) Weight:  [100 kg] 100 kg (09/18 1731) Physical Exam: General: pleasant younger male, lying comfortably in exam bed, well nourished, well  developed, in no acute distress with non-toxic appearance Resp: breathing comfortably on room air, speaking in full sentences Skin: warm, dry, erythema receding from line drawn, still warm to palpation, bandage clean and intact Extremities: warm and well perfused,  normal tone Neuro: Alert and oriented, speech normal   Laboratory: Recent Labs  Lab 12/04/19 1953 12/04/19 2001  WBC 14.7*  --   HGB 9.7* 10.2*  HCT 29.3* 30.0*  PLT 332  --    Recent Labs  Lab 12/04/19 1953 12/04/19 2001  NA 130* 133*  K 3.7 3.7  CL 94*  --   CO2 25  --   BUN 17  --   CREATININE 1.46*  --   CALCIUM 8.8*  --   PROT 7.1  --   BILITOT 0.5  --   ALKPHOS 121  --   ALT 18  --   AST 13*  --   GLUCOSE 348*  --    Lipid Panel     Component Value Date/Time   CHOL 125 12/06/2019 0749   TRIG 117 12/06/2019 0749   HDL 27 (L) 12/06/2019 0749   CHOLHDL 4.6 12/06/2019 0749   VLDL 23 12/06/2019 0749   LDLCALC 75 12/06/2019 0749   A1C: 11.3 HIV NR  Imaging/Diagnostic Tests: CT TIBIA FIBULA LEFT W CONTRAST  Result Date: 12/05/2019 CLINICAL DATA:  Left lower leg pain. EXAM: CT OF THE LOWER RIGHT EXTREMITY WITH CONTRAST TECHNIQUE: Multidetector CT imaging of the lower right extremity was performed according to the standard protocol following intravenous contrast administration. COMPARISON:  None. CONTRAST:  12mL OMNIPAQUE IOHEXOL 300 MG/ML  SOLN FINDINGS: Bones/Joint/Cartilage No acute fracture or dislocation. No periosteal reaction or bone destruction. 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 Normal alignment. No joint effusion. Ligaments Ligaments are suboptimally evaluated by CT. Muscles and Tendons Muscles are normal. No muscle atrophy. No intramuscular fluid collection or hematoma. Soft tissue Severe soft tissue edema in the subcutaneous fat of the left mid and distal lower leg along the medial aspect with severe overlying skin thickening. 4 x 2.2 x 7.3 cm complex fluid collection within the subcutaneous fat most consistent with an abscess with multiple locules of air within the fluid. No soft tissue  mass. IMPRESSION: 1. Severe soft tissue edema in the subcutaneous fat of the left mid and distal lower leg along the medial aspect with severe overlying skin thickening consistent with severe cellulitis. 4 x 2.2 x 7.3 cm abscess within the subcutaneous fat with multiple locules of air within the fluid. 2. 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 Electronically Signed   By: Kathreen Devoid   On: 12/05/2019 10:30    Danna Hefty, DO 12/06/2019, 8:07 AM PGY-3, [place] Intern pager: [phone], text pages welcome

## 2019-12-06 NOTE — Discharge Summary (Addendum)
[facility] Discharge Summary  Patient name: [name] [name] Medical record number: [id number] Date of birth: 1985/11/18 Age: 34 y.o. Gender: male Date of Admission: 12/04/2019  Date of Discharge: 12/07/19 Admitting Physician: Delora Fuel, MD  Primary Care Provider: Patient, No Pcp Per Consultants: Ortho  Indication for Hospitalization: Wound on leg  Discharge Diagnoses/Problem List:  Cellulitis of LLE Abscess of LLE uncontrolled T2DM HTN HLD  Disposition: home  Discharge Condition: stable  Discharge Exam:   Physical Exam Constitutional:      Appearance: Normal appearance.  HENT:     Head: Normocephalic and atraumatic.     Mouth/Throat:     Mouth: Mucous membranes are moist.  Cardiovascular:     Rate and Rhythm: Normal rate and regular rhythm.  Pulmonary:     Effort: Pulmonary effort is normal.     Breath sounds: Normal breath sounds.  Abdominal:     General: Abdomen is flat. Bowel sounds are normal. There is no distension.     Tenderness: There is no abdominal tenderness.  Musculoskeletal:     Right lower leg: No edema.     Left lower leg: No edema.  Skin:    General: Skin is warm.     Capillary Refill: Capillary refill takes less than 2 seconds.  Neurological:     General: No focal deficit present.     Mental Status: He is alert.     Brief Hospital Course:  [name] [name] is a 34 y.o. male with past medical history significant for uncontrolled T2DM, HTN, HLD, untreated bipolar disorder, and osteomyelitis s/p left toe amputation x2, who presented with worsening wound with surrounding erythema and warmth and found to have cellulitis and 4 x 2.2 x 7.3 cm abscess within the subcutaneous fat with multiple locules of air within the fluid on CT scan. Throughout admission he remained afebrile and hemodynamically stable. Leukocytosis only mild on admission at 14 continued to downtrend appropriately. He was started on IV vancomycin and had I&D  performed with copious bloody and purulent drainage. Wound cultures were collected and notable for GBS. He was transitioned to Doxycycline BID to complete a total of 10 days of therapy. He wound demonstrated receding erythema. He was taught proper wound care prior to discharge and discharged with recommendation to follow up closely. He was given list of PCP's to schedule at earliest convenience. Strict return precautions were discussed and patient voiced understanding and agreement with plan.   Uncontrolled Type 2 Diabetes with hyperglycemia: Patient noted to have blood sugar in 300's throughout admission. He has a history of T2DM but has been noncompliant with insulin therapy since moving from [facility] 6 months ago. A1C was elevated to 11.3 during admission. He was started on insulin therapy with improvement in blood sugars. He was educated on importance of diabetes control in setting of wound healing. He voiced understanding and agreement with plan. He was discharged on 9/20.  Hypertension: Blood pressure elevated on admission. Appears patient was not taking his home Losartan for some time. Given his concern for AKI, he was started on Amlodipine 10mg  QD with improvement in blood pressures. Was not restarted on Losartan given elevated Creatinine.  Hyperlipidemia: Patient was restarted on home Pravastatin and encouraged to continue at discharge.  Elevated Creatinine: Creatine elevated to 1.45 on admission. Last Creatinine was noted to be 1.0 in 2018. Unclear if he has developed new baseline in setting of uncontrolled diabetes and HTN. He was given gentle hydration and PO intake was encouraged.  This was monitored throughout admission and at discharge his Cr was 1.62.  Normocytic Anemia; Hgb 10.2 on admission with MCV of 86.7. Baseline appears to be 9-10. Last iron panel in 2018 notable for low iron (31), TIBC (188), and sat ratios (17). Ferritin 225. No acute bleed. Felt to be secondary to anemia of chronic  disease however recommend outpatient work up to confirm.   Issues for Follow Up:  1. Ensure continued improvement in LLE wound and cellulitis 2. Continue to monitor diabetes closely and adjust insulin regimen as indicated 3. Continue to educate importance of tight diabetes control for proper wound healing and avoidance of recurrence given his history 4. Monitor blood pressure and adjust as indicated 5. Continue to monitor kidney function to establish baseline  6. Recommend anemia work up  7. Continue to encourage medication compliance   Significant Procedures: I&D by ED provider on 9/18  Significant Labs and Imaging:  Recent Labs  Lab 12/04/19 1953 12/04/19 1953 12/04/19 2001 12/06/19 0749 12/07/19 0459  WBC 14.7*  --   --  13.0* 8.5  HGB 9.7*   < > 10.2* 10.0* 9.2*  HCT 29.3*   < > 30.0* 29.4* 27.6*  PLT 332  --   --  289 262   < > = values in this interval not displayed.   Recent Labs  Lab 12/04/19 1953 12/04/19 1953 12/04/19 2001 12/04/19 2001 12/06/19 0749 12/07/19 0459  NA 130*  --  133*  --  129* 132*  K 3.7   < > 3.7   < > 3.8 3.9  CL 94*  --   --   --  95* 97*  CO2 25  --   --   --  25 26  GLUCOSE 348*  --   --   --  368* 266*  BUN 17  --   --   --  13 16  CREATININE 1.46*  --   --   --  1.49* 1.61*  CALCIUM 8.8*  --   --   --  8.5* 8.6*  ALKPHOS 121  --   --   --   --   --   AST 13*  --   --   --   --   --   ALT 18  --   --   --   --   --   ALBUMIN 2.6*  --   --   --   --   --    < > = values in this interval not displayed.   COVID neg Aerobic culture: abundant GBC, moderate GBS HIV neg A1C 11.3  Lipid Panel     Component Value Date/Time   CHOL 125 12/06/2019 0749   TRIG 117 12/06/2019 0749   HDL 27 (L) 12/06/2019 0749   CHOLHDL 4.6 12/06/2019 0749   VLDL 23 12/06/2019 0749   LDLCALC 75 12/06/2019 0749   Urinalysis    Component Value Date/Time   COLORURINE STRAW (A) 12/05/2019 1420   APPEARANCEUR CLEAR 12/05/2019 1420   LABSPEC 1.029  12/05/2019 1420   PHURINE 6.0 12/05/2019 1420   GLUCOSEU >=500 (A) 12/05/2019 1420   HGBUR SMALL (A) 12/05/2019 1420   BILIRUBINUR NEGATIVE 12/05/2019 1420   BILIRUBINUR negative 10/29/2016 1413   KETONESUR NEGATIVE 12/05/2019 1420   PROTEINUR 100 (A) 12/05/2019 1420   UROBILINOGEN 0.2 10/29/2016 1413   NITRITE NEGATIVE 12/05/2019 1420   LEUKOCYTESUR NEGATIVE 12/05/2019 1420    DG Tibia/Fibula Left  Result Date:  12/04/2019 CLINICAL DATA:  Left leg wound. EXAM: LEFT TIBIA AND FIBULA - 2 VIEW COMPARISON:  None. FINDINGS: There is no evidence of fracture or other focal bone lesions. A mild to moderate amount of predominance subcutaneous soft tissue air is seen along the posteromedial aspect of the distal left calf. Mild-to-moderate severity focal soft tissue swelling is also seen within this region. IMPRESSION: Findings consistent with mild to moderate severity focal cellulitis, without evidence of acute osteomyelitis. Electronically Signed   By: Virgina Norfolk M.D.   On: 12/04/2019 20:39   CT TIBIA FIBULA LEFT W CONTRAST  Result Date: 12/05/2019 CLINICAL DATA:  Left lower leg pain. EXAM: CT OF THE LOWER RIGHT EXTREMITY WITH CONTRAST TECHNIQUE: Multidetector CT imaging of the lower right extremity was performed according to the standard protocol following intravenous contrast administration. COMPARISON:  None. CONTRAST:  162mL OMNIPAQUE IOHEXOL 300 MG/ML  SOLN FINDINGS: Bones/Joint/Cartilage No acute fracture or dislocation. No periosteal reaction or bone destruction. 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 Normal alignment. No joint effusion. Ligaments Ligaments are suboptimally evaluated by CT. Muscles and Tendons Muscles are normal. No muscle atrophy. No intramuscular fluid collection or hematoma. Soft tissue Severe soft tissue  edema in the subcutaneous fat of the left mid and distal lower leg along the medial aspect with severe overlying skin thickening. 4 x 2.2 x 7.3 cm complex fluid collection within the subcutaneous fat most consistent with an abscess with multiple locules of air within the fluid. No soft tissue mass. IMPRESSION: 1. Severe soft tissue edema in the subcutaneous fat of the left mid and distal lower leg along the medial aspect with severe overlying skin thickening consistent with severe cellulitis. 4 x 2.2 x 7.3 cm abscess within the subcutaneous fat with multiple locules of air within the fluid. 2. 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 Electronically Signed   By: Kathreen Devoid   On: 12/05/2019 10:30    Results/Tests Pending at Time of Discharge: None  Discharge Medications:  Allergies as of 12/07/2019   No Known Allergies     Medication List    STOP taking these medications   escitalopram 20 MG tablet Commonly known as: Lexapro   gentamicin cream 0.1 % Commonly known as: GARAMYCIN   insulin aspart 100 UNIT/ML FlexPen Commonly known as: NovoLOG FlexPen   mupirocin cream 2 % Commonly known as: Bactroban     TAKE these medications   acetaminophen 325 MG tablet Commonly known as: TYLENOL Take 2 tablets (650 mg total) by mouth every 6 (six) hours as needed for mild pain or fever (for pain).   amLODipine 10 MG tablet Commonly known as: NORVASC Take 1 tablet (10 mg total) by mouth daily. Start taking on: December 08, 2019   cephALEXin 500 MG capsule Commonly known as: KEFLEX Take 1 capsule (500 mg total) by mouth every 6 (six) hours for 9 days.   gabapentin 300 MG capsule Commonly known as: NEURONTIN Take 1 capsule (300 mg total) by mouth 3 (three) times daily. What changed:   when to take this  reasons to take  this   insulin detemir 100 UNIT/ML FlexPen Commonly known as: LEVEMIR Inject 20 Units into the skin 2 (two)  times daily. What changed: how much to take   pravastatin 20 MG tablet Commonly known as: PRAVACHOL Take 1 tablet (20 mg total) by mouth daily.            Discharge Care Instructions  (From admission, onward)         Start     Ordered   12/07/19 0000  Discharge wound care:       Comments: Pack the area with wet dressing and apply dry pad over.  Change when wet pad is dry.  Keep area clean with soap and water.  Do this until follow-up with [facility].   12/07/19 1224          Discharge Instructions: Please refer to Patient Instructions section of EMR for full details.  Patient was counseled important signs and symptoms that should prompt return to medical care, changes in medications, dietary instructions, activity restrictions, and follow up appointments.   Follow-Up Appointments:  Follow-up Information    [facility]. Go on 12/30/2019.   Why: Go to appointment at 10:50 Contact information: [address] [place] [address] [phone]              Delora Fuel, MD 12/07/2019, 4:04 PM PGY-1, [place], [place], PGY3 12/08/2019 9:27 AM

## 2019-12-06 NOTE — Plan of Care (Signed)

## 2019-12-07 LAB — BASIC METABOLIC PANEL
Anion gap: 9 (ref 5–15)
BUN: 16 mg/dL (ref 6–20)
CO2: 26 mmol/L (ref 22–32)
Calcium: 8.6 mg/dL — ABNORMAL LOW (ref 8.9–10.3)
Chloride: 97 mmol/L — ABNORMAL LOW (ref 98–111)
Creatinine, Ser: 1.61 mg/dL — ABNORMAL HIGH (ref 0.61–1.24)
GFR calc Af Amer: >60 mL/min (ref >60)
GFR calc non Af Amer: 55 mL/min — ABNORMAL LOW (ref >60)
Glucose, Bld: 266 mg/dL — ABNORMAL HIGH (ref 70–99)
Potassium: 3.9 mmol/L (ref 3.5–5.1)
Sodium: 132 mmol/L — ABNORMAL LOW (ref 135–145)

## 2019-12-07 LAB — CBC
HCT: 27.6 % — ABNORMAL LOW (ref 39.0–52.0)
Hemoglobin: 9.2 g/dL — ABNORMAL LOW (ref 13.0–17.0)
MCH: 28.4 pg (ref 26.0–34.0)
MCHC: 33.3 g/dL (ref 30.0–36.0)
MCV: 85.2 fL (ref 80.0–100.0)
Platelets: 262 10*3/uL (ref 150–400)
RBC: 3.24 MIL/uL — ABNORMAL LOW (ref 4.22–5.81)
RDW: 12.2 % (ref 11.5–15.5)
WBC: 8.5 10*3/uL (ref 4.0–10.5)
nRBC: 0 % (ref 0.0–0.2)

## 2019-12-07 LAB — GLUCOSE, CAPILLARY
Glucose-Capillary: 274 mg/dL — ABNORMAL HIGH (ref 70–99)
Glucose-Capillary: 223 mg/dL — ABNORMAL HIGH (ref 70–99)
Glucose-Capillary: 333 mg/dL — ABNORMAL HIGH (ref 70–99)

## 2019-12-07 MED ORDER — INSULIN DETEMIR 100 UNIT/ML FLEXPEN: 20.0000 [IU] | PEN_INJECTOR | Freq: Every day | SUBCUTANEOUS | 0 refills | Status: DC

## 2019-12-07 MED ORDER — INSULIN DETEMIR 100 UNIT/ML FLEXPEN: 20.0000 [IU] | PEN_INJECTOR | Freq: Two times a day (BID) | SUBCUTANEOUS | 0 refills | Status: DC

## 2019-12-07 MED ORDER — CEPHALEXIN 500 MG PO CAPS: 500.0000 mg | ORAL_CAPSULE | Freq: Four times a day (QID) | ORAL | 0 refills | Status: AC

## 2019-12-07 MED ORDER — INSULIN DETEMIR 100 UNIT/ML ~~LOC~~ SOLN
20.0000 [IU] | Freq: Two times a day (BID) | SUBCUTANEOUS | Status: DC
  Filled 2019-12-07: qty 0.2

## 2019-12-07 MED ORDER — CEPHALEXIN 500 MG PO CAPS
500.0000 mg | ORAL_CAPSULE | Freq: Four times a day (QID) | ORAL | Status: DC
  Administered 2019-12-07: 500 mg via ORAL
  Filled 2019-12-07: qty 1

## 2019-12-07 MED ORDER — AMLODIPINE BESYLATE 10 MG PO TABS
10.0000 mg | ORAL_TABLET | Freq: Every day | ORAL | 0 refills | Status: DC
Start: 2019-12-08 — End: 2019-12-30

## 2019-12-07 MED FILL — CEPHALEXIN 500 MG CAPSULE: 500 | 9 days supply | Qty: 36 | Fill #0

## 2019-12-07 MED FILL — AMLODIPINE BESYLATE 10 MG T: 10 | 30 days supply | Qty: 30 | Fill #0

## 2019-12-07 MED FILL — LEVEMIR FLEXTOUCH 100 UNITS: 100 | 30 days supply | Qty: 6 | Fill #0

## 2019-12-07 NOTE — Discharge Instructions (Signed)
It was a pleasure to be part of your care. While in the hospital you were treated for an infection of your lower leg that is due to uncontrolled blood sugar from diabetes.   If you have any of the following, please call your PCP or go to the emergency department for evaluation: - Fever greater than 101.5*F - Redness, swelling, pustulant discharge from your leg that is new or worsening - change in mental status and difficult to awaken with fast breathing - blood glucose >400  Follow Up Please go to you Pharmacy to pick up your Keflex, Insulin, and Amlodipine.  Then go to DSS to get your insurance switched to [facility].  Please establish care with community health and wellness to be seen for 10/13 at 10:50am .   [place] with soap and water once daily - Dry thoroughly - Use clean gauze as instructed by your nurse for a new dressing daily  Prescriptions - Antibiotics: please take keflex 500 mg 4 times per day until 9/29 - Blood glucose: please take levemir (long acting insulin) 20 units twice a day, please check your blood sugar at least once daily - Blood pressure: please take amlodipine 10mg  once daily to control your blood pressure    Cellulitis, Adult  Cellulitis is a skin infection. The infected area is usually warm, red, swollen, and tender. This condition occurs most often in the arms and lower legs. The infection can travel to the muscles, blood, and underlying tissue and become serious. It is very important to get treated for this condition. What are the causes? Cellulitis is caused by bacteria. The bacteria enter through a break in the skin, such as a cut, burn, insect bite, open sore, or crack. What increases the risk? This condition is more likely to occur in people who:  Have a weak body defense system (immune system).  Have open wounds on the skin, such as cuts, burns, bites, and scrapes. Bacteria can enter the body through these open wounds.  Are  older than 33 years of age.  Have diabetes.  Have a type of long-lasting (chronic) liver disease (cirrhosis) or kidney disease.  Are obese.  Have a skin condition such as: ? Itchy rash (eczema). ? Slow movement of blood in the veins (venous stasis). ? Fluid buildup below the skin (edema).  Have had radiation therapy.  Use IV drugs. What are the signs or symptoms? Symptoms of this condition include:  Redness, streaking, or spotting on the skin.  Swollen area of the skin.  Tenderness or pain when an area of the skin is touched.  Warm skin.  A fever.  Chills.  Blisters. How is this diagnosed? This condition is diagnosed based on a medical history and physical exam. You may also have tests, including:  Blood tests.  Imaging tests. How is this treated? Treatment for this condition may include:  Medicines, such as antibiotic medicines or medicines to treat allergies (antihistamines).  Supportive care, such as rest and application of cold or warm cloths (compresses) to the skin.  Hospital care, if the condition is severe. The infection usually starts to get better within 1-2 days of treatment. Follow these instructions at home:  Medicines  Take over-the-counter and prescription medicines only as told by your health care provider.  If you were prescribed an antibiotic medicine, take it as told by your health care provider. Do not stop taking the antibiotic even if you start to feel better. General instructions  Drink  enough fluid to keep your urine pale yellow.  Do not touch or rub the infected area.  Raise (elevate) the infected area above the level of your heart while you are sitting or lying down.  Apply warm or cold compresses to the affected area as told by your health care provider.  Keep all follow-up visits as told by your health care provider. This is important. These visits let your health care provider make sure a more serious infection is not  developing. Contact a health care provider if:  You have a fever.  Your symptoms do not begin to improve within 1-2 days of starting treatment.  Your bone or joint underneath the infected area becomes painful after the skin has healed.  Your infection returns in the same area or another area.  You notice a swollen bump in the infected area.  You develop new symptoms.  You have a general ill feeling (malaise) with muscle aches and pains. Get help right away if:  Your symptoms get worse.  You feel very sleepy.  You develop vomiting or diarrhea that persists.  You notice red streaks coming from the infected area.  Your red area gets larger or turns dark in color. These symptoms may represent a serious problem that is an emergency. Do not wait to see if the symptoms will go away. Get medical help right away. Call your local emergency services (911 in the [place] Do not drive yourself to the hospital. Summary  Cellulitis is a skin infection. This condition occurs most often in the arms and lower legs.  Treatment for this condition may include medicines, such as antibiotic medicines or antihistamines.  Take over-the-counter and prescription medicines only as told by your health care provider. If you were prescribed an antibiotic medicine, do not stop taking the antibiotic even if you start to feel better.  Contact a health care provider if your symptoms do not begin to improve within 1-2 days of starting treatment or your symptoms get worse.  Keep all follow-up visits as told by your health care provider. This is important. These visits let your health care provider make sure that a more serious infection is not developing. This information is not intended to replace advice given to you by your health care provider. Make sure you discuss any questions you have with your health care provider. Document Revised: 07/25/2017 Document Reviewed: 07/25/2017 Elsevier Patient Education  [place].

## 2019-12-07 NOTE — Hospital Course (Signed)
   Follow up PCP check creatinine, developed CKD?

## 2019-12-07 NOTE — Progress Notes (Signed)
DISCHARGE NOTE HOME Rylynn Schoneman to be discharged home per MD order. Discussed prescriptions and follow up appointments with the patient. Prescriptions given to patient; medication list explained in detail. Patient verbalized understanding.  Skin clean, dry and intact without evidence of skin break down, no evidence of skin tears noted. IV catheter discontinued intact. Site without signs and symptoms of complications. Dressing and pressure applied. Pt denies pain at the site currently. No complaints noted.  Patient free of lines, drains, and wounds.   An After Visit Summary (AVS) was printed and given to the patient. Patient escorted via wheelchair, and discharged home via private auto.  Jennafer Gladue S Nicholis [place], RN

## 2019-12-07 NOTE — Consult Note (Signed)
WOC consult requested for left leg.  Ortho service is already following for assessment and plan of care. Please refer to their team for further questions. Please re-consult if further assistance is needed.  Thank-you,  Julien Girt MSN, [place], [place], [place], [place]

## 2019-12-07 NOTE — Progress Notes (Signed)
Initial Nutrition Assessment  DOCUMENTATION CODES:   Obesity unspecified  INTERVENTION:   -MVI with minerals daily  NUTRITION DIAGNOSIS:   Increased nutrient needs related to wound healing as evidenced by estimated needs.  GOAL:   Patient will meet greater than or equal to 90% of their needs  MONITOR:   PO intake, Supplement acceptance, Labs, Weight trends, Skin, I & O's  REASON FOR ASSESSMENT:   Consult Assessment of nutrition requirement/status, Wound healing  ASSESSMENT:   [name] [name] is a 34 y.o. male presenting with Cellulitis of Left leg . PMH is significant for Uncontrolled Type 2 DM, HTN, Bipolar Disorder, h/o osteomyelitis s/p toe amputation x2.  Pt admitted with LLE cellulitis with abscess.   Reviewed I/O's: +890 ml x 24 hours and +2.2 L since admission  UOP: 200 ml x 24 hours  Spoke with pt at bedside, who was pleasant and in good spirits today. He shares he has a good appetite and consumed 100% of breakfast today (this is also confirmed via doc flowsheets).   PTA pt reports consuming 1 large meal and several snacks daily. Meal would usually be at dinner and consist of either hot pockets or meat, starch, and vegetable. Pt also snacks on hot pockets at potato chips. Pt shares that he lives in a boarding house and often requires assistance from his roommates for cooking tasks and some care tasks. 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   Pt denies any weight loss.   Discussed with pt importance of good meal completion to promote healing. Also discussed importance of DM self-management and control to assist with wound healing. Pt acknowledges importance of self-management ("I've already lost two toes; I don't want to lose my leg").   Lab Results  Component Value Date   HGBA1C 11.3 (H) 12/06/2019   PTA DM medications are 20 units insulin aspart BID and 30 units insulin detemir BID.   Labs reviewed: Na: 132,  CBGS: 194-223 (inpatient orders for glycemic control are 0-9 units insulin aspart TD with meals and 15 units inuslin determir BID).   NUTRITION - FOCUSED PHYSICAL EXAM:    Most Recent Value  Orbital Region No depletion  Upper Arm Region No depletion  Thoracic and Lumbar Region No depletion  Buccal Region No depletion  Temple Region No depletion  Clavicle Bone Region No depletion  Clavicle and Acromion Bone Region No depletion  Scapular Bone Region No depletion  Dorsal Hand No depletion  Patellar Region No depletion  Anterior Thigh Region No depletion  Posterior Calf Region No depletion  Edema (RD Assessment) None  Hair Reviewed  Eyes Reviewed  Mouth Reviewed  Skin Reviewed  Nails Reviewed       Diet Order:   Diet Order            Diet - low sodium heart healthy           Diet Carb Modified Fluid consistency: Thin; Room service appropriate? Yes  Diet effective now                 EDUCATION NEEDS:   Education needs have been addressed  Skin:  Skin Assessment: Skin Integrity Issues: Skin Integrity Issues:: Other (Comment) Incisions: - Other: abscess to lt lower leg  Last BM:  12/03/19  Height:   Ht Readings from Last 1 Encounters:  12/05/19 5' 6.5" (1.689 m)    Weight:   Wt Readings from Last 1  Encounters:  12/05/19 100 kg    Ideal Body Weight:  65.9 kg  BMI:  Body mass index is 35.05 kg/m.  Estimated Nutritional Needs:   Kcal:  1800-2000  Protein:  100-115 grams  Fluid:  > 1.8 L    Loistine Chance, RD, LDN, [place] Registered Dietitian II Certified Diabetes Care and Education Specialist Please refer to [facility] for RD and/or RD on-call/weekend/after hours pager

## 2019-12-07 NOTE — TOC Initial Note (Signed)
Transition of Care [facility]) - Initial/Assessment Note    Patient Details  Name: [name] [name] MRN: [id number] Date of Birth: 1985/05/18  Transition of Care [facility]) CM/SW Contact:    Bartholomew Crews, RN Phone Number: [phone] 12/07/2019, 12:07 PM  Clinical Narrative:                  Notified by MD of patient needing hospital follow up appointment and medication assistance. Patient has out of state Medicaid which TOC cannot bill d/t being out of stated. Spoke with patient at the bedside to verify his demographics. Patient had previously lived in [place] and was followed by [facility] before moving to [place] 4 years ago. Patient has moved back to [place] permanently. He states that he has switched his SSI to [place] and he thought that his Medicaid was part of that. Encouraged patient to follow up with DSS to assist with transition to [facility] Medicaid. Discussed making follow up appt with [facility], and getting prescriptions from [facility]. Patient agreeable, and asking to be discharged ASAP.   Hospital follow up appointment scheduled with [facility] for 10/13 at 10:50 am. Appointment information is on AVS. MD notified of pending appointment and to send discharge prescriptions to [facility] pharmacy.   Nursing to send wound care supplies home with patient.   TOC following for transition needs.   Expected Discharge Plan: Home/Self Care Barriers to Discharge: No Barriers Identified   Patient Goals and CMS Choice Patient states their goals for this hospitalization and ongoing recovery are:: discharge today CMS Medicare.gov Compare Post Acute Care list provided to:: Patient Choice offered to / list presented to : NA  Expected Discharge Plan and Services Expected Discharge Plan: Home/Self Care In-house Referral: NA Discharge Planning Services: CM Consult Post Acute Care Choice: NA Living arrangements for the past 2 months: Single Family Home                 DME Arranged: N/A DME Agency: NA       HH Arranged: NA HH Agency: NA         Prior Living Arrangements/Services Living arrangements for the past 2 months: Single Family Home Lives with:: Self Patient language and need for interpreter reviewed:: Yes        Need for Family Participation in Patient Care: No (Comment)     Criminal Activity/Legal Involvement Pertinent to Current Situation/Hospitalization: No - Comment as needed  Activities of Daily Living      Permission Sought/Granted                  Emotional Assessment Appearance:: Appears stated age Attitude/Demeanor/Rapport: Engaged Affect (typically observed): Accepting Orientation: : Oriented to Self, Oriented to  Time, Oriented to Place, Oriented to Situation Alcohol / Substance Use: Not Applicable Psych Involvement: No (comment)  Admission diagnosis:  Abscess [L02.91] Cellulitis [L03.90] Cellulitis of left lower extremity [L03.116] Patient Active Problem List   Diagnosis Date Noted  . Abscess   . Cellulitis 12/05/2019  . Cellulitis of left lower extremity   . Abscess of left lower extremity   . Uncontrolled type 2 diabetes mellitus with hyperglycemia ([place])   . 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 2 diabetes mellitus ([place])   . Cellulitis of foot, left 11/22/2016  . Diabetic neuropathy ([place]) 11/22/2016  . Fracture of 2nd metatarsal 11/22/2016  . Fracture of 3rd metatarsal 11/22/2016  . Postprocedural non-healing wound 11/22/2016  . Acute cystitis with positive culture   . Cutaneous abscess of left foot   .  Osteomyelitis ([place]) 11/03/2016  . Poorly controlled type 2 diabetes mellitus with complication ([place]) 40/37/5436  . Hyperlipidemia 10/17/2016  . Depression 10/17/2016  . Neck pain   . Asthma 04/11/2016  . Uncontrolled type 2 diabetes mellitus with complication ([place])   . Obesity, morbid ([place]) 09/06/2015  . Hypertension 09/06/2015   PCP:  Patient, No Pcp Per Pharmacy:   [place] ([place]), [place] - [address] [place] ([place]) [place]  [address] Phone: [phone] Fax: [place], [place] [address] [place] [address] Phone: [phone] Fax: [phone]     Social Determinants of Health (SDOH) Interventions    Readmission Risk Interventions No flowsheet data found.

## 2019-12-07 NOTE — Progress Notes (Addendum)
Patient doing well today with no concerns.  Exam: Surrounding left LL erythema improved. Clean wound with no discharge.   His left LL cellulitis secondary to poorly controlled DM2: Improved s/p I&D and Vanc.  Wound culture grew GBS. Hence we switched his regimen from Doxycycline to Keflex. He is comfortable with self-wound dressing at home with outpatient PCP monitoring.  DM2: Uncontrol. Increase Levemir to 20 units BID F/U with PCP in 1-2 days for dose adjustment.  AKI: Worsened likely due to Vanc A/B. S/P IVF. Can self hydrate at home and repeat Bmet at his next PCP appointment in 1-2 days.  HTN: Improved on Norvasc.  Dispo: Home today.

## 2019-12-08 DIAGNOSIS — IMO0002 Reserved for concepts with insufficient information to code with codable children: Secondary | ICD-10-CM

## 2019-12-09 LAB — CULTURE, BLOOD (ROUTINE X 2)
Culture: NO GROWTH
Special Requests: ADEQUATE

## 2019-12-09 LAB — AEROBIC CULTURE W GRAM STAIN (SUPERFICIAL SPECIMEN): Gram Stain: NONE SEEN

## 2019-12-30 ENCOUNTER — Other Ambulatory Visit: Payer: Self-pay | Admitting: Pharmacist

## 2019-12-30 ENCOUNTER — Other Ambulatory Visit: Payer: Self-pay

## 2019-12-30 ENCOUNTER — Ambulatory Visit: Payer: Medicaid Other | Admitting: Physician Assistant

## 2019-12-30 ENCOUNTER — Other Ambulatory Visit: Payer: Self-pay | Admitting: Family Medicine

## 2019-12-30 ENCOUNTER — Ambulatory Visit: Payer: Medicaid Other | Attending: Physician Assistant | Admitting: Physician Assistant

## 2019-12-30 DIAGNOSIS — E1165 Type 2 diabetes mellitus with hyperglycemia: Secondary | ICD-10-CM

## 2019-12-30 DIAGNOSIS — H539 Unspecified visual disturbance: Secondary | ICD-10-CM

## 2019-12-30 DIAGNOSIS — E1142 Type 2 diabetes mellitus with diabetic polyneuropathy: Secondary | ICD-10-CM

## 2019-12-30 DIAGNOSIS — IMO0002 Reserved for concepts with insufficient information to code with codable children: Secondary | ICD-10-CM

## 2019-12-30 DIAGNOSIS — E118 Type 2 diabetes mellitus with unspecified complications: Secondary | ICD-10-CM

## 2019-12-30 DIAGNOSIS — Z09 Encounter for follow-up examination after completed treatment for conditions other than malignant neoplasm: Secondary | ICD-10-CM

## 2019-12-30 DIAGNOSIS — E785 Hyperlipidemia, unspecified: Secondary | ICD-10-CM

## 2019-12-30 DIAGNOSIS — I10 Essential (primary) hypertension: Secondary | ICD-10-CM

## 2019-12-30 DIAGNOSIS — Z794 Long term (current) use of insulin: Secondary | ICD-10-CM

## 2019-12-30 DIAGNOSIS — L03116 Cellulitis of left lower limb: Secondary | ICD-10-CM | POA: Diagnosis not present

## 2019-12-30 MED ORDER — TRUE METRIX METER W/DEVICE KIT: 1.0000 | PACK | Freq: Two times a day (BID) | 0 refills | Status: DC

## 2019-12-30 MED ORDER — TRUE METRIX BLOOD GLUCOSE TEST VI STRP: ORAL_STRIP | 12 refills | Status: DC

## 2019-12-30 MED ORDER — AMLODIPINE BESYLATE 10 MG PO TABS: 10.0000 mg | ORAL_TABLET | Freq: Every day | ORAL | 0 refills | Status: DC

## 2019-12-30 MED ORDER — TRUEPLUS LANCETS 28G MISC: 1.0000 | Freq: Two times a day (BID) | 1 refills | Status: DC

## 2019-12-30 MED ORDER — GABAPENTIN 300 MG PO CAPS: 300.0000 mg | ORAL_CAPSULE | Freq: Three times a day (TID) | ORAL | 2 refills | Status: DC | PRN

## 2019-12-30 MED ORDER — INSULIN DETEMIR 100 UNIT/ML FLEXPEN: 20.0000 [IU] | PEN_INJECTOR | Freq: Two times a day (BID) | SUBCUTANEOUS | 3 refills | Status: DC

## 2019-12-30 MED ORDER — PRAVASTATIN SODIUM 20 MG PO TABS: 20.0000 mg | ORAL_TABLET | Freq: Every day | ORAL | 3 refills | Status: DC

## 2019-12-30 MED ORDER — PEN NEEDLES 31G X 5 MM MISC: 1.0000 | Freq: Two times a day (BID) | 2 refills | Status: DC

## 2019-12-30 MED ORDER — ACCU-CHEK SOFTCLIX LANCETS MISC: 2 refills | Status: DC

## 2019-12-30 MED FILL — PRAVASTATIN SODIUM 20 MG TA: 20 | 90 days supply | Qty: 90 | Fill #0

## 2019-12-30 MED FILL — ACCU-CHEK GUIDE W/DEVICE KI: W/DEVICE | 1 days supply | Qty: 1 | Fill #0

## 2019-12-30 MED FILL — ACCU-CHEK SOFTCLIX LANCETS: 50 days supply | Qty: 100 | Fill #0

## 2019-12-30 MED FILL — AMLODIPINE BESYLATE 10 MG T: 10 | 30 days supply | Qty: 30 | Fill #0

## 2019-12-30 MED FILL — TECHLITE PEN NEEDLES 31G X: 31G X 5 MM | 50 days supply | Qty: 100 | Fill #0

## 2019-12-30 MED FILL — ACCU-CHEK GUIDE TEST STRIP: 25 days supply | Qty: 100 | Fill #0

## 2019-12-30 MED FILL — GABAPENTIN 300 MG CAPSULE: 300 | 30 days supply | Qty: 90 | Fill #0

## 2019-12-30 NOTE — Progress Notes (Signed)
Referral to eye doctor  Left eye cloudy limited visibility

## 2019-12-30 NOTE — Progress Notes (Signed)
Patient ID: [name] [name], male   DOB: [birth year], 34 y.o.   MRN: [id number] Patient ID: [name] [name], male   DOB: 1985/12/09, 34 y.o.   MRN: [id number] Virtual Visit via Telephone Note  I connected with Wilmer Floor on 12/30/19 at  2:10 PM EDT by telephone and verified that I am speaking with the correct person using two identifiers.   I discussed the limitations, risks, security and privacy concerns of performing an evaluation and management service by telephone and the availability of in person appointments. I also discussed with the patient that there may be a patient responsible charge related to this service. The patient expressed understanding and agreed to proceed.  PATIENT visit by telephone virtually in the context of Covid-19 pandemic. Patient location:  home My Location:  [facility] office Persons on the call: me and the patient.  Roomed by Elmon Else  History of Present Illness: After hospitalization 9/17-9/20/2021 for cellulitis.  He is wheelchair bound and has trouble getting around.  He says his infection has cleared.  No fever.  No pain.  He has not been checking his blood sugars.  He is not taking any diabetes meds he was prescribed.  His is having blurry vision and has not seen an ophthalmologist in a while.    From discharge summary: Brief Hospital Course:  [name] [name] is a 34 y.o. male with past medical history significant for uncontrolled T2DM, HTN, HLD, untreated bipolar disorder, and osteomyelitis s/p left toe amputation x2, who presented with worsening wound with surrounding erythema and warmth and found to have cellulitis and4 x 2.2 x 7.3 cm abscess within the subcutaneous fat with multiple locules of air within the fluid on CT scan. Throughout admission he remained afebrile and hemodynamically stable. Leukocytosis only mild on admission at 14 continued to downtrend appropriately. He was started on IV vancomycin and had I&D performed with copious bloody and purulent drainage.  Wound cultures were collected and notable for GBS. He was transitioned to Doxycycline BID to complete a total of 10 days of therapy. He wound demonstrated receding erythema. He was taught proper wound care prior to discharge and discharged with recommendation to follow up closely. He was given list of PCP's to schedule at earliest convenience. Strict return precautions were discussed and patient voiced understanding and agreement with plan.   Uncontrolled Type 2 Diabetes with hyperglycemia: Patient noted to have blood sugar in 300's throughout admission. He has a history of T2DM but has been noncompliant with insulin therapy since moving from [facility] 6 months ago. A1C was elevated to 11.3 during admission. He was started on insulin therapy with improvement in blood sugars. He was educated on importance of diabetes control in setting of wound healing. He voiced understanding and agreement with plan. He was discharged on 9/20.  Hypertension: Blood pressure elevated on admission. Appears patient was not taking his home Losartan for some time. Given his concern for AKI, he was started on Amlodipine 71m QD with improvement in blood pressures. Was not restarted on Losartan given elevated Creatinine.  Hyperlipidemia: Patient was restarted on home Pravastatin and encouraged to continue at discharge.  Elevated Creatinine: Creatine elevated to 1.45 on admission. Last Creatinine was noted to be 1.0 in 2018. Unclear if he has developed new baseline in setting of uncontrolled diabetes and HTN. He was given gentle hydration and PO intake was encouraged. This was monitored throughout admission and at discharge his Cr was 1.62.  Normocytic Anemia; Hgb 10.2 on admission with MCV of 86.7.  Baseline appears to be 9-10. Last iron panel in 2018 notable for low iron (31), TIBC (188), and sat ratios (17). Ferritin 225. No acute bleed. Felt to be secondary to anemia of chronic disease however recommend outpatient work up to  confirm.   Issues for Follow Up:  1. Ensure continued improvement in LLE wound and cellulitis 2. Continue to monitor diabetes closely and adjust insulin regimen as indicated 3. Continue to educate importance of tight diabetes control for proper wound healing and avoidance of recurrence given his history 4. Monitor blood pressure and adjust as indicated 5. Continue to monitor kidney function to establish baseline  6. Recommend anemia work up  7. Continue to encourage medication compliance   Significant Procedures: I&D by ED provider on 9/18  Significant Labs and Imaging:  Last Labs          Recent Labs  Lab 12/04/19 1953 12/04/19 1953 12/04/19 2001 12/06/19 0749 12/07/19 0459  WBC 14.7*  --   --  13.0* 8.5  HGB 9.7*   < > 10.2* 10.0* 9.2*  HCT 29.3*   < > 30.0* 29.4* 27.6*  PLT 332  --   --  289 262   < > = values in this interval not displayed.     Last Labs           Recent Labs  Lab 12/04/19 1953 12/04/19 1953 12/04/19 2001 12/04/19 2001 12/06/19 0749 12/07/19 0459  NA 130*  --  133*  --  129* 132*  K 3.7   < > 3.7   < > 3.8 3.9  CL 94*  --   --   --  95* 97*  CO2 25  --   --   --  25 26  GLUCOSE 348*  --   --   --  368* 266*  BUN 17  --   --   --  13 16  CREATININE 1.46*  --   --   --  1.49* 1.61*  CALCIUM 8.8*  --   --   --  8.5* 8.6*  ALKPHOS 121  --   --   --   --   --   AST 13*  --   --   --   --   --   ALT 18  --   --   --   --   --   ALBUMIN 2.6*  --   --   --   --   --    < > = values in this interval not displayed.     COVID neg Aerobic culture: abundant GBC, moderate GBS HIV neg A1C 11.3    Observations/Objective:  NAD.  A&Ox3   Assessment and Plan: Not taking any meds currently and does not have glucometer: 1. Uncontrolled type 2 diabetes mellitus with complication, with long-term current use of insulin (HCC) Check blood sugars 2-3 times daily and see Luke in 3 weeks - Blood Glucose Monitoring Suppl (TRUE METRIX METER) w/Device  KIT; 1 each by Does not apply route in the morning and at bedtime.  Dispense: 1 kit; Refill: 0 - glucose blood (TRUE METRIX BLOOD GLUCOSE TEST) test strip; Use as instructed  Dispense: 100 each; Refill: 12 - insulin detemir (LEVEMIR) 100 UNIT/ML FlexPen; Inject 20 Units into the skin 2 (two) times daily.  Dispense: 15 mL; Refill: 3 - Insulin Pen Needle (PEN NEEDLES) 31G X 5 MM MISC; 1 each by Does not apply route in the morning and  at bedtime.  Dispense: 100 each; Refill: 2 - TRUEplus Lancets 28G MISC; 1 each by Does not apply route in the morning and at bedtime.  Dispense: 100 each; Refill: 1 - Ambulatory referral to Ophthalmology  2. Cellulitis of left lower extremity resolved  3. Hospital discharge follow-up - Ambulatory referral to Social Work  4. Vision changes Likely due to #1 - Ambulatory referral to Ophthalmology  5. Hyperlipidemia, unspecified hyperlipidemia type - pravastatin (PRAVACHOL) 20 MG tablet; Take 1 tablet (20 mg total) by mouth daily.  Dispense: 90 tablet; Refill: 3  6. Primary hypertension Check in 3 weeks with Lurena Joiner - amLODipine (NORVASC) 10 MG tablet; Take 1 tablet (10 mg total) by mouth daily.  Dispense: 90 tablet; Refill: 0  7. Diabetic polyneuropathy associated with type 2 diabetes mellitus (HCC) - gabapentin (NEURONTIN) 300 MG capsule; Take 1 capsule (300 mg total) by mouth 3 (three) times daily as needed (pain).  Dispense: 90 capsule; Refill: 2    Follow Up Instructions: See Lurena Joiner in 3 weeks with BP and DM;  Assign PCP in about 8 weeks   I discussed the assessment and treatment plan with the patient. The patient was provided an opportunity to ask questions and all were answered. The patient agreed with the plan and demonstrated an understanding of the instructions.   The patient was advised to call back or seek an in-person evaluation if the symptoms worsen or if the condition fails to improve as anticipated.  I provided 22 minutes of non-face-to-face  time during this encounter.   Freeman Caldron, PA-C

## 2020-01-11 ENCOUNTER — Telehealth: Payer: Self-pay

## 2020-01-11 NOTE — Telephone Encounter (Signed)
Please help pt if possible.  Copied from [place] [phone]. Topic: General - Other >> Jan 11, 2020  1:53 PM Alanda Slim E wrote: Reason for CRM: Nurse from [facility] and consultation did a Home care evaluation and they need to come by office to have Pts Provider sign the orders / please advise if PCP can sign and when she can stop by

## 2020-01-12 ENCOUNTER — Telehealth: Payer: Self-pay

## 2020-01-12 NOTE — Telephone Encounter (Signed)
Call returned to [facility] [phone].  She explained that she had an NP from her agency assess the patient for personal care services and would like an MD at [facility] to sign the form.  She said that the patient does not have [place] medicaid.    This CM explained to her that the patient needs [place] medicaid for PCS. After he is approved for medicaid, his provider's office will refer the patient for PCS through [facility] if the provider believes that the patient requires assistance with personal care.  [facility] then does the assessment to determine eligibility for services and if approved, the patient selects the agency he would like to provide the services. Blondie stated that she will follow up with the patient regarding applying for [facility] medicaid

## 2020-01-13 ENCOUNTER — Telehealth: Payer: Self-pay | Admitting: Licensed Clinical Social Worker

## 2020-01-13 NOTE — Telephone Encounter (Signed)
MSW Intern called to schedule an appt with Pt due to PCP referral. Pt answered and appt was made for the following week to discuss transportation options and resource referrals.

## 2020-01-21 ENCOUNTER — Telehealth: Payer: Self-pay

## 2020-01-21 ENCOUNTER — Ambulatory Visit: Payer: Medicaid Other | Attending: Family Medicine | Admitting: Licensed Clinical Social Worker

## 2020-01-21 ENCOUNTER — Other Ambulatory Visit: Payer: Self-pay

## 2020-01-21 DIAGNOSIS — F331 Major depressive disorder, recurrent, moderate: Secondary | ICD-10-CM

## 2020-01-21 NOTE — Telephone Encounter (Signed)
This CM spoke to patient along with Christa See, LCSW.  He explained that he received a new power chair 07/2019 when he was in [place] and he is now having problems with the chair. Instructed him to contact the company that delivered the chair to him and inquire about who he needs to contact to discuss getting the chair repaired now that he is in [place].  He wanted to check on the status of the ophthalmology referral.  It was placed by Epimenio Sarin, PA.  He now has [facility] Medicaid effective 01/18/2020 as per Alinda Sierras Soler/[facility] referral specialist, one ophthalmology office declined the referral stating that they were not accepting any more medicaid patients.  She will now send the referral to another ophthalmology office.   The patient is requesting home PT and OT.   Scheduled him for an appointment 01/27/2020 to re-establish care at [facility] with Dr Chapman Fitch.  He can discuss his questions about home therapy and ophthalmology with the provider at that time.   He also reported not taking all of his medications as ordered.  Instructed him to bring all of the medications that he has to his appointment next week for provider to review and he said he understood,

## 2020-01-21 NOTE — BH Specialist Note (Signed)
Integrated Behavioral Health Visit via Telemedicine (Telephone)  01/21/2020 [name] [name] [id number]  Number of [place] visits: 1 Session Start time: 11:10 AM  Session End time: 11:30 AM Total time: 20 minutes  Referring Provider: Dr. Chapman Fitch Type of Service: Individual Patient location: Home [facility] Provider location: Office All persons participating in visit: LCSW and Patient   I connected with [name] [name] by telephone and verified that I am speaking with the correct person using two identifiers.   Discussed confidentiality: Yes   Confirmed demographics & insurance:  Yes   I discussed that engaging in this virtual visit, they consent to the provision of behavioral healthcare and the services will be billed under their insurance.   Patient and/or legal guardian expressed understanding and consented to virtual visit: Yes   PRESENTING CONCERNS: Patient or family reports the following symptoms/concerns:  Pt reports feelings of depression due to ongoing medical conditions and limited mobility. He is interested in obtaining physical and occupational therapy, which he was unable to participate in due to delayed scheduling Duration of problem: Ongoing; Severity of problem: moderate  STRENGTHS (Protective Factors/Coping Skills): Social connections, Social and Emotional competence and Concrete supports in place (healthy food, safe environments, etc.) Pt receives support from daughters and spouse. He receives SSI and medicaid (medicaid has been transferred to [facility] but not food stamps-waiting for termination letter)  ASSESSMENT: Patient currently experiencing symptoms of depression triggered by psychosocial stressors and difficulty managing medical health conditions.  Pt will benefit from supportive resources, therapy, and medication management. Pt is currently interested in referrals to strengthen mobility.      GOALS ADDRESSED: Patient will: 1.  Increase knowledge and/or  ability of: coping skills Pt agreed to utilize healthy coping skills discussed  2.  Demonstrate ability to: Increase healthy adjustment to current life circumstances and Increase adequate support systems for patient/family Pt agreed to follow up on transportation resources discussed.   Progress of Goals: Ongoing  INTERVENTIONS: Interventions utilized:  Solution-Focused Strategies and Link to [employer] Standardized Assessments completed & reviewed: Not Needed   OUTCOME: Patient Response: Pt was engaged during session. Pt was successful in identifying healthy coping skills to assist in management of symptoms. Pt was provided information on transportation resources and will discuss interest in referrals with provider.   PLAN: 1. Follow up with behavioral health clinician on : Schedule follow up appt.  2. Behavioral recommendations: utilize strategies and resources provided. LCSW will follow up with Referral Coordinator regarding referrals 3. Referral(s): [place] (In Clinic)  I discussed the assessment and treatment plan with the patient and/or parent/guardian. They were provided an opportunity to ask questions and all were answered. They agreed with the plan and demonstrated an understanding of the instructions.   They were advised to call back or seek an in-person evaluation as appropriate.  I discussed that the purpose of this visit is to provide behavioral health care while limiting exposure to the novel coronavirus.  Discussed there is a possibility of technology failure and discussed alternative modes of communication if that failure occurs.  Rebekah [place], LCSW 02/05/2020 10:55 PM

## 2020-01-27 ENCOUNTER — Ambulatory Visit: Payer: Medicaid Other | Attending: Family Medicine | Admitting: Family Medicine

## 2020-01-27 ENCOUNTER — Other Ambulatory Visit: Payer: Self-pay

## 2020-01-27 ENCOUNTER — Encounter: Payer: Self-pay | Admitting: Family Medicine

## 2020-01-27 VITALS — BP 157/95 | HR 88 | Wt 225.8 lb

## 2020-01-27 DIAGNOSIS — E785 Hyperlipidemia, unspecified: Secondary | ICD-10-CM

## 2020-01-27 DIAGNOSIS — Z79899 Other long term (current) drug therapy: Secondary | ICD-10-CM

## 2020-01-27 DIAGNOSIS — Z794 Long term (current) use of insulin: Secondary | ICD-10-CM

## 2020-01-27 DIAGNOSIS — N1831 Chronic kidney disease, stage 3a: Secondary | ICD-10-CM

## 2020-01-27 DIAGNOSIS — Z89412 Acquired absence of left great toe: Secondary | ICD-10-CM

## 2020-01-27 DIAGNOSIS — Z86018 Personal history of other benign neoplasm: Secondary | ICD-10-CM

## 2020-01-27 DIAGNOSIS — E118 Type 2 diabetes mellitus with unspecified complications: Secondary | ICD-10-CM

## 2020-01-27 DIAGNOSIS — H539 Unspecified visual disturbance: Secondary | ICD-10-CM

## 2020-01-27 DIAGNOSIS — I10 Essential (primary) hypertension: Secondary | ICD-10-CM | POA: Diagnosis not present

## 2020-01-27 DIAGNOSIS — Z89422 Acquired absence of other left toe(s): Secondary | ICD-10-CM

## 2020-01-27 DIAGNOSIS — E1142 Type 2 diabetes mellitus with diabetic polyneuropathy: Secondary | ICD-10-CM | POA: Diagnosis not present

## 2020-01-27 DIAGNOSIS — IMO0002 Reserved for concepts with insufficient information to code with codable children: Secondary | ICD-10-CM

## 2020-01-27 DIAGNOSIS — S81802A Unspecified open wound, left lower leg, initial encounter: Secondary | ICD-10-CM

## 2020-01-27 DIAGNOSIS — E1165 Type 2 diabetes mellitus with hyperglycemia: Secondary | ICD-10-CM

## 2020-01-27 LAB — POCT URINALYSIS DIP (CLINITEK)
Bilirubin, UA: NEGATIVE
Glucose, UA: >=1000 mg/dL — AB
Ketones, POC UA: NEGATIVE mg/dL
Leukocytes, UA: NEGATIVE
Nitrite, UA: NEGATIVE
POC PROTEIN,UA: >=300 — AB
Spec Grav, UA: 1.025
Urobilinogen, UA: 0.2 U/dL
pH, UA: 5.5

## 2020-01-27 LAB — GLUCOSE, POCT (MANUAL RESULT ENTRY)
POC Glucose: 374 mg/dL — AB (ref 70–99)
POC Glucose: 361 mg/dl — AB (ref 70–99)
POC Glucose: 372 mg/dL — AB (ref 70–99)

## 2020-01-27 MED ORDER — INSULIN ASPART 100 UNIT/ML ~~LOC~~ SOLN
12.0000 [IU] | Freq: Once | SUBCUTANEOUS | Status: AC
  Administered 2020-01-27: 12 [IU] via SUBCUTANEOUS

## 2020-01-27 MED ORDER — INSULIN LISPRO (1 UNIT DIAL) 100 UNIT/ML (KWIKPEN): PEN_INJECTOR | SUBCUTANEOUS | 11 refills | Status: DC

## 2020-01-27 MED ORDER — DOXYCYCLINE HYCLATE 100 MG PO TABS: 100.0000 mg | ORAL_TABLET | Freq: Two times a day (BID) | ORAL | 0 refills | Status: DC

## 2020-01-27 MED FILL — DOXYCYCLINE HYCLATE 100 MG: 100 | 10 days supply | Qty: 20 | Fill #0

## 2020-01-27 NOTE — Progress Notes (Signed)
New Patient Office Visit  Subjective:  Patient ID: [name] [name], male    DOB: 31-Dec-1985  Age: 34 y.o. MRN: [id number]  CC:  Chief Complaint  Patient presents with  . Establish Care    HPI [name] [name], 34 yo male who is status post telemedicine visit on 12/30/2019 with [facility] status post recent hospitalization.  Patient during hospitalization had hemoglobin A1c of 11.3 on 12/06/2019.  Patient was admitted from 12/04/2019 through 12/07/2019 due to abscess and cellulitis of the left lower extremity, uncontrolled type 2 diabetes, hypertension, bipolar disorder and hyperlipidemia.  He additionally has history of osteomyelitis status post left toe amputation x2.  Patient's blood sugars during his recent admission remained in the 300s and A1c was 11.3 as he had been noncompliant with insulin therapy for approximately 6 months prior to his hospitalization.  Prior to hospitalization patient had reported use of losartan however due to elevated creatinine during his hospitalization, he was started on amlodipine 10 mg daily for blood pressure control with discontinuation of losartan.  Creatinine on 12/07/2019 was 1.61.        Patient reports that he has moved to the area recently after living in [place].  He reports that while in [place] he had surgery for removal of a benign spinal tumor but this caused him to have right-sided weakness.  He also reports prior surgery for removal of the right great and second toe secondary to infection.  He has also had recent hospitalization in this area in September due to skin infection of the left lower leg.  He reports that he has completed antibiotic therapy.  The area has not completely healed.  He reports no increased pain, redness or drainage from the site.  He reports that in the past, he was on short acting insulin and took 10 units before each meal but he states that he was not prescribed this medication at the time of his recent hospital discharge.  He also  reports that he is somewhat confused about his dose of long-acting insulin from his hospitalization.  Past Medical History:  Diagnosis Date  . Arthritis    "neck; maybe in my fingers" (11/06/2016)  . Asthma   . Bipolar disorder ([place])    "IED: intentional deficit disorder"  . Depression   . Heart murmur    "when I was a child"  . Hypertension   . Type II diabetes mellitus ([place])     Past Surgical History:  Procedure Laterality Date  . AMPUTATION Left 11/05/2016   Procedure: AMPUTATION LEFT GREAT TOE;  Surgeon: Newt Minion, MD;  Location: [facility];  Service: Orthopedics;  Laterality: Left;  . DRESSING CHANGE UNDER ANESTHESIA N/A 04/13/2016   Procedure: DRESSING CHANGE UNDER ANESTHESIA;  Surgeon: Johnathan Hausen, MD;  Location: [facility];  Service: General;  Laterality: N/A;  . INCISION AND DRAINAGE ABSCESS Right 09/03/2015   Procedure: INCISION AND DRAINAGE ABSCESS;  Surgeon: Roseanne Kaufman, MD;  Location: [facility];  Service: Orthopedics;  Laterality: Right;  . INCISION AND DRAINAGE PERIRECTAL ABSCESS N/A 04/11/2016   Procedure: IRRIGATION AND DEBRIDEMENT RECTAL ABSCESS;  Surgeon: Johnathan Hausen, MD;  Location: [facility];  Service: General;  Laterality: N/A;  . Foley; 1989   x2   . IRRIGATION AND DEBRIDEMENT ABSCESS Left 08/31/2015   Procedure: IRRIGATION AND DEBRIDEMENT ABSCESS LEFT SHOULDER;  Surgeon: Alphonsa Overall, MD;  Location: [facility];  Service: General;  Laterality: Left;  . WOUND DEBRIDEMENT N/A 02/24/2016  Procedure: SACRAL WOUND DEBRIDEMENT;  Surgeon: Georganna Skeans, MD;  Location[facility];  Service: General;  Laterality: N/A;    Family History  Problem Relation Age of Onset  . Diabetes type II Mother   . Diabetes type II Father     Social History   Socioeconomic History  . Marital status: Married    Spouse name: Not on file  . Number of children: Not on file  . Years of education: Not on file  . Highest education level: Not on file  Occupational History  .  Not on file  Tobacco Use  . Smoking status: Never Smoker  . Smokeless tobacco: Never Used  Vaping Use  . Vaping Use: Never used  Substance and Sexual Activity  . Alcohol use: Yes    Comment: 11/06/2016 "I'll drink a little at parties; maybe 5 times/year"  . Drug use: Yes    Types: Marijuana    Comment: 11/06/2016 "stopped ~ 1 month ago"  . Sexual activity: Never    Birth control/protection: Diaphragm  Other Topics Concern  . Not on file  Social History Narrative  . Not on file   Social Determinants of Health   Financial Resource Strain:   . Difficulty of Paying Living Expenses: Not on file  Food Insecurity:   . Worried About Charity fundraiser in the Last Year: Not on file  . Ran Out of Food in the Last Year: Not on file  Transportation Needs:   . Lack of Transportation (Medical): Not on file  . Lack of Transportation (Non-Medical): Not on file  Physical Activity:   . Days of Exercise per Week: Not on file  . Minutes of Exercise per Session: Not on file  Stress:   . Feeling of Stress : Not on file  Social Connections:   . Frequency of Communication with Friends and Family: Not on file  . Frequency of Social Gatherings with Friends and Family: Not on file  . Attends Religious Services: Not on file  . Active Member of Clubs or Organizations: Not on file  . Attends Archivist Meetings: Not on file  . Marital Status: Not on file  Intimate Partner Violence:   . Fear of Current or Ex-Partner: Not on file  . Emotionally Abused: Not on file  . Physically Abused: Not on file  . Sexually Abused: Not on file    ROS Review of Systems  Constitutional: Positive for fatigue. Negative for chills and unexpected weight change.  Eyes: Positive for visual disturbance. Negative for photophobia.  Respiratory: Negative for cough and shortness of breath.   Cardiovascular: Negative for chest pain.  Gastrointestinal: Negative for abdominal pain, blood in stool, constipation,  diarrhea and nausea.  Endocrine: Positive for polydipsia and polyuria. Negative for polyphagia.  Genitourinary: Negative for dysuria and frequency.  Musculoskeletal: Positive for gait problem and neck stiffness.  Skin: Positive for wound. Negative for rash.  Neurological: Positive for numbness. Negative for dizziness and headaches.  Hematological: Negative for adenopathy. Does not bruise/bleed easily.  Psychiatric/Behavioral: Negative for suicidal ideas. The patient is nervous/anxious.     Objective:   Today's Vitals: BP (!) 157/95 (BP Location: Left Arm, Patient Position: Sitting)   Pulse 88   Wt 225 lb 12.8 oz (102.4 kg)   SpO2 99%   BMI 35.90 kg/m   Physical Exam Vitals and nursing note reviewed.  Constitutional:      General: He is not in acute distress.    Appearance: Normal appearance.  Neck:     Vascular: No carotid bruit.     Comments: Patient with a healed surgical scar extending from the posterior scalp/neck to the upper thoracic area resulting in decreased range of motion of the neck Cardiovascular:     Rate and Rhythm: Normal rate and regular rhythm.  Pulmonary:     Effort: Pulmonary effort is normal.     Breath sounds: Normal breath sounds.  Abdominal:     Palpations: Abdomen is soft.     Tenderness: There is no abdominal tenderness. There is no right CVA tenderness, left CVA tenderness, guarding or rebound.  Musculoskeletal:        General: Deformity (absence of left great and second toe) present.     Cervical back: No tenderness.     Right lower leg: No edema.     Left lower leg: No edema.  Lymphadenopathy:     Cervical: No cervical adenopathy.  Skin:    General: Skin is warm and dry.     Findings: Lesion (Poorly healing wound medial left lower leg; patient also with thickened) present.     Comments:  callus or bony nodule type growth above the left lateral ankle  Neurological:     Mental Status: He is alert and oriented to person, place, and time.      Motor: Weakness (Right upper and lower extremity weakness along with right foot drop) present.  Psychiatric:        Behavior: Behavior normal.     Comments: Slightly flattened affect, slight paucity of expression     Assessment & Plan:  1. Uncontrolled type 2 diabetes mellitus with complication, with long-term current use of insulin ([place]); diabetic polyneuropathy associated with type 2 diabetes; 5.  Encounter for long-term use of medications Patient with a hemoglobin A1c of 11.3 during recent hospitalization in September.  At today's visit his glucose is elevated at 361 and he will be given 12 units of regular insulin while here in the clinic.  He will have comprehensive metabolic panel and follow-up of his diabetes as well as urine microalbumin creatinine ratio and urinalysis to look for presence of ketones.  He will have referral to ophthalmology for diabetic eye exam as he also has some complaints of changes in vision.  Additionally, he will be referred to podiatry due to history of prior toe amputations as well as a growth over the left lateral malleolus.  Patient additionally with presence of right foot drop.  Patient continues to have a nonhealing wound status post I&D and inpatient treatment for abscess on the left lower leg.  He has been referred to wound care and discussed with the patient that as long as his blood sugars remain uncontrolled that he will continue to have issues with poor wound healing.  He is being restarted on doxycycline.  He reports that he was not given short acting insulin which he has taken in the past at the time of his recent hospital discharge therefore new prescription provided for Humalog KwikPen and patient reports previously taking 10 units premeal.  He is also encouraged to take long-acting insulin as per hospital discharge.  He has been asked to return for follow-up with the clinical pharmacist in 1 to 2 weeks for recheck of blood sugar and patient has been asked to  bring his home glucometer.  Endocrinology referral is also being placed. - Comprehensive metabolic panel - Microalbumin/Creatinine Ratio, Urine - POCT URINALYSIS DIP (CLINITEK) - insulin aspart (novoLOG) injection 12 Units -  Ambulatory referral to Ophthalmology - insulin lispro (HUMALOG KWIKPEN) 100 UNIT/ML KwikPen; 10 units of short acting insulin premeal  Dispense: 15 mL; Refill: 11 - POCT glucose (manual entry) - POCT glucose (manual entry) - Ambulatory referral to endocrinology  2. Hyperlipidemia, unspecified hyperlipidemia type Refill provided of his pravastatin and he is encouraged to remain compliant with all medications as well as low-fat/low carbohydrate diet.  3. Essential Hypertension He is currently on amlodipine 10 mg daily as his Creatinine was elevated during his recent hospitalization.  Patient had been prescribed losartan in the past but apparently was not taking per hospital discharge notes.  6. Chronic kidney disease (CKD) stage G3a/A1, moderately decreased glomerular filtration rate (GFR) between 45-59 mL/min/1.73 square meter and albuminuria creatinine ratio less than 30 mg/g (HCC) Patient will have urine microalbumin/creatinine ratio and urinalysis and follow-up of his chronic kidney disease. - Microalbumin/Creatinine Ratio, Urine - POCT URINALYSIS DIP (CLINITEK)  7. Change in vision Patient reports changes in vision and has uncontrolled type 2 diabetes with hemoglobin A1c on 12/06/2019 of 11.3 during his recent hospitalization.  He has been referred to ophthalmology for further evaluation and treatment.  8. History of benign spinal cord tumor Patient reports history of benign spinal cord tumor for which he is status post removal while living in [place] approximately 6 months ago.  He does have some right upper and lower extremity weakness and right foot drop.  He has been referred to neurology for further evaluation and treatment.- Ambulatory referral to  Neurology  9. Non-healing wound of left lower extremity; 4. Diabetic polyneuropathy Patient is status post hospitalization from - CBC with Differential - AMB referral to wound care center - doxycycline (VIBRA-TABS) 100 MG tablet; Take 1 tablet (100 mg total) by mouth 2 (two) times daily.  Dispense: 20 tablet; Refill: 0  10. History of amputation of left great toe ([place]) 11. History of amputation of lesser toe of left foot ([place]) Patient with history of prior amputation of the left great and second toes possibly due to osteomyelitis.  Patient continues to have uncontrolled diabetes and abnormal foot shape as well as an abnormal growth over the left ankle.  He will be referred to podiatry for further evaluation and treatment - Ambulatory referral to Podiatry   Outpatient Encounter Medications as of 01/27/2020  Medication Sig  . Accu-Chek Softclix Lancets lancets Check blood sugar BID.  Marland Kitchen amLODipine (NORVASC) 10 MG tablet Take 1 tablet (10 mg total) by mouth daily.  . Blood Glucose Monitoring Suppl (TRUE METRIX METER) w/Device KIT 1 each by Does not apply route in the morning and at bedtime.  . gabapentin (NEURONTIN) 300 MG capsule Take 1 capsule (300 mg total) by mouth 3 (three) times daily as needed (pain).  Marland Kitchen glucose blood (TRUE METRIX BLOOD GLUCOSE TEST) test strip Use as instructed  . insulin detemir (LEVEMIR) 100 UNIT/ML FlexPen Inject 20 Units into the skin 2 (two) times daily.  . Insulin Pen Needle (PEN NEEDLES) 31G X 5 MM MISC 1 each by Does not apply route in the morning and at bedtime.  . pravastatin (PRAVACHOL) 20 MG tablet Take 1 tablet (20 mg total) by mouth daily.  Marland Kitchen acetaminophen (TYLENOL) 325 MG tablet Take 2 tablets (650 mg total) by mouth every 6 (six) hours as needed for mild pain or fever (for pain). (Patient not taking: Reported on 01/27/2020)   No facility-administered encounter medications on file as of 01/27/2020.    Follow-up: Return in about 4 weeks (around  02/24/2020) for DM; follow-up in 1-2 weeks with Luke/CPP and bring glucometer.   Antony Blackbird, MD

## 2020-01-27 NOTE — Progress Notes (Signed)
HAS NOT TAKEN BP MEDS

## 2020-01-28 LAB — COMPREHENSIVE METABOLIC PANEL WITH GFR
ALT: 16 IU/L (ref 0–44)
AST: 15 IU/L (ref 0–40)
Albumin/Globulin Ratio: 1.3 (ref 1.2–2.2)
Albumin: 3.8 g/dL — ABNORMAL LOW (ref 4.0–5.0)
Alkaline Phosphatase: 130 IU/L — ABNORMAL HIGH (ref 44–121)
BUN/Creatinine Ratio: 18 (ref 9–20)
BUN: 21 mg/dL — ABNORMAL HIGH (ref 6–20)
Bilirubin Total: 0.2 mg/dL (ref 0.0–1.2)
CO2: 23 mmol/L (ref 20–29)
Calcium: 9.2 mg/dL (ref 8.7–10.2)
Chloride: 97 mmol/L (ref 96–106)
Creatinine, Ser: 1.2 mg/dL (ref 0.76–1.27)
GFR calc Af Amer: 91 mL/min/1.73
GFR calc non Af Amer: 79 mL/min/1.73
Globulin, Total: 3 g/dL (ref 1.5–4.5)
Glucose: 391 mg/dL — ABNORMAL HIGH (ref 65–99)
Potassium: 4.4 mmol/L (ref 3.5–5.2)
Sodium: 136 mmol/L (ref 134–144)
Total Protein: 6.8 g/dL (ref 6.0–8.5)

## 2020-01-28 LAB — CBC WITH DIFFERENTIAL/PLATELET
Basophils Absolute: 0.1 x10E3/uL (ref 0.0–0.2)
Basos: 0 %
EOS (ABSOLUTE): 0.1 x10E3/uL (ref 0.0–0.4)
Eos: 1 %
Hematocrit: 36.6 % — ABNORMAL LOW (ref 37.5–51.0)
Hemoglobin: 11.7 g/dL — ABNORMAL LOW (ref 13.0–17.7)
Immature Grans (Abs): 0.1 x10E3/uL (ref 0.0–0.1)
Immature Granulocytes: 1 %
Lymphocytes Absolute: 1.7 x10E3/uL (ref 0.7–3.1)
Lymphs: 14 %
MCH: 28.1 pg (ref 26.6–33.0)
MCHC: 32 g/dL (ref 31.5–35.7)
MCV: 88 fL (ref 79–97)
Monocytes Absolute: 0.6 x10E3/uL (ref 0.1–0.9)
Monocytes: 5 %
Neutrophils Absolute: 9.6 x10E3/uL — ABNORMAL HIGH (ref 1.4–7.0)
Neutrophils: 79 %
Platelets: 284 x10E3/uL (ref 150–450)
RBC: 4.16 x10E6/uL (ref 4.14–5.80)
RDW: 13.7 % (ref 11.6–15.4)
WBC: 12.2 x10E3/uL — ABNORMAL HIGH (ref 3.4–10.8)

## 2020-01-28 LAB — MICROALBUMIN / CREATININE URINE RATIO
Creatinine, Urine: 139.9 mg/dL
Microalb/Creat Ratio: 3350 mg/g{creat} — ABNORMAL HIGH (ref 0–29)
Microalbumin, Urine: 4686.3 ug/mL

## 2020-01-29 MED FILL — HUMALOG 100 UNITS/ML KWIKPE: 100 | 50 days supply | Qty: 15 | Fill #0

## 2020-02-04 ENCOUNTER — Other Ambulatory Visit: Payer: Self-pay

## 2020-02-04 ENCOUNTER — Ambulatory Visit (INDEPENDENT_AMBULATORY_CARE_PROVIDER_SITE_OTHER): Payer: Medicaid Other

## 2020-02-04 ENCOUNTER — Encounter: Payer: Self-pay | Admitting: Podiatry

## 2020-02-04 ENCOUNTER — Ambulatory Visit (INDEPENDENT_AMBULATORY_CARE_PROVIDER_SITE_OTHER): Payer: Medicaid Other | Admitting: Podiatry

## 2020-02-04 DIAGNOSIS — E1142 Type 2 diabetes mellitus with diabetic polyneuropathy: Secondary | ICD-10-CM

## 2020-02-04 DIAGNOSIS — IMO0002 Reserved for concepts with insufficient information to code with codable children: Secondary | ICD-10-CM

## 2020-02-04 DIAGNOSIS — Z794 Long term (current) use of insulin: Secondary | ICD-10-CM

## 2020-02-04 DIAGNOSIS — M21371 Foot drop, right foot: Secondary | ICD-10-CM

## 2020-02-04 DIAGNOSIS — Z89412 Acquired absence of left great toe: Secondary | ICD-10-CM | POA: Diagnosis not present

## 2020-02-04 DIAGNOSIS — E1165 Type 2 diabetes mellitus with hyperglycemia: Secondary | ICD-10-CM

## 2020-02-04 DIAGNOSIS — M898X7 Other specified disorders of bone, ankle and foot: Secondary | ICD-10-CM

## 2020-02-04 DIAGNOSIS — L97521 Non-pressure chronic ulcer of other part of left foot limited to breakdown of skin: Secondary | ICD-10-CM | POA: Diagnosis not present

## 2020-02-04 DIAGNOSIS — S92902S Unspecified fracture of left foot, sequela: Secondary | ICD-10-CM

## 2020-02-04 DIAGNOSIS — G8101 Flaccid hemiplegia affecting right dominant side: Secondary | ICD-10-CM

## 2020-02-04 DIAGNOSIS — R269 Unspecified abnormalities of gait and mobility: Secondary | ICD-10-CM

## 2020-02-04 DIAGNOSIS — E118 Type 2 diabetes mellitus with unspecified complications: Secondary | ICD-10-CM

## 2020-02-04 MED ORDER — MUPIROCIN 2 % EX OINT: 1.0000 "application " | TOPICAL_OINTMENT | Freq: Two times a day (BID) | CUTANEOUS | 2 refills | Status: DC

## 2020-02-04 NOTE — Patient Instructions (Signed)
Contact [facility] for a drop foot brace:  CONTACT [place], [place] [address] Phone: [phone] Fax: [phone]  HOURS Monday - Friday, 8 a.m. - 5 p.m.

## 2020-02-04 NOTE — Progress Notes (Signed)
Subjective:  Patient ID: [name] [name], male    DOB: 1985/08/14,  MRN: [id number]  Chief Complaint  Patient presents with  . Diabetes     np-uncontrolled diabetes and neuropathy with history of osteomyelitis requiring amputation of left great and second toe-non req-dr. cammie fulp refer    34 y.o. male presents with the above complaint. History confirmed with patient. He recently moved here from [place]. 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 In 2019-21, developed ulcer and OM of hallux and then 2nd toe, underwent first ray resection and 2nd toe amputation. Ambulates in regular shoes. Has a large mass over anterior lateral left ankle with ulcer over that he puts neosporin on. Has had ulcer of submet 5 on right side. A1c 11.3%.  Objective:  Physical Exam: warm, good capillary refill and normal DP and PT pulses. Strength 4-/5 on right side, ulcer submet 5. 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 Ulcer over sinus tarsi with hard bony spur underneath. 1cm in diameter, hyperkeratosis around this with fibrogranular base. No signs of infection. No exposed bone. Onychomycosis x8  Radiographs: X-ray of the left foot: partial ray amputation of 1st, 2nd toe amp, HO formation around 2nd and 3rd metatarsals distally, large bony prominence over CCJ, degenerative changes in STJ, midtarsal joint. Appears to be anterior process fractured off Assessment:   1. Ulcer of left foot, limited to breakdown of skin ([place])   2. Exostosis of left foot   3. History of amputation of left great toe ([place])   4. Diabetic polyneuropathy associated with type 2 diabetes mellitus ([place])   5. Uncontrolled type 2 diabetes mellitus with complication, with long-term current use of insulin ([place])   6. Foot drop, right   7. Neurologic gait dysfunction   8. 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 Closed fracture of left foot, sequela      Plan:  Patient was evaluated and treated and all questions answered.  Patient educated on diabetes. Discussed proper diabetic foot care and discussed risks and complications of disease. Educated patient in depth on reasons to return to the office immediately should he/she discover anything concerning or new on the feet. All questions answered. Discussed proper shoes as well.   He is at high risk of further limb loss due to active ulceration, profound neuropathy and uncontrolled DM. He needs strict glycemic control in order to heal his wounds, he likely needs surgical intervention for the bony mass  The mass appears to be originating from or near the anterior process. I am ordered a CT scan for surgical planning for resection of the mass pending control of his A1c.  For his hemiplegia he has a profound drop foot. I recommend physical therapy and a drop foot brace, a Rx for this for [facility] was given to him today. PT referral sent to [facility].  Ulcer left lafter foot -Debridement as below. -Dressed with iodosorb, DSD -Rx mupirocin for home care  Procedure: Excisional Debridement of Wound Rationale: Removal of non-viable soft tissue from the wound to promote healing.  Anesthesia: none Pre-Debridement Wound Measurements: 1.0 cm x 1.0 cm x 0.1 cm  Post-Debridement Wound Measurements: same as pre-debridement  Type of Debridement: Sharp selective Tissue Removed: Non-viable soft tissue Depth of Debridement: subcutaneous tissue. Technique: Sharp selective  debridement to bleeding, viable wound base.  Dressing: Dry, sterile, compression dressing. Disposition: Patient tolerated procedure well. Patient to return in 1 week for follow-up.     A total of 65 minutes was spent today on the review of the patients medical record including previous laboratory values, imaging studies, taking  of the history, examining the patient, the ordering of procedures/labs/tests/medications, coordination of care, and documentation in the chart.    Return in about 3 weeks (around 02/25/2020) for wound re-check.

## 2020-02-08 ENCOUNTER — Ambulatory Visit: Payer: Medicaid Other | Admitting: Pharmacist

## 2020-02-08 ENCOUNTER — Telehealth: Payer: Self-pay | Admitting: General Practice

## 2020-02-08 NOTE — Telephone Encounter (Signed)
Called patient to reschedule today's afternoon appointment with [facility]. Provider requested reschedule.

## 2020-02-09 ENCOUNTER — Encounter: Payer: Self-pay | Admitting: Diagnostic Neuroimaging

## 2020-02-09 ENCOUNTER — Telehealth: Payer: Self-pay | Admitting: *Deleted

## 2020-02-09 ENCOUNTER — Ambulatory Visit: Payer: Medicaid Other | Admitting: Diagnostic Neuroimaging

## 2020-02-09 NOTE — Telephone Encounter (Signed)
Patient was no show for new patient appointment today. 

## 2020-02-10 ENCOUNTER — Encounter (HOSPITAL_BASED_OUTPATIENT_CLINIC_OR_DEPARTMENT_OTHER): Payer: Medicaid Other | Admitting: Physician Assistant

## 2020-02-17 ENCOUNTER — Telehealth: Payer: Self-pay | Admitting: General Practice

## 2020-02-17 NOTE — Telephone Encounter (Signed)
Alinda Sierras can you see who referred patient.    Copied from [place] [id number]. Topic: Appointment Scheduling - Scheduling Inquiry for Clinic >> Feb 17, 2020 10:58 AM Oneta Rack wrote: Caller name: Verdon Cummins  Relation to pt: [facility]: [place] Call back number: [phone] fax [phone]    Reason for call:  Requesting supporting notes and inquiring who the referring physician. Patient was referred for right AFO.

## 2020-02-19 NOTE — Telephone Encounter (Signed)
I look it up on the patient chart I didn't see nothing  . I contacted  [facility] . I spoke to [place] she told me that Dr Chapman Fitch refer him there I told her that Dr Chapman Fitch is not longer working with [place]  so they need a Rx for Ankle foot prosthetics for his new pcp since he is on Medicaid   Thank you .

## 2020-02-22 ENCOUNTER — Telehealth: Payer: Self-pay | Admitting: Licensed Clinical Social Worker

## 2020-02-22 NOTE — Telephone Encounter (Signed)
Follow up call placed to patient. LCSW left message stating a referral was completed to [facility] # [phone] address [address].   Pt was encouraged to contact clinic or LCSW with any questions or concerns.

## 2020-02-24 ENCOUNTER — Ambulatory Visit: Payer: Medicaid Other | Admitting: Family Medicine

## 2020-02-24 ENCOUNTER — Other Ambulatory Visit: Payer: Self-pay | Admitting: Podiatry

## 2020-02-24 ENCOUNTER — Ambulatory Visit: Payer: Medicaid Other | Admitting: Physician Assistant

## 2020-02-25 ENCOUNTER — Ambulatory Visit: Payer: Medicaid Other | Admitting: Podiatry

## 2020-02-26 ENCOUNTER — Other Ambulatory Visit: Payer: Self-pay

## 2020-02-26 ENCOUNTER — Ambulatory Visit
Admission: RE | Admit: 2020-02-26 | Discharge: 2020-02-26 | Disposition: A | Discharge status: Home / Self Care | Payer: Medicaid Other | Source: Ambulatory Visit | Attending: Podiatry | Admitting: Podiatry

## 2020-02-26 DIAGNOSIS — S92902S Unspecified fracture of left foot, sequela: Secondary | ICD-10-CM

## 2020-03-16 ENCOUNTER — Observation Stay (HOSPITAL_COMMUNITY)
Admission: EM | Admit: 2020-03-16 | Discharge: 2020-03-18 | Disposition: A | Discharge status: Home / Self Care | Payer: Medicaid Other | Attending: Internal Medicine | Admitting: Internal Medicine

## 2020-03-16 ENCOUNTER — Encounter (HOSPITAL_COMMUNITY): Payer: Self-pay

## 2020-03-16 DIAGNOSIS — IMO0002 Reserved for concepts with insufficient information to code with codable children: Secondary | ICD-10-CM

## 2020-03-16 DIAGNOSIS — L03119 Cellulitis of unspecified part of limb: Secondary | ICD-10-CM | POA: Diagnosis present

## 2020-03-16 DIAGNOSIS — B9689 Other specified bacterial agents as the cause of diseases classified elsewhere: Secondary | ICD-10-CM | POA: Insufficient documentation

## 2020-03-16 DIAGNOSIS — E1165 Type 2 diabetes mellitus with hyperglycemia: Secondary | ICD-10-CM | POA: Insufficient documentation

## 2020-03-16 DIAGNOSIS — Z89412 Acquired absence of left great toe: Secondary | ICD-10-CM | POA: Diagnosis not present

## 2020-03-16 DIAGNOSIS — J45909 Unspecified asthma, uncomplicated: Secondary | ICD-10-CM | POA: Insufficient documentation

## 2020-03-16 DIAGNOSIS — I10 Essential (primary) hypertension: Secondary | ICD-10-CM | POA: Insufficient documentation

## 2020-03-16 DIAGNOSIS — L0390 Cellulitis, unspecified: Secondary | ICD-10-CM | POA: Diagnosis present

## 2020-03-16 DIAGNOSIS — Z20822 Contact with and (suspected) exposure to covid-19: Secondary | ICD-10-CM | POA: Insufficient documentation

## 2020-03-16 DIAGNOSIS — L03115 Cellulitis of right lower limb: Secondary | ICD-10-CM | POA: Diagnosis not present

## 2020-03-16 DIAGNOSIS — E1159 Type 2 diabetes mellitus with other circulatory complications: Secondary | ICD-10-CM | POA: Diagnosis present

## 2020-03-16 DIAGNOSIS — Z79899 Other long term (current) drug therapy: Secondary | ICD-10-CM | POA: Insufficient documentation

## 2020-03-16 DIAGNOSIS — I152 Hypertension secondary to endocrine disorders: Secondary | ICD-10-CM | POA: Diagnosis present

## 2020-03-16 DIAGNOSIS — Z794 Long term (current) use of insulin: Secondary | ICD-10-CM | POA: Insufficient documentation

## 2020-03-16 DIAGNOSIS — L02611 Cutaneous abscess of right foot: Secondary | ICD-10-CM | POA: Diagnosis present

## 2020-03-16 LAB — CBC
HCT: 33.1 % — ABNORMAL LOW (ref 39.0–52.0)
Hemoglobin: 11 g/dL — ABNORMAL LOW (ref 13.0–17.0)
MCH: 28.7 pg (ref 26.0–34.0)
MCHC: 33.2 g/dL (ref 30.0–36.0)
MCV: 86.4 fL (ref 80.0–100.0)
Platelets: 247 10*3/uL (ref 150–400)
RBC: 3.83 MIL/uL — ABNORMAL LOW (ref 4.22–5.81)
RDW: 13.4 % (ref 11.5–15.5)
WBC: 12.8 10*3/uL — ABNORMAL HIGH (ref 4.0–10.5)
nRBC: 0 % (ref 0.0–0.2)

## 2020-03-16 LAB — BASIC METABOLIC PANEL
Anion gap: 11 (ref 5–15)
BUN: 16 mg/dL (ref 6–20)
CO2: 24 mmol/L (ref 22–32)
Calcium: 8.7 mg/dL — ABNORMAL LOW (ref 8.9–10.3)
Chloride: 96 mmol/L — ABNORMAL LOW (ref 98–111)
Creatinine, Ser: 1.27 mg/dL — ABNORMAL HIGH (ref 0.61–1.24)
GFR, Estimated: >60 mL/min (ref >60)
Glucose, Bld: 418 mg/dL — ABNORMAL HIGH (ref 70–99)
Potassium: 3.8 mmol/L (ref 3.5–5.1)
Sodium: 131 mmol/L — ABNORMAL LOW (ref 135–145)

## 2020-03-16 LAB — CBG MONITORING, ED: Glucose-Capillary: 401 mg/dL — ABNORMAL HIGH (ref 70–99)

## 2020-03-16 NOTE — ED Triage Notes (Signed)
Pt reports multiple boils to his right foot and right arm that has been there for a few days. Foot boil is currently draining. Pt is diabetic and noncompliant with medications.

## 2020-03-17 ENCOUNTER — Inpatient Hospital Stay (HOSPITAL_COMMUNITY): Payer: Medicaid Other

## 2020-03-17 ENCOUNTER — Other Ambulatory Visit: Payer: Self-pay

## 2020-03-17 ENCOUNTER — Emergency Department (HOSPITAL_COMMUNITY): Payer: Medicaid Other

## 2020-03-17 ENCOUNTER — Encounter (HOSPITAL_COMMUNITY): Payer: Self-pay | Admitting: Internal Medicine

## 2020-03-17 DIAGNOSIS — I10 Essential (primary) hypertension: Secondary | ICD-10-CM

## 2020-03-17 DIAGNOSIS — L03119 Cellulitis of unspecified part of limb: Secondary | ICD-10-CM | POA: Diagnosis present

## 2020-03-17 DIAGNOSIS — E1165 Type 2 diabetes mellitus with hyperglycemia: Secondary | ICD-10-CM | POA: Diagnosis not present

## 2020-03-17 DIAGNOSIS — E118 Type 2 diabetes mellitus with unspecified complications: Secondary | ICD-10-CM

## 2020-03-17 DIAGNOSIS — L03115 Cellulitis of right lower limb: Secondary | ICD-10-CM

## 2020-03-17 LAB — CBC
HCT: 31.3 % — ABNORMAL LOW (ref 39.0–52.0)
Hemoglobin: 10.4 g/dL — ABNORMAL LOW (ref 13.0–17.0)
MCH: 28.6 pg (ref 26.0–34.0)
MCHC: 33.2 g/dL (ref 30.0–36.0)
MCV: 86 fL (ref 80.0–100.0)
Platelets: 242 10*3/uL (ref 150–400)
RBC: 3.64 MIL/uL — ABNORMAL LOW (ref 4.22–5.81)
RDW: 13.3 % (ref 11.5–15.5)
WBC: 11.3 10*3/uL — ABNORMAL HIGH (ref 4.0–10.5)
nRBC: 0 % (ref 0.0–0.2)

## 2020-03-17 LAB — CBG MONITORING, ED
Glucose-Capillary: 292 mg/dL — ABNORMAL HIGH (ref 70–99)
Glucose-Capillary: 358 mg/dL — ABNORMAL HIGH (ref 70–99)
Glucose-Capillary: 331 mg/dL — ABNORMAL HIGH (ref 70–99)

## 2020-03-17 LAB — CREATININE, SERUM
Creatinine, Ser: 1.42 mg/dL — ABNORMAL HIGH (ref 0.61–1.24)
GFR, Estimated: >60 mL/min (ref >60)

## 2020-03-17 LAB — HEMOGLOBIN A1C
Hgb A1c MFr Bld: 10.9 % — ABNORMAL HIGH (ref 4.8–5.6)
Mean Plasma Glucose: 266.13 mg/dL

## 2020-03-17 LAB — LACTIC ACID, PLASMA: Lactic Acid, Venous: 1.8 mmol/L (ref 0.5–1.9)

## 2020-03-17 LAB — RESP PANEL BY RT-PCR (FLU A&B, COVID) ARPGX2
Influenza A by PCR: NEGATIVE
Influenza B by PCR: NEGATIVE
SARS Coronavirus 2 by RT PCR: NEGATIVE

## 2020-03-17 LAB — GLUCOSE, CAPILLARY
Glucose-Capillary: 198 mg/dL — ABNORMAL HIGH (ref 70–99)
Glucose-Capillary: 282 mg/dL — ABNORMAL HIGH (ref 70–99)

## 2020-03-17 MED ORDER — ENOXAPARIN SODIUM 40 MG/0.4ML ~~LOC~~ SOLN
40.0000 mg | Freq: Every day | SUBCUTANEOUS | Status: DC
  Administered 2020-03-17 – 2020-03-18 (×2): 40 mg via SUBCUTANEOUS
  Filled 2020-03-17 (×2): qty 0.4

## 2020-03-17 MED ORDER — PRAVASTATIN SODIUM 10 MG PO TABS
20.0000 mg | ORAL_TABLET | Freq: Every day | ORAL | Status: DC
  Administered 2020-03-17 – 2020-03-18 (×2): 20 mg via ORAL
  Filled 2020-03-17 (×2): qty 2

## 2020-03-17 MED ORDER — VANCOMYCIN HCL IN DEXTROSE 1-5 GM/200ML-% IV SOLN
1000.0000 mg | Freq: Two times a day (BID) | INTRAVENOUS | Status: DC
  Administered 2020-03-17 – 2020-03-18 (×2): 1000 mg via INTRAVENOUS
  Filled 2020-03-17 (×3): qty 200

## 2020-03-17 MED ORDER — SODIUM CHLORIDE 0.9 % IV BOLUS
1000.0000 mL | Freq: Once | INTRAVENOUS | Status: AC
  Administered 2020-03-17: 02:00:00 1000 mL via INTRAVENOUS

## 2020-03-17 MED ORDER — INSULIN DETEMIR 100 UNIT/ML ~~LOC~~ SOLN
20.0000 [IU] | Freq: Two times a day (BID) | SUBCUTANEOUS | Status: DC
  Administered 2020-03-17 – 2020-03-18 (×3): 20 [IU] via SUBCUTANEOUS
  Filled 2020-03-17 (×5): qty 0.2

## 2020-03-17 MED ORDER — AMLODIPINE BESYLATE 10 MG PO TABS
10.0000 mg | ORAL_TABLET | Freq: Every day | ORAL | Status: DC
  Administered 2020-03-17 – 2020-03-18 (×2): 10 mg via ORAL
  Filled 2020-03-17: qty 2
  Filled 2020-03-17: qty 1

## 2020-03-17 MED ORDER — GABAPENTIN 300 MG PO CAPS
300.0000 mg | ORAL_CAPSULE | Freq: Every day | ORAL | Status: DC
  Administered 2020-03-17 – 2020-03-18 (×2): 300 mg via ORAL
  Filled 2020-03-17 (×2): qty 1

## 2020-03-17 MED ORDER — SODIUM CHLORIDE 0.9 % IV SOLN
INTRAVENOUS | Status: DC | PRN
  Administered 2020-03-17: 17:00:00 250 mL via INTRAVENOUS

## 2020-03-17 MED ORDER — SODIUM CHLORIDE 0.9 % IV SOLN
2.0000 g | Freq: Three times a day (TID) | INTRAVENOUS | Status: DC
  Administered 2020-03-17 – 2020-03-18 (×4): 2 g via INTRAVENOUS
  Filled 2020-03-17 (×6): qty 2

## 2020-03-17 MED ORDER — CLINDAMYCIN PHOSPHATE 600 MG/50ML IV SOLN
600.0000 mg | Freq: Once | INTRAVENOUS | Status: AC
  Administered 2020-03-17: 04:00:00 600 mg via INTRAVENOUS
  Filled 2020-03-17: qty 50

## 2020-03-17 MED ORDER — SILVER SULFADIAZINE 1 % EX CREA
TOPICAL_CREAM | Freq: Two times a day (BID) | CUTANEOUS | Status: DC
  Filled 2020-03-17: qty 85

## 2020-03-17 MED ORDER — VANCOMYCIN HCL 2000 MG/400ML IV SOLN
2000.0000 mg | Freq: Once | INTRAVENOUS | Status: AC
  Administered 2020-03-17: 09:00:00 2000 mg via INTRAVENOUS
  Filled 2020-03-17: qty 400

## 2020-03-17 MED ORDER — ACETAMINOPHEN 325 MG PO TABS: 650.0000 mg | ORAL_TABLET | Freq: Four times a day (QID) | ORAL | Status: DC | PRN

## 2020-03-17 MED ORDER — ACETAMINOPHEN 650 MG RE SUPP: 650.0000 mg | Freq: Four times a day (QID) | RECTAL | Status: DC | PRN

## 2020-03-17 MED ORDER — INSULIN ASPART 100 UNIT/ML ~~LOC~~ SOLN
0.0000 [IU] | Freq: Three times a day (TID) | SUBCUTANEOUS | Status: DC
  Administered 2020-03-17: 15:00:00 7 [IU] via SUBCUTANEOUS
  Administered 2020-03-17: 09:00:00 9 [IU] via SUBCUTANEOUS
  Administered 2020-03-17: 17:00:00 2 [IU] via SUBCUTANEOUS
  Administered 2020-03-18: 7 [IU] via SUBCUTANEOUS
  Administered 2020-03-18: 3 [IU] via SUBCUTANEOUS

## 2020-03-17 NOTE — Progress Notes (Signed)
Pharmacy Antibiotic Note  [name] [name] is a 34 y.o. male admitted on 03/16/2020 with RLE cellulitis .  Pharmacy has been consulted for Vancomycin and Cefepime  dosing.  Plan: Vancomycin 2000 mg IV now, then 1 g IV q12h Cefepime 2 g IV q8h     Temp (24hrs), Avg:98.5 F (36.9 C), Min:98 F (36.7 C), Max:98.9 F (37.2 C)  Recent Labs  Lab 03/16/20 1356 03/17/20 0151  WBC 12.8*  --   CREATININE 1.27*  --   LATICACIDVEN  --  1.8    CrCl cannot be calculated (Unknown ideal weight.).    No Known Allergies    Caryl Pina 03/17/2020 5:26 AM

## 2020-03-17 NOTE — ED Notes (Signed)
Patient transported to MRI 

## 2020-03-17 NOTE — ED Provider Notes (Signed)
[facility] EMERGENCY DEPARTMENT Provider Note   CSN: [id number] Arrival date & time: 03/16/20  1252     History Chief Complaint  Patient presents with  . Abscess    [name] [name] is a 34 y.o. male.  The history is provided by the patient and medical records.  Abscess   34 y.o. M with hx of arthritis, asthma, bipolar disoder, depression, HTN, DM2, presenting to the ED for wound of right foot.  States he noticed a "boil" to the area a few days ago, earlier today he had increased pain and squeezed it with large amount of blood/pus expelled and now has some skin missing.  States he suffered burn of this foot previously and has a lot of nerve damage to the foot decreasing his sensitivity.  He denies fever/chills.  States he also has boil to right posterior shoulder, unsure how long that has been there.  He has been wearing sneakers, they are relatively new and does not think they are ill fitting.  He does have hx of MRSA in the past.  He has known DM and does not take his medications-- states he is overall depressed about his health and often just says "fuck it".   Patient states once he was placed in treatment room, he noticed redness along anterior shin.  States that was not present   Past Medical History:  Diagnosis Date  . Arthritis    "neck; maybe in my fingers" (11/06/2016)  . Asthma   . Bipolar disorder ([place])    "IED: intentional deficit disorder"  . Depression   . Heart murmur    "when I was a child"  . Hypertension   . Type II diabetes mellitus [facility])     Patient Active Problem List   Diagnosis Date Noted  . Uncontrolled type 2 diabetes mellitus with complication, with long-term current use of insulin ([place])   . Abscess   . Cellulitis 12/05/2019  . Cellulitis of left lower extremity   . Abscess of left lower extremity   . Uncontrolled type 2 diabetes mellitus with hyperglycemia ([place])   . 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 2 diabetes mellitus ([place])   . Cellulitis  of foot, left 11/22/2016  . Diabetic neuropathy ([place]) 11/22/2016  . Fracture of 2nd metatarsal 11/22/2016  . Fracture of 3rd metatarsal 11/22/2016  . Postprocedural non-healing wound 11/22/2016  . Acute cystitis with positive culture   . Cutaneous abscess of left foot   . Osteomyelitis ([place]) 11/03/2016  . Poorly controlled type 2 diabetes mellitus with complication ([place]) 46/27/0350  . Hyperlipidemia 10/17/2016  . Depression 10/17/2016  . Neck pain   . Asthma 04/11/2016  . Uncontrolled type 2 diabetes mellitus with complication ([place])   . Obesity, morbid ([place]) 09/06/2015  . Hypertension 09/06/2015    Past Surgical History:  Procedure Laterality Date  . AMPUTATION Left 11/05/2016   Procedure: AMPUTATION LEFT GREAT TOE;  Surgeon: Newt Minion, MD;  Location: [facility];  Service: Orthopedics;  Laterality: Left;  . DRESSING CHANGE UNDER ANESTHESIA N/A 04/13/2016   Procedure: DRESSING CHANGE UNDER ANESTHESIA;  Surgeon: Johnathan Hausen, MD;  [facility];  Service: General;  Laterality: N/A;  . INCISION AND DRAINAGE ABSCESS Right 09/03/2015   Procedure: INCISION AND DRAINAGE ABSCESS;  Surgeon: Roseanne Kaufman, MD;  [facility];  Service: Orthopedics;  Laterality: Right;  . INCISION AND DRAINAGE PERIRECTAL ABSCESS N/A 04/11/2016   Procedure: IRRIGATION AND DEBRIDEMENT RECTAL ABSCESS;  Surgeon: Johnathan Hausen, MD;  Location: [facility];  Service: General;  Laterality: N/A;  . [place]; 1989   x2   . IRRIGATION AND DEBRIDEMENT ABSCESS Left 08/31/2015   Procedure: IRRIGATION AND DEBRIDEMENT ABSCESS LEFT SHOULDER;  Surgeon: Alphonsa Overall, MD;  [facility];  Service: General;  Laterality: Left;  . WOUND DEBRIDEMENT N/A 02/24/2016   Procedure: SACRAL WOUND DEBRIDEMENT;  Surgeon: Georganna Skeans, MD;  [facility];  Service: General;  Laterality: N/A;       Family History  Problem Relation Age of Onset  . Diabetes type II Mother   . Diabetes type II Father     Social  History   Tobacco Use  . Smoking status: Never Smoker  . Smokeless tobacco: Never Used  Vaping Use  . Vaping Use: Never used  Substance Use Topics  . Alcohol use: Yes    Comment: 11/06/2016 "I'll drink a little at parties; maybe 5 times/year"  . Drug use: Yes    Types: Marijuana    Comment: 11/06/2016 "stopped ~ 1 month ago"    Home Medications Prior to Admission medications   Medication Sig Start Date End Date Taking? Authorizing Provider  Accu-Chek Softclix Lancets lancets Check blood sugar BID. 12/30/19   Charlott Rakes, MD  acetaminophen (TYLENOL) 325 MG tablet Take 2 tablets (650 mg total) by mouth every 6 (six) hours as needed for mild pain or fever (for pain). Patient not taking: Reported on 01/27/2020 04/17/16   Regalado, Jerald Kief A, MD  amLODipine (NORVASC) 10 MG tablet Take 1 tablet (10 mg total) by mouth daily. 12/30/19   Argentina Donovan, PA-C  Blood Glucose Monitoring Suppl (TRUE METRIX METER) w/Device KIT 1 each by Does not apply route in the morning and at bedtime. 12/30/19   Charlott Rakes, MD  doxycycline (VIBRA-TABS) 100 MG tablet Take 1 tablet (100 mg total) by mouth 2 (two) times daily. 01/27/20   Fulp, Cammie, MD  gabapentin (NEURONTIN) 300 MG capsule Take 1 capsule (300 mg total) by mouth 3 (three) times daily as needed (pain). 12/30/19   Argentina Donovan, PA-C  glucose blood (TRUE METRIX BLOOD GLUCOSE TEST) test strip Use as instructed 12/30/19   Argentina Donovan, PA-C  insulin detemir (LEVEMIR) 100 UNIT/ML FlexPen Inject 20 Units into the skin 2 (two) times daily. 12/30/19   Argentina Donovan, PA-C  insulin lispro (HUMALOG KWIKPEN) 100 UNIT/ML KwikPen 10 units of short acting insulin premeal 01/27/20   Fulp, Cammie, MD  Insulin Pen Needle (PEN NEEDLES) 31G X 5 MM MISC 1 each by Does not apply route in the morning and at bedtime. 12/30/19   Charlott Rakes, MD  mupirocin ointment (BACTROBAN) 2 % Apply 1 application topically 2 (two) times daily. 02/04/20    McDonald, Stephan Minister, DPM  pravastatin (PRAVACHOL) 20 MG tablet Take 1 tablet (20 mg total) by mouth daily. 12/30/19   Argentina Donovan, PA-C    Allergies    Patient has no known allergies.  Review of Systems   Review of Systems  Skin: Positive for wound.  All other systems reviewed and are negative.   Physical Exam Updated Vital Signs BP 137/81 (BP Location: Left Arm)   Pulse 84   Temp 98 F (36.7 C) (Oral)   Resp 18   SpO2 96%   Physical Exam Vitals and nursing note reviewed.  Constitutional:      Appearance: He is well-developed and well-nourished.  HENT:     Head: Normocephalic and atraumatic.  Mouth/Throat:     Mouth: Oropharynx is clear and moist.  Eyes:     Extraocular Movements: EOM normal.     Conjunctiva/sclera: Conjunctivae normal.     Pupils: Pupils are equal, round, and reactive to light.  Cardiovascular:     Rate and Rhythm: Normal rate and regular rhythm.     Heart sounds: Normal heart sounds.  Pulmonary:     Effort: Pulmonary effort is normal. No respiratory distress.     Breath sounds: Normal breath sounds. No rhonchi.  Abdominal:     General: Bowel sounds are normal.     Palpations: Abdomen is soft.     Tenderness: There is no abdominal tenderness. There is no rebound.  Musculoskeletal:        General: Normal range of motion.     Cervical back: Normal range of motion.     Comments: Wound noted to right lateral foot, top layer of skin has been avulsed away, small amount of purulent drainage noted, cellulitis noted to dorsal foot and extending up to about mid anterior shin, leg warm to touch without noted tissue crepitus  Small boil noted to right posterior shoulder, no infection  Skin:    General: Skin is warm and dry.  Neurological:     Mental Status: He is alert and oriented to person, place, and time.  Psychiatric:        Mood and Affect: Mood and affect normal.       ED Results / Procedures / Treatments   Labs (all labs ordered are  listed, but only abnormal results are displayed) Labs Reviewed  BASIC METABOLIC PANEL - Abnormal; Notable for the following components:      Result Value   Sodium 131 (*)    Chloride 96 (*)    Glucose, Bld 418 (*)    Creatinine, Ser 1.27 (*)    Calcium 8.7 (*)    All other components within normal limits  CBC - Abnormal; Notable for the following components:   WBC 12.8 (*)    RBC 3.83 (*)    Hemoglobin 11.0 (*)    HCT 33.1 (*)    All other components within normal limits  CBG MONITORING, ED - Abnormal; Notable for the following components:   Glucose-Capillary 401 (*)    All other components within normal limits  CBG MONITORING, ED - Abnormal; Notable for the following components:   Glucose-Capillary 292 (*)    All other components within normal limits  RESP PANEL BY RT-PCR (FLU A&B, COVID) ARPGX2  CULTURE, BLOOD (ROUTINE X 2)  CULTURE, BLOOD (ROUTINE X 2)  LACTIC ACID, PLASMA  HEMOGLOBIN A1C  CBC  CREATININE, SERUM  CBG MONITORING, ED    EKG None  Radiology DG Foot Complete Right  Result Date: 03/17/2020 CLINICAL DATA:  Diabetic wound, cellulitis EXAM: RIGHT FOOT COMPLETE - 3+ VIEW COMPARISON:  None. FINDINGS: There is no evidence of fracture or dislocation. There is no evidence of arthropathy or other focal bone abnormality. Diffuse dorsal soft tissue swelling is seen. IMPRESSION: No acute osseous abnormality.  Diffuse dorsal soft tissue swelling. Electronically Signed   By: Prudencio Pair M.D.   On: 03/17/2020 02:38    Procedures Procedures (including critical care time)  Medications Ordered in ED Medications  amLODipine (NORVASC) tablet 10 mg (has no administration in time range)  pravastatin (PRAVACHOL) tablet 20 mg (has no administration in time range)  insulin detemir (LEVEMIR) injection 20 Units (has no administration in time range)  gabapentin (NEURONTIN)  capsule 300 mg (has no administration in time range)  vancomycin (VANCOREADY) IVPB 2000 mg/400 mL (has no  administration in time range)  ceFEPIme (MAXIPIME) 2 g in sodium chloride 0.9 % 100 mL IVPB (has no administration in time range)  insulin aspart (novoLOG) injection 0-9 Units (has no administration in time range)  enoxaparin (LOVENOX) injection 40 mg (has no administration in time range)  acetaminophen (TYLENOL) tablet 650 mg (has no administration in time range)    Or  acetaminophen (TYLENOL) suppository 650 mg (has no administration in time range)  vancomycin (VANCOCIN) IVPB 1000 mg/200 mL premix (has no administration in time range)  sodium chloride 0.9 % bolus 1,000 mL (0 mLs Intravenous Stopped 03/17/20 0349)  clindamycin (CLEOCIN) IVPB 600 mg (0 mg Intravenous Stopped 03/17/20 0536)    ED Course  I have reviewed the triage vital signs and the nursing notes.  Pertinent labs & imaging results that were available during my care of the patient were reviewed by me and considered in my medical decision making (see chart for details).    MDM Rules/Calculators/A&P  34 y.o. M here with diabetic wound to right foot.  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  Denies fever/chills.  Hx of DM, uncontrolled as he does not take his home DM meds.  He is afebrile, non-toxic, NAD.  Does have wound noted to lateral right foot, top layer of skin has been avulsed away, small amount of purulent drainage.  Does have some cellulitic changes to dorsal right foot and up to anterior shin.  CBG now 292, hyperglycemia on labs but no findings of DKA.  WBC 12.8.  Will add on lactate, blood cultures, plain films of the foot.  Will start IVF.    3:25 AM Patient reassessed-- remains non-toxic but does have extension of the cellulitis since first evaluation, now seems to me wrapping around to medial calf as well.  Still no fever here.  X-ray without bony destruction or osteomyelitis, does have soft tissue swelling.  Will start IV abx and admit for ongoing management, likely would benefit from wound care and diabetes  counseling.  Discussed with Dr. Hal Hope-- will admit for ongoing care.  Final Clinical Impression(s) / ED Diagnoses Final diagnoses:  Cellulitis of right lower extremity    Rx / DC Orders ED Discharge Orders    None       Larene Pickett, PA-C 03/17/20 0600    Long, Wonda Olds, MD 03/22/20 1209

## 2020-03-17 NOTE — H&P (Signed)
History and Physical    Donley Harland OPF:[id number] DOB: 02-21-1986 DOA: 03/16/2020  PCP: Patient, No Pcp Per  Patient coming from: Home.  Chief Complaint: Right foot boil.  HPI: [name] [name] is a 34 y.o. 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  This was ongoing for last 2 days denies any fever chills or trauma.  While waiting in the ER patient also noticed that he is foot was become erythematous and there he was moving proximally to his leg.  ED Course: In the ER patient was afebrile x-rays did not show anything acute.  On exam patient has erythema of the right foot and leg.  There is an ulceration on the lateral aspect of the right foot measuring around 1/2 cm appears dark in color.  Labs are significant for blood glucose of 418 which improved to 292 IV fluids for sodium 131.  Creatinine 1.2 WBC 12.8 hemoglobin 11.  Covid test was negative.  Review of Systems: As per HPI, rest all negative.   Past Medical History:  Diagnosis Date  . Arthritis    "neck; maybe in my fingers" (11/06/2016)  . Asthma   . Bipolar disorder ([place])    "IED: intentional deficit disorder"  . Depression   . Heart murmur    "when I was a child"  . Hypertension   . Type II diabetes mellitus ([place])     Past Surgical History:  Procedure Laterality Date  . AMPUTATION Left 11/05/2016   Procedure: AMPUTATION LEFT GREAT TOE;  Surgeon: Newt Minion, MD;  Location: [facility];  Service: Orthopedics;  Laterality: Left;  . DRESSING CHANGE UNDER ANESTHESIA N/A 04/13/2016   Procedure: DRESSING CHANGE UNDER ANESTHESIA;  Surgeon: Johnathan Hausen, MD;  [facility];  Service: General;  Laterality: N/A;  . INCISION AND DRAINAGE ABSCESS Right  09/03/2015   Procedure: INCISION AND DRAINAGE ABSCESS;  Surgeon: Roseanne Kaufman, MD;  [facility];  Service: Orthopedics;  Laterality: Right;  . INCISION AND DRAINAGE PERIRECTAL ABSCESS N/A 04/11/2016   Procedure: IRRIGATION AND DEBRIDEMENT RECTAL ABSCESS;  Surgeon: Johnathan Hausen, MD;  [facility];  Service: General;  Laterality: N/A;  . [place]; 1989   x2   . IRRIGATION AND DEBRIDEMENT ABSCESS Left 08/31/2015   Procedure: IRRIGATION AND DEBRIDEMENT ABSCESS LEFT SHOULDER;  Surgeon: Alphonsa Overall, MD;  [facility];  Service: General;  Laterality: Left;  . WOUND DEBRIDEMENT N/A 02/24/2016   Procedure: SACRAL WOUND DEBRIDEMENT;  Surgeon: Georganna Skeans, MD;  Location: [facility];  Service: General;  Laterality: N/A;     reports that he has never smoked. He has never used smokeless tobacco. He reports current alcohol use. He reports current drug use. Drug: Marijuana.  No Known Allergies  Family History  Problem Relation Age of Onset  . Diabetes type II Mother   . Diabetes type II Father     Prior to Admission medications   Medication Sig Start Date End Date Taking? Authorizing Provider  Accu-Chek Softclix Lancets lancets Check blood sugar BID. 12/30/19   Charlott Rakes, MD  acetaminophen (TYLENOL) 325 MG tablet Take 2 tablets (650 mg total) by mouth every 6 (six) hours as needed for mild pain  or fever (for pain). Patient not taking: Reported on 01/27/2020 04/17/16   Regalado, [place]erald Kief A, MD  amLODipine (NORVASC) 10 MG tablet Take 1 tablet (10 mg total) by mouth daily. 12/30/19   Argentina Donovan, PA-C  Blood Glucose Monitoring Suppl (TRUE METRIX METER) w/Device KIT 1 each by Does not apply route in the morning and at bedtime. 12/30/19   Charlott Rakes, MD  doxycycline (VIBRA-TABS) 100 MG tablet Take 1 tablet (100 mg total) by mouth 2 (two) times daily. 01/27/20   Fulp, Cammie, MD  gabapentin (NEURONTIN) 300 MG capsule Take 1 capsule (300 mg total) by mouth 3  (three) times daily as needed (pain). 12/30/19   Argentina Donovan, PA-C  glucose blood (TRUE METRIX BLOOD GLUCOSE TEST) test strip Use as instructed 12/30/19   Argentina Donovan, PA-C  insulin detemir (LEVEMIR) 100 UNIT/ML FlexPen Inject 20 Units into the skin 2 (two) times daily. 12/30/19   Argentina Donovan, PA-C  insulin lispro (HUMALOG KWIKPEN) 100 UNIT/ML KwikPen 10 units of short acting insulin premeal 01/27/20   Fulp, Cammie, MD  Insulin Pen Needle (PEN NEEDLES) 31G X 5 MM MISC 1 each by Does not apply route in the morning and at bedtime. 12/30/19   Charlott Rakes, MD  mupirocin ointment (BACTROBAN) 2 % Apply 1 application topically 2 (two) times daily. 02/04/20   McDonald, Stephan Minister, DPM  pravastatin (PRAVACHOL) 20 MG tablet Take 1 tablet (20 mg total) by mouth daily. 12/30/19   Argentina Donovan, PA-C    Physical Exam: Constitutional: Moderately built and nourished. Vitals:   03/17/20 0330 03/17/20 0400 03/17/20 0445 03/17/20 0515  BP: (!) 168/115 (!) 189/108 (!) 155/93 (!) 169/98  Pulse: 88 92 89 88  Resp:   18 12  Temp:      TempSrc:      SpO2: 100% 100% 100% 98%   Eyes: Anicteric no pallor. ENMT: No discharge from the ears eyes nose or mouth. Neck: No mass felt.  No neck rigidity. Respiratory: No rhonchi or crepitations. Cardiovascular: S1-S2 heard. Abdomen: Soft nontender bowel sound present. Musculoskeletal: Erythema of the right foot and leg and ulceration on the lateral aspect of the right foot. Skin: Ulceration of the lateral aspect of the right foot. Neurologic: Alert awake oriented to time place and person.  Moves all extremities. Psychiatric: Appears normal.  Normal affect.   Labs on Admission: I have personally reviewed following labs and imaging studies  CBC: Recent Labs  Lab 03/16/20 1356  WBC 12.8*  HGB 11.0*  HCT 33.1*  MCV 86.4  PLT 062   Basic Metabolic Panel: Recent Labs  Lab 03/16/20 1356  NA 131*  K 3.8  CL 96*  CO2 24  GLUCOSE 418*   BUN 16  CREATININE 1.27*  CALCIUM 8.7*   GFR: CrCl cannot be calculated (Unknown ideal weight.). Liver Function Tests: No results for input(s): AST, ALT, ALKPHOS, BILITOT, PROT, ALBUMIN in the last 168 hours. No results for input(s): LIPASE, AMYLASE in the last 168 hours. No results for input(s): AMMONIA in the last 168 hours. Coagulation Profile: No results for input(s): INR, PROTIME in the last 168 hours. Cardiac Enzymes: No results for input(s): CKTOTAL, CKMB, CKMBINDEX, TROPONINI in the last 168 hours. BNP (last 3 results) No results for input(s): PROBNP in the last 8760 hours. HbA1C: No results for input(s): HGBA1C in the last 72 hours. CBG: Recent Labs  Lab 03/16/20 1350 03/17/20 0149  GLUCAP 401* 292*   Lipid Profile: No results for  input(s): CHOL, HDL, LDLCALC, TRIG, CHOLHDL, LDLDIRECT in the last 72 hours. Thyroid Function Tests: No results for input(s): TSH, T4TOTAL, FREET4, T3FREE, THYROIDAB in the last 72 hours. Anemia Panel: No results for input(s): VITAMINB12, FOLATE, FERRITIN, TIBC, IRON, RETICCTPCT in the last 72 hours. Urine analysis:    Component Value Date/Time   COLORURINE STRAW (A) 12/05/2019 1420   APPEARANCEUR CLEAR 12/05/2019 1420   LABSPEC 1.029 12/05/2019 1420   PHURINE 6.0 12/05/2019 1420   GLUCOSEU >=500 (A) 12/05/2019 1420   HGBUR SMALL (A) 12/05/2019 1420   BILIRUBINUR negative 01/27/2020 1233   BILIRUBINUR negative 10/29/2016 1413   KETONESUR negative 01/27/2020 1233   KETONESUR NEGATIVE 12/05/2019 1420   PROTEINUR 100 (A) 12/05/2019 1420   UROBILINOGEN 0.2 01/27/2020 1233   NITRITE Negative 01/27/2020 1233   NITRITE NEGATIVE 12/05/2019 1420   LEUKOCYTESUR Negative 01/27/2020 1233   LEUKOCYTESUR NEGATIVE 12/05/2019 1420   Sepsis Labs: '@LABRCNTIP' (procalcitonin:4,lacticidven:4) )No results found for this or any previous visit (from the past 240 hour(s)).   Radiological Exams on Admission: DG Foot Complete Right  Result Date:  03/17/2020 CLINICAL DATA:  Diabetic wound, cellulitis EXAM: RIGHT FOOT COMPLETE - 3+ VIEW COMPARISON:  None. FINDINGS: There is no evidence of fracture or dislocation. There is no evidence of arthropathy or other focal bone abnormality. Diffuse dorsal soft tissue swelling is seen. IMPRESSION: No acute osseous abnormality.  Diffuse dorsal soft tissue swelling. Electronically Signed   By: Prudencio Pair M.D.   On: 03/17/2020 02:38      Assessment/Plan Principal Problem:   Cellulitis, leg Active Problems:   Hypertension   Uncontrolled type 2 diabetes mellitus with complication (HCC)   Cellulitis of right leg    1. Cellulitis of the right leg with ulceration for which at this time I have ordered MRI and also will continue with antibiotics.  Based on MRI we will have further plans. 2. Diabetes mellitus type 2 uncontrolled has been noncompliant with medications.  Check hemoglobin A1c presently will restart his home dose of Levemir sliding scale coverage. 3. Hypertension start patient back on his amlodipine.  Follow blood pressure trends. 4. History of hyperlipidemia on statins has been started again. 5. Chronic anemia follow CBC. 6. 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  Since patient has uncontrolled diabetes with worsening cellulitis of the right foot with ulceration will need close monitoring for any further worsening in inpatient status.   DVT prophylaxis: Lovenox. Code Status: Full code. Family Communication: Discussed with patient. Disposition Plan: Home. Consults called: Wound team. Admission status: Inpatient.   Rise Patience MD [facility] Pager [phone].  If 7PM-7AM, please contact night-coverage [email or web address] Password [email or web address]  03/17/2020, 5:22 AM

## 2020-03-17 NOTE — ED Notes (Signed)
Pt back from MRI 

## 2020-03-17 NOTE — Consult Note (Signed)
WOC Nurse Consult Note:  Patient receiving care in [facility]. Consult completed after review of record, images, and imaging results. Reason for Consult: foot wound Wound type: diabetic ulcer to right lateral foot; cellulitis present Pressure Injury POA: Yes/No/NA Measurement: To be provided by the bedside RN in the flowsheet section Wound bed: red Drainage (amount, consistency, odor) per patient purulent when a "boil burst" Periwound: skin peeling Dressing procedure/placement/frequency: BID 1% Silvadene apply to lateral wound of right foot. Cover with gauze, secure with kerlex.  Monitor the wound area(s) for worsening of condition such as: Signs/symptoms of infection,  Increase in size,  Development of or worsening of odor, Development of pain, or increased pain at the affected locations.  Notify the medical team if any of these develop.  Thank you for the consult. [facility] nurse will not follow at this time.  Please re-consult the [place] team if needed.  Val Riles, RN, MSN, CWOCN, CNS-BC, pager [phone]

## 2020-03-17 NOTE — ED Notes (Signed)
MS Breakfast order placed

## 2020-03-17 NOTE — Progress Notes (Signed)
Patient seen and examined.  He is a still in the emergency room waiting for inpatient bed assignment.  In brief, 34 year old gentleman with insulin-dependent diabetes who is not taking any insulin and noncompliant to treatment with multiple diabetic foot ulcers presents with new diabetic foot ulcer right lateral foot and spreading cellulitis.  Currently hemodynamically stable.  Plan: -Continue vancomycin and cefepime for next 24 hours and if clinical improvement will change to oral antibiotics. -MRI with no evidence of collectible abscess to drain, no evidence of osteomyelitis. -He follows up with [facility], will send referral to follow-up after discharge.  Anticipate discharge tomorrow on oral antibiotics if more clinical improvement. -Extensively counseled and discussed about resuming his insulin.  A1c is more than 10.  No indication to alter the regimen, he just needs to take it.

## 2020-03-18 ENCOUNTER — Other Ambulatory Visit (HOSPITAL_COMMUNITY): Payer: Self-pay | Admitting: Internal Medicine

## 2020-03-18 DIAGNOSIS — E118 Type 2 diabetes mellitus with unspecified complications: Secondary | ICD-10-CM | POA: Diagnosis not present

## 2020-03-18 DIAGNOSIS — I10 Essential (primary) hypertension: Secondary | ICD-10-CM | POA: Diagnosis not present

## 2020-03-18 DIAGNOSIS — L03115 Cellulitis of right lower limb: Secondary | ICD-10-CM | POA: Diagnosis not present

## 2020-03-18 DIAGNOSIS — Z794 Long term (current) use of insulin: Secondary | ICD-10-CM

## 2020-03-18 DIAGNOSIS — E1165 Type 2 diabetes mellitus with hyperglycemia: Secondary | ICD-10-CM | POA: Diagnosis not present

## 2020-03-18 LAB — BASIC METABOLIC PANEL
Anion gap: 9 (ref 5–15)
BUN: 16 mg/dL (ref 6–20)
CO2: 23 mmol/L (ref 22–32)
Calcium: 9 mg/dL (ref 8.9–10.3)
Chloride: 104 mmol/L (ref 98–111)
Creatinine, Ser: 1.27 mg/dL — ABNORMAL HIGH (ref 0.61–1.24)
GFR, Estimated: >60 mL/min (ref >60)
Glucose, Bld: 210 mg/dL — ABNORMAL HIGH (ref 70–99)
Potassium: 3.7 mmol/L (ref 3.5–5.1)
Sodium: 136 mmol/L (ref 135–145)

## 2020-03-18 LAB — GLUCOSE, CAPILLARY
Glucose-Capillary: 213 mg/dL — ABNORMAL HIGH (ref 70–99)
Glucose-Capillary: 332 mg/dL — ABNORMAL HIGH (ref 70–99)

## 2020-03-18 LAB — CBC WITH DIFFERENTIAL/PLATELET
Abs Immature Granulocytes: 0.07 10*3/uL (ref 0.00–0.07)
Basophils Absolute: 0.1 10*3/uL (ref 0.0–0.1)
Basophils Relative: 1 %
Eosinophils Absolute: 0.3 10*3/uL (ref 0.0–0.5)
Eosinophils Relative: 3 %
HCT: 30.4 % — ABNORMAL LOW (ref 39.0–52.0)
Hemoglobin: 10.4 g/dL — ABNORMAL LOW (ref 13.0–17.0)
Immature Granulocytes: 1 %
Lymphocytes Relative: 19 %
Lymphs Abs: 2 10*3/uL (ref 0.7–4.0)
MCH: 28.5 pg (ref 26.0–34.0)
MCHC: 34.2 g/dL (ref 30.0–36.0)
MCV: 83.3 fL (ref 80.0–100.0)
Monocytes Absolute: 0.9 10*3/uL (ref 0.1–1.0)
Monocytes Relative: 8 %
Neutro Abs: 7.1 10*3/uL (ref 1.7–7.7)
Neutrophils Relative %: 68 %
Platelets: 236 10*3/uL (ref 150–400)
RBC: 3.65 MIL/uL — ABNORMAL LOW (ref 4.22–5.81)
RDW: 13 % (ref 11.5–15.5)
WBC: 10.5 10*3/uL (ref 4.0–10.5)
nRBC: 0 % (ref 0.0–0.2)

## 2020-03-18 LAB — MAGNESIUM: Magnesium: 2 mg/dL (ref 1.7–2.4)

## 2020-03-18 LAB — PHOSPHORUS: Phosphorus: 4.1 mg/dL (ref 2.5–4.6)

## 2020-03-18 MED ORDER — SILVER SULFADIAZINE 1 % EX CREA: TOPICAL_CREAM | Freq: Two times a day (BID) | CUTANEOUS | 0 refills | Status: DC

## 2020-03-18 MED ORDER — CEFDINIR 300 MG PO CAPS: 600.0000 mg | ORAL_CAPSULE | Freq: Every day | ORAL | 0 refills | Status: DC

## 2020-03-18 MED ORDER — SULFAMETHOXAZOLE-TRIMETHOPRIM 800-160 MG PO TABS
1.0000 | ORAL_TABLET | Freq: Two times a day (BID) | ORAL | 0 refills | Status: DC
Start: 2020-03-18 — End: 2020-07-27

## 2020-03-18 MED FILL — SULFAMETHOXAZOLE-TMP DS TAB: 800-160 | 7 days supply | Qty: 14 | Fill #0

## 2020-03-18 MED FILL — CEFDINIR 300 MG CAPSULE: 300 | 7 days supply | Qty: 14 | Fill #0

## 2020-03-18 NOTE — Progress Notes (Signed)
Inpatient Diabetes Program Recommendations  AACE/ADA: New Consensus Statement on Inpatient Glycemic Control (2015)  Target Ranges:  Prepandial:   less than 140 mg/dL      Peak postprandial:   less than 180 mg/dL (1-2 hours)      Critically ill patients:  140 - 180 mg/dL   Lab Results  Component Value Date   GLUCAP 213 (H) 03/18/2020   HGBA1C 10.9 (H) 03/17/2020    Diabetes history:  DM2 Outpatient Diabetes medications:  Levemir 20 units BID Humalog 10 once a day with a meal Current orders for Inpatient glycemic control:  Levemir 20 units BID  Novolog 0-9 units TID  Note:  Spoke with patient on the phone this morning.  He is discharging today and waiting on his ride.  Patient admits to not taking insulins or checking CBG's.  He states "I suck at taking my medicines".  He also states his depression inhibits him from taking his medication as well.  He does have a functioning glucometer and plenty of insulin at home.  He lives in a boarding house.  Encouraged him to take his insulins as prescribed and check his blood sugar at least 1 x a day.  Discussed short and long term complications of uncontrolled blood glucose.  Reviewed patient's current A1c of 10.9%. Explained what a A1c is and what it measures. Also reviewed goal A1c with patient, importance of good glucose control @ home, and blood sugar goals.  He follows up with [facility].  He states he needs to make a follow up appointment.  Encouraged him to do so today. Denies hypoglycemia.  Aware of signs, symptoms and treatment.  Will continue to follow while inpatient.  Thank you, Reche Dixon, RN, BSN Diabetes Coordinator Inpatient Diabetes Program [phone] (team pager from 8a-5p)

## 2020-03-18 NOTE — Progress Notes (Signed)
[name] [name] to be D/C'd Home per MD order.  Discussed with the patient and all questions fully answered.   VSS, Skin clean, dry and intact without evidence of skin break down, no evidence of skin tears noted. IV catheter discontinued intact. Site without signs and symptoms of complications. Dressing and pressure applied.   An After Visit Summary was printed and given to the patient.    D/C education completed with patient/family including follow up instructions, medication list, d/c activities limitations if indicated, with other d/c instructions as indicated by MD - patient able to verbalize understanding, all questions fully answered.    Patient instructed to return to ED, call 911, or call MD for any changes in condition.    Patient escorted via [place], and D/C home via private car.

## 2020-03-18 NOTE — Progress Notes (Signed)
Pt IV removed, catheter intact. Pt has all belongings. Pt understands d/c instructions. Pt d/c via wheelchair by RN.

## 2020-03-18 NOTE — Discharge Summary (Signed)
Physician Discharge Summary  [name] [name] WIO:[id number] DOB: August 28, 1985 DOA: 03/16/2020  PCP: Patient, No Pcp Per  Admit date: 03/16/2020 Discharge date: 03/18/2020  Admitted From: home Discharge disposition: home   Recommendations for Outpatient Follow-Up:   Needs to take his insulin  Discharge Diagnosis:   Principal Problem:   Cellulitis, leg Active Problems:   Hypertension   Uncontrolled type 2 diabetes mellitus with complication ([place])   Cellulitis of right leg    Discharge Condition: Improved.  Diet recommendation: Low sodium, heart healthy.  Carbohydrate-modified.   Code status: Full.   History of Present Illness:   [name] [name] is a 34 y.o. 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  This was ongoing for last 2 days denies any fever chills or trauma.  While waiting in the ER patient also noticed that he is foot was become erythematous and there he was moving proximally to his leg.  ED Course: In the ER patient was afebrile x-rays did not show anything acute.  On exam patient has erythema of the right foot and leg.  There is an ulceration on the lateral aspect of the right foot measuring around 1/2 cm appears dark in color.  Labs are significant for blood glucose of 418 which improved to 292 IV fluids for sodium 131.  Creatinine 1.2 WBC 12.8 hemoglobin 11.  Covid test was negative.   Hospital Course by Problem:   Cellulitis of the right leg with ulceration -much improved, changed to oral abx -MRI with no evidence of collectible abscess to drain, no evidence of osteomyelitis. -He follows up with [facility]  Diabetes mellitus type 2 uncontrolled has been noncompliant  with medications.   -encouraged compliance  Hypertension  -resume home meds  History of hyperlipidemia - on statin-- doubt patient has been taking       Medical Consultants:      Discharge Exam:   Vitals:   03/18/20 0000 03/18/20 0544  BP: (!) 172/96 (!) 138/102  Pulse: 87 84  Resp: 18   Temp: 98.2 F (36.8 C) (!) 97.4 F (36.3 C)  SpO2: 100% 100%   Vitals:   03/17/20 1533 03/17/20 1549 03/18/20 0000 03/18/20 0544  BP: (!) 132/110  (!) 172/96 (!) 138/102  Pulse: 81  87 84  Resp: 18  18   Temp: 98 F (36.7 C)  98.2 F (36.8 C) (!) 97.4 F (36.3 C)  TempSrc: Oral  Oral Oral  SpO2: 100%  100% 100%  Weight:  104.5 kg    Height:  5' 6" (1.676 m)      General exam: Appears calm and comfortable.  The results of significant diagnostics from this hospitalization (including imaging, microbiology, ancillary and laboratory) are listed below for reference.     Procedures and Diagnostic Studies:   MR FOOT RIGHT WO CONTRAST  Result Date: 03/17/2020 CLINICAL DATA:  Diabetic foot ulcer on the lateral aspect of the foot. EXAM: MRI OF THE RIGHT FOREFOOT WITHOUT CONTRAST TECHNIQUE: Multiplanar, multisequence MR imaging of the right was performed. No intravenous contrast was administered. COMPARISON:  Radiographs 03/17/2020 FINDINGS: There is an open wound noted along the lateral aspect of the forefoot. This is along  the plantar and lateral aspect of the fifth metatarsal head. No drainable soft tissue abscess is identified. Subcutaneous soft tissue swelling/edema/fluid noted mainly along the dorsum of the foot consistent with cellulitis. Mild edema like signal changes in the forefoot musculature suggesting myositis. No findings for pyomyositis. No MR findings suspicious for septic arthritis or osteomyelitis. Poor distal fat saturation over the fifth toe is noted but this is technical. IMPRESSION: 1. Open wound along the lateral aspect of the forefoot along the plantar and lateral  aspect of the fifth metatarsal head. 2. Cellulitis and myositis but no findings for drainable soft tissue abscess or pyomyositis. 3. No MR findings for septic arthritis or osteomyelitis. Electronically Signed   By: Marijo Sanes M.D.   On: 03/17/2020 07:57   DG Foot Complete Right  Result Date: 03/17/2020 CLINICAL DATA:  Diabetic wound, cellulitis EXAM: RIGHT FOOT COMPLETE - 3+ VIEW COMPARISON:  None. FINDINGS: There is no evidence of fracture or dislocation. There is no evidence of arthropathy or other focal bone abnormality. Diffuse dorsal soft tissue swelling is seen. IMPRESSION: No acute osseous abnormality.  Diffuse dorsal soft tissue swelling. Electronically Signed   By: Prudencio Pair M.D.   On: 03/17/2020 02:38     Labs:   Basic Metabolic Panel: Recent Labs  Lab 03/16/20 1356 03/17/20 0725 03/18/20 0606  NA 131*  --  136  K 3.8  --  3.7  CL 96*  --  104  CO2 24  --  23  GLUCOSE 418*  --  210*  BUN 16  --  16  CREATININE 1.27* 1.42* 1.27*  CALCIUM 8.7*  --  9.0  MG  --   --  2.0  PHOS  --   --  4.1   GFR Estimated Creatinine Clearance: 93.7 mL/min (A) (by C-G formula based on SCr of 1.27 mg/dL (H)). Liver Function Tests: No results for input(s): AST, ALT, ALKPHOS, BILITOT, PROT, ALBUMIN in the last 168 hours. No results for input(s): LIPASE, AMYLASE in the last 168 hours. No results for input(s): AMMONIA in the last 168 hours. Coagulation profile No results for input(s): INR, PROTIME in the last 168 hours.  CBC: Recent Labs  Lab 03/16/20 1356 03/17/20 0725 03/18/20 0606  WBC 12.8* 11.3* 10.5  NEUTROABS  --   --  7.1  HGB 11.0* 10.4* 10.4*  HCT 33.1* 31.3* 30.4*  MCV 86.4 86.0 83.3  PLT 247 242 236   Cardiac Enzymes: No results for input(s): CKTOTAL, CKMB, CKMBINDEX, TROPONINI in the last 168 hours. BNP: Invalid input(s): POCBNP CBG: Recent Labs  Lab 03/17/20 0845 03/17/20 1416 03/17/20 1619 03/17/20 2033 03/18/20 0536  GLUCAP 358* 331* 198* 282* 213*    D-Dimer No results for input(s): DDIMER in the last 72 hours. Hgb A1c Recent Labs    03/17/20 0726  HGBA1C 10.9*   Lipid Profile No results for input(s): CHOL, HDL, LDLCALC, TRIG, CHOLHDL, LDLDIRECT in the last 72 hours. Thyroid function studies No results for input(s): TSH, T4TOTAL, T3FREE, THYROIDAB in the last 72 hours.  Invalid input(s): FREET3 Anemia work up No results for input(s): VITAMINB12, FOLATE, FERRITIN, TIBC, IRON, RETICCTPCT in the last 72 hours. Microbiology Recent Results (from the past 240 hour(s))  Blood culture (routine x 2)     Status: None (Preliminary result)   Collection Time: 03/17/20  2:10 AM   Specimen: BLOOD  Result Value Ref Range Status   Specimen Description BLOOD LEFT ANTECUBITAL  Final   Special Requests   Final  BOTTLES DRAWN AEROBIC AND ANAEROBIC Blood Culture results may not be optimal due to an inadequate volume of blood received in culture bottles   Culture   Final    NO GROWTH 1 DAY Performed at [facility] [address] [place], [place] [address]    Report Status PENDING  Incomplete  Blood culture (routine x 2)     Status: None (Preliminary result)   Collection Time: 03/17/20  2:15 AM   Specimen: BLOOD LEFT WRIST  Result Value Ref Range Status   Specimen Description BLOOD LEFT WRIST  Final   Special Requests   Final    BOTTLES DRAWN AEROBIC AND ANAEROBIC Blood Culture adequate volume   Culture   Final    NO GROWTH 1 DAY Performed at [facility], [place] [address], [place], [place] [address]    Report Status PENDING  Incomplete  Resp Panel by RT-PCR (Flu A&B, Covid) Nasopharyngeal Swab     Status: None   Collection Time: 03/17/20  3:51 AM   Specimen: Nasopharyngeal Swab; Nasopharyngeal(NP) swabs in vial transport medium  Result Value Ref Range Status   SARS Coronavirus 2 by RT PCR NEGATIVE NEGATIVE Final    Comment: (NOTE) SARS-CoV-2 target nucleic acids are NOT DETECTED.  The SARS-CoV-2 RNA is generally  detectable in upper respiratory specimens during the acute phase of infection. The lowest concentration of SARS-CoV-2 viral copies this assay can detect is 138 copies/mL. A negative result does not preclude SARS-Cov-2 infection and should not be used as the sole basis for treatment or other patient management decisions. A negative result may occur with  improper specimen collection/handling, submission of specimen other than nasopharyngeal swab, presence of viral mutation(s) within the areas targeted by this assay, and inadequate number of viral copies(<138 copies/mL). A negative result must be combined with clinical observations, patient history, and epidemiological information. The expected result is Negative.  Fact Sheet for Patients:  EntrepreneurPulse.com.au  Fact Sheet for Healthcare Providers:  IncredibleEmployment.be  This test is no t yet approved or cleared by the [place] FDA and  has been authorized for detection and/or diagnosis of SARS-CoV-2 by FDA under an Emergency Use Authorization (EUA). This EUA will remain  in effect (meaning this test can be used) for the duration of the COVID-19 declaration under Section 564(b)(1) of the Act, 21 U.S.C.section 360bbb-3(b)(1), unless the authorization is terminated  or revoked sooner.       Influenza A by PCR NEGATIVE NEGATIVE Final   Influenza B by PCR NEGATIVE NEGATIVE Final    Comment: (NOTE) The Xpert Xpress SARS-CoV-2/FLU/RSV plus assay is intended as an aid in the diagnosis of influenza from Nasopharyngeal swab specimens and should not be used as a sole basis for treatment. Nasal washings and aspirates are unacceptable for Xpert Xpress SARS-CoV-2/FLU/RSV testing.  Fact Sheet for Patients: EntrepreneurPulse.com.au  Fact Sheet for Healthcare Providers: IncredibleEmployment.be  This test is not yet approved or cleared by the [place] FDA  and has been authorized for detection and/or diagnosis of SARS-CoV-2 by FDA under an Emergency Use Authorization (EUA). This EUA will remain in effect (meaning this test can be used) for the duration of the COVID-19 declaration under Section 564(b)(1) of the Act, 21 U.S.C. section 360bbb-3(b)(1), unless the authorization is terminated or revoked.  Performed at [facility], [place] [address], [place], [place] [address]      Discharge Instructions:   Discharge Instructions    Diet Carb Modified   Complete by:  As directed    Discharge wound care:   Complete by: As directed    BID 1% Silvadene apply to lateral wound of right foot. Cover with gauze, secure with kerlex.   Increase activity slowly   Complete by: As directed      Allergies as of 03/18/2020   No Known Allergies     Medication List    STOP taking these medications   doxycycline 100 MG tablet Commonly known as: VIBRA-TABS   mupirocin ointment 2 % Commonly known as: BACTROBAN     TAKE these medications   Accu-Chek Softclix Lancets lancets Check blood sugar BID.   acetaminophen 325 MG tablet Commonly known as: TYLENOL Take 2 tablets (650 mg total) by mouth every 6 (six) hours as needed for mild pain or fever (for pain).   amLODipine 10 MG tablet Commonly known as: NORVASC Take 1 tablet (10 mg total) by mouth daily.   cefdinir 300 MG capsule Commonly known as: OMNICEF Take 2 capsules (600 mg total) by mouth daily.   gabapentin 300 MG capsule Commonly known as: NEURONTIN Take 1 capsule (300 mg total) by mouth 3 (three) times daily as needed (pain).   insulin detemir 100 UNIT/ML FlexPen Commonly known as: LEVEMIR Inject 20 Units into the skin 2 (two) times daily.   insulin lispro 100 UNIT/ML KwikPen Commonly known as: HumaLOG KwikPen 10 units of short acting insulin premeal   Pen Needles 31G X 5 MM Misc 1 each by Does not apply route in the morning and at bedtime.   pravastatin 20 MG  tablet Commonly known as: PRAVACHOL Take 1 tablet (20 mg total) by mouth daily.   silver sulfADIAZINE 1 % cream Commonly known as: SILVADENE Apply topically 2 (two) times daily.   sulfamethoxazole-trimethoprim 800-160 MG tablet Commonly known as: BACTRIM DS Take 1 tablet by mouth 2 (two) times daily.   True Metrix Blood Glucose Test test strip Generic drug: glucose blood Use as instructed   True Metrix Meter w/Device Kit 1 each by Does not apply route in the morning and at bedtime.            Discharge Care Instructions  (From admission, onward)         Start     Ordered   03/18/20 0000  Discharge wound care:       Comments: BID 1% Silvadene apply to lateral wound of right foot. Cover with gauze, secure with kerlex.   03/18/20 5885            Time coordinating discharge: 35 min  Signed:  Geradine Girt DO  [facility] 03/18/2020, 9:07 AM

## 2020-03-22 ENCOUNTER — Encounter (HOSPITAL_BASED_OUTPATIENT_CLINIC_OR_DEPARTMENT_OTHER): Payer: Medicaid Other | Attending: Internal Medicine | Admitting: Internal Medicine

## 2020-03-22 LAB — CULTURE, BLOOD (ROUTINE X 2)
Culture: NO GROWTH
Culture: NO GROWTH
Special Requests: ADEQUATE

## 2020-03-24 ENCOUNTER — Ambulatory Visit (HOSPITAL_COMMUNITY)
Admission: EM | Admit: 2020-03-24 | Discharge: 2020-03-24 | Disposition: A | Discharge status: Home / Self Care | Payer: Medicaid Other | Attending: Family Medicine | Admitting: Family Medicine

## 2020-03-24 ENCOUNTER — Other Ambulatory Visit: Payer: Self-pay

## 2020-03-24 ENCOUNTER — Ambulatory Visit (INDEPENDENT_AMBULATORY_CARE_PROVIDER_SITE_OTHER): Payer: Medicaid Other

## 2020-03-24 ENCOUNTER — Encounter (HOSPITAL_COMMUNITY): Payer: Self-pay

## 2020-03-24 DIAGNOSIS — R079 Chest pain, unspecified: Secondary | ICD-10-CM

## 2020-03-24 DIAGNOSIS — R0602 Shortness of breath: Secondary | ICD-10-CM | POA: Diagnosis not present

## 2020-03-24 NOTE — ED Triage Notes (Signed)
Pt presents with chest and back pain X 2 hours. Pt states he has felt fatigued and SOB. He states he has felt sharp pain on his chest and near his heart. Pt states he feels like he is going to have a heart attack. Pt states he is loosing sensation on the right side of his body.

## 2020-03-28 NOTE — ED Provider Notes (Signed)
[name]    CSN: [id number] Arrival date & time: 03/24/20  1703      History   Chief Complaint Chief Complaint  Patient presents with  . Chest Pain  . Shortness of Breath  . Back Pain    HPI [name] [name] is a 35 y.o. male.   Here today with right sided sharp chest pain and SOB that started several hours ago that has eased off a bit since being here in clinic. The pain is worse with movement but does not fully resolve with rest. Denies known injury, dizziness, syncope, palpitations. Has not tried anything for sxs at this time. 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      Past Medical History:  Diagnosis Date  . Arthritis    "neck; maybe in my fingers" (11/06/2016)  . Asthma   . Bipolar disorder ([place])    "IED: intentional deficit disorder"  . Depression   . Heart murmur    "when I was a child"  . Hypertension   . Type II diabetes mellitus [facility])     Patient Active Problem List   Diagnosis Date Noted  . Cellulitis, leg 03/17/2020  . Cellulitis of right leg 03/17/2020  . Uncontrolled type 2 diabetes mellitus with complication, with long-term current use of insulin ([place])   . Abscess   . Cellulitis 12/05/2019  . Cellulitis of left lower extremity   . Abscess of left lower extremity   . Uncontrolled type 2 diabetes mellitus with hyperglycemia ([place])   . 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 2 diabetes mellitus ([place])   . Cellulitis of foot, left 11/22/2016  . Diabetic neuropathy ([place]) 11/22/2016  . Fracture of 2nd metatarsal 11/22/2016  . Fracture of 3rd metatarsal 11/22/2016  . Postprocedural non-healing wound 11/22/2016  . Acute cystitis with positive culture   . Cutaneous abscess of left foot   . Osteomyelitis ([place]) 11/03/2016  . Poorly controlled type 2 diabetes mellitus with complication ([place]) 02/54/2706  . Hyperlipidemia 10/17/2016  . Depression  10/17/2016  . Neck pain   . Asthma 04/11/2016  . Uncontrolled type 2 diabetes mellitus with complication ([place])   . Obesity, morbid ([place]) 09/06/2015  . Hypertension 09/06/2015    Past Surgical History:  Procedure Laterality Date  . AMPUTATION Left 11/05/2016   Procedure: AMPUTATION LEFT GREAT TOE;  Surgeon: Newt Minion, MD;  Location: [place];  Service: Orthopedics;  Laterality: Left;  . DRESSING CHANGE UNDER ANESTHESIA N/A 04/13/2016   Procedure: DRESSING CHANGE UNDER ANESTHESIA;  Surgeon: Johnathan Hausen, MD;  [facility];  Service: General;  Laterality: N/A;  . INCISION AND DRAINAGE ABSCESS Right 09/03/2015   Procedure: INCISION AND DRAINAGE ABSCESS;  Surgeon: Roseanne Kaufman, MD;  [facility];  Service: Orthopedics;  Laterality: Right;  . INCISION AND DRAINAGE PERIRECTAL ABSCESS N/A 04/11/2016   Procedure: IRRIGATION AND DEBRIDEMENT RECTAL ABSCESS;  Surgeon: Johnathan Hausen, MD;  [facility];  Service: General;  Laterality: N/A;  . [place]; 1989   x2   . IRRIGATION AND DEBRIDEMENT ABSCESS Left 08/31/2015   Procedure: IRRIGATION AND DEBRIDEMENT ABSCESS LEFT SHOULDER;  Surgeon: Alphonsa Overall, MD;  [facility];  Service: General;  Laterality: Left;  . WOUND DEBRIDEMENT N/A 02/24/2016   Procedure: SACRAL WOUND DEBRIDEMENT;  Surgeon: Georganna Skeans, MD;  Location: [place];  Service: General;  Laterality: N/A;       Home  Medications    Prior to Admission medications   Medication Sig Start Date End Date Taking? Authorizing Provider  cefdinir (OMNICEF) 300 MG capsule Take 2 capsules (600 mg total) by mouth daily. 03/18/20  Yes Geradine Girt, DO  Accu-Chek Softclix Lancets lancets Check blood sugar BID. 12/30/19   Charlott Rakes, MD  acetaminophen (TYLENOL) 325 MG tablet Take 2 tablets (650 mg total) by mouth every 6 (six) hours as needed for mild pain or fever (for pain). Patient not taking: No sig reported 04/17/16   Regalado, Belkys A, MD  amLODipine  (NORVASC) 10 MG tablet Take 1 tablet (10 mg total) by mouth daily. Patient not taking: Reported on 03/17/2020 12/30/19   Argentina Donovan, PA-C  Blood Glucose Monitoring Suppl (TRUE METRIX METER) w/Device KIT 1 each by Does not apply route in the morning and at bedtime. 12/30/19   Charlott Rakes, MD  gabapentin (NEURONTIN) 300 MG capsule Take 1 capsule (300 mg total) by mouth 3 (three) times daily as needed (pain). Patient not taking: Reported on 03/17/2020 12/30/19   Argentina Donovan, PA-C  glucose blood (TRUE METRIX BLOOD GLUCOSE TEST) test strip Use as instructed 12/30/19   Argentina Donovan, PA-C  insulin detemir (LEVEMIR) 100 UNIT/ML FlexPen Inject 20 Units into the skin 2 (two) times daily. Patient not taking: Reported on 03/17/2020 12/30/19   Argentina Donovan, PA-C  insulin lispro (HUMALOG KWIKPEN) 100 UNIT/ML KwikPen 10 units of short acting insulin premeal Patient not taking: Reported on 03/17/2020 01/27/20   Fulp, Cammie, MD  Insulin Pen Needle (PEN NEEDLES) 31G X 5 MM MISC 1 each by Does not apply route in the morning and at bedtime. 12/30/19   Charlott Rakes, MD  pravastatin (PRAVACHOL) 20 MG tablet Take 1 tablet (20 mg total) by mouth daily. Patient not taking: Reported on 03/17/2020 12/30/19   Argentina Donovan, PA-C  silver sulfADIAZINE (SILVADENE) 1 % cream Apply topically 2 (two) times daily. 03/18/20   Geradine Girt, DO  sulfamethoxazole-trimethoprim (BACTRIM DS) 800-160 MG tablet Take 1 tablet by mouth 2 (two) times daily. 03/18/20   Geradine Girt, DO    Family History Family History  Problem Relation Age of Onset  . Diabetes type II Mother   . Diabetes type II Father     Social History Social History   Tobacco Use  . Smoking status: Never Smoker  . Smokeless tobacco: Never Used  Vaping Use  . Vaping Use: Never used  Substance Use Topics  . Alcohol use: Yes    Comment: 11/06/2016 "I'll drink a little at parties; maybe 5 times/year"  . Drug use: Yes     Types: Marijuana    Comment: 11/06/2016 "stopped ~ 1 month ago"     Allergies   Patient has no known allergies.   Review of Systems Review of Systems PER HPI    Physical Exam Triage Vital Signs ED Triage Vitals  Enc Vitals Group     BP 03/24/20 1723 (!) 147/87     Pulse Rate 03/24/20 1723 (!) 102     Resp 03/24/20 1723 20     Temp 03/24/20 1723 99 F (37.2 C)     Temp Source 03/24/20 1723 Oral     SpO2 03/24/20 1723 100 %     Weight --      Height --      Head Circumference --      Peak Flow --      Pain Score 03/24/20 1721  3     Pain Loc --      Pain Edu? --      Excl. in [name]? --    No data found.  Updated Vital Signs BP (!) 147/87 (BP Location: Left Arm)   Pulse (!) 102   Temp 99 F (37.2 C) (Oral)   Resp 20   SpO2 100%   Visual Acuity Right Eye Distance:   Left Eye Distance:   Bilateral Distance:    Right Eye Near:   Left Eye Near:    Bilateral Near:     Physical Exam Vitals and nursing note reviewed.  Constitutional:      Appearance: Normal appearance.  HENT:     Head: Atraumatic.  Eyes:     Extraocular Movements: Extraocular movements intact.     Conjunctiva/sclera: Conjunctivae normal.  Cardiovascular:     Rate and Rhythm: Normal rate and regular rhythm.     Heart sounds: Normal heart sounds.  Pulmonary:     Effort: Pulmonary effort is normal.     Breath sounds: Normal breath sounds. No wheezing or rales.  Abdominal:     General: Bowel sounds are normal. There is no distension.     Palpations: Abdomen is soft.     Tenderness: There is no abdominal tenderness. There is no guarding.  Musculoskeletal:        General: Tenderness (some reproduction of right sided pectoral pain) present. No swelling.     Cervical back: Normal range of motion and neck supple.     Comments: ROM at baseline per patient, in wheelchair  Skin:    General: Skin is warm and dry.  Neurological:     Mental Status: He is oriented to person, place, and time. Mental  status is at baseline.  Psychiatric:        Mood and Affect: Mood normal.        Thought Content: Thought content normal.        Judgment: Judgment normal.      UC Treatments / Results  Labs (all labs ordered are listed, but only abnormal results are displayed) Labs Reviewed - No data to display  EKG   Radiology No results found.  Procedures Procedures (including critical care time)  Medications Ordered in UC Medications - No data to display  Initial Impression / Assessment and Plan / UC Course  I have reviewed the triage vital signs and the nursing notes.  Pertinent labs & imaging results that were available during my care of the patient were reviewed by me and considered in my medical decision making (see chart for details).  Clinical Course as of 03/28/20 0707  Thu Mar 24, 2020  1746 ED EKG [BH]    Clinical Course User Index [BH] Vanessa Kick, MD    Vital signs overall reassuring today, mild tachycardia and mild hypertension but otherwise WNL. EKG showing sinus tachycardia but no ST or T wave changes. CXR showing some possibe right lobe atelectasis vs early opacity but given clinical picture suspect atelectasis. Discussed at length with patient that without further imaging and cardiac enzymes we are unable to r/o more emergent causes of his CP. He declines going to ED at this time, wishes to go home and rest, take OTC pain relievers. Knows to go to ED if sxs worsening at any time.   Final Clinical Impressions(s) / UC Diagnoses   Final diagnoses:  None   Discharge Instructions   None    ED Prescriptions  None     PDMP not reviewed this encounter.   Merrie Roof [place], [place] 03/28/20 [phone]

## 2020-04-25 ENCOUNTER — Encounter (HOSPITAL_BASED_OUTPATIENT_CLINIC_OR_DEPARTMENT_OTHER): Payer: Medicaid Other | Admitting: Internal Medicine

## 2020-07-14 ENCOUNTER — Telehealth: Payer: Self-pay

## 2020-07-14 NOTE — Telephone Encounter (Signed)
Copied from [place] [phone]. Topic: Referral - Request for Referral >> Jul 13, 2020  4:33 PM Tessa Lerner A wrote: Has patient seen PCP for this complaint? No  *If NO, is insurance requiring patient see PCP for this issue before PCP can refer them?  Referral for which specialty: [facility]  Preferred provider/office: Patient has no preference  Reason for referral: Patient is in need of assistance with wound care and PT  Patient has an appointment scheduled for May 11 th with Freeman Caldron this will be his first time in our office since seeing Cammie Fulp in November of 2021.  Please advice

## 2020-07-15 NOTE — Telephone Encounter (Signed)
Pt can discuss matter with Levada Dy on office date.

## 2020-07-17 HISTORY — PX: CATARACT EXTRACTION: SUR2

## 2020-07-18 ENCOUNTER — Other Ambulatory Visit (HOSPITAL_BASED_OUTPATIENT_CLINIC_OR_DEPARTMENT_OTHER): Payer: Self-pay

## 2020-07-18 MED ORDER — OFLOXACIN 0.3 % OP SOLN
OPHTHALMIC | 5 refills | Status: DC
  Filled 2020-07-18: qty 5, 25d supply, fill #0
  Filled 2020-08-04: qty 5, 18d supply, fill #0
  Filled 2020-08-26: qty 5, 18d supply, fill #1

## 2020-07-18 MED ORDER — PREDNISOLONE ACETATE 1 % OP SUSP
OPHTHALMIC | 5 refills | Status: DC
  Filled 2020-07-18: qty 5, 25d supply, fill #0
  Filled 2020-08-04: qty 5, 18d supply, fill #0
  Filled 2020-08-26: qty 5, 18d supply, fill #1

## 2020-07-19 ENCOUNTER — Emergency Department (HOSPITAL_COMMUNITY)
Admission: EM | Admit: 2020-07-19 | Discharge: 2020-07-19 | Disposition: A | Discharge status: Home / Self Care | Payer: Medicaid Other | Attending: Emergency Medicine | Admitting: Emergency Medicine

## 2020-07-19 ENCOUNTER — Other Ambulatory Visit: Payer: Self-pay

## 2020-07-19 DIAGNOSIS — J45909 Unspecified asthma, uncomplicated: Secondary | ICD-10-CM | POA: Diagnosis not present

## 2020-07-19 DIAGNOSIS — H547 Unspecified visual loss: Secondary | ICD-10-CM | POA: Diagnosis present

## 2020-07-19 DIAGNOSIS — I10 Essential (primary) hypertension: Secondary | ICD-10-CM | POA: Diagnosis not present

## 2020-07-19 DIAGNOSIS — R739 Hyperglycemia, unspecified: Secondary | ICD-10-CM

## 2020-07-19 DIAGNOSIS — R1110 Vomiting, unspecified: Secondary | ICD-10-CM | POA: Insufficient documentation

## 2020-07-19 DIAGNOSIS — Z79899 Other long term (current) drug therapy: Secondary | ICD-10-CM | POA: Insufficient documentation

## 2020-07-19 DIAGNOSIS — E1165 Type 2 diabetes mellitus with hyperglycemia: Secondary | ICD-10-CM | POA: Insufficient documentation

## 2020-07-19 DIAGNOSIS — Z794 Long term (current) use of insulin: Secondary | ICD-10-CM | POA: Insufficient documentation

## 2020-07-19 LAB — CBC WITH DIFFERENTIAL/PLATELET
Abs Immature Granulocytes: 0.06 10*3/uL (ref 0.00–0.07)
Basophils Absolute: 0.1 10*3/uL (ref 0.0–0.1)
Basophils Relative: 1 %
Eosinophils Absolute: 0.2 10*3/uL (ref 0.0–0.5)
Eosinophils Relative: 2 %
HCT: 33.9 % — ABNORMAL LOW (ref 39.0–52.0)
Hemoglobin: 11.9 g/dL — ABNORMAL LOW (ref 13.0–17.0)
Immature Granulocytes: 1 %
Lymphocytes Relative: 19 %
Lymphs Abs: 1.9 10*3/uL (ref 0.7–4.0)
MCH: 28.8 pg (ref 26.0–34.0)
MCHC: 35.1 g/dL (ref 30.0–36.0)
MCV: 82.1 fL (ref 80.0–100.0)
Monocytes Absolute: 0.5 10*3/uL (ref 0.1–1.0)
Monocytes Relative: 5 %
Neutro Abs: 7.5 10*3/uL (ref 1.7–7.7)
Neutrophils Relative %: 72 %
Platelets: 246 10*3/uL (ref 150–400)
RBC: 4.13 MIL/uL — ABNORMAL LOW (ref 4.22–5.81)
RDW: 12.9 % (ref 11.5–15.5)
WBC: 10.2 10*3/uL (ref 4.0–10.5)
nRBC: 0 % (ref 0.0–0.2)

## 2020-07-19 LAB — CBG MONITORING, ED
Glucose-Capillary: 342 mg/dL — ABNORMAL HIGH (ref 70–99)
Glucose-Capillary: 284 mg/dL — ABNORMAL HIGH (ref 70–99)

## 2020-07-19 LAB — COMPREHENSIVE METABOLIC PANEL
ALT: 12 U/L (ref 0–44)
AST: 11 U/L — ABNORMAL LOW (ref 15–41)
Albumin: 2.9 g/dL — ABNORMAL LOW (ref 3.5–5.0)
Alkaline Phosphatase: 80 U/L (ref 38–126)
Anion gap: 7 (ref 5–15)
BUN: 25 mg/dL — ABNORMAL HIGH (ref 6–20)
CO2: 25 mmol/L (ref 22–32)
Calcium: 8.5 mg/dL — ABNORMAL LOW (ref 8.9–10.3)
Chloride: 104 mmol/L (ref 98–111)
Creatinine, Ser: 1.45 mg/dL — ABNORMAL HIGH (ref 0.61–1.24)
GFR, Estimated: >60 mL/min (ref >60)
Glucose, Bld: 323 mg/dL — ABNORMAL HIGH (ref 70–99)
Potassium: 4 mmol/L (ref 3.5–5.1)
Sodium: 136 mmol/L (ref 135–145)
Total Bilirubin: 0.3 mg/dL (ref 0.3–1.2)
Total Protein: 6.5 g/dL (ref 6.5–8.1)

## 2020-07-19 LAB — LIPASE, BLOOD: Lipase: 33 U/L (ref 11–51)

## 2020-07-19 MED ORDER — LABETALOL HCL 5 MG/ML IV SOLN
10.0000 mg | Freq: Once | INTRAVENOUS | Status: AC
  Administered 2020-07-19: 10 mg via INTRAVENOUS
  Filled 2020-07-19: qty 4

## 2020-07-19 MED ORDER — AMLODIPINE BESYLATE 5 MG PO TABS
10.0000 mg | ORAL_TABLET | Freq: Every day | ORAL | Status: DC
  Administered 2020-07-19: 10 mg via ORAL
  Filled 2020-07-19: qty 2

## 2020-07-19 MED ORDER — INSULIN DETEMIR 100 UNIT/ML ~~LOC~~ SOLN
20.0000 [IU] | Freq: Two times a day (BID) | SUBCUTANEOUS | Status: DC
  Administered 2020-07-19: 20 [IU] via SUBCUTANEOUS
  Filled 2020-07-19 (×2): qty 0.2

## 2020-07-19 MED ORDER — SODIUM CHLORIDE 0.9 % IV SOLN: INTRAVENOUS | Status: DC

## 2020-07-19 NOTE — ED Provider Notes (Signed)
[facility] DEPT Provider Note   CSN: [id number] Arrival date & time: 07/19/20  1323     History No chief complaint on file.   [name] [name] is a 35 y.o. male.  35 year old male with history of diabetes and hypertension as well as blindness presents with trouble with his blood pressure as well as hyperglycemia.  States that due to his decreased vision, he has been able to check his blood sugars and has not been taking his insulin or high blood pressure medication for several months.  States of the last several days he has had intermittent emesis only in the morning but then is able later to take food and liquids.  Denies any abdominal discomfort.  No fever with this.  Patient is scheduled for eye surgery in the near future.  Called EMS and blood sugar was 287.  He currently lives in a halfway house and does have some baseline right-sided paralysis.        Past Medical History:  Diagnosis Date  . Arthritis    "neck; maybe in my fingers" (11/06/2016)  . Asthma   . Bipolar disorder ([place])    "IED: intentional deficit disorder"  . Depression   . Heart murmur    "when I was a child"  . Hypertension   . Type II diabetes mellitus [facility])     Patient Active Problem List   Diagnosis Date Noted  . Cellulitis, leg 03/17/2020  . Cellulitis of right leg 03/17/2020  . Uncontrolled type 2 diabetes mellitus with complication, with long-term current use of insulin ([place])   . Abscess   . Cellulitis 12/05/2019  . Cellulitis of left lower extremity   . Abscess of left lower extremity   . Uncontrolled type 2 diabetes mellitus with hyperglycemia ([place])   . 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 2 diabetes mellitus ([place])   . Cellulitis of foot, left 11/22/2016  . Diabetic neuropathy ([place]) 11/22/2016  . Fracture of 2nd metatarsal 11/22/2016  . Fracture of 3rd metatarsal 11/22/2016  . Postprocedural non-healing wound 11/22/2016  . Acute cystitis with positive culture   . Cutaneous  abscess of left foot   . Osteomyelitis ([place]) 11/03/2016  . Poorly controlled type 2 diabetes mellitus with complication ([place]) 32/02/2481  . Hyperlipidemia 10/17/2016  . Depression 10/17/2016  . Neck pain   . Asthma 04/11/2016  . Uncontrolled type 2 diabetes mellitus with complication ([place])   . Obesity, morbid ([place]) 09/06/2015  . Hypertension 09/06/2015    Past Surgical History:  Procedure Laterality Date  . AMPUTATION Left 11/05/2016   Procedure: AMPUTATION LEFT GREAT TOE;  Surgeon: Newt Minion, MD;  Location: [place] [place];  Service: Orthopedics;  Laterality: Left;  . DRESSING CHANGE UNDER ANESTHESIA N/A 04/13/2016   Procedure: DRESSING CHANGE UNDER ANESTHESIA;  Surgeon: Johnathan Hausen, MD;  [facility];  Service: General;  Laterality: N/A;  . INCISION AND DRAINAGE ABSCESS Right 09/03/2015   Procedure: INCISION AND DRAINAGE ABSCESS;  Surgeon: Roseanne Kaufman, MD;  [facility];  Service: Orthopedics;  Laterality: Right;  . INCISION AND DRAINAGE PERIRECTAL ABSCESS N/A 04/11/2016   Procedure: IRRIGATION AND DEBRIDEMENT RECTAL ABSCESS;  Surgeon: Johnathan Hausen, MD;  [facility];  Service: General;  Laterality: N/A;  . [place]; 1989   x2   . IRRIGATION AND DEBRIDEMENT ABSCESS Left 08/31/2015   Procedure: IRRIGATION AND DEBRIDEMENT ABSCESS LEFT SHOULDER;  Surgeon: Alphonsa Overall, MD;  [facility];  Service: General;  Laterality: Left;  .  WOUND DEBRIDEMENT N/A 02/24/2016   Procedure: SACRAL WOUND DEBRIDEMENT;  Surgeon: Georganna Skeans, MD;  [facility];  Service: General;  Laterality: N/A;       Family History  Problem Relation Age of Onset  . Diabetes type II Mother   . Diabetes type II Father     Social History   Tobacco Use  . Smoking status: Never Smoker  . Smokeless tobacco: Never Used  Vaping Use  . Vaping Use: Never used  Substance Use Topics  . Alcohol use: Yes    Comment: 11/06/2016 "I'll drink a little at parties; maybe 5 times/year"  .  Drug use: Yes    Types: Marijuana    Comment: 11/06/2016 "stopped ~ 1 month ago"    Home Medications Prior to Admission medications   Medication Sig Start Date End Date Taking? Authorizing Provider  Accu-Chek Softclix Lancets lancets Check blood sugar BID. 12/30/19   Charlott Rakes, MD  Accu-Chek Softclix Lancets lancets CHECK BLOOD SUGAR TWO TIMES DAILY 12/30/19 12/29/20  Charlott Rakes, MD  acetaminophen (TYLENOL) 325 MG tablet Take 2 tablets (650 mg total) by mouth every 6 (six) hours as needed for mild pain or fever (for pain). Patient not taking: No sig reported 04/17/16   Regalado, Belkys A, MD  amLODipine (NORVASC) 10 MG tablet Take 1 tablet (10 mg total) by mouth daily. Patient not taking: Reported on 03/17/2020 12/30/19   Argentina Donovan, PA-C  amLODipine (NORVASC) 10 MG tablet TAKE 1 TABLET (10 MG TOTAL) BY MOUTH DAILY. 12/30/19 12/29/20  Argentina Donovan, PA-C  Blood Glucose Monitoring Suppl (ACCU-CHEK GUIDE) w/Device KIT 1 EACH BY DOES NOT APPLY ROUTE IN THE MORNING AND AT BEDTIME. 12/30/19 12/29/20  Charlott Rakes, MD  Blood Glucose Monitoring Suppl (TRUE METRIX METER) w/Device KIT 1 each by Does not apply route in the morning and at bedtime. 12/30/19   Charlott Rakes, MD  cefdinir (OMNICEF) 300 MG capsule Take 2 capsules (600 mg total) by mouth daily. 03/18/20   Geradine Girt, DO  cefdinir (OMNICEF) 300 MG capsule TAKE 2 CAPSULES (600 MG TOTAL) BY MOUTH DAILY. 03/18/20 03/18/21  Geradine Girt, DO  gabapentin (NEURONTIN) 300 MG capsule Take 1 capsule (300 mg total) by mouth 3 (three) times daily as needed (pain). Patient not taking: Reported on 03/17/2020 12/30/19   Argentina Donovan, PA-C  gabapentin (NEURONTIN) 300 MG capsule TAKE 1 CAPSULE (300 MG TOTAL) BY MOUTH 3 (THREE) TIMES DAILY AS NEEDED (PAIN). 12/30/19 12/29/20  Argentina Donovan, PA-C  glucose blood (TRUE METRIX BLOOD GLUCOSE TEST) test strip Use as instructed 12/30/19   Argentina Donovan, PA-C  glucose blood  test strip USE AS INSTRUCTED 12/30/19 12/29/20  Argentina Donovan, PA-C  insulin detemir (LEVEMIR) 100 UNIT/ML FlexPen Inject 20 Units into the skin 2 (two) times daily. Patient not taking: Reported on 03/17/2020 12/30/19   Argentina Donovan, PA-C  insulin lispro (HUMALOG KWIKPEN) 100 UNIT/ML KwikPen 10 units of short acting insulin premeal Patient not taking: Reported on 03/17/2020 01/27/20   Fulp, Cammie, MD  Insulin Pen Needle (PEN NEEDLES) 31G X 5 MM MISC 1 each by Does not apply route in the morning and at bedtime. 12/30/19   Charlott Rakes, MD  Insulin Pen Needle 31G X 5 MM MISC 1 EACH BY DOES NOT APPLY ROUTE IN THE MORNING AND AT BEDTIME. 12/30/19 12/29/20  Argentina Donovan, PA-C  ofloxacin (OCUFLOX) 0.3 % ophthalmic solution Instill 1 drop in left eye four times a  day; Begin one day after surgery, continue as directed. 07/18/20     pravastatin (PRAVACHOL) 20 MG tablet Take 1 tablet (20 mg total) by mouth daily. Patient not taking: Reported on 03/17/2020 12/30/19   Argentina Donovan, PA-C  pravastatin (PRAVACHOL) 20 MG tablet TAKE 1 TABLET (20 MG TOTAL) BY MOUTH DAILY. 12/30/19 12/29/20  Argentina Donovan, PA-C  prednisoLONE acetate (PRED FORTE) 1 % ophthalmic suspension Instill 1 drop in left eye four times a day; Begin one day after surgery, continue as directed. 07/18/20     silver sulfADIAZINE (SILVADENE) 1 % cream Apply topically 2 (two) times daily. 03/18/20   Geradine Girt, DO  sulfamethoxazole-trimethoprim (BACTRIM DS) 800-160 MG tablet Take 1 tablet by mouth 2 (two) times daily. 03/18/20   Geradine Girt, DO  sulfamethoxazole-trimethoprim (BACTRIM DS) 800-160 MG tablet TAKE 1 TABLET BY MOUTH 2 (TWO) TIMES DAILY. 03/18/20 03/18/21  Geradine Girt, DO    Allergies    Patient has no known allergies.  Review of Systems   Review of Systems  All other systems reviewed and are negative.   Physical Exam Updated Vital Signs BP (!) 192/121 (BP Location: Right Arm)   Pulse 86    Temp 99.1 F (37.3 C) (Oral)   Resp 18   Ht 1.676 m ('5\' 6"' )   Wt 105 kg   SpO2 100%   BMI 37.36 kg/m   Physical Exam Vitals and nursing note reviewed.  Constitutional:      General: He is not in acute distress.    Appearance: Normal appearance. He is well-developed. He is not toxic-appearing.  HENT:     Head: Normocephalic and atraumatic.  Eyes:     General: Lids are normal.     Conjunctiva/sclera: Conjunctivae normal.     Pupils: Pupils are equal, round, and reactive to light.  Neck:     Thyroid: No thyroid mass.     Trachea: No tracheal deviation.  Cardiovascular:     Rate and Rhythm: Normal rate and regular rhythm.     Heart sounds: Normal heart sounds. No murmur heard. No gallop.   Pulmonary:     Effort: Pulmonary effort is normal. No respiratory distress.     Breath sounds: Normal breath sounds. No stridor. No decreased breath sounds, wheezing, rhonchi or rales.  Abdominal:     General: Bowel sounds are normal. There is no distension.     Palpations: Abdomen is soft.     Tenderness: There is no abdominal tenderness. There is no rebound.  Musculoskeletal:        General: No tenderness. Normal range of motion.     Cervical back: Normal range of motion and neck supple.  Skin:    General: Skin is warm and dry.     Findings: No abrasion or rash.  Neurological:     Mental Status: He is alert and oriented to person, place, and time. Mental status is at baseline.     GCS: GCS eye subscore is 4. GCS verbal subscore is 5. GCS motor subscore is 6.     Cranial Nerves: Cranial nerves are intact. No cranial nerve deficit.  Psychiatric:        Attention and Perception: Attention normal.        Mood and Affect: Mood normal.        Speech: Speech normal.        Behavior: Behavior normal.     ED Results / Procedures / Treatments   Labs (all  labs ordered are listed, but only abnormal results are displayed) Labs Reviewed  CBG MONITORING, ED - Abnormal; Notable for the  following components:      Result Value   Glucose-Capillary 342 (*)    All other components within normal limits  CBC WITH DIFFERENTIAL/PLATELET    EKG None  Radiology No results found.  Procedures Procedures   Medications Ordered in ED Medications  0.9 %  sodium chloride infusion (has no administration in time range)  amLODipine (NORVASC) tablet 10 mg (has no administration in time range)  insulin detemir (LEVEMIR) FlexPen 20 Units (has no administration in time range)    ED Course  I have reviewed the triage vital signs and the nursing notes.  Pertinent labs & imaging results that were available during my care of the patient were reviewed by me and considered in my medical decision making (see chart for details).    MDM Rules/Calculators/A&P                          Patient does have evidence of worsening renal function.  Blood sugar 323 and treated with insulin.  Blood pressure treated with oral medications.  Did required 1 IV dose of labetalol.  Repeat blood pressure is stable.  Social work consult obtained and medication will [facility]. Final Clinical Impression(s) / ED Diagnoses Final diagnoses:  None    Rx / DC Orders ED Discharge Orders    None       Lacretia Leigh, MD 07/19/20 1636

## 2020-07-19 NOTE — Progress Notes (Addendum)
TOC CM contacted [facility] [employer] for door to door due to pt vision problems due to diabetes. TOC CM contacted [facility] rep for [facility] RN. Pt needs a PCS in the home. Will send message to pt's PCP at [facility]. Faxed [place] Medicaid PCS forms faxed to PCP's office to be completed. Unable to get provider to accept referral for [facility] due to payor.   [place] RN [place], [place] ED TOC CM [phone]

## 2020-07-19 NOTE — Progress Notes (Signed)
TOC CM spoke to pt and he gets meds with [facility]. Pt has appt on [place] appt on 07/27/2020. ED provider updated. [place], [place] ED TOC CM [phone]

## 2020-07-19 NOTE — ED Triage Notes (Addendum)
BIB EMS from home, patient complains of HTN and hyperglycemia with hx of same, has not had his insulin or HTN meds in months. States he is paralyzed on his R side and is unable to read his meds or fix himself food. CBG was 387 w/ EMS.

## 2020-07-19 NOTE — ED Notes (Signed)
CM / SW to arrange [facility] [employer] home.

## 2020-07-20 NOTE — Progress Notes (Signed)
07/20/2020 1200 TOC CM contacted pt to provide number for Medicaid transportation to his appts. States he has transportation to his appt on 07/27/2020 to see PCP. States he needs assistance at home. TOC CM contacted [facility] [facility] to follow up on referral. Jonnie Finner RN [place], [place] ED TOC CM [phone]

## 2020-07-21 ENCOUNTER — Telehealth: Payer: Self-pay

## 2020-07-21 NOTE — Telephone Encounter (Signed)
Call received from Marcelino Scot, [place] Management Team.  She explained that she spoke to the patient and is concerned about his ability to care for himself at home due to his poor eyesight and right side paralysis  She is requesting a home health referral and PCS referral as he has difficulty checking his blood sugars and managing his insulin.  He is currently residing in a boarding house and is not receiving proper meals.  He has upcoming eye surgery and she is concerned about him having surgery if his blood pressure and blood sugars are not controlled. Informed her that the patient does not have an appointment with a provider at [facility] until 07/27/2020.  He will need to be seen by a provider in order to place a PCS referral.  The hospital case manager tried to arrange home health care but the agencies were not able to accept him. He would need  to be seen by a provider in the  community prior to attempting to secure home health services.

## 2020-07-27 ENCOUNTER — Telehealth: Payer: Self-pay

## 2020-07-27 ENCOUNTER — Ambulatory Visit: Payer: Medicaid Other | Attending: Physician Assistant | Admitting: Physician Assistant

## 2020-07-27 ENCOUNTER — Other Ambulatory Visit: Payer: Self-pay

## 2020-07-27 ENCOUNTER — Encounter: Payer: Self-pay | Admitting: Physician Assistant

## 2020-07-27 VITALS — BP 162/78 | HR 89 | Resp 20 | Ht 66.0 in | Wt 227.8 lb

## 2020-07-27 DIAGNOSIS — Z794 Long term (current) use of insulin: Secondary | ICD-10-CM | POA: Diagnosis not present

## 2020-07-27 DIAGNOSIS — Z09 Encounter for follow-up examination after completed treatment for conditions other than malignant neoplasm: Secondary | ICD-10-CM

## 2020-07-27 DIAGNOSIS — G8191 Hemiplegia, unspecified affecting right dominant side: Secondary | ICD-10-CM

## 2020-07-27 DIAGNOSIS — E1165 Type 2 diabetes mellitus with hyperglycemia: Secondary | ICD-10-CM | POA: Diagnosis not present

## 2020-07-27 DIAGNOSIS — I10 Essential (primary) hypertension: Secondary | ICD-10-CM | POA: Diagnosis not present

## 2020-07-27 DIAGNOSIS — E1142 Type 2 diabetes mellitus with diabetic polyneuropathy: Secondary | ICD-10-CM | POA: Diagnosis not present

## 2020-07-27 DIAGNOSIS — H539 Unspecified visual disturbance: Secondary | ICD-10-CM | POA: Diagnosis not present

## 2020-07-27 DIAGNOSIS — IMO0002 Reserved for concepts with insufficient information to code with codable children: Secondary | ICD-10-CM

## 2020-07-27 LAB — POCT GLYCOSYLATED HEMOGLOBIN (HGB A1C): HbA1c, POC (controlled diabetic range): 10.5 % — AB (ref 0.0–7.0)

## 2020-07-27 LAB — GLUCOSE, POCT (MANUAL RESULT ENTRY)
POC Glucose: 307 mg/dl — AB (ref 70–99)
POC Glucose: 316 mg/dl — AB (ref 70–99)

## 2020-07-27 MED ORDER — INSULIN PEN NEEDLE 31G X 5 MM MISC
2 refills | Status: DC
  Filled 2020-07-27: qty 100, 25d supply, fill #0

## 2020-07-27 MED ORDER — GABAPENTIN 300 MG PO CAPS
300.0000 mg | ORAL_CAPSULE | Freq: Three times a day (TID) | ORAL | 2 refills | Status: DC | PRN
  Filled 2020-07-27: qty 90, 30d supply, fill #0

## 2020-07-27 MED ORDER — AMLODIPINE BESYLATE 10 MG PO TABS
ORAL_TABLET | Freq: Every day | ORAL | 0 refills | Status: DC
  Filled 2020-07-27: qty 90, fill #0

## 2020-07-27 MED ORDER — INSULIN ASPART 100 UNIT/ML IJ SOLN
10.0000 [IU] | Freq: Once | INTRAMUSCULAR | Status: AC
  Administered 2020-07-27: 10 [IU] via SUBCUTANEOUS

## 2020-07-27 MED ORDER — GLIPIZIDE 5 MG PO TABS
5.0000 mg | ORAL_TABLET | Freq: Two times a day (BID) | ORAL | 3 refills | Status: DC
  Filled 2020-07-27: qty 60, 30d supply, fill #0

## 2020-07-27 MED ORDER — PRAVASTATIN SODIUM 20 MG PO TABS
ORAL_TABLET | Freq: Every day | ORAL | 3 refills | Status: DC
Start: 2020-07-27 — End: 2020-08-10
  Filled 2020-07-27: qty 90, 90d supply, fill #0

## 2020-07-27 MED ORDER — AMLODIPINE BESYLATE 10 MG PO TABS
10.0000 mg | ORAL_TABLET | Freq: Every day | ORAL | 0 refills | Status: DC
  Filled 2020-07-27: qty 90, 90d supply, fill #0

## 2020-07-27 MED ORDER — INSULIN DETEMIR 100 UNIT/ML FLEXPEN
25.0000 [IU] | PEN_INJECTOR | Freq: Every day | SUBCUTANEOUS | 3 refills | Status: DC
  Filled 2020-07-27: qty 15, 60d supply, fill #0

## 2020-07-27 MED ORDER — INSULIN DETEMIR 100 UNIT/ML FLEXPEN
20.0000 [IU] | PEN_INJECTOR | Freq: Two times a day (BID) | SUBCUTANEOUS | 3 refills | Status: DC
  Filled 2020-07-27: qty 15, 38d supply, fill #0

## 2020-07-27 NOTE — Progress Notes (Signed)
Patient ID: [name] [name], male   DOB: [birth year], 35 y.o.   MRN: [id number]   [name] [name], is a 35 y.o. male  OMV:[id number]  GGE:[id number]  DOB - 09-20-1985  Subjective:  Chief Complaint and HPI: [name] [name] is a 35 y.o. male here today to establish care and for a follow up visit after being seen in the ED 5/5 for hyperglycemia and elevated blood pressure.  He has not been taking his meds for many months. 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  He has trouble with his med bottles and can not read the directions on the vials or insulin pen.  He says he lives alone and has no help.  No friends or family are available to help him.  He is requesting HHS and PCS.  He was previously a patient of Dr Siri Cole and has not been assigned a new PCP here.    ED/Hospital notes reviewed.     ROS:   Constitutional:  No f/c, No night sweats, No unexplained weight loss. EENT:  No hearing changes. No mouth, throat, or ear problems.  Respiratory: No cough, No SOB Cardiac: No CP, no palpitations GI:  No abd pain, No N/V/D. GU: No Urinary s/sx Musculoskeletal: No joint pain Neuro: No headache, no dizziness, no new motor weakness.  Skin: No rash Endocrine:  No polydipsia. No polyuria.  Psych: Denies SI/HI  No problems updated.  ALLERGIES: No Known Allergies  PAST MEDICAL HISTORY: Past Medical History:  Diagnosis Date  . Arthritis    "neck; maybe in my fingers" (11/06/2016)  . Asthma   . Bipolar disorder ([place])    "IED: intentional deficit disorder"  . Depression   . Heart murmur    "when I was a child"  . Hypertension   . Type II diabetes mellitus ([place])     MEDICATIONS AT HOME: Prior to Admission medications   Medication Sig Start Date End Date Taking? Authorizing Provider  Accu-Chek Softclix Lancets lancets Check blood sugar BID. 12/30/19  Yes Charlott Rakes, MD  Accu-Chek Softclix  Lancets lancets CHECK BLOOD SUGAR TWO TIMES DAILY Patient taking differently: CHECK BLOOD SUGAR TWO TIMES DAILY 12/30/19 12/29/20 Yes Charlott Rakes, MD  acetaminophen (TYLENOL) 325 MG tablet Take 2 tablets (650 mg total) by mouth every 6 (six) hours as needed for mild pain or fever (for pain). 04/17/16  Yes Regalado, Belkys A, MD  amLODipine (NORVASC) 10 MG tablet Take 1 tablet (10 mg total) by mouth daily. 12/30/19  Yes Kailen Hinkle M, PA-C  Blood Glucose Monitoring Suppl (ACCU-CHEK GUIDE) w/Device KIT 1 EACH BY DOES NOT APPLY ROUTE IN THE MORNING AND AT BEDTIME. 12/30/19 12/29/20 Yes Newlin, Enobong, MD  Blood Glucose Monitoring Suppl (TRUE METRIX METER) w/Device KIT 1 each by Does not apply route in the morning and at bedtime. 12/30/19  Yes Charlott Rakes, MD  glipiZIDE (GLUCOTROL) 5 MG tablet Take 1 tablet (5 mg total) by mouth 2 (two) times daily before a meal. 07/27/20  Yes Tylen Leverich M, PA-C  glucose blood (TRUE METRIX BLOOD GLUCOSE TEST) test strip Use as instructed 12/30/19  Yes Argentina Donovan, PA-C  glucose blood test strip USE AS INSTRUCTED 12/30/19 12/29/20 Yes Ardythe Klute M, PA-C  insulin lispro (HUMALOG KWIKPEN) 100 UNIT/ML KwikPen 10 units of short acting insulin premeal 01/27/20  Yes Fulp, Cammie, MD  Insulin Pen Needle (PEN NEEDLES) 31G X 5 MM MISC  1 each by Does not apply route in the morning and at bedtime. 12/30/19  Yes Charlott Rakes, MD  ofloxacin (OCUFLOX) 0.3 % ophthalmic solution Instill 1 drop in left eye four times a day; Begin one day after surgery, continue as directed. 07/18/20  Yes   prednisoLONE acetate (PRED FORTE) 1 % ophthalmic suspension Instill 1 drop in left eye four times a day; Begin one day after surgery, continue as directed. 07/18/20  Yes   amLODipine (NORVASC) 10 MG tablet TAKE 1 TABLET (10 MG TOTAL) BY MOUTH DAILY. 07/27/20 07/27/21  Argentina Donovan, PA-C  gabapentin (NEURONTIN) 300 MG capsule Take 1 capsule (300 mg total) by mouth 3 (three)  times daily as needed (pain). 07/27/20   Argentina Donovan, PA-C  insulin detemir (LEVEMIR) 100 UNIT/ML FlexPen Inject 25 Units into the skin at bedtime. 07/27/20   Argentina Donovan, PA-C  Insulin Pen Needle 31G X 5 MM MISC 1 EACH BY DOES NOT APPLY ROUTE IN THE MORNING AND AT BEDTIME. 07/27/20 07/27/21  Argentina Donovan, PA-C  pravastatin (PRAVACHOL) 20 MG tablet TAKE 1 TABLET (20 MG TOTAL) BY MOUTH DAILY. 07/27/20 07/27/21  Argentina Donovan, PA-C     Objective:  EXAM:   Vitals:   07/27/20 1120  BP: (!) 162/78  Pulse: 89  Resp: 20  SpO2: 98%  Weight: 227 lb 12.8 oz (103.3 kg)  Height: '5\' 6"'  (1.676 m)    General appearance : A&OX3. NAD. Non-toxic-appearing HEENT: Atraumatic and Normocephalic.  PERRLA. EOM intact.   Neck: supple, no JVD. No cervical lymphadenopathy. No thyromegaly Chest/Lungs:  Breathing-non-labored, Good air entry bilaterally, breath sounds normal without rales, rhonchi, or wheezing  CVS: S1 S2 regular, no murmurs, gallops, rubs  Limited ROM on RUE.  Ambulates without assistance Extremities: Bilateral Lower Ext shows no edema, both legs are warm to touch with = pulse throughout Neurology:  CN II-XII grossly intact, Non focal.   Psych:  TP linear. J/I fair. Normal speech. Appropriate eye contact and affect.  Skin:  No Rash  Data Review Lab Results  Component Value Date   HGBA1C 10.5 (A) 07/27/2020   HGBA1C 10.9 (H) 03/17/2020   HGBA1C 11.3 (H) 12/06/2019     Assessment & Plan   1. Uncontrolled type 2 diabetes mellitus with complication, with long-term current use of insulin ([place]) With non-compliance due in part to vision issues and partial hemiparesis of the R side.  Will try and decrease the complication of his medication schedule(change levemir to once daily dosing and add glipizide and titrate up in a few weeks.  I have instructed him to check his blood sugars fasting and bedtime and bring meter with him as it will store the readings and we can titrate meds  accordingly - Glucose (CBG) - HgB A1c - pravastatin (PRAVACHOL) 20 MG tablet; TAKE 1 TABLET (20 MG TOTAL) BY MOUTH DAILY.  Dispense: 90 tablet; Refill: 3 - insulin aspart (novoLOG) injection 10 Units - glipiZIDE (GLUCOTROL) 5 MG tablet; Take 1 tablet (5 mg total) by mouth 2 (two) times daily before a meal.  Dispense: 60 tablet; Refill: 3 - Insulin Pen Needle 31G X 5 MM MISC; 1 EACH BY DOES NOT APPLY ROUTE IN THE MORNING AND AT BEDTIME.  Dispense: 100 each; Refill: 2 - Glucose (CBG) - insulin detemir (LEVEMIR) 100 UNIT/ML FlexPen; Inject 25 Units into the skin at bedtime.  Dispense: 15 mL; Refill: 3  2. Primary hypertension Resume amlodipine 71m daily  3. Diabetic polyneuropathy associated with  type 2 diabetes mellitus (HCC) - gabapentin (NEURONTIN) 300 MG capsule; Take 1 capsule (300 mg total) by mouth 3 (three) times daily as needed (pain).  Dispense: 90 capsule; Refill: 2  4. Change in vision-has upcoming surgeries scheduled - Ambulatory referral to [place]  5. 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 Encounter for examination following treatment at hospital     Patient have been counseled extensively about nutrition and exercise  Return in about 4 weeks (around 08/24/2020) for [facility] for Diabetes check and 2 months assign PCP.  The patient was given clear instructions to go to ER or return to medical center if symptoms don't improve, worsen or new problems develop. The patient verbalized understanding. The patient was told to call to get lab results if they haven't heard anything in the next week.     Freeman Caldron, PA-C [facility] and [facility], [place]   07/27/2020, 12:08 PM

## 2020-07-27 NOTE — Patient Instructions (Signed)
Check blood sugars fasting and bedtime and record and bring to next visit 

## 2020-07-27 NOTE — Telephone Encounter (Signed)
Faxed PCS information to [facility] Medicaid on 07/27/20

## 2020-07-27 NOTE — Telephone Encounter (Signed)
Referral received for PCS and home health RN.  As per Freeman Caldron, PA, the patient has no preference for home health agencies.   PCS application completed and faxed to [facility].  # [id number].  This number was obtained from [employer] with [facility] # [phone] .  After 35 minutes on the phone , she was not able to determine the name of the department that handles PCS requests and she provided this CM with the fax number that is noted.   Call placed to [facility] # [phone] regarding home health nursing services.  Spoke to [place] who said they cannot accept the referral.    Referral faxed to [place] for review

## 2020-07-28 NOTE — Telephone Encounter (Signed)
Call received from [facility] stating that they are not able to accept the referral.  Call placed to [facility] # [phone] , spoke to [place] who is not sure if they are in network with [facility] Medicaid. She requested referral be faxed for review # [phone].  Referral then faxed as requested.

## 2020-07-28 NOTE — Telephone Encounter (Signed)
Call received from [facility]. She stated that they can accept the referral but will not be able to start care until at least the middle to end of next week.  This CM gave her authorization for delay in start of care. [name] [name] said she is not a [employer] scientist and if she needs to have a nurse take the approval for delay start of care, she will have the nurse call back.

## 2020-08-04 ENCOUNTER — Other Ambulatory Visit: Payer: Self-pay

## 2020-08-04 ENCOUNTER — Telehealth: Payer: Self-pay | Admitting: Internal Medicine

## 2020-08-04 NOTE — Telephone Encounter (Signed)
Dr. Wynetta Emery would you be able to do a virtual visit with pt

## 2020-08-04 NOTE — Telephone Encounter (Signed)
Amy rn with [facility] is calling to let provider. Pt is blind and can not see to draw up his insulin. Pt is not home health appropriate he needs a higher level of care then they can provider

## 2020-08-04 NOTE — Telephone Encounter (Signed)
Opal Sidles would you be able to assist with this

## 2020-08-05 NOTE — Telephone Encounter (Signed)
There are no openings with angela and we are not able to schedule pt with cari. They will have to show up and it's first come first serve

## 2020-08-08 NOTE — Telephone Encounter (Signed)
Could not determine who the pt saw last advised Berenice Primas to fax forms to [place]. Please advise.

## 2020-08-08 NOTE — Telephone Encounter (Signed)
-  [name] [name] - the pt has contacted the agency for Home health services. [facility] does accepted Medicaid. But has not been approved to see him. Please advise. Form can be faxed or can call for approval fax number to go to [place] [phone]

## 2020-08-08 NOTE — Telephone Encounter (Addendum)
Call returned to [facility] # [phone] regarding home health services.  She explained that they do PCS for medicaid but a 3051 form needs to be sent to [employer].  This CM explained that the form has already been sent to [facility]. [employer] does not service managed medicaid patients. [employer]  will be contacting the patient.  Berenice Primas confirmed that her agency is in network with [facility] as well as [facility].   Call placed to patient to discuss his services.  He explained that he is having a hard time managing his medications and caring for himself due to paralysis and loss of vision.  He is aware that [facility] home [facility] agency will not accept him because he needs more care than they can provide. He would appreciate the personal care assistance.  This CM explained that a referral has been sent to [facility] for [facility] and he can contact his insurance company to check the status of the referral.   He also needs to schedule appointment to establish care.  He said he could come tomorrow  - 08/09/2020 @ 1450 if transportation is arranged.  Informed him that this CM will contact [employer] to schedule a ride.  He said that if he is picked up at the front door he will need assistance getting out of the house, if he is picked up at the back door, he should be able to get outdoors independently. Informed him that [facility] will contact him about the ride details. Instructed him to bring his medications with him to his appointment.    Call placed to [employer], spoke to [place] and requested pick up tomorrow. He said that they are aware that the patient needs door to door assistance.  He will call the patient to discuss pick up.    Call received from [facility] who confirmed the ride has been arranged for the appointment tomorrow and patient is aware. Marland Kitchen

## 2020-08-09 ENCOUNTER — Ambulatory Visit: Payer: Medicaid Other | Attending: Internal Medicine | Admitting: Internal Medicine

## 2020-08-09 ENCOUNTER — Other Ambulatory Visit: Payer: Self-pay

## 2020-08-09 ENCOUNTER — Telehealth: Payer: Self-pay

## 2020-08-09 ENCOUNTER — Encounter: Payer: Self-pay | Admitting: Internal Medicine

## 2020-08-09 VITALS — BP 191/125 | HR 91 | Resp 16 | Ht 66.5 in | Wt 227.6 lb

## 2020-08-09 DIAGNOSIS — G8191 Hemiplegia, unspecified affecting right dominant side: Secondary | ICD-10-CM

## 2020-08-09 DIAGNOSIS — E1142 Type 2 diabetes mellitus with diabetic polyneuropathy: Secondary | ICD-10-CM | POA: Diagnosis not present

## 2020-08-09 DIAGNOSIS — E1121 Type 2 diabetes mellitus with diabetic nephropathy: Secondary | ICD-10-CM | POA: Diagnosis not present

## 2020-08-09 DIAGNOSIS — E113299 Type 2 diabetes mellitus with mild nonproliferative diabetic retinopathy without macular edema, unspecified eye: Secondary | ICD-10-CM

## 2020-08-09 DIAGNOSIS — L739 Follicular disorder, unspecified: Secondary | ICD-10-CM

## 2020-08-09 DIAGNOSIS — L97421 Non-pressure chronic ulcer of left heel and midfoot limited to breakdown of skin: Secondary | ICD-10-CM

## 2020-08-09 DIAGNOSIS — E08621 Diabetes mellitus due to underlying condition with foot ulcer: Secondary | ICD-10-CM

## 2020-08-09 DIAGNOSIS — L989 Disorder of the skin and subcutaneous tissue, unspecified: Secondary | ICD-10-CM

## 2020-08-09 DIAGNOSIS — I152 Hypertension secondary to endocrine disorders: Secondary | ICD-10-CM

## 2020-08-09 DIAGNOSIS — E1159 Type 2 diabetes mellitus with other circulatory complications: Secondary | ICD-10-CM

## 2020-08-09 LAB — GLUCOSE, POCT (MANUAL RESULT ENTRY): POC Glucose: 350 mg/dl — AB (ref 70–99)

## 2020-08-09 MED ORDER — SULFAMETHOXAZOLE-TRIMETHOPRIM 800-160 MG PO TABS
1.0000 | ORAL_TABLET | Freq: Two times a day (BID) | ORAL | 0 refills | Status: DC
  Filled 2020-08-09: qty 14, 7d supply, fill #0

## 2020-08-09 MED ORDER — METFORMIN HCL 500 MG PO TABS
1000.0000 mg | ORAL_TABLET | Freq: Every day | ORAL | 3 refills | Status: DC
  Filled 2020-08-09: qty 30, 15d supply, fill #0

## 2020-08-09 MED ORDER — TRULICITY 0.75 MG/0.5ML ~~LOC~~ SOAJ
0.7500 mg | SUBCUTANEOUS | 3 refills | Status: DC
Start: 2020-08-09 — End: 2020-08-10
  Filled 2020-08-09: qty 2, 28d supply, fill #0

## 2020-08-09 NOTE — Patient Instructions (Signed)
Stop Insulins. Start Trulicity and Metformin.

## 2020-08-09 NOTE — Telephone Encounter (Signed)
Met with the patient when he was in the clinic for his appointment today.  He explained his difficulties with administering his insulin, managing his medications, checking his blood sugars and preparing meals.   He worked with Benard Halsted, [place] to assess how he can best manage his medications/insulin.  He was agreeable to placing a referral to [employer].  Explained to him that there is no guarantee that he will be approved and he understood.  Referral then sent to [place] for review.   He was also agreeable to having a referral made to [facility] and that referral was also placed

## 2020-08-09 NOTE — Telephone Encounter (Signed)
Reached out to [employer] [facility] [employer] [facility] for information to assist the patient with independent living skills. LVM for [place] to return a phone call to [facility].

## 2020-08-09 NOTE — Progress Notes (Signed)
Patient ID: [name] [name], male    DOB: June 06, 1985  MRN: [id number]  CC: Hospitalization Follow-up   Subjective: [name] [name] is a 35 y.o. male who presents for re-est care.  Previous PCP is Dr. Chapman Fitch who is no longer with the practice. 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  Separated.  Has 2 young children who were adopted from him No family in the area.  Has a couple of friends.  Not in any support groups. He does not drive.  License revoked in 2683 due to failure to pay tickets and currently has poor vision. With history of diabetes with complications, HTN, HL, CKD.  He has not taken any of his medications including his insulins in the past few months due to challenges with his vision and right leg weakness.  Sometimes friends will come by to help but not often.  -eating mainly frozen dinners and Hot Pockets -has insulin pens Humalog and Levemir Does not check BS. Had cataract extraction LT eye last wk.  Has retina surgery on same eye tomorrow. RT eye will be done sometime next mth -Last GFR greater than 60.  Creatinine range 1.27-1.45.  Microalbumin 3.4 gram 01/2020 -Most recent H&H 11.9/33.9  Complains of bumps over the anterior chest wall which she states has had for several weeks.  Has a lesion on the left foot which she states has been present for over 2 years and is gradually healing.  Last saw podiatrist Dr. Cleda Mccreedy MCDonald in November of last year.   Had CT of the foot which showed these findings: IMPRESSION: 1. 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 2. 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 Patient Active Problem List   Diagnosis Date Noted  . Cellulitis, leg 03/17/2020  . Cellulitis of right leg 03/17/2020  . Uncontrolled type 2 diabetes mellitus with complication, with long-term current use of insulin ([place])   . Abscess   . Cellulitis 12/05/2019  . Cellulitis of left lower extremity   . Abscess of left lower extremity   . Uncontrolled type 2 diabetes mellitus with hyperglycemia ([place])   . 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 2 diabetes mellitus ([place])   . Cellulitis of foot, left 11/22/2016  . Diabetic neuropathy ([place]) 11/22/2016  . Fracture of 2nd metatarsal 11/22/2016  . Fracture of 3rd metatarsal 11/22/2016  . Postprocedural non-healing wound 11/22/2016  . Acute cystitis with positive culture   . Cutaneous abscess of left foot   . Osteomyelitis ([place]) 11/03/2016  . Poorly controlled type 2 diabetes mellitus with complication ([place]) 41/96/2229  . Hyperlipidemia 10/17/2016  . Depression 10/17/2016  . Neck pain   . Asthma 04/11/2016  . Uncontrolled type 2 diabetes mellitus with complication ([place])   .  Obesity, morbid ([place]) 09/06/2015  . Hypertension 09/06/2015     Current Outpatient Medications on File Prior to Visit  Medication Sig Dispense Refill  . Accu-Chek Softclix Lancets lancets Check blood sugar BID. 100 each 2  . Accu-Chek Softclix Lancets lancets CHECK BLOOD SUGAR TWO TIMES DAILY (Patient taking differently: CHECK BLOOD SUGAR TWO TIMES DAILY) 100 each 2  . acetaminophen (TYLENOL) 325 MG tablet Take 2 tablets (650 mg total) by mouth every 6 (six) hours as needed  for mild pain or fever (for pain). 10 tablet 0  . Blood Glucose Monitoring Suppl (ACCU-CHEK GUIDE) w/Device KIT 1 EACH BY DOES NOT APPLY ROUTE IN THE MORNING AND AT BEDTIME. 1 kit 0  . Blood Glucose Monitoring Suppl (TRUE METRIX METER) w/Device KIT 1 each by Does not apply route in the morning and at bedtime. 1 kit 0  . gabapentin (NEURONTIN) 300 MG capsule Take 1 capsule (300 mg total) by mouth 3 (three) times daily as needed (pain). 90 capsule 2  . glucose blood (TRUE METRIX BLOOD GLUCOSE TEST) test strip Use as instructed 100 each 12  . glucose blood test strip USE AS INSTRUCTED 100 strip 12  . Insulin Pen Needle (PEN NEEDLES) 31G X 5 MM MISC 1 each by Does not apply route in the morning and at bedtime. 100 each 2  . Insulin Pen Needle 31G X 5 MM MISC 1 EACH BY DOES NOT APPLY ROUTE IN THE MORNING AND AT BEDTIME. 100 each 2  . ofloxacin (OCUFLOX) 0.3 % ophthalmic solution Instill 1 drop in left eye four times a day; Begin one day after surgery, continue as directed. 5 mL 5  . pravastatin (PRAVACHOL) 20 MG tablet TAKE 1 TABLET (20 MG TOTAL) BY MOUTH DAILY. 90 tablet 3  . prednisoLONE acetate (PRED FORTE) 1 % ophthalmic suspension Instill 1 drop in left eye four times a day; Begin one day after surgery, continue as directed. 5 mL 5   No current facility-administered medications on file prior to visit.    No Known Allergies  Social History   Socioeconomic History  . Marital status: Married    Spouse name: Not on file  . Number of children: Not on file  . Years of education: Not on file  . Highest education level: Not on file  Occupational History  . Not on file  Tobacco Use  . Smoking status: Never Smoker  . Smokeless tobacco: Never Used  Vaping Use  . Vaping Use: Never used  Substance and Sexual Activity  . Alcohol use: Yes    Comment: 11/06/2016 "I'll drink a little at parties; maybe 5 times/year"  . Drug use: Yes    Types: Marijuana    Comment: 11/06/2016 "stopped ~ 1 month ago"   . Sexual activity: Never    Birth control/protection: Diaphragm  Other Topics Concern  . Not on file  Social History Narrative  . Not on file   Social Determinants of Health   Financial Resource Strain: Not on file  Food Insecurity: Not on file  Transportation Needs: Not on file  Physical Activity: Not on file  Stress: Not on file  Social Connections: Not on file  Intimate Partner Violence: Not on file    Family History  Problem Relation Age of Onset  . Diabetes type II Mother   . Diabetes type II Father     Past Surgical History:  Procedure Laterality Date  . AMPUTATION Left 11/05/2016   Procedure: AMPUTATION LEFT GREAT TOE;  Surgeon:  Newt Minion, MD;  Location: [facility];  Service: Orthopedics;  Laterality: Left;  . CATARACT EXTRACTION Left 07/2020  . DRESSING CHANGE UNDER ANESTHESIA N/A 04/13/2016   Procedure: DRESSING CHANGE UNDER ANESTHESIA;  Surgeon: Johnathan Hausen, MD;  Location: [facility];  Service: General;  Laterality: N/A;  . INCISION AND DRAINAGE ABSCESS Right 09/03/2015   Procedure: INCISION AND DRAINAGE ABSCESS;  Surgeon: Roseanne Kaufman, MD;  Location: [facility];  Service: Orthopedics;  Laterality: Right;  . INCISION AND DRAINAGE PERIRECTAL ABSCESS N/A 04/11/2016   Procedure: IRRIGATION AND DEBRIDEMENT RECTAL ABSCESS;  Surgeon: Johnathan Hausen, MD;  [facility];  Service: General;  Laterality: N/A;  . [place]; 1989   x2   . IRRIGATION AND DEBRIDEMENT ABSCESS Left 08/31/2015   Procedure: IRRIGATION AND DEBRIDEMENT ABSCESS LEFT SHOULDER;  Surgeon: Alphonsa Overall, MD;  Location: [facility];  Service: General;  Laterality: Left;  . WOUND DEBRIDEMENT N/A 02/24/2016   Procedure: SACRAL WOUND DEBRIDEMENT;  Surgeon: Georganna Skeans, MD;  Location: [facility];  Service: General;  Laterality: N/A;    ROS: Review of Systems Negative except as stated above  PHYSICAL EXAM: BP (!) 191/125   Pulse 91   Resp 16   Ht 5' 6.5" (1.689 m)   Wt 227 lb 9.6 oz (103.2 kg)    SpO2 98%   BMI 36.19 kg/m   Wt Readings from Last 3 Encounters:  08/09/20 227 lb 9.6 oz (103.2 kg)  07/27/20 227 lb 12.8 oz (103.3 kg)  07/19/20 231 lb 7.7 oz (105 kg)   Depression screen [facility] 2/9 07/27/2020 01/27/2020 12/30/2019  Decreased Interest 0 1 0  Down, Depressed, Hopeless 0 3 0  PHQ - 2 Score 0 4 0  Altered sleeping 0 2 -  Tired, decreased energy 0 2 -  Change in appetite 0 1 -  Feeling bad or failure about yourself  0 2 -  Trouble concentrating 0 1 -  Moving slowly or fidgety/restless 0 0 -  Suicidal thoughts 0 1 -  PHQ-9 Score 0 13 -  Some recent data might be hidden     Physical Exam  General appearance -young to middle-aged Caucasian male who is in no acute distress.  Appears chronically ill. Mental status -flat affect.  Answers questions appropriately.   Neck - no LN or thyroid enlargement Chest - clear to auscultation, no wheezes, rales or rhonchi, symmetric air entry Heart - normal rate, regular rhythm, normal S1, S2, no murmurs, rubs, clicks or gallops Neurological -noted to drag the right leg with ambulation.  Mild flexion contracture of the right wrist.  Grip left 5/5; right 3/5.  Power proximally upper extremities 5/5 left, 4/5 right. Extremities -trace lower extremity edema Skin -several circular dry scab lesions noted on the lower extremities left more so than the right.  Small pustules noted in the hair on his chest anteriorly. Diabetic Foot Exam - Simple   Simple Foot Form Visual Inspection See comments: Yes Sensation Testing See comments: Yes Pulse Check See comments: Yes Comments Posterior tibialis and dorsalis pedis pulses decreased on the right.  3+ on the left.  Chronic umbilicated 3 to 4 cm lesion noted laterally dorsal surface left foot.  Noted to have amputation of the left first and second toes.  LEAP exam abnormal bilaterally.          Results for orders placed or performed in visit on 08/09/20  POCT glucose (manual entry)   Result Value Ref Range   POC  Glucose 350 (A) 70 - 99 mg/dl    Depression screen [facility] 2/9 07/27/2020 01/27/2020 12/30/2019  Decreased Interest 0 1 0  Down, Depressed, Hopeless 0 3 0  PHQ - 2 Score 0 4 0  Altered sleeping 0 2 -  Tired, decreased energy 0 2 -  Change in appetite 0 1 -  Feeling bad or failure about yourself  0 2 -  Trouble concentrating 0 1 -  Moving slowly or fidgety/restless 0 0 -  Suicidal thoughts 0 1 -  PHQ-9 Score 0 13 -  Some recent data might be hidden     CMP Latest Ref Rng & Units 07/19/2020 03/18/2020 03/17/2020  Glucose 70 - 99 mg/dL 323(H) 210(H) -  BUN 6 - 20 mg/dL 25(H) 16 -  Creatinine 0.61 - 1.24 mg/dL 1.45(H) 1.27(H) 1.42(H)  Sodium 135 - 145 mmol/L 136 136 -  Potassium 3.5 - 5.1 mmol/L 4.0 3.7 -  Chloride 98 - 111 mmol/L 104 104 -  CO2 22 - 32 mmol/L 25 23 -  Calcium 8.9 - 10.3 mg/dL 8.5(L) 9.0 -  Total Protein 6.5 - 8.1 g/dL 6.5 - -  Total Bilirubin 0.3 - 1.2 mg/dL 0.3 - -  Alkaline Phos 38 - 126 U/L 80 - -  AST 15 - 41 U/L 11(L) - -  ALT 0 - 44 U/L 12 - -   Lipid Panel     Component Value Date/Time   CHOL 125 12/06/2019 0749   TRIG 117 12/06/2019 0749   HDL 27 (L) 12/06/2019 0749   CHOLHDL 4.6 12/06/2019 0749   VLDL 23 12/06/2019 0749   LDLCALC 75 12/06/2019 0749    CBC    Component Value Date/Time   WBC 10.2 07/19/2020 1413   RBC 4.13 (L) 07/19/2020 1413   HGB 11.9 (L) 07/19/2020 1413   HGB 11.7 (L) 01/27/2020 1209   HCT 33.9 (L) 07/19/2020 1413   HCT 36.6 (L) 01/27/2020 1209   PLT 246 07/19/2020 1413   PLT 284 01/27/2020 1209   MCV 82.1 07/19/2020 1413   MCV 88 01/27/2020 1209   MCH 28.8 07/19/2020 1413   MCHC 35.1 07/19/2020 1413   RDW 12.9 07/19/2020 1413   RDW 13.7 01/27/2020 1209   LYMPHSABS 1.9 07/19/2020 1413   LYMPHSABS 1.7 01/27/2020 1209   MONOABS 0.5 07/19/2020 1413   EOSABS 0.2 07/19/2020 1413   EOSABS 0.1 01/27/2020 1209   BASOSABS 0.1 07/19/2020 1413   BASOSABS 0.1 01/27/2020 1209    ASSESSMENT AND  PLAN: 1. Type 2 diabetes mellitus with background retinopathy ([place]) -Patient's case is challenging given poor vision and right hemiparesis. Discussion held with patient, our clinical pharmacist and case worker in the exam room. Clinical pharmacist tried to teach technique with administering insulin via the insulin pen but it became quickly apparent that patient will not be able to do it due to his physical limitations.  We discussed whether an insulin pump would be an alternative but again this may be challenging due to vision limitation. -Best option we came up with was once weekly Trulicity with daily metformin.  He was able to learn to our satisfaction how to give the Trulicity to himself. Will have clinical pharmacist check into whether his insurance will pay for [facility]. -Caseworker to find a pharmacy that can do blister packs for his oral medications.  I will try to simplify his medications as much as possible. -Case worker to arrange for home health aide and home EMT program for added  support - POCT glucose (manual entry) - Dulaglutide (TRULICITY) 3.08 MV/7.8IO SOPN; Inject 0.75 mg into the skin once a week.  Dispense: 2 mL; Refill: 3 - metFORMIN (GLUCOPHAGE) 500 MG tablet; Take 2 tablets (1,000 mg total) by mouth daily with breakfast.  Dispense: 30 tablet; Refill: 3  2. Diabetic ulcer of left midfoot associated with diabetes mellitus due to underlying condition, limited to breakdown of skin [facility]) Will refer to wound clinic for help with getting this ulcer to heal.  We will also get him back with Dr. Sherryle Lis  3. Hypertension associated with diabetes ([place]) Uncontrolled as patient has not been taking Norvasc.  Given the macroalbuminuria, I will change Norvasc to lisinopril/HCTZ.  Again caseworker to find a pharmacy that does blister packs and can deliver to him. - lisinopril-hydrochlorothiazide (ZESTORETIC) 10-12.5 MG tablet; Take 1 tablet by mouth daily.  Dispense: 90 tablet; Refill:  3  4. Macroalbuminuric diabetic nephropathy ([place]) See #3 above - lisinopril-hydrochlorothiazide (ZESTORETIC) 10-12.5 MG tablet; Take 1 tablet by mouth daily.  Dispense: 90 tablet; Refill: 3  5. Hemiparesis of right dominant side due to non-cerebrovascular etiology [facility]) Will refer for physical therapy  6. Diabetic polyneuropathy associated with type 2 diabetes mellitus ([place]) Need for better diabetes control.  See #1 above. We will change gabapentin to twice a day dosing for simplicity  7. Folliculitis - sulfamethoxazole-trimethoprim (BACTRIM DS) 800-160 MG tablet; Take 1 tablet by mouth 2 (two) times daily.  Dispense: 14 tablet; Refill: 0    Patient was given the opportunity to ask questions.  Patient verbalized understanding of the plan and was able to repeat key elements of the plan.   Orders Placed This Encounter  Procedures  . Ambulatory referral to Wound Clinic  . Ambulatory referral to Podiatry  . Ambulatory referral to Physical Therapy  . POCT glucose (manual entry)     Requested Prescriptions   Signed Prescriptions Disp Refills  . Dulaglutide (TRULICITY) 9.62 XB/2.8UX SOPN 2 mL 3    Sig: Inject 0.75 mg into the skin once a week.  . metFORMIN (GLUCOPHAGE) 500 MG tablet 30 tablet 3    Sig: Take 2 tablets (1,000 mg total) by mouth daily with breakfast.  . sulfamethoxazole-trimethoprim (BACTRIM DS) 800-160 MG tablet 14 tablet 0    Sig: Take 1 tablet by mouth 2 (two) times daily.  Marland Kitchen lisinopril-hydrochlorothiazide (ZESTORETIC) 10-12.5 MG tablet 90 tablet 3    Sig: Take 1 tablet by mouth daily.    No follow-ups on file.  Karle Plumber, MD, FACP

## 2020-08-10 ENCOUNTER — Encounter: Payer: Self-pay | Admitting: Internal Medicine

## 2020-08-10 ENCOUNTER — Telehealth: Payer: Self-pay

## 2020-08-10 ENCOUNTER — Other Ambulatory Visit: Payer: Self-pay

## 2020-08-10 ENCOUNTER — Other Ambulatory Visit: Payer: Self-pay | Admitting: Internal Medicine

## 2020-08-10 DIAGNOSIS — E08621 Diabetes mellitus due to underlying condition with foot ulcer: Secondary | ICD-10-CM | POA: Insufficient documentation

## 2020-08-10 DIAGNOSIS — E1159 Type 2 diabetes mellitus with other circulatory complications: Secondary | ICD-10-CM

## 2020-08-10 DIAGNOSIS — E1165 Type 2 diabetes mellitus with hyperglycemia: Secondary | ICD-10-CM

## 2020-08-10 DIAGNOSIS — E113299 Type 2 diabetes mellitus with mild nonproliferative diabetic retinopathy without macular edema, unspecified eye: Secondary | ICD-10-CM

## 2020-08-10 DIAGNOSIS — L97421 Non-pressure chronic ulcer of left heel and midfoot limited to breakdown of skin: Secondary | ICD-10-CM | POA: Insufficient documentation

## 2020-08-10 DIAGNOSIS — IMO0002 Reserved for concepts with insufficient information to code with codable children: Secondary | ICD-10-CM

## 2020-08-10 DIAGNOSIS — I152 Hypertension secondary to endocrine disorders: Secondary | ICD-10-CM

## 2020-08-10 DIAGNOSIS — E1121 Type 2 diabetes mellitus with diabetic nephropathy: Secondary | ICD-10-CM | POA: Insufficient documentation

## 2020-08-10 DIAGNOSIS — G8191 Hemiplegia, unspecified affecting right dominant side: Secondary | ICD-10-CM | POA: Insufficient documentation

## 2020-08-10 DIAGNOSIS — E1142 Type 2 diabetes mellitus with diabetic polyneuropathy: Secondary | ICD-10-CM

## 2020-08-10 MED ORDER — LISINOPRIL-HYDROCHLOROTHIAZIDE 10-12.5 MG PO TABS
1.0000 | ORAL_TABLET | Freq: Every day | ORAL | 3 refills | Status: DC
Start: 2020-08-10 — End: 2020-08-10
  Filled 2020-08-10: qty 90, 90d supply, fill #0

## 2020-08-10 MED ORDER — LISINOPRIL-HYDROCHLOROTHIAZIDE 10-12.5 MG PO TABS: 1.0000 | ORAL_TABLET | Freq: Every day | ORAL | 1 refills | Status: DC

## 2020-08-10 MED ORDER — TRULICITY 0.75 MG/0.5ML ~~LOC~~ SOAJ: 0.7500 mg | SUBCUTANEOUS | 3 refills | Status: DC

## 2020-08-10 MED ORDER — PRAVASTATIN SODIUM 20 MG PO TABS: ORAL_TABLET | Freq: Every day | ORAL | 1 refills | Status: DC

## 2020-08-10 MED ORDER — GABAPENTIN 300 MG PO CAPS
300.0000 mg | ORAL_CAPSULE | Freq: Two times a day (BID) | ORAL | 1 refills | Status: DC
Start: 2020-08-10 — End: 2020-12-01

## 2020-08-10 MED ORDER — METFORMIN HCL 500 MG PO TABS: 1000.0000 mg | ORAL_TABLET | Freq: Every day | ORAL | 1 refills | Status: DC

## 2020-08-10 NOTE — Telephone Encounter (Signed)
2nd attempt at calling concerning community resources for the patient.

## 2020-08-11 ENCOUNTER — Other Ambulatory Visit: Payer: Self-pay

## 2020-08-11 ENCOUNTER — Other Ambulatory Visit: Payer: Self-pay | Admitting: Internal Medicine

## 2020-08-11 MED ORDER — DEXCOM G6 RECEIVER DEVI: 1.0000 | Freq: Every day | 0 refills | Status: DC

## 2020-08-11 MED ORDER — DEXCOM G6 TRANSMITTER MISC: 1.0000 | Freq: Every day | 4 refills | Status: DC

## 2020-08-11 MED ORDER — DEXCOM G6 SENSOR MISC: 1.0000 | Freq: Every day | 11 refills | Status: DC

## 2020-08-12 ENCOUNTER — Telehealth: Payer: Self-pay

## 2020-08-12 NOTE — Telephone Encounter (Signed)
Call placed to patient regarding transportation needs. Explained to him that he can use [facility] for his appointments at [facility] associated facilities and he can use [facility] transportation to non [facility] facility appointments. He requested that this information be left on his voicemail and this CM called him back and did that.  [facility] # [phone] [facility] # [phone]  He was not sure of the exact dates of upcoming appointments . Explained to him that Jeris Penta, EMT will be contacting him to schedule a home visit and she will be able to assist with planning his rides if needed.  Also explained to him that he was not accepted for [employer] program.  He is not a [facility] patient.   He then said that he has regained sight in his left eye since the cataract and retinal surgeries.  He is amazed and said that he thinks he has about 90% of his sight back. He has upcoming cataract and retinal surgeries for his right eye

## 2020-08-16 ENCOUNTER — Telehealth: Payer: Self-pay

## 2020-08-16 NOTE — Telephone Encounter (Signed)
(  Call taken while driving)  Returning a phone call from this contact, regarding information for the patient as well. Case Manager from [place] [facility] confirmed they are accepting new patients and all intake for potential clients at this point are done virtually. Case Manager asked to be contacted on the cell number provided.   CM did share with [facility] CM the patients current living situation and recent and upcoming eye surgeries. [facility] CM shared they can start the intake process for the patient but will follow up for improvement on the surgeries.   CM will reach back out of the [facility] CM as well.

## 2020-08-17 ENCOUNTER — Telehealth (HOSPITAL_COMMUNITY): Payer: Self-pay

## 2020-08-17 NOTE — Telephone Encounter (Signed)
Attempted to call Mr. Mielnicki with no success. I will attempt again on Monday when I return to the office.

## 2020-08-18 ENCOUNTER — Other Ambulatory Visit: Payer: Self-pay

## 2020-08-18 ENCOUNTER — Other Ambulatory Visit: Payer: Self-pay | Admitting: Podiatry

## 2020-08-18 ENCOUNTER — Ambulatory Visit (INDEPENDENT_AMBULATORY_CARE_PROVIDER_SITE_OTHER): Payer: Medicaid Other | Admitting: Podiatry

## 2020-08-18 ENCOUNTER — Ambulatory Visit (INDEPENDENT_AMBULATORY_CARE_PROVIDER_SITE_OTHER): Payer: Medicaid Other

## 2020-08-18 ENCOUNTER — Encounter: Payer: Self-pay | Admitting: Podiatry

## 2020-08-18 DIAGNOSIS — L97522 Non-pressure chronic ulcer of other part of left foot with fat layer exposed: Secondary | ICD-10-CM

## 2020-08-18 DIAGNOSIS — S92022P Displaced fracture of anterior process of left calcaneus, subsequent encounter for fracture with malunion: Secondary | ICD-10-CM

## 2020-08-18 DIAGNOSIS — M79672 Pain in left foot: Secondary | ICD-10-CM

## 2020-08-18 DIAGNOSIS — Q6689 Other  specified congenital deformities of feet: Secondary | ICD-10-CM

## 2020-08-18 MED ORDER — MUPIROCIN 2 % EX OINT
1.0000 "application " | TOPICAL_OINTMENT | Freq: Every day | CUTANEOUS | 2 refills | Status: DC
  Filled 2020-08-18: qty 22, 11d supply, fill #0
  Filled 2020-08-26: qty 22, 30d supply, fill #0

## 2020-08-22 NOTE — Progress Notes (Signed)
Subjective:  Patient ID: [name] [name], male    DOB: 08-21-1985,  MRN: [id number]  Chief Complaint  Patient presents with  . Foot Ulcer    Chronic left ankle ulcer. Pt denies fever/chills/nausea/vomiting.     35 y.o. male presents with the above complaint. History confirmed with patient. He recently moved here from [place]. 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 In 2019-21, developed ulcer and OM of hallux and then 2nd toe, underwent first ray resection and 2nd toe amputation. Ambulates in regular shoes. Has a large mass over anterior lateral left ankle with ulcer over that he puts neosporin on.  His most recent A1c is 10  Objective:  Physical Exam: warm, good capillary refill and normal DP and PT pulses. Strength 4-/5 on right side, ulcer submet 5. 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 Ulcer over sinus tarsi with hard bony spur underneath. 1.5cm in diameter, hyperkeratosis around this with fibrogranular base. No signs of infection. No exposed bone. Onychomycosis x8  Radiographs: X-ray of the left foot: partial ray amputation of 1st, 2nd toe amp, HO formation around 2nd and 3rd metatarsals distally, large bony prominence over CCJ, degenerative changes in STJ, midtarsal joint. Appears to be anterior process fractured off     CT 02/29/2020 IMPRESSION: 1. 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 2. 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 Assessment:   1. 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 Calcaneonavicular bar   3. Closed displaced fracture of anterior  process of left calcaneus with malunion, subsequent encounter      Plan:  Patient was evaluated and treated and all questions answered.  Patient educated on diabetes. Discussed proper diabetic foot care and discussed risks and complications of disease. Educated patient in depth on reasons to return to the office immediately should he/she discover anything concerning or new on the feet. All questions answered. Discussed proper shoes as well.   He is at high risk of further limb loss due to active ulceration, profound neuropathy and uncontrolled DM. He needs strict glycemic control in order to heal his wounds, he likely needs surgical intervention for the bony mass  The ulceration it appears to secondary to a large bony process which I believe originated from a previous undiagnosed calcaneonavicular coalition which led to fracture of the anterior process from the remainder of the calcaneus and is still attached to the navicular.  The fragment appears to be stable and relatively unchanged.  I think excision of the entire fragment would destabilize his foot.  Recommend exostectomy of the prominent bone to allow the wound to heal secondarily when his A1c is lower less than 8%.  For now continue local wound care as below  Ulcer left lafter foot -Debridement as below. -Dressed with iodosorb, DSD -Rx mupirocin for home care  Procedure: Excisional Debridement of Wound Rationale: Removal of non-viable soft tissue from the wound to promote healing.  Anesthesia: none Pre-Debridement Wound Measurements: 1.5 cm x 1.5cm x 0.1 cm  Post-Debridement Wound Measurements: same as pre-debridement  Type of Debridement: Sharp selective Tissue Removed: Non-viable soft tissue Depth  of Debridement: subcutaneous tissue. Technique: Sharp selective debridement to bleeding, viable wound base.  Dressing: Dry, sterile, compression dressing. Disposition: Patient tolerated procedure well. Patient to return in 1 week for  follow-up.        Return in about 4 weeks (around 09/15/2020) for wound care.

## 2020-08-24 ENCOUNTER — Ambulatory Visit: Payer: Medicaid Other | Admitting: Pharmacist

## 2020-08-25 ENCOUNTER — Other Ambulatory Visit: Payer: Self-pay

## 2020-08-26 ENCOUNTER — Ambulatory Visit: Payer: Medicaid Other | Admitting: Physical Therapy

## 2020-08-26 ENCOUNTER — Encounter: Payer: Self-pay | Admitting: Physical Therapy

## 2020-08-26 ENCOUNTER — Ambulatory Visit: Payer: Medicaid Other | Admitting: Pharmacist

## 2020-08-26 ENCOUNTER — Other Ambulatory Visit: Payer: Self-pay

## 2020-08-26 VITALS — BP 160/102 | HR 96

## 2020-08-28 NOTE — Therapy (Signed)
[facility] [address] [place], [place], [address] Phone: [phone]   Fax:  [phone]  Patient Details  Name: Keyonne Forsha MRN: [id number] Date of Birth: 1985/08/25 Referring Provider:  Ladell Pier, MD  Evaluation - No Chare   Encounter Date: 08/26/2020 Vitals:   08/26/20 1451 08/26/20 1457  BP: (!) 135/110 (!) 160/102  Pulse: 96    Pt arrived today for PT eval. Assessed pt's BP first automatically and then manually (2nd value). Therapist educating pt on safe BP limits for therapy. Pt asymptomatic and reporting that he did not take his BP medication today and last took it last night. He reports that he takes it 1x per day and is not sure what time to take it and takes it at different times. Pt to see his PCP next week. PT called pt's PCP to make them aware of pt's BP as well as see if they could discuss when pt should be taking his meds at next visit for consistency. Discussed with pt to go home and take his BP medication and if BP stays elevated or if pt has any signs/sx of a CVA to go to the ER. Discussed will re-schedule PT eval until after pt sees his PCP. Pt in agreement with plan.    Arliss Journey, PT, DPT  08/28/2020, 10:01 PM  [facility] [address] [place], [place], [address] Phone: [phone]   Fax:  [phone]

## 2020-08-30 ENCOUNTER — Telehealth: Payer: Self-pay | Admitting: Physical Therapy

## 2020-08-30 ENCOUNTER — Telehealth (HOSPITAL_COMMUNITY): Payer: Self-pay

## 2020-08-30 DIAGNOSIS — G8191 Hemiplegia, unspecified affecting right dominant side: Secondary | ICD-10-CM

## 2020-08-30 NOTE — Telephone Encounter (Signed)
Dr. Wynetta Emery, [name] [name] was seen by Physical Therapy at [facility] outpatient neuro on 08/26/20. Unfortunately the pt was not able to be seen that day for eval due to elevated BP, but has been rescheduled. The patient would benefit from an OT evaluation for R hemiparesis.   If you agree, please place an order in [facility]que in [facility] or fax the order to [phone]. Thank you, [name] [name], PT, DPT 08/30/20 9:37 AM    [facility] [address] [place], [place]  [address] Phone:  [phone] Fax:  [phone]

## 2020-08-30 NOTE — Telephone Encounter (Signed)
Attempted to reach Wilmer Floor for home visit, left voicemail and will continue to reach out for same.

## 2020-08-31 ENCOUNTER — Telehealth (HOSPITAL_COMMUNITY): Payer: Self-pay

## 2020-08-31 ENCOUNTER — Telehealth: Payer: Self-pay

## 2020-08-31 ENCOUNTER — Ambulatory Visit: Payer: Medicaid Other | Admitting: Pharmacist

## 2020-08-31 NOTE — Telephone Encounter (Signed)
Copied from [place] [id number]. Topic: General - Other >> Aug 26, 2020  3:10 PM Mcneil, Ja-Kwan wrote: Reason for CRM: Chloe with [facility] stated they are unable to treat patient due to elevated blood pressure reading of 160/102. Cb# [phone]

## 2020-08-31 NOTE — Telephone Encounter (Signed)
Will forward to provider to follow up with outpatient neuro

## 2020-08-31 NOTE — Telephone Encounter (Signed)
Mr. Cirrito returned my call and agreed to home visit next week. I will call him back and schedule a day and time.

## 2020-09-01 ENCOUNTER — Ambulatory Visit: Payer: Medicaid Other | Admitting: Pharmacist

## 2020-09-01 ENCOUNTER — Telehealth: Payer: Self-pay

## 2020-09-01 ENCOUNTER — Other Ambulatory Visit: Payer: Self-pay
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
## 2020-09-02 ENCOUNTER — Other Ambulatory Visit: Payer: Self-pay

## 2020-09-02 MED ORDER — KETOROLAC TROMETHAMINE 0.5 % OP SOLN
1.0000 [drp] | Freq: Four times a day (QID) | OPHTHALMIC | 1 refills | Status: DC
  Filled 2020-09-02: qty 10, 34d supply, fill #0

## 2020-09-05 ENCOUNTER — Telehealth: Payer: Self-pay

## 2020-09-05 ENCOUNTER — Other Ambulatory Visit: Payer: Self-pay

## 2020-09-05 NOTE — Telephone Encounter (Signed)
[employer] G6 Receiver PA approved until 03/07/2021

## 2020-09-06 ENCOUNTER — Ambulatory Visit: Payer: Medicaid Other | Admitting: Occupational Therapy

## 2020-09-06 ENCOUNTER — Other Ambulatory Visit: Payer: Self-pay

## 2020-09-06 ENCOUNTER — Ambulatory Visit: Payer: Medicaid Other | Attending: Internal Medicine | Admitting: Physical Therapy

## 2020-09-06 ENCOUNTER — Encounter: Payer: Self-pay | Admitting: Physical Therapy

## 2020-09-06 ENCOUNTER — Encounter: Payer: Self-pay | Admitting: Occupational Therapy

## 2020-09-06 VITALS — BP 130/90

## 2020-09-06 DIAGNOSIS — R2681 Unsteadiness on feet: Secondary | ICD-10-CM | POA: Diagnosis present

## 2020-09-06 DIAGNOSIS — M25631 Stiffness of right wrist, not elsewhere classified: Secondary | ICD-10-CM

## 2020-09-06 DIAGNOSIS — G8111 Spastic hemiplegia affecting right dominant side: Secondary | ICD-10-CM | POA: Insufficient documentation

## 2020-09-06 DIAGNOSIS — R2689 Other abnormalities of gait and mobility: Secondary | ICD-10-CM | POA: Diagnosis present

## 2020-09-06 DIAGNOSIS — R41840 Attention and concentration deficit: Secondary | ICD-10-CM | POA: Diagnosis present

## 2020-09-06 DIAGNOSIS — R41842 Visuospatial deficit: Secondary | ICD-10-CM | POA: Insufficient documentation

## 2020-09-06 DIAGNOSIS — R278 Other lack of coordination: Secondary | ICD-10-CM | POA: Diagnosis present

## 2020-09-06 DIAGNOSIS — G8191 Hemiplegia, unspecified affecting right dominant side: Secondary | ICD-10-CM | POA: Diagnosis not present

## 2020-09-06 DIAGNOSIS — M25611 Stiffness of right shoulder, not elsewhere classified: Secondary | ICD-10-CM | POA: Diagnosis present

## 2020-09-06 DIAGNOSIS — M25621 Stiffness of right elbow, not elsewhere classified: Secondary | ICD-10-CM | POA: Diagnosis present

## 2020-09-06 DIAGNOSIS — M6281 Muscle weakness (generalized): Secondary | ICD-10-CM

## 2020-09-06 DIAGNOSIS — R41844 Frontal lobe and executive function deficit: Secondary | ICD-10-CM

## 2020-09-07 ENCOUNTER — Encounter (HOSPITAL_BASED_OUTPATIENT_CLINIC_OR_DEPARTMENT_OTHER): Payer: Medicaid Other | Attending: Internal Medicine | Admitting: Physician Assistant

## 2020-09-07 ENCOUNTER — Telehealth (HOSPITAL_COMMUNITY): Payer: Self-pay

## 2020-09-07 ENCOUNTER — Telehealth: Payer: Self-pay

## 2020-09-07 DIAGNOSIS — G8220 Paraplegia, unspecified: Secondary | ICD-10-CM | POA: Insufficient documentation

## 2020-09-07 DIAGNOSIS — L98499 Non-pressure chronic ulcer of skin of other sites with unspecified severity: Secondary | ICD-10-CM | POA: Insufficient documentation

## 2020-09-07 NOTE — Telephone Encounter (Signed)
Left message for [name] [name] to schedule home visit. I will continue to attempt to call.

## 2020-09-07 NOTE — Progress Notes (Signed)
[name] [name] ([id number]) Visit Report for 09/07/2020 Abuse/Suicide Risk Screen Details Patient Name: Date of Service: [name] [name] MES 09/07/2020 2:45 PM Medical Record Number: [id number] Patient Account Number: [id number] Date of Birth/Sex: Treating RN: 07-Jun-1985 (35 y.o. [name] [name] Primary Care [name] [name]: PA [name] [name], NO Other Clinician: Referring [name] [name]: Treating [name] [name]/Extender: [name] [name] in Treatment: 0 Abuse/Suicide Risk Screen Items Answer ABUSE RISK SCREEN: Has anyone close to you tried to hurt or harm you recentlyo No Do you feel uncomfortable with anyone in your familyo No Has anyone forced you do things that you didnt want to doo No Electronic Signature(s) Signed: 09/07/2020 5:44:30 PM By: Lorrin Jackson Entered By: Lorrin Jackson on 09/07/2020 15:26:18 -------------------------------------------------------------------------------- Activities of Daily Living Details Patient Name: Date of Service: [name] [name] MES 09/07/2020 2:45 PM Medical Record Number: [id number] Patient Account Number: [id number] Date of Birth/Sex: Treating RN: 12/12/1985 (35 y.o. [name] [name] Primary Care Chayah Mckee: PA [name] [name], NO Other Clinician: Referring [name] [name]: Treating [name] [name]/Extender: [name] [name] in Treatment: 0 Activities of Daily Living Items Answer Activities of Daily Living (Please select one for each item) Drive Automobile Completely Able T Medications ake Completely Able Use T elephone Completely Able Care for Appearance Completely Able Use T oilet Completely Able Bath / Shower Completely Able Dress Self Completely Able Feed Self Completely Able Walk Completely Able Get In / Out Bed Completely Able Housework Completely Able Prepare Meals Completely [place] for Self Completely Able Electronic Signature(s) Signed: 09/07/2020 5:44:30 PM By: Lorrin Jackson Entered By: Lorrin Jackson  on 09/07/2020 15:26:40 -------------------------------------------------------------------------------- Education Screening Details Patient Name: Date of Service: [name] [name] MES 09/07/2020 2:45 PM Medical Record Number: [id number] Patient Account Number: [id number] Date of Birth/Sex: Treating RN: [birth year] (35 y.o. [name] [name] Primary Care Keyleen Cerrato: PA [name] [name], NO Other Clinician: Referring [name] [name]: Treating [name] [name]/Extender: [name] [name] in Treatment: 0 Primary Learner Assessed: Patient Learning Preferences/Education Level/Primary Language Learning Preference: Explanation, Demonstration, Printed Material Highest Education Level: High School Preferred Language: English Cognitive Barrier Language Barrier: No Translator Needed: No Memory Deficit: No Emotional Barrier: No Cultural/Religious Beliefs Affecting Medical Care: No Physical Barrier Impaired Vision: Yes Glasses Impaired Hearing: No Decreased Hand dexterity: No Knowledge/Comprehension Knowledge Level: High Comprehension Level: High Ability to understand written instructions: High Ability to understand verbal instructions: High Motivation Anxiety Level: Calm Cooperation: Cooperative Education Importance: Acknowledges Need Interest in Health Problems: Asks Questions Perception: Coherent Willingness to Engage in Self-Management Medium Activities: Readiness to Engage in Self-Management Medium Activities: Electronic Signature(s) Signed: 09/07/2020 5:44:30 PM By: Lorrin Jackson Entered By: Lorrin Jackson on 09/07/2020 15:27:22 -------------------------------------------------------------------------------- Fall Risk Assessment Details Patient Name: Date of Service: [name] [name] MES 09/07/2020 2:45 PM Medical Record Number: [id number] Patient Account Number: [id number] Date of Birth/Sex: Treating RN: 1985-10-25 (35 y.o. [name] [name] Primary Care Marque Rademaker: PA [name] [name], NO Other  Clinician: Referring [name] [name]: Treating [name] [name]/Extender: [name] [name] in Treatment: 0 Fall Risk Assessment Items Have you had 2 or more falls in the last 12 monthso 0 No Have you had any fall that resulted in injury in the last 12 monthso 0 No FALLS RISK SCREEN History of falling - immediate or within 3 months 0 No Secondary diagnosis (Do you have 2 or more medical diagnoseso) 0 No Ambulatory aid None/bed rest/wheelchair/nurse 0 Yes Crutches/cane/walker 0 No Furniture 0 No Intravenous therapy Access/Saline/Heparin Lock 0 No Gait/Transferring Normal/ bed rest/ wheelchair  0 Yes Weak (short steps with or without shuffle, stooped but able to lift head while walking, may seek 0 No support from furniture) Impaired (short steps with shuffle, may have difficulty arising from chair, head down, impaired 0 No balance) Mental Status Oriented to own ability 0 Yes Electronic Signature(s) Signed: 09/07/2020 5:44:30 PM By: Lorrin Jackson Entered By: Lorrin Jackson on 09/07/2020 15:27:42 -------------------------------------------------------------------------------- Foot Assessment Details Patient Name: Date of Service: [name] [name] MES 09/07/2020 2:45 PM Medical Record Number: [id number] Patient Account Number: [id number] Date of Birth/Sex: Treating RN: [birth year] (35 y.o. [name] [name] Primary Care [name] [name]: PA TIENT, NO Other Clinician: Referring [name] [name]: Treating [name] [name]/Extender: [name] [name] in Treatment: 0 Foot Assessment Items Site Locations + = Sensation present, - = Sensation absent, C = Callus, U = Ulcer R = Redness, W = Warmth, M = Maceration, PU = Pre-ulcerative lesion F = Fissure, S = Swelling, D = Dryness Assessment Right: Left: Other Deformity: No No Prior Foot Ulcer: No Yes Prior Amputation: No Yes Charcot Joint: No No Ambulatory Status: Ambulatory Without Help Gait: Steady Electronic Signature(s) Signed:  09/07/2020 5:44:30 PM By: Lorrin Jackson Entered By: Lorrin Jackson on 09/07/2020 15:31:47 -------------------------------------------------------------------------------- Nutrition Risk Screening Details Patient Name: Date of Service: [name] [name] MES 09/07/2020 2:45 PM Medical Record Number: [id number] Patient Account Number: [id number] Date of Birth/Sex: Treating RN: Jun 23, 1985 (35 y.o. [name] [name] Primary Care Sarinity Dicicco: PA [name] [name], NO Other Clinician: Referring Jethro Radke: Treating Enis Riecke/Extender: [name] [name] in Treatment: 0 Height (in): 67 Weight (lbs): 220 Body Mass Index (BMI): 34.5 Nutrition Risk Screening Items Score Screening NUTRITION RISK SCREEN: I have an illness or condition that made me change the kind and/or amount of food I eat 0 No I eat fewer than two meals per day 0 No I eat few fruits and vegetables, or milk products 0 No I have three or more drinks of beer, liquor or wine almost every day 0 No I have tooth or mouth problems that make it hard for me to eat 0 No I don't always have enough money to buy the food I need 0 No I eat alone most of the time 0 No I take three or more different prescribed or over-the-counter drugs a day 1 Yes Without wanting to, I have lost or gained 10 pounds in the last six months 0 No I am not always physically able to shop, cook and/or feed myself 0 No Nutrition Protocols Good Risk Protocol 0 No interventions needed Moderate Risk Protocol High Risk Proctocol Risk Level: Good Risk Score: 1 Electronic Signature(s) Signed: 09/07/2020 5:44:30 PM By: Lorrin Jackson Entered By: Lorrin Jackson on 09/07/2020 15:28:08

## 2020-09-07 NOTE — Therapy (Signed)
[facility] [address] [place], [place], [address] Phone: [phone]   Fax:  [phone]  Occupational Therapy Evaluation  Patient Details  Name: [name] [name] MRN: [id number] Date of Birth: 1985-05-22 Referring Provider (OT): Karle Plumber, MD   Encounter Date: 09/06/2020   OT End of Session - 09/07/20 1022     Visit Number 1    Number of Visits 13    Date for OT Re-Evaluation 11/30/20    Authorization Type [employer]    Authorization Time Period 27 PT/ OT/ ST    Authorization - Number of Visits 12    OT Start Time [id number]    OT Stop Time 1830    OT Time Calculation (min) 45 min    Activity Tolerance Patient tolerated treatment well    Behavior During Therapy [facility] for tasks assessed/performed             Past Medical History:  Diagnosis Date   Arthritis    "neck; maybe in my fingers" (11/06/2016)   Asthma    Bipolar disorder ([place])    "IED: intentional deficit disorder"   Depression    Heart murmur    "when I was a child"   Hypertension    Type II diabetes mellitus ([place])     Past Surgical History:  Procedure Laterality Date   AMPUTATION Left 11/05/2016   Procedure: AMPUTATION LEFT GREAT TOE;  Surgeon: Newt Minion, MD;  Location: [facility];  Service: Orthopedics;  Laterality: Left;   CATARACT EXTRACTION Left 07/2020   DRESSING CHANGE UNDER ANESTHESIA N/A 04/13/2016   Procedure: DRESSING CHANGE UNDER ANESTHESIA;  Surgeon: Johnathan Hausen, MD;  Location: [facility];  Service: General;  Laterality: N/A;   INCISION AND DRAINAGE ABSCESS Right 09/03/2015   Procedure: INCISION AND DRAINAGE ABSCESS;  Surgeon: Roseanne Kaufman, MD;  Location: [facility];  Service: Orthopedics;  Laterality: Right;   INCISION AND DRAINAGE PERIRECTAL ABSCESS N/A 04/11/2016   Procedure: IRRIGATION AND DEBRIDEMENT RECTAL ABSCESS;  Surgeon: Johnathan Hausen, MD;  Location: [facility];  Service: General;  Laterality: N/A;   [place]; 1989   x2    IRRIGATION AND DEBRIDEMENT ABSCESS Left 08/31/2015   Procedure: IRRIGATION AND DEBRIDEMENT ABSCESS LEFT SHOULDER;  Surgeon: Alphonsa Overall, MD;  Location: [facility];  Service: General;  Laterality: Left;   WOUND DEBRIDEMENT N/A 02/24/2016   Procedure: SACRAL WOUND DEBRIDEMENT;  Surgeon: Georganna Skeans, MD;  Location: [facility];  Service: General;  Laterality: N/A;    There were no vitals filed for this visit.   Subjective Assessment - 09/07/20 1316     Subjective  Pt is a 35 year old that presents to Neuro OPOT with right hemiplegia. Pt moved to [place] about a year ago and is currently living in a boarding home with 5 bedrooms with his own space for increased independent living. Pt reports primary deficit and goal to work on [place]. Pt reports he has been driving and has been cleared by the MD based on his vision.    Pertinent History DM, HTN, R sided paralysis, visual deficits (cataracts, retinal damange)    Limitations Fall Risk, Visual deficits    Patient Stated Goals "better use out of this (right) hand. I was right handed before"    Currently in Pain? No/denies               [facility] OT Assessment - 09/06/20 1753       Assessment   Medical  Diagnosis Hemiparesis of R dominant side    Referring Provider (OT) Karle Plumber, MD    Hand Dominance Right    Prior Therapy [facility] and rehab after tumor removal      Precautions   Precautions Fall    Required Braces or Orthoses Other Brace/Splint    Other Brace/Splint AFO   but not wearing at eval     Balance Screen   Has the patient fallen in the past 6 months No      Home  Environment   Family/patient expects to be discharged to: Group home   "boarding house"   Living Arrangements Alone    Available Help at Discharge Home health   trying to get [employer]igma Access Stairs   w railing - about [place] to live on main level with bedroom/bathroom    Bathroom Shower/Tub Tub/Shower unit     [place] - 2 wheels   hemi walker     Prior Function   Level of Independence Independent with basic ADLs    Vocation On disability    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    Leisure learning - history channel      ADL   Eating/Feeding Modified independent    Grooming Modified independent    Upper Body Bathing Modified independent    Lower Body Bathing Modified independent    Upper Body Dressing Independent    Lower Body Dressing Modified independent   cannot tie shoes - leaves them tying   Toilet Transfer Modified independent    Toileting - Clothing Manipulation Modified independent    [place] Transfer Supervision/safety   does not currently have supervision but reports being scared and worried about this     IADL   Prior Level of Function Shopping independent    Shopping Takes care of all shopping needs independently   uses scooter - but reports challenge with getting things out of the freezers and fridge   Light Housekeeping Performs light daily tasks but cannot maintain acceptable level of cleanliness   reports diff with scrubbing tub, floors, etc. challnege with dishes, and making bed   Meal Prep Able to complete simple warm meal prep   reports difficulty with carrying heavier items (i.e. casserole dish)   Community Mobility Drives own vehicle   pt reports he has been cleared by MD for driving after looking at his vision.   Medication Management Is responsible for taking medication in correct dosages at correct time    Financial Management Manages financial matters independently (budgets, writes checks, pays rent, bills goes to bank), collects and keeps track of income      Written Expression   Dominant Hand Right      Vision - History   Baseline Vision Wears glasses only for reading    Visual History Other (comment)   cataracts, retinal surgery L eye     Vision Assessment    Comment continue to assess in functioanl context      Cognition   Overall Cognitive Status No family/caregiver present to determine baseline cognitive functioning    Cognition Comments cognitive deficits noted at evaluation but unclear what baseline was. Will continue to assess      Observation/Other Assessments   Focus on Therapeutic Outcomes (FOTO)  N/A      Sensation  Light Touch Impaired by gross assessment    Stereognosis Impaired by gross assessment    Hot/Cold Appears Intact   reports intact     Coordination   Gross Motor Movements are Fluid and Coordinated No    Fine Motor Movements are Fluid and Coordinated No    Box and Blocks R 16 L 53      ROM / Strength   AROM / PROM / Strength AROM;Strength      AROM   Overall AROM Comments RUE Deficits; LUE WFL    AROM Assessment Site Shoulder;Elbow;Forearm;Wrist    Right/Left Shoulder Right    Right Shoulder Flexion 105 Degrees    Right/Left Elbow Right    Right Elbow Extension -15    Right/Left Forearm Right    Right Forearm Supination --   not measured but deficits noted.   Right/Left Wrist Right    Right Wrist Extension -20 Degrees    Right Wrist Flexion 75 Degrees   @ rest wrist flex 45*     Strength   Strength Assessment Site Shoulder    Right/Left Shoulder Right   LUE WFL   Right Shoulder Flexion 3+/5    Right Shoulder Extension 3+/5    Right Shoulder ABduction 3+/5      Hand Function   Right Hand Gross Grasp Impaired    Right Hand Grip (lbs) 10.5    Left Hand Gross Grasp Functional    Left Hand Grip (lbs) 84.8                               OT Short Term Goals - 09/07/20 1322       OT SHORT TERM GOAL #1   Title Pt will be independent with HEP for ROM    Baseline Not issued at eval    Time 4    Period Weeks    Status New    Target Date 10/05/20      OT SHORT TERM GOAL #2   Title Pt will be independent with any braces and/or splints wear and care PRN.    Baseline not issued at  eval    Time 4    Period Weeks    Status New      OT SHORT TERM GOAL #3   Title Pt will score 20 or greater on box and blocks with RUE for increase in functional use of RUE    Baseline R 16, L 53    Time 4    Period Weeks    Status New      OT SHORT TERM GOAL #4   Title Pt will perform simple warm meal prep and/or light housekeeping with supervision and good safety awareness    Baseline some diff with manipulating items    Time 4    Period Weeks    Status New      OT SHORT TERM GOAL #5   Title Pt will demonstrate and/or report increased safety and stability with tub transfers for increasing safety with ADLs.    Baseline reports safety concerns with home set up/transfers    Time 4    Period Weeks    Status New               OT Long Term Goals - 09/07/20 1332       OT LONG TERM GOAL #1   Title Pt will be independent with updated HEP    Baseline  not issued at eval    Time 12    Period Weeks    Status New    Target Date 11/30/20      OT LONG TERM GOAL #2   Title Pt will demonstrate active range of motion in RUE shoulder flexion of 120 degrees for increasing ability to reach and obtain lightweight object from shelf.    Baseline 105* RUE shoulder flex    Time 12    Period Weeks    Status New      OT LONG TERM GOAL #3   Title Pt will demonstrate improved wrist extension to neutral for increasing ability to reach and obtain an object.    Baseline -20 degrees    Time 12    Period Weeks    Status New      OT LONG TERM GOAL #4   Title Pt will increase grip strength in RUE to 15 lbs or greater for increase in functional use of RUE    Baseline R 10.5, L 84.8    Time 12    Period Weeks    Status New      OT LONG TERM GOAL #5   Title Pt will use RUE for functional stabilizer and assists for daily activities 50% of the time or greater.    Baseline using but not consistently.    Time 12    Period Weeks    Status New                   Plan - 09/06/20  1823     Clinical Impression Statement Pt is a 35 year old male that presents to Neuro OPOT with right hemiparesis. Pt is right hand dominant. PMH of DM, HTN, right sided paralysis and visual deficits (cataracts, retinal damage). Pt presents with decreased strength, range of motion and coordination in RUE, deficits with congition and visual deficits and decreased indepenence with ADLs and IADLs. Skilled occupational therapy is recommended to target listed areas of deficit and increase independence with ADLs and IADLs.    OT Occupational Profile and History Problem Focused Assessment - Including review of records relating to presenting problem    Occupational performance deficits (Please refer to evaluation for details): IADL's;ADL's;Social Participation;Work    Marketing executive / Function / Physical Skills Vision;UE functional use;Tone;Strength;GMC;IADL;ROM;FMC;Decreased knowledge of precautions;Coordination;Flexibility;Proprioception;Dexterity;Decreased knowledge of use of DME;ADL    Cognitive Skills Memory;Understand;Sequencing;Safety Awareness;Attention;Problem Solve    Rehab Potential Good    Clinical Decision Making Limited treatment options, no task modification necessary    Comorbidities Affecting Occupational Performance: None    Modification or Assistance to Complete Evaluation  No modification of tasks or assist necessary to complete eval    OT Frequency Other (comment)   2x/week for 3 weeks, 1x/week for 6 weeks for 12 total visits   OT Duration Other (comment)   2x/3 weeks, 1x/6 weeks - 12 visits over 10 weeks   OT Treatment/Interventions Self-care/ADL training;Splinting;DME and/or AE instruction;Patient/family education;Therapeutic exercise;Functional Mobility Training;Neuromuscular education;Passive range of motion;Manual Therapy;Electrical Stimulation;Therapeutic activities;Aquatic Therapy;Cognitive remediation/compensation;Visual/perceptual remediation/compensation    Plan supine shoulder  HEP, look closer at vision    Consulted and Agree with Plan of Care Patient             Patient will benefit from skilled therapeutic intervention in order to improve the following deficits and impairments:   Body Structure / Function / Physical Skills: Vision, UE functional use, Tone, Strength, GMC, IADL, ROM, FMC, Decreased knowledge of precautions, Coordination, Flexibility,  Proprioception, Dexterity, Decreased knowledge of use of DME, ADL Cognitive Skills: Memory, Understand, Sequencing, Safety Awareness, Attention, Problem Solve     Visit Diagnosis: Spastic hemiplegia affecting right dominant side, unspecified etiology ([place])  Unsteadiness on feet  Other lack of coordination  Visuospatial deficit  Attention and concentration deficit  Frontal lobe and executive function deficit  Stiffness of right wrist, not elsewhere classified  Stiffness of right elbow, not elsewhere classified  Stiffness of right shoulder, not elsewhere classified    [employer] Authorization   Choose one: Neuro Rehabilitative  Standardized Assessment or Functional Outcome Tool: See Pain Assessment and N/A  Body Parts Treated (Select each separately):  Shoulder. Overall deficits/functional limitations for body part selected: moderate Elbow. Overall deficits/functional limitations for body part selected: moderate Hand/Wrist. 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[place]) 08/10/2020   Hemiparesis of right dominant side due to non-cerebrovascular etiology ([place]) 08/10/2020   Diabetic ulcer of left midfoot associated with diabetes mellitus due to underlying condition, limited to breakdown of skin ([place]) 08/10/2020   Cellulitis, leg 03/17/2020   Cellulitis of right leg 03/17/2020   Uncontrolled type 2 diabetes mellitus with complication, with long-term current use  of insulin ([place])    Abscess    Cellulitis 12/05/2019   Cellulitis of left lower extremity    Abscess of left lower extremity    Uncontrolled type 2 diabetes mellitus with hyperglycemia ([facility])    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 2 diabetes mellitus ([place])    Cellulitis of foot, left 11/22/2016   Diabetic neuropathy ([place]) 11/22/2016   Fracture of 2nd metatarsal 11/22/2016   Fracture of 3rd metatarsal 11/22/2016   Postprocedural non-healing wound 11/22/2016   Acute cystitis with positive culture    Cutaneous abscess of left foot    Osteomyelitis ([place]) 11/03/2016   Poorly controlled type 2 diabetes mellitus with complication ([place]) [id number]   Hyperlipidemia 10/17/2016   Depression 10/17/2016   Neck pain    Asthma 04/11/2016   Uncontrolled type 2 diabetes mellitus with complication ([place])    Obesity, morbid ([place]) 09/06/2015   Hypertension associated with diabetes ([place]) 09/06/2015    Zachery Conch MOT, OTR/L  09/07/2020, 1:40 PM  [facility] [address] [place], [place], [address] Phone: [phone]   Fax:  [phone]  Name: [name] [name] MRN: [id number] Date of Birth: [birth year]

## 2020-09-07 NOTE — Therapy (Addendum)
[facility] [address] [place], [place], [address] Phone: [phone]   Fax:  [phone]  Physical Therapy Evaluation  Patient Details  Name: [name] [name] MRN: [id number] Date of Birth: [birth year] Referring Provider (PT): Ladell Pier, MD   Encounter Date: 09/06/2020   PT End of Session - 09/07/20 2049     Visit Number 1    Number of Visits 13    Date for PT Re-Evaluation 11/30/20    Authorization Type [employer]    PT Start Time [id number]    PT Stop Time 1700    PT Time Calculation (min) 45 min    Equipment Utilized During Treatment Gait belt    Activity Tolerance Patient tolerated treatment well    Behavior During Therapy [facility] for tasks assessed/performed             Past Medical History:  Diagnosis Date   Arthritis    "neck; maybe in my fingers" (11/06/2016)   Asthma    Bipolar disorder ([place])    "IED: intentional deficit disorder"   Depression    Heart murmur    "when I was a child"   Hypertension    Type II diabetes mellitus ([place])     Past Surgical History:  Procedure Laterality Date   AMPUTATION Left 11/05/2016   Procedure: AMPUTATION LEFT GREAT TOE;  Surgeon: Newt Minion, MD;  Location: [facility];  Service: Orthopedics;  Laterality: Left;   CATARACT EXTRACTION Left 07/2020   DRESSING CHANGE UNDER ANESTHESIA N/A 04/13/2016   Procedure: DRESSING CHANGE UNDER ANESTHESIA;  Surgeon: Johnathan Hausen, MD;  Location: [facility];  Service: General;  Laterality: N/A;   INCISION AND DRAINAGE ABSCESS Right 09/03/2015   Procedure: INCISION AND DRAINAGE ABSCESS;  Surgeon: Roseanne Kaufman, MD;  Location: [facility];  Service: Orthopedics;  Laterality: Right;   INCISION AND DRAINAGE PERIRECTAL ABSCESS N/A 04/11/2016   Procedure: IRRIGATION AND DEBRIDEMENT RECTAL ABSCESS;  Surgeon: Johnathan Hausen, MD;  Location: [facility];  Service: General;  Laterality: N/A;   [place]; 1989   x2    IRRIGATION AND  DEBRIDEMENT ABSCESS Left 08/31/2015   Procedure: IRRIGATION AND DEBRIDEMENT ABSCESS LEFT SHOULDER;  Surgeon: Alphonsa Overall, MD;  Location: [facility];  Service: General;  Laterality: Left;   WOUND DEBRIDEMENT N/A 02/24/2016   Procedure: SACRAL WOUND DEBRIDEMENT;  Surgeon: Georganna Skeans, MD;  Location: [facility];  Service: General;  Laterality: N/A;    Vitals:   09/06/20 1620  BP: 130/90      Subjective Assessment - 09/06/20 1623     Subjective Has been taking his BP medication more consistently, has follow up with PCP in July (had to have his appt rescheduled). 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 Had a couple months of inpatient therapy and a month of outpatient therapy. Had an electric wheelchair that broke about a year ago. Reports the only way he can get it fixed is if he brings it back up to the company in [place] (where it came from). Reports he had to teach himself how to walk. No falls. Does not use any device for gait. Used to use a hemiwalker, but does not use it.    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 around this building 1x (approx. [place]    Patient Stated Goals wants to build up his strength on his R side.    Currently in Pain? No/denies                [facility] PT Assessment - 09/06/20 1629       Assessment   Medical Diagnosis R hemiparesis    Referring Provider (PT) Ladell Pier, MD    Onset Date/Surgical Date 08/10/20    Hand Dominance Right   now having to teach himself how to use his L hand   Prior Therapy previous PT after removal of cervical spinal tumor in 2020      Precautions   Precautions Fall    Required Braces or Orthoses Other  Brace/Splint    Other Brace/Splint AFO - not wearing at eval, has one at home      Balance Screen   Has the patient fallen in the past 6 months No    Has the patient had a decrease in activity level because of a fear of falling?  No   "not falling, just afraid of getting too tired"   Is the patient reluctant to leave their home because of a fear of falling?  No   more afraid of alone and leaving the [place] Other (Comment)   boarding house - 5 bedroom [place] Other (Comment)   5 bedroom house, but stays in his room   Available Help at Discharge Friend(s)    [place] to enter    [place] of Steps 10-13    Entrance Stairs-Rails Can reach both    [place] Other (comment);Wheelchair - power   reports he needs a shower chair, uses hemiwalker to get in and out of the shower, has power w/c but it does not work   Additional Comments friends help with chores, cooking. 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    Leisure enjoys Immunologist, Water engineer Movements are Fluid and Coordinated No    Heel Shin Test impaired with RLE due to hemiparesis      ROM / Strength   AROM / PROM / Strength Strength;AROM      AROM   Overall AROM Comments decr R ankle DF/knee extension due to strength      Strength   Strength Assessment Site Hip;Knee;Ankle    Right/Left Hip Left;Right    Right Hip Flexion 4/5    Left Hip Flexion 5/5    Right/Left Knee Right;Left    Right Knee Flexion 4-/5    Right Knee Extension 4/5    Left Knee Flexion 5/5    Left Knee Extension 5/5    Right/Left Ankle Right;Left    Right Ankle Dorsiflexion 4/5    Left  Ankle Dorsiflexion 3-/5      Transfers   Transfers Sit to Stand;Stand to Sit    Sit to Stand 5: Supervision;With upper extremity assist    Five time sit to stand comments  16.66 seconds using LUE to help stand, heavily reliant on LLE to stand    Stand to Sit 5: Supervision;With upper extremity assist      Ambulation/Gait   Ambulation/Gait Yes    Ambulation/Gait Assistance 5: Supervision;4: Min guard    Ambulation Distance (Feet) --   clinic distances   Assistive device None    Gait Pattern Decreased arm swing - right;Decreased arm swing - left;Decreased stance time - right;Decreased hip/knee flexion - right;Decreased dorsiflexion - right;Right circumduction;Decreased weight shift to right;Right hip hike;Poor foot clearance - right;Wide base of support    Ambulation Surface Level;Indoor    Gait velocity 15.53 seconds = 2.11 ft/sec    Stairs Yes    Stairs Assistance 4: Min guard    Stair Management Technique One rail Left;Step to pattern;Forwards   heavily reliant on railing   Number of Stairs 4    Height of Stairs 6    Gait Comments pt reports having an AFO at home, but does not use it, will bring it in next time      Standardized Balance Assessment   Standardized Balance Assessment Timed Up and Go Test      Timed Up and Go Test   Normal TUG (seconds) 17.13    TUG Comments no AD               Managed medicaid CPT codes: D000499- Therapeutic Exercise, [phone]- Neuro Re-education, [phone] - Gait Training, [phone] - Manual Therapy, 97530 - Therapeutic Activities, 97535 - Self Care, and [phone] - Orthotic Fit          Objective measurements completed on examination: See above findings.               PT Education - 09/07/20 2048     Education Details clinical findings, POC, medicaid visit limit, importance of taking BP medications as prescribed daily.    Person(s) Educated Patient    Methods Explanation    Comprehension Verbalized understanding                PT Short Term Goals - 09/08/20 1053       PT SHORT TERM GOAL #1   Title Pt will be independent with initial HEP in order to build upon functional gains made in therapy. ALL STGS DUE 10/06/20    Baseline currently dependent    Time 4    Period Weeks    Status New    Target Date 10/06/20      PT SHORT TERM GOAL #2   Title Pt will undergo further assessment of BERG with LTG to be written as appropriate to decr fall risk.    Baseline not yet assessed.    Time 4    Period Weeks    Status New      PT SHORT TERM GOAL #3   Title Pt will decr TUG time with no AD to 15 seconds or less in order to demo decr fall risk.    Baseline 17.13 seconds    Time 4    Period Weeks    Status New      PT SHORT TERM GOAL #4   Title Pt will ambulate at least 67' with no AD and R AFO with supervision over level surfaces in order to demo improved household mobility.    Baseline supervision/min guard with no AD and no AFO    Time 4    Period Weeks    Status New      PT  SHORT TERM GOAL #5   Title Pt will improve gait speed to at least 2.4 ft/sec with no AD vs. LRAD and R AFO in order to demo improved community mobility.    Baseline 2.11 ft/sec    Time 4    Period Weeks    Status New             PT Long Term Goals - 09/08/20 1058       PT LONG TERM GOAL #1   Title Pt will be independent with final HEP in order to build upon functional gains made in therapy. ALL LTGS DUE 11/10/20    Baseline currently dependent    Time 9    Period Weeks    Status New    Target Date 11/10/20      PT LONG TERM GOAL #2   Title BERG goal to be written as appropriate in order to demo decr fall risk.    Baseline not yet assessed.    Time 9    Period Weeks    Status New      PT LONG TERM GOAL #3   Title Pt will ambulate at least 69' with no AD vs. LRAD and R AFO with supervision over unlevel paved surfaces in order to demo improved community mobility.    Baseline not yet assessed outdoors.    Time 9     Period Weeks    Status New      PT LONG TERM GOAL #4   Title Pt will perform 8 steps with single handrail with step to pattern with supervision in order to safely get in and out of the house.    Baseline 4 steps with heavily reliant on handrail and min guard.    Time 9    Period Weeks    Status New      PT LONG TERM GOAL #5   Title Pt will improve gait speed to at least 2.7 ft/sec with no AD vs. LRAD and R AFO in order to demo improved community mobility.    Baseline 2.11 ft/sec    Time 9    Period Weeks    Status New                  09/08/20 1048  Plan  Clinical Impression Statement Patient is a 35 year old male referred to Neuro OPPT for R hemiparesis.   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 The following deficits were present during the exam: impaired coordination, decr strength, decr ROM, impaired tone, balance impairments, decr activity tolerance, gait abnormalities . Based on TUG and 5x sit <> stand, pt is an incr risk for falls. With gait speed with no AD, pt is a limited community ambulator. 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 in [place]  Examination-Activity Limitations Transfers;Bathing;Squat;Stairs;Locomotion Level   Examination-Participation Restrictions Cleaning;Community Activity;Medication Management;Laundry;Meal Prep  Pt will benefit from skilled therapeutic intervention in order to improve on the following deficits Abnormal gait;Decreased activity tolerance;Decreased coordination;Decreased balance;Decreased endurance;Decreased safety awareness;Decreased range of motion;Difficulty walking;Decreased strength;Impaired tone;Impaired UE functional use  Stability/Clinical Decision Making Evolving/Moderate complexity  Clinical Decision Making Moderate  Rehab Potential Good  PT Frequency Other (comment) (2x/3 weeks, 1x/6 weeks - 12 visits over 10 weeks)  PT Duration Other (comment) (2x/3 weeks, 1x/6 weeks - 12 visits over 10 weeks)  PT Treatment/Interventions ADLs/Self Care Home Management;DME Instruction;Therapeutic activities;Functional mobility training;Stair training;Gait training;Therapeutic exercise;Balance training;Neuromuscular re-education;Orthotic Fit/Training;Patient/family education;Manual techniques;Vestibular  PT Next Visit Plan monitor BP. perform BERG and write goal. initial HEP for balance and RLE strength/ROM. pt to bring in his AFO from home.  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[place]) 08/10/2020   Hemiparesis of right dominant side due to non-cerebrovascular etiology ([place]) 08/10/2020   Diabetic ulcer of left midfoot associated with diabetes mellitus due to underlying condition, limited to breakdown of skin ([place]) 08/10/2020   Cellulitis, leg  03/17/2020   Cellulitis of right leg 03/17/2020   Uncontrolled type 2 diabetes mellitus with complication, with long-term current use of insulin ([place])    Abscess    Cellulitis 12/05/2019   Cellulitis of left lower extremity    Abscess of left lower extremity    Uncontrolled type 2 diabetes mellitus with hyperglycemia ([facility])    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 2 diabetes mellitus ([place])    Cellulitis of foot, left 11/22/2016   Diabetic neuropathy ([place]) 11/22/2016   Fracture of 2nd metatarsal 11/22/2016   Fracture of 3rd metatarsal 11/22/2016   Postprocedural non-healing wound 11/22/2016   Acute cystitis with positive culture    Cutaneous abscess of left foot    Osteomyelitis ([place]) 11/03/2016   Poorly controlled type 2 diabetes mellitus with complication ([place]) [id number]   Hyperlipidemia 10/17/2016   Depression 10/17/2016   Neck pain    Asthma 04/11/2016   Uncontrolled type 2 diabetes mellitus with complication ([place])    Obesity, morbid ([place]) 09/06/2015   Hypertension associated with diabetes ([place]) 09/06/2015    Arliss Journey, PT, DPT 09/07/2020, 8:50 PM  [facility] [address] [place], [place], [address] Phone: [phone]   Fax:  [phone]  Name: [name] [name] MRN: [id number] Date of Birth: Sep 21, 1985

## 2020-09-07 NOTE — Telephone Encounter (Signed)
Call placed to [facility] - care management [phone] to check on status of PCS referral. Spoke to [place] and then [employer].  The referral has been received and [place] explained that she called him 5/31, 6/2 and 08/19/2020 and has not been able to reach him.  Messages have been left but he has not returned the calls.  Provided her with his current phone number # [phone] and she said she would reach out to him again

## 2020-09-07 NOTE — Progress Notes (Addendum)
[name] [name] ([id number]) Visit Report for 09/07/2020 Allergy List Details Patient Name: Date of Service: [name] [name] MES 09/07/2020 2:45 PM Medical Record Number: [id number] Patient Account Number: [id number] Date of Birth/Sex: Treating RN: December 31, 1985 (35 y.o. [name] [name] Primary Care [name] [name]: PA [name] [name], NO Other Clinician: Referring Ka Flammer: Treating [name] [name]/Extender: [name] [name] in Treatment: 0 Allergies Active Allergies No Known Allergies Allergy Notes Electronic Signature(s) Signed: 09/07/2020 5:44:30 PM By: [name] [name] Entered By: [name] [name] on 09/07/2020 15:18:45 -------------------------------------------------------------------------------- Arrival Information Details Patient Name: Date of Service: [name] [name] MES 09/07/2020 2:45 PM Medical Record Number: [id number] Patient Account Number: [id number] Date of Birth/Sex: Treating RN: June 18, 1985 (35 y.o. [name] [name] Primary Care Tayllor Breitenstein: PA [name] [name], NO Other Clinician: Referring Llewelyn Sheaffer: Treating [name] [name]/Extender: [name] [name] in Treatment: 0 Visit Information Patient Arrived: Ambulatory Arrival Time: 15:15 Transfer Assistance: None Patient Identification Verified: Yes Secondary Verification Process Completed: Yes Patient Requires Transmission-Based Precautions: No Patient Has Alerts: No Electronic Signature(s) Signed: 09/07/2020 5:44:30 PM By: [name] [name] Entered By: [name] [name] on 09/07/2020 15:16:07 -------------------------------------------------------------------------------- Encounter Discharge Information Details Patient Name: Date of Service: [name] [name] MES 09/07/2020 2:45 PM Medical Record Number: [id number] Patient Account Number: [id number] Date of Birth/Sex: Treating RN: [birth year] (35 y.o. [name] [name] Primary Care [name] [name]: Other Clinician: PA [name] [name] Referring Katalyna Socarras: Treating Takiyah Bohnsack/Extender: [name] [name] in Treatment: 0 Encounter Discharge Information Items Post Procedure Vitals Discharge Condition: Stable Temperature (F): 98.3 Ambulatory Status: Ambulatory Pulse (bpm): 93 Discharge Destination: Home Respiratory Rate (breaths/min): 18 Transportation: Private Auto Blood Pressure (mmHg): 122/76 Accompanied By: self Schedule Follow-up Appointment: Yes Clinical Summary of Care: Patient Declined Electronic Signature(s) Signed: 09/07/2020 5:44:00 PM By: [name] Gouty RN, BSN Entered By: [name] [name] on 09/07/2020 17:01:18 -------------------------------------------------------------------------------- Lower Extremity Assessment Details Patient Name: Date of Service: [name] [name] MES 09/07/2020 2:45 PM Medical Record Number: [id number] Patient Account Number: [id number] Date of Birth/Sex: Treating RN: June 03, 1985 (35 y.o. [name] [name] Primary Care [name] [name]: PA [name] [name], NO Other Clinician: Referring [name] [name]: Treating [name] [name]/Extender: [name] [name] in Treatment: 0 Edema Assessment Assessed: [Left: Yes] [Right: No] Edema: [Left: N] [Right: o] Calf Left: Right: Point of Measurement: 28 cm From Medial Instep 36.6 cm Ankle Left: Right: Point of Measurement: 10 cm From Medial Instep 23.5 cm Vascular Assessment Pulses: Dorsalis Pedis Palpable: [Left:Yes] Doppler Audible: [Left:Yes] Blood Pressure: Brachial: [Left:122] Ankle: [Left:Dorsalis Pedis: 152 1.25] Electronic Signature(s) Signed: 09/07/2020 5:44:30 PM By: [name] [name] Entered By: [name] [name] on 09/07/2020 15:42:16 -------------------------------------------------------------------------------- Multi-Disciplinary Care Plan Details Patient Name: Date of Service: [name] [name] MES 09/07/2020 2:45 PM Medical Record Number: [id number] Patient Account Number: [id number] Date of Birth/Sex: Treating RN: Apr 17, 1985 (35 y.o. [name] [name] Primary Care  [name] [name]: PA [name] [name], [place] Other Clinician: Referring [name] [name]: Treating [name] [name]/Extender: [name] [name] in Treatment: [place] reviewed with physician Active Inactive Electronic Signature(s) Signed: 12/15/2020 3:07:54 PM By: [name] Gouty RN, BSN Previous Signature: 09/07/2020 5:44:00 PM Version By: [name] Gouty RN, BSN Entered By: [name] [name] on 11/10/2020 09:45:37 -------------------------------------------------------------------------------- Pain Assessment Details Patient Name: Date of Service: [name] [name] MES 09/07/2020 2:45 PM Medical Record Number: [id number] Patient Account Number: [id number] Date of Birth/Sex: Treating RN: September 18, 1985 (35 y.o. [name] [name] Primary Care [name] [name]: PA [name] [name], NO Other Clinician: Referring [name] [name]: Treating [name] [name]/Extender: [name] [name] in Treatment: 0 Active Problems Location of  Pain Severity and Description of Pain Patient Has Paino No Site Locations Pain Management and Medication Current Pain Management: Electronic Signature(s) Signed: 09/07/2020 5:44:30 PM By: [name] [name] Entered By: [name] [name] on 09/07/2020 15:30:33 -------------------------------------------------------------------------------- Patient/Caregiver Education Details Patient Name: Date of Service: [name] [name] MES 6/22/2022andnbsp2:45 PM Medical Record Number: [id number] Patient Account Number: [id number] Date of Birth/Gender: Treating RN: 1985-05-31 (35 y.o. [name] [name] Primary Care Physician: PA [name] [name], [place] Other Clinician: Referring Physician: Treating Physician/Extender: [name] [name] in Treatment: 0 Education Assessment Education Provided To: Patient Education Topics Provided Elevated Blood Sugar/ Impact on Healing: Handouts: Elevated Blood Sugars: How Do They Affect Wound Healing Methods: Explain/Verbal, Printed Responses:  Reinforcements needed, State content correctly [place]: o Handouts: Welcome T [facility] [place] o Methods: Explain/Verbal, Printed Responses: Reinforcements needed, State content correctly Wound/Skin Impairment: Handouts: Caring for Your Ulcer, Skin Care Do's and Dont's Methods: Explain/Verbal Responses: Reinforcements needed, State content correctly Electronic Signature(s) Signed: 09/07/2020 5:44:00 PM By: [name] Gouty RN, BSN Entered By: [name] [name] on 09/07/2020 16:19:39 -------------------------------------------------------------------------------- Wound Assessment Details Patient Name: Date of Service: [name] [name] MES 09/07/2020 2:45 PM Medical Record Number: [id number] Patient Account Number: [id number] Date of Birth/Sex: Treating RN: 1985/07/29 (35 y.o. [name] [name] Primary Care Ananias Kolander: PA [name] [name], NO Other Clinician: Referring Arelys Glassco: Treating Dustin Bumbaugh/Extender: [name] [name] in Treatment: 0 Wound Status Wound Number: 1 Primary Diabetic Wound/Ulcer of the Lower Extremity Etiology: Wound Location: Left, Lateral Foot Wound Open Wounding Event: Gradually Appeared Status: Date Acquired: 05/21/2017 Comorbid Cataracts, Hypertension, Type II Diabetes, Osteomyelitis, Weeks Of Treatment: 0 History: Neuropathy, Paraplegia Clustered Wound: No Photos Wound Measurements Length: (cm) 1.7 Width: (cm) 1.7 Depth: (cm) 0.3 Area: (cm) 2.27 Volume: (cm) 0.681 % Reduction in Area: 0% % Reduction in Volume: 0% Epithelialization: None Tunneling: No Undermining: No Wound Description Classification: Grade 1 Wound Margin: Well defined, not attached Exudate Amount: Medium Exudate Type: Serosanguineous Exudate Color: red, brown Foul Odor After Cleansing: No Slough/Fibrino No Wound Bed Granulation Amount: Large (67-100%) Exposed Structure Granulation Quality: Red Fascia Exposed: No Necrotic Amount: Small  (1-33%) Fat Layer (Subcutaneous Tissue) Exposed: Yes Necrotic Quality: Adherent Slough Tendon Exposed: No Muscle Exposed: No Joint Exposed: No Bone Exposed: No Electronic Signature(s) Signed: 09/07/2020 4:31:55 PM By: Sandre Kitty Signed: 09/07/2020 5:44:30 PM By: [name] [name] Entered By: Sandre Kitty on 09/07/2020 16:06:01 -------------------------------------------------------------------------------- Vitals Details Patient Name: Date of Service: [name] [name] MES 09/07/2020 2:45 PM Medical Record Number: [id number] Patient Account Number: [id number] Date of Birth/Sex: Treating RN: 11-30-1985 (35 y.o. [name] [name] Primary Care Akshay Spang: PA [name] [name], NO Other Clinician: Referring Moise Friday: Treating Tori Dattilio/Extender: [name] [name] in Treatment: 0 Vital Signs Time Taken: 15:16 Temperature (F): 98.3 Height (in): 67 Pulse (bpm): 93 Source: Stated Respiratory Rate (breaths/min): 18 Weight (lbs): 220 Blood Pressure (mmHg): 122/76 Source: Stated Reference Range: 80 - 120 mg / dl Body Mass Index (BMI): 34.5 Electronic Signature(s) Signed: 09/07/2020 5:44:30 PM By: [name] [name] Entered By: [name] [name] on 09/07/2020 15:18:14

## 2020-09-08 ENCOUNTER — Other Ambulatory Visit (HOSPITAL_COMMUNITY): Payer: Self-pay | Admitting: Physician Assistant

## 2020-09-08 ENCOUNTER — Other Ambulatory Visit: Payer: Self-pay | Admitting: Physician Assistant

## 2020-09-08 ENCOUNTER — Other Ambulatory Visit: Payer: Self-pay

## 2020-09-08 DIAGNOSIS — L97522 Non-pressure chronic ulcer of other part of left foot with fat layer exposed: Secondary | ICD-10-CM

## 2020-09-08 NOTE — Progress Notes (Signed)
Paramedicine Encounter    Patient ID: [name] [name], male    DOB: Aug 24, 1985, 35 y.o.   MRN: [id number]   Patient Care Team: Patient, No Pcp Per (Inactive) as PCP - General (General Practice)  Patient Active Problem List   Diagnosis Date Noted   Macroalbuminuric diabetic nephropathy ([place]) 08/10/2020   Hemiparesis of right dominant side due to non-cerebrovascular etiology ([place]) 08/10/2020   Diabetic ulcer of left midfoot associated with diabetes mellitus due to underlying condition, limited to breakdown of skin ([place]) 08/10/2020   Cellulitis, leg 03/17/2020   Cellulitis of right leg 03/17/2020   Uncontrolled type 2 diabetes mellitus with complication, with long-term current use of insulin (HCC)    Abscess    Cellulitis 12/05/2019   Cellulitis of left lower extremity    Abscess of left lower extremity    Uncontrolled type 2 diabetes mellitus with hyperglycemia (HCC)    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 2 diabetes mellitus ([place])    Cellulitis of foot, left 11/22/2016   Diabetic neuropathy ([place]) 11/22/2016   Fracture of 2nd metatarsal 11/22/2016   Fracture of 3rd metatarsal 11/22/2016   Postprocedural non-healing wound 11/22/2016   Acute cystitis with positive culture    Cutaneous abscess of left foot    Osteomyelitis ([place]) 11/03/2016   Poorly controlled type 2 diabetes mellitus with complication ([place]) 80/99/8338   Hyperlipidemia 10/17/2016   Depression 10/17/2016   Neck pain    Asthma 04/11/2016   Uncontrolled type 2 diabetes mellitus with complication (HCC)    Obesity, morbid ([place]) 09/06/2015   Hypertension associated with diabetes ([place]) 09/06/2015    Current Outpatient Medications:    Accu-Chek Softclix Lancets lancets, Check blood sugar BID., Disp: 100 each, Rfl: 2   Accu-Chek Softclix Lancets lancets, CHECK BLOOD SUGAR TWO TIMES DAILY (Patient taking differently: CHECK BLOOD SUGAR TWO TIMES DAILY), Disp: 100 each, Rfl: 2   acetaminophen (TYLENOL) 325 MG tablet, Take 2 tablets (650 mg  total) by mouth every 6 (six) hours as needed for mild pain or fever (for pain)., Disp: 10 tablet, Rfl: 0   Blood Glucose Monitoring Suppl (ACCU-CHEK GUIDE) w/Device KIT, 1 EACH BY DOES NOT APPLY ROUTE IN THE MORNING AND AT BEDTIME., Disp: 1 kit, Rfl: 0   Blood Glucose Monitoring Suppl (TRUE METRIX METER) w/Device KIT, 1 each by Does not apply route in the morning and at bedtime., Disp: 1 kit, Rfl: 0   Continuous Blood Gluc Receiver ([place]) DEVI, 1 Device by Does not apply route daily., Disp: 1 each, Rfl: 0   Continuous Blood Gluc Sensor (DEXCOM G6 SENSOR) MISC, 1 packet by Does not apply route daily., Disp: 3 each, Rfl: 11   Continuous Blood Gluc Transmit (DEXCOM G6 TRANSMITTER) MISC, 1 packet by Does not apply route daily., Disp: 1 each, Rfl: 4   Dulaglutide (TRULICITY) 2.50 NL/9.7QB SOPN, Inject 0.75 mg into the skin once a week., Disp: 2 mL, Rfl: 3   gabapentin (NEURONTIN) 300 MG capsule, Take 1 capsule (300 mg total) by mouth 2 (two) times daily., Disp: 180 capsule, Rfl: 1   glucose blood (TRUE METRIX BLOOD GLUCOSE TEST) test strip, Use as instructed, Disp: 100 each, Rfl: 12   glucose blood test strip, USE AS INSTRUCTED, Disp: 100 strip, Rfl: 12   insulin detemir (LEVEMIR) 100 UNIT/ML FlexPen, Inject into the skin., Disp: , Rfl:    Insulin Pen Needle (PEN NEEDLES) 31G X 5 MM MISC, 1 each by Does not apply route in the morning and at bedtime., Disp:  100 each, Rfl: 2   Insulin Pen Needle 31G X 5 MM MISC, 1 EACH BY DOES NOT APPLY ROUTE IN THE MORNING AND AT BEDTIME., Disp: 100 each, Rfl: 2   ketorolac (ACULAR) 0.5 % ophthalmic solution, Place 1 drop into the right eye 4 (four) times daily. start after surgery, Disp: 10 mL, Rfl: 1   lisinopril-hydrochlorothiazide (ZESTORETIC) 10-12.5 MG tablet, Take 1 tablet by mouth daily., Disp: 90 tablet, Rfl: 1   metFORMIN (GLUCOPHAGE) 500 MG tablet, Take 2 tablets (1,000 mg total) by mouth daily with breakfast., Disp: 180 tablet, Rfl: 1   mupirocin  ointment (BACTROBAN) 2 %, Apply 1 application topically daily., Disp: 30 g, Rfl: 2   ofloxacin (OCUFLOX) 0.3 % ophthalmic solution, Instill 1 drop in left eye four times a day; Begin one day after surgery, continue as directed., Disp: 5 mL, Rfl: 5   pravastatin (PRAVACHOL) 20 MG tablet, TAKE 1 TABLET (20 MG TOTAL) BY MOUTH DAILY., Disp: 90 tablet, Rfl: 1   prednisoLONE acetate (PRED FORTE) 1 % ophthalmic suspension, Instill 1 drop in left eye four times a day; Begin one day after surgery, continue as directed., Disp: 5 mL, Rfl: 5   sulfamethoxazole-trimethoprim (BACTRIM DS) 800-160 MG tablet, Take 1 tablet by mouth 2 (two) times daily., Disp: 14 tablet, Rfl: 0 No Known Allergies   Social History   Socioeconomic History   Marital status: Married    Spouse name: Not on file   Number of children: Not on file   Years of education: Not on file   Highest education level: Not on file  Occupational History   Not on file  Tobacco Use   Smoking status: Never   Smokeless tobacco: Never  Vaping Use   Vaping Use: Never used  Substance and Sexual Activity   Alcohol use: Yes    Comment: 11/06/2016 "I'll drink a little at parties; maybe 5 times/year"   Drug use: Yes    Types: Marijuana    Comment: 11/06/2016 "stopped ~ 1 month ago"   Sexual activity: Never    Birth control/protection: Diaphragm  Other Topics Concern   Not on file  Social History Narrative   Not on file   Social Determinants of Health   Financial Resource Strain: Not on file  Food Insecurity: Not on file  Transportation Needs: Not on file  Physical Activity: Not on file  Stress: Not on file  Social Connections: Not on file  Intimate Partner Violence: Not on file    Physical Exam Vitals reviewed.  Constitutional:      Appearance: Normal appearance. He is normal weight.  HENT:     Head: Normocephalic.     Nose: Nose normal.     Mouth/Throat:     Mouth: Mucous membranes are moist.     Pharynx: Oropharynx is clear.   Eyes:     Conjunctiva/sclera: Conjunctivae normal.  Cardiovascular:     Rate and Rhythm: Normal rate and regular rhythm.     Pulses: Normal pulses.     Heart sounds: Normal heart sounds.  Pulmonary:     Effort: Pulmonary effort is normal.     Breath sounds: Normal breath sounds.  Abdominal:     General: Abdomen is flat.     Palpations: Abdomen is soft.  Musculoskeletal:        General: No swelling. Normal range of motion.     Cervical back: Normal range of motion.     Right lower leg: No edema.  Left lower leg: No edema.  Skin:    General: Skin is warm and dry.     Capillary Refill: Capillary refill takes less than 2 seconds.  Neurological:     General: No focal deficit present.     Mental Status: He is alert. Mental status is at baseline.  Psychiatric:        Mood and Affect: Mood normal.    Arrived for initial home visit with Mr. Shams who was alert and oriented ambulating to greet me at the door. Andrw reports feeling good today and he states he has a doctors appointment at the eye doctor later today to discuss surgery options for his retina detachment. Jeneen Rinks and I discussed his medical history, social status and medications. Plan for these are as follows:  -Medications: Currently with [facility] and [facility], [facility], bubble packs and delivery as he feels bubble packs will be easier for him to use rather than pill bottles due to his hemiplegia.   -Food Insecurity: I will bring pantry items as well as [employer] [facility] and lists of local pantries.   -Transportation: Will be setting up [facility] transportation and setting up upcoming appointments.   -Rent/Utility Assistance: I sent Abed a list of assistance programs with the numbers for him to call for rent assistance.   I will be seeing K[place] weekly for now and plan to follow up on Dexcom status (was sent to [place]) so he can check his CBG  daily as now he is unable to check his sugar with only use of one hand.   Vitals were obtained. Medications were reviewed. I will complete a pill box next week and take medications to [facility] for Bubble Packs.   Appointments reviewed and wrote down on provided calender.   Home visit complete. I will see Nichlas in one week.    Refills- NONE   CBG- 340 ( he reports this is normal for him I will touch base with Lurena Joiner the pharmacist about same.)   Future Appointments  Date Time Provider [place]  09/14/2020 10:45 AM Jeri Cos [place] III, PA-C [facility]  09/15/2020  3:45 PM Criselda Peaches, DPM [facility]  09/26/2020 12:30 PM Baldomero Lamy B, PT [facility]  09/26/2020  1:15 PM Carey Bullocks, OT [facility]  09/26/2020  2:00 PM Tresa Endo, RPH-CPP [facility] None  09/26/2020  3:10 PM Ladell Pier, MD [facility] None  09/28/2020  9:30 AM Carey Bullocks, OT [facility]  09/28/2020 10:15 AM Arliss Journey, PT [facility]  10/03/2020 11:45 AM Lanice Shirts, PT [facility]  10/03/2020 12:30 PM Carey Bullocks, OT [facility]  10/05/2020  2:45 PM Zachery Conch, OT [facility]  10/05/2020  3:30 PM Lanice Shirts, PT [facility]  10/11/2020 11:45 AM Lanice Shirts, PT [facility]  10/11/2020 12:30 PM Zachery Conch, OT [facility]  10/14/2020  1:15 PM Arliss Journey, PT [facility]  10/14/2020  2:00 PM Zachery Conch, OT [facility]  10/19/2020 11:45 AM Zachery Conch, OT [facility]  10/19/2020 12:30 PM Arliss Journey, PT [facility]  10/26/2020 11:45 AM Zachery Conch, OT [facility]  10/26/2020 12:30 PM Arliss Journey, PT [facility]  11/02/2020 11:45 AM Zachery Conch, OT [facility]  11/02/2020 12:30 PM Arliss Journey, PT [facility]  ACTION: Home visit completed

## 2020-09-08 NOTE — Addendum Note (Signed)
Addended by: Arliss Journey on: 09/08/2020 11:52 AM   Modules accepted: Orders

## 2020-09-09 ENCOUNTER — Telehealth (HOSPITAL_COMMUNITY): Payer: Self-pay

## 2020-09-09 ENCOUNTER — Other Ambulatory Visit: Payer: Self-pay

## 2020-09-09 NOTE — Telephone Encounter (Signed)
[facility] Times:   6/29 wound care- pick up @ 10:20-10:30  6/30 [facility]- pick up @ 3:00-3:15

## 2020-09-14 ENCOUNTER — Encounter (HOSPITAL_BASED_OUTPATIENT_CLINIC_OR_DEPARTMENT_OTHER): Payer: Medicaid Other | Admitting: Physician Assistant

## 2020-09-14 ENCOUNTER — Telehealth (HOSPITAL_COMMUNITY): Payer: Self-pay

## 2020-09-14 NOTE — Telephone Encounter (Signed)
Spoke to [name] [name] who reports he has two appointments tomorrow so we have to reschedule home visit for next week. I will schedule transportation for 7/6 appointment and will see him in the home next Thursday. Call complete.

## 2020-09-15 ENCOUNTER — Other Ambulatory Visit: Payer: Self-pay

## 2020-09-15 ENCOUNTER — Ambulatory Visit (INDEPENDENT_AMBULATORY_CARE_PROVIDER_SITE_OTHER): Payer: Medicaid Other | Admitting: Podiatry

## 2020-09-15 ENCOUNTER — Telehealth (HOSPITAL_COMMUNITY): Payer: Self-pay

## 2020-09-15 ENCOUNTER — Other Ambulatory Visit: Payer: Self-pay | Admitting: Internal Medicine

## 2020-09-15 DIAGNOSIS — L97522 Non-pressure chronic ulcer of other part of left foot with fat layer exposed: Secondary | ICD-10-CM

## 2020-09-15 DIAGNOSIS — S92022P Displaced fracture of anterior process of left calcaneus, subsequent encounter for fracture with malunion: Secondary | ICD-10-CM

## 2020-09-15 NOTE — Telephone Encounter (Signed)
Transportation set up for [name] [name] with [facility] transport.   7/6 Wound Care for 2:00 appointment PICK UP FROM HOME @ 1:25  7/11 [facility] for 12:30 appointment [place] @ 11:50  Proceed to [place] for 2:55 appointment after NeuroRehab appointment. Patient will call [facility] to be picked up and taken to [place].   Patient made aware of same. Call complete.

## 2020-09-15 NOTE — Progress Notes (Signed)
  Subjective:  Patient ID: [name] [name], male    DOB: Sep 27, 1985,  MRN: [id number]  Chief Complaint  Patient presents with   Foot Ulcer      wound care left foot    35 y.o. male presents with the above complaint. History confirmed with patient.  Having his A1c checked in a couple weeks as well as an MRI of the left foot ordered by wound care, has been doing daily local wound care at home Objective:  Physical Exam: warm, good capillary refill and normal DP and PT pulses. Strength 4-/5 on right side, ulcer submet 5. 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 Ulcer over sinus tarsi with hard bony spur underneath. 1.2cm in diameter, hyperkeratosis around this with fibrogranular base. No signs of infection. No exposed bone. Onychomycosis x8  Radiographs: X-ray of the left foot: partial ray amputation of 1st, 2nd toe amp, HO formation around 2nd and 3rd metatarsals distally, large bony prominence over CCJ, degenerative changes in STJ, midtarsal joint. Appears to be anterior process fractured off     CT 02/29/2020 IMPRESSION: 1. 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 2. 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 Assessment:   1. 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 Closed displaced fracture of anterior process of left calcaneus with malunion, subsequent encounter      Plan:  Patient was evaluated and treated and all questions answered.  Patient educated on diabetes. Discussed proper diabetic foot care and discussed risks and complications of disease. Educated patient in depth on reasons to return to the office immediately should he/she discover anything concerning or new on the  feet. All questions answered. Discussed proper shoes as well.   He is at high risk of further limb loss due to active ulceration, profound neuropathy and uncontrolled DM. He needs strict glycemic control in order to heal his wounds, he likely needs surgical intervention for the bony mass  Slowly improving.  I do not think this ulceration will completely heal unless the exostosis is removed.  He is due for an A1c checked soon and I will see him afterwards to evaluate for surgical planning.  MRI is also ordered and I think will be helpful to evaluate for underlying infection.  Return after his PCP visit  Ulcer left lafter foot -Debridement as below. -Dressed with Prisma and DSD, I dispensed this to him and he will apply this daily  Procedure: Excisional Debridement of Wound Rationale: Removal of non-viable soft tissue from the wound to promote healing.  Anesthesia: none Pre-Debridement Wound Measurements: 1.2 cm x 1.2cm x 0.1 cm  Post-Debridement Wound Measurements: same as pre-debridement  Type of Debridement: Sharp selective Tissue Removed: Non-viable soft tissue Depth of Debridement: subcutaneous tissue. Technique: Sharp selective debridement to bleeding, viable wound base.  Dressing: Dry, sterile, compression dressing. Disposition: Patient tolerated procedure well. Patient to return in 1 week for follow-up.        No follow-ups on file.

## 2020-09-16 ENCOUNTER — Ambulatory Visit (HOSPITAL_COMMUNITY): Payer: Medicaid Other

## 2020-09-16 ENCOUNTER — Encounter (HOSPITAL_COMMUNITY): Payer: Self-pay

## 2020-09-21 ENCOUNTER — Telehealth (HOSPITAL_COMMUNITY): Payer: Self-pay

## 2020-09-21 ENCOUNTER — Encounter (HOSPITAL_BASED_OUTPATIENT_CLINIC_OR_DEPARTMENT_OTHER): Payer: Medicaid Other | Attending: Physician Assistant | Admitting: Physician Assistant

## 2020-09-21 NOTE — Telephone Encounter (Signed)
[name] [name] and I rescheduled his home visit for today due to him having appointments during our scheduled visit. I will see [name] [name] in one week but will be assisting him with setting up transportation with upcoming appointments.

## 2020-09-22 ENCOUNTER — Telehealth: Payer: Self-pay | Admitting: Internal Medicine

## 2020-09-22 NOTE — Telephone Encounter (Signed)
Copied from [place] [id number]. Topic: General - Other >> Sep 15, 2020  9:19 AM Leward Quan A wrote: Reason for CRM: Pharmacy called in to inform Dr Wynetta Emery that prior authorization is needed on one of the devises ordered this morning per patients insurance. Will be faxed over for completion.

## 2020-09-22 NOTE — Telephone Encounter (Signed)
Will forward to [employer]

## 2020-09-26 ENCOUNTER — Ambulatory Visit: Payer: Medicaid Other

## 2020-09-26 ENCOUNTER — Ambulatory Visit: Payer: Medicaid Other | Admitting: Pharmacist

## 2020-09-26 ENCOUNTER — Ambulatory Visit: Payer: Self-pay

## 2020-09-26 ENCOUNTER — Telehealth: Payer: Self-pay | Admitting: Internal Medicine

## 2020-09-26 ENCOUNTER — Other Ambulatory Visit: Payer: Self-pay

## 2020-09-26 ENCOUNTER — Ambulatory Visit: Payer: Medicaid Other | Attending: Internal Medicine | Admitting: Occupational Therapy

## 2020-09-26 ENCOUNTER — Ambulatory Visit: Payer: Medicaid Other | Admitting: Internal Medicine

## 2020-09-26 VITALS — BP 145/110 | HR 91

## 2020-09-26 DIAGNOSIS — R41842 Visuospatial deficit: Secondary | ICD-10-CM | POA: Insufficient documentation

## 2020-09-26 DIAGNOSIS — R2689 Other abnormalities of gait and mobility: Secondary | ICD-10-CM | POA: Insufficient documentation

## 2020-09-26 DIAGNOSIS — R261 Paralytic gait: Secondary | ICD-10-CM | POA: Insufficient documentation

## 2020-09-26 DIAGNOSIS — M6281 Muscle weakness (generalized): Secondary | ICD-10-CM | POA: Insufficient documentation

## 2020-09-26 DIAGNOSIS — R2681 Unsteadiness on feet: Secondary | ICD-10-CM | POA: Insufficient documentation

## 2020-09-26 NOTE — Therapy (Signed)
[facility] [address] [place], [place], [address] Phone: [phone]   Fax:  [phone]  Physical Therapy Treatment  Patient Details  Name: [name] [name] MRN: [id number] Date of Birth: September 28, 1985 Referring Provider (PT): Ladell Pier, MD   Encounter Date: 09/26/2020   PT End of Session - 09/26/20 1236     Visit Number 1   arrived - no charge   Number of Visits 13    Date for PT Re-Evaluation 11/30/20    Authorization Type [employer]    PT Start Time [id number]    PT Stop Time 1300    PT Time Calculation (min) 29 min    Equipment Utilized During Treatment Gait belt    Activity Tolerance Patient tolerated treatment well    Behavior During Therapy [facility] for tasks assessed/performed             Past Medical History:  Diagnosis Date   Arthritis    "neck; maybe in my fingers" (11/06/2016)   Asthma    Bipolar disorder ([place])    "IED: intentional deficit disorder"   Depression    Heart murmur    "when I was a child"   Hypertension    Type II diabetes mellitus ([place])     Past Surgical History:  Procedure Laterality Date   AMPUTATION Left 11/05/2016   Procedure: AMPUTATION LEFT GREAT TOE;  Surgeon: Newt Minion, MD;  Location: [facility];  Service: Orthopedics;  Laterality: Left;   CATARACT EXTRACTION Left 07/2020   DRESSING CHANGE UNDER ANESTHESIA N/A 04/13/2016   Procedure: DRESSING CHANGE UNDER ANESTHESIA;  Surgeon: Johnathan Hausen, MD;  Location: [facility];  Service: General;  Laterality: N/A;   INCISION AND DRAINAGE ABSCESS Right 09/03/2015   Procedure: INCISION AND DRAINAGE ABSCESS;  Surgeon: Roseanne Kaufman, MD;  [facility];  Service: Orthopedics;  Laterality: Right;   INCISION AND DRAINAGE PERIRECTAL ABSCESS N/A 04/11/2016   Procedure: IRRIGATION AND DEBRIDEMENT RECTAL ABSCESS;  Surgeon: Johnathan Hausen, MD;  [facility];  Service: General;  Laterality: N/A;   [place]; 1989    x2    IRRIGATION AND DEBRIDEMENT ABSCESS Left 08/31/2015   Procedure: IRRIGATION AND DEBRIDEMENT ABSCESS LEFT SHOULDER;  Surgeon: Alphonsa Overall, MD;  [facility];  Service: General;  Laterality: Left;   WOUND DEBRIDEMENT N/A 02/24/2016   Procedure: SACRAL WOUND DEBRIDEMENT;  Surgeon: Georganna Skeans, MD;  Location: [facility];  Service: General;  Laterality: N/A;    Vitals:   09/26/20 1237 09/26/20 1242 09/26/20 1246  BP: (!) 132/100 (!) 128/103 (!) 145/110  Pulse: 91 90 91     Subjective Assessment - 09/26/20 1234     Subjective Patient reports that 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 Next surgery is scheduled for Wednesday of this week. No falls, did have one stumble where he feel into the couch. Not using AD at this time. Patient reports no pain currently, but has had some sharp pains down the R arm.    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 around this building 1x (approx. 1000')    Patient Stated Goals wants to build  up his strength on his R side.    Currently in Pain? No/denies               Patient arrived to session, patient reporting recent change in vision in R eye and that he is having surgery on Wednesday to address. Due to vision impairments and no assistance at home patient has been unable to take medications as prescribed. Patient BP readings were as followed even with seated rest break and deep breathing to allow for relaxation: 132/100, 128/103, 145/110. Patient asymptomatic, denies concerning's signs/symptoms of CVA. PT consulted OT scheduled to see patient today, and OT provided education on techniques to promote improved ability to  take medications with vision impairment. Patient is scheduled to see PCP this evening, and has a nurse coming into the home on Thursday 7/14. PT/OT services placed on hold until after surgery, PT also provided note to patient to give to MD regarding resume orders and precautions after surgery.         PT Education - 09/26/20 1239     Education Details monitor BP, importance of medications    Person(s) Educated Patient    Methods Explanation    Comprehension Verbalized understanding              PT Short Term Goals - 09/08/20 1053       PT SHORT TERM GOAL #1   Title Pt will be independent with initial HEP in order to build upon functional gains made in therapy. ALL STGS DUE 10/06/20    Baseline currently dependent    Time 4    Period Weeks    Status New    Target Date 10/06/20      PT SHORT TERM GOAL #2   Title Pt will undergo further assessment of BERG with LTG to be written as appropriate to decr fall risk.    Baseline not yet assessed.    Time 4    Period Weeks    Status New      PT SHORT TERM GOAL #3   Title Pt will decr TUG time with no AD to 15 seconds or less in order to demo decr fall risk.    Baseline 17.13 seconds    Time 4    Period Weeks    Status New      PT SHORT TERM GOAL #4   Title Pt will ambulate at least 22' with no AD and R AFO with supervision over level surfaces in order to demo improved household mobility.    Baseline supervision/min guard with no AD and no AFO    Time 4    Period Weeks    Status New      PT SHORT TERM GOAL #5   Title Pt will improve gait speed to at least 2.4 ft/sec with no AD vs. LRAD and R AFO in order to demo improved community mobility.    Baseline 2.11 ft/sec    Time 4    Period Weeks    Status New               PT Long Term Goals - 09/08/20 1058       PT LONG TERM GOAL #1   Title Pt will be independent with final HEP in order to build upon functional gains made in therapy. ALL LTGS DUE 11/10/20     Baseline currently dependent    Time 9    Period Weeks    Status New  Target Date 11/10/20      PT LONG TERM GOAL #2   Title BERG goal to be written as appropriate in order to demo decr fall risk.    Baseline not yet assessed.    Time 9    Period Weeks    Status New      PT LONG TERM GOAL #3   Title Pt will ambulate at least 81' with no AD vs. LRAD and R AFO with supervision over unlevel paved surfaces in order to demo improved community mobility.    Baseline not yet assessed outdoors.    Time 9    Period Weeks    Status New      PT LONG TERM GOAL #4   Title Pt will perform 8 steps with single handrail with step to pattern with supervision in order to safely get in and out of the house.    Baseline 4 steps with heavily reliant on handrail and min guard.    Time 9    Period Weeks    Status New      PT LONG TERM GOAL #5   Title Pt will improve gait speed to at least 2.7 ft/sec with no AD vs. LRAD and R AFO in order to demo improved community mobility.    Baseline 2.11 ft/sec    Time 9    Period Weeks    Status New                    Patient will benefit from skilled therapeutic intervention in order to improve the following deficits and impairments:     Visit Diagnosis: Unsteadiness on feet  Muscle weakness (generalized)  Other abnormalities of gait and mobility     Problem List Patient Active Problem List   Diagnosis Date Noted   Macroalbuminuric diabetic nephropathy ([place]) 08/10/2020   Hemiparesis of right dominant side due to non-cerebrovascular etiology ([place]) 08/10/2020   Diabetic ulcer of left midfoot associated with diabetes mellitus due to underlying condition, limited to breakdown of skin ([place]) 08/10/2020   Cellulitis, leg 03/17/2020   Cellulitis of right leg 03/17/2020   Uncontrolled type 2 diabetes mellitus with complication, with long-term current use of insulin ([place])    Abscess    Cellulitis 12/05/2019   Cellulitis of left lower  extremity    Abscess of left lower extremity    Uncontrolled type 2 diabetes mellitus with hyperglycemia (HCC)    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 2 diabetes mellitus ([place])    Cellulitis of foot, left 11/22/2016   Diabetic neuropathy ([place]) 11/22/2016   Fracture of 2nd metatarsal 11/22/2016   Fracture of 3rd metatarsal 11/22/2016   Postprocedural non-healing wound 11/22/2016   Acute cystitis with positive culture    Cutaneous abscess of left foot    Osteomyelitis ([place]) 11/03/2016   Poorly controlled type 2 diabetes mellitus with complication ([place]) [id number]   Hyperlipidemia 10/17/2016   Depression 10/17/2016   Neck pain    Asthma 04/11/2016   Uncontrolled type 2 diabetes mellitus with complication ([place])    Obesity, morbid ([place]) 09/06/2015   Hypertension associated with diabetes ([place]) 09/06/2015    Jones Bales, PT, DPT 09/26/2020, 1:05 PM  [facility] [address] [place], [place], [address] Phone: [phone]   Fax:  [phone]  Name: Burnice Dornbush MRN: [id number] Date of Birth: [birth year]

## 2020-09-26 NOTE — Telephone Encounter (Signed)
Patient unable to make appointment scheduled today due to transportation.  Advised RN to inform patient to f/u with [facility]- [facility] operation on tomorrow.   Will route  to PCP

## 2020-09-26 NOTE — Telephone Encounter (Signed)
Pt called to report "burning, throbbing pain form mid upper arm to mid forearm." Pt also c/o mid back pain and right side of neck to the base of neck. Pt c/o pain to right upper chest to right axilla. The sx began 2 weeks ago. Pain comes and goes. Pt describes the right upper chest to right axilla as a stabbing pain.  Pt denies SOB or sweating. Pt stated for the past 2 months had a vomiting and acid reflux and BP has been elevated.  Pt is blind. Last month after left eye cataract and retina surgery he lost vision again on 09/19/20. Pt stated cannot see meds to take them so he has missed for a few days. Pt has h/o HTN. Pt cannot check BP or set out meds. He is due for eye surgery Thursday and stated he does not have transportation.  Pt stated he is unable to do virtual appointment because he cannot see how to set up the MyChart account. I did send link via e-mail.   Pt stated he is supposed to have a nurse this Thursday for 2 hours.  Pt complained of not having anyone to call for transportation.  Pt stated he was unable to come to [facility] because  his transportation did not show up.   Pt stated he thinks he needs to go to a nursing home. He stated, "I just can't take care of myself right now."  Advised pt to use Siri on his iPhone to call 911.   Answer Assessment - Initial Assessment Questions 1. SYMPTOM: "What is the main symptom you are concerned about?" (e.g., weakness, numbness)     Burning throbbing type pain  on posterior arm, right side of body lower neck , mid back pain with standing that affects the back  2. ONSET: "When did this start?" (minutes, hours, days; while sleeping)     2 weeks 3. LAST NORMAL: "When was the last time you (the patient) were normal (no symptoms)?"     2 weeks ago 4. PATTERN "Does this come and go, or has it been constant since it started?"  "Is it present now?"     Comes and goes- no        every episode is longer than the previous 5. CARDIAC SYMPTOMS: "Have you had  any of the following symptoms: chest pain, difficulty breathing, palpitations?"     Mild chest pain  to right upper deltoid to the right axilla feels like stabbed  6. NEUROLOGIC SYMPTOMS: "Have you had any of the following symptoms: headache, dizziness, vision loss, double vision, changes in speech, unsteady on your feet?"     Blind - last months Left cataract and retina surgery went blind 09/19/20 retina surgery needed Dr.  Clovia Cuff 7. OTHER SYMPTOMS: "Do you have any other symptoms?"     Acid reflux and vomiting for 2 months sx are worsening, BP has been elevated.  Protocols used: Neurologic Deficit-A-AH

## 2020-09-26 NOTE — Telephone Encounter (Signed)
Pt is calling to schedule transportation to his surgery on Wednesday. Advised pt that the appt would need to give transportation 3 days notice. Please advise CB- [phone]

## 2020-09-26 NOTE — Patient Instructions (Signed)
Please provide resume orders and advise of any restrictions for participation in Occupational and Physical Therapy after surgery.

## 2020-09-26 NOTE — Therapy (Signed)
[facility] [address] [place], [place], [address] Phone: [phone]   Fax:  [phone]  Patient Details  Name: [name] [name] MRN: [id number] Date of Birth: 1985/10/05 Referring Provider:  Ladell Pier, MD  Encounter Date: 09/26/2020  Pt arrived for P.T. appointment (prior to scheduled O.T. appt) but ended early due to high BP. Pt also scheduled for eye surgery on Wednesday due to decline from diabetic retinopathy Lt eye. Pt reported that he was not taking all of his meds due to dropping on floor and unable to find them because of visual loss. Pt reports no help available at boarding house. Also noted burn on finger of Lt hand from cooking.  O.T. briefly discussed concerns w/ patient prior to leaving (no charge) and possible compensatory strategies for vision, however pt limited by decreased functional use of RUE as well as sensory loss d/t neuropathy bilateral UE's. Will discuss further at next O.T. appointment  Carey Bullocks, OTR/L 09/26/2020, 1:31 PM  [facility] [address] [place], [place], [address] Phone: [phone]   Fax:  [phone]

## 2020-09-28 ENCOUNTER — Telehealth (HOSPITAL_COMMUNITY): Payer: Self-pay

## 2020-09-28 ENCOUNTER — Ambulatory Visit: Payer: Medicaid Other | Admitting: Physical Therapy

## 2020-09-28 ENCOUNTER — Telehealth: Payer: Self-pay | Admitting: Internal Medicine

## 2020-09-28 ENCOUNTER — Ambulatory Visit: Payer: Medicaid Other | Admitting: Occupational Therapy

## 2020-09-28 NOTE — Telephone Encounter (Signed)
Called to set up visit for today and [name] [name] reports he has surgery on his eye today and will not be home for a visit. I will reschedule tomorrow. Call complete.

## 2020-09-28 NOTE — Telephone Encounter (Signed)
Copied from [place] [phone]. 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

## 2020-09-29 ENCOUNTER — Other Ambulatory Visit (HOSPITAL_BASED_OUTPATIENT_CLINIC_OR_DEPARTMENT_OTHER): Payer: Self-pay

## 2020-09-29 MED ORDER — PREDNISOLONE ACETATE 1 % OP SUSP
OPHTHALMIC | 2 refills | Status: DC
  Filled 2020-09-29: qty 5, 25d supply, fill #0

## 2020-09-29 MED ORDER — OFLOXACIN 0.3 % OP SOLN
OPHTHALMIC | 5 refills | Status: DC
  Filled 2020-09-29: qty 5, 25d supply, fill #0

## 2020-09-30 ENCOUNTER — Other Ambulatory Visit: Payer: Self-pay

## 2020-09-30 ENCOUNTER — Other Ambulatory Visit: Payer: Self-pay | Admitting: Internal Medicine

## 2020-09-30 DIAGNOSIS — E113299 Type 2 diabetes mellitus with mild nonproliferative diabetic retinopathy without macular edema, unspecified eye: Secondary | ICD-10-CM

## 2020-09-30 DIAGNOSIS — I152 Hypertension secondary to endocrine disorders: Secondary | ICD-10-CM

## 2020-09-30 DIAGNOSIS — E1121 Type 2 diabetes mellitus with diabetic nephropathy: Secondary | ICD-10-CM

## 2020-09-30 MED ORDER — LISINOPRIL-HYDROCHLOROTHIAZIDE 10-12.5 MG PO TABS: 1.0000 | ORAL_TABLET | Freq: Every day | ORAL | 5 refills | Status: DC

## 2020-09-30 MED ORDER — TRULICITY 0.75 MG/0.5ML ~~LOC~~ SOAJ: 0.7500 mg | SUBCUTANEOUS | 5 refills | Status: DC

## 2020-09-30 NOTE — Progress Notes (Signed)
Paramedicine Encounter    Patient ID: [name] [name], male    DOB: 09/04/1985, 35 y.o.   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   Hemiparesis of right dominant side due to non-cerebrovascular etiology ([place]) 08/10/2020   Diabetic ulcer of left midfoot associated with diabetes mellitus due to underlying condition, limited to breakdown of skin ([place]) 08/10/2020   Cellulitis, leg 03/17/2020   Cellulitis of right leg 03/17/2020   Uncontrolled type 2 diabetes mellitus with complication, with long-term current use of insulin ([place])    Abscess    Cellulitis 12/05/2019   Cellulitis of left lower extremity    Abscess of left lower extremity    Uncontrolled type 2 diabetes mellitus with hyperglycemia (HCC)    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 2 diabetes mellitus ([place])    Cellulitis of foot, left 11/22/2016   Diabetic neuropathy ([place]) 11/22/2016   Fracture of 2nd metatarsal 11/22/2016   Fracture of 3rd metatarsal 11/22/2016   Postprocedural non-healing wound 11/22/2016   Acute cystitis with positive culture    Cutaneous abscess of left foot    Osteomyelitis ([place]) 11/03/2016   Poorly controlled type 2 diabetes mellitus with complication ([place]) 38/46/6599   Hyperlipidemia 10/17/2016   Depression 10/17/2016   Neck pain    Asthma 04/11/2016   Uncontrolled type 2 diabetes mellitus with complication (HCC)    Obesity, morbid ([place]) 09/06/2015   Hypertension associated with diabetes ([place]) 09/06/2015    Current Outpatient Medications:    Accu-Chek Softclix Lancets lancets, Check blood sugar BID. (Patient not taking: Reported on 09/08/2020), Disp: 100 each, Rfl: 2   Accu-Chek Softclix Lancets lancets, CHECK BLOOD SUGAR TWO TIMES DAILY (Patient not taking: Reported on 09/08/2020), Disp: 100 each, Rfl: 2   acetaminophen (TYLENOL) 325 MG tablet, Take 2  tablets (650 mg total) by mouth every 6 (six) hours as needed for mild pain or fever (for pain)., Disp: 10 tablet, Rfl: 0   Blood Glucose Monitoring Suppl (ACCU-CHEK GUIDE) w/Device KIT, 1 EACH BY DOES NOT APPLY ROUTE IN THE MORNING AND AT BEDTIME. (Patient not taking: Reported on 09/08/2020), Disp: 1 kit, Rfl: 0   Blood Glucose Monitoring Suppl (TRUE METRIX METER) w/Device KIT, 1 each by Does not apply route in the morning and at bedtime. (Patient not taking: Reported on 09/08/2020), Disp: 1 kit, Rfl: 0   Continuous Blood Gluc Receiver ([place]) DEVI, 1 Device by Does not apply route daily., Disp: 1 each, Rfl: 0   Continuous Blood Gluc Sensor (DEXCOM G6 SENSOR) MISC, 1 packet by Does not apply route daily., Disp: 3 each, Rfl: 11   Continuous Blood Gluc Transmit (DEXCOM G6 TRANSMITTER) MISC, USE AS DIRECTED DAILY, Disp: 1 each, Rfl: 4   Dulaglutide (TRULICITY) 3.57 SV/7.7LT SOPN, Inject 0.75 mg into the skin once a week., Disp: 2 mL, Rfl: 3   gabapentin (NEURONTIN) 300 MG capsule, Take 1 capsule (300 mg total) by mouth 2 (two) times daily., Disp: 180 capsule, Rfl: 1   glucose blood (TRUE METRIX BLOOD GLUCOSE TEST) test strip, Use as instructed (Patient not taking: Reported on 09/08/2020), Disp: 100 each, Rfl: 12   glucose blood test strip, USE AS INSTRUCTED (Patient not taking: Reported on 09/08/2020), Disp: 100 strip, Rfl: 12   insulin detemir (LEVEMIR) 100 UNIT/ML FlexPen, Inject into the skin., Disp: , Rfl:    Insulin Pen  Needle (PEN NEEDLES) 31G X 5 MM MISC, 1 each by Does not apply route in the morning and at bedtime., Disp: 100 each, Rfl: 2   Insulin Pen Needle 31G X 5 MM MISC, 1 EACH BY DOES NOT APPLY ROUTE IN THE MORNING AND AT BEDTIME., Disp: 100 each, Rfl: 2   ketorolac (ACULAR) 0.5 % ophthalmic solution, Place 1 drop into the right eye 4 (four) times daily. start after surgery, Disp: 10 mL, Rfl: 1   lisinopril-hydrochlorothiazide (ZESTORETIC) 10-12.5 MG tablet, Take 1 tablet by mouth  daily., Disp: 90 tablet, Rfl: 1   metFORMIN (GLUCOPHAGE) 500 MG tablet, Take 2 tablets (1,000 mg total) by mouth daily with breakfast., Disp: 180 tablet, Rfl: 1   mupirocin ointment (BACTROBAN) 2 %, Apply 1 application topically daily., Disp: 30 g, Rfl: 2   ofloxacin (OCUFLOX) 0.3 % ophthalmic solution, Instill 1 drop in left eye four times a day; Begin one day after surgery, continue as directed., Disp: 5 mL, Rfl: 5   ofloxacin (OCUFLOX) 0.3 % ophthalmic solution, Instill 1 drop in left eye four times a day; Begin one day after surgery, Continue as directed., Disp: 10 mL, Rfl: 5   pravastatin (PRAVACHOL) 20 MG tablet, TAKE 1 TABLET (20 MG TOTAL) BY MOUTH DAILY., Disp: 90 tablet, Rfl: 1   prednisoLONE acetate (PRED FORTE) 1 % ophthalmic suspension, Instill 1 drop in left eye four times a day; Begin one day after surgery, continue as directed., Disp: 5 mL, Rfl: 5   prednisoLONE acetate (PRED FORTE) 1 % ophthalmic suspension, Instill 1 drop in left eye four times a day, Disp: 5 mL, Rfl: 2   sulfamethoxazole-trimethoprim (BACTRIM DS) 800-160 MG tablet, Take 1 tablet by mouth 2 (two) times daily., Disp: 14 tablet, Rfl: 0 No Known Allergies   Social History   Socioeconomic History   Marital status: Married    Spouse name: Not on file   Number of children: Not on file   Years of education: Not on file   Highest education level: Not on file  Occupational History   Not on file  Tobacco Use   Smoking status: Never   Smokeless tobacco: Never  Vaping Use   Vaping Use: Never used  Substance and Sexual Activity   Alcohol use: Yes    Comment: 11/06/2016 "I'll drink a little at parties; maybe 5 times/year"   Drug use: Yes    Types: Marijuana    Comment: 11/06/2016 "stopped ~ 1 month ago"   Sexual activity: Never    Birth control/protection: Diaphragm  Other Topics Concern   Not on file  Social History Narrative   Not on file   Social Determinants of Health   Financial Resource Strain: Not on  file  Food Insecurity: Not on file  Transportation Needs: Not on file  Physical Activity: Not on file  Stress: Not on file  Social Connections: Not on file  Intimate Partner Violence: Not on file    Physical Exam Vitals reviewed.  Constitutional:      Appearance: Normal appearance. He is normal weight.  HENT:     Head: Normocephalic.     Nose: Nose normal.     Mouth/Throat:     Mouth: Mucous membranes are moist.  Eyes:     Comments: Left eye, red, swollen. Recent procedure.   Cardiovascular:     Rate and Rhythm: Normal rate and regular rhythm.     Pulses: Normal pulses.     Heart sounds: Normal heart sounds.  Pulmonary:     Effort: Pulmonary effort is normal.     Breath sounds: Normal breath sounds.  Abdominal:     General: Abdomen is flat.     Palpations: Abdomen is soft.  Musculoskeletal:        General: No swelling. Normal range of motion.     Cervical back: Normal range of motion.     Right lower leg: No edema.     Left lower leg: No edema.  Skin:    General: Skin is warm and dry.     Capillary Refill: Capillary refill takes less than 2 seconds.  Neurological:     General: No focal deficit present.     Mental Status: He is alert. Mental status is at baseline.  Psychiatric:        Mood and Affect: Mood normal.    Arrived for home visit for Geovanny who reports he is feeling okay. He had a procedure done in his left eye two days ago to help repair his sight. He reports that he is able to see now but still has some healing left from the procedure to be able to confirm his sight has improved.   I reviewed medications and noted he is taking Amlodipine 22m daily in the mornings. Not Lisinopril, as he does not have Lisinopril at home. I will reach out to Dr. JWynetta Emeryabout same.   Patient received his Dexcom and placed it on himself and is monitoring his sugars daily. Today his CBG was 176  Vitals were obtained and assessment as noted. BP noted to be elevated as patient  has not yet has his medications.   I gave him his morning medications and his daily eye drops.   I filled pill box accordingly.   Refills needed -trulicity -lisinopril   I reviewed appointments and transportation pick up times which I organized and printed out listing all appointments and pick up times with [facility] affiliated facilities for the remainder of this month.   [facility] and is receiving 2 hours daily with the company, [employer].   I plan to see JRasulin two weeks pending medication delivery and changes.        Future Appointments  Date Time Provider DPowers 10/03/2020 11:45 AM ZLanice Shirts PT [facility] 10/03/2020 12:30 PM [facility]arey Bullocks [facility] 10/04/2020  3:30 PM VTresa Endo RPH-CPP CHW-CHWW None  10/05/2020  2:45 PM SZachery Conch [facility] 10/05/2020  3:30 PM [facility]Lanice Shirts [facility] 10/06/2020  3:45 PM MSherryle Lis AStephan Minister DPM [facility]  10/11/2020 11:45 AM ZLanice Shirts [facility] 10/11/2020 12:30 PM [facility]achery Conch [facility] 10/14/2020  1:15 PM GArliss Journey PT [facility] 10/14/2020  2:00 PM [facility]y Conch [facility] 10/19/2020 11:45 AM SZachery Conch OT [facility] 10/19/2020 12:30 PM GArliss Journey [facility] 10/26/2020 11:45 AM SZachery Conch OT [facility] 10/26/2020 12:30 PM GArliss Journey PT [facility] 11/02/2020 11:45 AM [facility]achery Conch [facility] 11/02/2020 12:30 PM GArliss Journey [facility] 11/17/2020  3:50 PM JLadell Pier MD CHW-CHWW None     ACTION: Home visit completed

## 2020-10-03 ENCOUNTER — Ambulatory Visit: Payer: Medicaid Other

## 2020-10-03 ENCOUNTER — Telehealth: Payer: Self-pay

## 2020-10-03 ENCOUNTER — Ambulatory Visit: Payer: Medicaid Other | Admitting: Occupational Therapy

## 2020-10-03 NOTE — Telephone Encounter (Addendum)
Spoke with patient today to cancel his appointment with [facility] for 7/19. Verified the patients name and date of birth.   Patient did share the [employer], [name] came out on 09/30/20. She checked the wound on the patients side, according to the patient the wound had healed. Patient does have an appointment for wound clinic for 10/06/20 also. Patient also shared he has been able to use his Dexcom without assistance as well.Patient has been made aware his appointment with [facility] for 7/19 has been cancelled.    Patient shared his eye sight in the left has greatly improved since the surgery.   Patient shared he is looking forward to being able to relocate soon and have his daughters come live with him soon (not concrete evidence on information regarding his children.   CM shared with the patient to please reach out to [facility] for any further assistance.

## 2020-10-03 NOTE — Telephone Encounter (Signed)
Thanks [employer]. The patient's appointment has been cancelled for 7/19. Heather, [employer] has been out to assist with using his Dexcom (no longer using Trulicity) and to check his sore as well. The sore has apparently healed, I shared with the patient to request the provider seeing him for his wound care to check his side as well.

## 2020-10-04 ENCOUNTER — Ambulatory Visit: Payer: Medicaid Other | Admitting: Pharmacist

## 2020-10-04 ENCOUNTER — Telehealth (HOSPITAL_COMMUNITY): Payer: Self-pay

## 2020-10-04 NOTE — Telephone Encounter (Signed)
Spoke to [employer] who will be delivering his medications tomorrow.   -Trulicity -Lisinopril    Wilmer Floor made aware of this as well.   Follow up next week in the home.

## 2020-10-05 ENCOUNTER — Ambulatory Visit: Payer: Medicaid Other | Admitting: Occupational Therapy

## 2020-10-05 ENCOUNTER — Ambulatory Visit: Payer: Medicaid Other

## 2020-10-06 ENCOUNTER — Other Ambulatory Visit: Payer: Self-pay

## 2020-10-06 ENCOUNTER — Ambulatory Visit (INDEPENDENT_AMBULATORY_CARE_PROVIDER_SITE_OTHER): Payer: Medicaid Other | Admitting: Podiatry

## 2020-10-06 DIAGNOSIS — E1165 Type 2 diabetes mellitus with hyperglycemia: Secondary | ICD-10-CM

## 2020-10-06 DIAGNOSIS — E118 Type 2 diabetes mellitus with unspecified complications: Secondary | ICD-10-CM

## 2020-10-06 DIAGNOSIS — L97522 Non-pressure chronic ulcer of other part of left foot with fat layer exposed: Secondary | ICD-10-CM

## 2020-10-06 DIAGNOSIS — IMO0002 Reserved for concepts with insufficient information to code with codable children: Secondary | ICD-10-CM

## 2020-10-11 ENCOUNTER — Ambulatory Visit: Payer: Medicaid Other

## 2020-10-11 ENCOUNTER — Ambulatory Visit: Payer: Medicaid Other | Admitting: Occupational Therapy

## 2020-10-11 DIAGNOSIS — R261 Paralytic gait: Secondary | ICD-10-CM | POA: Insufficient documentation

## 2020-10-11 NOTE — Progress Notes (Signed)
  Subjective:  Patient ID: [name] [name], male    DOB: November 01, 1985,  MRN: [id number]  Chief Complaint  Patient presents with   Foot Ulcer    3 week follow up left foot    35 y.o. male presents with the above complaint. History confirmed with patient.  Having his A1c checked in a couple weeks as well as an MRI of the left foot ordered by wound care, has been doing daily local wound care at home  Objective:  Physical Exam: warm, good capillary refill and normal DP and PT pulses. Strength 4-/5 on right side, ulcer submet 5. 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 Ulcer over sinus tarsi with hard bony spur underneath. 1.2cm in diameter, hyperkeratosis around this with fibrogranular base. No signs of infection. No exposed bone. Onychomycosis x8  Radiographs: X-ray of the left foot: partial ray amputation of 1st, 2nd toe amp, HO formation around 2nd and 3rd metatarsals distally, large bony prominence over CCJ, degenerative changes in STJ, midtarsal joint. Appears to be anterior process fractured off     CT 02/29/2020 IMPRESSION: 1. 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 2. 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 Assessment:   1. Uncontrolled type 2 diabetes mellitus with complication [facility])      Plan:  Patient was evaluated and treated and all questions answered.  Patient educated on diabetes. Discussed proper diabetic foot care and discussed risks and complications of disease. Educated patient in depth on reasons to return to the office immediately should he/she discover anything concerning or new on the feet. All questions answered. Discussed proper shoes as well.   He is at high risk of further  limb loss due to active ulceration, profound neuropathy and uncontrolled DM. He needs strict glycemic control in order to heal his wounds, he likely needs surgical intervention for the bony mass  I ordered a new A1c for him to evaluate for surgical planning.  He will return to see me after his MRI is completed  Ulcer left lateral foot -Debridement as below. -Dressed with Prisma and DSD, I dispensed this to him and he will apply this daily  Procedure: Excisional Debridement of Wound Rationale: Removal of non-viable soft tissue from the wound to promote healing.  Anesthesia: none Pre-Debridement Wound Measurements: 1.2 cm x 1.2cm x 0.1 cm  Post-Debridement Wound Measurements: same as pre-debridement  Type of Debridement: Sharp selective Tissue Removed: Non-viable soft tissue Depth of Debridement: subcutaneous tissue. Technique: Sharp selective debridement to bleeding, viable wound base.  Dressing: Dry, sterile, compression dressing. Disposition: Patient tolerated procedure well. Patient to return in 1 week for follow-up.        Return call after MRI is completed to see me.

## 2020-10-13 ENCOUNTER — Telehealth (HOSPITAL_COMMUNITY): Payer: Self-pay

## 2020-10-13 ENCOUNTER — Other Ambulatory Visit (HOSPITAL_BASED_OUTPATIENT_CLINIC_OR_DEPARTMENT_OTHER): Payer: Self-pay

## 2020-10-13 NOTE — Telephone Encounter (Signed)
I called [name] [name] to let him know [name] would not be able to see him today. He did not answer so I left him a voicemail and asked that he call me if he has anything I can help him with over the phone.   [name], EMT 10/13/20

## 2020-10-14 ENCOUNTER — Ambulatory Visit: Payer: Medicaid Other | Admitting: Occupational Therapy

## 2020-10-14 ENCOUNTER — Ambulatory Visit: Payer: Medicaid Other | Admitting: Physical Therapy

## 2020-10-17 ENCOUNTER — Ambulatory Visit: Payer: Self-pay

## 2020-10-17 MED ORDER — AMLODIPINE BESYLATE 5 MG PO TABS: 5.0000 mg | ORAL_TABLET | Freq: Every day | ORAL | 3 refills | Status: DC

## 2020-10-17 NOTE — Addendum Note (Signed)
Addended by: Karle Plumber B on: 10/17/2020 09:24 PM   Modules accepted: Orders

## 2020-10-17 NOTE — Telephone Encounter (Signed)
Pt called to report elevated BP readings. Pt BP this am was 180/132. Pt stated his BP range 160-180/113. Pt denies, headache, chest pain, weakness or numbness to either side of face, arms or legs. No visual changes or weakness. Pt stated his BP med was changed from amlodipine to lisinopril-HCTZ . No appointments available until 11/10/20.  Sent Doristine Counter CMA a note regarding pt.  Care advice given and pt verbalized understanding.         Reason for Disposition  Systolic BP  >= 99991111 OR Diastolic >= A999333  Answer Assessment - Initial Assessment Questions 1. BLOOD PRESSURE: "What is the blood pressure?" "Did you take at least two measurements 5 minutes apart?"     180/132  2. ONSET: "When did you take your blood pressure?"     0920 3. HOW: "How did you obtain the blood pressure?" (e.g., visiting nurse, automatic home BP monitor)     Automatic BP monitor wrist 4. HISTORY: "Do you have a history of high blood pressure?"     yes 5. MEDICATIONS: "Are you taking any medications for blood pressure?" "Have you missed any doses recently?"     Yes/no 6. OTHER SYMPTOMS: "Do you have any symptoms?" (e.g., headache, chest pain, blurred vision, difficulty breathing, weakness)     no  Protocols used: Blood Pressure - High-A-AH

## 2020-10-18 NOTE — Telephone Encounter (Signed)
Called pharmacy unable to speak with the pharmacist , left VM with detailed info. Contact name and phone nr provided.

## 2020-10-19 ENCOUNTER — Ambulatory Visit: Payer: Medicaid Other | Admitting: Physical Therapy

## 2020-10-19 ENCOUNTER — Ambulatory Visit: Payer: Medicaid Other | Attending: Internal Medicine | Admitting: Occupational Therapy

## 2020-10-19 ENCOUNTER — Other Ambulatory Visit: Payer: Self-pay

## 2020-10-19 VITALS — BP 150/103 | HR 88

## 2020-10-19 DIAGNOSIS — R41842 Visuospatial deficit: Secondary | ICD-10-CM | POA: Insufficient documentation

## 2020-10-19 NOTE — Therapy (Signed)
[facility] [address] [place], [place], [address] Phone: [phone]   Fax:  [phone]  Occupational Therapy Arrive/No Charge  Patient Details  Name: [name] [name] MRN: [id number] Date of Birth: 1985/05/08 Referring Provider (OT): Karle Plumber, MD   Encounter Date: 10/19/2020    Past Medical History:  Diagnosis Date   Arthritis    "neck; maybe in my fingers" (11/06/2016)   Asthma    Bipolar disorder ([place])    "IED: intentional deficit disorder"   Depression    Heart murmur    "when I was a child"   Hypertension    Type II diabetes mellitus ([place])     Past Surgical History:  Procedure Laterality Date   AMPUTATION Left 11/05/2016   Procedure: AMPUTATION LEFT GREAT TOE;  Surgeon: Newt Minion, MD;  Location: [facility];  Service: Orthopedics;  Laterality: Left;   CATARACT EXTRACTION Left 07/2020   DRESSING CHANGE UNDER ANESTHESIA N/A 04/13/2016   Procedure: DRESSING CHANGE UNDER ANESTHESIA;  Surgeon: Johnathan Hausen, MD;  Location: [facility];  Service: General;  Laterality: N/A;   INCISION AND DRAINAGE ABSCESS Right 09/03/2015   Procedure: INCISION AND DRAINAGE ABSCESS;  Surgeon: Roseanne Kaufman, MD;  Location: [facility];  Service: Orthopedics;  Laterality: Right;   INCISION AND DRAINAGE PERIRECTAL ABSCESS N/A 04/11/2016   Procedure: IRRIGATION AND DEBRIDEMENT RECTAL ABSCESS;  Surgeon: Johnathan Hausen, MD;  Location: [facility];  Service: General;  Laterality: N/A;   [place]; 1989   x2    IRRIGATION AND DEBRIDEMENT ABSCESS Left 08/31/2015   Procedure: IRRIGATION AND DEBRIDEMENT ABSCESS LEFT SHOULDER;  Surgeon: Alphonsa Overall, MD;  Location: [facility];  Service: General;  Laterality: Left;   WOUND DEBRIDEMENT N/A 02/24/2016   Procedure: SACRAL WOUND DEBRIDEMENT;  Surgeon: Georganna Skeans, MD;  Location: [facility];  Service: General;  Laterality: N/A;    Vitals:   10/19/20 1159  BP: (!) 150/103  Pulse: 88      Subjective Assessment - 10/19/20 1152     Subjective  Pt arrived and reports he drove himself today. Pt did not have acceptable documentation for resuming therapy so was unable to see d/t procedure 7/14. Pt also had elevated blood pressure preventing being seen for therapy this day.    Pertinent History DM, HTN, R sided paralysis, visual deficits (cataracts, retinal damange)    Limitations Fall Risk, Visual deficits    Patient Stated Goals "better use out of this (right) hand. I was right handed before"              Pt arrived without documentation stating he could resume therapy s/p procedure on eye. Blood pressure was assessed and blood pressure to high for therapy. OT recommends contacting physician again regarding elevated blood pressure. Pt also reported driving to therapy today. OT advised patient that he needed proper documentation from a physician for resuming driving after procedure and neurological changes. Pt reported he was cleared by MD for driving but OT was not able to see any documentation stating this. OT recommends more formal evaluation of cognitive, vision and physical abilities for driving before resuming driving.                      OT Short Term Goals - 09/07/20 1322       OT SHORT TERM GOAL #1   Title Pt will be independent with HEP for ROM    Baseline Not issued at eval  Time 4    Period Weeks    Status New    Target Date 10/05/20      OT SHORT TERM GOAL #2   Title Pt will be independent with any braces and/or splints wear and care PRN.    Baseline not issued at eval    Time 4    Period Weeks    Status New      OT SHORT TERM GOAL #3   Title Pt will score 20 or greater on box and blocks with RUE for increase in functional use of RUE    Baseline R 16, L 53    Time 4    Period Weeks    Status New      OT SHORT TERM GOAL #4   Title Pt will perform simple warm meal prep and/or light housekeeping with supervision and good safety  awareness    Baseline some diff with manipulating items    Time 4    Period Weeks    Status New      OT SHORT TERM GOAL #5   Title Pt will demonstrate and/or report increased safety and stability with tub transfers for increasing safety with ADLs.    Baseline reports safety concerns with home set up/transfers    Time 4    Period Weeks    Status New               OT Long Term Goals - 09/07/20 1332       OT LONG TERM GOAL #1   Title Pt will be independent with updated HEP    Baseline not issued at eval    Time 12    Period Weeks    Status New    Target Date 11/30/20      OT LONG TERM GOAL #2   Title Pt will demonstrate active range of motion in RUE shoulder flexion of 120 degrees for increasing ability to reach and obtain lightweight object from shelf.    Baseline 105* RUE shoulder flex    Time 12    Period Weeks    Status New      OT LONG TERM GOAL #3   Title Pt will demonstrate improved wrist extension to neutral for increasing ability to reach and obtain an object.    Baseline -20 degrees    Time 12    Period Weeks    Status New      OT LONG TERM GOAL #4   Title Pt will increase grip strength in RUE to 15 lbs or greater for increase in functional use of RUE    Baseline R 10.5, L 84.8    Time 12    Period Weeks    Status New      OT LONG TERM GOAL #5   Title Pt will use RUE for functional stabilizer and assists for daily activities 50% of the time or greater.    Baseline using but not consistently.    Time 12    Period Weeks    Status New                    Patient will benefit from skilled therapeutic intervention in order to improve the following deficits and impairments:           Visit Diagnosis: No diagnosis found.    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[place])  08/10/2020   Hemiparesis of right dominant side due to  non-cerebrovascular etiology ([place]) 08/10/2020   Diabetic ulcer of left midfoot associated with diabetes mellitus due to underlying condition, limited to breakdown of skin ([place]) 08/10/2020   Cellulitis, leg 03/17/2020   Cellulitis of right leg 03/17/2020   Uncontrolled type 2 diabetes mellitus with complication, with long-term current use of insulin ([place])    Abscess    Cellulitis 12/05/2019   Cellulitis of left lower extremity    Abscess of left lower extremity    Uncontrolled type 2 diabetes mellitus with hyperglycemia (HCC)    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 2 diabetes mellitus ([place])    Cellulitis of foot, left 11/22/2016   Diabetic neuropathy ([place]) 11/22/2016   Fracture of 2nd metatarsal 11/22/2016   Fracture of 3rd metatarsal 11/22/2016   Postprocedural non-healing wound 11/22/2016   Acute cystitis with positive culture    Cutaneous abscess of left foot    Osteomyelitis ([place]) 11/03/2016   Poorly controlled type 2 diabetes mellitus with complication ([place]) [id number]   Hyperlipidemia 10/17/2016   Depression 10/17/2016   Neck pain    Asthma 04/11/2016   Uncontrolled type 2 diabetes mellitus with complication ([place])    Obesity, morbid ([place]) 09/06/2015   Hypertension associated with diabetes ([place]) 09/06/2015    Zachery Conch 10/19/2020, 12:24 PM  [facility] [address] [place], [place], [address] Phone: [phone]   Fax:  [phone]  Name: [name] [name] MRN: [id number] Date of Birth: January 07, 1986

## 2020-10-20 ENCOUNTER — Telehealth (HOSPITAL_COMMUNITY): Payer: Self-pay

## 2020-10-20 ENCOUNTER — Other Ambulatory Visit: Payer: Self-pay | Admitting: Internal Medicine

## 2020-10-20 ENCOUNTER — Other Ambulatory Visit: Payer: Self-pay

## 2020-10-20 MED ORDER — AMLODIPINE BESYLATE 5 MG PO TABS
5.0000 mg | ORAL_TABLET | Freq: Every day | ORAL | 3 refills | Status: DC
  Filled 2020-10-20: qty 30, 30d supply, fill #0

## 2020-10-20 NOTE — Telephone Encounter (Signed)
The class for the Rx amLODipine (NORVASC) 5 MG tablet says Print/ please resend refill to [facility]/ they never received it

## 2020-10-20 NOTE — Telephone Encounter (Signed)
Attempted to reach [name] [name] today with no success. I will continue to reach out.

## 2020-10-26 ENCOUNTER — Ambulatory Visit: Payer: Medicaid Other | Admitting: Occupational Therapy

## 2020-10-26 ENCOUNTER — Ambulatory Visit: Payer: Medicaid Other | Admitting: Physical Therapy

## 2020-10-27 ENCOUNTER — Other Ambulatory Visit (HOSPITAL_BASED_OUTPATIENT_CLINIC_OR_DEPARTMENT_OTHER): Payer: Self-pay

## 2020-10-27 ENCOUNTER — Telehealth (HOSPITAL_COMMUNITY): Payer: Self-pay

## 2020-10-27 MED ORDER — PREDNISOLONE ACETATE 1 % OP SUSP
OPHTHALMIC | 5 refills | Status: DC
  Filled 2020-10-27: qty 5, 25d supply, fill #0
  Filled 2020-11-24: qty 5, 18d supply, fill #0

## 2020-10-27 MED ORDER — OFLOXACIN 0.3 % OP SOLN
OPHTHALMIC | 3 refills | Status: DC
  Filled 2020-10-27: qty 5, 25d supply, fill #0
  Filled 2020-11-24: qty 5, 18d supply, fill #0

## 2020-10-27 NOTE — Telephone Encounter (Signed)
Left message for Mr. Guard to return my call to schedule a paramedicine home visit today. Will continue to reach out.

## 2020-10-28 ENCOUNTER — Other Ambulatory Visit: Payer: Self-pay

## 2020-11-02 ENCOUNTER — Ambulatory Visit: Payer: Medicaid Other | Admitting: Occupational Therapy

## 2020-11-02 ENCOUNTER — Ambulatory Visit: Payer: Medicaid Other | Admitting: Physical Therapy

## 2020-11-03 ENCOUNTER — Other Ambulatory Visit (HOSPITAL_COMMUNITY): Payer: Self-pay

## 2020-11-03 ENCOUNTER — Telehealth: Payer: Self-pay

## 2020-11-03 DIAGNOSIS — I152 Hypertension secondary to endocrine disorders: Secondary | ICD-10-CM

## 2020-11-03 DIAGNOSIS — E1159 Type 2 diabetes mellitus with other circulatory complications: Secondary | ICD-10-CM

## 2020-11-03 NOTE — Telephone Encounter (Signed)
[name] [name], EMT explained that patient has home wrist BP monitor that is not working and he would like a new one. He is not able to use the traditional arm cuff.   [name] is aware that insurance may not cover this but an order can be sent to [facility] to check coverage if Dr Wynetta Emery is in agreement.

## 2020-11-03 NOTE — Progress Notes (Signed)
Paramedicine Encounter    Patient ID: [name] [name], male    DOB: 02/11/1986, 34 y.o.   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[place]) 08/10/2020   Hemiparesis of right dominant side due to non-cerebrovascular etiology ([place]) 08/10/2020   Diabetic ulcer of left midfoot associated with diabetes mellitus due to underlying condition, limited to breakdown of skin ([place]) 08/10/2020   Cellulitis, leg 03/17/2020   Cellulitis of right leg 03/17/2020   Uncontrolled type 2 diabetes mellitus with complication, with long-term current use of insulin ([place])    Abscess    Cellulitis 12/05/2019   Cellulitis of left lower extremity    Abscess of left lower extremity    Uncontrolled type 2 diabetes mellitus with hyperglycemia (HCC)    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 2 diabetes mellitus ([place])    Cellulitis of foot, left 11/22/2016   Diabetic neuropathy ([place]) 11/22/2016   Fracture of 2nd metatarsal 11/22/2016   Fracture of 3rd metatarsal 11/22/2016   Postprocedural non-healing wound 11/22/2016   Acute cystitis with positive culture    Cutaneous abscess of left foot    Osteomyelitis ([place]) 11/03/2016   Poorly controlled type 2 diabetes mellitus with complication ([place]) 91/63/8466   Hyperlipidemia 10/17/2016   Depression 10/17/2016   Neck pain    Asthma 04/11/2016   Uncontrolled type 2 diabetes mellitus with complication (HCC)    Obesity, morbid ([place]) 09/06/2015   Hypertension associated with diabetes ([place]) 09/06/2015    Current Outpatient Medications:    Accu-Chek Softclix Lancets lancets, Check blood sugar BID. (Patient not taking: No sig reported), Disp: 100 each, Rfl: 2   Accu-Chek Softclix Lancets lancets, CHECK BLOOD SUGAR TWO TIMES DAILY (Patient not taking: No sig reported), Disp: 100 each, Rfl: 2   acetaminophen (TYLENOL)  325 MG tablet, Take 2 tablets (650 mg total) by mouth every 6 (six) hours as needed for mild pain or fever (for pain)., Disp: 10 tablet, Rfl: 0   amLODipine (NORVASC) 5 MG tablet, Take 1 tablet (5 mg total) by mouth daily., Disp: 30 tablet, Rfl: 3   Blood Glucose Monitoring Suppl (ACCU-CHEK GUIDE) w/Device KIT, 1 EACH BY DOES NOT APPLY ROUTE IN THE MORNING AND AT BEDTIME. (Patient not taking: No sig reported), Disp: 1 kit, Rfl: 0   Blood Glucose Monitoring Suppl (TRUE METRIX METER) w/Device KIT, 1 each by Does not apply route in the morning and at bedtime. (Patient not taking: No sig reported), Disp: 1 kit, Rfl: 0   Continuous Blood Gluc Receiver (DEXCOM G6 RECEIVER) DEVI, 1 Device by Does not apply route daily., Disp: 1 each, Rfl: 0   Continuous Blood Gluc Sensor (DEXCOM G6 SENSOR) MISC, 1 packet by Does not apply route daily., Disp: 3 each, Rfl: 11   Continuous Blood Gluc Transmit (DEXCOM G6 TRANSMITTER) MISC, USE AS DIRECTED DAILY, Disp: 1 each, Rfl: 4   Dulaglutide (TRULICITY) 5.99 JT/7.0VX SOPN, Inject 0.75 mg into the skin once a week., Disp: 2 mL, Rfl: 5   gabapentin (NEURONTIN) 300 MG capsule, Take 1 capsule (300 mg total) by mouth 2 (two) times daily., Disp: 180 capsule, Rfl: 1   glucose blood (TRUE METRIX BLOOD GLUCOSE TEST) test strip, Use as instructed (Patient not taking: No sig reported), Disp: 100 each, Rfl: 12   glucose blood test strip, USE AS INSTRUCTED (Patient not taking: No  sig reported), Disp: 100 strip, Rfl: 12   insulin detemir (LEVEMIR) 100 UNIT/ML FlexPen, Inject into the skin. (Patient not taking: Reported on 09/30/2020), Disp: , Rfl:    Insulin Pen Needle (PEN NEEDLES) 31G X 5 MM MISC, 1 each by Does not apply route in the morning and at bedtime., Disp: 100 each, Rfl: 2   Insulin Pen Needle 31G X 5 MM MISC, 1 EACH BY DOES NOT APPLY ROUTE IN THE MORNING AND AT BEDTIME., Disp: 100 each, Rfl: 2   ketorolac (ACULAR) 0.5 % ophthalmic solution, Place 1 drop into the right eye 4  (four) times daily. start after surgery, Disp: 10 mL, Rfl: 1   lisinopril-hydrochlorothiazide (ZESTORETIC) 10-12.5 MG tablet, Take 1 tablet by mouth daily., Disp: 30 tablet, Rfl: 5   metFORMIN (GLUCOPHAGE) 500 MG tablet, Take 2 tablets (1,000 mg total) by mouth daily with breakfast., Disp: 180 tablet, Rfl: 1   mupirocin ointment (BACTROBAN) 2 %, Apply 1 application topically daily., Disp: 30 g, Rfl: 2   ofloxacin (OCUFLOX) 0.3 % ophthalmic solution, Instill 1 drop in left eye four times a day; Begin one day after surgery, continue as directed., Disp: 5 mL, Rfl: 5   ofloxacin (OCUFLOX) 0.3 % ophthalmic solution, Instill 1 drop in left eye four times a day; Begin one day after surgery, Continue as directed., Disp: 10 mL, Rfl: 5   ofloxacin (OCUFLOX) 0.3 % ophthalmic solution, Instill 1 drop in right eye four times a day; Begin one day after surgery and continue as directed, Disp: 5 mL, Rfl: 3   pravastatin (PRAVACHOL) 20 MG tablet, TAKE 1 TABLET (20 MG TOTAL) BY MOUTH DAILY., Disp: 90 tablet, Rfl: 1   prednisoLONE acetate (PRED FORTE) 1 % ophthalmic suspension, Instill 1 drop in left eye four times a day; Begin one day after surgery, continue as directed., Disp: 5 mL, Rfl: 5   prednisoLONE acetate (PRED FORTE) 1 % ophthalmic suspension, Instill 1 drop in left eye four times a day, Disp: 5 mL, Rfl: 2   prednisoLONE acetate (PRED FORTE) 1 % ophthalmic suspension, Instill 1 drop in right eye four times a day; Begin one day after surgery, continue as directed, Disp: 15 mL, Rfl: 5   sulfamethoxazole-trimethoprim (BACTRIM DS) 800-160 MG tablet, Take 1 tablet by mouth 2 (two) times daily., Disp: 14 tablet, Rfl: 0 No Known Allergies   Social History   Socioeconomic History   Marital status: Single    Spouse name: Not on file   Number of children: Not on file   Years of education: Not on file   Highest education level: Not on file  Occupational History   Not on file  Tobacco Use   Smoking status:  Never   Smokeless tobacco: Never  Vaping Use   Vaping Use: Never used  Substance and Sexual Activity   Alcohol use: Yes    Comment: 11/06/2016 "I'll drink a little at parties; maybe 5 times/year"   Drug use: Yes    Types: Marijuana    Comment: 11/06/2016 "stopped ~ 1 month ago"   Sexual activity: Never    Birth control/protection: Diaphragm  Other Topics Concern   Not on file  Social History Narrative   Not on file   Social Determinants of Health   Financial Resource Strain: Not on file  Food Insecurity: Not on file  Transportation Needs: Not on file  Physical Activity: Not on file  Stress: Not on file  Social Connections: Not on file  Intimate Partner Violence:  Not on file    Physical Exam Vitals reviewed.  Constitutional:      Appearance: Normal appearance. He is normal weight.  HENT:     Head: Normocephalic.     Nose: Nose normal.     Mouth/Throat:     Mouth: Mucous membranes are moist.     Pharynx: Oropharynx is clear.  Cardiovascular:     Rate and Rhythm: Normal rate and regular rhythm.     Pulses: Normal pulses.     Heart sounds: Normal heart sounds.  Pulmonary:     Effort: Pulmonary effort is normal.     Breath sounds: Normal breath sounds.  Abdominal:     General: Abdomen is flat.     Palpations: Abdomen is soft.  Musculoskeletal:        General: No swelling. Normal range of motion.     Cervical back: Normal range of motion.     Right lower leg: No edema.     Left lower leg: No edema.  Skin:    General: Skin is warm and dry.     Capillary Refill: Capillary refill takes less than 2 seconds.  Neurological:     General: No focal deficit present.     Mental Status: He is alert. Mental status is at baseline.  Psychiatric:        Mood and Affect: Mood normal.    Arrived for home visit for Jakaiden who was alert and oriented reporting to be feeling good. He states he has had no dizziness, chest pain, shortness of breath. He states sometimes he gets headaches  so he takes Exedrin for same. He did admit to missing a few days of his medications and he has not yet had them today upon our visit. Kavian reports he is checking his sugar daily and taking his trulicity weekly. He states his wrist BP cuff he got is no longer working so he hasn't been able to check his BP's. I assessed same and changes out batteries and BP cuff still did not work. I requested a new RX be sent to pharmacy for same. We reviewed medications and verified same. I will ensure all medications get sent to [facility].   I plan to see Rehan in one week in the home and then again in clinic at [facility] on Sept. 1st at 3:50. We will set up transportation for same next week.  SDOH: No social determinants needing to be met at this time. Robbie reports he is good with his bills, rent and using [facility] [employer] and reports having food without issue.   Refills: Amlodipine Lisinopril Pravastatin    Future Appointments  Date Time Provider [place]  11/17/2020  3:50 PM Ladell Pier, MD [facility] None     ACTION: Home visit completed

## 2020-11-04 ENCOUNTER — Other Ambulatory Visit: Payer: Self-pay | Admitting: Internal Medicine

## 2020-11-04 MED ORDER — AMLODIPINE BESYLATE 5 MG PO TABS: 5.0000 mg | ORAL_TABLET | Freq: Every day | ORAL | 3 refills | Status: DC

## 2020-11-04 NOTE — Telephone Encounter (Signed)
Rx has been faxed.

## 2020-11-08 ENCOUNTER — Other Ambulatory Visit (HOSPITAL_BASED_OUTPATIENT_CLINIC_OR_DEPARTMENT_OTHER): Payer: Self-pay

## 2020-11-10 ENCOUNTER — Telehealth (HOSPITAL_COMMUNITY): Payer: Self-pay

## 2020-11-10 NOTE — Telephone Encounter (Signed)
I called Mr. [name] [name] to advise that [name] [name] would not be coming out today due to a family emergency. He did not answer so I left a message with my information and requested he call me back when he is available.   [name], EMT 11/10/20

## 2020-11-17 ENCOUNTER — Ambulatory Visit: Payer: Medicaid Other | Attending: Internal Medicine | Admitting: Internal Medicine

## 2020-11-17 ENCOUNTER — Other Ambulatory Visit: Payer: Self-pay

## 2020-11-17 ENCOUNTER — Telehealth (HOSPITAL_COMMUNITY): Payer: Self-pay

## 2020-11-17 DIAGNOSIS — L97421 Non-pressure chronic ulcer of left heel and midfoot limited to breakdown of skin: Secondary | ICD-10-CM

## 2020-11-17 DIAGNOSIS — E1159 Type 2 diabetes mellitus with other circulatory complications: Secondary | ICD-10-CM | POA: Diagnosis not present

## 2020-11-17 DIAGNOSIS — E113299 Type 2 diabetes mellitus with mild nonproliferative diabetic retinopathy without macular edema, unspecified eye: Secondary | ICD-10-CM

## 2020-11-17 DIAGNOSIS — E1121 Type 2 diabetes mellitus with diabetic nephropathy: Secondary | ICD-10-CM | POA: Diagnosis not present

## 2020-11-17 DIAGNOSIS — R079 Chest pain, unspecified: Secondary | ICD-10-CM

## 2020-11-17 DIAGNOSIS — Z2821 Immunization not carried out because of patient refusal: Secondary | ICD-10-CM

## 2020-11-17 DIAGNOSIS — I152 Hypertension secondary to endocrine disorders: Secondary | ICD-10-CM

## 2020-11-17 DIAGNOSIS — E08621 Diabetes mellitus due to underlying condition with foot ulcer: Secondary | ICD-10-CM

## 2020-11-17 NOTE — Telephone Encounter (Signed)
Left message for [name] [name] to return my call for scheduling a home visit and to remind him of his appointment today with Dr. Wynetta Emery. I will continue to reach out.

## 2020-11-17 NOTE — Progress Notes (Signed)
Patient ID: [name] [name], male   DOB: January 30, 1986, 35 y.o.   MRN: [id number] Virtual Visit via Telephone Note  I connected with [name] [name] on 11/17/2020 at 3:42 p.m by telephone and verified that I am speaking with the correct person using two identifiers  Location: Patient: home Provider: office  Participants: Myself Patient   I discussed the limitations, risks, security and privacy concerns of performing an evaluation and management service by telephone and the availability of in person appointments. I also discussed with the patient that there may be a patient responsible charge related to this service. The patient expressed understanding and agreed to proceed.   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  Last seen 07/2020.   DM: -Had series of surgery on eyes.  Had cataract surgery BL and retina surgery x 2 on LT and one on RT eye yesterday. Seeing a lot better out of LT eye.  RT eye too early to tell -seeing Dr. Sherryle [name], the podiatrist, for LT foot ulcer.  Reports ulcer healing very slowing.  Needs to have surgery in heel but Dr. Sherryle [name] wants to see A1C first -has Dexcom.  BS running around 150. Compliant with Trulicity and Metformin -has CMA who comes once a day for 2 hrs and helps with meal preps  HYPERTENSION Currently taking: see medication list.  Taking Lisinopril/HCTZ and Norvasc Med Adherence: '[x]'  Yes    '[]'  No Medication side effects: '[]'  Yes    '[x]'  No Adherence with salt restriction: '[x]'  Yes    '[]'  No Home Monitoring?: just got a new arm device and trying to learn how to use it.  EMT [name] [name] sees him once a wk but she did not come this wk due to family issues. Reports he was told  yesterday before his eye surgery that his BP was good.   Monitoring Frequency:  Home BP results range:  SOB? '[]'  Yes    '[x]'  No Chest Pain?: '[x]'  Yes -sharp pain at times RT posterior thoracic below shoulder blade.  Feels "like I have been punched very hard." Occurs about 3 times a wk and last a few hrs. Has been going on for over 2 mths.  No cough.  Reports O2 level was good yesterday before surgery.     Leg swelling?: '[]'  Yes    '[x]'  No Headaches?: '[]'  Yes    '[x]'  No Dizziness? '[x]'  Yes - if he gets up to quickly    HL:  taking and tolerating pravastatin  HM:  Declines flu and pneumonia vaccine.  He also declines COVID vaccine Outpatient Encounter Medications as of 11/17/2020  Medication Sig Note   Accu-Chek Softclix Lancets lancets Check blood sugar BID. (Patient not taking: No sig reported)    Accu-Chek Softclix Lancets lancets CHECK BLOOD SUGAR TWO TIMES DAILY (Patient not taking: No sig reported)    acetaminophen (TYLENOL) 325 MG tablet Take 2 tablets (650 mg total) by mouth every 6 (six) hours as needed for mild pain or fever (for pain).    amLODipine (NORVASC) 5 MG tablet Take 1 tablet (5 mg total) by mouth daily.    Blood Glucose Monitoring Suppl (ACCU-CHEK GUIDE) w/Device KIT 1 EACH BY DOES NOT APPLY ROUTE IN THE MORNING AND AT BEDTIME. (Patient  not taking: No sig reported)    Blood Glucose Monitoring Suppl (TRUE METRIX METER) w/Device KIT 1 each by Does not apply route in the morning and at bedtime. (Patient not taking: No sig reported)    Continuous Blood Gluc Receiver (DEXCOM G6 RECEIVER) DEVI 1 Device by Does not apply route daily.    Continuous Blood Gluc Sensor (DEXCOM G6 SENSOR) MISC 1 packet by Does not apply route daily.    Continuous Blood Gluc Transmit (DEXCOM G6 TRANSMITTER) MISC USE AS DIRECTED DAILY    Dulaglutide (TRULICITY) 0.53 ZJ/6.7HA SOPN Inject 0.75 mg into the skin once a week.    gabapentin (NEURONTIN) 300 MG capsule Take 1 capsule (300 mg total) by mouth 2 (two) times  daily. 09/30/2020: Taking two capsules once daily.    glucose blood (TRUE METRIX BLOOD GLUCOSE TEST) test strip Use as instructed    glucose blood test strip USE AS INSTRUCTED (Patient not taking: No sig reported)    insulin detemir (LEVEMIR) 100 UNIT/ML FlexPen Inject into the skin. (Patient not taking: No sig reported)    Insulin Pen Needle (PEN NEEDLES) 31G X 5 MM MISC 1 each by Does not apply route in the morning and at bedtime. (Patient not taking: Reported on 11/03/2020)    Insulin Pen Needle 31G X 5 MM MISC 1 EACH BY DOES NOT APPLY ROUTE IN THE MORNING AND AT BEDTIME. (Patient not taking: Reported on 11/03/2020)    ketorolac (ACULAR) 0.5 % ophthalmic solution Place 1 drop into the right eye 4 (four) times daily. start after surgery    lisinopril-hydrochlorothiazide (ZESTORETIC) 10-12.5 MG tablet Take 1 tablet by mouth daily.    metFORMIN (GLUCOPHAGE) 500 MG tablet Take 2 tablets (1,000 mg total) by mouth daily with breakfast.    mupirocin ointment (BACTROBAN) 2 % Apply 1 application topically daily.    ofloxacin (OCUFLOX) 0.3 % ophthalmic solution Instill 1 drop in left eye four times a day; Begin one day after surgery, continue as directed.    ofloxacin (OCUFLOX) 0.3 % ophthalmic solution Instill 1 drop in left eye four times a day; Begin one day after surgery, Continue as directed.    ofloxacin (OCUFLOX) 0.3 % ophthalmic solution Instill 1 drop in right eye four times a day; Begin one day after surgery and continue as directed    pravastatin (PRAVACHOL) 20 MG tablet TAKE 1 TABLET (20 MG TOTAL) BY MOUTH DAILY.    prednisoLONE acetate (PRED FORTE) 1 % ophthalmic suspension Instill 1 drop in left eye four times a day; Begin one day after surgery, continue as directed.    prednisoLONE acetate (PRED FORTE) 1 % ophthalmic suspension Instill 1 drop in left eye four times a day    prednisoLONE acetate (PRED FORTE) 1 % ophthalmic suspension Instill 1 drop in right eye four times a day; Begin one day  after surgery, continue as directed    sulfamethoxazole-trimethoprim (BACTRIM DS) 800-160 MG tablet Take 1 tablet by mouth 2 (two) times daily. (Patient not taking: Reported on 11/03/2020)    No facility-administered encounter medications on file as of 11/17/2020.      Observations/Objective: No direct observation done as this was a telephone encounter  Assessment and Plan: 1. Type 2 diabetes mellitus with background retinopathy ([place]) Vision gradually improving from recent surgeries. Blood sugars have improved since being on Trulicity and metformin.  He will come to the lab tomorrow to have blood test done to check A1c.  Discussed and encourage healthy eating habits. - Hemoglobin A1c;  Future - Microalbumin / creatinine urine ratio; Future - Basic Metabolic Panel; Future  2. Hypertension associated with diabetes ([place]) Advised to tell the EMT worker when she comes next week to notify me if his blood pressures are running high.  I also told him that she can show him how to use his blood pressure monitoring device so that he is able to check it himself.  Continue Norvasc and lisinopril/HCTZ.  3. Macroalbuminuric diabetic nephropathy (HCC) We will recheck urine when he comes to the lab tomorrow to see whether the amount of protein in the urine has decreased with him being on lisinopril more consistently  4. Chest pain in adult Questionable etiology.  We will do a chest x-ray to rule out any gross lung pathology.  Does not sound like the pain is pleuritic. - DG Chest 2 View; Future  5. Diabetic ulcer of left midfoot associated with diabetes mellitus due to underlying condition, limited to breakdown of skin ([place]) Followed by podiatry.  Patient reports ulcer is healing slowly  6. Influenza vaccination declined Recommended.  Patient declined.  7. 23-polyvalent pneumococcal polysaccharide vaccine declined Recommended.  Patient declined.  8. COVID-19 vaccination declined Recommended.  Patient  declined.   Follow Up Instructions: 2 mths   I discussed the assessment and treatment plan with the patient. The patient was provided an opportunity to ask questions and all were answered. The patient agreed with the plan and demonstrated an understanding of the instructions.   The patient was advised to call back or seek an in-person evaluation if the symptoms worsen or if the condition fails to improve as anticipated.  I  Spent 22 minutes on this telephone encounter  Karle Plumber, MD

## 2020-11-23 ENCOUNTER — Telehealth: Payer: Self-pay

## 2020-11-23 NOTE — Telephone Encounter (Signed)
Pt has been scheduled and reminder has been mailed.  

## 2020-11-23 NOTE — Telephone Encounter (Signed)
-----   Message from Ladell Pier, MD sent at 11/17/2020  5:37 PM EDT ----- Give follow-up appointment for 2 months.

## 2020-11-24 ENCOUNTER — Ambulatory Visit: Payer: Medicaid Other | Attending: Internal Medicine

## 2020-11-24 ENCOUNTER — Other Ambulatory Visit: Payer: Self-pay

## 2020-11-24 NOTE — Addendum Note (Signed)
Addended bySteffanie Dunn on: 11/24/2020 01:54 PM   Modules accepted: Orders

## 2020-11-25 LAB — BASIC METABOLIC PANEL
BUN/Creatinine Ratio: 17 (ref 9–20)
BUN: 35 mg/dL — ABNORMAL HIGH (ref 6–20)
CO2: 22 mmol/L (ref 20–29)
Calcium: 9.3 mg/dL (ref 8.7–10.2)
Chloride: 105 mmol/L (ref 96–106)
Creatinine, Ser: 2.12 mg/dL — ABNORMAL HIGH (ref 0.76–1.27)
Glucose: 151 mg/dL — ABNORMAL HIGH (ref 65–99)
Potassium: 4.6 mmol/L (ref 3.5–5.2)
Sodium: 141 mmol/L (ref 134–144)
eGFR: 41 mL/min/{1.73_m2} — ABNORMAL LOW (ref >59)

## 2020-11-25 LAB — HEMOGLOBIN A1C
Est. average glucose Bld gHb Est-mCnc: 143 mg/dL
Hgb A1c MFr Bld: 6.6 % — ABNORMAL HIGH (ref 4.8–5.6)

## 2020-11-25 LAB — MICROALBUMIN / CREATININE URINE RATIO
Creatinine, Urine: 152.6 mg/dL
Microalb/Creat Ratio: 2368 mg/g creat — ABNORMAL HIGH (ref 0–29)
Microalbumin, Urine: 3614 ug/mL

## 2020-11-26 ENCOUNTER — Other Ambulatory Visit: Payer: Self-pay | Admitting: Internal Medicine

## 2020-11-26 DIAGNOSIS — E1121 Type 2 diabetes mellitus with diabetic nephropathy: Secondary | ICD-10-CM

## 2020-11-26 NOTE — Progress Notes (Signed)
Let patient know that he still has a large amount of protein in the urine but it has decreased significantly compared to several months ago.  His kidney function is not 100%.  Recommend drinking several glasses of water daily.  Avoid taking over-the-counter pain medications.  I will refer him to a nephrologist because of the kidney function and protein in the urine.  His A1c is 6.6 which has significantly improved from previous level of 10.9.  This means his diabetes is under good control.

## 2020-11-29 ENCOUNTER — Telehealth: Payer: Self-pay

## 2020-11-29 NOTE — Telephone Encounter (Signed)
Contacted pt to go over lab results pt is aware and doesn't have any questions or concerns 

## 2020-12-01 ENCOUNTER — Telehealth (HOSPITAL_COMMUNITY): Payer: Self-pay

## 2020-12-01 ENCOUNTER — Other Ambulatory Visit: Payer: Self-pay

## 2020-12-01 DIAGNOSIS — E1142 Type 2 diabetes mellitus with diabetic polyneuropathy: Secondary | ICD-10-CM

## 2020-12-01 DIAGNOSIS — E1165 Type 2 diabetes mellitus with hyperglycemia: Secondary | ICD-10-CM

## 2020-12-01 DIAGNOSIS — IMO0002 Reserved for concepts with insufficient information to code with codable children: Secondary | ICD-10-CM

## 2020-12-01 DIAGNOSIS — E113299 Type 2 diabetes mellitus with mild nonproliferative diabetic retinopathy without macular edema, unspecified eye: Secondary | ICD-10-CM

## 2020-12-01 DIAGNOSIS — E1121 Type 2 diabetes mellitus with diabetic nephropathy: Secondary | ICD-10-CM

## 2020-12-01 DIAGNOSIS — I152 Hypertension secondary to endocrine disorders: Secondary | ICD-10-CM

## 2020-12-01 MED ORDER — PRAVASTATIN SODIUM 20 MG PO TABS: ORAL_TABLET | Freq: Every day | ORAL | 1 refills | Status: DC

## 2020-12-01 MED ORDER — GABAPENTIN 300 MG PO CAPS: 300.0000 mg | ORAL_CAPSULE | Freq: Two times a day (BID) | ORAL | 1 refills | Status: DC

## 2020-12-01 MED ORDER — LISINOPRIL-HYDROCHLOROTHIAZIDE 10-12.5 MG PO TABS: 1.0000 | ORAL_TABLET | Freq: Every day | ORAL | 1 refills | Status: DC

## 2020-12-01 MED ORDER — AMLODIPINE BESYLATE 5 MG PO TABS: 5.0000 mg | ORAL_TABLET | Freq: Every day | ORAL | 3 refills | Status: DC

## 2020-12-01 MED ORDER — METFORMIN HCL 500 MG PO TABS: 1000.0000 mg | ORAL_TABLET | Freq: Every day | ORAL | 1 refills | Status: DC

## 2020-12-01 MED ORDER — TRULICITY 0.75 MG/0.5ML ~~LOC~~ SOAJ: 0.7500 mg | SUBCUTANEOUS | 5 refills | Status: DC

## 2020-12-01 NOTE — Addendum Note (Signed)
Addended by: Karle Plumber B on: 12/01/2020 09:32 PM   Modules accepted: Orders

## 2020-12-01 NOTE — Telephone Encounter (Signed)
[name] [name] informed me he will be traveling to [facility] to visit family late October through late December and will need 90 day supply of his medications including his trulicity. I will forward this information to Dr. Wynetta Emery, [name] [name] primary care doctor for further. Call complete.

## 2020-12-01 NOTE — Progress Notes (Signed)
Paramedicine Encounter    Patient ID: [name] [name], male    DOB: 30-Jan-1986, 35 y.o.   MRN: [id number]   Patient Care Team: Ladell Pier, MD as PCP - General (Internal Medicine)  Patient Active Problem List   Diagnosis Date Noted   Abnormality of gait 10/11/2020   Macroalbuminuric diabetic nephropathy ([place]) 08/10/2020   Hemiparesis of right dominant side due to non-cerebrovascular etiology ([place]) 08/10/2020   Diabetic ulcer of left midfoot associated with diabetes mellitus due to underlying condition, limited to breakdown of skin ([place]) 08/10/2020   Cellulitis, leg 03/17/2020   Cellulitis of right leg 03/17/2020   Uncontrolled type 2 diabetes mellitus with complication, with long-term current use of insulin ([place])    Abscess    Cellulitis 12/05/2019   Cellulitis of left lower extremity    Abscess of left lower extremity    Uncontrolled type 2 diabetes mellitus with hyperglycemia (HCC)    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 2 diabetes mellitus ([place])    Cellulitis of foot, left 11/22/2016   Diabetic neuropathy ([place]) 11/22/2016   Fracture of 2nd metatarsal 11/22/2016   Fracture of 3rd metatarsal 11/22/2016   Postprocedural non-healing wound 11/22/2016   Acute cystitis with positive culture    Cutaneous abscess of left foot    Osteomyelitis ([place]) 11/03/2016   Poorly controlled type 2 diabetes mellitus with complication ([place]) 00/17/4944   Hyperlipidemia 10/17/2016   Depression 10/17/2016   Neck pain    Asthma 04/11/2016   Uncontrolled type 2 diabetes mellitus with complication (HCC)    Obesity, morbid ([place]) 09/06/2015   Hypertension associated with diabetes ([place]) 09/06/2015    Current Outpatient Medications:    acetaminophen (TYLENOL) 325 MG tablet, Take 2 tablets (650 mg total) by mouth every 6 (six) hours as needed for mild pain or fever (for pain)., Disp: 10 tablet, Rfl: 0   amLODipine (NORVASC) 5 MG tablet, Take 1 tablet (5 mg total) by mouth daily., Disp: 90 tablet, Rfl: 3   Blood  Glucose Monitoring Suppl (TRUE METRIX METER) w/Device KIT, 1 each by Does not apply route in the morning and at bedtime. (Patient not taking: No sig reported), Disp: 1 kit, Rfl: 0   Continuous Blood Gluc Receiver (DEXCOM G6 RECEIVER) DEVI, 1 Device by Does not apply route daily., Disp: 1 each, Rfl: 0   Continuous Blood Gluc Sensor (DEXCOM G6 SENSOR) MISC, 1 packet by Does not apply route daily., Disp: 3 each, Rfl: 11   Continuous Blood Gluc Transmit (DEXCOM G6 TRANSMITTER) MISC, USE AS DIRECTED DAILY, Disp: 1 each, Rfl: 4   Dulaglutide (TRULICITY) 9.67 RF/1.6BW SOPN, Inject 0.75 mg into the skin once a week., Disp: 2 mL, Rfl: 5   gabapentin (NEURONTIN) 300 MG capsule, Take 1 capsule (300 mg total) by mouth 2 (two) times daily., Disp: 180 capsule, Rfl: 1   glucose blood (TRUE METRIX BLOOD GLUCOSE TEST) test strip, Use as instructed, Disp: 100 each, Rfl: 12   glucose blood test strip, USE AS INSTRUCTED (Patient not taking: No sig reported), Disp: 100 strip, Rfl: 12   insulin detemir (LEVEMIR) 100 UNIT/ML FlexPen, Inject into the skin. (Patient not taking: No sig reported), Disp: , Rfl:    Insulin Pen Needle (PEN NEEDLES) 31G X 5 MM MISC, 1 each by Does not apply route in the morning and at bedtime. (Patient not taking: Reported on 11/03/2020), Disp: 100 each, Rfl: 2   Insulin Pen Needle 31G X 5 MM MISC, 1 EACH BY DOES NOT APPLY ROUTE IN THE  MORNING AND AT BEDTIME. (Patient not taking: Reported on 11/03/2020), Disp: 100 each, Rfl: 2   ketorolac (ACULAR) 0.5 % ophthalmic solution, Place 1 drop into the right eye 4 (four) times daily. start after surgery, Disp: 10 mL, Rfl: 1   lisinopril-hydrochlorothiazide (ZESTORETIC) 10-12.5 MG tablet, Take 1 tablet by mouth daily., Disp: 30 tablet, Rfl: 5   metFORMIN (GLUCOPHAGE) 500 MG tablet, Take 2 tablets (1,000 mg total) by mouth daily with breakfast., Disp: 180 tablet, Rfl: 1   mupirocin ointment (BACTROBAN) 2 %, Apply 1 application topically daily., Disp: 30 g,  Rfl: 2   ofloxacin (OCUFLOX) 0.3 % ophthalmic solution, Instill 1 drop in left eye four times a day; Begin one day after surgery, continue as directed., Disp: 5 mL, Rfl: 5   ofloxacin (OCUFLOX) 0.3 % ophthalmic solution, Instill 1 drop in left eye four times a day; Begin one day after surgery, Continue as directed., Disp: 10 mL, Rfl: 5   ofloxacin (OCUFLOX) 0.3 % ophthalmic solution, Instill 1 drop in right eye four times a day; Begin one day after surgery and continue as directed, Disp: 5 mL, Rfl: 3   pravastatin (PRAVACHOL) 20 MG tablet, TAKE 1 TABLET (20 MG TOTAL) BY MOUTH DAILY., Disp: 90 tablet, Rfl: 1   prednisoLONE acetate (PRED FORTE) 1 % ophthalmic suspension, Instill 1 drop in left eye four times a day; Begin one day after surgery, continue as directed., Disp: 5 mL, Rfl: 5   prednisoLONE acetate (PRED FORTE) 1 % ophthalmic suspension, Instill 1 drop in left eye four times a day, Disp: 5 mL, Rfl: 2   prednisoLONE acetate (PRED FORTE) 1 % ophthalmic suspension, Instill 1 drop in right eye four times a day; Begin one day after surgery, continue as directed, Disp: 15 mL, Rfl: 5 No Known Allergies   Social History   Socioeconomic History   Marital status: Single    Spouse name: Not on file   Number of children: Not on file   Years of education: Not on file   Highest education level: Not on file  Occupational History   Not on file  Tobacco Use   Smoking status: Never   Smokeless tobacco: Never  Vaping Use   Vaping Use: Never used  Substance and Sexual Activity   Alcohol use: Yes    Comment: 11/06/2016 "I'll drink a little at parties; maybe 5 times/year"   Drug use: Yes    Types: Marijuana    Comment: 11/06/2016 "stopped ~ 1 month ago"   Sexual activity: Never    Birth control/protection: Diaphragm  Other Topics Concern   Not on file  Social History Narrative   Not on file   Social Determinants of Health   Financial Resource Strain: Not on file  Food Insecurity: Not on file   Transportation Needs: Not on file  Physical Activity: Not on file  Stress: Not on file  Social Connections: Not on file  Intimate Partner Violence: Not on file    Physical Exam Vitals reviewed.  Constitutional:      Appearance: Normal appearance. He is normal weight.  HENT:     Head: Normocephalic.     Nose: Nose normal.     Mouth/Throat:     Mouth: Mucous membranes are moist.     Pharynx: Oropharynx is clear.  Eyes:     Conjunctiva/sclera: Conjunctivae normal.     Pupils: Pupils are equal, round, and reactive to light.  Cardiovascular:     Rate and Rhythm: Normal rate  and regular rhythm.     Pulses: Normal pulses.     Heart sounds: Normal heart sounds.  Abdominal:     General: Abdomen is flat.     Palpations: Abdomen is soft.  Musculoskeletal:        General: No swelling. Normal range of motion.     Cervical back: Normal range of motion.     Right lower leg: No edema.     Left lower leg: No edema.  Skin:    General: Skin is warm and dry.     Capillary Refill: Capillary refill takes less than 2 seconds.  Neurological:     General: No focal deficit present.     Mental Status: He is alert. Mental status is at baseline.  Psychiatric:        Mood and Affect: Mood normal.   Arrived for home visit for [name] [name] who reports feeling good with no complaints. He has been taking his medications and denied any headaches, chest pain, dizziness, shortness of breath or trouble sleeping.   [name] [name] recently had eye surgery and he reports it is healing well with his vision being almost back. He is compliant with his eye drops.   [name] [name] received a new blood pressure cuff and states his blood pressures have been "okay" but has not been keeping a log. I asked if he was able to do so, so we can compare. He agreed.   Meds were reviewed. [name] [name] advised he is going out of town at the end of October and not returning until late December and will need 90 day supply of his medications. I will request  same from Dr. Wynetta Emery.   CBG today is 123 after meals and medications 30 mins prior. (1530)  BP 160/100 after meds 30 mins prior. (1530)  Appointments reviewed and confirmed.   Home visit complete. I will see [name] [name] in two to four weeks.   Refills: ALL MEDS NEEDING 90 DAY SUPPLY ON NEXT FILL   2 TRULICITY LEFT      Future Appointments  Date Time Provider [place]  12/19/2020  2:10 PM Ladell Pier, MD [id number] None     ACTION: Home visit completed

## 2020-12-14 ENCOUNTER — Telehealth (HOSPITAL_COMMUNITY): Payer: Self-pay

## 2020-12-14 NOTE — Telephone Encounter (Signed)
Requested bubble packs for [name] [name] with Christy Sartorius at [employer]. They will be delivered to [place] once completed. Call complete.

## 2020-12-19 ENCOUNTER — Ambulatory Visit (HOSPITAL_COMMUNITY)
Admission: RE | Admit: 2020-12-19 | Discharge: 2020-12-19 | Disposition: A | Discharge status: Home / Self Care | Payer: Medicaid Other | Source: Ambulatory Visit | Attending: Internal Medicine | Admitting: Internal Medicine

## 2020-12-19 ENCOUNTER — Encounter: Payer: Self-pay | Admitting: Internal Medicine

## 2020-12-19 ENCOUNTER — Ambulatory Visit (HOSPITAL_BASED_OUTPATIENT_CLINIC_OR_DEPARTMENT_OTHER): Payer: Medicaid Other | Admitting: Internal Medicine

## 2020-12-19 ENCOUNTER — Other Ambulatory Visit: Payer: Self-pay

## 2020-12-19 VITALS — BP 160/90 | HR 101 | Resp 16 | Wt 224.6 lb

## 2020-12-19 DIAGNOSIS — N1831 Chronic kidney disease, stage 3a: Secondary | ICD-10-CM | POA: Insufficient documentation

## 2020-12-19 DIAGNOSIS — Z7985 Long-term (current) use of injectable non-insulin antidiabetic drugs: Secondary | ICD-10-CM | POA: Insufficient documentation

## 2020-12-19 DIAGNOSIS — Z7984 Long term (current) use of oral hypoglycemic drugs: Secondary | ICD-10-CM | POA: Insufficient documentation

## 2020-12-19 DIAGNOSIS — I152 Hypertension secondary to endocrine disorders: Secondary | ICD-10-CM

## 2020-12-19 DIAGNOSIS — R079 Chest pain, unspecified: Secondary | ICD-10-CM | POA: Insufficient documentation

## 2020-12-19 DIAGNOSIS — Y828 Other medical devices associated with adverse incidents: Secondary | ICD-10-CM | POA: Insufficient documentation

## 2020-12-19 DIAGNOSIS — I129 Hypertensive chronic kidney disease with stage 1 through stage 4 chronic kidney disease, or unspecified chronic kidney disease: Secondary | ICD-10-CM | POA: Insufficient documentation

## 2020-12-19 DIAGNOSIS — Z89422 Acquired absence of other left toe(s): Secondary | ICD-10-CM | POA: Insufficient documentation

## 2020-12-19 DIAGNOSIS — M9689 Other intraoperative and postprocedural complications and disorders of the musculoskeletal system: Secondary | ICD-10-CM | POA: Insufficient documentation

## 2020-12-19 DIAGNOSIS — E1121 Type 2 diabetes mellitus with diabetic nephropathy: Secondary | ICD-10-CM | POA: Diagnosis not present

## 2020-12-19 DIAGNOSIS — E1159 Type 2 diabetes mellitus with other circulatory complications: Secondary | ICD-10-CM | POA: Diagnosis not present

## 2020-12-19 DIAGNOSIS — G8191 Hemiplegia, unspecified affecting right dominant side: Secondary | ICD-10-CM | POA: Insufficient documentation

## 2020-12-19 DIAGNOSIS — F319 Bipolar disorder, unspecified: Secondary | ICD-10-CM | POA: Insufficient documentation

## 2020-12-19 DIAGNOSIS — E11319 Type 2 diabetes mellitus with unspecified diabetic retinopathy without macular edema: Secondary | ICD-10-CM | POA: Insufficient documentation

## 2020-12-19 DIAGNOSIS — E1169 Type 2 diabetes mellitus with other specified complication: Secondary | ICD-10-CM | POA: Insufficient documentation

## 2020-12-19 DIAGNOSIS — E1122 Type 2 diabetes mellitus with diabetic chronic kidney disease: Secondary | ICD-10-CM | POA: Insufficient documentation

## 2020-12-19 DIAGNOSIS — E785 Hyperlipidemia, unspecified: Secondary | ICD-10-CM | POA: Insufficient documentation

## 2020-12-19 DIAGNOSIS — Z79899 Other long term (current) drug therapy: Secondary | ICD-10-CM | POA: Insufficient documentation

## 2020-12-19 LAB — GLUCOSE, POCT (MANUAL RESULT ENTRY): POC Glucose: 150 mg/dl — AB (ref 70–99)

## 2020-12-19 NOTE — Progress Notes (Signed)
Patient ID: [name] [name], male    DOB: 23-Nov-1985  MRN: [id number]  CC: Diabetes and Hypertension   Subjective: [name] [name] is a 35 y.o. 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  DM:  Results for orders placed or performed in visit on 12/19/20  POCT glucose (manual entry)  Result Value Ref Range   POC Glucose 150 (A) 70 - 99 mg/dl   Lab Results  Component Value Date   HGBA1C 6.6 (H) 11/24/2020   Patient reports he continues to do well.  Blood sugars have remained below 150.  He reports compliance with taking Trulicity and metformin.   HTN:  BP elev today.  Pt reports he did not take meds as yet for today.  BP noted to be elevated on last visit with EMT on 12/01/2020 Limits salt in foods Still gets RT sided CP about 1-2 x a wk, sharp and last about 1/2 hr.  No radiation, SOB.  Can come on at rest or with exertion.  Not worse with exertion.  I had put in an order for him to get a chest x-ray but he has not had this done as yet.  He plans to go and have this done soon.  He has not had any recent long distance travel. Will be going to [place] 01/10/21 and will be back sometime in December.  EMT worker has placed request for 3 mths supply of his meds to take with him Patient Active Problem List   Diagnosis Date Noted   Abnormality of gait 10/11/2020   Macroalbuminuric diabetic nephropathy ([place]) 08/10/2020   Hemiparesis of right dominant side due to non-cerebrovascular etiology ([place]) 08/10/2020   Diabetic ulcer of left midfoot associated with diabetes mellitus due to underlying condition, limited to breakdown of skin ([place]) 08/10/2020   Cellulitis, leg  03/17/2020   Cellulitis of right leg 03/17/2020   Uncontrolled type 2 diabetes mellitus with complication, with long-term current use of insulin    Abscess    Cellulitis 12/05/2019   Cellulitis of left lower extremity    Abscess of left lower extremity    Uncontrolled type 2 diabetes mellitus with hyperglycemia ([facility])    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 2 diabetes mellitus ([place])    Cellulitis of foot, left 11/22/2016   Diabetic neuropathy ([place]) 11/22/2016   Fracture of 2nd metatarsal 11/22/2016   Fracture of 3rd metatarsal 11/22/2016   Postprocedural non-healing wound 11/22/2016   Acute cystitis with positive culture    Cutaneous abscess of left foot    Osteomyelitis ([place]) 11/03/2016   Poorly controlled type 2 diabetes mellitus with complication ([place]) 76/54/6503   Hyperlipidemia 10/17/2016   Depression 10/17/2016   Neck pain    Asthma 04/11/2016   Uncontrolled type 2 diabetes mellitus with complication    Obesity, morbid ([place]) 09/06/2015   Hypertension associated with diabetes ([place]) 09/06/2015     Current Outpatient Medications on File Prior to Visit  Medication Sig Dispense Refill   acetaminophen (TYLENOL) 325 MG tablet Take 2 tablets (650 mg total) by mouth every 6 (six) hours as needed for mild pain or fever (for pain). 10 tablet 0   amLODipine (NORVASC) 5 MG tablet Take 1  tablet (5 mg total) by mouth daily. 90 tablet 3   Blood Glucose Monitoring Suppl (TRUE METRIX METER) w/Device KIT 1 each by Does not apply route in the morning and at bedtime. (Patient not taking: Reported on 12/01/2020) 1 kit 0   Continuous Blood Gluc Receiver (DEXCOM G6 RECEIVER) DEVI 1 Device by Does not apply route daily. 1 each 0   Continuous Blood Gluc Sensor (DEXCOM G6 SENSOR) MISC 1 packet by Does not apply route daily. 3 each 11   Continuous Blood Gluc Transmit (DEXCOM G6 TRANSMITTER) MISC USE AS DIRECTED DAILY 1 each 4   Dulaglutide (TRULICITY) 1.88 CZ/6.6AY SOPN Inject 0.75 mg into the skin once a week. 6 mL 5    gabapentin (NEURONTIN) 300 MG capsule Take 1 capsule (300 mg total) by mouth 2 (two) times daily. 180 capsule 1   glucose blood (TRUE METRIX BLOOD GLUCOSE TEST) test strip Use as instructed (Patient not taking: Reported on 12/01/2020) 100 each 12   glucose blood test strip USE AS INSTRUCTED (Patient not taking: No sig reported) 100 strip 12   insulin detemir (LEVEMIR) 100 UNIT/ML FlexPen Inject into the skin. (Patient not taking: No sig reported)     Insulin Pen Needle (PEN NEEDLES) 31G X 5 MM MISC 1 each by Does not apply route in the morning and at bedtime. (Patient not taking: No sig reported) 100 each 2   Insulin Pen Needle 31G X 5 MM MISC 1 EACH BY DOES NOT APPLY ROUTE IN THE MORNING AND AT BEDTIME. (Patient not taking: No sig reported) 100 each 2   lisinopril-hydrochlorothiazide (ZESTORETIC) 10-12.5 MG tablet Take 1 tablet by mouth daily. 90 tablet 1   metFORMIN (GLUCOPHAGE) 500 MG tablet Take 2 tablets (1,000 mg total) by mouth daily with breakfast. 180 tablet 1   pravastatin (PRAVACHOL) 20 MG tablet TAKE 1 TABLET (20 MG TOTAL) BY MOUTH DAILY. 90 tablet 1   No current facility-administered medications on file prior to visit.    No Known Allergies  Social History   Socioeconomic History   Marital status: Single    Spouse name: Not on file   Number of children: Not on file   Years of education: Not on file   Highest education level: Not on file  Occupational History   Not on file  Tobacco Use   Smoking status: Never   Smokeless tobacco: Never  Vaping Use   Vaping Use: Never used  Substance and Sexual Activity   Alcohol use: Yes    Comment: 11/06/2016 "I'll drink a little at parties; maybe 5 times/year"   Drug use: Yes    Types: Marijuana    Comment: 11/06/2016 "stopped ~ 1 month ago"   Sexual activity: Never    Birth control/protection: Diaphragm  Other Topics Concern   Not on file  Social History Narrative   Not on file   Social Determinants of Health   Financial  Resource Strain: Not on file  Food Insecurity: Not on file  Transportation Needs: Not on file  Physical Activity: Not on file  Stress: Not on file  Social Connections: Not on file  Intimate Partner Violence: Not on file    Family History  Problem Relation Age of Onset   Diabetes type II Mother    Diabetes type II Father     Past Surgical History:  Procedure Laterality Date   AMPUTATION Left 11/05/2016   Procedure: AMPUTATION LEFT GREAT TOE;  Surgeon: Newt Minion, MD;  Location: [facility];  Service: Orthopedics;  Laterality: Left;   CATARACT EXTRACTION Left 07/2020   DRESSING CHANGE UNDER ANESTHESIA N/A 04/13/2016   Procedure: DRESSING CHANGE UNDER ANESTHESIA;  Surgeon: Johnathan Hausen, MD;  Location: [facility];  Service: General;  Laterality: N/A;   INCISION AND DRAINAGE ABSCESS Right 09/03/2015   Procedure: INCISION AND DRAINAGE ABSCESS;  Surgeon: Roseanne Kaufman, MD;  Location: [facility];  Service: Orthopedics;  Laterality: Right;   INCISION AND DRAINAGE PERIRECTAL ABSCESS N/A 04/11/2016   Procedure: IRRIGATION AND DEBRIDEMENT RECTAL ABSCESS;  Surgeon: Johnathan Hausen, MD;  Location: [facility];  Service: General;  Laterality: N/A;   [place]; 1989   x2    IRRIGATION AND DEBRIDEMENT ABSCESS Left 08/31/2015   Procedure: IRRIGATION AND DEBRIDEMENT ABSCESS LEFT SHOULDER;  Surgeon: Alphonsa Overall, MD;  Location: [facility];  Service: General;  Laterality: Left;   WOUND DEBRIDEMENT N/A 02/24/2016   Procedure: SACRAL WOUND DEBRIDEMENT;  Surgeon: Georganna Skeans, MD;  Location: [facility];  Service: General;  Laterality: N/A;    ROS: Review of Systems Negative except as stated above  PHYSICAL EXAM: BP (!) 160/90   Pulse (!) 101   Resp 16   Wt 224 lb 9.6 oz (101.9 kg)   SpO2 98%   BMI 35.71 kg/m   Physical Exam   General appearance - alert, well appearing, and in no distress Mental status - normal mood, behavior, speech, dress, motor activity, and thought processes Neck - supple, no  significant adenopathy Chest - clear to auscultation, no wheezes, rales or rhonchi, symmetric air entry Heart - normal rate, regular rhythm, normal S1, S2, no murmurs, rubs, clicks or gallops Extremities - no LE edema  CMP Latest Ref Rng & Units 11/24/2020 07/19/2020 03/18/2020  Glucose 65 - 99 mg/dL 151(H) 323(H) 210(H)  BUN 6 - 20 mg/dL 35(H) 25(H) 16  Creatinine 0.76 - 1.27 mg/dL 2.12(H) 1.45(H) 1.27(H)  Sodium 134 - 144 mmol/L 141 136 136  Potassium 3.5 - 5.2 mmol/L 4.6 4.0 3.7  Chloride 96 - 106 mmol/L 105 104 104  CO2 20 - 29 mmol/L '22 25 23  ' Calcium 8.7 - 10.2 mg/dL 9.3 8.5(L) 9.0  Total Protein 6.5 - 8.1 g/dL - 6.5 -  Total Bilirubin 0.3 - 1.2 mg/dL - 0.3 -  Alkaline Phos 38 - 126 U/L - 80 -  AST 15 - 41 U/L - 11(L) -  ALT 0 - 44 U/L - 12 -   Lipid Panel     Component Value Date/Time   CHOL 125 12/06/2019 0749   TRIG 117 12/06/2019 0749   HDL 27 (L) 12/06/2019 0749   CHOLHDL 4.6 12/06/2019 0749   VLDL 23 12/06/2019 0749   LDLCALC 75 12/06/2019 0749    CBC    Component Value Date/Time   WBC 10.2 07/19/2020 1413   RBC 4.13 (L) 07/19/2020 1413   HGB 11.9 (L) 07/19/2020 1413   HGB 11.7 (L) 01/27/2020 1209   HCT 33.9 (L) 07/19/2020 1413   HCT 36.6 (L) 01/27/2020 1209   PLT 246 07/19/2020 1413   PLT 284 01/27/2020 1209   MCV 82.1 07/19/2020 1413   MCV 88 01/27/2020 1209   MCH 28.8 07/19/2020 1413   MCHC 35.1 07/19/2020 1413   RDW 12.9 07/19/2020 1413   RDW 13.7 01/27/2020 1209   LYMPHSABS 1.9 07/19/2020 1413   LYMPHSABS 1.7 01/27/2020 1209   MONOABS 0.5 07/19/2020 1413   EOSABS 0.2 07/19/2020 1413   EOSABS 0.1 01/27/2020 1209   BASOSABS 0.1 07/19/2020  1413   BASOSABS 0.1 01/27/2020 1209    ASSESSMENT AND PLAN: 1. Hypertension associated with diabetes ([place]) Not at goal.  Advised patient to take his blood pressure medications around about the same time every day.  He is agreeable to following up with our clinical pharmacist in 1 to 2 weeks for repeat blood  pressure check.  Advised to take his medications before coming.  2. Diabetic nephropathy associated with type 2 diabetes mellitus ([place]) Commended him on better control of his diabetes.  He will continue Trulicity and metformin. - POCT glucose (manual entry)  3. Chest pain in adult Atypical but he does have risk factors.  He will get the chest x-ray done as ordered.  Refer to cardiology.   Patient was given the opportunity to ask questions.  Patient verbalized understanding of the plan and was able to repeat key elements of the plan.   Orders Placed This Encounter  Procedures   POCT glucose (manual entry)     Requested Prescriptions    No prescriptions requested or ordered in this encounter    Return in about 3 months (around 03/21/2021) for Give appt with [facility] in 1-2 wks for BP recheck.  Karle Plumber, MD, FACP

## 2020-12-19 NOTE — Patient Instructions (Signed)
Try to take your blood pressure medications every day around about the same time. Follow-up with our clinical pharmacist in 1 to 2 weeks for repeat blood pressure check.

## 2021-01-02 ENCOUNTER — Ambulatory Visit: Payer: Medicaid Other | Attending: Internal Medicine | Admitting: Pharmacist

## 2021-01-02 ENCOUNTER — Other Ambulatory Visit: Payer: Self-pay

## 2021-01-02 ENCOUNTER — Encounter: Payer: Self-pay | Admitting: Pharmacist

## 2021-01-02 DIAGNOSIS — E1121 Type 2 diabetes mellitus with diabetic nephropathy: Secondary | ICD-10-CM

## 2021-01-02 DIAGNOSIS — E1159 Type 2 diabetes mellitus with other circulatory complications: Secondary | ICD-10-CM | POA: Diagnosis not present

## 2021-01-02 DIAGNOSIS — I152 Hypertension secondary to endocrine disorders: Secondary | ICD-10-CM

## 2021-01-02 MED ORDER — AMLODIPINE BESYLATE 5 MG PO TABS: 5.0000 mg | ORAL_TABLET | Freq: Every day | ORAL | 3 refills | Status: DC

## 2021-01-02 MED ORDER — LISINOPRIL-HYDROCHLOROTHIAZIDE 10-12.5 MG PO TABS: 1.0000 | ORAL_TABLET | Freq: Every day | ORAL | 1 refills | Status: DC

## 2021-01-02 NOTE — Progress Notes (Signed)
   S:    PCP: Dr. Wynetta Emery  Patient arrives in good spirits. Presents to the clinic for hypertension evaluation, counseling, and management. Patient was referred and last seen by Primary Care Provider on 12/19/2020. BP was 160/90 at that visit, however, pt had not taken BP medications that day.   Medication adherence reported. Took both medications ~12p today.   Current BP Medications include:  amlodipine 5 mg daily, lisinopril-HCTZ 10-12.5 mg daily  Dietary habits include: does not add salt to foods but eats what's affordable. He names several processed foods such as mac-n-cheese. Only drinks 2 cups of coffee during the week.  Exercise habits include: limited d/t unstable gait  Family / Social history:  -Fhx: DM -Tobacco: never smoker  - Alcohol: denies use    O:  Vitals:   01/02/21 1626  BP: (!) 130/91    Home BP readings: none  Last 3 Office BP readings: BP Readings from Last 3 Encounters:  12/19/20 (!) 160/90  12/01/20 (!) 160/100  11/03/20 (!) 160/100    BMET    Component Value Date/Time   NA 141 11/24/2020 1358   K 4.6 11/24/2020 1358   CL 105 11/24/2020 1358   CO2 22 11/24/2020 1358   GLUCOSE 151 (H) 11/24/2020 1358   GLUCOSE 323 (H) 07/19/2020 1413   BUN 35 (H) 11/24/2020 1358   CREATININE 2.12 (H) 11/24/2020 1358   CREATININE 0.73 04/10/2016 1125   CALCIUM 9.3 11/24/2020 1358   GFRNONAA >60 07/19/2020 1413   GFRNONAA >89 04/10/2016 1125   GFRAA 91 01/27/2020 1209   GFRAA >89 04/10/2016 1125    Renal function: CrCl cannot be calculated (Patient's most recent lab result is older than the maximum 21 days allowed.).  Clinical ASCVD: No  The ASCVD Risk score (Arnett DK, et al., 2019) failed to calculate for the following reasons:   The 2019 ASCVD risk score is only valid for ages 22 to 9  A/P: Hypertension longstanding currently uncontrolled on current medications. BP Goal = < 130/80 mmHg. Medication adherence reported. I would like to increase  amlodipine dose to 10 mg daily, however, pt is traveling to [place] next week and will be there through the holiday season. He plans to return to [facility] in January. I would not be able to reassess BP after dose titration. Therefore, will have patient continue current regimen.  -Continued current regimen.  -Counseled on lifestyle modifications for blood pressure control including reduced dietary sodium, increased exercise, adequate sleep.  Results reviewed and written information provided.   Total time in face-to-face counseling 30 minutes.   F/U Clinic Visit with PCP.   Benard Halsted, PharmD, [place], [place] [phone]

## 2021-01-10 ENCOUNTER — Telehealth (HOSPITAL_COMMUNITY): Payer: Self-pay

## 2021-01-10 NOTE — Telephone Encounter (Signed)
Reached out to [name] [name] reference home visit however he reports he is out of town until further notice and will reach out once he returns. He reports having all his medications and has no needs at this time. Call complete.

## 2021-01-27 ENCOUNTER — Ambulatory Visit: Payer: Medicaid Other | Admitting: Internal Medicine

## 2021-02-07 ENCOUNTER — Other Ambulatory Visit: Payer: Self-pay

## 2021-02-07 ENCOUNTER — Telehealth: Payer: Self-pay

## 2021-02-07 NOTE — Telephone Encounter (Signed)
PA for Dexcom products approved.  Pharmacy notified.

## 2021-02-21 ENCOUNTER — Telehealth (HOSPITAL_COMMUNITY): Payer: Self-pay

## 2021-02-21 NOTE — Telephone Encounter (Signed)
Left message for [name] [name] to return my call in reference to paramedicine visit. Last we spoke he was in [place] and was unsure about his return to [facility]. I will continue to reach out.

## 2021-02-21 NOTE — Telephone Encounter (Signed)
Received a call back from [name] [name] who reports he returned back to [place] this week and is doing well. He reports having all his medications but is searching for someone who could repair his motorized chair under insurance. I will forward this question to Opal Sidles at [facility] as I am unsure. I will continue to assist as needed.

## 2021-02-27 ENCOUNTER — Other Ambulatory Visit: Payer: Self-pay

## 2021-03-06 ENCOUNTER — Other Ambulatory Visit: Payer: Self-pay

## 2021-03-21 ENCOUNTER — Other Ambulatory Visit: Payer: Self-pay

## 2021-03-21 ENCOUNTER — Encounter: Payer: Self-pay | Admitting: Internal Medicine

## 2021-03-21 ENCOUNTER — Ambulatory Visit: Payer: Medicaid Other | Attending: Internal Medicine | Admitting: Internal Medicine

## 2021-03-21 VITALS — BP 141/88 | HR 94 | Resp 16 | Wt 225.6 lb

## 2021-03-21 DIAGNOSIS — Z77011 Contact with and (suspected) exposure to lead: Secondary | ICD-10-CM

## 2021-03-21 DIAGNOSIS — I152 Hypertension secondary to endocrine disorders: Secondary | ICD-10-CM

## 2021-03-21 DIAGNOSIS — E1142 Type 2 diabetes mellitus with diabetic polyneuropathy: Secondary | ICD-10-CM

## 2021-03-21 DIAGNOSIS — D649 Anemia, unspecified: Secondary | ICD-10-CM

## 2021-03-21 DIAGNOSIS — R1110 Vomiting, unspecified: Secondary | ICD-10-CM | POA: Diagnosis not present

## 2021-03-21 DIAGNOSIS — E1159 Type 2 diabetes mellitus with other circulatory complications: Secondary | ICD-10-CM

## 2021-03-21 DIAGNOSIS — E1121 Type 2 diabetes mellitus with diabetic nephropathy: Secondary | ICD-10-CM

## 2021-03-21 DIAGNOSIS — Z1159 Encounter for screening for other viral diseases: Secondary | ICD-10-CM

## 2021-03-21 LAB — POCT GLYCOSYLATED HEMOGLOBIN (HGB A1C): HbA1c, POC (controlled diabetic range): 6.8 % (ref 0.0–7.0)

## 2021-03-21 MED ORDER — AMLODIPINE BESYLATE 10 MG PO TABS
10.0000 mg | ORAL_TABLET | Freq: Every day | ORAL | 6 refills | Status: DC
  Filled 2021-03-21: qty 30, 30d supply, fill #0

## 2021-03-21 MED ORDER — ONDANSETRON HCL 4 MG PO TABS: 4.0000 mg | ORAL_TABLET | Freq: Two times a day (BID) | ORAL | 2 refills | Status: DC | PRN

## 2021-03-21 NOTE — Progress Notes (Addendum)
Patient ID: [name] [name], male    DOB: 07-10-1985  MRN: [id number]  CC: Diabetes and Hypertension   Subjective: [name] [name] is a 36 y.o. male who presents for chronic ds management.  Brother, [name] [name] is with him.   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  Was with family in [place] for 1 mth.  His family had been living in house for a few months.  His young nephew who had been staying there was recently diagnosed with lead poisoning.  His cousin called him and told him that he should be checked.    DIABETES TYPE 2 Last A1C:   Results for orders placed or performed in visit on 03/21/21  POCT glycosylated hemoglobin (Hb A1C)  Result Value Ref Range   Hemoglobin A1C     HbA1c POC (<> result, manual entry)     HbA1c, POC (prediabetic range)     HbA1c, POC (controlled diabetic range) 6.8 0.0 - 7.0 %    Med Adherence:  [x] Yes  -Trulicity and Metformin. Some days he forgets to take the Metformin   Medication side effects:  [] Yes    [] No Home Monitoring?  [x] Yes has Dexcom   [] No Home glucose results range:98-160 Diet Adherence: Trying to do better with his eating habits. Exercise: [] Yes    [] No Hypoglycemic episodes?: [] Yes    [x] No Numbness of the feet? [x] Yes    [] No Retinopathy hx? [x] Yes    [] No Last eye exam:  Comments: throw up just about every time he eats for mths.  Can occur through out the day even b/w eals. Getting worse.  Thinks it may be acid reflux.  No burning in stomach with meals.  No abdominal pain with meals.  Some pains at time on the right mid part of the abdomen.  HTN: Reports compliance with Norvasc 5 mg and lisinopril/HCTZ daily.  Took meds already today Checks  BP once a wk.  Does not have log with him but gives range 150-160/90-100  HL:  taking and tolerating Pravachol.  Patient Active Problem List   Diagnosis Date Noted   Abnormality of gait 10/11/2020   Macroalbuminuric diabetic nephropathy ([place]) 08/10/2020   Hemiparesis of right dominant side due to non-cerebrovascular etiology ([place]) 08/10/2020   Diabetic ulcer of left midfoot associated with diabetes mellitus due to underlying condition, limited to breakdown of skin ([place]) 08/10/2020   Cellulitis, leg 03/17/2020   Cellulitis of right leg 03/17/2020   Uncontrolled type 2 diabetes mellitus with complication, with long-term current use of insulin    Abscess    Cellulitis 12/05/2019   Cellulitis of left lower extremity    Abscess of left lower extremity    Uncontrolled type 2 diabetes mellitus with hyperglycemia (HCC)    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 2 diabetes mellitus ([place])    Cellulitis of foot, left 11/22/2016   Diabetic neuropathy ([place]) 11/22/2016   Fracture of 2nd metatarsal 11/22/2016   Fracture of 3rd metatarsal 11/22/2016   Postprocedural non-healing wound 11/22/2016   Acute cystitis with positive culture    Cutaneous abscess of left foot    Poorly controlled type 2 diabetes mellitus with complication (  [place]) 10/27/2016   Hyperlipidemia 10/17/2016   Depression 10/17/2016   Neck pain    Asthma 04/11/2016   Uncontrolled type 2 diabetes mellitus with complication    Obesity, morbid ([place]) 09/06/2015   Hypertension associated with diabetes ([place]) 09/06/2015     Current Outpatient Medications on File Prior to Visit  Medication Sig Dispense Refill   acetaminophen (TYLENOL) 325 MG tablet Take 2 tablets (650 mg total) by mouth every 6 (six) hours as needed for mild pain or fever (for pain). 10 tablet 0   Blood Glucose Monitoring Suppl (TRUE METRIX METER) w/Device KIT 1 each by Does not apply route in the morning and at bedtime. (Patient not taking: Reported on 12/01/2020) 1 kit 0   Continuous  Blood Gluc Receiver (DEXCOM G6 RECEIVER) DEVI 1 Device by Does not apply route daily. 1 each 0   Continuous Blood Gluc Sensor (DEXCOM G6 SENSOR) MISC 1 packet by Does not apply route daily. 3 each 11   Continuous Blood Gluc Transmit (DEXCOM G6 TRANSMITTER) MISC USE AS DIRECTED DAILY 1 each 4   Dulaglutide (TRULICITY) 8.63 OT/7.7NH SOPN Inject 0.75 mg into the skin once a week. 6 mL 5   gabapentin (NEURONTIN) 300 MG capsule Take 1 capsule (300 mg total) by mouth 2 (two) times daily. 180 capsule 1   glucose blood (TRUE METRIX BLOOD GLUCOSE TEST) test strip Use as instructed (Patient not taking: Reported on 12/01/2020) 100 each 12   insulin detemir (LEVEMIR) 100 UNIT/ML FlexPen Inject into the skin. (Patient not taking: No sig reported)     Insulin Pen Needle (PEN NEEDLES) 31G X 5 MM MISC 1 each by Does not apply route in the morning and at bedtime. (Patient not taking: No sig reported) 100 each 2   Insulin Pen Needle 31G X 5 MM MISC 1 EACH BY DOES NOT APPLY ROUTE IN THE MORNING AND AT BEDTIME. (Patient not taking: No sig reported) 100 each 2   lisinopril-hydrochlorothiazide (ZESTORETIC) 10-12.5 MG tablet Take 1 tablet by mouth daily. 90 tablet 1   metFORMIN (GLUCOPHAGE) 500 MG tablet Take 2 tablets (1,000 mg total) by mouth daily with breakfast. 180 tablet 1   pravastatin (PRAVACHOL) 20 MG tablet TAKE 1 TABLET (20 MG TOTAL) BY MOUTH DAILY. 90 tablet 1   No current facility-administered medications on file prior to visit.    No Known Allergies  Social History   Socioeconomic History   Marital status: Single    Spouse name: Not on file   Number of children: Not on file   Years of education: Not on file   Highest education level: Not on file  Occupational History   Not on file  Tobacco Use   Smoking status: Never   Smokeless tobacco: Never  Vaping Use   Vaping Use: Never used  Substance and Sexual Activity   Alcohol use: Yes    Comment: 11/06/2016 "I'll drink a little at parties; maybe 5  times/year"   Drug use: Yes    Types: Marijuana    Comment: 11/06/2016 "stopped ~ 1 month ago"   Sexual activity: Never    Birth control/protection: Diaphragm  Other Topics Concern   Not on file  Social History Narrative   Not on file   Social Determinants of Health   Financial Resource Strain: Not on file  Food Insecurity: Not on file  Transportation Needs: Not on file  Physical Activity: Not on file  Stress: Not on file  Social Connections: Not on file  Intimate  Partner Violence: Not on file    Family History  Problem Relation Age of Onset   Diabetes type II Mother    Diabetes type II Father     Past Surgical History:  Procedure Laterality Date   AMPUTATION Left 11/05/2016   Procedure: AMPUTATION LEFT GREAT TOE;  Surgeon: Newt Minion, MD;  Location: [facility];  Service: Orthopedics;  Laterality: Left;   CATARACT EXTRACTION Left 07/2020   DRESSING CHANGE UNDER ANESTHESIA N/A 04/13/2016   Procedure: DRESSING CHANGE UNDER ANESTHESIA;  Surgeon: Johnathan Hausen, MD;  Location: [facility];  Service: General;  Laterality: N/A;   INCISION AND DRAINAGE ABSCESS Right 09/03/2015   Procedure: INCISION AND DRAINAGE ABSCESS;  Surgeon: Roseanne Kaufman, MD;  Location: [facility];  Service: Orthopedics;  Laterality: Right;   INCISION AND DRAINAGE PERIRECTAL ABSCESS N/A 04/11/2016   Procedure: IRRIGATION AND DEBRIDEMENT RECTAL ABSCESS;  Surgeon: Johnathan Hausen, MD;  Location: [facility];  Service: General;  Laterality: N/A;   [place]; 1989   x2    IRRIGATION AND DEBRIDEMENT ABSCESS Left 08/31/2015   Procedure: IRRIGATION AND DEBRIDEMENT ABSCESS LEFT SHOULDER;  Surgeon: Alphonsa Overall, MD;  Location: [facility];  Service: General;  Laterality: Left;   WOUND DEBRIDEMENT N/A 02/24/2016   Procedure: SACRAL WOUND DEBRIDEMENT;  Surgeon: Georganna Skeans, MD;  Location: [facility];  Service: General;  Laterality: N/A;    ROS: Review of Systems Negative except as stated above  PHYSICAL EXAM: BP (!)  141/88    Pulse 94    Resp 16    Wt 225 lb 9.6 oz (102.3 kg)    SpO2 99%    BMI 35.87 kg/m   Wt Readings from Last 3 Encounters:  03/21/21 225 lb 9.6 oz (102.3 kg)  12/19/20 224 lb 9.6 oz (101.9 kg)  11/03/20 227 lb (103 kg)    Physical Exam  General appearance - alert, well appearing, and in no distress Neck - supple, no significant adenopathy Chest - clear to auscultation, no wheezes, rales or rhonchi, symmetric air entry Heart - normal rate, regular rhythm, normal S1, S2, no murmurs, rubs, clicks or gallops Extremities -no lower extremity edema.  Results for orders placed or performed in visit on 03/21/21  POCT glycosylated hemoglobin (Hb A1C)  Result Value Ref Range   Hemoglobin A1C     HbA1c POC (<> result, manual entry)     HbA1c, POC (prediabetic range)     HbA1c, POC (controlled diabetic range) 6.8 0.0 - 7.0 %     CMP Latest Ref Rng & Units 11/24/2020 07/19/2020 03/18/2020  Glucose 65 - 99 mg/dL 151(H) 323(H) 210(H)  BUN 6 - 20 mg/dL 35(H) 25(H) 16  Creatinine 0.76 - 1.27 mg/dL 2.12(H) 1.45(H) 1.27(H)  Sodium 134 - 144 mmol/L 141 136 136  Potassium 3.5 - 5.2 mmol/L 4.6 4.0 3.7  Chloride 96 - 106 mmol/L 105 104 104  CO2 20 - 29 mmol/L _0 Calcium 8.7 - 10.2 mg/dL 9.3 8.5(L) 9.0  Total Protein 6.5 - 8.1 g/dL - 6.5 -  Total Bilirubin 0.3 - 1.2 mg/dL - 0.3 -  Alkaline Phos 38 - 126 U/L - 80 -  AST 15 - 41 U/L - 11(L) -  ALT 0 - 44 U/L - 12 -   Lipid Panel     Component Value Date/Time   CHOL 125 12/06/2019 0749   TRIG 117 12/06/2019 0749   HDL 27 (L) 12/06/2019 0749   CHOLHDL 4.6 12/06/2019 0749  VLDL 23 12/06/2019 0749  ° LDLCALC 75 12/06/2019 0749  ° ° °CBC °   °Component Value Date/Time  ° WBC 10.2 07/19/2020 1413  ° RBC 4.13 (L) 07/19/2020 1413  ° HGB 11.9 (L) 07/19/2020 1413  ° HGB 11.7 (L) 01/27/2020 1209  ° HCT 33.9 (L) 07/19/2020 1413  ° HCT 36.6 (L) 01/27/2020 1209  ° PLT 246 07/19/2020 1413  ° PLT 284 01/27/2020 1209  ° MCV 82.1 07/19/2020 1413  ° MCV  88 01/27/2020 1209  ° MCH 28.8 07/19/2020 1413  ° MCHC 35.1 07/19/2020 1413  ° RDW 12.9 07/19/2020 1413  ° RDW 13.7 01/27/2020 1209  ° LYMPHSABS 1.9 07/19/2020 1413  ° LYMPHSABS 1.7 01/27/2020 1209  ° MONOABS 0.5 07/19/2020 1413  ° EOSABS 0.2 07/19/2020 1413  ° EOSABS 0.1 01/27/2020 1209  ° BASOSABS 0.1 07/19/2020 1413  ° BASOSABS 0.1 01/27/2020 1209  ° ° °ASSESSMENT AND PLAN: °1. Type 2 diabetes mellitus with peripheral neuropathy (HCC) °At goal.  Encouraged him to continue metformin and Trulicity.  Try to eat healthy.  Try to move as much as he can. °- POCT glycosylated hemoglobin (Hb A1C) °- CBC °- Comprehensive metabolic panel °- Lipid panel ° °2. Hypertension associated with diabetes (HCC) °Not at goal.  Increase amlodipine to 10 mg daily.  Follow-up with clinical pharmacist in a few weeks for repeat blood pressure check. °- amLODipine (NORVASC) 10 MG tablet; Take 1 tablet (10 mg total) by mouth daily.  Dispense: 30 tablet; Refill: 6 ° °3. Recurrent vomiting °Likely gastroparesis.  Also need to rule out gallbladder/gallstones.  I have ordered gastric emptying study and an ultrasound of the gallbladder.  Start Zofran to use as needed.  Further management will be based on results. °- NM Gastric Emptying; Future °- US Abdomen Limited RUQ (LIVER/GB); Future °- ondansetron (ZOFRAN) 4 MG tablet; Take 1 tablet (4 mg total) by mouth 2 (two) times daily as needed for nausea or vomiting.  Dispense: 30 tablet; Refill: 2 °- Lipase ° °4. Lead exposure °- Lead, blood (adult age 16 yrs or greater) ° °5. Need for hepatitis C screening test °- HCV Ab w Reflex to Quant PCR ° ° ° ° °Patient was given the opportunity to ask questions.  Patient verbalized understanding of the plan and was able to repeat key elements of the plan.  ° °Orders Placed This Encounter  °Procedures  ° NM Gastric Emptying  ° US Abdomen Limited RUQ (LIVER/GB)  ° HCV Ab w Reflex to Quant PCR  ° Lead, blood (adult age 16 yrs or greater)  ° CBC  ° Comprehensive  metabolic panel  ° Lipid panel  ° Lipase  ° POCT glycosylated hemoglobin (Hb A1C)  ° ° ° °Requested Prescriptions  ° °Signed Prescriptions Disp Refills  ° ondansetron (ZOFRAN) 4 MG tablet 30 tablet 2  °  Sig: Take 1 tablet (4 mg total) by mouth 2 (two) times daily as needed for nausea or vomiting.  ° amLODipine (NORVASC) 10 MG tablet 30 tablet 6  °  Sig: Take 1 tablet (10 mg total) by mouth daily.  ° ° °Return in about 4 months (around 07/19/2021) for Appt with Luke in 2 wks for BP check. ° °Deborah Johnson, MD, FACP °

## 2021-03-21 NOTE — Patient Instructions (Signed)
Your blood pressure is not at goal.  Increase amlodipine to 10 mg daily. I have sent a prescription to your pharmacy for Zofran to use as needed for the nausea.  We have ordered a gastric emptying study and an ultrasound of the gallbladder to evaluate for gallstones.

## 2021-03-22 ENCOUNTER — Telehealth: Payer: Self-pay | Admitting: Internal Medicine

## 2021-03-22 ENCOUNTER — Telehealth: Payer: Self-pay

## 2021-03-22 ENCOUNTER — Ambulatory Visit: Payer: Medicaid Other | Admitting: Internal Medicine

## 2021-03-22 LAB — COMPREHENSIVE METABOLIC PANEL
ALT: 12 IU/L (ref 0–44)
AST: 13 IU/L (ref 0–40)
Albumin/Globulin Ratio: 1.4 (ref 1.2–2.2)
Albumin: 3.5 g/dL — ABNORMAL LOW (ref 4.0–5.0)
Alkaline Phosphatase: 91 IU/L (ref 44–121)
BUN/Creatinine Ratio: 14 (ref 9–20)
BUN: 34 mg/dL — ABNORMAL HIGH (ref 6–20)
Bilirubin Total: <0.2 mg/dL (ref 0.0–1.2)
CO2: 23 mmol/L (ref 20–29)
Calcium: 8.8 mg/dL (ref 8.7–10.2)
Chloride: 106 mmol/L (ref 96–106)
Creatinine, Ser: 2.48 mg/dL — ABNORMAL HIGH (ref 0.76–1.27)
Globulin, Total: 2.5 g/dL (ref 1.5–4.5)
Glucose: 143 mg/dL — ABNORMAL HIGH (ref 70–99)
Potassium: 3.8 mmol/L (ref 3.5–5.2)
Sodium: 142 mmol/L (ref 134–144)
Total Protein: 6 g/dL (ref 6.0–8.5)
eGFR: 34 mL/min/{1.73_m2} — ABNORMAL LOW (ref >59)

## 2021-03-22 LAB — HCV AB W REFLEX TO QUANT PCR: HCV Ab: <0.1 {s_co_ratio} (ref 0.0–0.9)

## 2021-03-22 LAB — CBC
Hematocrit: 30 % — ABNORMAL LOW (ref 37.5–51.0)
Hemoglobin: 10.5 g/dL — ABNORMAL LOW (ref 13.0–17.7)
MCH: 29.7 pg (ref 26.6–33.0)
MCHC: 35 g/dL (ref 31.5–35.7)
MCV: 85 fL (ref 79–97)
Platelets: 308 10*3/uL (ref 150–450)
RBC: 3.54 x10E6/uL — ABNORMAL LOW (ref 4.14–5.80)
RDW: 13 % (ref 11.6–15.4)
WBC: 10.3 10*3/uL (ref 3.4–10.8)

## 2021-03-22 LAB — HCV INTERPRETATION

## 2021-03-22 LAB — LIPASE: Lipase: 36 U/L (ref 13–78)

## 2021-03-22 LAB — LEAD, BLOOD (ADULT >= 16 YRS): Lead-Whole Blood: <1 ug/dL (ref 0.0–3.4)

## 2021-03-22 LAB — LIPID PANEL
Chol/HDL Ratio: 4 ratio (ref 0.0–5.0)
Cholesterol, Total: 149 mg/dL (ref 100–199)
HDL: 37 mg/dL — ABNORMAL LOW (ref >39)
LDL Chol Calc (NIH): 93 mg/dL (ref 0–99)
Triglycerides: 104 mg/dL (ref 0–149)
VLDL Cholesterol Cal: 19 mg/dL (ref 5–40)

## 2021-03-22 MED ORDER — EMPAGLIFLOZIN 10 MG PO TABS: ORAL_TABLET | ORAL | 2 refills | Status: DC

## 2021-03-22 NOTE — Telephone Encounter (Signed)
Contacted pt to go over lab results pt didn't answer lvm   Sent a CRM and forward labs to NT to give pt labs when they call back   

## 2021-03-22 NOTE — Progress Notes (Signed)
Let patient know that he has a mild anemia that is slightly worse compared to when it was last checked in May of last year.  This is likely due to to his poor kidney function.  We will asked the lab to add iron level to blood that was already drawn. -Kidney function not 100% and worse compared to September of last year.  Please inquire whether he has received appointment as yet to see the kidney specialist.  Stop metformin.  We will put him on a different diabetes medication instead called Jardiance to take 3 days a week.  Please stay hydrated while on this medication.  This means he should drink at least 4 to 8 cups of water daily.  If ever he develops persistent vomiting or diarrhea, he should stop the medication until it resolves before resuming.  Please return to the lab in 2 wks for repeat kidney function check.  Cholesterol level was 93 with goal being less than 70.  Please make sure that he is taking the pravastatin consistently every day as prescribed.

## 2021-03-22 NOTE — Addendum Note (Signed)
Addended by: Karle Plumber B on: 03/22/2021 12:01 PM   Modules accepted: Orders

## 2021-03-22 NOTE — Telephone Encounter (Signed)
Phone call placed to patient this morning to go over lab results.  I left a message on his voicemail informing him that his kidney function is a little worse and that I would like for him to stop the metformin for now. -start Jardiance instead at low dose of 10 mg starting at frequency of 3 days a wk.  Advised of the importance of staying hydrated while on this medication.  Should drink at least 4 to 8 cups of water a day while on the medication.  If he develops any vomiting or diarrhea he should stop the medicine until it completely resolves.  I have called [facility] and spoke with the pharmacist this morning to let them know as well.  He tells me that he was just preparing patient's blister pack. -I also wanted to know whether he has an upcoming appointment with the nephrologist.  I had submitted the referral several months ago. -He has a chronic anemia that is a little worse.  I think it is most likely due to his kidney function.  I will asked the lab to add iron studies on to blood that was already drawn. Message sent to my CMA to try to call him with his lab results.  Results for orders placed or performed in visit on 03/21/21  HCV Ab w Reflex to Quant PCR  Result Value Ref Range   HCV Ab <0.1 0.0 - 0.9 s/co ratio  CBC  Result Value Ref Range   WBC 10.3 3.4 - 10.8 x10E3/uL   RBC 3.54 (L) 4.14 - 5.80 x10E6/uL   Hemoglobin 10.5 (L) 13.0 - 17.7 g/dL   Hematocrit 30.0 (L) 37.5 - 51.0 %   MCV 85 79 - 97 fL   MCH 29.7 26.6 - 33.0 pg   MCHC 35.0 31.5 - 35.7 g/dL   RDW 13.0 11.6 - 15.4 %   Platelets 308 150 - 450 x10E3/uL  Comprehensive metabolic panel  Result Value Ref Range   Glucose 143 (H) 70 - 99 mg/dL   BUN 34 (H) 6 - 20 mg/dL   Creatinine, Ser 2.48 (H) 0.76 - 1.27 mg/dL   eGFR 34 (L) >59 mL/min/1.73   BUN/Creatinine Ratio 14 9 - 20   Sodium 142 134 - 144 mmol/L   Potassium 3.8 3.5 - 5.2 mmol/L   Chloride 106 96 - 106 mmol/L   CO2 23 20 - 29 mmol/L   Calcium 8.8 8.7 - 10.2  mg/dL   Total Protein 6.0 6.0 - 8.5 g/dL   Albumin 3.5 (L) 4.0 - 5.0 g/dL   Globulin, Total 2.5 1.5 - 4.5 g/dL   Albumin/Globulin Ratio 1.4 1.2 - 2.2   Bilirubin Total <0.2 0.0 - 1.2 mg/dL   Alkaline Phosphatase 91 44 - 121 IU/L   AST 13 0 - 40 IU/L   ALT 12 0 - 44 IU/L  Lipid panel  Result Value Ref Range   Cholesterol, Total 149 100 - 199 mg/dL   Triglycerides 104 0 - 149 mg/dL   HDL 37 (L) >39 mg/dL   VLDL Cholesterol Cal 19 5 - 40 mg/dL   LDL Chol Calc (NIH) 93 0 - 99 mg/dL   Chol/HDL Ratio 4.0 0.0 - 5.0 ratio  Lipase  Result Value Ref Range   Lipase 36 13 - 78 U/L  Interpretation:  Result Value Ref Range   HCV Interp 1: Comment   POCT glycosylated hemoglobin (Hb A1C)  Result Value Ref Range   Hemoglobin A1C  HbA1c POC (<> result, manual entry)     HbA1c, POC (prediabetic range)     HbA1c, POC (controlled diabetic range) 6.8 0.0 - 7.0 %

## 2021-03-24 LAB — SPECIMEN STATUS REPORT

## 2021-03-24 LAB — IRON,TIBC AND FERRITIN PANEL
Ferritin: 168 ng/mL (ref 30–400)
Iron Saturation: 11 % — ABNORMAL LOW (ref 15–55)
Iron: 23 ug/dL — ABNORMAL LOW (ref 38–169)
Total Iron Binding Capacity: 211 ug/dL — ABNORMAL LOW (ref 250–450)
UIBC: 188 ug/dL (ref 111–343)

## 2021-03-27 ENCOUNTER — Telehealth: Payer: Self-pay | Admitting: *Deleted

## 2021-03-27 ENCOUNTER — Ambulatory Visit (HOSPITAL_COMMUNITY)
Admission: RE | Admit: 2021-03-27 | Discharge: 2021-03-27 | Disposition: A | Discharge status: Home / Self Care | Payer: Medicaid Other | Source: Ambulatory Visit | Attending: Internal Medicine | Admitting: Internal Medicine

## 2021-03-27 ENCOUNTER — Other Ambulatory Visit: Payer: Self-pay

## 2021-03-27 DIAGNOSIS — R1110 Vomiting, unspecified: Secondary | ICD-10-CM | POA: Insufficient documentation

## 2021-03-27 NOTE — Progress Notes (Signed)
Let patient know that the abdominal ultrasound shows small stones in the gallbladder.  We will await the results of his gastric emptying study and then determine further course of action.

## 2021-03-27 NOTE — Telephone Encounter (Signed)
Patient called for results and notified after getting lab letter:  His anemia is due to his chronic kidney disease, not iron deficiency.Ladell Pier, MD  03/22/2021  6:28 PM EST     Lead exposure test is negative.   Ladell Pier, MD  03/22/2021 10:20 AM EST     Let patient know that he has a mild anemia that is slightly worse compared to when it was last checked in May of last year.  This is likely due to to his poor kidney function.  We will asked the lab to add iron level to blood that was already drawn. -Kidney function not 100% and worse compared to September of last year.  Please inquire whether he has received appointment as yet to see the kidney specialist.  Stop metformin.  We will put him on a different diabetes medication instead called Jardiance to take 3 days a week.  Please stay hydrated while on this medication.  This means he should drink at least 4 to 8 cups of water daily.  If ever he develops persistent vomiting or diarrhea, he should stop the medication until it resolves before resuming.  Please return to the lab in 2 wks for repeat kidney function check.  Cholesterol level was 93 with goal being less than 70.  Please make sure that he is taking the pravastatin consistently every day as prescribed.   Patient instructed on change in medication and need for repeat labs in 2 weeks. Patient states he is taking his cholesterol medication

## 2021-03-28 ENCOUNTER — Telehealth: Payer: Self-pay

## 2021-03-28 NOTE — Telephone Encounter (Signed)
Contacted pt to go over lab results pt is aware and doesn't have any questions or concerns 

## 2021-03-29 ENCOUNTER — Telehealth: Payer: Self-pay | Admitting: Internal Medicine

## 2021-03-29 ENCOUNTER — Other Ambulatory Visit: Payer: Self-pay

## 2021-03-29 ENCOUNTER — Ambulatory Visit (HOSPITAL_COMMUNITY)
Admission: RE | Admit: 2021-03-29 | Discharge: 2021-03-29 | Disposition: A | Discharge status: Home / Self Care | Payer: Medicaid Other | Source: Ambulatory Visit | Attending: Internal Medicine | Admitting: Internal Medicine

## 2021-03-29 DIAGNOSIS — R1110 Vomiting, unspecified: Secondary | ICD-10-CM | POA: Insufficient documentation

## 2021-03-29 MED ORDER — TECHNETIUM TC 99M SULFUR COLLOID
2.0000 | Freq: Once | INTRAVENOUS | Status: AC | PRN
  Administered 2021-03-29: 2 via INTRAVENOUS

## 2021-03-29 NOTE — Telephone Encounter (Signed)
-----   Message from Ena Dawley sent at 03/28/2021  2:31 PM EST ----- Regarding: RE: nephrology Rate # 2  means  appt  soon as possible  they contacted him  several times  9/19, 11/11 and on December he said that he was going to [facility]  . They were going to close the referral but  patient called yesterday and appt schedule for    04/17/21 @ 1pm    ----- Message ----- From: Ladell Pier, MD Sent: 03/25/2021   8:22 AM EST To: Ena Dawley Subject: RE: nephrology                                 What does rated #2 means?  Do I need to resubmit the referral.  Pt was in [place] for several wks but is now back in [place]. ----- Message ----- From: Ena Dawley Sent: 03/24/2021   2:29 PM EST To: Ladell Pier, MD Subject: RE: nephrology                                 "12/27/20 spoke with patient - he doesn't want an appointment until January, going to [place]. (He is rated a #2 by our physician) -Pam 01/02/21" Cephus Shelling said on Jan 02, 2021 12:34 PM  ----- Message ----- From: Ladell Pier, MD Sent: 03/24/2021   7:40 AM EST To: Ena Dawley Subject: nephrology                                     Any appts as yet?

## 2021-03-30 ENCOUNTER — Telehealth (HOSPITAL_COMMUNITY): Payer: Self-pay

## 2021-03-30 NOTE — Telephone Encounter (Signed)
noted 

## 2021-03-30 NOTE — Telephone Encounter (Signed)
Spoke to [name] [name] who reports he is doing well. He has been followed up with PCP this month and reports he has all of his medications in his bubble packs. He reports just needing assistance with appointment list and setting up transportation for an upcoming appointment. I was able to review his appointments with him and set up a ride for his upcoming appointment. He was grateful for this.   We discussed discharge from paramedicine program and he has housing, transportation (himself, friends, or [facility] transport), access to his medications by bubble packs at [facility], established with care providers and has access to food with no issues. At this time he is discharged from paramedicine program and understands to reach out to [facility] for any further needs.   Call complete.

## 2021-03-31 ENCOUNTER — Telehealth: Payer: Self-pay

## 2021-03-31 NOTE — Telephone Encounter (Signed)
Contacted pt to go over image report pt is aware and pt states he hasn't stated taking the zofran

## 2021-04-07 ENCOUNTER — Telehealth: Payer: Self-pay | Admitting: Pharmacist

## 2021-04-07 ENCOUNTER — Ambulatory Visit: Payer: Medicaid Other | Attending: Internal Medicine | Admitting: Pharmacist

## 2021-04-07 ENCOUNTER — Encounter: Payer: Self-pay | Admitting: Pharmacist

## 2021-04-07 ENCOUNTER — Other Ambulatory Visit: Payer: Self-pay

## 2021-04-07 VITALS — BP 119/81 | HR 97

## 2021-04-07 DIAGNOSIS — E1159 Type 2 diabetes mellitus with other circulatory complications: Secondary | ICD-10-CM | POA: Diagnosis not present

## 2021-04-07 DIAGNOSIS — I152 Hypertension secondary to endocrine disorders: Secondary | ICD-10-CM | POA: Diagnosis not present

## 2021-04-07 NOTE — Telephone Encounter (Signed)
Dr. Wynetta Emery,   Saw this patient 04/07/21 for BP check. He is requesting a referral for a new wheelchair d/t his impaired gait. I'm not sure if this is something you have already done for him but he tells me he got his current wheelchair from [place] and therefore [place] Medicaid will not pay to have it fixed. Are we able to send for a new wheelchair?

## 2021-04-07 NOTE — Progress Notes (Signed)
° °  S:    PCP: Dr. Wynetta Emery  Patient arrives in good spirits. Presents to the clinic for hypertension evaluation, counseling, and management.  Patient was referred and last seen by Primary Care Provider on 03/21/2021. BP at that visit was 141/88. Dr. Wynetta Emery increased amlodipine to 10 mg daily.   Medication adherence reported. Pt took his medications today. He tells me that the 10mg  amlodipine was in his latest pill packs that were delivered from his pharmacy.   Current BP Medications include:  amlodipine 10 mg daily, lisinopril-HCTZ 10-12.5 mg daily   Dietary habits include: not fully compliant with salt restriction. He does not add salt at that table but eats frozen meals.  No fast food or take-out per patient. Denies excessive caffeine.  Exercise habits include: none d/t impaired gait Family / Social history:  Fhx:DM Tobacco: never smoker  Alcohol: very rarely    O:  Vitals:   04/07/21 1455  BP: 119/81  Pulse: 97    Home BP readings: 140s-190s/80s-90s. Believes his meter is faulty.   Last 3 Office BP readings: BP Readings from Last 3 Encounters:  04/07/21 119/81  03/21/21 (!) 141/88  01/02/21 (!) 130/91    BMET    Component Value Date/Time   NA 142 03/21/2021 1536   K 3.8 03/21/2021 1536   CL 106 03/21/2021 1536   CO2 23 03/21/2021 1536   GLUCOSE 143 (H) 03/21/2021 1536   GLUCOSE 323 (H) 07/19/2020 1413   BUN 34 (H) 03/21/2021 1536   CREATININE 2.48 (H) 03/21/2021 1536   CREATININE 0.73 04/10/2016 1125   CALCIUM 8.8 03/21/2021 1536   GFRNONAA >60 07/19/2020 1413   GFRNONAA >89 04/10/2016 1125   GFRAA 91 01/27/2020 1209   GFRAA >89 04/10/2016 1125    Renal function: CrCl cannot be calculated (Unknown ideal weight.).  Clinical ASCVD: No  The ASCVD Risk score (Arnett DK, et al., 2019) failed to calculate for the following reasons:   The 2019 ASCVD risk score is only valid for ages 76 to 17   A/P: Hypertension diagnosed currently at goal on current  medications. BP Goal = < 130/80 mmHg. Medication adherence reported. BP today much better with amlodipine increase. Home meter read 152/144 mmHg in the same arm 2 minutes after taking BP with our cuff. I informed the patient that his meter appears to be malfunctioning and he should try to get a new one. No changes in medications today.   -Continued current regimen.  -Counseled on lifestyle modifications for blood pressure control including reduced dietary sodium, increased exercise, adequate sleep.  Results reviewed and written information provided.   Total time in face-to-face counseling 30 minutes.   F/U Clinic Visit with PCP.  Benard Halsted, PharmD, [facility], [place] [phone]

## 2021-04-10 NOTE — Telephone Encounter (Signed)
Pt returned call for an appt.  I tried to get him one but nothing available until March and he needs to be seen asap  CB# [phone]

## 2021-04-10 NOTE — Telephone Encounter (Signed)
Contacted pt to schedule an appt for wheelchair pt didn't answer lvm

## 2021-04-12 ENCOUNTER — Telehealth: Payer: Self-pay | Admitting: Internal Medicine

## 2021-04-12 NOTE — Telephone Encounter (Signed)
Pt has been scheduled for 1/33 at 210pm

## 2021-04-12 NOTE — Telephone Encounter (Signed)
Copied from [place] [id number]. Topic: General - Other >> Apr 11, 2021  4:24 PM Tessa Lerner A wrote: Reason for CRM: The patient would like to speak with Jay'A when possible  The patient would like to further discuss scheduling their appt with Dr. Wynetta Emery  Please contact when available

## 2021-04-12 NOTE — Telephone Encounter (Signed)
Could you contact pt and schedule with Dr. Wynetta Emery for wheelchair assessment

## 2021-04-17 ENCOUNTER — Other Ambulatory Visit: Payer: Self-pay

## 2021-04-17 ENCOUNTER — Ambulatory Visit: Payer: Medicaid Other | Attending: Internal Medicine | Admitting: Internal Medicine

## 2021-04-17 ENCOUNTER — Encounter: Payer: Self-pay | Admitting: Internal Medicine

## 2021-04-17 VITALS — BP 149/102 | HR 104 | Resp 16 | Wt 224.2 lb

## 2021-04-17 DIAGNOSIS — E1159 Type 2 diabetes mellitus with other circulatory complications: Secondary | ICD-10-CM

## 2021-04-17 DIAGNOSIS — K3184 Gastroparesis: Secondary | ICD-10-CM | POA: Diagnosis not present

## 2021-04-17 DIAGNOSIS — I152 Hypertension secondary to endocrine disorders: Secondary | ICD-10-CM

## 2021-04-17 DIAGNOSIS — R1110 Vomiting, unspecified: Secondary | ICD-10-CM

## 2021-04-17 DIAGNOSIS — E1142 Type 2 diabetes mellitus with diabetic polyneuropathy: Secondary | ICD-10-CM

## 2021-04-17 DIAGNOSIS — G8191 Hemiplegia, unspecified affecting right dominant side: Secondary | ICD-10-CM

## 2021-04-17 DIAGNOSIS — E1121 Type 2 diabetes mellitus with diabetic nephropathy: Secondary | ICD-10-CM

## 2021-04-17 MED ORDER — LANTUS SOLOSTAR 100 UNIT/ML ~~LOC~~ SOPN: 10.0000 [IU] | PEN_INJECTOR | Freq: Every day | SUBCUTANEOUS | 11 refills | Status: DC

## 2021-04-17 MED ORDER — METOCLOPRAMIDE HCL 5 MG PO TABS: ORAL_TABLET | ORAL | 5 refills | Status: DC

## 2021-04-17 MED ORDER — PEN NEEDLES 31G X 8 MM MISC: 6 refills | Status: DC

## 2021-04-17 NOTE — Patient Instructions (Addendum)
Stop Trulicity. Stop Zofran (Odansetron)

## 2021-04-17 NOTE — Progress Notes (Signed)
Patient ID: [name] [name], male    DOB: Oct 10, 1985  MRN: [id number]  CC: Wheelchair evaluation  Subjective: [name] [name] is a 36 y.o. 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  Patient had initially scheduled this visit for wheelchair evaluation.  However he does not have any forms with him.  He tells me that he has a motorized wheelchair that was prescribed for him after he had surgery on his spine in 2020 that left him with right-sided hemiparesis.  He got the chair about 2 years ago.  However he states it stopped working about a year ago.  He has not taken it to any shop to try to get it repaired.  He states that over work-up with [facility] has been trying to see whether company in [place] could fix it for him but she has not had any success.  His apartment that he currently lives and does not have a handicap ramp.  He states he is not going to be there much longer.  Feels he still needs the motorized wheelchair for when he has to walk a distance.  On last visit with me earlier this month, patient complained of nausea/vomiting with meals.  He reported that it could even happen in between meals.  He is also complaining of some acid reflux symptoms.  I suspected gastroparesis.  I placed him on Zofran and ordered a gastric emptying study.  This revealed slightly delayed gastric emptying suggestive of gastroparesis.  We also did a gallbladder ultrasound which revealed minimal cholelithiasis without evidence of acute cholecystitis.   This morning he woke up feeling nauseated and queasy on his stomach.  He did not eat anything this AM.  Last meal was steak with mac &  cheese for dinner last night.  He tells me he does not eat breakfast because he usually wakes up around 12 noon during the day.  Does not eat any lunch.  He tells me that his main meal is dinner.  He continues to have these episodes of nausea/vomiting.  He denies any abdominal pains with meals.  He states that his abdomen is sore only when he has vomiting.  He does not drink alcoholic beverages.  He is not sure whether he is taking Zofran or not.  He is limited finances and gets food stamps..  BS at this time with his Dexcom is 168.  Reports BS have been in 120-140s Taking Jardiance and Trulicity.  We had stopped the metformin after his last visit.  His GFR was in the low 30s.  Was suppose to have appt with Nephrology later today. He called them to cancel and rescheduled because he just realized this morning that he would not have sufficient time after coming to this appointment to get over there to be seen.     HTN: Blood pressure is elevated.  He told me that he took his medicines already this AM.  On his recent visit with me we increase the amlodipine to 10 mg.  However he thinks he is still on the 5 mg because his pharmacy has not delivered the increased dose as yet.  He gets his medications in blister pack.    Marland Kitchen  Patient Active Problem List   Diagnosis Date Noted   Abnormality of gait 10/11/2020   Macroalbuminuric diabetic nephropathy ([place]) 08/10/2020   Hemiparesis of right dominant side due to non-cerebrovascular etiology ([place]) 08/10/2020   Diabetic ulcer of left midfoot associated with diabetes mellitus due to underlying condition, limited to breakdown of skin ([place]) 08/10/2020   Cellulitis, leg 03/17/2020   Cellulitis of right leg 03/17/2020   Uncontrolled type 2 diabetes mellitus with complication, with long-term current use of insulin    Abscess    Cellulitis 12/05/2019   Cellulitis of left lower extremity    Abscess of left lower extremity    Uncontrolled type 2 diabetes mellitus  with hyperglycemia (HCC)    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 2 diabetes mellitus ([place])    Cellulitis of foot, left 11/22/2016   Diabetic neuropathy ([place]) 11/22/2016   Fracture of 2nd metatarsal 11/22/2016   Fracture of 3rd metatarsal 11/22/2016   Postprocedural non-healing wound 11/22/2016   Acute cystitis with positive culture    Cutaneous abscess of left foot    Poorly controlled type 2 diabetes mellitus with complication ([place]) 78/29/5621   Hyperlipidemia 10/17/2016   Depression 10/17/2016   Neck pain    Asthma 04/11/2016   Uncontrolled type 2 diabetes mellitus with complication    Obesity, morbid ([place]) 09/06/2015   Hypertension associated with diabetes ([place]) 09/06/2015     Current Outpatient Medications on File Prior to Visit  Medication Sig Dispense Refill   acetaminophen (TYLENOL) 325 MG tablet Take 2 tablets (650 mg total) by mouth every 6 (six) hours as needed for mild pain or fever (for pain). 10 tablet 0   amLODipine (NORVASC) 10 MG tablet Take 1 tablet (10 mg total) by mouth daily. 30 tablet 6   Blood Glucose Monitoring Suppl (TRUE METRIX METER) w/Device KIT 1 each by Does not apply route in the morning and at bedtime. (Patient not taking: Reported on 12/01/2020) 1 kit 0   Continuous Blood Gluc Receiver (DEXCOM G6 RECEIVER) DEVI 1 Device by Does not apply route daily. 1 each 0   Continuous Blood Gluc Sensor (DEXCOM G6 SENSOR) MISC 1 packet by Does not apply route daily. 3 each 11   Continuous Blood Gluc Transmit (DEXCOM G6 TRANSMITTER) MISC USE AS DIRECTED DAILY 1 each 4   empagliflozin (JARDIANCE) 10 MG TABS tablet 1 tab PO Q Mon/Wed/Frid.  Stop Metformin. 12 tablet 2   gabapentin (NEURONTIN) 300 MG capsule Take 1 capsule (300 mg total) by mouth 2 (two) times daily. 180 capsule 1   glucose blood (TRUE METRIX BLOOD GLUCOSE TEST) test strip Use as instructed (Patient not taking: Reported on 12/01/2020) 100 each 12   lisinopril-hydrochlorothiazide (ZESTORETIC) 10-12.5 MG tablet Take 1  tablet by mouth daily. 90 tablet 1   ondansetron (ZOFRAN) 4 MG tablet Take 1 tablet (4 mg total) by mouth 2 (two) times daily as needed for nausea or vomiting. 30 tablet 2   pravastatin (PRAVACHOL) 20 MG tablet TAKE 1 TABLET (20 MG TOTAL) BY MOUTH DAILY. 90 tablet 1   No current facility-administered medications on file prior to visit.    No Known Allergies  Social History   Socioeconomic History   Marital status: Single    Spouse name: Not on file   Number of children: Not on file   Years of education: Not on file   Highest education level: Not on file  Occupational History   Not on file  Tobacco Use   Smoking status: Never  Smokeless tobacco: Never  Vaping Use   Vaping Use: Never used  Substance and Sexual Activity   Alcohol use: Yes    Comment: 11/06/2016 "I'll drink a little at parties; maybe 5 times/year"   Drug use: Yes    Types: Marijuana    Comment: 11/06/2016 "stopped ~ 1 month ago"   Sexual activity: Never    Birth control/protection: Diaphragm  Other Topics Concern   Not on file  Social History Narrative   Not on file   Social Determinants of Health   Financial Resource Strain: Not on file  Food Insecurity: Not on file  Transportation Needs: Not on file  Physical Activity: Not on file  Stress: Not on file  Social Connections: Not on file  Intimate Partner Violence: Not on file    Family History  Problem Relation Age of Onset   Diabetes type II Mother    Diabetes type II Father     Past Surgical History:  Procedure Laterality Date   AMPUTATION Left 11/05/2016   Procedure: AMPUTATION LEFT GREAT TOE;  Surgeon: Newt Minion, MD;  Location: [facility];  Service: Orthopedics;  Laterality: Left;   CATARACT EXTRACTION Left 07/2020   DRESSING CHANGE UNDER ANESTHESIA N/A 04/13/2016   Procedure: DRESSING CHANGE UNDER ANESTHESIA;  Surgeon: Johnathan Hausen, MD;  Location: [facility];  Service: General;  Laterality: N/A;   INCISION AND DRAINAGE ABSCESS Right 09/03/2015    Procedure: INCISION AND DRAINAGE ABSCESS;  Surgeon: Roseanne Kaufman, MD;  Location: [facility];  Service: Orthopedics;  Laterality: Right;   INCISION AND DRAINAGE PERIRECTAL ABSCESS N/A 04/11/2016   Procedure: IRRIGATION AND DEBRIDEMENT RECTAL ABSCESS;  Surgeon: Johnathan Hausen, MD;  Location: [facility];  Service: General;  Laterality: N/A;   [place]; 1989   x2    IRRIGATION AND DEBRIDEMENT ABSCESS Left 08/31/2015   Procedure: IRRIGATION AND DEBRIDEMENT ABSCESS LEFT SHOULDER;  Surgeon: Alphonsa Overall, MD;  Location: [facility];  Service: General;  Laterality: Left;   WOUND DEBRIDEMENT N/A 02/24/2016   Procedure: SACRAL WOUND DEBRIDEMENT;  Surgeon: Georganna Skeans, MD;  Location: [facility];  Service: General;  Laterality: N/A;    ROS: Review of Systems Negative except as stated above  PHYSICAL EXAM: BP (!) 149/102    Pulse (!) 104    Resp 16    Wt 224 lb 3.2 oz (101.7 kg)    SpO2 98%    BMI 35.64 kg/m   Physical Exam   General appearance -patient had 2 episodes of vomiting in the trash can when I walked into the room.  Blood sugar was 168. Mental status -he is alert and oriented and answers questions appropriately but is somewhat forgetful. Mouth -moist. Chest - clear to auscultation, no wheezes, rales or rhonchi, symmetric air entry Heart - normal rate, regular rhythm, normal S1, S2, no murmurs, rubs, clicks or gallops Abdomen -normal bowel sounds, soft, nontender, nondistended.  No organomegaly  CMP Latest Ref Rng & Units 03/21/2021 11/24/2020 07/19/2020  Glucose 70 - 99 mg/dL 143(H) 151(H) 323(H)  BUN 6 - 20 mg/dL 34(H) 35(H) 25(H)  Creatinine 0.76 - 1.27 mg/dL 2.48(H) 2.12(H) 1.45(H)  Sodium 134 - 144 mmol/L 142 141 136  Potassium 3.5 - 5.2 mmol/L 3.8 4.6 4.0  Chloride 96 - 106 mmol/L 106 105 104  CO2 20 - 29 mmol/L _0 Calcium 8.7 - 10.2 mg/dL 8.8 9.3 8.5(L)  Total Protein 6.0 - 8.5 g/dL 6.0 - 6.5  Total Bilirubin 0.0 - 1.2  mg/dL <0.2 - 0.3  Alkaline Phos 44 - 121 IU/L 91  - 80  AST 0 - 40 IU/L 13 - 11(L)  ALT 0 - 44 IU/L 12 - 12   Lipid Panel     Component Value Date/Time   CHOL 149 03/21/2021 1536   TRIG 104 03/21/2021 1536   HDL 37 (L) 03/21/2021 1536   CHOLHDL 4.0 03/21/2021 1536   CHOLHDL 4.6 12/06/2019 0749   VLDL 23 12/06/2019 0749   LDLCALC 93 03/21/2021 1536    CBC    Component Value Date/Time   WBC 10.3 03/21/2021 1536   WBC 10.2 07/19/2020 1413   RBC 3.54 (L) 03/21/2021 1536   RBC 4.13 (L) 07/19/2020 1413   HGB 10.5 (L) 03/21/2021 1536   HCT 30.0 (L) 03/21/2021 1536   PLT 308 03/21/2021 1536   MCV 85 03/21/2021 1536   MCH 29.7 03/21/2021 1536   MCH 28.8 07/19/2020 1413   MCHC 35.0 03/21/2021 1536   MCHC 35.1 07/19/2020 1413   RDW 13.0 03/21/2021 1536   LYMPHSABS 1.9 07/19/2020 1413   LYMPHSABS 1.7 01/27/2020 1209   MONOABS 0.5 07/19/2020 1413   EOSABS 0.2 07/19/2020 1413   EOSABS 0.1 01/27/2020 1209   BASOSABS 0.1 07/19/2020 1413   BASOSABS 0.1 01/27/2020 1209    ASSESSMENT AND PLAN: 1. Recurrent vomiting Most likely due to Trulicity and gastroparesis. -Advised to stop Trulicity. Stop Zofran. Discussed putting him on Reglan to take 30 minutes before lunch and dinner.  Advised that the medication can cause movement disorder when used long-term and high doses.   - Comprehensive metabolic panel - Lipase  2. Gastroparesis See #1 above. - metoCLOPramide (REGLAN) 5 MG tablet; Twice a day 30 mins prior to lunch and dinner  Dispense: 60 tablet; Refill: 5  3. Hypertension associated with diabetes ([place]) Not at goal.  Advised patient to check his blister pack when he gets home and the names and doses of the medications.  In particular I want to know whether he has the amlodipine 10 mg.  Continue lisinopril/hydrochlorothiazide  4. Type 2 diabetes mellitus with peripheral neuropathy (HCC) Stop Trulicity.  We will start him on glargine insulin 10 units at bedtime.  Went over signs and symptoms of hypoglycemia.  Check chemistry  today to make sure he is still doing okay on the Jardiance We will have him follow-up with our clinical pharmacist in 2 weeks to see how his blood sugars are doing on the Lantus and Jardiance. - POCT glycosylated hemoglobin (Hb A1C) - insulin glargine (LANTUS SOLOSTAR) 100 UNIT/ML Solostar Pen; Inject 10 Units into the skin at bedtime.  Dispense: 15 mL; Refill: 11 - Insulin Pen Needle (PEN NEEDLES) 31G X 8 MM MISC; UAD  Dispense: 100 each; Refill: 6  5. Macroalbuminuric diabetic nephropathy ([place]) Strongly advised that he follows up with rescheduling the appointment with the nephrologist.  6. Hemiparesis of right dominant side due to non-cerebrovascular etiology [facility]) Advised patient that Medicaid may not pay for him to get a motorized wheelchair given that the 1 that he has was only 35 years old.  We will refer him to physical therapy for evaluation to see whether he still qualifies and needs a motorized wheelchair. - Ambulatory referral to Physical Therapy     Patient was given the opportunity to ask questions.  Patient verbalized understanding of the plan and was able to repeat key elements of the plan.   Orders Placed This Encounter  Procedures   Comprehensive metabolic panel  Lipase   Ambulatory referral to Physical Therapy   POCT glycosylated hemoglobin (Hb A1C)     Requested Prescriptions   Signed Prescriptions Disp Refills   insulin glargine (LANTUS SOLOSTAR) 100 UNIT/ML Solostar Pen 15 mL 11    Sig: Inject 10 Units into the skin at bedtime.   Insulin Pen Needle (PEN NEEDLES) 31G X 8 MM MISC 100 each 6    Sig: UAD   metoCLOPramide (REGLAN) 5 MG tablet 60 tablet 5    Sig: Twice a day 30 mins prior to lunch and dinner    Return in about 7 weeks (around 06/05/2021) for Appt with [facility] in 2 wks for BS check.  Karle Plumber, MD, FACP

## 2021-04-18 ENCOUNTER — Encounter: Payer: Self-pay | Admitting: Cardiovascular Disease

## 2021-04-18 ENCOUNTER — Ambulatory Visit (INDEPENDENT_AMBULATORY_CARE_PROVIDER_SITE_OTHER): Payer: Medicaid Other | Admitting: Cardiovascular Disease

## 2021-04-18 VITALS — BP 144/98 | HR 98 | Ht 66.0 in | Wt 222.0 lb

## 2021-04-18 DIAGNOSIS — R0789 Other chest pain: Secondary | ICD-10-CM

## 2021-04-18 DIAGNOSIS — R079 Chest pain, unspecified: Secondary | ICD-10-CM | POA: Diagnosis not present

## 2021-04-18 LAB — COMPREHENSIVE METABOLIC PANEL
ALT: 16 IU/L (ref 0–44)
AST: 19 IU/L (ref 0–40)
Albumin/Globulin Ratio: 1.5 (ref 1.2–2.2)
Albumin: 3.9 g/dL — ABNORMAL LOW (ref 4.0–5.0)
Alkaline Phosphatase: 85 IU/L (ref 44–121)
BUN/Creatinine Ratio: 14 (ref 9–20)
BUN: 37 mg/dL — ABNORMAL HIGH (ref 6–20)
Bilirubin Total: <0.2 mg/dL (ref 0.0–1.2)
CO2: 23 mmol/L (ref 20–29)
Calcium: 9.3 mg/dL (ref 8.7–10.2)
Chloride: 108 mmol/L — ABNORMAL HIGH (ref 96–106)
Creatinine, Ser: 2.71 mg/dL — ABNORMAL HIGH (ref 0.76–1.27)
Globulin, Total: 2.6 g/dL (ref 1.5–4.5)
Glucose: 129 mg/dL — ABNORMAL HIGH (ref 70–99)
Potassium: 5 mmol/L (ref 3.5–5.2)
Sodium: 143 mmol/L (ref 134–144)
Total Protein: 6.5 g/dL (ref 6.0–8.5)
eGFR: 31 mL/min/{1.73_m2} — ABNORMAL LOW (ref >59)

## 2021-04-18 LAB — LIPASE: Lipase: 62 U/L (ref 13–78)

## 2021-04-18 MED ORDER — CARVEDILOL 6.25 MG PO TABS: 6.2500 mg | ORAL_TABLET | Freq: Two times a day (BID) | ORAL | 3 refills | Status: DC

## 2021-04-18 NOTE — Patient Instructions (Signed)
Medication Instructions:  Your physician has recommended you make the following change in your medication:   START Coreg (Carvedilol) 12.5 taking 1 twice a day   *If you need a refill on your cardiac medications before your next appointment, please call your pharmacy*   Lab Work: None ordered  If you have labs (blood work) drawn today and your tests are completely normal, you will receive your results only by: [facility] (if you have MyChart) OR A paper copy in the mail If you have any lab test that is abnormal or we need to change your treatment, we will call you to review the results.   Testing/Procedures: Your physician has requested that you have a lexiscan myoview. For further information please visit [email or web address]. Please follow instruction sheet,  BELOW:    You are scheduled for a Myocardial Perfusion Imaging Study  Please arrive 15 minutes prior to your appointment time for registration and insurance purposes.  The test will take approximately 3 to 4 hours to complete; you may bring reading material.  If someone comes with you to your appointment, they will need to remain in the main lobby due to limited space in the testing area. **If you are pregnant or breastfeeding, please notify the nuclear lab prior to your appointment**  How to prepare for your Myocardial Perfusion Test: Do not eat or drink 3 hours prior to your test, except you may have water. Do not consume products containing caffeine (regular or decaffeinated) 12 hours prior to your test. (ex: coffee, chocolate, sodas, tea). Do bring a list of your current medications with you.  If not listed below, you may take your medications as normal. Do wear comfortable clothes (no dresses or overalls) and walking shoes, tennis shoes preferred (No heels or open toe shoes are allowed). Do NOT wear cologne, perfume, aftershave, or lotions (deodorant is allowed). If these instructions are not followed, your test will  have to be rescheduled.     Follow-Up: At [facility], you and your health needs are our priority.  As part of our continuing mission to provide you with exceptional heart care, we have created designated Provider Care Teams.  These Care Teams include your primary Cardiologist (physician) and Advanced Practice Providers (APPs -  Physician Assistants and Nurse Practitioners) who all work together to provide you with the care you need, when you need it.  We recommend signing up for the patient portal called "MyChart".  Sign up information is provided on this After Visit Summary.  MyChart is used to connect with patients for Virtual Visits (Telemedicine).  Patients are able to view lab/test results, encounter notes, upcoming appointments, etc.  Non-urgent messages can be sent to your provider as well.   To learn more about what you can do with MyChart, go to [email or web address].    Your next appointment:   6 month(s)  The format for your next appointment:   In Person  Provider:   Robbie Lis, PA-C, Christen Bame, NP, or Richardson Dopp, PA-C         Other Instructions

## 2021-04-18 NOTE — Progress Notes (Signed)
Cardiology Office Note:    Date:  04/18/2021   ID:  [name] [name], DOB 09-21-1985, MRN [id number]  PCP:  Ladell Pier, MD   [facility] Providers Cardiologist:  Kathrynn Humble to update primary MD,subspecialty MD or APP then REFRESH:1}    Referring MD: Ladell Pier, MD   Chief Complaint  Patient presents with   Chest Pain     History of Present Illness:    [name] [name] is a 36 y.o. male with a hx of diabetes mellitus, hypertension, chest pain.  Has had DM for 14 years .  Has had poor control over the years.   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   Is on disability  ,  unable to work , unable to exercise   No angina like chest pain but he is not able to exert  himself    Past Medical History:  Diagnosis Date   Arthritis    "neck; maybe in my fingers" (11/06/2016)   Asthma    Bipolar disorder ([place])    "IED: intentional deficit disorder"   Depression    Heart murmur    "when I was a child"   Hypertension    Type II diabetes mellitus ([place])     Past Surgical History:  Procedure Laterality Date   AMPUTATION Left 11/05/2016   Procedure: AMPUTATION LEFT GREAT TOE;  Surgeon: Newt Minion, MD;  Location: [place];  Service: Orthopedics;  Laterality: Left;   CATARACT EXTRACTION Left 07/2020   DRESSING CHANGE UNDER ANESTHESIA N/A 04/13/2016   Procedure: DRESSING CHANGE UNDER ANESTHESIA;  Surgeon: Johnathan Hausen, MD;  Location: [facility];  Service: General;  Laterality: N/A;   INCISION AND DRAINAGE ABSCESS Right 09/03/2015   Procedure: INCISION AND DRAINAGE ABSCESS;  Surgeon: Roseanne Kaufman, MD;  Location: [facility];  Service: Orthopedics;  Laterality: Right;   INCISION AND DRAINAGE PERIRECTAL ABSCESS N/A 04/11/2016   Procedure: IRRIGATION AND DEBRIDEMENT RECTAL ABSCESS;  Surgeon: Johnathan Hausen, MD;  Location:  [facility];  Service: General;  Laterality: N/A;   [place]; 1989   x2    IRRIGATION AND DEBRIDEMENT ABSCESS Left 08/31/2015   Procedure: IRRIGATION AND DEBRIDEMENT ABSCESS LEFT SHOULDER;  Surgeon: Alphonsa Overall, MD;  Location: [facility];  Service: General;  Laterality: Left;   WOUND DEBRIDEMENT N/A 02/24/2016   Procedure: SACRAL WOUND DEBRIDEMENT;  Surgeon: Georganna Skeans, MD;  Location: [place];  Service: General;  Laterality: N/A;    Current Medications: Current Meds  Medication Sig   acetaminophen (TYLENOL) 325 MG tablet Take 2 tablets (650 mg total) by mouth every 6 (six) hours as needed for mild pain or fever (for pain).   amLODipine (NORVASC) 10 MG tablet Take 1 tablet (10 mg total) by mouth daily.   Blood Glucose Monitoring Suppl (TRUE METRIX METER) w/Device KIT 1 each by Does not apply route in the morning and at bedtime.   carvedilol (COREG) 6.25 MG tablet Take 1 tablet (6.25 mg total) by mouth 2 (two) times daily.   Continuous Blood Gluc Receiver (DEXCOM G6 RECEIVER) DEVI 1 Device by Does not apply route daily.   Continuous Blood Gluc Sensor (DEXCOM G6 SENSOR) MISC 1 packet by Does not apply route daily.   Continuous Blood Gluc Transmit (DEXCOM G6 TRANSMITTER) MISC  USE AS DIRECTED DAILY   empagliflozin (JARDIANCE) 10 MG TABS tablet 1 tab PO Q Mon/Wed/Frid.  Stop Metformin.   gabapentin (NEURONTIN) 300 MG capsule Take 1 capsule (300 mg total) by mouth 2 (two) times daily.   glucose blood (TRUE METRIX BLOOD GLUCOSE TEST) test strip Use as instructed   insulin glargine (LANTUS SOLOSTAR) 100 UNIT/ML Solostar Pen Inject 10 Units into the skin at bedtime.   Insulin Pen Needle (PEN NEEDLES) 31G X 8 MM MISC UAD   lisinopril-hydrochlorothiazide (ZESTORETIC) 10-12.5 MG tablet Take 1 tablet by mouth daily.   metoCLOPramide (REGLAN) 5 MG tablet Twice a day 30 mins prior to lunch and dinner   ondansetron (ZOFRAN) 4 MG tablet Take 1 tablet (4 mg total) by mouth 2 (two) times daily as  needed for nausea or vomiting.   pravastatin (PRAVACHOL) 20 MG tablet TAKE 1 TABLET (20 MG TOTAL) BY MOUTH DAILY.     Allergies:   Patient has no known allergies.   Social History   Socioeconomic History   Marital status: Single    Spouse name: Not on file   Number of children: Not on file   Years of education: Not on file   Highest education level: Not on file  Occupational History   Not on file  Tobacco Use   Smoking status: Never   Smokeless tobacco: Never  Vaping Use   Vaping Use: Never used  Substance and Sexual Activity   Alcohol use: Yes    Comment: 11/06/2016 "I'll drink a little at parties; maybe 5 times/year"   Drug use: Yes    Types: Marijuana    Comment: 11/06/2016 "stopped ~ 1 month ago"   Sexual activity: Never    Birth control/protection: Diaphragm  Other Topics Concern   Not on file  Social History Narrative   Not on file   Social Determinants of Health   Financial Resource Strain: Not on file  Food Insecurity: Not on file  Transportation Needs: Not on file  Physical Activity: Not on file  Stress: Not on file  Social Connections: Not on file     Family History: The patient's family history includes Coronary artery disease in his mother; Diabetes type II in his father and mother.  ROS:   Please see the history of present illness.     All other systems reviewed and are negative.  EKGs/Labs/Other Studies Reviewed:    The following studies were reviewed today:    EKG: April 18, 2021: Normal sinus rhythm at 98.  No ST or T wave changes.  Recent Labs: 03/21/2021: Hemoglobin 10.5; Platelets 308 04/17/2021: ALT 16; BUN 37; Creatinine, Ser 2.71; Potassium 5.0; Sodium 143  Recent Lipid Panel    Component Value Date/Time   CHOL 149 03/21/2021 1536   TRIG 104 03/21/2021 1536   HDL 37 (L) 03/21/2021 1536   CHOLHDL 4.0 03/21/2021 1536   CHOLHDL 4.6 12/06/2019 0749   VLDL 23 12/06/2019 0749   LDLCALC 93 03/21/2021 1536     Risk  Assessment/Calculations:           Physical Exam:    VS:  BP (!) 144/98 (BP Location: Left Arm, Patient Position: Sitting, Cuff Size: Normal)    Pulse 98    Ht $R'5\' 6"'Hr$  (1.676 m)    Wt 222 lb (100.7 kg)    SpO2 99%    BMI 35.83 kg/m     Wt Readings from Last 3 Encounters:  04/18/21 222 lb (100.7 kg)  04/17/21 224 lb  3.2 oz (101.7 kg)  03/21/21 225 lb 9.6 oz (102.3 kg)     GEN:  [place],  moderately obese,  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 Other chest pain   2. Chest pain, unspecified type    PLAN:    In order of problems listed above:  Chest discomfort: The patient describes some right-sided chest twinges/chest pain.  While this sounds somewhat atypical he does have diabetes mellitus and the symptoms are not always classic angina.  We will schedule him for a Lexiscan Myoview study.  His heart rate is fairly fast.   He had very poorly controlled diabetes for many years.  Only recently did a get it under better control.  2.  Hypertension: He still eats a fairly high salt diet.  We will have him cut out his hotdogs, fast foods.  We will also have him reduce the amount of beef and ground beef that he eats and start eating more chicken.  We will start him on carvedilol 12.5 mg twice a day.  11 return to see an APP in 6 months.      Shared Decision Making/Informed Consent The risks [chest pain, shortness of breath, cardiac arrhythmias, dizziness, blood pressure fluctuations, myocardial infarction, stroke/transient ischemic attack, nausea, vomiting, allergic reaction, radiation exposure, metallic  taste sensation and life-threatening complications (estimated to be 1 in 10,000)], benefits (risk stratification, diagnosing coronary artery disease, treatment guidance) and alternatives of a nuclear stress test were discussed in detail with [name] [name] and he agrees to proceed.    Medication Adjustments/Labs and Tests Ordered: Current medicines are reviewed at length with the patient today.  Concerns regarding medicines are outlined above.  Orders Placed This Encounter  Procedures   MYOCARDIAL PERFUSION IMAGING   EKG 12-Lead   Meds ordered this encounter  Medications   carvedilol (COREG) 6.25 MG tablet    Sig: Take 1 tablet (6.25 mg total) by mouth 2 (two) times daily.    Dispense:  180 tablet    Refill:  3     Patient Instructions  Medication Instructions:  Your physician has recommended you make the following change in your medication:   START Coreg (Carvedilol) 12.5 taking 1 twice a day   *If you need a refill on your cardiac medications before your next appointment, please call your pharmacy*   Lab Work: None ordered  If you have labs (blood work) drawn today and your tests are completely normal, you will receive your results only by: [place] (if you have MyChart) OR A paper copy in the mail If you have any lab test that is abnormal or we need to change your treatment, we will call you to review the results.   Testing/Procedures: Your physician has requested that you have a lexiscan myoview. For further information please visit [email or web address]. Please follow instruction sheet,  BELOW:    You are scheduled for a Myocardial Perfusion Imaging Study  Please arrive 15 minutes prior to your appointment time for registration and insurance purposes.  The test will take approximately 3 to 4 hours to complete; you may bring reading material.  If someone comes  with you to your appointment, they will need to remain in the main lobby due to limited space in the testing  area. **If you are pregnant or breastfeeding, please notify the nuclear lab prior to your appointment**  How to prepare for your Myocardial Perfusion Test: Do not eat or drink 3 hours prior to your test, except you may have water. Do not consume products containing caffeine (regular or decaffeinated) 12 hours prior to your test. (ex: coffee, chocolate, sodas, tea). Do bring a list of your current medications with you.  If not listed below, you may take your medications as normal. Do wear comfortable clothes (no dresses or overalls) and walking shoes, tennis shoes preferred (No heels or open toe shoes are allowed). Do NOT wear cologne, perfume, aftershave, or lotions (deodorant is allowed). If these instructions are not followed, your test will have to be rescheduled.     Follow-Up: At [facility], you and your health needs are our priority.  As part of our continuing mission to provide you with exceptional heart care, we have created designated Provider Care Teams.  These Care Teams include your primary Cardiologist (physician) and Advanced Practice Providers (APPs -  Physician Assistants and Nurse Practitioners) who all work together to provide you with the care you need, when you need it.  We recommend signing up for the patient portal called "MyChart".  Sign up information is provided on this After Visit Summary.  MyChart is used to connect with patients for Virtual Visits (Telemedicine).  Patients are able to view lab/test results, encounter notes, upcoming appointments, etc.  Non-urgent messages can be sent to your provider as well.   To learn more about what you can do with MyChart, go to [email or web address].    Your next appointment:   6 month(s)  The format for your next appointment:   In Person  Provider:   Robbie Lis, PA-C, Christen Bame, NP, or Richardson Dopp, PA-C         Other Instructions     Signed, Mertie Moores, MD  04/18/2021 3:30 PM    [place]

## 2021-04-19 ENCOUNTER — Telehealth: Payer: Self-pay | Admitting: Internal Medicine

## 2021-04-19 NOTE — Telephone Encounter (Signed)
Phone call placed to patient today.  I left a message on his voicemail informing him that his kidney function is still abnormal and slightly worse compared to when last checked.  It is very important that he call and reschedule his appointment with the kidney specialist. -Advised that he drink several glasses of water daily to stay hydrated. -Reminded him that we discontinued the Trulicity due to recurrent nausea and vomiting and gastroparesis.  Advised that he start the insulin on the day that he would have been due for his next Trulicity shot.

## 2021-04-25 ENCOUNTER — Telehealth (HOSPITAL_COMMUNITY): Payer: Self-pay

## 2021-04-25 NOTE — Telephone Encounter (Signed)
Attempted to contact the patient, there was no answer. I will try again later. S.Elaina Cara EMTP

## 2021-04-27 ENCOUNTER — Ambulatory Visit (HOSPITAL_COMMUNITY): Payer: Medicaid Other | Attending: Cardiology

## 2021-04-27 ENCOUNTER — Other Ambulatory Visit: Payer: Self-pay

## 2021-04-27 DIAGNOSIS — R079 Chest pain, unspecified: Secondary | ICD-10-CM | POA: Diagnosis not present

## 2021-04-27 LAB — MYOCARDIAL PERFUSION IMAGING
Base ST Depression (mm): 0 mm
LV dias vol: 101 mL (ref 62–150)
LV sys vol: 44 mL
Nuc Stress EF: 56 %
Peak HR: 104 {beats}/min
Rest HR: 91 {beats}/min
Rest Nuclear Isotope Dose: 9.7 mCi
SDS: 1
SRS: 0
SSS: 1
ST Depression (mm): 0 mm
Stress Nuclear Isotope Dose: 30.7 mCi
TID: 1.18

## 2021-04-27 MED ORDER — REGADENOSON 0.4 MG/5ML IV SOLN
0.4000 mg | Freq: Once | INTRAVENOUS | Status: AC
  Administered 2021-04-27: 0.4 mg via INTRAVENOUS

## 2021-04-27 MED ORDER — AMINOPHYLLINE 25 MG/ML IV SOLN
75.0000 mg | Freq: Once | INTRAVENOUS | Status: AC
  Administered 2021-04-27: 75 mg via INTRAVENOUS

## 2021-04-27 MED ORDER — TECHNETIUM TC 99M TETROFOSMIN IV KIT
9.7000 | PACK | Freq: Once | INTRAVENOUS | Status: AC | PRN
  Administered 2021-04-27: 9.7 via INTRAVENOUS
  Filled 2021-04-27: qty 10

## 2021-04-27 MED ORDER — TECHNETIUM TC 99M TETROFOSMIN IV KIT
30.7000 | PACK | Freq: Once | INTRAVENOUS | Status: AC | PRN
  Administered 2021-04-27: 30.7 via INTRAVENOUS
  Filled 2021-04-27: qty 31

## 2021-05-06 NOTE — Progress Notes (Signed)
S:    [name] [name] is a 36 y.o. male who presents for diabetes evaluation, education, and management. PMH is significant for T2DM, HTN, HLD, neuropathy, gastroparesis, hemiparesis of R side. Patient was referred and last seen by Primary Care Provider, Dr. Wynetta Emery, on 04/17/21. At last visit, Trulicity was discontinued due to recurrent N/V and switched to Lantus 10 units daily. Has been recently seen by pharmacy clinic for HTN management.   Today, patient arrives in good spirits and presents without assistance. Reports improved N/V since stopping Trulicity and improving diet (avoiding foods that cause acid reflux). He has only been on his new BP medications for about a week due to delays with his pharmacy (his cardiologist started carvedilol 04/18/21).   Family/Social History:  Never smoker.  T2DM in mother and father, CAD in mother.   Current diabetes medications include: Jardiance 10 mg MWF, Lantus 10 units daily (taking 8 units) DM medications in the past: Trulicity (discontinued due to N/V), metformin (eGFR in the low 30s)  Current hypertension medications include: amlodipine 10 mg daily, lisinopril-HCTZ 10-12.5 mg daily, carvedilol 6.25 mg BID Current hyperlipidemia medications include: pravastatin 20 mg daily  Patient reports adherence with medications. States that he misses his medications once per week, on average. Reports non-adherence in the past with medicines but has been working on improving.   Insurance coverage: [place] Medicaid  Patient denies hypoglycemic events.  Patient reports nocturia (nighttime urination). 4x/night Patient reports neuropathy (nerve pain). Patient reports visual changes with high BP.  Patient reports self foot exams.   Patient reported dietary habits: Eats 1 meals/day. Tries to avoid pork, salt Breakfast: asleep Lunch: usually skips Dinner: meat, carb, sometimes veggies Snacks: bananas Drinks: mostly water, sometimes coffee  Patient-reported  exercise habits: none (partially paralyzed)  O:  BG on Dexcom during visit: 153 Lab Results  Component Value Date   HGBA1C 6.8 03/21/2021   There were no vitals filed for this visit.  Lipid Panel     Component Value Date/Time   CHOL 149 03/21/2021 1536   TRIG 104 03/21/2021 1536   HDL 37 (L) 03/21/2021 1536   CHOLHDL 4.0 03/21/2021 1536   CHOLHDL 4.6 12/06/2019 0749   VLDL 23 12/06/2019 0749   LDLCALC 93 03/21/2021 1536   Clinical Atherosclerotic Cardiovascular Disease (ASCVD): No  The ASCVD Risk score (Arnett DK, et al., 2019) failed to calculate for the following reasons:   The 2019 ASCVD risk score is only valid for ages 75 to 50    A/P: Diabetes longstanding currently controlled given last A1c <7. Patient is able to verbalize appropriate hypoglycemia management plan. Medication adherence appears appropriate. His BG have remained stable since switch over from Trulicity to Lantus.  -Continue Lantus 8 units daily -Extensively discussed pathophysiology of diabetes, recommended lifestyle interventions, dietary effects on blood sugar control.  -Counseled on s/sx of and management of hypoglycemia.  -Next A1c anticipated April 2023.   Hypertension longstanding currently uncontrolled. Blood pressure goal of <130/80 mmHg. Medication adherence reported though he has only been on new BP medication for about a week. Will allow more time to see if this new regimen works well for him.  -Continue current medications.  -Follow up in 1 month for HTN visit  Written patient instructions provided. Patient verbalized understanding of treatment plan. Total time in face to face counseling 22 minutes.    Follow up pharmacist clinic visit in 1 month.   Rebbeca Paul, PharmD PGY2 Ambulatory Care Pharmacy Resident 05/08/2021 2:44 PM

## 2021-05-08 ENCOUNTER — Other Ambulatory Visit: Payer: Self-pay

## 2021-05-08 ENCOUNTER — Ambulatory Visit: Payer: Medicaid Other | Attending: Internal Medicine | Admitting: Pharmacist

## 2021-05-08 VITALS — BP 150/91 | HR 79

## 2021-05-08 DIAGNOSIS — I152 Hypertension secondary to endocrine disorders: Secondary | ICD-10-CM | POA: Diagnosis not present

## 2021-05-08 DIAGNOSIS — E1159 Type 2 diabetes mellitus with other circulatory complications: Secondary | ICD-10-CM

## 2021-05-08 DIAGNOSIS — E1142 Type 2 diabetes mellitus with diabetic polyneuropathy: Secondary | ICD-10-CM

## 2021-05-08 MED ORDER — INSULIN GLARGINE-YFGN 100 UNIT/ML ~~LOC~~ SOPN
PEN_INJECTOR | SUBCUTANEOUS | 0 refills | Status: DC
  Filled 2021-05-08: qty 9, 30d supply, fill #0

## 2021-05-08 MED ORDER — LANTUS SOLOSTAR 100 UNIT/ML ~~LOC~~ SOPN
8.0000 [IU] | PEN_INJECTOR | Freq: Every day | SUBCUTANEOUS | 11 refills | Status: DC
  Filled 2021-05-08: qty 3, 37d supply, fill #0
  Filled 2021-05-09: qty 3, 28d supply, fill #0

## 2021-05-09 ENCOUNTER — Other Ambulatory Visit: Payer: Self-pay

## 2021-05-17 ENCOUNTER — Encounter: Payer: Self-pay | Admitting: Internal Medicine

## 2021-05-17 NOTE — Progress Notes (Signed)
Note received from nephrology Dr. Joylene Grapes.  Patient seen 05/10/2021. ?Diagnosis is CKD: ?Patient appears to have advanced kidney disease that has worsened quickly.  He found it interesting that patient had no proteinuria in 2017 and now has extensive elevation.  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  Plan to do extensive serologic work-up.  If this is negative and GFR remains less than 20 or 25, he does not think a biopsy would be helpful given the advanced stage. ?Agree with lisinopril, glib 180 and Jardiance.  Change Jardiance to daily dosing. ?Patient would likely need dialysis within the next 2 years. ? ?Creatinine 3.28/GFR 24/urine microalbumin 4.2 g. ?On SPEP, patient has elevation of free kappa light chains and free lambda light chains.  HIV screen negative.   ?H/H 10.6/31.8 ?

## 2021-05-30 ENCOUNTER — Other Ambulatory Visit: Payer: Self-pay | Admitting: Internal Medicine

## 2021-05-30 NOTE — Telephone Encounter (Signed)
Requested Prescriptions  ?Pending Prescriptions Disp Refills  ?? pravastatin (PRAVACHOL) 20 MG tablet [Pharmacy Med Name: PRAVASTATIN SODIUM 20 MG ORAL TABLET] 90 tablet 1  ?  Sig: TAKE 1 TABLET (20 MG TOTAL) BY MOUTH DAILY. (BEDTIME)  ?  ? Cardiovascular:  Antilipid - Statins Failed - 05/30/2021 12:52 PM  ?  ?  Failed - Lipid Panel in normal range within the last 12 months  ?  Cholesterol, Total  ?Date Value Ref Range Status  ?03/21/2021 149 100 - 199 mg/dL Final  ? ?LDL Chol Calc (NIH)  ?Date Value Ref Range Status  ?03/21/2021 93 0 - 99 mg/dL Final  ? ?HDL  ?Date Value Ref Range Status  ?03/21/2021 37 (L) >39 mg/dL Final  ? ?Triglycerides  ?Date Value Ref Range Status  ?03/21/2021 104 0 - 149 mg/dL Final  ? ?  ?  ?  Passed - Patient is not pregnant  ?  ?  Passed - Valid encounter within last 12 months  ?  Recent Outpatient Visits   ?      ? 3 weeks ago Hypertension associated with diabetes ([place])  ? [place], RPH-CPP  ? 1 month ago Recurrent vomiting  ? [place] Ladell Pier, MD  ? 1 month ago Hypertension associated with diabetes [facility])  ? [place], RPH-CPP  ? 2 months ago Type 2 diabetes mellitus with peripheral neuropathy ([place])  ? [place] Ladell Pier, MD  ? 4 months ago Hypertension associated with diabetes [facility])  ? [place], RPH-CPP  ?  ?  ?Future Appointments   ?        ? In 3 days Daisy Blossom, Jarome Matin, [place]  ? In 1 month Ladell Pier, MD [place]  ?  ? ?  ?  ?  ? ?

## 2021-06-02 ENCOUNTER — Ambulatory Visit: Payer: Medicaid Other | Attending: Internal Medicine | Admitting: Pharmacist

## 2021-06-02 ENCOUNTER — Other Ambulatory Visit: Payer: Self-pay

## 2021-06-02 ENCOUNTER — Encounter: Payer: Self-pay | Admitting: Pharmacist

## 2021-06-02 VITALS — BP 119/87 | HR 80

## 2021-06-02 DIAGNOSIS — E1142 Type 2 diabetes mellitus with diabetic polyneuropathy: Secondary | ICD-10-CM | POA: Diagnosis not present

## 2021-06-02 NOTE — Progress Notes (Signed)
? ? ?  S:    ?[name] [name] is a 36 y.o. male who presents for diabetes evaluation, education, and management. PMH is significant for T2DM, HTN, HLD, neuropathy, gastroparesis, hemiparesis of R side. Patient was referred and last seen by Primary Care Provider, Dr. Wynetta Emery, on 04/17/21. We saw him on 05/08/2021 and made no changes. ? ?Today, patient arrives in good spirits and presents without assistance. Reports improved N/V since stopping Trulicity and improving diet (avoiding foods that cause acid reflux).  ? ?Family/Social History:  ?Never smoker.  ?T2DM in mother and father, CAD in mother.  ? ?Current diabetes medications include: Jardiance 10 mg MWF, Lantus 10 units daily (taking 8 units) ?DM medications in the past: Trulicity (discontinued due to N/V), metformin (eGFR in the low 30s) ? ?Current hypertension medications include: amlodipine 10 mg daily, lisinopril-HCTZ 10-12.5 mg daily, carvedilol 6.25 mg BID ?Current hyperlipidemia medications include: pravastatin 20 mg daily ? ?Patient reports partial adherence with medications. States that he misses his insulin on most days of the week. He is compliant with his PO medications. ? ?Insurance coverage: [place] Medicaid ? ?Patient denies hypoglycemic events. ? ?Patient reports nocturia (nighttime urination). 4x/night ?Patient reports neuropathy (nerve pain). ?Patient reports visual changes with high BP.  ?Patient reports self foot exams.  ? ?Patient reported dietary habits: Eats 1 meals/day. Tries to avoid pork, salt ?Breakfast: asleep ?Lunch: usually skips ?Dinner: meat, carb, sometimes veggies ?Snacks: bananas ?Drinks: mostly water, sometimes coffee ? ?Patient-reported exercise habits: none (partially paralyzed) ? ?O:  ?BG on Dexcom during visit: 186 ? ?Dexcom data:  ?-most values range from 170-210. Will sometimes drop to the 150s. No readings >300 and none less than 70. ? ?Lab Results  ?Component Value Date  ? HGBA1C 6.8 03/21/2021  ? ?Vitals:  ? 06/02/21 1402  ?BP:  119/87  ?Pulse: 80  ? ?Lipid Panel  ?   ?Component Value Date/Time  ? CHOL 149 03/21/2021 1536  ? TRIG 104 03/21/2021 1536  ? HDL 37 (L) 03/21/2021 1536  ? CHOLHDL 4.0 03/21/2021 1536  ? CHOLHDL 4.6 12/06/2019 0749  ? VLDL 23 12/06/2019 0749  ? [place] 93 03/21/2021 1536  ? ?Clinical Atherosclerotic Cardiovascular Disease (ASCVD): No  ?The ASCVD Risk score (Arnett DK, et al., 2019) failed to calculate for the following reasons: ?  The 2019 ASCVD risk score is only valid for ages 78 to 25  ? ? ?A/P: ?Diabetes longstanding currently controlled given last A1c <7. Patient is able to verbalize appropriate hypoglycemia management plan. Medication adherence appears suboptimal.  ?-Continue Lantus 8 units daily. Discussed strategies patient can use to improve adherence. He was unaware that he could keep his insulin pen that is in-use out at room temperature. He will keep this on his nightstand with his other medications so he'll remember to take the Lantus daily.  ?-Extensively discussed pathophysiology of diabetes, recommended lifestyle interventions, dietary effects on blood sugar control.  ?-Counseled on s/sx of and management of hypoglycemia.  ?-Next A1c anticipated April 2023.  ? ?Hypertension longstanding currently close to goal. Blood pressure goal of <130/80 mmHg. Medication adherence reported . No changes today.  ?-Continue current medications.  ? ?Written patient instructions provided. Patient verbalized understanding of treatment plan. Total time in face to face counseling 20 minutes.   ? ?Follow up pharmacist clinic visit in 1 month.  ? ?Benard Halsted, PharmD, BCACP, CPP ?Clinical Pharmacist ?[place] ?[phone] ? ? ?

## 2021-06-08 ENCOUNTER — Ambulatory Visit: Payer: Medicaid Other | Admitting: Internal Medicine

## 2021-06-09 DIAGNOSIS — M79676 Pain in unspecified toe(s): Secondary | ICD-10-CM

## 2021-06-19 ENCOUNTER — Ambulatory Visit: Payer: Medicaid Other | Admitting: Internal Medicine

## 2021-07-01 ENCOUNTER — Other Ambulatory Visit: Payer: Self-pay | Admitting: Internal Medicine

## 2021-07-03 NOTE — Telephone Encounter (Signed)
Requested Prescriptions  ?Pending Prescriptions Disp Refills  ?? Continuous Blood Gluc Transmit (DEXCOM G6 TRANSMITTER) MISC [Pharmacy Med Name: DEXCOM G6 TRANSMITTER] 1 each 4  ?  Sig: USE AS DIRECTED DAILY  ?  ? Endocrinology: Diabetes - Testing Supplies Passed - 07/01/2021 11:44 AM  ?  ?  Passed - Valid encounter within last 12 months  ?  Recent Outpatient Visits   ?      ? 1 month ago Type 2 diabetes mellitus with peripheral neuropathy ([place])  ? [place], RPH-CPP  ? 1 month ago Hypertension associated with diabetes ([place])  ? [place], RPH-CPP  ? 2 months ago Recurrent vomiting  ? [place] Karle Plumber B, MD  ? 2 months ago Hypertension associated with diabetes [facility])  ? [place], RPH-CPP  ? 3 months ago Type 2 diabetes mellitus with peripheral neuropathy ([place])  ? [place] Ladell Pier, MD  ?  ?  ?Future Appointments   ?        ? In 1 week Daisy Blossom, Jarome Matin, [place]  ? In 1 week Ladell Pier, MD [place]  ?  ? ?  ?  ?  ? ?

## 2021-07-11 ENCOUNTER — Other Ambulatory Visit: Payer: Self-pay

## 2021-07-11 ENCOUNTER — Encounter: Payer: Self-pay | Admitting: Pharmacist

## 2021-07-11 ENCOUNTER — Ambulatory Visit: Payer: Medicaid Other | Attending: Internal Medicine | Admitting: Pharmacist

## 2021-07-11 VITALS — BP 154/81 | HR 76

## 2021-07-11 DIAGNOSIS — E1159 Type 2 diabetes mellitus with other circulatory complications: Secondary | ICD-10-CM | POA: Diagnosis not present

## 2021-07-11 DIAGNOSIS — E1142 Type 2 diabetes mellitus with diabetic polyneuropathy: Secondary | ICD-10-CM | POA: Diagnosis not present

## 2021-07-11 DIAGNOSIS — I152 Hypertension secondary to endocrine disorders: Secondary | ICD-10-CM

## 2021-07-11 LAB — POCT GLYCOSYLATED HEMOGLOBIN (HGB A1C): HbA1c, POC (controlled diabetic range): 6.8 % (ref 0.0–7.0)

## 2021-07-11 MED ORDER — LANTUS SOLOSTAR 100 UNIT/ML ~~LOC~~ SOPN: 5.0000 [IU] | PEN_INJECTOR | Freq: Every day | SUBCUTANEOUS | 11 refills | Status: DC

## 2021-07-11 MED ORDER — CARVEDILOL 12.5 MG PO TABS: 12.5000 mg | ORAL_TABLET | Freq: Two times a day (BID) | ORAL | 1 refills | Status: DC

## 2021-07-11 NOTE — Progress Notes (Signed)
? ? ?  S:    ?Dreyson Mishkin is a 36 y.o. male who presents for diabetes evaluation, education, and management. PMH is significant for T2DM, HTN, HLD, neuropathy, gastroparesis, hemiparesis of R side. Patient was referred and last seen by Primary Care Provider, Dr. Wynetta Emery, on 04/17/21. We saw him on 06/02/2021 and made no changes.  ? ?Today, patient arrives in good spirits and presents without assistance. Reports improved N/V since stopping Trulicity and improving diet (avoiding foods that cause acid reflux). He is still not compliant with Lantus and will, in general, only take it when he sees his CBG is >200. Reports he may miss up to 4-5 doses most weeks.  ? ?Family/Social History:  ?Never smoker.  ?T2DM in mother and father, CAD in mother.  ? ?Current diabetes medications include: Jardiance 10 mg MWF, Lantus 10 units daily (taking 8 units) ? ?Patient reports partial adherence with medications. States that he misses his insulin on most days of the week. He is compliant with his PO medications. ? ?Insurance coverage: [place] [employer] ? ?Patient denies hypoglycemic events. ? ?Patient reports nocturia (nighttime urination). 4x/night ?Patient reports neuropathy (nerve pain). ?Patient reports visual changes with high BP.  ?Patient reports self foot exams.  ? ?Patient reported dietary habits: Eats 1 meals/day. Tries to avoid pork, salt ?Breakfast: asleep ?Lunch: usually skips ?Dinner: meat, carb, sometimes veggies ?Snacks: bananas ?Drinks: mostly water, sometimes coffee ? ?Patient-reported exercise habits: none  ? ?O: ?Lab Results  ?Component Value Date  ? HGBA1C 6.8 07/11/2021  ? ?Vitals:  ? 07/11/21 1451  ?BP: (!) 154/81  ?Pulse: 76  ? ? ?Lipid Panel  ?   ?Component Value Date/Time  ? CHOL 149 03/21/2021 1536  ? TRIG 104 03/21/2021 1536  ? HDL 37 (L) 03/21/2021 1536  ? CHOLHDL 4.0 03/21/2021 1536  ? CHOLHDL 4.6 12/06/2019 0749  ? VLDL 23 12/06/2019 0749  ? [place] 93 03/21/2021 1536  ? ?Clinical Atherosclerotic Cardiovascular  Disease (ASCVD): No  ?The ASCVD Risk score (Arnett DK, et al., 2019) failed to calculate for the following reasons: ?  The 2019 ASCVD risk score is only valid for ages 87 to 44  ? ?A/P: ?Diabetes longstanding currently controlled given A1c <7. Patient is able to verbalize appropriate hypoglycemia management plan. Medication adherence appears suboptimal, however, his A1c is at goal.  ?-Continue Lantus but decrease to 5 units daily. Take in the morning.  ?-Continue Jardiance 10 mg on MWF.  ?-Extensively discussed pathophysiology of diabetes, recommended lifestyle interventions, dietary effects on blood sugar control.  ?-Counseled on s/sx of and management of hypoglycemia.  ?-Next A1c anticipated July, 2023.  ? ?Hypertension longstanding currently above goal. Blood pressure goal of <130/80 mmHg. Medication adherence reported .  ?-Increase carvedilol to 12.5 mg BID.   ?-Continue amlodipine 10 mg daily, lisinopril-HCTZ 10-12.5 mg daily.  ? ?Written patient instructions provided. Patient verbalized understanding of treatment plan. Total time in face to face counseling 20 minutes.   ? ?Follow up pharmacist clinic visit in 1 month.  ? ?Benard Halsted, PharmD, BCACP, CPP ?Clinical Pharmacist ?[facility] ?[phone] ? ? ?

## 2021-07-13 ENCOUNTER — Ambulatory Visit: Payer: Medicaid Other | Attending: Internal Medicine | Admitting: Physical Therapy

## 2021-07-13 ENCOUNTER — Ambulatory Visit: Payer: Medicaid Other

## 2021-07-13 DIAGNOSIS — R2681 Unsteadiness on feet: Secondary | ICD-10-CM | POA: Insufficient documentation

## 2021-07-13 DIAGNOSIS — M6281 Muscle weakness (generalized): Secondary | ICD-10-CM | POA: Insufficient documentation

## 2021-07-13 DIAGNOSIS — G8111 Spastic hemiplegia affecting right dominant side: Secondary | ICD-10-CM | POA: Diagnosis present

## 2021-07-13 DIAGNOSIS — R2689 Other abnormalities of gait and mobility: Secondary | ICD-10-CM | POA: Insufficient documentation

## 2021-07-14 ENCOUNTER — Ambulatory Visit
Admission: RE | Admit: 2021-07-14 | Discharge: 2021-07-14 | Disposition: A | Discharge status: Home / Self Care | Payer: Medicaid Other | Source: Ambulatory Visit | Attending: Internal Medicine | Admitting: Internal Medicine

## 2021-07-14 ENCOUNTER — Encounter: Payer: Self-pay | Admitting: Physical Therapy

## 2021-07-14 ENCOUNTER — Ambulatory Visit: Payer: Medicaid Other | Attending: Internal Medicine | Admitting: Internal Medicine

## 2021-07-14 ENCOUNTER — Encounter: Payer: Self-pay | Admitting: Internal Medicine

## 2021-07-14 VITALS — BP 164/98 | HR 80 | Ht 66.0 in | Wt 226.0 lb

## 2021-07-14 DIAGNOSIS — K219 Gastro-esophageal reflux disease without esophagitis: Secondary | ICD-10-CM | POA: Diagnosis not present

## 2021-07-14 DIAGNOSIS — R051 Acute cough: Secondary | ICD-10-CM | POA: Diagnosis not present

## 2021-07-14 DIAGNOSIS — E1159 Type 2 diabetes mellitus with other circulatory complications: Secondary | ICD-10-CM | POA: Diagnosis not present

## 2021-07-14 DIAGNOSIS — E1122 Type 2 diabetes mellitus with diabetic chronic kidney disease: Secondary | ICD-10-CM | POA: Diagnosis not present

## 2021-07-14 DIAGNOSIS — N184 Chronic kidney disease, stage 4 (severe): Secondary | ICD-10-CM

## 2021-07-14 DIAGNOSIS — E1142 Type 2 diabetes mellitus with diabetic polyneuropathy: Secondary | ICD-10-CM

## 2021-07-14 DIAGNOSIS — I152 Hypertension secondary to endocrine disorders: Secondary | ICD-10-CM

## 2021-07-14 MED ORDER — VALSARTAN 40 MG PO TABS: 40.0000 mg | ORAL_TABLET | Freq: Every day | ORAL | 3 refills | Status: DC

## 2021-07-14 MED ORDER — FAMOTIDINE 20 MG PO TABS: 20.0000 mg | ORAL_TABLET | Freq: Every day | ORAL | 3 refills | Status: DC

## 2021-07-14 NOTE — Progress Notes (Signed)
Cough for 2 weeks ?Acid reflux  ?

## 2021-07-14 NOTE — Progress Notes (Signed)
? ? ?Patient ID: [name] [name], male    DOB: 08-22-1985  MRN: [id number] ? ?CC:  chronic ds management ? ? ?Subjective: ?[name] [name] is a 36 y.o. male who presents for chronic ds management.  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 ? ?C/o bad cough x 2-3 wks.   ?Sometimes cough productive of mucous but mostly dry.   ?No fever, sore throat.  Some SOB and more so RT lung.  Endorses wheezing.  Endorses acid reflux but not as often as before, sometimes at nights.  Denies any burning sensation in stomach or chest.  ?No recent sick contacts but [name] [name] said he also had a cough a few wks ago but resolved.  ?Cough worse when he is in his house.  ?Had negative home COVID test 3 wks ago.  He has not had any COVID-19 vaccines and does not plan to get them. ? ?DM: ?Results for orders placed or performed in visit on 07/11/21  ?HgB A1c  ?Result Value Ref Range  ? Hemoglobin A1C    ? HbA1c POC (<> result, manual entry)    ? HbA1c, POC (prediabetic range)    ? HbA1c, POC (controlled diabetic range) 6.8 0.0 - 7.0 %  ?'He has seen clinical pharmacist a few times.  Last seen a few days ago. A1C is at goal ?Taking Jardiance and Lantus 5 units.  Reports BS good.  Has Dexcom reader for me to review.  Readings look good ? ?HTN/CKD 4: Did not take meds as yet for today ?Saw the nephrologist [name] [name] earlier this week.  He stated that changes were made in his medications but he is not sure.  I had my CMA call [facility].  Apparently [name] [name] discontinued lisinopril/HCTZ and placed him on lisinopril 10 mg and chlorthalidone 25 mg daily.  Carvedilol was changed to 3.125 mg twice a day.  However  he had seen our clinical pharmacist 3 days ago and carvedilol was increased from 6.25 mg twice a day to 12.5 mg twice a day. ? ?Patient Active Problem List  ? Diagnosis Date Noted  ? Gastroparesis 04/17/2021  ? Abnormality of gait 10/11/2020  ? Macroalbuminuric diabetic nephropathy ([place]) 08/10/2020  ? Hemiparesis of right dominant side due to non-cerebrovascular etiology ([place]) 08/10/2020  ? Diabetic ulcer of left midfoot associated with diabetes mellitus due to underlying condition, limited to breakdown of skin ([place]) 08/10/2020  ? Cellulitis, leg 03/17/2020  ? Cellulitis of right leg 03/17/2020  ? Uncontrolled type 2 diabetes mellitus with complication, with long-term current use of insulin   ? Abscess   ? Cellulitis 12/05/2019  ? Cellulitis of left lower extremity   ? Abscess of left lower extremity   ? Uncontrolled type 2 diabetes mellitus with hyperglycemia ([place])   ? 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 2 diabetes mellitus ([place])   ? Cellulitis of foot, left 11/22/2016  ? Diabetic neuropathy ([place]) 11/22/2016  ? Fracture of 2nd metatarsal 11/22/2016  ? Fracture of 3rd metatarsal 11/22/2016  ? Postprocedural non-healing wound 11/22/2016  ? Acute cystitis with positive culture   ? Cutaneous abscess of left foot   ? Poorly controlled type 2 diabetes mellitus with complication ([place]) 67/67/2094  ?  Hyperlipidemia 10/17/2016  ? Depression 10/17/2016  ? Neck pain   ? Asthma 04/11/2016  ? Uncontrolled type 2 diabetes mellitus with complication   ? Obesity, morbid ([place]) 09/06/2015  ? Hypertension associated with diabetes ([place]) 09/06/2015  ?  ? ?Current Outpatient Medications on File Prior to Visit  ?Medication Sig Dispense Refill  ? acetaminophen (TYLENOL) 325 MG tablet Take 2 tablets (650 mg total) by mouth every 6 (six) hours as needed for mild pain or fever (for pain). 10 tablet 0  ? amLODipine (NORVASC) 10 MG tablet Take 1 tablet (10 mg total) by mouth daily. 30 tablet 6  ? Blood Glucose Monitoring Suppl (TRUE METRIX METER) w/Device  KIT 1 each by Does not apply route in the morning and at bedtime. 1 kit 0  ? carvedilol (COREG) 12.5 MG tablet Take 1 tablet (12.5 mg total) by mouth 2 (two) times daily with a meal. 180 tablet 1  ? Continuous Blood Gluc Receiver (DEXCOM G6 RECEIVER) DEVI 1 Device by Does not apply route daily. 1 each 0  ? Continuous Blood Gluc Sensor (DEXCOM G6 SENSOR) MISC 1 packet by Does not apply route daily. 3 each 11  ? Continuous Blood Gluc Transmit (DEXCOM G6 TRANSMITTER) MISC USE AS DIRECTED DAILY 1 each 0  ? empagliflozin (JARDIANCE) 10 MG TABS tablet 1 tab PO Q Mon/Wed/Frid.  Stop Metformin. 12 tablet 2  ? gabapentin (NEURONTIN) 300 MG capsule Take 1 capsule (300 mg total) by mouth 2 (two) times daily. 180 capsule 1  ? glucose blood (TRUE METRIX BLOOD GLUCOSE TEST) test strip Use as instructed 100 each 12  ? insulin glargine (LANTUS SOLOSTAR) 100 UNIT/ML Solostar Pen Inject 5 Units into the skin at bedtime. 15 mL 11  ? Insulin Pen Needle (PEN NEEDLES) 31G X 8 MM MISC UAD 100 each 6  ? metoCLOPramide (REGLAN) 5 MG tablet Twice a day 30 mins prior to lunch and dinner 60 tablet 5  ? ondansetron (ZOFRAN) 4 MG tablet Take 1 tablet (4 mg total) by mouth 2 (two) times daily as needed for nausea or vomiting. 30 tablet 2  ? pravastatin (PRAVACHOL) 20 MG tablet TAKE 1 TABLET (20 MG TOTAL) BY MOUTH DAILY. (BEDTIME) 90 tablet 1  ? chlorthalidone (HYGROTON) 25 MG tablet Take 12.5 mg by mouth daily.    ? ?No current facility-administered medications on file prior to visit.  ? ? ?No Known Allergies ? ?Social History  ? ?Socioeconomic History  ? Marital status: Single  ?  Spouse name: Not on file  ? Number of children: Not on file  ? Years of education: Not on file  ? Highest education level: Not on file  ?Occupational History  ? Not on file  ?Tobacco Use  ? Smoking status: Never  ? Smokeless tobacco: Never  ?Vaping Use  ? Vaping Use: Never used  ?Substance and Sexual Activity  ? Alcohol use: Yes  ?  Comment: 11/06/2016 "I'll drink a  little at parties; maybe 5 times/year"  ? Drug use: Yes  ?  Types: Marijuana  ?  Comment: 11/06/2016 "stopped ~ 1 month ago"  ? Sexual activity: Never  ?  Birth control/protection: Diaphragm  ?Other Topics Concern  ? Not on file  ?Social History Narrative  ? Not on file  ? ?Social Determinants of Health  ? ?Financial Resource Strain: Not on file  ?Food Insecurity: Not on file  ?Transportation Needs: Not on file  ?Physical Activity: Not on file  ?Stress: Not on file  ?Social  Connections: Not on file  ?Intimate Partner Violence: Not on file  ? ? ?Family History  ?Problem Relation Age of Onset  ? Diabetes type II Mother   ? Coronary artery disease Mother   ? Diabetes type II Father   ? ? ?Past Surgical History:  ?Procedure Laterality Date  ? AMPUTATION Left 11/05/2016  ? Procedure: AMPUTATION LEFT GREAT TOE;  Surgeon: Newt Minion, MD;  Location: [facility];  Service: Orthopedics;  Laterality: Left;  ? CATARACT EXTRACTION Left 07/2020  ? DRESSING CHANGE UNDER ANESTHESIA N/A 04/13/2016  ? Procedure: DRESSING CHANGE UNDER ANESTHESIA;  Surgeon: Johnathan Hausen, MD;  [facility];  Service: General;  Laterality: N/A;  ? INCISION AND DRAINAGE ABSCESS Right 09/03/2015  ? Procedure: INCISION AND DRAINAGE ABSCESS;  Surgeon: Roseanne Kaufman, MD;  [facility];  Service: Orthopedics;  Laterality: Right;  ? INCISION AND DRAINAGE PERIRECTAL ABSCESS N/A 04/11/2016  ? Procedure: IRRIGATION AND DEBRIDEMENT RECTAL ABSCESS;  Surgeon: Johnathan Hausen, MD;  [facility];  Service: General;  Laterality: N/A;  ? [place]; 1989  ? x2   ? IRRIGATION AND DEBRIDEMENT ABSCESS Left 08/31/2015  ? Procedure: IRRIGATION AND DEBRIDEMENT ABSCESS LEFT SHOULDER;  Surgeon: Alphonsa Overall, MD;  [facility];  Service: General;  Laterality: Left;  ? WOUND DEBRIDEMENT N/A 02/24/2016  ? Procedure: SACRAL WOUND DEBRIDEMENT;  Surgeon: Georganna Skeans, MD;  Location: [facility];  Service: General;  Laterality: N/A;  ? ? ?ROS: ?Review of  Systems ?Negative except as stated above ? ?PHYSICAL EXAM: ?BP (!) 164/98   Pulse 80   Ht _0  (1.676 m)   Wt 226 lb (102.5 kg)   SpO2 99%   BMI 36.48 kg/m?   ?Physical Exam ? ? ?General appearance -patient is a

## 2021-07-14 NOTE — Therapy (Signed)
[facility] ?[place], [place], [address] ?Phone: [phone]   Fax:  [phone] ? ?Physical Therapy Evaluation ? ?Patient Details  ?Name: [name] [name] ?MRN: [id number] ?Date of Birth: September 29, 1985 ?Referring Provider (PT): Karle Plumber, MD ? ? ?Encounter Date: 07/13/2021 ? ? PT End of Session - 07/14/21 1408   ? ? Visit Number 1   ? Number of Visits 1   ? Authorization Type [employer]   ? PT Start Time 1315   ? PT Stop Time 1420   ? PT Time Calculation (min) 65 min   ? Activity Tolerance Patient tolerated treatment well   ? Behavior During Therapy [facility] for tasks assessed/performed   ? ?  ?  ? ?  ? ? ?Past Medical History:  ?Diagnosis Date  ? Arthritis   ? "neck; maybe in my fingers" (11/06/2016)  ? Asthma   ? Bipolar disorder ([place])   ? "IED: intentional deficit disorder"  ? Depression   ? Heart murmur   ? "when I was a child"  ? Hypertension   ? Type II diabetes mellitus ([place])   ? ? ?Past Surgical History:  ?Procedure Laterality Date  ? AMPUTATION Left 11/05/2016  ? Procedure: AMPUTATION LEFT GREAT TOE;  Surgeon: Newt Minion, MD;  Location: [facility];  Service: Orthopedics;  Laterality: Left;  ? CATARACT EXTRACTION Left 07/2020  ? DRESSING CHANGE UNDER ANESTHESIA N/A 04/13/2016  ? Procedure: DRESSING CHANGE UNDER ANESTHESIA;  Surgeon: Johnathan Hausen, MD;  [facility];  Service: General;  Laterality: N/A;  ? INCISION AND DRAINAGE ABSCESS Right 09/03/2015  ? Procedure: INCISION AND DRAINAGE ABSCESS;  Surgeon: Roseanne Kaufman, MD;  [facility];  Service: Orthopedics;  Laterality: Right;  ? INCISION AND DRAINAGE PERIRECTAL ABSCESS N/A 04/11/2016  ? Procedure: IRRIGATION AND DEBRIDEMENT RECTAL ABSCESS;  Surgeon: Johnathan Hausen, MD;  [facility];  Service: General;  Laterality: N/A;  ? [place]; 1989  ? x2   ? IRRIGATION AND DEBRIDEMENT ABSCESS Left 08/31/2015  ? Procedure: IRRIGATION AND DEBRIDEMENT ABSCESS LEFT  SHOULDER;  Surgeon: Alphonsa Overall, MD;  [facility];  Service: General;  Laterality: Left;  ? WOUND DEBRIDEMENT N/A 02/24/2016  ? Procedure: SACRAL WOUND DEBRIDEMENT;  Surgeon: Georganna Skeans, MD;  Location: [facility];  Service: General;  Laterality: N/A;  ? ? ?There were no vitals filed for this visit. ? ? ? Subjective Assessment - 07/14/21 1358   ? ? Subjective Patient presents for power wheelchair eval - Erlene Quan Sisk, [employer] with [employer] present for eval; pt has Rt hemiparesis due to s/p cervical spinal tumor excision in 2020.  Pt is wearing AFO on RLE but is not using an assistive device;  pt reports he used to have power wheelchair when he lived in [place]  - broke in 2021 and he has been unable to get it fixed in [place]   ? 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 Limitations Walking;Standing   ? Patient Stated Goals obtain new power wheelchair   ? Currently in Pain? No/denies   ? ?  ?  ? ?  ? ? ? ? ? [facility] PT Assessment - 07/14/21 0001   ? ?  ? Assessment  ? Medical Diagnosis Hemiparesis of  R dominant side   ? Referring Provider (PT) Karle Plumber, MD   ? Onset Date/Surgical Date --   2020  ? Hand Dominance Right   now using LUE as dominant  ? Prior Therapy HH and rehab after tumor removal   ?  ? Precautions  ? Precautions Fall   ? Required Braces or Orthoses Other Brace/Splint   ? Other Brace/Splint AFO   ?  ? Balance Screen  ? Has the patient fallen in the past 6 months No   ? Has the patient had a decrease in activity level because of a fear of falling?  No   ? Is the patient reluctant to leave their home because of a fear of falling?  No   ?  ? Prior Function  ? Level of Independence Independent with basic ADLs   ? ?  ?  ? ?  ? ? ? ? ? ? ? ? ? ? ? ? ? ?Objective measurements  completed on examination: See above findings.  ? ? ? ? ? ?LMN for power wheelchair (Group 2) to be completed when quote has been received from vendor, Deberah Pelton, ATP, with [employer] ? ? ? ? ? ? ? ? ? ? PT Short Term Goals - 09/08/20 1053   ? ?  ? PT SHORT TERM GOAL #1  ? Title Pt will be independent with initial HEP in order to build upon functional gains made in therapy. ALL STGS DUE 10/06/20   ? Baseline currently dependent   ? Time 4   ? Period Weeks   ? Status New   ? Target Date 10/06/20   ?  ? PT SHORT TERM GOAL #2  ? Title Pt will undergo further assessment of BERG with LTG to be written as appropriate to decr fall risk.   ? Baseline not yet assessed.   ? Time 4   ? Period Weeks   ? Status New   ?  ? PT SHORT TERM GOAL #3  ? Title Pt will decr TUG time with no AD to 15 seconds or less in order to demo decr fall risk.   ? Baseline 17.13 seconds   ? Time 4   ? Period Weeks   ? Status New   ?  ? PT SHORT TERM GOAL #4  ? Title Pt will ambulate at least 57' with no AD and R AFO with supervision over level surfaces in order to demo improved household mobility.   ? Baseline supervision/min guard with no AD and no AFO   ? Time 4   ? Period Weeks   ? Status New   ?  ? PT SHORT TERM GOAL #5  ? Title Pt will improve gait speed to at least 2.4 ft/sec with no AD vs. LRAD and R AFO in order to demo improved community mobility.   ? Baseline 2.11 ft/sec   ? Time 4   ? Period Weeks   ? Status New   ? ?  ?  ? ?  ? ? ? ? PT Long Term Goals - 09/08/20 1058   ? ?  ? PT LONG TERM GOAL #1  ? Title Pt will be independent with final HEP in order to build upon functional gains made in therapy. ALL LTGS DUE 11/10/20   ? Baseline currently dependent   ? Time 9   ? Period Weeks   ? Status New   ? Target Date 11/10/20   ?  ? PT  LONG TERM GOAL #2  ? Title BERG goal to be written as appropriate in order to demo decr fall risk.   ? Baseline not yet assessed.   ? Time 9   ? Period Weeks   ? Status New   ?  ? PT LONG TERM GOAL #3  ? Title Pt  will ambulate at least 345' with no AD vs. LRAD and R AFO with supervision over unlevel paved surfaces in order to demo improved community mobility.   ? Baseline not yet assessed outdoors.   ? Time 9   ? Period Weeks   ? Status New   ?  ? PT LONG TERM GOAL #4  ? Title Pt will perform 8 steps with single handrail with step to pattern with supervision in order to safely get in and out of the house.   ? Baseline 4 steps with heavily reliant on handrail and min guard.   ? Time 9   ? Period Weeks   ? Status New   ?  ? PT LONG TERM GOAL #5  ? Title Pt will improve gait speed to at least 2.7 ft/sec with no AD vs. LRAD and R AFO in order to demo improved community mobility.   ? Baseline 2.11 ft/sec   ? Time 9   ? Period Weeks   ? Status New   ? ?  ?  ? ?  ? ? ? ? ? ? ? ? ? Plan - 07/14/21 1408   ? ? Clinical Impression Statement Pt presents for evaluation for power wheelchair, primarily for use in the community for increased community accessibility.  Pt is currently amb. modified independently without device with use of AFO on RLE.  Pt has Rt hemiparesis s/p removal of benign cervical spinal tumor in 2020 in [place].  Pt reports he used to have power wheelchair but states it broke in late 2021.  PMH also includes DM type II, retinopathy, nephropathy, peripheral neuropathy, s/p amputation of Lt great toe and 2nd toe, HTN, CKD, and bipolar disorder.  Pt's TUG score = 25.47  secs, indicative of fall risk.  Pt would benefit from use of power wheelchair for independence and safety with mobility with prolonged distances.  Eval was completed with vendor Deberah Pelton, [employer] with [employer].   ? Personal Factors and Comorbidities Comorbidity 3+;Past/Current Experience;Time since onset of injury/illness/exacerbation;Behavior Pattern   ? 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 Examination-Activity Limitations Transfers;Bathing;Squat;Stairs;Locomotion Level   ? Examination-Participation Restrictions Cleaning;Communi

## 2021-07-14 NOTE — Patient Instructions (Signed)
STOP LISINOPRIL.  START DIOVAN INSTEAD. ?TAKE PEPCID for acid reflux. ? ?

## 2021-07-15 LAB — NOVEL CORONAVIRUS, NAA: SARS-CoV-2, NAA: NOT DETECTED

## 2021-07-19 ENCOUNTER — Telehealth: Payer: Self-pay | Admitting: Internal Medicine

## 2021-07-19 MED ORDER — CARVEDILOL 3.125 MG PO TABS
12.5000 mg | ORAL_TABLET | Freq: Two times a day (BID) | ORAL | 2 refills | Status: DC
Start: 2021-07-19 — End: 2022-02-22

## 2021-07-19 NOTE — Telephone Encounter (Signed)
I spoke with the pharmacist at [facility] today regarding patient's medications.  He told me that they did receive my prescription for the valsartan and will be taking the lisinopril out of this blister pack. ?I also asked about the carvedilol.  He tells me they received prescription from the nephrologist requesting dose be lowered from the 6.25 mg to 3.125 mg BID.  I will make this change on pt's med list.  ?

## 2021-08-10 ENCOUNTER — Other Ambulatory Visit: Payer: Self-pay | Admitting: Internal Medicine

## 2021-08-15 ENCOUNTER — Ambulatory Visit: Payer: Medicaid Other | Attending: Internal Medicine | Admitting: Pharmacist

## 2021-08-15 ENCOUNTER — Other Ambulatory Visit: Payer: Self-pay

## 2021-08-15 ENCOUNTER — Encounter: Payer: Self-pay | Admitting: Pharmacist

## 2021-08-15 VITALS — BP 158/94 | HR 81

## 2021-08-15 DIAGNOSIS — E1159 Type 2 diabetes mellitus with other circulatory complications: Secondary | ICD-10-CM

## 2021-08-15 DIAGNOSIS — I152 Hypertension secondary to endocrine disorders: Secondary | ICD-10-CM

## 2021-08-15 MED ORDER — AMLODIPINE BESYLATE 10 MG PO TABS
10.0000 mg | ORAL_TABLET | Freq: Every day | ORAL | 1 refills | Status: DC
Start: 2021-08-15 — End: 2022-01-18
  Filled 2021-08-15: qty 90, 90d supply, fill #0

## 2021-08-15 NOTE — Progress Notes (Signed)
S:    PCP: Dr. Wynetta Emery  Patient arrives in good spirits. Presents to the clinic for hypertension evaluation, counseling, and management.  Patient was referred and last seen by Primary Care Provider on 07/14/21. BP at that visit was 164/98. Dr. Wynetta Emery changed his lisinopril to valsartan 40 mg daily.    Medication adherence reported. Pt tells me he took his medications today.  I called Rogers Blocker at [facility]. He tells me that pt has amlodipine 5 mg daily in his pill packs that were last shipped. Additionally, pt was sent lisinopril 10 mg daily, not valsartan. Finally, he mentions that carvedilol 3.125 mg BID is the carvedilol dose that was sent in his last pill pack. Chlorthalidone 12.5 mg daily was mailed correctly.   Current BP Medications include:  amlodipine 10 mg daily (taking 5 mg daily), carvedilol 12.5 mg BID (taking 3.125 mg BID), chlorthalidone 12.5 mg daily (taking), valsartan 40 mg daily (not taking - still taking lisinopril 10 mg daily)  Medication adherence is suboptimal. See above.    Dietary habits include: not fully compliant with salt restriction. He does not add salt at that table but eats frozen meals.  No fast food or take-out per patient. Denies excessive caffeine.  Exercise habits include: none d/t impaired gait Family / Social history:  Fhx:DM Tobacco: never smoker  Alcohol: very rarely    O:  Vitals:   08/15/21 1506  BP: (!) 158/94  Pulse: 81    Home BP readings: 140s-190s/80s-90s. Believes his meter is faulty.   Last 3 Office BP readings: BP Readings from Last 3 Encounters:  08/15/21 (!) 158/94  07/14/21 (!) 164/98  07/11/21 (!) 154/81    BMET    Component Value Date/Time   NA 143 04/17/2021 1529   K 5.0 04/17/2021 1529   CL 108 (H) 04/17/2021 1529   CO2 23 04/17/2021 1529   GLUCOSE 129 (H) 04/17/2021 1529   GLUCOSE 323 (H) 07/19/2020 1413   BUN 37 (H) 04/17/2021 1529   CREATININE 2.71 (H) 04/17/2021 1529   CREATININE 0.73  04/10/2016 1125   CALCIUM 9.3 04/17/2021 1529   GFRNONAA >60 07/19/2020 1413   GFRNONAA >89 04/10/2016 1125   GFRAA 91 01/27/2020 1209   GFRAA >89 04/10/2016 1125    Renal function: CrCl cannot be calculated (Patient's most recent lab result is older than the maximum 21 days allowed.).  Clinical ASCVD: No  The ASCVD Risk score (Arnett DK, et al., 2019) failed to calculate for the following reasons:   The 2019 ASCVD risk score is only valid for ages 94 to 85   A/P: Hypertension longstanding currently above goal on current medications. BP Goal = < 130/80 mmHg. Medication adherence is suboptimal.   I called Christy Sartorius today at [facility] and had him cancel the lisinopril and fill valsartan instead. Additionally, I had him cancel amlodipine 5 mg and fill the 10 mg dose instead. I confirmed the chlorthalidone 12.5 mg daily dose. I asked him to continue filling carvedilol at the 3.125 mg BID dose since we are technically inc the amlodipine and changing to valsartan. He expressed understanding and will send the delivery next week.   -Stop amlodipine 5 mg. Start amlodipine 10 mg daily.  -Stop lisinopril. Start valsartan 40 mg daily.  -Continue carvedilol 3.125 mg BID dose for now.  -Continue chlorthalidone 12.5 mg daily, per Nephrology.  -Counseled on lifestyle modifications for blood pressure control including reduced dietary sodium, increased exercise, adequate sleep.  Results reviewed and written  information provided.   Total time in face-to-face counseling 30 minutes.   F/U Clinic Visit with me in 1 month.  Benard Halsted, PharmD, [facility] [place], [place] [phone]

## 2021-08-17 ENCOUNTER — Telehealth: Payer: Self-pay

## 2021-08-17 NOTE — Telephone Encounter (Signed)
I called [employer] # [phone], spoke to Ivin Booty who confirmed that all they need is for Dr Wynetta Emery to sign the documents that were faxed to the office to complete the order for the power mobility chair.  They do not need any provider notes.

## 2021-08-28 ENCOUNTER — Other Ambulatory Visit: Payer: Self-pay

## 2021-08-28 ENCOUNTER — Telehealth: Payer: Self-pay | Admitting: Internal Medicine

## 2021-08-28 NOTE — Telephone Encounter (Signed)
Copied from [place] [phone]. Topic: General - Call Back - No Documentation >> Aug 25, 2021  2:21 PM Sabas Sous wrote: Reason for CRM: Sharyn Lull calling from [place] called to check status of Physician order for BP Monitor on the wrist. 05/30. Requesting a call back, please advise   Best contact: [phone] ext. [phone]

## 2021-09-05 ENCOUNTER — Telehealth: Payer: Self-pay | Admitting: Internal Medicine

## 2021-09-05 ENCOUNTER — Other Ambulatory Visit: Payer: Self-pay

## 2021-09-05 NOTE — Telephone Encounter (Signed)
Call placed to [facility] and she states that patient received  BP machine on 08/25/2021

## 2021-09-05 NOTE — Telephone Encounter (Signed)
Copied from [place] [phone]. Topic: Referral - Request for Referral >> Sep 05, 2021  2:26 PM Everette C wrote: Has patient seen PCP for this complaint? Yes.   *If NO, is insurance requiring patient see PCP for this issue before PCP can refer them? Referral for which specialty: Nephrology  Preferred provider/office: Patient has no preference but needs them to accept [facility]  Reason for referral: kidney concerns

## 2021-09-12 ENCOUNTER — Telehealth: Payer: Self-pay

## 2021-09-12 DIAGNOSIS — G8191 Hemiplegia, unspecified affecting right dominant side: Secondary | ICD-10-CM

## 2021-09-13 NOTE — Telephone Encounter (Signed)
Noted  

## 2021-09-22 ENCOUNTER — Ambulatory Visit: Payer: Medicaid Other | Attending: Internal Medicine | Admitting: Pharmacist

## 2021-09-22 VITALS — BP 124/70 | HR 71

## 2021-09-22 DIAGNOSIS — E1159 Type 2 diabetes mellitus with other circulatory complications: Secondary | ICD-10-CM

## 2021-09-22 DIAGNOSIS — I152 Hypertension secondary to endocrine disorders: Secondary | ICD-10-CM

## 2021-09-22 NOTE — Progress Notes (Signed)
   S:    PCP: Dr. Wynetta Emery  Patient arrives in good spirits. Presents to the clinic for hypertension evaluation, counseling, and management. Patient was referred and last seen by Primary Care Provider on 07/14/21. BP at that visit was 164/98. I saw him in May and corrected his pill packs at [place].   Medication adherence reported. Pt tells me he took his medications today. He received his updated pill packs last month.   Current BP Medications include:  amlodipine 10 mg daily, carvedilol 12.5 mg BID, chlorthalidone 12.5 mg daily, valsartan 40 mg daily  Medication adherence is suboptimal. See above.    Dietary habits include: not fully compliant with salt restriction. He does not add salt at that table but eats frozen meals.  No fast food or take-out per patient. Denies excessive caffeine.  Exercise habits include: none d/t impaired gait Family / Social history:  Fhx:DM Tobacco: never smoker  Alcohol: very rarely    O:  Vitals:   09/22/21 1406  BP: 124/70  Pulse: 71    Home BP readings: 110s-140s/60s-80s. 1 outlier of 174/94 this AM shortly after taking medications.   Last 3 Office BP readings: BP Readings from Last 3 Encounters:  09/22/21 124/70  08/15/21 (!) 158/94  07/14/21 (!) 164/98    BMET    Component Value Date/Time   NA 143 04/17/2021 1529   K 5.0 04/17/2021 1529   CL 108 (H) 04/17/2021 1529   CO2 23 04/17/2021 1529   GLUCOSE 129 (H) 04/17/2021 1529   GLUCOSE 323 (H) 07/19/2020 1413   BUN 37 (H) 04/17/2021 1529   CREATININE 2.71 (H) 04/17/2021 1529   CREATININE 0.73 04/10/2016 1125   CALCIUM 9.3 04/17/2021 1529   GFRNONAA >60 07/19/2020 1413   GFRNONAA >89 04/10/2016 1125   GFRAA 91 01/27/2020 1209   GFRAA >89 04/10/2016 1125    Renal function: CrCl cannot be calculated (Patient's most recent lab result is older than the maximum 21 days allowed.).  Clinical ASCVD: No  The ASCVD Risk score (Arnett DK, et al., 2019) failed to calculate for the  following reasons:   The 2019 ASCVD risk score is only valid for ages 73 to 48   A/P: Hypertension longstanding currently at goal on current medications. BP Goal = < 130/80 mmHg. Medication adherence is now optimal.  -Continue current regimen. -Counseled on lifestyle modifications for blood pressure control including reduced dietary sodium, increased exercise, adequate sleep.  Results reviewed and written information provided.   Total time in face-to-face counseling 30 minutes.   F/U Clinic Visit with Dr. Wynetta Emery next month.  [name] [name], PharmD, [name] [name], [place] [phone]

## 2021-10-11 NOTE — Telephone Encounter (Signed)
Pt needs shower chair and Angie would like a call to confirm how chair will be order so she can know how to proceed / she asked if it would be ordered thru [employer] / please advise asap/ cb# [phone]

## 2021-10-12 NOTE — Telephone Encounter (Signed)
Order will be faxed to [facility] once PCP signs off.

## 2021-10-12 NOTE — Addendum Note (Signed)
Addended by: Carilyn Goodpasture on: 10/12/2021 02:21 PM   Modules accepted: Orders

## 2021-10-16 NOTE — Telephone Encounter (Signed)
Angie called back to see if Shower chair order was faxed to [facility]/ please advise

## 2021-10-18 NOTE — Telephone Encounter (Signed)
Caller checking on the status of shower chair.

## 2021-10-19 NOTE — Telephone Encounter (Signed)
/  Order was originally sent on / order has been re faxed to [facility].

## 2021-10-20 ENCOUNTER — Telehealth: Payer: Self-pay

## 2021-10-20 NOTE — Telephone Encounter (Signed)
Reached out to patient to schedule a telephone visit to discuss the need for the shower chair. [employer] sent order back stating office notes are needed.  VM was left informing patient to return phone call to schedule appointment.

## 2021-10-23 ENCOUNTER — Encounter: Payer: Self-pay | Admitting: Internal Medicine

## 2021-10-23 ENCOUNTER — Ambulatory Visit: Payer: Medicaid Other | Attending: Internal Medicine | Admitting: Internal Medicine

## 2021-10-23 VITALS — BP 141/90 | HR 75 | Temp 98.1°F | Ht 67.0 in | Wt 231.2 lb

## 2021-10-23 DIAGNOSIS — I152 Hypertension secondary to endocrine disorders: Secondary | ICD-10-CM

## 2021-10-23 DIAGNOSIS — E785 Hyperlipidemia, unspecified: Secondary | ICD-10-CM

## 2021-10-23 DIAGNOSIS — E1159 Type 2 diabetes mellitus with other circulatory complications: Secondary | ICD-10-CM

## 2021-10-23 DIAGNOSIS — E1169 Type 2 diabetes mellitus with other specified complication: Secondary | ICD-10-CM | POA: Diagnosis not present

## 2021-10-23 DIAGNOSIS — E1122 Type 2 diabetes mellitus with diabetic chronic kidney disease: Secondary | ICD-10-CM | POA: Diagnosis not present

## 2021-10-23 DIAGNOSIS — E1142 Type 2 diabetes mellitus with diabetic polyneuropathy: Secondary | ICD-10-CM | POA: Diagnosis not present

## 2021-10-23 DIAGNOSIS — Z89429 Acquired absence of other toe(s), unspecified side: Secondary | ICD-10-CM

## 2021-10-23 DIAGNOSIS — G8191 Hemiplegia, unspecified affecting right dominant side: Secondary | ICD-10-CM

## 2021-10-23 DIAGNOSIS — N184 Chronic kidney disease, stage 4 (severe): Secondary | ICD-10-CM

## 2021-10-23 LAB — POCT GLYCOSYLATED HEMOGLOBIN (HGB A1C): HbA1c, POC (controlled diabetic range): 6.8 % (ref 0.0–7.0)

## 2021-10-23 LAB — GLUCOSE, POCT (MANUAL RESULT ENTRY): POC Glucose: 158 mg/dl — AB (ref 70–99)

## 2021-10-23 NOTE — Telephone Encounter (Signed)
Angie calling from [facility] is calling to recive a copy of the order. Cb- [phone] Fax- [email or web address]

## 2021-10-23 NOTE — Progress Notes (Signed)
Patient ID: [name] [name], male    DOB: 1985-06-20  MRN: [id number]  CC: Diabetes   Subjective: [name] [name] is a 36 y.o. male who presents for chronic ds management.  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  DM: Results for orders placed or performed in visit on 10/23/21  POCT glucose (manual entry)  Result Value Ref Range   POC Glucose 158 (A) 70 - 99 mg/dl  POCT glycosylated hemoglobin (Hb A1C)  Result Value Ref Range   Hemoglobin A1C     HbA1c POC (<> result, manual entry)     HbA1c, POC (prediabetic range)     HbA1c, POC (controlled diabetic range) 6.8 0.0 - 7.0 %  BS doing good, sometimes high.  Looks like for the most part BS below 100 Staying away from sugar drinks.  Some fruits and veggies Takes 8 units of Lantus only if BS increase about 200.  In past mth took only 3-4 times.  On Jardiance 10 mg daily and taking consistently Request prescription for diabetic shoes.  Also request prescription for shower chair due to his right-sided hemiparesis.  Finds it safer to sit in the shower so that he does not slip and loses balance.  HTN/CKD 4:  taking meds sent to him from [place] in bubble packs.  Took meds already today. Checks BP 3-4x/day using wrist device.  High at times.  On last visit with clinical pharmacist Limits salt in foods Saw kidney specialist last mth. Referred to [facility] clinic.  Has appt later this mth  HL:  takes Pravachol and tolerating the medication  HM:  Last eye exam was last mth.  Seen 2 x a mth at the [facility] for inj to eyes  Patient Active Problem List   Diagnosis Date Noted   Gastroparesis  04/17/2021   Abnormality of gait 10/11/2020   Macroalbuminuric diabetic nephropathy ([place]) 08/10/2020   Hemiparesis of right dominant side due to non-cerebrovascular etiology ([place]) 08/10/2020   Diabetic ulcer of left midfoot associated with diabetes mellitus due to underlying condition, limited to breakdown of skin ([place]) 08/10/2020   Cellulitis, leg 03/17/2020   Cellulitis of right leg 03/17/2020   Uncontrolled type 2 diabetes mellitus with complication, with long-term current use of insulin    Abscess    Cellulitis 12/05/2019   Cellulitis of left lower extremity    Abscess of left lower extremity    Uncontrolled type 2 diabetes mellitus with hyperglycemia (HCC)    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 2 diabetes mellitus ([place])    Cellulitis of foot, left 11/22/2016   Diabetic neuropathy ([place]) 11/22/2016   Fracture of 2nd metatarsal 11/22/2016   Fracture of 3rd metatarsal 11/22/2016   Postprocedural non-healing wound 11/22/2016   Acute cystitis with positive culture    Cutaneous abscess of left foot    Poorly controlled type 2 diabetes mellitus with complication ([place]) 66/08/3014   Hyperlipidemia 10/17/2016   Depression 10/17/2016   Neck pain    Asthma 04/11/2016   Uncontrolled type 2 diabetes mellitus with complication    Obesity, morbid ([place]) 09/06/2015   Hypertension associated with diabetes ([place]) 09/06/2015     Current Outpatient Medications on  File Prior to Visit  Medication Sig Dispense Refill   acetaminophen (TYLENOL) 325 MG tablet Take 2 tablets (650 mg total) by mouth every 6 (six) hours as needed for mild pain or fever (for pain). 10 tablet 0   amLODipine (NORVASC) 10 MG tablet Take 1 tablet (10 mg total) by mouth daily. 90 tablet 1   Blood Glucose Monitoring Suppl (TRUE METRIX METER) w/Device KIT 1 each by Does not apply route in the morning and at bedtime. 1 kit 0   carvedilol (COREG) 3.125 MG tablet Take 4 tablets (12.5 mg total) by mouth 2 (two) times daily with a meal. 60 tablet 2    chlorthalidone (HYGROTON) 25 MG tablet Take 12.5 mg by mouth daily.     Continuous Blood Gluc Receiver (DEXCOM G6 RECEIVER) DEVI 1 Device by Does not apply route daily. 1 each 0   Continuous Blood Gluc Sensor (DEXCOM G6 SENSOR) MISC USE DAILY AS DIRECTED 3 each 2   Continuous Blood Gluc Transmit (DEXCOM G6 TRANSMITTER) MISC USE AS DIRECTED DAILY 1 each 0   empagliflozin (JARDIANCE) 10 MG TABS tablet 1 tab PO Q Mon/Wed/Frid.  Stop Metformin. 12 tablet 2   famotidine (PEPCID) 20 MG tablet Take 1 tablet (20 mg total) by mouth at bedtime. 30 tablet 3   gabapentin (NEURONTIN) 300 MG capsule Take 1 capsule (300 mg total) by mouth 2 (two) times daily. 180 capsule 1   glucose blood (TRUE METRIX BLOOD GLUCOSE TEST) test strip Use as instructed 100 each 12   insulin glargine (LANTUS SOLOSTAR) 100 UNIT/ML Solostar Pen Inject 5 Units into the skin at bedtime. 15 mL 11   Insulin Pen Needle (PEN NEEDLES) 31G X 8 MM MISC UAD 100 each 6   metoCLOPramide (REGLAN) 5 MG tablet Twice a day 30 mins prior to lunch and dinner 60 tablet 5   ondansetron (ZOFRAN) 4 MG tablet Take 1 tablet (4 mg total) by mouth 2 (two) times daily as needed for nausea or vomiting. 30 tablet 2   pravastatin (PRAVACHOL) 20 MG tablet TAKE 1 TABLET (20 MG TOTAL) BY MOUTH DAILY. (BEDTIME) 90 tablet 1   valsartan (DIOVAN) 40 MG tablet Take 1 tablet (40 mg total) by mouth daily. Stop Lisinopril due to cough 30 tablet 3   No current facility-administered medications on file prior to visit.    No Known Allergies  Social History   Socioeconomic History   Marital status: Single    Spouse name: Not on file   Number of children: Not on file   Years of education: Not on file   Highest education level: Not on file  Occupational History   Not on file  Tobacco Use   Smoking status: Never   Smokeless tobacco: Never  Vaping Use   Vaping Use: Never used  Substance and Sexual Activity   Alcohol use: Yes    Comment: 11/06/2016 "I'll drink a  little at parties; maybe 5 times/year"   Drug use: Yes    Types: Marijuana    Comment: 11/06/2016 "stopped ~ 1 month ago"   Sexual activity: Never    Birth control/protection: Diaphragm  Other Topics Concern   Not on file  Social History Narrative   Not on file   Social Determinants of Health   Financial Resource Strain: Not on file  Food Insecurity: Not on file  Transportation Needs: Not on file  Physical Activity: Not on file  Stress: Not on file  Social Connections: Not on file  Intimate Partner  Violence: Not on file    Family History  Problem Relation Age of Onset   Diabetes type II Mother    Coronary artery disease Mother    Diabetes type II Father     Past Surgical History:  Procedure Laterality Date   AMPUTATION Left 11/05/2016   Procedure: AMPUTATION LEFT GREAT TOE;  Surgeon: Newt Minion, MD;  Location: [facility];  Service: Orthopedics;  Laterality: Left;   CATARACT EXTRACTION Left 07/2020   DRESSING CHANGE UNDER ANESTHESIA N/A 04/13/2016   Procedure: DRESSING CHANGE UNDER ANESTHESIA;  Surgeon: Johnathan Hausen, MD;  Location: [facility];  Service: General;  Laterality: N/A;   INCISION AND DRAINAGE ABSCESS Right 09/03/2015   Procedure: INCISION AND DRAINAGE ABSCESS;  Surgeon: Roseanne Kaufman, MD;  Location: [facility];  Service: Orthopedics;  Laterality: Right;   INCISION AND DRAINAGE PERIRECTAL ABSCESS N/A 04/11/2016   Procedure: IRRIGATION AND DEBRIDEMENT RECTAL ABSCESS;  Surgeon: Johnathan Hausen, MD;  Location: [facility];  Service: General;  Laterality: N/A;   [name]; 1989   x2    IRRIGATION AND DEBRIDEMENT ABSCESS Left 08/31/2015   Procedure: IRRIGATION AND DEBRIDEMENT ABSCESS LEFT SHOULDER;  Surgeon: Alphonsa Overall, MD;  Location: [facility];  Service: General;  Laterality: Left;   WOUND DEBRIDEMENT N/A 02/24/2016   Procedure: SACRAL WOUND DEBRIDEMENT;  Surgeon: Georganna Skeans, MD;  Location: [facility];  Service: General;  Laterality: N/A;    ROS: Review of  Systems Negative except as stated above  PHYSICAL EXAM: BP (!) 141/90   Pulse 75   Temp 98.1 F (36.7 C) (Oral)   Ht '5\' 7"'  (1.702 m)   Wt 231 lb 3.2 oz (104.9 kg)   SpO2 99%   BMI 36.21 kg/m   Physical Exam Repeat BP 154/91  General appearance - alert, well appearing, and in no distress Mental status - normal mood, behavior, speech, dress, motor activity, and thought processes Neck - supple, no significant adenopathy Chest - clear to auscultation, no wheezes, rales or rhonchi, symmetric air entry Heart - normal rate, regular rhythm, normal S1, S2, no murmurs, rubs, clicks or gallops Extremities - peripheral pulses normal, no pedal edema, no clubbing or cyanosis Diabetic Foot Exam - Simple   Simple Foot Form Diabetic Foot exam was performed with the following findings: Yes 10/23/2021  3:29 PM  Visual Inspection See comments: Yes Sensation Testing See comments: Yes Pulse Check Posterior Tibialis and Dorsalis pulse intact bilaterally: Yes Comments Amputated 1st and 2nd toes LT foot.  Toenails are overgrown.  Toenail on the right second toe is discolored.  Small nonulcerative callus on the ball of the right foot.        Latest Ref Rng & Units 04/17/2021    3:29 PM 03/21/2021    3:36 PM 11/24/2020    1:58 PM  CMP  Glucose 70 - 99 mg/dL 129  143  151   BUN 6 - 20 mg/dL 37  34  35   Creatinine 0.76 - 1.27 mg/dL 2.71  2.48  2.12   Sodium 134 - 144 mmol/L 143  142  141   Potassium 3.5 - 5.2 mmol/L 5.0  3.8  4.6   Chloride 96 - 106 mmol/L 108  106  105   CO2 20 - 29 mmol/L '23  23  22   ' Calcium 8.7 - 10.2 mg/dL 9.3  8.8  9.3   Total Protein 6.0 - 8.5 g/dL 6.5  6.0    Total Bilirubin 0.0 - 1.2 mg/dL <  0.2  <0.2    Alkaline Phos 44 - 121 IU/L 85  91    AST 0 - 40 IU/L 19  13    ALT 0 - 44 IU/L 16  12     Lipid Panel     Component Value Date/Time   CHOL 149 03/21/2021 1536   TRIG 104 03/21/2021 1536   HDL 37 (L) 03/21/2021 1536   CHOLHDL 4.0 03/21/2021 1536   CHOLHDL 4.6  12/06/2019 0749   VLDL 23 12/06/2019 0749   LDLCALC 93 03/21/2021 1536    CBC    Component Value Date/Time   WBC 10.3 03/21/2021 1536   WBC 10.2 07/19/2020 1413   RBC 3.54 (L) 03/21/2021 1536   RBC 4.13 (L) 07/19/2020 1413   HGB 10.5 (L) 03/21/2021 1536   HCT 30.0 (L) 03/21/2021 1536   PLT 308 03/21/2021 1536   MCV 85 03/21/2021 1536   MCH 29.7 03/21/2021 1536   MCH 28.8 07/19/2020 1413   MCHC 35.0 03/21/2021 1536   MCHC 35.1 07/19/2020 1413   RDW 13.0 03/21/2021 1536   LYMPHSABS 1.9 07/19/2020 1413   LYMPHSABS 1.7 01/27/2020 1209   MONOABS 0.5 07/19/2020 1413   EOSABS 0.2 07/19/2020 1413   EOSABS 0.1 01/27/2020 1209   BASOSABS 0.1 07/19/2020 1413   BASOSABS 0.1 01/27/2020 1209    ASSESSMENT AND PLAN: 1. Type 2 diabetes mellitus with peripheral neuropathy (HCC) At goal.  Commended him on this.  He will continue Jardiance.  Keep Lantus to use when he needs to. - POCT glucose (manual entry) - POCT glycosylated hemoglobin (Hb A1C) - For Home Use Only DME Diabetic Shoe  2. Hypertension associated with diabetes ([place]) Not at goal.  He reports compliance with Diovan 40 mg daily, Norvasc 10 mg daily, chlorthalidone 25 mg half a tablet daily and carvedilol 12.5 mg twice a day.  However he does not have his medicine blister packs with him.  We will have him follow-up with the clinical pharmacist in 2 weeks for recheck.  3. CKD stage 4 due to type 2 diabetes mellitus (HCC) GFR has been stable in the 30s.  Continue to monitor.  Avoid NSAIDs.  4. Hyperlipidemia associated with type 2 diabetes mellitus ([place]) Continue Pravachol.  5. Hemiparesis of right dominant side due to non-cerebrovascular etiology [facility]) Prescription given for shower chair - For home use only DME Other see comment  6. History of amputation of toe [facility]) Prescription given for diabetic shoes.     Patient was given the opportunity to ask questions.  Patient verbalized understanding of the plan and was able  to repeat key elements of the plan.   This documentation was completed using Radio producer.  Any transcriptional errors are unintentional.  Orders Placed This Encounter  Procedures   For home use only DME Other see comment   For Home Use Only DME Diabetic Shoe   POCT glucose (manual entry)   POCT glycosylated hemoglobin (Hb A1C)     Requested Prescriptions    No prescriptions requested or ordered in this encounter    Return in about 4 months (around 02/22/2022) for Appt with [facility] in 2 wks for BP check.  Karle Plumber, MD, FACP

## 2021-10-23 NOTE — Progress Notes (Signed)
Pt wants to discuss getting new Dexcom. Pt wants to request G7 Dexcom.  Pt also requesting DM shoes.

## 2021-10-23 NOTE — Telephone Encounter (Signed)
Angie calling back and stated that he will also need a letter stating why he need shower chair. Can be attached to last office note. Will need to send the most recent office note as well. Please advise. Call with any questions

## 2021-10-24 ENCOUNTER — Other Ambulatory Visit: Payer: Self-pay | Admitting: Internal Medicine

## 2021-10-24 DIAGNOSIS — K219 Gastro-esophageal reflux disease without esophagitis: Secondary | ICD-10-CM

## 2021-10-24 DIAGNOSIS — E1159 Type 2 diabetes mellitus with other circulatory complications: Secondary | ICD-10-CM

## 2021-10-24 DIAGNOSIS — E1142 Type 2 diabetes mellitus with diabetic polyneuropathy: Secondary | ICD-10-CM

## 2021-10-24 NOTE — Telephone Encounter (Signed)
Requested Prescriptions  Pending Prescriptions Disp Refills  . valsartan (DIOVAN) 40 MG tablet [Pharmacy Med Name: VALSARTAN 40 MG ORAL TABLET] 90 tablet 1    Sig: TAKE 1 TABLET (40 MG TOTAL) BY MOUTH DAILY. (AM)     Cardiovascular:  Angiotensin Receptor Blockers Failed - 10/24/2021 12:20 PM      Failed - Cr in normal range and within 180 days    Creat  Date Value Ref Range Status  04/10/2016 0.73 0.60 - 1.35 mg/dL Final   Creatinine, Ser  Date Value Ref Range Status  04/17/2021 2.71 (H) 0.76 - 1.27 mg/dL Final         Failed - K in normal range and within 180 days    Potassium  Date Value Ref Range Status  04/17/2021 5.0 3.5 - 5.2 mmol/L Final         Failed - Last BP in normal range    BP Readings from Last 1 Encounters:  10/23/21 (!) 141/90         Passed - Patient is not pregnant      Passed - Valid encounter within last 6 months    Recent Outpatient Visits          Yesterday Type 2 diabetes mellitus with peripheral neuropathy ([place])   [place], Deborah B, MD   1 month ago Hypertension associated with diabetes [facility])   [place], Jarome Matin, RPH-CPP   2 months ago Hypertension associated with diabetes [facility])   [place], Jarome Matin, RPH-CPP   3 months ago Acute cough   [place], MD   3 months ago Type 2 diabetes mellitus with peripheral neuropathy [facility])   [place], Stephen L, RPH-CPP      Future Appointments            In 1 month Daisy Blossom, Jarome Matin, [place]   In 4 months Wynetta Emery, Dalbert Batman, MD [place]           . famotidine (PEPCID) 20 MG tablet [Pharmacy Med Name: FAMOTIDINE 20 MG ORAL TABLET] 90 tablet 3    Sig: TAKE 1 TABLET (20 MG TOTAL) BY MOUTH AT BEDTIME.      Gastroenterology:  H2 Antagonists Passed - 10/24/2021 12:20 PM      Passed - Valid encounter within last 12 months    Recent Outpatient Visits          Yesterday Type 2 diabetes mellitus with peripheral neuropathy [facility])   [place] Ladell Pier, MD   1 month ago Hypertension associated with diabetes [facility])   [place], Jarome Matin, RPH-CPP   2 months ago Hypertension associated with diabetes [facility])   [place], Jarome Matin, RPH-CPP   3 months ago Acute cough   [place], MD   3 months ago Type 2 diabetes mellitus with peripheral neuropathy [facility])   [place], Stephen L, RPH-CPP      Future Appointments            In 1 month Daisy Blossom, Jarome Matin, [place]   In  4 months Ladell Pier, MD [place]           . gabapentin (NEURONTIN) 300 MG capsule [Pharmacy Med Name: GABAPENTIN 300 MG ORAL CAPSULE] 180 capsule 1    Sig: TAKE 1 CAPSULE (300 MG TOTAL) BY MOUTH 2 (TWO) TIMES DAILY. (AM+BEDTIME)     Neurology: Anticonvulsants - gabapentin Failed - 10/24/2021 12:20 PM      Failed - Cr in normal range and within 360 days    Creat  Date Value Ref Range Status  04/10/2016 0.73 0.60 - 1.35 mg/dL Final   Creatinine, Ser  Date Value Ref Range Status  04/17/2021 2.71 (H) 0.76 - 1.27 mg/dL Final         Passed - Completed PHQ-2 or PHQ-9 in the last 360 days      Passed - Valid encounter within last 12 months    Recent Outpatient Visits          Yesterday Type 2 diabetes mellitus with peripheral neuropathy [facility])   [place] Ladell Pier, MD   1 month ago Hypertension associated with diabetes [facility])   [place],  Jarome Matin, RPH-CPP   2 months ago Hypertension associated with diabetes [facility])   [place], Jarome Matin, RPH-CPP   3 months ago Acute cough   [place], MD   3 months ago Type 2 diabetes mellitus with peripheral neuropathy [facility])   [place], Jarome Matin, RPH-CPP      Future Appointments            In 1 month Daisy Blossom, Jarome Matin, [place]   In 4 months Wynetta Emery, Dalbert Batman, MD [place]

## 2021-10-24 NOTE — Telephone Encounter (Signed)
Noted, thanks. ------DD,RMA

## 2021-10-24 NOTE — Telephone Encounter (Signed)
Office notes has been faxed. 

## 2021-11-08 ENCOUNTER — Ambulatory Visit (INDEPENDENT_AMBULATORY_CARE_PROVIDER_SITE_OTHER): Payer: Medicaid Other | Admitting: Cardiovascular Disease

## 2021-11-08 ENCOUNTER — Encounter: Payer: Self-pay | Admitting: Cardiovascular Disease

## 2021-11-08 VITALS — BP 136/78 | HR 79 | Ht 67.0 in | Wt 233.2 lb

## 2021-11-08 DIAGNOSIS — R079 Chest pain, unspecified: Secondary | ICD-10-CM | POA: Insufficient documentation

## 2021-11-08 DIAGNOSIS — E1159 Type 2 diabetes mellitus with other circulatory complications: Secondary | ICD-10-CM | POA: Diagnosis not present

## 2021-11-08 DIAGNOSIS — I152 Hypertension secondary to endocrine disorders: Secondary | ICD-10-CM | POA: Diagnosis not present

## 2021-11-08 NOTE — Progress Notes (Signed)
Cardiology Office Note:    Date:  11/08/2021   ID:  [name] [name], DOB 04/26/1985, MRN [id number]  PCP:  Johnson, Deborah B, MD   [facility] Providers Cardiologist:  Nahser  Click to update primary MD,subspecialty MD or APP then REFRESH:1}    Referring MD: Johnson, Deborah B, MD   Chief Complaint  Patient presents with   Chest Pain     History of Present Illness:    [name] [name] is a 36 y.o. male with a hx of diabetes mellitus, hypertension, chest pain.  Has had DM for 14 years .  Has had poor control over the years.   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   Is on disability  ,  unable to work , unable to exercise   No angina like chest pain but he is not able to exert  himself    Aug. 23, 2023  Seen with [name] [name] ( god brother )  [name] [name] is seen for follow up of his HTN, CM, chest pain  Lexiscan myoview on Feb. 9, 2023 - normal.  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 Peoples ( nephrology )    Past Medical History:  Diagnosis Date   Arthritis    "neck; maybe in my fingers" (11/06/2016)   Asthma    Bipolar disorder (HCC)    "IED: intentional deficit disorder"   Depression    Heart murmur    "when I was a child"   Hypertension    Type II diabetes mellitus (HCC)     Past Surgical History:  Procedure Laterality Date   AMPUTATION Left 11/05/2016   Procedure: AMPUTATION LEFT GREAT TOE;  Surgeon: Duda, Marcus V, MD;  Location: [facility];  Service: Orthopedics;  Laterality: Left;   CATARACT EXTRACTION Left 07/2020   DRESSING CHANGE UNDER ANESTHESIA N/A 04/13/2016   Procedure: DRESSING CHANGE UNDER ANESTHESIA;  Surgeon: Matthew Martin, MD;  Location: [facility];   Service: General;  Laterality: N/A;   INCISION AND DRAINAGE ABSCESS Right 09/03/2015   Procedure: INCISION AND DRAINAGE ABSCESS;  Surgeon: William Gramig, MD;  Location: [facility];  Service: Orthopedics;  Laterality: Right;   INCISION AND DRAINAGE PERIRECTAL ABSCESS N/A 04/11/2016   Procedure: IRRIGATION AND DEBRIDEMENT RECTAL ABSCESS;  Surgeon: Matthew Martin, MD;  Location: [facility];  Service: General;  Laterality: N/A;   INGUINAL HERNIA REPAIR  1988; 1989   x2    IRRIGATION AND DEBRIDEMENT ABSCESS Left 08/31/2015   Procedure: IRRIGATION AND DEBRIDEMENT ABSCESS LEFT SHOULDER;  Surgeon: David Newman, MD;  Location: [facility];  Service: General;  Laterality: Left;   WOUND DEBRIDEMENT N/A 02/24/2016   Procedure: SACRAL WOUND DEBRIDEMENT;  Surgeon: Burke Thompson, MD;  Location: [facility];  Service: General;  Laterality: N/A;    Current Medications: Current Meds  Medication Sig   acetaminophen (TYLENOL) 325 MG tablet Take 2 tablets (650 mg total) by mouth every 6 (six) hours as needed for mild pain or fever (for pain).   amLODipine (NORVASC) 10 MG tablet Take 1 tablet (10 mg total) by mouth daily.   Blood Glucose Monitoring Suppl (TRUE   METRIX METER) w/Device KIT 1 each by Does not apply route in the morning and at bedtime.   carvedilol (COREG) 3.125 MG tablet Take 4 tablets (12.5 mg total) by mouth 2 (two) times daily with a meal.   chlorthalidone (HYGROTON) 25 MG tablet Take 12.5 mg by mouth daily.   Continuous Blood Gluc Receiver (DEXCOM G6 RECEIVER) DEVI 1 Device by Does not apply route daily.   Continuous Blood Gluc Sensor (DEXCOM G6 SENSOR) MISC USE DAILY AS DIRECTED   Continuous Blood Gluc Transmit (DEXCOM G6 TRANSMITTER) MISC USE AS DIRECTED DAILY   empagliflozin (JARDIANCE) 10 MG TABS tablet 1 tab PO Q Mon/Wed/Frid.  Stop Metformin.   famotidine (PEPCID) 20 MG tablet TAKE 1 TABLET (20 MG TOTAL) BY MOUTH AT BEDTIME.   gabapentin (NEURONTIN) 300 MG capsule TAKE 1 CAPSULE (300 MG TOTAL) BY MOUTH 2 (TWO)  TIMES DAILY. (AM+BEDTIME)   glucose blood (TRUE METRIX BLOOD GLUCOSE TEST) test strip Use as instructed   insulin glargine (LANTUS SOLOSTAR) 100 UNIT/ML Solostar Pen Inject 5 Units into the skin at bedtime.   Insulin Pen Needle (PEN NEEDLES) 31G X 8 MM MISC UAD   KERENDIA 10 MG TABS Take 1 tablet by mouth daily.   pravastatin (PRAVACHOL) 20 MG tablet TAKE 1 TABLET (20 MG TOTAL) BY MOUTH DAILY. (BEDTIME)   valsartan (DIOVAN) 40 MG tablet TAKE 1 TABLET (40 MG TOTAL) BY MOUTH DAILY. (AM)   [DISCONTINUED] metoCLOPramide (REGLAN) 5 MG tablet Twice a day 30 mins prior to lunch and dinner   [DISCONTINUED] ondansetron (ZOFRAN) 4 MG tablet Take 1 tablet (4 mg total) by mouth 2 (two) times daily as needed for nausea or vomiting.     Allergies:   Patient has no known allergies.   Social History   Socioeconomic History   Marital status: Single    Spouse name: Not on file   Number of children: Not on file   Years of education: Not on file   Highest education level: Not on file  Occupational History   Not on file  Tobacco Use   Smoking status: Never   Smokeless tobacco: Never  Vaping Use   Vaping Use: Never used  Substance and Sexual Activity   Alcohol use: Yes    Comment: 11/06/2016 "I'll drink a little at parties; maybe 5 times/year"   Drug use: Yes    Types: Marijuana    Comment: 11/06/2016 "stopped ~ 1 month ago"   Sexual activity: Never    Birth control/protection: Diaphragm  Other Topics Concern   Not on file  Social History Narrative   Not on file   Social Determinants of Health   Financial Resource Strain: Not on file  Food Insecurity: Not on file  Transportation Needs: Not on file  Physical Activity: Not on file  Stress: Not on file  Social Connections: Not on file     Family History: The patient's family history includes Coronary artery disease in his mother; Diabetes type II in his father and mother.  ROS:   Please see the history of present illness.     All other  systems reviewed and are negative.  EKGs/Labs/Other Studies Reviewed:    The following studies were reviewed today:    EKG:    Recent Labs: 03/21/2021: Hemoglobin 10.5; Platelets 308 04/17/2021: ALT 16; BUN 37; Creatinine, Ser 2.71; Potassium 5.0; Sodium 143  Recent Lipid Panel    Component Value Date/Time   CHOL 149 03/21/2021 1536   TRIG 104 03/21/2021 1536     HDL 37 (L) 03/21/2021 1536   CHOLHDL 4.0 03/21/2021 1536   CHOLHDL 4.6 12/06/2019 0749   VLDL 23 12/06/2019 0749   LDLCALC 93 03/21/2021 1536     Risk Assessment/Calculations:           Physical Exam:   Physical Exam: Blood pressure 136/78, pulse 79, height 5' 7" (1.702 m), weight 233 lb 3.2 oz (105.8 kg), SpO2 98 %.       GEN:  obese , young male,  in no acute distress HEENT: Normal NECK: No JVD; No carotid bruits LYMPHATICS: No lymphadenopathy CARDIAC: RRR , no murmurs, rubs, gallops RESPIRATORY:  Clear to auscultation without rales, wheezing or rhonchi  ABDOMEN: Soft, non-tender, non-distended MUSCULOSKELETAL:  No edema; No deformity  SKIN: Warm and dry NEUROLOGIC:  Alert and oriented x 3, partial paralysis of his R side     ASSESSMENT:    1. Hypertension associated with diabetes (HCC)   2. Obesity, morbid (HCC)   3. Chest pain of uncertain etiology     PLAN:      Chest discomfort: Myoview study was normal.  No change in plans.   2.  Hypertension:  Blood pressure is actually very well controlled today.  Blood pressure is quite variable.  He has significant CKD.  I will defer control of his hypertension to his nephrologist.  3.  Morbid obesity: Have advised him to work on cutting back on his carbohydrates.  4.  CKD: His creatinine is above 4.  He will be going to [employer] [facility] and getting on the transplant list at some point.  I suspect that he will need to be started on dialysis fairly soon.     Medication Adjustments/Labs and Tests Ordered: Current medicines are  reviewed at length with the patient today.  Concerns regarding medicines are outlined above.  No orders of the defined types were placed in this encounter.  No orders of the defined types were placed in this encounter.   Patient Instructions  Medication Instructions:  Your physician recommends that you continue on your current medications as directed. Please refer to the Current Medication list given to you today.  *If you need a refill on your cardiac medications before your next appointment, please call your pharmacy*  Follow-Up: At [facility], you and your health needs are our priority.  As part of our continuing mission to provide you with exceptional heart care, we have created designated Provider Care Teams.  These Care Teams include your primary Cardiologist (physician) and Advanced Practice Providers (APPs -  Physician Assistants and Nurse Practitioners) who all work together to provide you with the care you need, when you need it.  Your next appointment:   1 year(s)  The format for your next appointment:   In Person  Provider:  Dr. Nahser  {           Signed, Philip Nahser, MD  11/08/2021 5:17 PM    [facility] 

## 2021-11-08 NOTE — Patient Instructions (Signed)
Medication Instructions:  Your physician recommends that you continue on your current medications as directed. Please refer to the Current Medication list given to you today.  *If you need a refill on your cardiac medications before your next appointment, please call your pharmacy*  Follow-Up: At [facility], you and your health needs are our priority.  As part of our continuing mission to provide you with exceptional heart care, we have created designated Provider Care Teams.  These Care Teams include your primary Cardiologist (physician) and Advanced Practice Providers (APPs -  Physician Assistants and Nurse Practitioners) who all work together to provide you with the care you need, when you need it.  Your next appointment:   1 year(s)  The format for your next appointment:   In Person  Provider:  Dr. Acie Fredrickson  {

## 2021-11-14 ENCOUNTER — Other Ambulatory Visit: Payer: Self-pay | Admitting: Internal Medicine

## 2021-11-14 NOTE — Telephone Encounter (Signed)
Requested Prescriptions  Pending Prescriptions Disp Refills  . Continuous Blood Gluc Sensor (DEXCOM G6 SENSOR) MISC [Pharmacy Med Name: [place] 3 each 2    Sig: USE DAILY AS DIRECTED     Endocrinology: Diabetes - Testing Supplies Passed - 11/14/2021  1:56 PM      Passed - Valid encounter within last 12 months    Recent Outpatient Visits          3 weeks ago Type 2 diabetes mellitus with peripheral neuropathy ([place])   [place] Karle Plumber B, MD   1 month ago Hypertension associated with diabetes [facility])   [place], Jarome Matin, RPH-CPP   3 months ago Hypertension associated with diabetes [facility])   [place], Jarome Matin, RPH-CPP   4 months ago Acute cough   [place], MD   4 months ago Type 2 diabetes mellitus with peripheral neuropathy [facility])   [place], Stephen L, RPH-CPP      Future Appointments            In 1 week Daisy Blossom, Jarome Matin, [place]   In 3 months Wynetta Emery, Dalbert Batman, MD [place]

## 2021-11-27 ENCOUNTER — Ambulatory Visit: Payer: Medicaid Other

## 2021-11-27 ENCOUNTER — Ambulatory Visit: Payer: Medicaid Other | Admitting: Pharmacist

## 2021-11-28 ENCOUNTER — Ambulatory Visit: Payer: Medicaid Other | Attending: Internal Medicine

## 2021-11-28 ENCOUNTER — Other Ambulatory Visit: Payer: Self-pay | Admitting: Pharmacist

## 2021-11-28 DIAGNOSIS — E1142 Type 2 diabetes mellitus with diabetic polyneuropathy: Secondary | ICD-10-CM

## 2021-11-28 MED ORDER — DEXCOM G7 RECEIVER DEVI: 0 refills | Status: DC

## 2021-11-28 MED ORDER — DEXCOM G7 SENSOR MISC: 3 refills | Status: DC

## 2021-12-20 ENCOUNTER — Telehealth: Payer: Self-pay

## 2021-12-20 NOTE — Telephone Encounter (Signed)
Letter was mailed to pt when printed on stated date.------DD,RMA

## 2022-01-01 ENCOUNTER — Ambulatory Visit: Payer: Self-pay | Admitting: Pharmacist

## 2022-01-01 ENCOUNTER — Ambulatory Visit: Payer: Medicaid Other | Admitting: Pharmacist

## 2022-01-18 ENCOUNTER — Other Ambulatory Visit: Payer: Self-pay | Admitting: Internal Medicine

## 2022-01-18 NOTE — Telephone Encounter (Signed)
Requested Prescriptions  Pending Prescriptions Disp Refills   amLODipine (NORVASC) 10 MG tablet [Pharmacy Med Name: AMLODIPINE BESYLATE 10 MG ORAL TABLET] 90 tablet 0    Sig: TAKE ONE TABLET BY MOUTH ONCE DAILY (AM)     Cardiovascular: Calcium Channel Blockers 2 Failed - 01/18/2022  3:35 PM      Failed - Last BP in normal range    BP Readings from Last 1 Encounters:  11/28/21 (!) 153/97         Passed - Last Heart Rate in normal range    Pulse Readings from Last 1 Encounters:  11/28/21 70         Passed - Valid encounter within last 6 months    Recent Outpatient Visits           2 months ago Type 2 diabetes mellitus with peripheral neuropathy ([place])   [place], Deborah B, MD   3 months ago Hypertension associated with diabetes [facility])   [place], Annie Main L, RPH-CPP   5 months ago Hypertension associated with diabetes [facility])   [place], Jarome Matin, RPH-CPP   6 months ago Acute cough   [place], MD   6 months ago Type 2 diabetes mellitus with peripheral neuropathy [facility])   [place], Jarome Matin, RPH-CPP       Future Appointments             In 2 weeks Daisy Blossom, Jarome Matin, [place]   In 1 month Wynetta Emery, Dalbert Batman, MD [place]

## 2022-02-05 ENCOUNTER — Ambulatory Visit: Payer: Medicaid Other | Admitting: Pharmacist

## 2022-02-20 ENCOUNTER — Ambulatory Visit: Payer: Medicaid Other | Attending: Family Medicine | Admitting: Pharmacist

## 2022-02-20 ENCOUNTER — Encounter: Payer: Self-pay | Admitting: Pharmacist

## 2022-02-20 VITALS — BP 126/80 | HR 73

## 2022-02-20 DIAGNOSIS — I152 Hypertension secondary to endocrine disorders: Secondary | ICD-10-CM

## 2022-02-20 DIAGNOSIS — E1159 Type 2 diabetes mellitus with other circulatory complications: Secondary | ICD-10-CM

## 2022-02-20 NOTE — Progress Notes (Signed)
   S:    PCP: Dr. Wynetta Emery  Patient arrives in good spirits. Presents to the clinic for hypertension evaluation, counseling, and management. Patient was referred and last seen by Primary Care Provider on 10/23/2021. BP at that visit was 141/90 at that visit.   Today, pt presents in good spirits. Tells me he took a trip to [place] since his last visit with Dr. Wynetta Emery. He left his medications at home but restarted these Sunday. Pt tells me he took his medications today. I called [facility] and his pill packs are being shipped tomorrow (02/21/2022). I confirmed they have amlodipine, carvedilol, chlorthalidone, and valsartan ready to ship.  Current BP Medications include:  amlodipine 10 mg daily, carvedilol 12.5 mg BID, chlorthalidone 12.5 mg daily, valsartan 40 mg daily  Dietary habits include: not fully compliant with salt restriction. He does not add salt at that table but eats frozen meals.  No fast food or take-out per patient. Denies excessive caffeine.  Exercise habits include: none d/t impaired gait Family / Social history:  Fhx:DM Tobacco: never smoker  Alcohol: very rarely    O:  Vitals:   02/20/22 1528  BP: 126/80  Pulse: 73    Home BP readings: no recent readings  Last 3 Office BP readings: BP Readings from Last 3 Encounters:  02/20/22 126/80  11/28/21 (!) 153/97  11/08/21 136/78    BMET    Component Value Date/Time   NA 143 04/17/2021 1529   K 5.0 04/17/2021 1529   CL 108 (H) 04/17/2021 1529   CO2 23 04/17/2021 1529   GLUCOSE 129 (H) 04/17/2021 1529   GLUCOSE 323 (H) 07/19/2020 1413   BUN 37 (H) 04/17/2021 1529   CREATININE 2.71 (H) 04/17/2021 1529   CREATININE 0.73 04/10/2016 1125   CALCIUM 9.3 04/17/2021 1529   GFRNONAA >60 07/19/2020 1413   GFRNONAA >89 04/10/2016 1125   GFRAA 91 01/27/2020 1209   GFRAA >89 04/10/2016 1125    Renal function: CrCl cannot be calculated (Patient's most recent lab result is older than the maximum 21 days  allowed.).  Clinical ASCVD: No  The ASCVD Risk score (Arnett DK, et al., 2019) failed to calculate for the following reasons:   The 2019 ASCVD risk score is only valid for ages 48 to 34   A/P: Hypertension longstanding currently at goal on current medications. BP Goal = < 130/80 mmHg. Medication adherence is now optimal.  -Continue current regimen. -Counseled on lifestyle modifications for blood pressure control including reduced dietary sodium, increased exercise, adequate sleep.  Results reviewed and written information provided.   Total time in face-to-face counseling 30 minutes.   F/U Clinic Visit with Dr. Wynetta Emery later this week.  Benard Halsted, PharmD, [place], [place] [phone]

## 2022-02-22 ENCOUNTER — Ambulatory Visit: Payer: Medicaid Other | Attending: Internal Medicine | Admitting: Internal Medicine

## 2022-02-22 ENCOUNTER — Telehealth: Payer: Self-pay

## 2022-02-22 ENCOUNTER — Encounter: Payer: Self-pay | Admitting: Internal Medicine

## 2022-02-22 VITALS — BP 154/90 | HR 83 | Temp 98.4°F | Ht 67.0 in | Wt 246.0 lb

## 2022-02-22 DIAGNOSIS — E785 Hyperlipidemia, unspecified: Secondary | ICD-10-CM

## 2022-02-22 DIAGNOSIS — E1159 Type 2 diabetes mellitus with other circulatory complications: Secondary | ICD-10-CM | POA: Diagnosis not present

## 2022-02-22 DIAGNOSIS — Z6838 Body mass index (BMI) 38.0-38.9, adult: Secondary | ICD-10-CM

## 2022-02-22 DIAGNOSIS — E1142 Type 2 diabetes mellitus with diabetic polyneuropathy: Secondary | ICD-10-CM

## 2022-02-22 DIAGNOSIS — E1169 Type 2 diabetes mellitus with other specified complication: Secondary | ICD-10-CM | POA: Diagnosis not present

## 2022-02-22 DIAGNOSIS — Z2821 Immunization not carried out because of patient refusal: Secondary | ICD-10-CM

## 2022-02-22 DIAGNOSIS — I152 Hypertension secondary to endocrine disorders: Secondary | ICD-10-CM

## 2022-02-22 DIAGNOSIS — N184 Chronic kidney disease, stage 4 (severe): Secondary | ICD-10-CM

## 2022-02-22 DIAGNOSIS — E1122 Type 2 diabetes mellitus with diabetic chronic kidney disease: Secondary | ICD-10-CM | POA: Diagnosis not present

## 2022-02-22 DIAGNOSIS — R142 Eructation: Secondary | ICD-10-CM

## 2022-02-22 LAB — POCT GLYCOSYLATED HEMOGLOBIN (HGB A1C): HbA1c, POC (controlled diabetic range): 6.5 % (ref 0.0–7.0)

## 2022-02-22 LAB — GLUCOSE, POCT (MANUAL RESULT ENTRY): POC Glucose: 204 mg/dl — AB (ref 70–99)

## 2022-02-22 MED ORDER — CARVEDILOL 25 MG PO TABS: 25.0000 mg | ORAL_TABLET | Freq: Two times a day (BID) | ORAL | 5 refills | Status: DC

## 2022-02-22 NOTE — Telephone Encounter (Signed)
Dexcom G6 sensor prior auth approved until 02/22/23

## 2022-02-22 NOTE — Progress Notes (Signed)
Patient ID: [name] [name], male    DOB: 12-Dec-1985  MRN: [id number]  CC: Diabetes (Dm f/u./Fill out disability  paperwork. Lynnda Child like referral to gastroenterology and Mri of neck. /)   Subjective: [name] [name] is a 36 y.o. 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   DM/obesity: Results for orders placed or performed in visit on 02/22/22  POCT glucose (manual entry)  Result Value Ref Range   POC Glucose 204 (A) 70 - 99 mg/dl  POCT glycosylated hemoglobin (Hb A1C)  Result Value Ref Range   Hemoglobin A1C     HbA1c POC (<> result, manual entry)     HbA1c, POC (prediabetic range)     HbA1c, POC (controlled diabetic range) 6.5 0.0 - 7.0 %   Patient is on Jardiance.  He also has Lantus insulin 5 units which he takes only when needed. He was trying to switch from the Dexcom 6 to the [facility] 7 but it did not work out.  He just got reapproved for the Dexcom 6.  He has been without a device for several weeks.  He plans to pick up sensors today. Doing okay with his eating habits.  HTN/CKD 4: Has an appointment with his nephrologist on the 14th of this month.  Had blood test done earlier this week through [facility] in preparation for that visit. Gets his medications in blister packs through [employer].  He does not recall the names of his medicines.  According to current med list he should be on Norvasc 10 mg daily Coreg 12.5 mg twice a day Chlorthalidone 25 mg half a tablet daily Diovan 40 mg daily ? Kerendia 10 mg Reports compliance with taking his medications since he got back from [place].  Took his medicines already for  today.  HL: He is on pravastatin.  Requesting referral to gastroenterology for increased burping and gassiness.  He has form for handicap sticker.  He is now driving.  He is requesting handicap sticker because of neuropathy in his legs  HM: He declines the flu shot.  Patient Active Problem List   Diagnosis Date Noted   Chest pain of uncertain etiology 07/86/7544   Gastroparesis 04/17/2021   Abnormality of gait 10/11/2020   Macroalbuminuric diabetic nephropathy ([place]) 08/10/2020   Hemiparesis of right dominant side due to non-cerebrovascular etiology ([place]) 08/10/2020   Diabetic ulcer of left midfoot associated with diabetes mellitus due to underlying condition, limited to breakdown of skin ([place]) 08/10/2020   Cellulitis, leg 03/17/2020   Cellulitis of right leg 03/17/2020   Uncontrolled type 2 diabetes mellitus with complication, with long-term current use of insulin    Abscess    Cellulitis 12/05/2019   Cellulitis of left lower extremity    Abscess of left lower extremity    Uncontrolled type 2 diabetes mellitus with hyperglycemia (HCC)    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 2 diabetes mellitus ([place])    Cellulitis of foot, left 11/22/2016   Diabetic neuropathy ([place]) 11/22/2016   Fracture of 2nd metatarsal 11/22/2016   Fracture of 3rd metatarsal 11/22/2016   Postprocedural non-healing wound 11/22/2016   Acute cystitis with positive culture    Cutaneous abscess of  left foot    Poorly controlled type 2 diabetes mellitus with complication ([place]) 40/98/1191   Hyperlipidemia 10/17/2016   Depression 10/17/2016   Neck pain    Asthma 04/11/2016   Uncontrolled type 2 diabetes mellitus with complication    Obesity, morbid ([place]) 09/06/2015   Hypertension associated with diabetes ([place]) 09/06/2015     Current Outpatient Medications on File Prior to Visit  Medication Sig Dispense Refill   acetaminophen (TYLENOL) 325 MG tablet Take 2 tablets (650 mg total) by mouth every 6 (six) hours as needed for  mild pain or fever (for pain). 10 tablet 0   amLODipine (NORVASC) 10 MG tablet TAKE ONE TABLET BY MOUTH ONCE DAILY (AM) 90 tablet 0   Blood Glucose Monitoring Suppl (TRUE METRIX METER) w/Device KIT 1 each by Does not apply route in the morning and at bedtime. 1 kit 0   chlorthalidone (HYGROTON) 25 MG tablet Take 12.5 mg by mouth daily.     Continuous Blood Gluc Receiver (DEXCOM G7 RECEIVER) DEVI Use to check blood sugar TID. E11.42 1 each 0   Continuous Blood Gluc Transmit (DEXCOM G6 TRANSMITTER) MISC USE AS DIRECTED DAILY 1 each 0   empagliflozin (JARDIANCE) 10 MG TABS tablet 1 tab PO Q Mon/Wed/Frid.  Stop Metformin. 12 tablet 2   famotidine (PEPCID) 20 MG tablet TAKE 1 TABLET (20 MG TOTAL) BY MOUTH AT BEDTIME. 90 tablet 3   gabapentin (NEURONTIN) 300 MG capsule TAKE 1 CAPSULE (300 MG TOTAL) BY MOUTH 2 (TWO) TIMES DAILY. (AM+BEDTIME) 180 capsule 1   glucose blood (TRUE METRIX BLOOD GLUCOSE TEST) test strip Use as instructed 100 each 12   insulin glargine (LANTUS SOLOSTAR) 100 UNIT/ML Solostar Pen Inject 5 Units into the skin at bedtime. 15 mL 11   Insulin Pen Needle (PEN NEEDLES) 31G X 8 MM MISC UAD 100 each 6   KERENDIA 10 MG TABS Take 1 tablet by mouth daily.     pravastatin (PRAVACHOL) 20 MG tablet TAKE 1 TABLET (20 MG TOTAL) BY MOUTH DAILY. (BEDTIME) 90 tablet 1   valsartan (DIOVAN) 40 MG tablet TAKE 1 TABLET (40 MG TOTAL) BY MOUTH DAILY. (AM) 90 tablet 1   Continuous Blood Gluc Sensor (DEXCOM G7 SENSOR) MISC Use to check blood sugar TID. E11.42 (Patient not taking: Reported on 02/22/2022) 3 each 3   No current facility-administered medications on file prior to visit.    No Known Allergies  Social History   Socioeconomic History   Marital status: Single    Spouse name: Not on file   Number of children: Not on file   Years of education: Not on file   Highest education level: Not on file  Occupational History   Not on file  Tobacco Use   Smoking status: Never   Smokeless tobacco:  Never  Vaping Use   Vaping Use: Never used  Substance and Sexual Activity   Alcohol use: Yes    Comment: 11/06/2016 "I'll drink a little at parties; maybe 5 times/year"   Drug use: Yes    Types: Marijuana    Comment: 11/06/2016 "stopped ~ 1 month ago"   Sexual activity: Never    Birth control/protection: Diaphragm  Other Topics Concern   Not on file  Social History Narrative   Not on file   Social Determinants of Health   Financial Resource Strain: Not on file  Food Insecurity: Not on file  Transportation Needs: Not on file  Physical Activity: Not on file  Stress: Not on file  Social Connections: Not on file  Intimate Partner Violence: Not on file    Family History  Problem Relation Age of Onset   Diabetes type II Mother    Coronary artery disease Mother    Diabetes type II Father     Past Surgical History:  Procedure Laterality Date   AMPUTATION Left 11/05/2016   Procedure: AMPUTATION LEFT GREAT TOE;  Surgeon: Newt Minion, MD;  Location: [place];  Service: Orthopedics;  Laterality: Left;   CATARACT EXTRACTION Left 07/2020   DRESSING CHANGE UNDER ANESTHESIA N/A 04/13/2016   Procedure: DRESSING CHANGE UNDER ANESTHESIA;  Surgeon: Johnathan Hausen, MD;  Location: [facility];  Service: General;  Laterality: N/A;   INCISION AND DRAINAGE ABSCESS Right 09/03/2015   Procedure: INCISION AND DRAINAGE ABSCESS;  Surgeon: Roseanne Kaufman, MD;  Location: [facility];  Service: Orthopedics;  Laterality: Right;   INCISION AND DRAINAGE PERIRECTAL ABSCESS N/A 04/11/2016   Procedure: IRRIGATION AND DEBRIDEMENT RECTAL ABSCESS;  Surgeon: Johnathan Hausen, MD;  Location: [facility];  Service: General;  Laterality: N/A;   [place]; 1989   x2    IRRIGATION AND DEBRIDEMENT ABSCESS Left 08/31/2015   Procedure: IRRIGATION AND DEBRIDEMENT ABSCESS LEFT SHOULDER;  Surgeon: Alphonsa Overall, MD;  Location: [facility];  Service: General;  Laterality: Left;   WOUND DEBRIDEMENT N/A 02/24/2016   Procedure: SACRAL  WOUND DEBRIDEMENT;  Surgeon: Georganna Skeans, MD;  Location: [place];  Service: General;  Laterality: N/A;    ROS: Review of Systems Negative except as stated above  PHYSICAL EXAM: BP (!) 154/90   Pulse 83   Temp 98.4 F (36.9 C) (Oral)   Ht _0  (1.702 m)   Wt 246 lb (111.6 kg)   SpO2 99%   BMI 38.53 kg/m   Physical Exam   General appearance - alert, well appearing, and in no distress Mental status - normal mood, behavior, speech, dress, motor activity, and thought processes Neck - supple, no significant adenopathy Chest - clear to auscultation, no wheezes, rales or rhonchi, symmetric air entry Heart - normal rate, regular rhythm, normal S1, S2, no murmurs, rubs, clicks or gallops Extremities -no lower extremity edema.     Latest Ref Rng & Units 04/17/2021    3:29 PM 03/21/2021    3:36 PM 11/24/2020    1:58 PM  CMP  Glucose 70 - 99 mg/dL 129  143  151   BUN 6 - 20 mg/dL 37  34  35   Creatinine 0.76 - 1.27 mg/dL 2.71  2.48  2.12   Sodium 134 - 144 mmol/L 143  142  141   Potassium 3.5 - 5.2 mmol/L 5.0  3.8  4.6   Chloride 96 - 106 mmol/L 108  106  105   CO2 20 - 29 mmol/L _1 Calcium 8.7 - 10.2 mg/dL 9.3  8.8  9.3   Total Protein 6.0 - 8.5 g/dL 6.5  6.0    Total Bilirubin 0.0 - 1.2 mg/dL <0.2  <0.2    Alkaline Phos 44 - 121 IU/L 85  91    AST 0 - 40 IU/L 19  13    ALT 0 - 44 IU/L 16  12     Lipid Panel     Component Value Date/Time   CHOL 149 03/21/2021 1536   TRIG 104 03/21/2021 1536   HDL 37 (L) 03/21/2021 1536   CHOLHDL 4.0 03/21/2021 1536   CHOLHDL 4.6 12/06/2019 0749  VLDL 23 12/06/2019 0749   LDLCALC 93 03/21/2021 1536    CBC    Component Value Date/Time   WBC 10.3 03/21/2021 1536   WBC 10.2 07/19/2020 1413   RBC 3.54 (L) 03/21/2021 1536   RBC 4.13 (L) 07/19/2020 1413   HGB 10.5 (L) 03/21/2021 1536   HCT 30.0 (L) 03/21/2021 1536   PLT 308 03/21/2021 1536   MCV 85 03/21/2021 1536   MCH 29.7 03/21/2021 1536   MCH 28.8 07/19/2020 1413    MCHC 35.0 03/21/2021 1536   MCHC 35.1 07/19/2020 1413   RDW 13.0 03/21/2021 1536   LYMPHSABS 1.9 07/19/2020 1413   LYMPHSABS 1.7 01/27/2020 1209   MONOABS 0.5 07/19/2020 1413   EOSABS 0.2 07/19/2020 1413   EOSABS 0.1 01/27/2020 1209   BASOSABS 0.1 07/19/2020 1413   BASOSABS 0.1 01/27/2020 1209    ASSESSMENT AND PLAN:  1. Type 2 diabetes mellitus with peripheral neuropathy (HCC) A1c is at goal.  He will continue Jardiance. Discussed and encourage healthy eating habits. Patient plans to get sensor today for his Dexcom 6. - POCT glucose (manual entry) - POCT glycosylated hemoglobin (Hb A1C)  2. Hypertension associated with diabetes ([place]) Repeat blood pressure today is better but not at goal. I recommend increasing carvedilol to 25 mg twice a day. Continue:  Chlorthalidone 25 mg half a tablet daily Diovan 40 mg daily ? Kerendia 10 mg Norvasc 10 mg Follow-up with clinical pharmacist in 1 month for repeat blood pressure check. - carvedilol (COREG) 25 MG tablet; Take 1 tablet (25 mg total) by mouth 2 (two) times daily with a meal.  Dispense: 60 tablet; Refill: 5  3. Hyperlipidemia associated with type 2 diabetes mellitus (HCC) Continue pravastatin.  I wanted to do blood test today but patient was a bit reluctant given that he had blood test done earlier this week for nephrologist.  I will await results of blood tests from nephrologist who we will see later this month.  4. CKD stage 4 due to type 2 diabetes mellitus ([place]) Continue Jardiance.  Keep f/u appt with nephrology later this mth  5. Burping Discussed that this may be related to types of foods that he eat.  Recommend keeping track on foods for which she noticed increased bloating and flatulence.  Try switching to 1% milk or Lactaid milk.  Try avoid foods like beans if they increase gassiness.  6. Influenza vaccination declined  7.  Class II obesity with serious comorbidities See #1 above.   Patient was given the  opportunity to ask questions.  Patient verbalized understanding of the plan and was able to repeat key elements of the plan.   This documentation was completed using Radio producer.  Any transcriptional errors are unintentional.  Orders Placed This Encounter  Procedures   POCT glucose (manual entry)   POCT glycosylated hemoglobin (Hb A1C)     Requested Prescriptions   Signed Prescriptions Disp Refills   carvedilol (COREG) 25 MG tablet 60 tablet 5    Sig: Take 1 tablet (25 mg total) by mouth 2 (two) times daily with a meal.    Return in about 4 months (around 06/24/2022) for Appt with [facility] in 2 wks for BP check.  Karle Plumber, MD, FACP

## 2022-02-23 ENCOUNTER — Other Ambulatory Visit: Payer: Self-pay | Admitting: Internal Medicine

## 2022-02-23 ENCOUNTER — Other Ambulatory Visit: Payer: Self-pay

## 2022-02-23 DIAGNOSIS — E1142 Type 2 diabetes mellitus with diabetic polyneuropathy: Secondary | ICD-10-CM

## 2022-02-23 NOTE — Telephone Encounter (Signed)
Medication Refill - Medication:  Continuous Blood Gluc Transmit (DEXCOM G6 TRANSMITTER) MISC  Has the patient contacted their pharmacy? Yes.    (Agent: If yes, when and what did the pharmacy advise?) request from provider  Preferred Pharmacy (with phone number or street name):  [place], [place] Phone: [phone]  Fax: [phone]     Has the patient been seen for an appointment in the last year OR does the patient have an upcoming appointment? Yes.    Agent: Please be advised that RX refills may take up to 3 business days. We ask that you follow-up with your pharmacy.

## 2022-02-26 MED ORDER — DEXCOM G7 SENSOR MISC: 3 refills | Status: DC

## 2022-02-26 NOTE — Telephone Encounter (Signed)
Requested medications are due for refill today.  unsure  Requested medications are on the active medications list.  yes  Last refill. 11/28/2021 #3 3 rf  Future visit scheduled.   yes  Notes to clinic.  Per request pt is not using as of 02/22/2022.    Requested Prescriptions  Pending Prescriptions Disp Refills   Continuous Blood Gluc Sensor (DEXCOM G7 SENSOR) MISC 3 each 3    Sig: Use to check blood sugar TID. E11.42     Endocrinology: Diabetes - Testing Supplies Passed - 02/23/2022  5:17 PM      Passed - Valid encounter within last 12 months    Recent Outpatient Visits           4 days ago Type 2 diabetes mellitus with peripheral neuropathy [facility])   [place] Karle Plumber B, MD   6 days ago Hypertension associated with diabetes [facility])   [place], Jarome Matin, RPH-CPP   4 months ago Type 2 diabetes mellitus with peripheral neuropathy [facility])   [place], MD   5 months ago Hypertension associated with diabetes [facility])   [place], Jarome Matin, RPH-CPP   6 months ago Hypertension associated with diabetes [facility])   [place], Jarome Matin, RPH-CPP       Future Appointments             In 4 weeks Daisy Blossom, Jarome Matin, [place]   In 3 months Wynetta Emery, Dalbert Batman, MD [place]

## 2022-03-01 ENCOUNTER — Other Ambulatory Visit: Payer: Self-pay

## 2022-03-08 ENCOUNTER — Telehealth: Payer: Self-pay

## 2022-03-08 NOTE — Telephone Encounter (Signed)
Prior auth for Dexcom G7 sensor approved until 03/08/2023

## 2022-03-27 ENCOUNTER — Ambulatory Visit: Payer: Medicaid Other | Attending: Internal Medicine | Admitting: Pharmacist

## 2022-03-27 VITALS — BP 145/84 | HR 76

## 2022-03-27 DIAGNOSIS — E1159 Type 2 diabetes mellitus with other circulatory complications: Secondary | ICD-10-CM | POA: Diagnosis not present

## 2022-03-27 DIAGNOSIS — I152 Hypertension secondary to endocrine disorders: Secondary | ICD-10-CM | POA: Diagnosis not present

## 2022-03-27 NOTE — Progress Notes (Signed)
   S:    PCP: Dr. Wynetta Emery  Patient arrives in good spirits. Presents to the clinic for hypertension evaluation, counseling, and management. Patient was referred and last seen by Primary Care Provider on 02/22/2022. BP at that visit was 154/90 mmHg. Of note, I saw him just two days prior (12/5) and BP was at goal. He reported compliance with medications at both visits. Dr. Wynetta Emery increased his carvedilol dose to 25 mg BID.  Today, patient presents in good spirits. He picked-up his blister packs yesterday. He tells me he takes his once daily medications in the evenings, but has taken his AM dose of carvedilol this morning. Of note, he tells me he just started the new carvedilol dose this morning.   Current BP Medications include:  amlodipine 10 mg daily, carvedilol 25 mg BID, chlorthalidone 12.5 mg daily, valsartan 40 mg daily  Dietary habits include: not fully compliant with salt restriction. He does not add salt at that table but eats frozen meals.  No fast food or take-out per patient. Recently started drinking [employer] drinks. Drinks 1-2 of these a week. Exercise habits include: tells me he is more active.  Family / Social history:  Fhx: DM Tobacco: never smoker  Alcohol: very rarely   O:  Vitals:   03/27/22 1521  BP: (!) 145/84  Pulse: 76   Home BP readings: no recent readings  Last 3 Office BP readings: BP Readings from Last 3 Encounters:  03/27/22 (!) 145/84  02/22/22 (!) 154/90  02/20/22 126/80    BMET    Component Value Date/Time   NA 143 04/17/2021 1529   K 5.0 04/17/2021 1529   CL 108 (H) 04/17/2021 1529   CO2 23 04/17/2021 1529   GLUCOSE 129 (H) 04/17/2021 1529   GLUCOSE 323 (H) 07/19/2020 1413   BUN 37 (H) 04/17/2021 1529   CREATININE 2.71 (H) 04/17/2021 1529   CREATININE 0.73 04/10/2016 1125   CALCIUM 9.3 04/17/2021 1529   GFRNONAA >60 07/19/2020 1413   GFRNONAA >89 04/10/2016 1125   GFRAA 91 01/27/2020 1209   GFRAA >89 04/10/2016 1125    Renal  function: CrCl cannot be calculated (Patient's most recent lab result is older than the maximum 21 days allowed.).  Clinical ASCVD: No  The ASCVD Risk score (Arnett DK, et al., 2019) failed to calculate for the following reasons:   The 2019 ASCVD risk score is only valid for ages 52 to 20   A/P: Hypertension longstanding currently above goal on current medications. BP Goal = < 130/80 mmHg. Medication adherence is now optimal. He just started the 25mg  BID dose of carvedilol this morning. Will hold on changes and see him again in 1 month. He has some longstanding issues with medication adherence.  -Continue current regimen. -Counseled on lifestyle modifications for blood pressure control including reduced dietary sodium, increased exercise, adequate sleep.  Results reviewed and written information provided. Total time in face-to-face counseling 30 minutes.  F/U Clinic Visit with me in 1 month.  Benard Halsted, PharmD, [place], [place] [phone]

## 2022-03-28 ENCOUNTER — Telehealth: Payer: Self-pay | Admitting: Internal Medicine

## 2022-03-28 DIAGNOSIS — R1110 Vomiting, unspecified: Secondary | ICD-10-CM

## 2022-03-28 DIAGNOSIS — K3184 Gastroparesis: Secondary | ICD-10-CM

## 2022-03-28 DIAGNOSIS — R140 Abdominal distension (gaseous): Secondary | ICD-10-CM

## 2022-03-28 NOTE — Telephone Encounter (Signed)
-----   Message from Tresa Endo, RPH-CPP sent at 03/27/2022  3:09 PM EST ----- Hey Dr. Wynetta Emery,   Saw this patient today for BP check. He has this history of GERD/acid reflux symptoms, bloating, NV. He brought this up again today. Of note, he a gastric emptying study done ~1 yr ago that showed likely gastroparesis. However, he tells me he wakes up almost daily with vomiting or dry heaving. Denies any abdominal pain but tells me he just feels uncomfortable and bloated.   He requested a referral to GI specialist here at [facility]. Are we able to submit one for him?

## 2022-03-30 ENCOUNTER — Ambulatory Visit: Payer: Medicaid Other | Admitting: Gastroenterology

## 2022-04-03 ENCOUNTER — Ambulatory Visit (INDEPENDENT_AMBULATORY_CARE_PROVIDER_SITE_OTHER): Payer: Medicaid Other | Admitting: Podiatry

## 2022-04-03 ENCOUNTER — Telehealth: Payer: Self-pay | Admitting: Podiatry

## 2022-04-03 DIAGNOSIS — B351 Tinea unguium: Secondary | ICD-10-CM | POA: Diagnosis not present

## 2022-04-03 DIAGNOSIS — M898X7 Other specified disorders of bone, ankle and foot: Secondary | ICD-10-CM

## 2022-04-03 DIAGNOSIS — M79675 Pain in left toe(s): Secondary | ICD-10-CM

## 2022-04-03 DIAGNOSIS — E1121 Type 2 diabetes mellitus with diabetic nephropathy: Secondary | ICD-10-CM | POA: Diagnosis not present

## 2022-04-03 DIAGNOSIS — Z794 Long term (current) use of insulin: Secondary | ICD-10-CM

## 2022-04-03 DIAGNOSIS — S92022P Displaced fracture of anterior process of left calcaneus, subsequent encounter for fracture with malunion: Secondary | ICD-10-CM | POA: Diagnosis not present

## 2022-04-03 DIAGNOSIS — M79674 Pain in right toe(s): Secondary | ICD-10-CM

## 2022-04-03 MED ORDER — MUPIROCIN 2 % EX OINT: 1.0000 | TOPICAL_OINTMENT | Freq: Two times a day (BID) | CUTANEOUS | 2 refills | Status: DC

## 2022-04-03 NOTE — Progress Notes (Signed)
Subjective:  Patient ID: [name] [name], male    DOB: 02-05-1986,  MRN: [id number]  Chief Complaint  Patient presents with   Diabetic Ulcer    Diabetic foot ulcer/ transplant requirement; left foot    37 y.o. male presents with the above complaint. History confirmed with patient.  His A1c is doing much better it is now 6.4%.  He eventually has developed renal failure and is on the transplant list.  Would like to get the ulcer healed prior to having transplant  Objective:  Physical Exam: warm, good capillary refill and normal DP and PT pulses. Strength 4-/5 on right side, ulcer submet 5. 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 Ulcer over sinus tarsi with hard bony spur underneath.  Measures approximately 1.0 x 0.5 cm, hyperkeratosis around this with fibrogranular base. No signs of infection. No exposed bone. Onychomycosis with thickened elongated nails and subungual debris x8  Radiographs: X-ray of the left foot: partial ray amputation of 1st, 2nd toe amp, HO formation around 2nd and 3rd metatarsals distally, large bony prominence over CCJ, degenerative changes in STJ, midtarsal joint. Appears to be anterior process fractured off     CT 02/29/2020 IMPRESSION: 1. 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 2. 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 Assessment:   No diagnosis found.    Plan:  Patient was evaluated and treated and all questions answered.  Patient educated on diabetes. Discussed proper diabetic foot care and discussed risks and complications of disease. Educated patient in depth on reasons to return to the office immediately should he/she discover anything concerning or new on the feet.  All questions answered. Discussed proper shoes as well.   Discussed the etiology and treatment options for the condition in detail with the patient. Educated patient on the topical and oral treatment options for mycotic nails. Recommended debridement of the nails today. Sharp and mechanical debridement performed of all painful and mycotic nails today. Nails debrided in length and thickness using a nail nipper to level of comfort. Discussed treatment options including appropriate shoe gear. Follow up as needed for painful nails.    Ulcer left lateral foot -We again discussed the presence of the ulcer and the underlying bony exostosis from previous fracture or injury and arthritic changes in the calcaneocuboid joint.  I do think he should hopefully be able to heal the ulceration and that his A1c is well-controlled once the bony prominence has been removed.  He has made all other nonsurgical attempts at local wound care to heal this so far.  We discussed the risk and benefits of the procedure including but limited to  pain, swelling, infection, scar, numbness which may be temporary or permanent, chronic pain, stiffness, nerve pain or damage, wound healing problems, as well as the increased risk from his diabetes and end-stage renal disease although it is now well-controlled.  All questions were addressed.  Informed consent was signed and reviewed.  Surgery be scheduled mutually agreeable date.  I recommend he be nonweightbearing after surgery and use a knee scooter which he will rent.   Surgical plan:  Procedure: -Exostectomy of calcaneus and cuboid of underlying bone, possible excision of wound  Location: -[facility]  Anesthesia plan: -IV sedation with regional block  Postoperative pain plan: -  Percocet 5/325 every 6 hours as needed  DVT prophylaxis: -ASA 325 mg twice daily  WB Restrictions / DME needs: -NWB in splint postop with knee scooter         No follow-ups on file.

## 2022-04-03 NOTE — Telephone Encounter (Addendum)
DOS: 04/06/2022  [place] Medicaid [employer]  Calcaneal Ostectomy Lt [phone]) Tarsal Exostectomy Lt [phone])  Prior authorization is not required per fax received from [facility].

## 2022-04-03 NOTE — Addendum Note (Signed)
Addended bySherryle Lis, Clerance Umland R on: 04/03/2022 09:24 AM   Modules accepted: Orders

## 2022-04-06 ENCOUNTER — Other Ambulatory Visit: Payer: Self-pay | Admitting: Podiatry

## 2022-04-06 DIAGNOSIS — M25775 Osteophyte, left foot: Secondary | ICD-10-CM | POA: Diagnosis not present

## 2022-04-06 MED ORDER — OXYCODONE-ACETAMINOPHEN 5-325 MG PO TABS: 1.0000 | ORAL_TABLET | ORAL | 0 refills | Status: AC | PRN

## 2022-04-06 MED ORDER — DOXYCYCLINE HYCLATE 100 MG PO TABS: 100.0000 mg | ORAL_TABLET | Freq: Two times a day (BID) | ORAL | 0 refills | Status: DC

## 2022-04-12 ENCOUNTER — Ambulatory Visit (INDEPENDENT_AMBULATORY_CARE_PROVIDER_SITE_OTHER): Payer: Medicaid Other | Admitting: Podiatry

## 2022-04-12 ENCOUNTER — Ambulatory Visit (INDEPENDENT_AMBULATORY_CARE_PROVIDER_SITE_OTHER): Payer: Medicaid Other

## 2022-04-12 DIAGNOSIS — M898X7 Other specified disorders of bone, ankle and foot: Secondary | ICD-10-CM

## 2022-04-12 DIAGNOSIS — S92022P Displaced fracture of anterior process of left calcaneus, subsequent encounter for fracture with malunion: Secondary | ICD-10-CM

## 2022-04-16 NOTE — Progress Notes (Signed)
  Subjective:  Patient ID: [name] [name], male    DOB: [birth year],  MRN: [id number]  Chief Complaint  Patient presents with   Routine Post Op    POV #1 DOS 04/06/2022 EXCISION OF BONE SIDE OF HEEL AND LT ANKLE      37 y.o. male returns for post-op check.  He is doing very well not having much pain  Review of Systems: Negative except as noted in the HPI. Denies N/V/F/Ch.   Objective:  There were no vitals filed for this visit. There is no height or weight on file to calculate BMI. Constitutional Well developed. Well nourished.  Vascular Foot warm and well perfused. Capillary refill normal to all digits.  Calf is soft and supple, no posterior calf or knee pain, negative Homans' sign  Neurologic Normal speech. Oriented to person, place, and time. Epicritic sensation to light touch grossly present bilaterally.  Dermatologic Skin healing well without signs of infection. Skin edges well coapted without signs of infection.  Ulcer appears nearly healed  Orthopedic: He has mild edema and no tenderness to palpation noted about the surgical site.   Multiple view plain film radiographs: Good reduction of bony process Assessment:   1. Exostosis of left foot   2. Closed displaced fracture of anterior process of left calcaneus with malunion, subsequent encounter    Plan:  Patient was evaluated and treated and all questions answered.  S/p foot surgery left -Progressing as expected post-operatively. -XR: Noted above -WB Status: WBAT in cam walker boot.  He may return to regular shoe gear and activity once sutures are out -Sutures: Return in 2 weeks for removal. -Medications: No refills required.  He will continue every other day dressing changes.  May resume regular bathing    Return in about 3 months (around 07/12/2022) for post op (no x-rays), suture removal.

## 2022-04-24 ENCOUNTER — Other Ambulatory Visit: Payer: Self-pay | Admitting: Internal Medicine

## 2022-04-24 DIAGNOSIS — E1142 Type 2 diabetes mellitus with diabetic polyneuropathy: Secondary | ICD-10-CM

## 2022-04-25 NOTE — Telephone Encounter (Signed)
Future visit in 2 months.  Requested Prescriptions  Pending Prescriptions Disp Refills   amLODipine (NORVASC) 10 MG tablet [Pharmacy Med Name: AMLODIPINE BESYLATE 10 MG ORAL TABLET] 90 tablet 0    Sig: TAKE ONE TABLET BY MOUTH ONCE DAILY (AM)     Cardiovascular: Calcium Channel Blockers 2 Failed - 04/24/2022 12:13 PM      Failed - Last BP in normal range    BP Readings from Last 1 Encounters:  03/27/22 (!) 145/84         Passed - Last Heart Rate in normal range    Pulse Readings from Last 1 Encounters:  03/27/22 76         Passed - Valid encounter within last 6 months    Recent Outpatient Visits           4 weeks ago Hypertension associated with diabetes [facility])   [place], [place] L, RPH-CPP   2 months ago Type 2 diabetes mellitus with peripheral neuropathy [facility])   [place] Karle Plumber B, MD   2 months ago Hypertension associated with diabetes [facility])   [place], [place] L, RPH-CPP   6 months ago Type 2 diabetes mellitus with peripheral neuropathy [facility])   [place] Karle Plumber B, MD   7 months ago Hypertension associated with diabetes [facility])   [place], RPH-CPP       Future Appointments             In 6 days Daisy Blossom, Jarome Matin, [place]   In 2 months Ladell Pier, MD [place]             gabapentin (NEURONTIN) 300 MG capsule [Pharmacy Med Name: GABAPENTIN 300 MG ORAL CAPSULE] 180 capsule 0    Sig: TAKE 1 CAPSULE (300 MG TOTAL) BY MOUTH 2 (TWO) TIMES DAILY. (AM+BEDTIME)     Neurology: Anticonvulsants - gabapentin Failed - 04/24/2022 12:13 PM      Failed - Cr in normal range and within 360 days    Creat  Date Value Ref Range Status   04/10/2016 0.73 0.60 - 1.35 mg/dL Final   Creatinine, Ser  Date Value Ref Range Status  04/17/2021 2.71 (H) 0.76 - 1.27 mg/dL Final         Passed - Completed PHQ-2 or PHQ-9 in the last 360 days      Passed - Valid encounter within last 12 months    Recent Outpatient Visits           4 weeks ago Hypertension associated with diabetes [facility])   [place], [place] L, RPH-CPP   2 months ago Type 2 diabetes mellitus with peripheral neuropathy [facility])   [place] Karle Plumber B, MD   2 months ago Hypertension associated with diabetes [facility])   [place], [place] L, RPH-CPP   6 months ago Type 2 diabetes mellitus with peripheral neuropathy [facility])   [place], MD   7 months ago Hypertension associated with diabetes [facility])   [place], RPH-CPP  Future Appointments             In 6 days Daisy Blossom, Jarome Matin, [place]   In 2 months Ladell Pier, MD [place]             pravastatin (PRAVACHOL) 20 MG tablet [Pharmacy Med Name: PRAVASTATIN SODIUM 20 MG ORAL TABLET] 90 tablet 0    Sig: TAKE 1 TABLET (20 MG TOTAL) BY MOUTH DAILY. (BEDTIME)     Cardiovascular:  Antilipid - Statins Failed - 04/24/2022 12:13 PM      Failed - Lipid Panel in normal range within the last 12 months    Cholesterol, Total  Date Value Ref Range Status  03/21/2021 149 100 - 199 mg/dL Final   LDL Chol Calc (NIH)  Date Value Ref Range Status  03/21/2021 93 0 - 99 mg/dL Final   HDL  Date Value Ref Range Status  03/21/2021 37 (L) >39 mg/dL Final   Triglycerides  Date Value Ref Range Status  03/21/2021 104 0 - 149 mg/dL Final         Passed - Patient is not pregnant       Passed - Valid encounter within last 12 months    Recent Outpatient Visits           4 weeks ago Hypertension associated with diabetes [facility])   [place] Ausdall, [place] L, RPH-CPP   2 months ago Type 2 diabetes mellitus with peripheral neuropathy [facility])   [place] Karle Plumber B, MD   2 months ago Hypertension associated with diabetes [facility])   [place], [place] L, RPH-CPP   6 months ago Type 2 diabetes mellitus with peripheral neuropathy [facility])   [place], MD   7 months ago Hypertension associated with diabetes [facility])   [place], [place] L, RPH-CPP       Future Appointments             In 6 days Daisy Blossom, Jarome Matin, [place]   In 2 months Wynetta Emery, Dalbert Batman, MD [place]

## 2022-04-26 ENCOUNTER — Ambulatory Visit (INDEPENDENT_AMBULATORY_CARE_PROVIDER_SITE_OTHER): Payer: Medicaid Other | Admitting: *Deleted

## 2022-04-26 DIAGNOSIS — S92022P Displaced fracture of anterior process of left calcaneus, subsequent encounter for fracture with malunion: Secondary | ICD-10-CM

## 2022-04-26 DIAGNOSIS — M898X7 Other specified disorders of bone, ankle and foot: Secondary | ICD-10-CM

## 2022-04-26 DIAGNOSIS — Z9889 Other specified postprocedural states: Secondary | ICD-10-CM

## 2022-04-26 NOTE — Progress Notes (Signed)
Patient presents today for post op visit # 2, patient of Dr. Sherryle Lis.   POV #2 DOS 04/06/2022 EXCISION OF BONE SIDE OF HEEL AND LT ANKLE    Sutures removed today. Patient states he is feeling good, not having any pain in the foot. He has already been washing it and wearing regular shoes. He has been wrapping up with gauze daily.   Reviewed icing and elevation. Advised he didn't need to wrap with gauze anymore. Redressed foot with acewrap to help control swelling. Patient will follow up with Dr. Sherryle Lis for POV# 3 in 1 month.

## 2022-05-01 ENCOUNTER — Encounter: Payer: Self-pay | Admitting: Pharmacist

## 2022-05-01 ENCOUNTER — Ambulatory Visit: Payer: Medicaid Other | Attending: Internal Medicine | Admitting: Pharmacist

## 2022-05-01 VITALS — BP 125/75

## 2022-05-01 DIAGNOSIS — E1159 Type 2 diabetes mellitus with other circulatory complications: Secondary | ICD-10-CM

## 2022-05-01 DIAGNOSIS — I152 Hypertension secondary to endocrine disorders: Secondary | ICD-10-CM | POA: Diagnosis not present

## 2022-05-01 NOTE — Progress Notes (Signed)
   S:    PCP: Dr. Wynetta Emery  Patient arrives in good spirits. Presents to the clinic for hypertension evaluation, counseling, and management. Patient was referred and last seen by Primary Care Provider on 02/22/2022. I saw him on 03/27/22 and made no changes to his medications.   Today, patient presents in good spirits. He is current on his blister packs from [place]. He brings the list of medications that are currently in his pack and all antihypertensives are consistent with what we have on file for him. He tells me he takes his once daily medications in the evenings, but has taken his AM dose of carvedilol this morning.   Current BP Medications include:  amlodipine 10 mg daily, carvedilol 25 mg BID, chlorthalidone 12.5 mg daily, valsartan 40 mg daily  Dietary habits include: not fully compliant with salt restriction. He does not add salt at that table but eats frozen meals.  No fast food or take-out per patient. Recently started drinking [employer] drinks. Drinks 1-2 of these a week. Exercise habits include: tells me he is more active.  Family / Social history:  Fhx: DM Tobacco: never smoker  Alcohol: very rarely   O:  Vitals:   05/01/22 1501  BP: 125/75   Home BP readings: no recent readings  Last 3 Office BP readings: BP Readings from Last 3 Encounters:  05/01/22 125/75  03/27/22 (!) 145/84  02/22/22 (!) 154/90    BMET    Component Value Date/Time   NA 143 04/17/2021 1529   K 5.0 04/17/2021 1529   CL 108 (H) 04/17/2021 1529   CO2 23 04/17/2021 1529   GLUCOSE 129 (H) 04/17/2021 1529   GLUCOSE 323 (H) 07/19/2020 1413   BUN 37 (H) 04/17/2021 1529   CREATININE 2.71 (H) 04/17/2021 1529   CREATININE 0.73 04/10/2016 1125   CALCIUM 9.3 04/17/2021 1529   GFRNONAA >60 07/19/2020 1413   GFRNONAA >89 04/10/2016 1125   GFRAA 91 01/27/2020 1209   GFRAA >89 04/10/2016 1125    Renal function: CrCl cannot be calculated (Patient's most recent lab result is older than the maximum  21 days allowed.).  Clinical ASCVD: No  The ASCVD Risk score (Arnett DK, et al., 2019) failed to calculate for the following reasons:   The 2019 ASCVD risk score is only valid for ages 58 to 60   A/P: Hypertension longstanding currently at goal on current medications. BP Goal = < 130/80 mmHg. Medication adherence is now optimal. Commended him for this! -Continue current regimen. -Counseled on lifestyle modifications for blood pressure control including reduced dietary sodium, increased exercise, adequate sleep.  Results reviewed and written information provided. Total time in face-to-face counseling 30 minutes.  F/U Clinic Visit with me prn. Dr. Wynetta Emery in April.  Benard Halsted, PharmD, [place], [place] [phone]

## 2022-05-02 ENCOUNTER — Telehealth: Payer: Self-pay | Admitting: Internal Medicine

## 2022-05-02 DIAGNOSIS — Z7682 Awaiting organ transplant status: Secondary | ICD-10-CM

## 2022-05-02 NOTE — Telephone Encounter (Signed)
-----   Message from Tresa Endo, [place] sent at 05/01/2022  5:47 PM EST ----- [place] friend,   This patient is endorsing increased R sided neuropathy symptoms. Describes burning, tingling pain on his R side, hands, and feet. His R side is the one impacted by the tumor removal surgery. He wonders if you would consider increasing his gabapentin.   Additionally, he requests a psych referral. I reviewed his note (Care Everywhere) from the transplant team at [facility] from 12/2021. They are wanting him cleared from a psych perspective. He reiterated this today. I told him to seek a referral from that team but he said they told him his PCP should do that. Are you able to put in for a referral?   I'm sorry about the additional requests outside of HTN control. He's doing much better with adherence and HTN control. His BP today was at goal.   Thank you for including me,   Lurena Joiner

## 2022-05-04 ENCOUNTER — Ambulatory Visit: Payer: Medicaid Other | Admitting: Gastroenterology

## 2022-05-15 ENCOUNTER — Ambulatory Visit (INDEPENDENT_AMBULATORY_CARE_PROVIDER_SITE_OTHER): Payer: Medicaid Other | Admitting: Gastroenterology

## 2022-05-15 ENCOUNTER — Encounter: Payer: Self-pay | Admitting: Gastroenterology

## 2022-05-15 VITALS — BP 92/60 | HR 68 | Ht 66.0 in | Wt 253.4 lb

## 2022-05-15 DIAGNOSIS — E1165 Type 2 diabetes mellitus with hyperglycemia: Secondary | ICD-10-CM | POA: Diagnosis not present

## 2022-05-15 DIAGNOSIS — E1142 Type 2 diabetes mellitus with diabetic polyneuropathy: Secondary | ICD-10-CM | POA: Diagnosis not present

## 2022-05-15 DIAGNOSIS — G8191 Hemiplegia, unspecified affecting right dominant side: Secondary | ICD-10-CM

## 2022-05-15 DIAGNOSIS — R112 Nausea with vomiting, unspecified: Secondary | ICD-10-CM | POA: Diagnosis not present

## 2022-05-15 DIAGNOSIS — R140 Abdominal distension (gaseous): Secondary | ICD-10-CM | POA: Diagnosis not present

## 2022-05-15 NOTE — Patient Instructions (Signed)
You have been scheduled for an endoscopy at [facility]. Please follow written instructions given to you at your visit today. If you use inhalers (even only as needed), please bring them with you on the day of your procedure.    _______________________________________________________  If your blood pressure at your visit was 140/90 or greater, please contact your primary care physician to follow up on this.  _______________________________________________________  If you are age 40 or younger, your body mass index should be between 19-25. Your Body mass index is 40.9 kg/m. If this is out of the aformentioned range listed, please consider follow up with your Primary Care Provider.   __________________________________________________________  The [facility] providers would like to encourage you to use [facility] to communicate with providers for non-urgent requests or questions.  Due to long hold times on the telephone, sending your provider a message by [facility] may be a faster and more efficient way to get a response.  Please allow 48 business hours for a response.  Please remember that this is for non-urgent requests.    Due to recent changes in healthcare laws, you may see the results of your imaging and laboratory studies on MyChart before your provider has had a chance to review them.  We understand that in some cases there may be results that are confusing or concerning to you. Not all laboratory results come back in the same time frame and the provider may be waiting for multiple results in order to interpret others.  Please give [place] 48 hours in order for your provider to thoroughly review all the results before contacting the office for clarification of your results.    Thank you for choosing me and [facility].  Vito Cirigliano, D.O.

## 2022-05-15 NOTE — H&P (View-Only) (Signed)
            Chief Complaint: Bloating, nausea/vomiting   Referring Provider:     Johnson, Deborah B, MD   HPI:     [name] [name] is a 36 y.o. 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  Has had n/v every AM immediately after drinking water. Has been present 1-2 years. No sxs at bedtime, overnight, or when he first wakes; only after he first ingests anything, including water. Nausea then resolves after emesis, and goes about his day without recurrence.  Appetite otherwise preserved and eats meals the remainder of the day.  No early satiety per se.  Had tried changing meds without change in sxs.   No HB, regurgitation. No dysphagia. Takes famotidine 20 mg qhs.   Receives all meds in a bubble pack and takes all at directed.   - 03/27/2021: RUQ US: Minimal cholelithiasis without cholecystitis.  Otherwise normal - 03/29/2021: GES: 88% emptying at 4 hours.  Slightly delayed gastric emptying - 12/04/2021: CT A/P: Normal-appearing GI tract.  Liver, spleen, pancreas normal.   MGF with Colon CA. No FDR with GI malignancy, IBD, or hepatobiliary disease.    Past Medical History:  Diagnosis Date   Arthritis    "neck; maybe in my fingers" (11/06/2016)   Asthma    Bipolar disorder (HCC)    "IED: intentional deficit disorder"   Depression    Heart murmur    "when I was a child"   Hypertension    Type II diabetes mellitus (HCC)      Past Surgical History:  Procedure Laterality Date   AMPUTATION Left 11/05/2016   Procedure: AMPUTATION LEFT GREAT TOE;  Surgeon: Duda, Marcus V, MD;  Location: [facility] OR;  Service: Orthopedics;  Laterality: Left;   CATARACT  EXTRACTION Left 07/2020   DRESSING CHANGE UNDER ANESTHESIA N/A 04/13/2016   Procedure: DRESSING CHANGE UNDER ANESTHESIA;  Surgeon: Matthew Martin, MD;  Location: [facility] ORS;  Service: General;  Laterality: N/A;   INCISION AND DRAINAGE ABSCESS Right 09/03/2015   Procedure: INCISION AND DRAINAGE ABSCESS;  Surgeon: William Gramig, MD;  Location: [facility]S;  Service: Orthopedics;  Laterality: Right;   INCISION AND DRAINAGE PERIRECTAL ABSCESS N/A 04/11/2016   Procedure: IRRIGATION AND DEBRIDEMENT RECTAL ABSCESS;  Surgeon: Matthew Martin, MD;  Location: [facility];  Service: General;  Laterality: N/A;   INGUINAL HERNIA REPAIR  1988; 1989   x2    IRRIGATION AND DEBRIDEMENT ABSCESS Left 08/31/2015   Procedure: IRRIGATION AND DEBRIDEMENT ABSCESS LEFT SHOULDER;  Surgeon: David Newman, MD;  Location: [facility];  Service: General;  Laterality: Left;   WOUND DEBRIDEMENT N/A 02/24/2016   Procedure: SACRAL WOUND DEBRIDEMENT;  Surgeon: Burke Thompson, MD;  Location: [facility] OR;  Service: General;  Laterality: N/A;   Family History  Problem Relation Age of Onset   Diabetes type II Mother    Coronary artery disease Mother    Diabetes type II Father    Social History   Tobacco Use   Smoking status: Never   Smokeless tobacco: Never  Vaping Use   Vaping Use: Never used  Substance Use Topics   Alcohol use: Yes    Comment: 11/06/2016 "I'll drink a little at parties; maybe   5 times/year"   Drug use: Yes    Types: Marijuana    Comment: 11/06/2016 "stopped ~ 1 month ago"   Current Outpatient Medications  Medication Sig Dispense Refill   acetaminophen (TYLENOL) 325 MG tablet Take 2 tablets (650 mg total) by mouth every 6 (six) hours as needed for mild pain or fever (for pain). 10 tablet 0   amLODipine (NORVASC) 10 MG tablet TAKE ONE TABLET BY MOUTH ONCE DAILY (AM) 90 tablet 0   Blood Glucose Monitoring Suppl (TRUE METRIX METER) w/Device KIT 1 each by Does not apply route in the morning and at bedtime. 1 kit 0   carvedilol (COREG)  25 MG tablet Take 1 tablet (25 mg total) by mouth 2 (two) times daily with a meal. 60 tablet 5   chlorthalidone (HYGROTON) 25 MG tablet Take 12.5 mg by mouth daily.     Continuous Blood Gluc Receiver (DEXCOM G7 RECEIVER) DEVI Use to check blood sugar TID. E11.42 1 each 0   Continuous Blood Gluc Sensor (DEXCOM G7 SENSOR) MISC Use to check blood sugar TID. E11.42 3 each 3   Continuous Blood Gluc Transmit (DEXCOM G6 TRANSMITTER) MISC USE AS DIRECTED DAILY 1 each 0   doxycycline (VIBRA-TABS) 100 MG tablet Take 1 tablet (100 mg total) by mouth 2 (two) times daily. 20 tablet 0   empagliflozin (JARDIANCE) 10 MG TABS tablet 1 tab PO Q Mon/Wed/Frid.  Stop Metformin. 12 tablet 2   famotidine (PEPCID) 20 MG tablet TAKE 1 TABLET (20 MG TOTAL) BY MOUTH AT BEDTIME. 90 tablet 3   gabapentin (NEURONTIN) 300 MG capsule TAKE 1 CAPSULE (300 MG TOTAL) BY MOUTH 2 (TWO) TIMES DAILY. (AM+BEDTIME) 180 capsule 0   glucose blood (TRUE METRIX BLOOD GLUCOSE TEST) test strip Use as instructed 100 each 12   insulin glargine (LANTUS SOLOSTAR) 100 UNIT/ML Solostar Pen Inject 5 Units into the skin at bedtime. 15 mL 11   Insulin Pen Needle (PEN NEEDLES) 31G X 8 MM MISC UAD 100 each 6   KERENDIA 10 MG TABS Take 1 tablet by mouth daily.     mupirocin ointment (BACTROBAN) 2 % Apply 1 Application topically 2 (two) times daily. 30 g 2   pravastatin (PRAVACHOL) 20 MG tablet TAKE 1 TABLET (20 MG TOTAL) BY MOUTH DAILY. (BEDTIME) 90 tablet 0   valsartan (DIOVAN) 40 MG tablet TAKE 1 TABLET (40 MG TOTAL) BY MOUTH DAILY. (AM) 90 tablet 1   No current facility-administered medications for this visit.   No Known Allergies   Review of Systems: All systems reviewed and negative except where noted in HPI.     Physical Exam:    Wt Readings from Last 3 Encounters:  02/22/22 246 lb (111.6 kg)  11/08/21 233 lb 3.2 oz (105.8 kg)  10/23/21 231 lb 3.2 oz (104.9 kg)    There were no vitals taken for this visit. Constitutional:  Pleasant,  in no acute distress. Cardiovascular: Normal rate, regular rhythm. No edema Pulmonary/chest: Effort normal and breath sounds normal. No wheezing, rales or rhonchi. Abdominal: Soft, nondistended, nontender. Bowel sounds active throughout. There are no masses palpable. No hepatomegaly.   ASSESSMENT AND PLAN;   1) Nausea/Vomiting 2) Abdominal bloating Unclear etiology, but discussed DDx to include gastroparesis (mildly reduced gastric emptying on previous GES) atypical reflux, medication ADR effect, etc. with plan for the following:  - EGD to evaluate for mucosal/luminal pathology to include GOO, PUD, H. pylori, etc. - Will hold off on making medication changes endoscopic findings first.    If EGD otherwise unrevealing, may consider trial of high-dose PPI for diagnostic and therapeutic intent - May consider trial of prophylactic antiemetics at bedtime, pending endoscopic findings  3) ESRD 4) Obesity 5) Diabetes 6) Right-sided hemiplegia s/p previous C-spine surgery - Procedure to be done at [facility] due to elevated periprocedural risks  The indications, risks, and benefits of EGD were explained to the patient in detail. Risks include but are not limited to bleeding, perforation, adverse reaction to medications, and cardiopulmonary compromise. Sequelae include but are not limited to the possibility of surgery, hospitalization, and mortality. The patient verbalized understanding and wished to proceed. All questions answered, referred to scheduler. Further recommendations pending results of the exam.     Jaslyne Beeck V Luqman Perrelli, DO, FACG  05/15/2022, 2:18 PM   Johnson, Deborah B, MD  

## 2022-05-15 NOTE — Progress Notes (Signed)
Chief Complaint: Bloating, nausea/vomiting   Referring Provider:     Ladell Pier, MD   HPI:     [name] [name] is a 37 y.o. 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  Has had n/v every AM immediately after drinking water. Has been present 1-2 years. No sxs at bedtime, overnight, or when he first wakes; only after he first ingests anything, including water. Nausea then resolves after emesis, and goes about his day without recurrence.  Appetite otherwise preserved and eats meals the remainder of the day.  No early satiety per se.  Had tried changing meds without change in sxs.   No HB, regurgitation. No dysphagia. Takes famotidine 20 mg qhs.   Receives all meds in a bubble pack and takes all at directed.   - 03/27/2021: RUQ [place]: Minimal cholelithiasis without cholecystitis.  Otherwise normal - 03/29/2021: GES: 88% emptying at 4 hours.  Slightly delayed gastric emptying - 12/04/2021: CT A/P: Normal-appearing GI tract.  Liver, spleen, pancreas normal.   MGF with Colon CA. No FDR with GI malignancy, IBD, or hepatobiliary disease.    Past Medical History:  Diagnosis Date   Arthritis    "neck; maybe in my fingers" (11/06/2016)   Asthma    Bipolar disorder ([place])    "IED: intentional deficit disorder"   Depression    Heart murmur    "when I was a child"   Hypertension    Type II diabetes mellitus ([place])      Past Surgical History:  Procedure Laterality Date   AMPUTATION Left 11/05/2016   Procedure: AMPUTATION LEFT GREAT TOE;  Surgeon: Newt Minion, MD;  Location: [facility];  Service: Orthopedics;  Laterality: Left;   CATARACT  EXTRACTION Left 07/2020   DRESSING CHANGE UNDER ANESTHESIA N/A 04/13/2016   Procedure: DRESSING CHANGE UNDER ANESTHESIA;  Surgeon: Johnathan Hausen, MD;  Location: [facility];  Service: General;  Laterality: N/A;   INCISION AND DRAINAGE ABSCESS Right 09/03/2015   Procedure: INCISION AND DRAINAGE ABSCESS;  Surgeon: Roseanne Kaufman, MD;  Location: [facility];  Service: Orthopedics;  Laterality: Right;   INCISION AND DRAINAGE PERIRECTAL ABSCESS N/A 04/11/2016   Procedure: IRRIGATION AND DEBRIDEMENT RECTAL ABSCESS;  Surgeon: Johnathan Hausen, MD;  Location: [facility];  Service: General;  Laterality: N/A;   [place]; 1989   x2    IRRIGATION AND DEBRIDEMENT ABSCESS Left 08/31/2015   Procedure: IRRIGATION AND DEBRIDEMENT ABSCESS LEFT SHOULDER;  Surgeon: Alphonsa Overall, MD;  Location: [facility];  Service: General;  Laterality: Left;   WOUND DEBRIDEMENT N/A 02/24/2016   Procedure: SACRAL WOUND DEBRIDEMENT;  Surgeon: Georganna Skeans, MD;  Location: [facility];  Service: General;  Laterality: N/A;   Family History  Problem Relation Age of Onset   Diabetes type II Mother    Coronary artery disease Mother    Diabetes type II Father    Social History   Tobacco Use   Smoking status: Never   Smokeless tobacco: Never  Vaping Use   Vaping Use: Never used  Substance Use Topics   Alcohol use: Yes    Comment: 11/06/2016 "I'll drink a little at parties; maybe  5 times/year"   Drug use: Yes    Types: Marijuana    Comment: 11/06/2016 "stopped ~ 1 month ago"   Current Outpatient Medications  Medication Sig Dispense Refill   acetaminophen (TYLENOL) 325 MG tablet Take 2 tablets (650 mg total) by mouth every 6 (six) hours as needed for mild pain or fever (for pain). 10 tablet 0   amLODipine (NORVASC) 10 MG tablet TAKE ONE TABLET BY MOUTH ONCE DAILY (AM) 90 tablet 0   Blood Glucose Monitoring Suppl (TRUE METRIX METER) w/Device KIT 1 each by Does not apply route in the morning and at bedtime. 1 kit 0   carvedilol (COREG)  25 MG tablet Take 1 tablet (25 mg total) by mouth 2 (two) times daily with a meal. 60 tablet 5   chlorthalidone (HYGROTON) 25 MG tablet Take 12.5 mg by mouth daily.     Continuous Blood Gluc Receiver (DEXCOM G7 RECEIVER) DEVI Use to check blood sugar TID. E11.42 1 each 0   Continuous Blood Gluc Sensor (DEXCOM G7 SENSOR) MISC Use to check blood sugar TID. E11.42 3 each 3   Continuous Blood Gluc Transmit (DEXCOM G6 TRANSMITTER) MISC USE AS DIRECTED DAILY 1 each 0   doxycycline (VIBRA-TABS) 100 MG tablet Take 1 tablet (100 mg total) by mouth 2 (two) times daily. 20 tablet 0   empagliflozin (JARDIANCE) 10 MG TABS tablet 1 tab PO Q Mon/Wed/Frid.  Stop Metformin. 12 tablet 2   famotidine (PEPCID) 20 MG tablet TAKE 1 TABLET (20 MG TOTAL) BY MOUTH AT BEDTIME. 90 tablet 3   gabapentin (NEURONTIN) 300 MG capsule TAKE 1 CAPSULE (300 MG TOTAL) BY MOUTH 2 (TWO) TIMES DAILY. (AM+BEDTIME) 180 capsule 0   glucose blood (TRUE METRIX BLOOD GLUCOSE TEST) test strip Use as instructed 100 each 12   insulin glargine (LANTUS SOLOSTAR) 100 UNIT/ML Solostar Pen Inject 5 Units into the skin at bedtime. 15 mL 11   Insulin Pen Needle (PEN NEEDLES) 31G X 8 MM MISC UAD 100 each 6   KERENDIA 10 MG TABS Take 1 tablet by mouth daily.     mupirocin ointment (BACTROBAN) 2 % Apply 1 Application topically 2 (two) times daily. 30 g 2   pravastatin (PRAVACHOL) 20 MG tablet TAKE 1 TABLET (20 MG TOTAL) BY MOUTH DAILY. (BEDTIME) 90 tablet 0   valsartan (DIOVAN) 40 MG tablet TAKE 1 TABLET (40 MG TOTAL) BY MOUTH DAILY. (AM) 90 tablet 1   No current facility-administered medications for this visit.   No Known Allergies   Review of Systems: All systems reviewed and negative except where noted in HPI.     Physical Exam:    Wt Readings from Last 3 Encounters:  02/22/22 246 lb (111.6 kg)  11/08/21 233 lb 3.2 oz (105.8 kg)  10/23/21 231 lb 3.2 oz (104.9 kg)    There were no vitals taken for this visit. Constitutional:  Pleasant,  in no acute distress. Cardiovascular: Normal rate, regular rhythm. No edema Pulmonary/chest: Effort normal and breath sounds normal. No wheezing, rales or rhonchi. Abdominal: Soft, nondistended, nontender. Bowel sounds active throughout. There are no masses palpable. No hepatomegaly.   ASSESSMENT AND PLAN;   1) Nausea/Vomiting 2) Abdominal bloating Unclear etiology, but discussed DDx to include gastroparesis (mildly reduced gastric emptying on previous GES) atypical reflux, medication ADR effect, etc. with plan for the following:  - EGD to evaluate for mucosal/luminal pathology to include GOO, PUD, H. pylori, etc. - Will hold off on making medication changes endoscopic findings first.  If EGD otherwise unrevealing, may consider trial of high-dose PPI for diagnostic and therapeutic intent - May consider trial of prophylactic antiemetics at bedtime, pending endoscopic findings  3) ESRD 4) Obesity 5) Diabetes 6) Right-sided hemiplegia s/p previous C-spine surgery - Procedure to be done at [facility] due to elevated periprocedural risks  The indications, risks, and benefits of EGD were explained to the patient in detail. Risks include but are not limited to bleeding, perforation, adverse reaction to medications, and cardiopulmonary compromise. Sequelae include but are not limited to the possibility of surgery, hospitalization, and mortality. The patient verbalized understanding and wished to proceed. All questions answered, referred to scheduler. Further recommendations pending results of the exam.     Lavena Bullion, DO, FACG  05/15/2022, 2:18 PM   Ladell Pier, MD

## 2022-05-17 ENCOUNTER — Encounter: Payer: Medicaid Other | Admitting: Podiatry

## 2022-05-21 ENCOUNTER — Other Ambulatory Visit: Payer: Self-pay | Admitting: Internal Medicine

## 2022-05-21 DIAGNOSIS — I152 Hypertension secondary to endocrine disorders: Secondary | ICD-10-CM

## 2022-05-22 ENCOUNTER — Encounter: Payer: Medicaid Other | Admitting: Podiatry

## 2022-05-28 LAB — LAB REPORT - SCANNED: EGFR: 16

## 2022-06-04 ENCOUNTER — Encounter (HOSPITAL_COMMUNITY): Payer: Self-pay | Admitting: Gastroenterology

## 2022-06-11 ENCOUNTER — Ambulatory Visit (HOSPITAL_COMMUNITY)
Admission: RE | Admit: 2022-06-11 | Discharge: 2022-06-11 | Disposition: A | Discharge status: Home / Self Care | Payer: Medicaid Other | Attending: Gastroenterology | Admitting: Gastroenterology

## 2022-06-11 ENCOUNTER — Ambulatory Visit (HOSPITAL_BASED_OUTPATIENT_CLINIC_OR_DEPARTMENT_OTHER): Payer: Medicaid Other | Admitting: Certified Registered Nurse Anesthetist

## 2022-06-11 ENCOUNTER — Encounter (HOSPITAL_COMMUNITY): Payer: Self-pay | Admitting: Gastroenterology

## 2022-06-11 ENCOUNTER — Other Ambulatory Visit: Payer: Self-pay

## 2022-06-11 ENCOUNTER — Encounter (HOSPITAL_COMMUNITY)
Admission: RE | Disposition: A | Discharge status: Home / Self Care | Payer: Self-pay | Source: Home / Self Care | Attending: Gastroenterology

## 2022-06-11 ENCOUNTER — Ambulatory Visit (HOSPITAL_COMMUNITY): Payer: Medicaid Other | Admitting: Certified Registered Nurse Anesthetist

## 2022-06-11 DIAGNOSIS — Q398 Other congenital malformations of esophagus: Secondary | ICD-10-CM

## 2022-06-11 DIAGNOSIS — Q402 Other specified congenital malformations of stomach: Secondary | ICD-10-CM

## 2022-06-11 DIAGNOSIS — E1142 Type 2 diabetes mellitus with diabetic polyneuropathy: Secondary | ICD-10-CM | POA: Diagnosis not present

## 2022-06-11 DIAGNOSIS — N186 End stage renal disease: Secondary | ICD-10-CM | POA: Insufficient documentation

## 2022-06-11 DIAGNOSIS — B9681 Helicobacter pylori [H. pylori] as the cause of diseases classified elsewhere: Secondary | ICD-10-CM | POA: Diagnosis not present

## 2022-06-11 DIAGNOSIS — F319 Bipolar disorder, unspecified: Secondary | ICD-10-CM | POA: Diagnosis not present

## 2022-06-11 DIAGNOSIS — Z7984 Long term (current) use of oral hypoglycemic drugs: Secondary | ICD-10-CM

## 2022-06-11 DIAGNOSIS — Z794 Long term (current) use of insulin: Secondary | ICD-10-CM | POA: Insufficient documentation

## 2022-06-11 DIAGNOSIS — E1122 Type 2 diabetes mellitus with diabetic chronic kidney disease: Secondary | ICD-10-CM | POA: Diagnosis not present

## 2022-06-11 DIAGNOSIS — K2970 Gastritis, unspecified, without bleeding: Secondary | ICD-10-CM

## 2022-06-11 DIAGNOSIS — Z79899 Other long term (current) drug therapy: Secondary | ICD-10-CM | POA: Insufficient documentation

## 2022-06-11 DIAGNOSIS — Z800 Family history of malignant neoplasm of digestive organs: Secondary | ICD-10-CM | POA: Diagnosis not present

## 2022-06-11 DIAGNOSIS — J45909 Unspecified asthma, uncomplicated: Secondary | ICD-10-CM | POA: Insufficient documentation

## 2022-06-11 DIAGNOSIS — I120 Hypertensive chronic kidney disease with stage 5 chronic kidney disease or end stage renal disease: Secondary | ICD-10-CM | POA: Diagnosis not present

## 2022-06-11 DIAGNOSIS — R112 Nausea with vomiting, unspecified: Secondary | ICD-10-CM | POA: Diagnosis not present

## 2022-06-11 DIAGNOSIS — R140 Abdominal distension (gaseous): Secondary | ICD-10-CM

## 2022-06-11 DIAGNOSIS — I10 Essential (primary) hypertension: Secondary | ICD-10-CM

## 2022-06-11 HISTORY — PX: BIOPSY: SHX5522

## 2022-06-11 HISTORY — PX: ESOPHAGOGASTRODUODENOSCOPY (EGD) WITH PROPOFOL: SHX5813

## 2022-06-11 LAB — GLUCOSE, CAPILLARY: Glucose-Capillary: 177 mg/dL — ABNORMAL HIGH (ref 70–99)

## 2022-06-11 SURGERY — ESOPHAGOGASTRODUODENOSCOPY (EGD) WITH PROPOFOL
Anesthesia: General

## 2022-06-11 MED ORDER — DEXAMETHASONE SODIUM PHOSPHATE 10 MG/ML IJ SOLN
INTRAMUSCULAR | Status: DC | PRN
  Administered 2022-06-11: 8 mg via INTRAVENOUS

## 2022-06-11 MED ORDER — ONDANSETRON HCL 4 MG/2ML IJ SOLN
INTRAMUSCULAR | Status: DC | PRN
  Administered 2022-06-11: 4 mg via INTRAVENOUS

## 2022-06-11 MED ORDER — MIDAZOLAM HCL 2 MG/2ML IJ SOLN
INTRAMUSCULAR | Status: AC
  Filled 2022-06-11: qty 2

## 2022-06-11 MED ORDER — MIDAZOLAM HCL 5 MG/5ML IJ SOLN
INTRAMUSCULAR | Status: DC | PRN
  Administered 2022-06-11: 2 mg via INTRAVENOUS

## 2022-06-11 MED ORDER — FENTANYL CITRATE (PF) 250 MCG/5ML IJ SOLN
INTRAMUSCULAR | Status: DC | PRN
  Administered 2022-06-11: 100 ug via INTRAVENOUS

## 2022-06-11 MED ORDER — FENTANYL CITRATE (PF) 100 MCG/2ML IJ SOLN
INTRAMUSCULAR | Status: AC
  Filled 2022-06-11: qty 2

## 2022-06-11 MED ORDER — SODIUM CHLORIDE 0.9 % IV SOLN: INTRAVENOUS | Status: DC

## 2022-06-11 MED ORDER — PANTOPRAZOLE SODIUM 40 MG PO TBEC: 40.0000 mg | DELAYED_RELEASE_TABLET | Freq: Two times a day (BID) | ORAL | 1 refills | Status: DC

## 2022-06-11 MED ORDER — SUCCINYLCHOLINE CHLORIDE 200 MG/10ML IV SOSY
PREFILLED_SYRINGE | INTRAVENOUS | Status: DC | PRN
  Administered 2022-06-11: 120 mg via INTRAVENOUS

## 2022-06-11 MED ORDER — LIDOCAINE 2% (20 MG/ML) 5 ML SYRINGE
INTRAMUSCULAR | Status: DC | PRN
  Administered 2022-06-11: 100 mg via INTRAVENOUS

## 2022-06-11 MED ORDER — PROPOFOL 10 MG/ML IV BOLUS
INTRAVENOUS | Status: AC
  Filled 2022-06-11: qty 20

## 2022-06-11 MED ORDER — LACTATED RINGERS IV SOLN
INTRAVENOUS | Status: AC | PRN
  Administered 2022-06-11: 1000 mL via INTRAVENOUS

## 2022-06-11 MED ORDER — PROPOFOL 10 MG/ML IV BOLUS
INTRAVENOUS | Status: DC | PRN
  Administered 2022-06-11: 250 mg via INTRAVENOUS

## 2022-06-11 SURGICAL SUPPLY — 15 items

## 2022-06-11 NOTE — Anesthesia Procedure Notes (Addendum)
Procedure Name: Intubation Date/Time: 06/11/2022 10:39 AM  Performed by: Jenne Campus, CRNAPre-anesthesia Checklist: Patient identified, Emergency Drugs available, Suction available and Patient being monitored Patient Re-evaluated:Patient Re-evaluated prior to induction Oxygen Delivery Method: Circle System Utilized Preoxygenation: Pre-oxygenation with 100% oxygen Induction Type: IV induction, Rapid sequence and Cricoid Pressure applied Laryngoscope Size: Glidescope and 4 Grade View: Grade I Tube type: Oral Tube size: 7.5 mm Number of attempts: 1 Airway Equipment and Method: Stylet and Oral airway Placement Confirmation: ETT inserted through vocal cords under direct vision, positive ETCO2 and breath sounds checked- equal and bilateral Secured at: 23 cm Tube secured with: Tape Dental Injury: Teeth and Oropharynx as per pre-operative assessment  Difficulty Due To: Difficulty was anticipated and Difficult Airway- due to reduced neck mobility Future Recommendations: Recommend- induction with short-acting agent, and alternative techniques readily available Comments: Patient had posterior cervical fusion.

## 2022-06-11 NOTE — Anesthesia Postprocedure Evaluation (Signed)
Anesthesia Post Note  Patient: [name] [name]  Procedure(s) Performed: ESOPHAGOGASTRODUODENOSCOPY (EGD) WITH PROPOFOL BIOPSY     Patient location during evaluation: PACU Anesthesia Type: General Level of consciousness: awake and alert and oriented Pain management: pain level controlled Vital Signs Assessment: post-procedure vital signs reviewed and stable Respiratory status: spontaneous breathing, nonlabored ventilation and respiratory function stable Cardiovascular status: blood pressure returned to baseline and stable Postop Assessment: no apparent nausea or vomiting Anesthetic complications: yes   Encounter Notable Events  Notable Event Outcome Phase Comment  Difficult to intubate - expected  Intraprocedure Filed from anesthesia note documentation.    Last Vitals:  Vitals:   06/11/22 1110 06/11/22 1120  BP: (!) 166/103 (!) 175/98  Pulse: 75 76  Resp: 14 16  Temp: 36.8 C   SpO2: 100% 97%    Last Pain:  Vitals:   06/11/22 1120  TempSrc:   PainSc: 0-No pain                 [name] [name] A.

## 2022-06-11 NOTE — Transfer of Care (Signed)
Immediate Anesthesia Transfer of Care Note  Patient: [name] [name]  Procedure(s) Performed: ESOPHAGOGASTRODUODENOSCOPY (EGD) WITH PROPOFOL BIOPSY  Patient Location: Endoscopy Unit  Anesthesia Type:General  Level of Consciousness: oriented, drowsy, and patient cooperative  Airway & Oxygen Therapy: Patient Spontanous Breathing and Patient connected to face mask oxygen  Post-op Assessment: Report given to RN and Post -op Vital signs reviewed and stable  Post vital signs: Reviewed  Last Vitals:  Vitals Value Taken Time  BP    Temp    Pulse    Resp    SpO2      Last Pain:  Vitals:   06/11/22 0928  TempSrc: Tympanic  PainSc: 0-No pain         Complications:  Encounter Notable Events  Notable Event Outcome Phase Comment  Difficult to intubate - expected  Intraprocedure Filed from anesthesia note documentation.

## 2022-06-11 NOTE — Op Note (Signed)
[facility] Patient Name: [name] [name] Procedure Date: 06/11/2022 MRN: [id number] Attending MD: Gerrit Heck , MD, [id number] Date of Birth: May 01, 1985 CSN: [id number] Age: 37 Admit Type: Outpatient Procedure:                Upper GI endoscopy Indications:              Abdominal bloating, Nausea with vomiting Providers:                Gerrit Heck, MD, Altamese Cabal, RN, Cherylynn Ridges, Technician, Luciana Axe, CRNA Referring MD:              Medicines:                Elective intubation and Monitored Anesthesia Care Complications:            No immediate complications. Estimated Blood Loss:     Estimated blood loss was minimal. Procedure:                Pre-Anesthesia Assessment:                           - Prior to the procedure, a History and Physical                            was performed, and patient medications and                            allergies were reviewed. The patient's tolerance of                            previous anesthesia was also reviewed. The risks                            and benefits of the procedure and the sedation                            options and risks were discussed with the patient.                            All questions were answered, and informed consent                            was obtained. Prior Anticoagulants: The patient has                            taken no anticoagulant or antiplatelet agents. ASA                            Grade Assessment: III - A patient with severe                            systemic disease. After reviewing the risks and  benefits, the patient was deemed in satisfactory                            condition to undergo the procedure.                           After obtaining informed consent, the endoscope was                            passed under direct vision. Throughout the                            procedure, the patient's  blood pressure, pulse, and                            oxygen saturations were monitored continuously. The                            [id number] Olympus endoscope was introduced                            through the mouth, and advanced to the third part                            of duodenum. The upper GI endoscopy was                            accomplished without difficulty. The patient                            tolerated the procedure well. Scope In: Scope Out: Findings:      A single area of ectopic gastric mucosa was found in the upper third of       the esophagus, 19 cm from the incisors.      The entire esophagus was otherwise normal.      The Z-line was regular and was found 44 cm from the incisors.      Food (residue) was found in the gastric fundus.      Diffuse mild inflammation characterized by congestion (edema) was found       in the entire examined stomach. Biopsies were taken with a cold forceps       for histology and Helicobacter pylori testing. Estimated blood loss was       minimal. The pylorus was patent and easily traversed.      The examined duodenum was normal. Biopsies were taken with a cold       forceps for histology. Estimated blood loss was minimal. Impression:               - Ectopic gastric mucosa in the upper third of the                            esophagus.                           - Normal esophagus.                           -  Z-line regular, 44 cm from the incisors.                           - Residual food in the gastric fundus.                           - Gastritis. Biopsied.                           - Normal examined duodenum. Biopsied. Moderate Sedation:      Not Applicable - Patient had care per Anesthesia. Recommendation:           - Patient has a contact number available for                            emergencies. The signs and symptoms of potential                            delayed complications were discussed with the                             patient. Return to normal activities tomorrow.                            Written discharge instructions were provided to the                            patient.                           - Resume previous diet.                           - Continue present medications.                           - Await pathology results.                           - Will trial course of Protonix 40 mg PO BID x4                            weeks for treatment of gastritis and therapeutic                            intent. After 4 weeks, reduce to 40 mg daily, and                            if no clinical improvement, will likely discontinue.                           - Return to GI clinic at appointment to be                            scheduled. Procedure Code(s):        ---  Professional ---                           [phone], Esophagogastroduodenoscopy, flexible,                            transoral; with biopsy, single or multiple Diagnosis Code(s):        --- Professional ---                           Q40.2, Other specified congenital malformations of                            stomach                           K29.70, Gastritis, unspecified, without bleeding                           R14.0, Abdominal distension (gaseous)                           R11.2, Nausea with vomiting, unspecified CPT copyright 2022 [employer]. All rights reserved. The codes documented in this report are preliminary and upon coder review may  be revised to meet current compliance requirements. Gerrit Heck, MD 06/11/2022 11:00:30 AM Number of Addenda: 0

## 2022-06-11 NOTE — Anesthesia Preprocedure Evaluation (Addendum)
Anesthesia Evaluation  Patient identified by MRN, date of birth, ID band Patient awake    Reviewed: Allergy & Precautions, NPO status , Patient's Chart, lab work & pertinent test results  Airway Mallampati: II  TM Distance: >3 FB Neck ROM: Limited    Dental no notable dental hx. (+) Teeth Intact, Dental Advisory Given   Pulmonary asthma    Pulmonary exam normal breath sounds clear to auscultation       Cardiovascular hypertension, Pt. on medications Normal cardiovascular exam+ Valvular Problems/Murmurs  Rhythm:Regular Rate:Normal     Neuro/Psych  PSYCHIATRIC DISORDERS  Depression Bipolar Disorder   Diabetic peripheral neuropathy    GI/Hepatic Neg liver ROS,,,Nausea, vomiting and bloating Hx/o Gastroparesis   Endo/Other  diabetes, Poorly Controlled, Type 2, Oral Hypoglycemic Agents  Morbid obesity  Renal/GU Renal disease  negative genitourinary   Musculoskeletal  (+) Arthritis , Osteoarthritis,    Abdominal  (+) + obese  Peds  Hematology  (+) Blood dyscrasia, anemia   Anesthesia Other Findings   Reproductive/Obstetrics                              Anesthesia Physical Anesthesia Plan  ASA: 3  Anesthesia Plan: General   Post-op Pain Management: Minimal or no pain anticipated   Induction: Intravenous, Rapid sequence and Cricoid pressure planned  PONV Risk Score and Plan: 2 and Treatment may vary due to age or medical condition and Ondansetron  Airway Management Planned: Oral ETT  Additional Equipment: None  Intra-op Plan:   Post-operative Plan: Extubation in OR  Informed Consent: I have reviewed the patients History and Physical, chart, labs and discussed the procedure including the risks, benefits and alternatives for the proposed anesthesia with the patient or authorized representative who has indicated his/her understanding and acceptance.     Dental advisory given  Plan  Discussed with: CRNA and Anesthesiologist  Anesthesia Plan Comments:          Anesthesia Quick Evaluation

## 2022-06-11 NOTE — Interval H&P Note (Signed)
History and Physical Interval Note:  06/11/2022 9:47 AM  Wilmer Floor  has presented today for surgery, with the diagnosis of nausea, vomiting, bloating.  The various methods of treatment have been discussed with the patient and family. After consideration of risks, benefits and other options for treatment, the patient has consented to  Procedure(s): ESOPHAGOGASTRODUODENOSCOPY (EGD) WITH PROPOFOL (N/A) as a surgical intervention.  The patient's history has been reviewed, patient examined, no change in status, stable for surgery.  I have reviewed the patient's chart and labs.  Questions were answered to the patient's satisfaction.     Dominic Pea Jefry Lesinski

## 2022-06-12 LAB — SURGICAL PATHOLOGY

## 2022-06-13 ENCOUNTER — Encounter (HOSPITAL_COMMUNITY): Payer: Self-pay | Admitting: Gastroenterology

## 2022-06-19 ENCOUNTER — Ambulatory Visit (INDEPENDENT_AMBULATORY_CARE_PROVIDER_SITE_OTHER): Payer: Medicaid Other | Admitting: Mental Health

## 2022-06-19 DIAGNOSIS — F332 Major depressive disorder, recurrent severe without psychotic features: Secondary | ICD-10-CM | POA: Diagnosis not present

## 2022-06-19 DIAGNOSIS — F1290 Cannabis use, unspecified, uncomplicated: Secondary | ICD-10-CM

## 2022-06-21 DIAGNOSIS — F1290 Cannabis use, unspecified, uncomplicated: Secondary | ICD-10-CM | POA: Insufficient documentation

## 2022-06-21 DIAGNOSIS — F332 Major depressive disorder, recurrent severe without psychotic features: Secondary | ICD-10-CM | POA: Insufficient documentation
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
## 2022-06-25 ENCOUNTER — Encounter: Payer: Self-pay | Admitting: Internal Medicine

## 2022-06-25 ENCOUNTER — Telehealth: Payer: Self-pay | Admitting: Internal Medicine

## 2022-06-25 ENCOUNTER — Ambulatory Visit: Payer: Medicaid Other | Attending: Internal Medicine | Admitting: Internal Medicine

## 2022-06-25 VITALS — BP 146/77 | HR 69 | Temp 98.7°F | Ht 66.0 in | Wt 254.0 lb

## 2022-06-25 DIAGNOSIS — F321 Major depressive disorder, single episode, moderate: Secondary | ICD-10-CM

## 2022-06-25 DIAGNOSIS — R42 Dizziness and giddiness: Secondary | ICD-10-CM

## 2022-06-25 DIAGNOSIS — D638 Anemia in other chronic diseases classified elsewhere: Secondary | ICD-10-CM | POA: Diagnosis not present

## 2022-06-25 DIAGNOSIS — I152 Hypertension secondary to endocrine disorders: Secondary | ICD-10-CM

## 2022-06-25 DIAGNOSIS — E785 Hyperlipidemia, unspecified: Secondary | ICD-10-CM

## 2022-06-25 DIAGNOSIS — E1142 Type 2 diabetes mellitus with diabetic polyneuropathy: Secondary | ICD-10-CM | POA: Diagnosis not present

## 2022-06-25 DIAGNOSIS — E1159 Type 2 diabetes mellitus with other circulatory complications: Secondary | ICD-10-CM

## 2022-06-25 DIAGNOSIS — Z89429 Acquired absence of other toe(s), unspecified side: Secondary | ICD-10-CM | POA: Insufficient documentation

## 2022-06-25 DIAGNOSIS — Z6841 Body Mass Index (BMI) 40.0 and over, adult: Secondary | ICD-10-CM

## 2022-06-25 DIAGNOSIS — E1122 Type 2 diabetes mellitus with diabetic chronic kidney disease: Secondary | ICD-10-CM

## 2022-06-25 DIAGNOSIS — E1169 Type 2 diabetes mellitus with other specified complication: Secondary | ICD-10-CM

## 2022-06-25 DIAGNOSIS — R0609 Other forms of dyspnea: Secondary | ICD-10-CM

## 2022-06-25 DIAGNOSIS — N184 Chronic kidney disease, stage 4 (severe): Secondary | ICD-10-CM

## 2022-06-25 LAB — GLUCOSE, POCT (MANUAL RESULT ENTRY): POC Glucose: 128 mg/dl — AB (ref 70–99)

## 2022-06-25 LAB — POCT GLYCOSYLATED HEMOGLOBIN (HGB A1C): HbA1c, POC (controlled diabetic range): 6.9 % (ref 0.0–7.0)

## 2022-06-25 MED ORDER — GABAPENTIN 300 MG PO CAPS: ORAL_CAPSULE | ORAL | 1 refills | Status: DC

## 2022-06-25 MED ORDER — PRAVASTATIN SODIUM 20 MG PO TABS: 20.0000 mg | ORAL_TABLET | Freq: Every day | ORAL | 1 refills | Status: DC

## 2022-06-25 MED ORDER — AMLODIPINE BESYLATE 10 MG PO TABS: ORAL_TABLET | ORAL | 1 refills | Status: DC

## 2022-06-25 NOTE — Progress Notes (Signed)
Patient ID: [name] [name], male    DOB: 09/11/1985  MRN: [id number]  CC: Diabetes (DM & HTN f/u. Med refills. /Episodes of sudden & quick fatigue & weaknes X3-4 weeks, dizziness, lightheaded, feelings of legs "turning into jell-o" /Intermittent pain on R shoulder / chest )   Subjective: [name] [name] is a 37 y.o. 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   Patient gets medication blister packs through [facility].  He did not bring his blister pack with him today.  Not sure of what he is taking.  DM: Results for orders placed or performed in visit on 06/25/22  POCT glucose (manual entry)  Result Value Ref Range   POC Glucose 128 (A) 70 - 99 mg/dl  POCT glycosylated hemoglobin (Hb A1C)  Result Value Ref Range   Hemoglobin A1C     HbA1c POC (<> result, manual entry)     HbA1c, POC (prediabetic range)     HbA1c, POC (controlled diabetic range) 6.9 0.0 - 7.0 %  He is not sure whether he is still on Jardiance or not.  He thinks the nephrologist Dr. Valentino Nose stopped it on last visit 1 month ago.   Has Dexcom  but not wearing the sensor. Has RF at pharmacy Reports that he eats only 1 meal a day.  Has a roommate who does the cooking. Wgh up 8 lbs since I saw him 02/2022  HTN/CKD 4: Should be on carvedilol 25 mg twice a day, chlorthalidone 25 mg half a tablet daily, amlodipine 10 mg daily, Diovan 40 mg daily and Kerendia 10 mg daily.  Did not bring his blister pk with him today. Saw neph 05/28/2022.  Reports being told to stop Coreg?? He is not sure and thinks it is still being placed in his blister pack. Limits salt  HL: On  pravastatin 20 mg daily.  Thinks he is still taking it.  C/o episodes of sudden & quick fatigue & weaknes X3-4 weeks, dizziness, lightheaded, feelings of legs "turning into jell-o" Just standing up makes him tired Endorses SOB easily.  No LE edema.  No PND/orthopnea; sleeps on 3-4 pillows. He is known chronic anemia with hemoglobin around 10.  Depression: Positive depression screen today.  Just got plugged in with a therapist.  He is not interested in any medication for depression.  No suicidal ideation.  Patient Active Problem List   Diagnosis Date Noted   History of amputation of toe 06/25/2022   CKD stage 4 due to type 2 diabetes mellitus 06/25/2022   Moderately severe recurrent major depression 06/21/2022   Cannabis use disorder 06/21/2022   Nausea and vomiting 06/11/2022   Bloating 06/11/2022   Gastritis and gastroduodenitis 06/11/2022   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   Chest pain of uncertain etiology 11/08/2021   Gastroparesis 04/17/2021   Abnormality of gait 10/11/2020   Macroalbuminuric diabetic nephropathy 08/10/2020   Hemiparesis of right dominant side due to non-cerebrovascular etiology 08/10/2020   Cellulitis, leg 03/17/2020   Cellulitis of right leg 03/17/2020   Uncontrolled type 2 diabetes mellitus with complication, with long-term current use of insulin    Abscess    Cellulitis 12/05/2019  Cellulitis of left lower extremity    Abscess of left lower extremity    Uncontrolled type 2 diabetes mellitus with hyperglycemia    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 2 diabetes mellitus    Cellulitis of foot, left 11/22/2016   Diabetic neuropathy 11/22/2016   Fracture of 2nd metatarsal 11/22/2016   Fracture of 3rd metatarsal 11/22/2016   Postprocedural non-healing wound 11/22/2016   Acute cystitis with positive culture    Cutaneous abscess of left foot    Poorly controlled type 2 diabetes mellitus with complication 10/27/2016   Hyperlipidemia 10/17/2016   Depression  10/17/2016   Neck pain    Asthma 04/11/2016   Uncontrolled type 2 diabetes mellitus with complication    Obesity, morbid 09/06/2015   Hypertension associated with diabetes 09/06/2015     Current Outpatient Medications on File Prior to Visit  Medication Sig Dispense Refill   acetaminophen (TYLENOL) 325 MG tablet Take 2 tablets (650 mg total) by mouth every 6 (six) hours as needed for mild pain or fever (for pain). 10 tablet 0   chlorthalidone (HYGROTON) 25 MG tablet Take 12.5 mg by mouth daily.     Continuous Blood Gluc Receiver (DEXCOM G7 RECEIVER) DEVI Use to check blood sugar TID. E11.42 1 each 0   Continuous Blood Gluc Sensor (DEXCOM G7 SENSOR) MISC Use to check blood sugar TID. E11.42 3 each 3   Continuous Blood Gluc Transmit (DEXCOM G6 TRANSMITTER) MISC USE AS DIRECTED DAILY 1 each 0   empagliflozin (JARDIANCE) 10 MG TABS tablet 1 tab PO Q Mon/Wed/Frid.  Stop Metformin. (Patient taking differently: Take 10 mg by mouth in the morning.) 12 tablet 2   famotidine (PEPCID) 20 MG tablet TAKE 1 TABLET (20 MG TOTAL) BY MOUTH AT BEDTIME. 90 tablet 3   KERENDIA 10 MG TABS Take 10 mg by mouth in the morning.     pantoprazole (PROTONIX) 40 MG tablet Take 1 tablet (40 mg total) by mouth 2 (two) times daily. 90 tablet 1   valsartan (DIOVAN) 40 MG tablet TAKE 1 TABLET (40 MG TOTAL) BY MOUTH DAILY. (AM) 90 tablet 1   carvedilol (COREG) 25 MG tablet Take 1 tablet (25 mg total) by mouth 2 (two) times daily with a meal. (Patient not taking: Reported on 06/07/2022) 60 tablet 5   No current facility-administered medications on file prior to visit.    Not on File  Social History   Socioeconomic History   Marital status: Single    Spouse name: Not on file   Number of children: 2   Years of education: Not on file   Highest education level: Not on file  Occupational History   Occupation: disabled  Tobacco Use   Smoking status: Never   Smokeless tobacco: Never  Vaping Use   Vaping Use: Some days    Substances: THC   Devices: THC  Substance and Sexual Activity   Alcohol use: Yes    Comment: 11/06/2016 "I'll drink a little at parties; maybe 5 times/year"   Drug use: Yes    Types: Marijuana    Comment: 11/06/2016 "stopped ~ 1 month ago"   Sexual activity: Never    Birth control/protection: Diaphragm  Other Topics Concern   Not on file  Social History Narrative   Not on file   Social Determinants of Health   Financial Resource Strain: Low Risk  (06/19/2022)   Overall Financial Resource Strain (CARDIA)    Difficulty of Paying Living Expenses: Not very hard  Food Insecurity: Food Insecurity  Present (06/19/2022)   Hunger Vital Sign    Worried About Running Out of Food in the Last Year: Often true    Ran Out of Food in the Last Year: Often true  Transportation Needs: No Transportation Needs (06/19/2022)   PRAPARE - Administrator, Civil ServiceTransportation    Lack of Transportation (Medical): No    Lack of Transportation (Non-Medical): No  Physical Activity: Insufficiently Active (06/19/2022)   Exercise Vital Sign    Days of Exercise per Week: 5 days    Minutes of Exercise per Session: 10 min  Stress: Stress Concern Present (06/19/2022)   [employer] - Occupational Stress Questionnaire    Feeling of Stress : Very much  Social Connections: Socially Isolated (06/19/2022)   Social Connection and Isolation Panel [NHANES]    Frequency of Communication with Friends and Family: More than three times a week    Frequency of Social Gatherings with Friends and Family: More than three times a week    Attends Religious Services: Never    Database administratorActive Member of Clubs or Organizations: No    Attends BankerClub or Organization Meetings: Never    Marital Status: Divorced  Catering managerntimate Partner Violence: Not At Risk (06/19/2022)   Humiliation, Afraid, Rape, and Kick questionnaire    Fear of Current or Ex-Partner: No    Emotionally Abused: No    Physically Abused: No    Sexually Abused: No    Family History   Problem Relation Age of Onset   Diabetes type II Mother    Coronary artery disease Mother    Diabetes type II Father    Colon cancer Maternal Grandfather    Lung cancer Maternal Grandfather    Prostate cancer Maternal Grandfather    Diabetes Maternal Grandfather    Hypertension Maternal Grandfather    Heart disease Paternal Grandfather    Parkinson's disease Paternal Aunt    Multiple sclerosis Paternal Aunt     Past Surgical History:  Procedure Laterality Date   AMPUTATION Left 11/05/2016   Procedure: AMPUTATION LEFT GREAT TOE;  Surgeon: Nadara Mustarduda, Marcus V, MD;  Location: [facility];  Service: Orthopedics;  Laterality: Left;   BIOPSY  06/11/2022   Procedure: BIOPSY;  Surgeon: Shellia Cleverlyirigliano, Vito V, DO;  Location: [facility];  Service: Gastroenterology;;   CATARACT EXTRACTION Left 07/2020   DRESSING CHANGE UNDER ANESTHESIA N/A 04/13/2016   Procedure: DRESSING CHANGE UNDER ANESTHESIA;  Surgeon: Luretha MurphyMatthew Martin, MD;  Location: [facility];  Service: General;  Laterality: N/A;   ESOPHAGOGASTRODUODENOSCOPY (EGD) WITH PROPOFOL N/A 06/11/2022   Procedure: ESOPHAGOGASTRODUODENOSCOPY (EGD) WITH PROPOFOL;  Surgeon: Shellia Cleverlyirigliano, Vito V, DO;  Location: [facility];  Service: Gastroenterology;  Laterality: N/A;   INCISION AND DRAINAGE ABSCESS Right 09/03/2015   Procedure: INCISION AND DRAINAGE ABSCESS;  Surgeon: Dominica SeverinWilliam Gramig, MD;  Location: [facility];  Service: Orthopedics;  Laterality: Right;   INCISION AND DRAINAGE PERIRECTAL ABSCESS N/A 04/11/2016   Procedure: IRRIGATION AND DEBRIDEMENT RECTAL ABSCESS;  Surgeon: Luretha MurphyMatthew Martin, MD;  Location: [facility];  Service: General;  Laterality: N/A;   INGUINAL HERNIA REPAIR  1988; 1989   x2    IRRIGATION AND DEBRIDEMENT ABSCESS Left 08/31/2015   Procedure: IRRIGATION AND DEBRIDEMENT ABSCESS LEFT SHOULDER;  Surgeon: Ovidio Kinavid Newman, MD;  Location: [facility];  Service: General;  Laterality: Left;   RETINAL LASER PROCEDURE Bilateral    TUMOR EXCISION     neck   WOUND DEBRIDEMENT  N/A 02/24/2016   Procedure: SACRAL WOUND DEBRIDEMENT;  Surgeon: Violeta GelinasBurke Thompson, MD;  Location: [facility];  Service: General;  Laterality: N/A;    ROS: Review of Systems Negative except as stated above  PHYSICAL EXAM: BP (!) 146/77 (BP Location: Left Arm, Patient Position: Sitting, Cuff Size: Large)   Pulse 69   Temp 98.7 F (37.1 C) (Oral)   Ht 5\' 6"  (1.676 m)   Wt 254 lb (115.2 kg)   SpO2 98%   BMI 41.00 kg/m   Wt Readings from Last 3 Encounters:  06/25/22 254 lb (115.2 kg)  06/11/22 253 lb 4.9 oz (114.9 kg)  05/15/22 253 lb 6 oz (114.9 kg)  Sitting: BP 153/88, pulse 67 Standing: BP 148/74, pulse of 70  Physical Exam   General appearance - alert, well appearing, obese middle-age Caucasian male and in no distress Mental status - normal mood, behavior, speech, dress, motor activity, and thought processes Eyes -slightly pale conjunctiva Mouth -oral mucosa is moist Neck - supple, no significant adenopathy Chest - clear to auscultation, no wheezes, rales or rhonchi, symmetric air entry Heart - normal rate, regular rhythm, normal S1, S2, no murmurs, rubs, clicks or gallops Neurological -he walks dragging the right leg.  Grip on the right is 3+/5, 5/5 on the left.  Power in the upper extremities 5/5 proximally and distally on the left, 4+/5 on the right. Extremities -no lower extremity edema.    06/25/2022    3:16 PM 06/19/2022    2:07 PM 02/22/2022    3:45 PM  Depression screen PHQ 2/9  Decreased Interest 1 2 1   Down, Depressed, Hopeless 1 3 1   PHQ - 2 Score 2 5 2   Altered sleeping 0 0 1  Tired, decreased energy 3 3 2   Change in appetite 1 2 1   Feeling bad or failure about yourself  2 2 1   Trouble concentrating 1 2 1   Moving slowly or fidgety/restless 2 2 2   Suicidal thoughts 1 1 1   PHQ-9 Score 12 17 11   Difficult doing work/chores  Not difficult at all        Latest Ref Rng & Units 04/17/2021    3:29 PM 03/21/2021    3:36 PM 11/24/2020    1:58 PM  CMP  Glucose 70 - 99  mg/dL 638  177  116   BUN 6 - 20 mg/dL 37  34  35   Creatinine 0.76 - 1.27 mg/dL 5.79  0.38  3.33   Sodium 134 - 144 mmol/L 143  142  141   Potassium 3.5 - 5.2 mmol/L 5.0  3.8  4.6   Chloride 96 - 106 mmol/L 108  106  105   CO2 20 - 29 mmol/L 23  23  22    Calcium 8.7 - 10.2 mg/dL 9.3  8.8  9.3   Total Protein 6.0 - 8.5 g/dL 6.5  6.0    Total Bilirubin 0.0 - 1.2 mg/dL <8.3  <2.9    Alkaline Phos 44 - 121 IU/L 85  91    AST 0 - 40 IU/L 19  13    ALT 0 - 44 IU/L 16  12     Lipid Panel     Component Value Date/Time   CHOL 149 03/21/2021 1536   TRIG 104 03/21/2021 1536   HDL 37 (L) 03/21/2021 1536   CHOLHDL 4.0 03/21/2021 1536   CHOLHDL 4.6 12/06/2019 0749   VLDL 23 12/06/2019 0749   LDLCALC 93 03/21/2021 1536    CBC    Component Value Date/Time   WBC 10.3 03/21/2021 1536   WBC 10.2 07/19/2020  1413   RBC 3.54 (L) 03/21/2021 1536   RBC 4.13 (L) 07/19/2020 1413   HGB 10.5 (L) 03/21/2021 1536   HCT 30.0 (L) 03/21/2021 1536   PLT 308 03/21/2021 1536   MCV 85 03/21/2021 1536   MCH 29.7 03/21/2021 1536   MCH 28.8 07/19/2020 1413   MCHC 35.0 03/21/2021 1536   MCHC 35.1 07/19/2020 1413   RDW 13.0 03/21/2021 1536   LYMPHSABS 1.9 07/19/2020 1413   LYMPHSABS 1.7 01/27/2020 1209   MONOABS 0.5 07/19/2020 1413   EOSABS 0.2 07/19/2020 1413   EOSABS 0.1 01/27/2020 1209   BASOSABS 0.1 07/19/2020 1413   BASOSABS 0.1 01/27/2020 1209    ASSESSMENT AND PLAN: 1. Diabetic polyneuropathy associated with type 2 diabetes mellitus I will have my CMA call his nephrologist office to inquire which medicines he discontinued.  Patient thinks he stopped Jardiance on carvedilol. If the Jardiance was indeed stopped, advised patient that he would need to keep a close eye on the blood sugar with his Dexcom device.  If levels start to increase me would have to restart Lantus insulin. - gabapentin (NEURONTIN) 300 MG capsule; TAKE 1 CAPSULE (300 MG TOTAL) BY MOUTH 2 (TWO) TIMES DAILY. (AM+BEDTIME)   Dispense: 180 capsule; Refill: 1 - POCT glucose (manual entry) - POCT glycosylated hemoglobin (Hb A1C)  2. CKD stage 4 due to type 2 diabetes mellitus Followed by nephrology.  3. Morbid obesity with body mass index (BMI) of 40.0 to 44.9 in adult Discussed on encourage healthy eating habits.  4. Anemia, chronic disease - CBC - Iron, TIBC and Ferritin Panel  5. Dizziness Recheck CBC today.  Encouraged him to try to eat more than 1 meal a day.  He can try doing 3-4 small meals. - CBC  6. DOE (dyspnea on exertion) Could be due to deconditioning.  We will check BNP to screen for CHF. - Brain natriuretic peptide  7. Moderate major depression Plugged in with behavioral health.  Declines medication  8. Hyperlipidemia associated with type 2 diabetes mellitus - pravastatin (PRAVACHOL) 20 MG tablet; Take 1 tablet (20 mg total) by mouth at bedtime.  Dispense: 90 tablet; Refill: 1  9.  Hypertension associated with diabetes Will have my CMA call the nephrologist office to inquire what medications were stopped. Will have patient follow-up with the clinical pharmacist in 1 to 2 weeks for medication reconciliation and recheck of blood pressure.  No changes made today until I get clarification on what medicine was stopped or added by the nephrologist.   Patient was given the opportunity to ask questions.  Patient verbalized understanding of the plan and was able to repeat key elements of the plan.   This documentation was completed using Paediatric nurse.  Any transcriptional errors are unintentional.  Orders Placed This Encounter  Procedures   Brain natriuretic peptide   CBC   Iron, TIBC and Ferritin Panel   POCT glucose (manual entry)   POCT glycosylated hemoglobin (Hb A1C)     Requested Prescriptions   Signed Prescriptions Disp Refills   pravastatin (PRAVACHOL) 20 MG tablet 90 tablet 1    Sig: Take 1 tablet (20 mg total) by mouth at bedtime.   gabapentin  (NEURONTIN) 300 MG capsule 180 capsule 1    Sig: TAKE 1 CAPSULE (300 MG TOTAL) BY MOUTH 2 (TWO) TIMES DAILY. (AM+BEDTIME)   amLODipine (NORVASC) 10 MG tablet 90 tablet 1    Sig: TAKE ONE TABLET BY MOUTH ONCE DAILY (AM)  Return in about 2 months (around 08/25/2022) for Appt with [facility] in 2 wks for medication reconciliation and BP check.  Jonah Blue, MD, FACP

## 2022-06-25 NOTE — Patient Instructions (Addendum)
We will have you follow-up with our clinical pharmacist in about 2 weeks.  Please bring your blister pack with you to that visit.  We will check some baseline labs today including blood count for anemia and screen for congestive heart failure.  Try eating smaller but more frequent meals during the day.

## 2022-06-26 LAB — CBC
Hematocrit: 28.7 % — ABNORMAL LOW (ref 37.5–51.0)
Hemoglobin: 9.7 g/dL — ABNORMAL LOW (ref 13.0–17.7)
MCH: 30.3 pg (ref 26.6–33.0)
MCHC: 33.8 g/dL (ref 31.5–35.7)
MCV: 90 fL (ref 79–97)
Platelets: 258 10*3/uL (ref 150–450)
RBC: 3.2 x10E6/uL — ABNORMAL LOW (ref 4.14–5.80)
RDW: 13.7 % (ref 11.6–15.4)
WBC: 11.8 10*3/uL — ABNORMAL HIGH (ref 3.4–10.8)

## 2022-06-26 LAB — IRON,TIBC AND FERRITIN PANEL
Ferritin: 66 ng/mL (ref 30–400)
Iron Saturation: 17 % (ref 15–55)
Iron: 43 ug/dL (ref 38–169)
Total Iron Binding Capacity: 258 ug/dL (ref 250–450)
UIBC: 215 ug/dL (ref 111–343)

## 2022-06-26 LAB — BRAIN NATRIURETIC PEPTIDE: BNP: 164.5 pg/mL — ABNORMAL HIGH (ref 0.0–100.0)

## 2022-06-26 NOTE — Telephone Encounter (Signed)
Called Dr.Peeple's office and LVM to get a call back.

## 2022-06-27 ENCOUNTER — Telehealth: Payer: Self-pay | Admitting: Internal Medicine

## 2022-06-27 ENCOUNTER — Telehealth: Payer: Self-pay

## 2022-06-27 DIAGNOSIS — B9681 Helicobacter pylori [H. pylori] as the cause of diseases classified elsewhere: Secondary | ICD-10-CM

## 2022-06-27 MED ORDER — FERROUS SULFATE 325 (65 FE) MG PO TABS
325.0000 mg | ORAL_TABLET | Freq: Every day | ORAL | 1 refills | Status: DC
Start: 2022-06-27 — End: 2023-01-23

## 2022-06-27 MED ORDER — METRONIDAZOLE 250 MG PO TABS: 250.0000 mg | ORAL_TABLET | Freq: Four times a day (QID) | ORAL | 0 refills | Status: AC

## 2022-06-27 MED ORDER — DOXYCYCLINE HYCLATE 100 MG PO CAPS: 100.0000 mg | ORAL_CAPSULE | Freq: Two times a day (BID) | ORAL | 0 refills | Status: AC

## 2022-06-27 NOTE — Telephone Encounter (Signed)
Dr.Johnson was able to call Dr.Peeple's office and speak to a staff member on 06/27/2022. All inquiries were made. No further action needed at this time. Please refer to previous telephone encounter for further information.

## 2022-06-27 NOTE — Telephone Encounter (Signed)
-----   Message from [facility] V, DO sent at 06/27/2022  8:39 AM EDT ----- Results from the recent upper endoscopy reviewed and notable for the following: -The biopsies taken from your small intestine were normal and there was no evidence of Celiac Disease.  -The biopsies from the recent upper GI Endoscopy were notable for H. Pylori gastritis, and will plan on treating with quad therapy as below. Please confirm no medication allergies to the prescribed regimen.   1) continue pantoprazole (Protonix) as prescribed at the time of upper endoscopy 2) Pepto Bismol 2 tabs (262 mg each) 4 times a day x 14 d 3) Metronidazole 250 mg 4 times a day x 14 d 4) doxycycline 100 mg 2 times a day x 14 d  Please confirm no allergies to the above prescribed medications.  4 weeks after antibiotics are completed, check H. Pylori stool antigen via [employer] panel to confirm eradication   Dx: H. Pylori gastritis

## 2022-06-27 NOTE — Telephone Encounter (Signed)
Phone call placed to Dr. Burgess Estelle office today.  I spoke with CMA Amber.  I inquired about which medications that the nephrologist stopped on this patient when he was seen 05/28/2022.  She tells me that Chauncey Mann was discontinued.  This was not called into patient's pharmacy.  I request that she call it in to [facility] so that they know to take it out of his blister pack. I inquired about iron studies and hemoglobin level on  that visit.  Numbers were: Hb 9.8 Iron 51/ferritin 64/TIBC 242/percent saturation 21%. She states that patient was told to purchase iron over-the-counter and take 1 daily.  I will send a prescription to patient's pharmacy.  Results for orders placed or performed in visit on 06/25/22  Brain natriuretic peptide  Result Value Ref Range   BNP 164.5 (H) 0.0 - 100.0 pg/mL  CBC  Result Value Ref Range   WBC 11.8 (H) 3.4 - 10.8 x10E3/uL   RBC 3.20 (L) 4.14 - 5.80 x10E6/uL   Hemoglobin 9.7 (L) 13.0 - 17.7 g/dL   Hematocrit 71.2 (L) 19.7 - 51.0 %   MCV 90 79 - 97 fL   MCH 30.3 26.6 - 33.0 pg   MCHC 33.8 31.5 - 35.7 g/dL   RDW 58.8 32.5 - 49.8 %   Platelets 258 150 - 450 x10E3/uL  Iron, TIBC and Ferritin Panel  Result Value Ref Range   Total Iron Binding Capacity 258 250 - 450 ug/dL   UIBC 264 158 - 309 ug/dL   Iron 43 38 - 407 ug/dL   Iron Saturation 17 15 - 55 %   Ferritin 66 30 - 400 ng/mL  POCT glucose (manual entry)  Result Value Ref Range   POC Glucose 128 (A) 70 - 99 mg/dl  POCT glycosylated hemoglobin (Hb A1C)  Result Value Ref Range   Hemoglobin A1C     HbA1c POC (<> result, manual entry)     HbA1c, POC (prediabetic range)     HbA1c, POC (controlled diabetic range) 6.9 0.0 - 7.0 %

## 2022-06-27 NOTE — Telephone Encounter (Signed)
Called & spoke to [place] at [facility]. Confirmed that [name] [name] was d/c and to take out of blister pack.  Called & spoke to the patient. Verified name & DOB. Informed that an iron supplement has been sent to the pharmacy. Informed that results showed that he is anemic and will need to take his iron supplement daily. Patient expressed verbal understanding. No further questions at this time

## 2022-06-27 NOTE — Telephone Encounter (Signed)
Called patient. He has already viewed result via MyChart. Informed him to take 2 antibiotics and Pepcid along with Protonix for 14 days. Informed him 4 weeks after antibiotics are completed, we will check H. Pylori stool test again to confirm eradication. Informed him I will contact to remind him to come to our office to pick up [employer] stool kit when it is time. Patient verbalized understanding.  Sent 2 antibiotics to the patient's pharmacy. Sent MyChart message as well.   Send myself a reminder to call patient in 6 weeks.

## 2022-07-04 ENCOUNTER — Other Ambulatory Visit: Payer: Self-pay | Admitting: Internal Medicine

## 2022-07-04 DIAGNOSIS — E1142 Type 2 diabetes mellitus with diabetic polyneuropathy: Secondary | ICD-10-CM

## 2022-07-10 LAB — LAB REPORT - SCANNED: EGFR: 19

## 2022-07-16 ENCOUNTER — Encounter: Payer: Self-pay | Admitting: Pharmacist

## 2022-07-16 ENCOUNTER — Ambulatory Visit: Payer: Medicaid Other | Attending: Internal Medicine | Admitting: Pharmacist

## 2022-07-16 VITALS — BP 113/73 | HR 68 | Wt 250.0 lb

## 2022-07-16 DIAGNOSIS — E1159 Type 2 diabetes mellitus with other circulatory complications: Secondary | ICD-10-CM | POA: Diagnosis not present

## 2022-07-16 DIAGNOSIS — I152 Hypertension secondary to endocrine disorders: Secondary | ICD-10-CM

## 2022-07-16 NOTE — Progress Notes (Signed)
   S:    PCP: Dr. Laural Benes  37 y.o. male who presents for medication reconciliation. PMH is significant for HTN, T2DM, and CKD. Patient was referred and last seen by Primary Care Provider, Dr. Laural Benes, on 06/25/2022.   At last visit, it was noted that [name] [name] was discontinued by patient's nephrologist.   Today, patient arrives in good spirits and presents without assistance. Denies dizziness, headache, blurred vision, swelling. All medications were reviewed. Patient brought in his pill pack today. Starting next week, his pill pack will not have [place] in it. He has appropriately been removing Kerendia from his blister back. BP in clinic 113/73 mmHg.   Medication adherence reported, uses blister packs from [facility]. Patient has taken BP medications today.   Current antihypertensives include: amlodipine 10 mg daily, carvedilol 25 mg BID, chlorthalidone 12.5 mg daily, valsartan 40 mg daily   Reported home BP readings: does not check at home, has wrist cuff but endorses it gives inaccurate readings.    O:  ROS  Physical Exam  Last 3 Office BP readings: BP Readings from Last 3 Encounters:  07/16/22 113/73  06/25/22 (!) 146/77  06/11/22 (!) 186/99    BMET    Component Value Date/Time   NA 143 04/17/2021 1529   K 5.0 04/17/2021 1529   CL 108 (H) 04/17/2021 1529   CO2 23 04/17/2021 1529   GLUCOSE 129 (H) 04/17/2021 1529   GLUCOSE 323 (H) 07/19/2020 1413   BUN 37 (H) 04/17/2021 1529   CREATININE 2.71 (H) 04/17/2021 1529   CREATININE 0.73 04/10/2016 1125   CALCIUM 9.3 04/17/2021 1529   GFRNONAA >60 07/19/2020 1413   GFRNONAA >89 04/10/2016 1125   GFRAA 91 01/27/2020 1209   GFRAA >89 04/10/2016 1125    Renal function: CrCl cannot be calculated (Patient's most recent lab result is older than the maximum 21 days allowed.).  Clinical ASCVD: No  The ASCVD Risk score (Arnett DK, et al., 2019) failed to calculate for the following reasons:   The 2019 ASCVD risk score is  only valid for ages 68 to 59  A/P: Hypertension longstanding, currently controlled on current medications. BP goal < 130/80 mmHg. Medication adherence appears appropriate. Confirmed patient is appropriately removing Kerendia from his blister pack.  -Continued current treatment.   -Counseled on lifestyle modifications for blood pressure control including reduced dietary sodium, increased exercise, adequate sleep. -Encouraged patient to check BP at home and bring log of readings to next visit. Counseled on proper use of home BP cuff.   Results reviewed and written information provided.    Written patient instructions provided. Patient verbalized understanding of treatment plan.  Total time in face to face counseling 30 minutes.    Follow-up:  Pharmacist PRN PCP clinic visit on 08/29/2022  Valeda Malm, Pharm.D. PGY-2 Ambulatory Care Pharmacy Resident

## 2022-07-18 ENCOUNTER — Encounter (HOSPITAL_COMMUNITY): Payer: Self-pay

## 2022-07-18 ENCOUNTER — Ambulatory Visit (INDEPENDENT_AMBULATORY_CARE_PROVIDER_SITE_OTHER): Payer: Medicaid Other | Admitting: Mental Health

## 2022-07-18 DIAGNOSIS — F332 Major depressive disorder, recurrent severe without psychotic features: Secondary | ICD-10-CM | POA: Diagnosis not present

## 2022-07-18 DIAGNOSIS — F1290 Cannabis use, unspecified, uncomplicated: Secondary | ICD-10-CM
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
## 2022-07-20 ENCOUNTER — Encounter (HOSPITAL_COMMUNITY): Payer: Self-pay

## 2022-08-08 NOTE — Telephone Encounter (Signed)
Called and spoke with patient to remind him that he is due for [employer] H. Pylori stool test at this time. Pt will stop by the 2nd floor on Friday to pick up [employer] H. Pylori kit and instructions. Patient verbalized understanding and had no concerns at the end of the call.    Misc order in epic.  Letter with instructions printed and placed with [employer] H. Pyl[employer] stool kit at 2nd floor receptionist desk. Demographics and insurance information attached with order.

## 2022-08-08 NOTE — Addendum Note (Signed)
Addended by: Coletta Memos on: 08/08/2022 11:23 AM   Modules accepted: Orders

## 2022-08-17 NOTE — Telephone Encounter (Signed)
Called patient. LVM to come to our 2nd floor receptionist desk to pick up [employer] stool kit.

## 2022-08-25 LAB — LAB REPORT - SCANNED: EGFR: 14

## 2022-08-28 ENCOUNTER — Ambulatory Visit (INDEPENDENT_AMBULATORY_CARE_PROVIDER_SITE_OTHER): Payer: Medicaid Other | Admitting: Mental Health

## 2022-08-28 DIAGNOSIS — F1290 Cannabis use, unspecified, uncomplicated: Secondary | ICD-10-CM

## 2022-08-28 DIAGNOSIS — F332 Major depressive disorder, recurrent severe without psychotic features: Secondary | ICD-10-CM

## 2022-08-28 NOTE — Progress Notes (Unsigned)
   THERAPIST PROGRESS NOTE  Session Time: 3:05 pm ( 53 minutes)  Participation Level: Active  Behavioral Response: CasualAlertEuthymic  Type of Therapy: Individual Therapy  Treatment Goals addressed: STG: "Dealing with stress." [name] [name] will increase management of low mood and stressors AEB development of x 3 effective distress tolerance and emotional regulation coping skills within the next 90 days.   ProgressTowards Goals: Progressing  Interventions: CBT and Supportive  Summary: [name] [name] is a 37 y.o. male who presents with dx of Major depression moderate and cannabis use disorder. [name] [name] presents for session alert and oriented; mood and affect adequate. Speech clear and coherent at normal rate and tone. Pleasant jovial demeanor. Shares to be doing ok and shares for moods to have been stable. Shares hx of feelings of increased depression related to children being taken away. Notes during that time is when suicidal thoughts and gestures presented with difficulty   Suicidal/Homicidal: Nowithout intent/plan  Therapist Response: Therapist engaged [name] [name] in therapy session. Reviewed intake assessment and reviewed bounds of confidentiality and informed consent. Provided safe space for [name] [name] to share thoughts and feelings related to presentation to therapy and concerns for delaying Kidney transplant. Open ended questions to explore current   Plan: Return again in  x 4 weeks.  Diagnosis: Moderately severe recurrent major depression (HCC)  Cannabis use disorder  Collaboration of Care: {BH OP Collaboration of Care:[id number]}  Patient/Guardian was advised Release of Information must be obtained prior to any record release in order to collaborate their care with an outside provider. Patient/Guardian was advised if they have not already done so to contact the registration department to sign all necessary forms in order for [place] to release information regarding their care.   Consent: Patient/Guardian  gives verbal consent for treatment and assignment of benefits for services provided during this visit. Patient/Guardian expressed understanding and agreed to proceed.   [name] [name], [facility] 08/28/2022

## 2022-08-29 ENCOUNTER — Other Ambulatory Visit: Payer: Self-pay | Admitting: Internal Medicine

## 2022-08-29 ENCOUNTER — Encounter: Payer: Self-pay | Admitting: Internal Medicine

## 2022-08-29 ENCOUNTER — Ambulatory Visit: Payer: Medicaid Other | Attending: Internal Medicine | Admitting: Internal Medicine

## 2022-08-29 VITALS — BP 139/80 | HR 75 | Temp 98.4°F | Ht 66.0 in | Wt 246.0 lb

## 2022-08-29 DIAGNOSIS — N5201 Erectile dysfunction due to arterial insufficiency: Secondary | ICD-10-CM

## 2022-08-29 DIAGNOSIS — E1142 Type 2 diabetes mellitus with diabetic polyneuropathy: Secondary | ICD-10-CM

## 2022-08-29 DIAGNOSIS — L602 Onychogryphosis: Secondary | ICD-10-CM

## 2022-08-29 DIAGNOSIS — I152 Hypertension secondary to endocrine disorders: Secondary | ICD-10-CM

## 2022-08-29 DIAGNOSIS — E1159 Type 2 diabetes mellitus with other circulatory complications: Secondary | ICD-10-CM

## 2022-08-29 DIAGNOSIS — Z7984 Long term (current) use of oral hypoglycemic drugs: Secondary | ICD-10-CM

## 2022-08-29 DIAGNOSIS — Z89429 Acquired absence of other toe(s), unspecified side: Secondary | ICD-10-CM

## 2022-08-29 DIAGNOSIS — E1169 Type 2 diabetes mellitus with other specified complication: Secondary | ICD-10-CM

## 2022-08-29 MED ORDER — SILDENAFIL CITRATE 50 MG PO TABS
ORAL_TABLET | ORAL | 2 refills | Status: DC
Start: 2022-08-29 — End: 2022-11-26

## 2022-08-29 NOTE — Progress Notes (Signed)
Patient ID: [name] [name], male    DOB: 1985/04/08  MRN: [id number]  CC: Diabetes (Dm f/u. )   Subjective: [name] [name] is a 37 y.o. 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   Did not bring meds with him today.  HTN: Reports compliance with taking his medications.  He should be on amlodipine 10 mg daily, carvedilol 25 mg BID, chlorthalidone 12.5 mg daily and Diovan 40 mg daily.  Took medicines already for today.  DM: Lab Results  Component Value Date   HGBA1C 6.9 06/25/2022  -did not bring CGM with him today.  Larey Seat off about 3 days ago; happens occasionally Reports BS staying below 200. Taking the Jardiance Reports he is figured out what causes nausea and vomiting for him.  Occurs whenever he eats greasy foods or tomato-based foods like pizza and spaghetti.  Denies any abdominal pain.  He is on Protonix for acid reflux.  Requesting to see urology for ED.  Reports he has had problems getting and maintaining an erection since age 68.  Had tried a friend's Viagra 1 time several years ago and not sure that it worked well.  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 2 diabetes mellitus (HCC) 06/25/2022   Moderately severe recurrent major depression (HCC) 06/21/2022   Cannabis use disorder 06/21/2022   Nausea and vomiting 06/11/2022   Bloating 06/11/2022   Gastritis and gastroduodenitis 06/11/2022   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   Chest pain of uncertain etiology 11/08/2021   Gastroparesis 04/17/2021   Abnormality of gait  10/11/2020   Macroalbuminuric diabetic nephropathy (HCC) 08/10/2020   Hemiparesis of right dominant side due to non-cerebrovascular etiology (HCC) 08/10/2020   Cellulitis, leg 03/17/2020   Cellulitis of right leg 03/17/2020   Uncontrolled type 2 diabetes mellitus with complication, with long-term current use of insulin    Abscess    Cellulitis 12/05/2019   Cellulitis of left lower extremity    Abscess of left lower extremity    Uncontrolled type 2 diabetes mellitus with hyperglycemia (HCC)    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 2 diabetes mellitus (HCC)    Cellulitis of foot, left 11/22/2016   Diabetic neuropathy (HCC) 11/22/2016   Fracture of 2nd metatarsal 11/22/2016   Fracture of 3rd metatarsal 11/22/2016   Postprocedural non-healing wound 11/22/2016   Acute cystitis with positive culture    Cutaneous abscess of left foot    Poorly controlled type 2 diabetes mellitus with complication (HCC) 10/27/2016   Hyperlipidemia 10/17/2016   Depression 10/17/2016   Neck pain    Asthma 04/11/2016   Uncontrolled type 2 diabetes mellitus with complication    Obesity, morbid (HCC) 09/06/2015   Hypertension associated with diabetes (HCC) 09/06/2015     Current Outpatient Medications on File Prior to Visit  Medication Sig Dispense Refill   chlorthalidone (HYGROTON) 25 MG tablet Take 12.5 mg by mouth daily.     Continuous Glucose Sensor (DEXCOM G6 SENSOR) MISC USE DAILY AS DIRECTED 3 each 3   Continuous Glucose Transmitter (DEXCOM G6 TRANSMITTER) MISC  USE AS DIRECTED DAILY 1 each 0   empagliflozin (JARDIANCE) 10 MG TABS tablet 1 tab PO Q Mon/Wed/Frid.  Stop Metformin. (Patient taking differently: Take 10 mg by mouth in the morning.) 12 tablet 2   famotidine (PEPCID) 20 MG tablet TAKE 1 TABLET (20 MG TOTAL) BY MOUTH AT BEDTIME. 90 tablet 3   ferrous sulfate 325 (65 FE) MG tablet Take 1 tablet (325 mg total) by mouth daily with breakfast. 100 tablet 1   gabapentin (NEURONTIN) 300 MG capsule TAKE 1 CAPSULE  (300 MG TOTAL) BY MOUTH 2 (TWO) TIMES DAILY. (AM+BEDTIME) 180 capsule 1   pantoprazole (PROTONIX) 40 MG tablet Take 1 tablet (40 mg total) by mouth 2 (two) times daily. 90 tablet 1   valsartan (DIOVAN) 40 MG tablet TAKE 1 TABLET (40 MG TOTAL) BY MOUTH DAILY. (AM) 90 tablet 1   No current facility-administered medications on file prior to visit.    Not on File  Social History   Socioeconomic History   Marital status: Single    Spouse name: Not on file   Number of children: 2   Years of education: Not on file   Highest education level: Not on file  Occupational History   Occupation: disabled  Tobacco Use   Smoking status: Never   Smokeless tobacco: Never  Vaping Use   Vaping Use: Some days   Substances: THC   Devices: THC  Substance and Sexual Activity   Alcohol use: Yes    Comment: 11/06/2016 "I'll drink a little at parties; maybe 5 times/year"   Drug use: Yes    Types: Marijuana    Comment: 11/06/2016 "stopped ~ 1 month ago"   Sexual activity: Never    Birth control/protection: Diaphragm  Other Topics Concern   Not on file  Social History Narrative   Not on file   Social Determinants of Health   Financial Resource Strain: Low Risk  (06/19/2022)   Overall Financial Resource Strain (CARDIA)    Difficulty of Paying Living Expenses: Not very hard  Food Insecurity: Food Insecurity Present (06/19/2022)   Hunger Vital Sign    Worried About Running Out of Food in the Last Year: Often true    Ran Out of Food in the Last Year: Often true  Transportation Needs: No Transportation Needs (06/19/2022)   PRAPARE - Administrator, Civil Service (Medical): No    Lack of Transportation (Non-Medical): No  Physical Activity: Insufficiently Active (06/19/2022)   Exercise Vital Sign    Days of Exercise per Week: 5 days    Minutes of Exercise per Session: 10 min  Stress: Stress Concern Present (06/19/2022)   [employer] of Occupational Health - Occupational Stress Questionnaire     Feeling of Stress : Very much  Social Connections: Socially Isolated (06/19/2022)   Social Connection and Isolation Panel [NHANES]    Frequency of Communication with Friends and Family: More than three times a week    Frequency of Social Gatherings with Friends and Family: More than three times a week    Attends Religious Services: Never    Database administrator or Organizations: No    Attends Banker Meetings: Never    Marital Status: Divorced  Catering manager Violence: Not At Risk (06/19/2022)   Humiliation, Afraid, Rape, and Kick questionnaire    Fear of Current or Ex-Partner: No    Emotionally Abused: No    Physically Abused: No    Sexually Abused: No    Family  History  Problem Relation Age of Onset   Diabetes type II Mother    Coronary artery disease Mother    Diabetes type II Father    Colon cancer Maternal Grandfather    Lung cancer Maternal Grandfather    Prostate cancer Maternal Grandfather    Diabetes Maternal Grandfather    Hypertension Maternal Grandfather    Heart disease Paternal Grandfather    Parkinson's disease Paternal Aunt    Multiple sclerosis Paternal Aunt     Past Surgical History:  Procedure Laterality Date   AMPUTATION Left 11/05/2016   Procedure: AMPUTATION LEFT GREAT TOE;  Surgeon: Nadara Mustard, MD;  Location: [facility];  Service: Orthopedics;  Laterality: Left;   BIOPSY  06/11/2022   Procedure: BIOPSY;  Surgeon: Shellia Cleverly, DO;  Location: [facility];  Service: Gastroenterology;;   CATARACT EXTRACTION Left 07/2020   DRESSING CHANGE UNDER ANESTHESIA N/A 04/13/2016   Procedure: DRESSING CHANGE UNDER ANESTHESIA;  Surgeon: Luretha Murphy, MD;  Location: [facility];  Service: General;  Laterality: N/A;   ESOPHAGOGASTRODUODENOSCOPY (EGD) WITH PROPOFOL N/A 06/11/2022   Procedure: ESOPHAGOGASTRODUODENOSCOPY (EGD) WITH PROPOFOL;  Surgeon: Shellia Cleverly, DO;  Location: [facility];  Service: Gastroenterology;  Laterality: N/A;    INCISION AND DRAINAGE ABSCESS Right 09/03/2015   Procedure: INCISION AND DRAINAGE ABSCESS;  Surgeon: Dominica Severin, MD;  Location: [facility];  Service: Orthopedics;  Laterality: Right;   INCISION AND DRAINAGE PERIRECTAL ABSCESS N/A 04/11/2016   Procedure: IRRIGATION AND DEBRIDEMENT RECTAL ABSCESS;  Surgeon: Luretha Murphy, MD;  Location: [facility];  Service: General;  Laterality: N/A;   INGUINAL HERNIA REPAIR  1988; 1989   x2    IRRIGATION AND DEBRIDEMENT ABSCESS Left 08/31/2015   Procedure: IRRIGATION AND DEBRIDEMENT ABSCESS LEFT SHOULDER;  Surgeon: Ovidio Kin, MD;  Location: [facility];  Service: General;  Laterality: Left;   RETINAL LASER PROCEDURE Bilateral    TUMOR EXCISION     neck   WOUND DEBRIDEMENT N/A 02/24/2016   Procedure: SACRAL WOUND DEBRIDEMENT;  Surgeon: Violeta Gelinas, MD;  Location: [facility];  Service: General;  Laterality: N/A;    ROS: Review of Systems Negative except as stated above  PHYSICAL EXAM: BP 139/80   Pulse 75   Temp 98.4 F (36.9 C) (Oral)   Ht 5\' 6"  (1.676 m)   Wt 246 lb (111.6 kg)   SpO2 99%   BMI 39.71 kg/m   Physical Exam   General appearance - alert, well appearing, middle-age Caucasian male and in no distress Mental status - normal mood, behavior, speech, dress, motor activity, and thought processes Chest - clear to auscultation, no wheezes, rales or rhonchi, symmetric air entry Heart - normal rate, regular rhythm, normal S1, S2, no murmurs, rubs, clicks or gallops Extremities -no lower extremity edema. Diabetic Foot Exam - Simple   Simple Foot Form Diabetic Foot exam was performed with the following findings: Yes 08/29/2022  6:10 PM  Visual Inspection See comments: Yes Sensation Testing See comments: Yes Pulse Check Posterior Tibialis and Dorsalis pulse intact bilaterally: Yes Comments First and second toes of the left foot have been amputated.  Overgrown toenails on the right foot especially the big toe.  Decrease sensation          Latest Ref Rng & Units 04/17/2021    3:29 PM 03/21/2021    3:36 PM 11/24/2020    1:58 PM  CMP  Glucose 70 - 99 mg/dL 161  096  045   BUN 6 - 20 mg/dL 37  34  35   Creatinine 0.76 - 1.27 mg/dL 1.61  0.96  0.45   Sodium 134 - 144 mmol/L 143  142  141   Potassium 3.5 - 5.2 mmol/L 5.0  3.8  4.6   Chloride 96 - 106 mmol/L 108  106  105   CO2 20 - 29 mmol/L 23  23  22    Calcium 8.7 - 10.2 mg/dL 9.3  8.8  9.3   Total Protein 6.0 - 8.5 g/dL 6.5  6.0    Total Bilirubin 0.0 - 1.2 mg/dL <4.0  <9.8    Alkaline Phos 44 - 121 IU/L 85  91    AST 0 - 40 IU/L 19  13    ALT 0 - 44 IU/L 16  12     Lipid Panel     Component Value Date/Time   CHOL 149 03/21/2021 1536   TRIG 104 03/21/2021 1536   HDL 37 (L) 03/21/2021 1536   CHOLHDL 4.0 03/21/2021 1536   CHOLHDL 4.6 12/06/2019 0749   VLDL 23 12/06/2019 0749   LDLCALC 93 03/21/2021 1536    CBC    Component Value Date/Time   WBC 11.8 (H) 06/25/2022 1627   WBC 10.2 07/19/2020 1413   RBC 3.20 (L) 06/25/2022 1627   RBC 4.13 (L) 07/19/2020 1413   HGB 9.7 (L) 06/25/2022 1627   HCT 28.7 (L) 06/25/2022 1627   PLT 258 06/25/2022 1627   MCV 90 06/25/2022 1627   MCH 30.3 06/25/2022 1627   MCH 28.8 07/19/2020 1413   MCHC 33.8 06/25/2022 1627   MCHC 35.1 07/19/2020 1413   RDW 13.7 06/25/2022 1627   LYMPHSABS 1.9 07/19/2020 1413   LYMPHSABS 1.7 01/27/2020 1209   MONOABS 0.5 07/19/2020 1413   EOSABS 0.2 07/19/2020 1413   EOSABS 0.1 01/27/2020 1209   BASOSABS 0.1 07/19/2020 1413   BASOSABS 0.1 01/27/2020 1209    ASSESSMENT AND PLAN: 1. Type 2 diabetes mellitus with peripheral neuropathy [facility]) Patient reports that his blood sugars have been good.  He will continue Jardiance.  Will be due for A1c on next visit.  Try using surgical tape if necessary to keep his CGM in place. Advised to avoid fatty and greasy foods.  Avoid foods that have tomato paste or tomato sauce in it given his history of GERD. - Microalbumin / creatinine urine ratio  2.  Hypertension associated with diabetes (HCC) Not at goal but improved on recheck.  He will continue current medications listed above  3. Erectile dysfunction due to arterial insufficiency Discussed trial of Viagra.  I went over how the medication works and possible side effects.  Advised to be seen in the emergency room if he has an erection lasting longer than 2 hours or has sudden changes in vision or hearing. - sildenafil (VIAGRA) 50 MG tablet; 1 tab PO 1/2 hr prior to sex PRN.  Limit use to 1 tab/24 hrs  Dispense: 10 tablet; Refill: 2  4. History of amputation of toe (HCC)   5. Overgrown toenails - Ambulatory referral to Podiatry     Patient was given the opportunity to ask questions.  Patient verbalized understanding of the plan and was able to repeat key elements of the plan.   This documentation was completed using Paediatric nurse.  Any transcriptional errors are unintentional.  Orders Placed This Encounter  Procedures   Microalbumin / creatinine urine ratio   Ambulatory referral to Podiatry     Requested Prescriptions   Signed Prescriptions  Disp Refills   sildenafil (VIAGRA) 50 MG tablet 10 tablet 2    Sig: 1 tab PO 1/2 hr prior to sex PRN.  Limit use to 1 tab/24 hrs    Return in about 4 months (around 12/29/2022).  Jonah Blue, MD, FACP

## 2022-08-30 LAB — MICROALBUMIN / CREATININE URINE RATIO
Creatinine, Urine: 66.6 mg/dL
Microalb/Creat Ratio: 1008 mg/g creat — ABNORMAL HIGH (ref 0–29)
Microalbumin, Urine: 671 ug/mL

## 2022-09-11 ENCOUNTER — Ambulatory Visit: Payer: Medicaid Other | Admitting: Podiatry

## 2022-10-02 ENCOUNTER — Other Ambulatory Visit: Payer: Self-pay | Admitting: Internal Medicine

## 2022-10-02 DIAGNOSIS — K219 Gastro-esophageal reflux disease without esophagitis: Secondary | ICD-10-CM

## 2022-10-09 ENCOUNTER — Ambulatory Visit (INDEPENDENT_AMBULATORY_CARE_PROVIDER_SITE_OTHER): Payer: Medicaid Other | Admitting: Mental Health

## 2022-10-09 DIAGNOSIS — F332 Major depressive disorder, recurrent severe without psychotic features: Secondary | ICD-10-CM | POA: Diagnosis not present

## 2022-10-09 DIAGNOSIS — F1290 Cannabis use, unspecified, uncomplicated: Secondary | ICD-10-CM

## 2022-10-09 NOTE — Progress Notes (Unsigned)
   THERAPIST PROGRESS NOTE  Session Time: 3: 09 pm ( 30 minutes)  Participation Level: Active  Behavioral Response: CasualAlertWNL  Type of Therapy: Individual Therapy  Treatment Goals addressed: STG: "Dealing with stress." Stacey will increase management of low mood and stressors AEB development of x 3 effective distress tolerance and emotional regulation coping skills within the next 90 days.   ProgressTowards Goals: Progressing  Interventions: CBT and Supportive  Summary:Dylen Steinert is a 37 y.o. male who presents with dx of Major depression moderate and cannabis use disorder. Mareon presents for session alert and oriented; mood and affect adequate but low. Speech clear and coherent at normal rate and tone. Shares to feel unwell and has been without his medications for several days. Shares awaiting doctor to confirm a medication to be sent out with medications being bubble packed. Shares feelings of nausea and has been vomiting; noting to be from GERD. Denies lack of appetite and low energy. Shares for mental health to be stable; denies excessive lows. Reports has obtained a pet dog which he has enjoyed and smiles when sharing. Shares having dog is getting him out of the house more walking and explored benefits of having pet. Working to manage feelings of stress; current stressor medication. Notes feelings of hope of new procedure/medications to support with kidney functioning; future focused.  Sends My chart message to provider during session and agrees to follow up following day via phone if no response.  Notes feeling unwell is effecting his mood. In need of using restroom to vomit during session. Shares plans to attempt to get something to eat and rest. Denies SI/HI. Ongoing progress with goals; denies SI/HI.    Suicidal/Homicidal: Nowithout intent/plan  Therapist Response: Therapist engaged Wynne in therapy session. Provided safe space for Dodd to share thoughts and feelings related to  current concerns and stressors of not having medications. Explored feelings of low mood and reviewed ability to cope. Supported in reaching out to provider and supported with Conservation officer, historic buildings message. Encouraged to follow up via phone call if no response. Explored benefits of having pet and discussed benefits of mood, companionship and increased community engagement. Encouraged to get rest and present to ED for medical emergencies if they present. Reviewed session provided follow up. No safety concerns reported.  Plan: Return again in x 6 weeks.  Diagnosis: Moderately severe recurrent major depression (HCC)  Cannabis use disorder  Collaboration of Care: Other None  Patient/Guardian was advised Release of Information must be obtained prior to any record release in order to collaborate their care with an outside provider. Patient/Guardian was advised if they have not already done so to contact the registration department to sign all necessary forms in order for [place] to release information regarding their care.   Consent: Patient/Guardian gives verbal consent for treatment and assignment of benefits for services provided during this visit. Patient/Guardian expressed understanding and agreed to proceed.   Stephan Minister [place], [facility] 10/09/2022

## 2022-10-10 ENCOUNTER — Other Ambulatory Visit: Payer: Self-pay | Admitting: Internal Medicine

## 2022-10-10 NOTE — Telephone Encounter (Signed)
Medication Refill - Medication: empagliflozin (JARDIANCE) 10 MG TABS tablet Pt states that he has been out of medication for a week.   Has the patient contacted their pharmacy? Yes.   Pt pharmacy advised that they have not gotten a response from pt PCP for the refill request that they sent in.    Preferred Pharmacy (with phone number or street name): [facility] - [place], [place] - [address]  Phone: [phone] Fax: [phone]  Has the patient been seen for an appointment in the last year OR does the patient have an upcoming appointment? Yes.    Agent: Please be advised that RX refills may take up to 3 business days. We ask that you follow-up with your pharmacy.

## 2022-10-11 MED ORDER — EMPAGLIFLOZIN 10 MG PO TABS: ORAL_TABLET | ORAL | 2 refills | Status: DC

## 2022-10-11 NOTE — Telephone Encounter (Signed)
Requested medications are due for refill today.  See note  Requested medications are on the active medications list.  yes  Last refill. 03/22/2021 #12 2 rf  Future visit scheduled.   yes  Notes to clinic.  Per rx pt should be taking 3x weekly. Per pt he takes med daily. Please advise. Pt states he is out of medication.  empagliflozin (JARDIANCE) 10 MG TABS tablet 12 tablet 2 03/22/2021 --  Sig:   1 tab PO Q Mon/Wed/Frid.  Stop Metformin.    Patient taking differently:   Take 10 mg by mouth in the morning.    Route:   (none)        Requested Prescriptions  Pending Prescriptions Disp Refills   empagliflozin (JARDIANCE) 10 MG TABS tablet 12 tablet 2    Sig: 1 tab PO Q Mon/Wed/Frid.  Stop Metformin.     Endocrinology:  Diabetes - SGLT2 Inhibitors Failed - 10/10/2022  2:30 PM      Failed - Cr in normal range and within 360 days    Creat  Date Value Ref Range Status  04/10/2016 0.73 0.60 - 1.35 mg/dL Final   Creatinine, Ser  Date Value Ref Range Status  04/17/2021 2.71 (H) 0.76 - 1.27 mg/dL Final         Passed - HBA1C is between 0 and 7.9 and within 180 days    HbA1c, POC (controlled diabetic range)  Date Value Ref Range Status  06/25/2022 6.9 0.0 - 7.0 % Final         Passed - eGFR in normal range and within 360 days    GFR, Est African American  Date Value Ref Range Status  04/10/2016 >89 >=60 mL/min Final   GFR calc Af Amer  Date Value Ref Range Status  01/27/2020 91 >59 mL/min/1.73 Final    Comment:    **In accordance with recommendations from the NKF-ASN Task force,**   [facility] is in the process of updating its eGFR calculation to the   2021 CKD-EPI creatinine equation that estimates kidney function   without a race variable.    GFR, Est Non African American  Date Value Ref Range Status  04/10/2016 >89 >=60 mL/min Final   GFR, Estimated  Date Value Ref Range Status  07/19/2020 >60 >60 mL/min Final    Comment:    (NOTE) Calculated using the CKD-EPI  Creatinine Equation (2021)    eGFR  Date Value Ref Range Status  04/17/2021 31 (L) >59 mL/min/1.73 Final   EGFR  Date Value Ref Range Status  08/24/2022 14.0  Final    Comment:    Abstracted by HIM         Passed - Valid encounter within last 6 months    Recent Outpatient Visits           1 month ago Type 2 diabetes mellitus with peripheral neuropathy [facility])   [place] [facility] Jonah Blue B, MD   2 months ago Hypertension associated with diabetes [facility])   [place] [facility] [place], [place] L, RPH-CPP   3 months ago Diabetic polyneuropathy associated with type 2 diabetes mellitus [facility])   [place] [facility] Jonah Blue B, MD   5 months ago Hypertension associated with diabetes [facility])   [facility] [place], [place] L, RPH-CPP   6 months ago Hypertension associated with diabetes [facility])   [place] [facility] Drucilla Chalet, RPH-CPP       Future Appointments             In 2 months Laural Benes, Binnie Rail, MD [facility]

## 2022-10-12 ENCOUNTER — Other Ambulatory Visit: Payer: Self-pay

## 2022-10-12 ENCOUNTER — Other Ambulatory Visit (HOSPITAL_COMMUNITY): Payer: Self-pay

## 2022-10-15 ENCOUNTER — Telehealth: Payer: Self-pay

## 2022-10-15 ENCOUNTER — Other Ambulatory Visit: Payer: Self-pay

## 2022-10-15 NOTE — Telephone Encounter (Signed)
A prior authorization request for [name] [name] has been submitted to insurance today via CoverMyMeds Key: [id number]

## 2022-11-05 NOTE — H&P (View-Only) (Signed)
[facility] [place]  ASSESSMENT / PLAN: [name] [name] is a 37 y.o. left handed male in need of permanent dialysis access. I reviewed options for dialysis in detail with the patient, including hemodialysis and peritoneal dialysis. I counseled the patient to ask their nephrologist about their candidacy for renal transplant. I counseled the patient that dialysis access requires surveillance and periodic maintenance. Plan to proceed with laparoscopic peritoneal dialysis catheter as OR schedule allows.   CHIEF COMPLAINT: worsening renal function  HISTORY OF PRESENT ILLNESS: [name] [name] is a 37 y.o. male referred to clinic for deteriorating chronic kidney disease.  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  He has never had dialysis access before.  The patient strongly desires peritoneal dialysis.  He had neurosurgery of his cervical spine and 2020 which is complicated by residual right-sided weakness.  The patient is able to ambulate and use his right hand, though this is challenging for him.  He was previously right-hand-dominant.  He now uses his left hand predominantly.  I counseled the patient about the 2 main modes of dialysis therapy.  This patient strongly prefers peritoneal dialysis.  He reports he is able to perform fine motor activities with his left arm without difficulty and demonstrated for me today.  He also reports sufficient social support to get him through peritoneal dialysis.   Past Medical History:  Diagnosis Date   Anemia    Arthritis    "neck; maybe in my fingers" (11/06/2016)   Asthma    Bipolar disorder (HCC)    "IED: intentional deficit disorder"   Chronic kidney disease (CKD), stage III (moderate) (HCC)    Depression    Heart murmur    "when I was a child"   Hypertension    Type II diabetes mellitus (HCC)     Past Surgical History:  Procedure Laterality Date   AMPUTATION Left 11/05/2016   Procedure: AMPUTATION LEFT GREAT TOE;   Surgeon: Nadara Mustard, MD;  [facility];  Service: Orthopedics;  Laterality: Left;   BIOPSY  06/11/2022   Procedure: BIOPSY;  Surgeon: Shellia Cleverly, DO;  [facility];  Service: Gastroenterology;;   CATARACT EXTRACTION Left 07/2020   DRESSING CHANGE UNDER ANESTHESIA N/A 04/13/2016   Procedure: DRESSING CHANGE UNDER ANESTHESIA;  Surgeon: Luretha Murphy, MD;  [facility];  Service: General;  Laterality: N/A;   ESOPHAGOGASTRODUODENOSCOPY (EGD) WITH PROPOFOL N/A 06/11/2022   Procedure: ESOPHAGOGASTRODUODENOSCOPY (EGD) WITH PROPOFOL;  Surgeon: Shellia Cleverly, DO;  [facility];  Service: Gastroenterology;  Laterality: N/A;   INCISION AND DRAINAGE ABSCESS Right 09/03/2015   Procedure: INCISION AND DRAINAGE ABSCESS;  Surgeon: Dominica Severin, MD;  [facility];  Service: Orthopedics;  Laterality: Right;   INCISION AND DRAINAGE PERIRECTAL ABSCESS N/A 04/11/2016   Procedure: IRRIGATION AND DEBRIDEMENT RECTAL ABSCESS;  Surgeon: Luretha Murphy, MD;  [facility];  Service: General;  Laterality: N/A;   INGUINAL HERNIA REPAIR  1988; 1989   x2    IRRIGATION AND DEBRIDEMENT ABSCESS Left 08/31/2015   Procedure: IRRIGATION AND DEBRIDEMENT ABSCESS LEFT SHOULDER;  Surgeon: Ovidio Kin, MD;  [facility];  Service: General;  Laterality: Left;   RETINAL LASER PROCEDURE Bilateral    TUMOR EXCISION     neck   WOUND DEBRIDEMENT N/A 02/24/2016   Procedure: SACRAL WOUND DEBRIDEMENT;  Surgeon: Violeta Gelinas, MD;  [facility];  Service: General;  Laterality: N/A;    Family History  Problem Relation Age of Onset  Diabetes type II Mother    Coronary artery disease Mother    Diabetes type II Father    Colon cancer Maternal Grandfather    Lung cancer Maternal Grandfather    Prostate cancer Maternal Grandfather    Diabetes Maternal Grandfather    Hypertension Maternal Grandfather    Heart disease Paternal Grandfather    Parkinson's disease Paternal Aunt     Multiple sclerosis Paternal Aunt     Social History   Socioeconomic History   Marital status: Single    Spouse name: Not on file   Number of children: 2   Years of education: Not on file   Highest education level: Not on file  Occupational History   Occupation: disabled  Tobacco Use   Smoking status: Never   Smokeless tobacco: Never  Vaping Use   Vaping status: Some Days   Substances: THC   Devices: THC  Substance and Sexual Activity   Alcohol use: Yes    Comment: 11/06/2016 "I'll drink a little at parties; maybe 5 times/year"   Drug use: Yes    Types: Marijuana    Comment: 11/06/2016 "stopped ~ 1 month ago"   Sexual activity: Never    Birth control/protection: Diaphragm  Other Topics Concern   Not on file  Social History Narrative   Not on file   Social Determinants of Health   Financial Resource Strain: Low Risk  (06/19/2022)   Overall Financial Resource Strain (CARDIA)    Difficulty of Paying Living Expenses: Not very hard  Food Insecurity: Food Insecurity Present (06/19/2022)   Hunger Vital Sign    Worried About Running Out of Food in the Last Year: Often true    Ran Out of Food in the Last Year: Often true  Transportation Needs: No Transportation Needs (06/19/2022)   PRAPARE - Administrator, Civil Service (Medical): No    Lack of Transportation (Non-Medical): No  Physical Activity: Insufficiently Active (06/19/2022)   Exercise Vital Sign    Days of Exercise per Week: 5 days    Minutes of Exercise per Session: 10 min  Stress: Stress Concern Present (06/19/2022)   [employer] of Occupational Health - Occupational Stress Questionnaire    Feeling of Stress : Very much  Social Connections: Socially Isolated (06/19/2022)   Social Connection and Isolation Panel [NHANES]    Frequency of Communication with Friends and Family: More than three times a week    Frequency of Social Gatherings with Friends and Family: More than three times a week    Attends  Religious Services: Never    Database administrator or Organizations: No    Attends Banker Meetings: Never    Marital Status: Divorced  Catering manager Violence: Not At Risk (06/19/2022)   Humiliation, Afraid, Rape, and Kick questionnaire    Fear of Current or Ex-Partner: No    Emotionally Abused: No    Physically Abused: No    Sexually Abused: No    No Known Allergies  Current Outpatient Medications  Medication Sig Dispense Refill   amLODipine (NORVASC) 10 MG tablet TAKE ONE TABLET BY MOUTH ONCE DAILY (AM) 90 tablet 1   carvedilol (COREG) 25 MG tablet TAKE 1 TABLET BY MOUTH 2 (TWO) TIMES DAILY WITH A MEAL (AM+PM) 180 tablet 1   chlorthalidone (HYGROTON) 25 MG tablet Take 12.5 mg by mouth daily.     Continuous Glucose Sensor (DEXCOM G6 SENSOR) MISC USE DAILY AS DIRECTED 3 each 3   Continuous Glucose  Transmitter (DEXCOM G6 TRANSMITTER) MISC USE AS DIRECTED DAILY 1 each 0   empagliflozin (JARDIANCE) 10 MG TABS tablet 1 tab PO Q Mon/Wed/Frid.  Stop Metformin. 12 tablet 2   famotidine (PEPCID) 20 MG tablet TAKE 1 TABLET (20 MG TOTAL) BY MOUTH AT BEDTIME. 90 tablet 3   ferrous sulfate 325 (65 FE) MG tablet Take 1 tablet (325 mg total) by mouth daily with breakfast. 100 tablet 1   gabapentin (NEURONTIN) 300 MG capsule TAKE 1 CAPSULE (300 MG TOTAL) BY MOUTH 2 (TWO) TIMES DAILY. (AM+BEDTIME) 60 capsule 6   pantoprazole (PROTONIX) 40 MG tablet Take 1 tablet (40 mg total) by mouth 2 (two) times daily. 90 tablet 1   pravastatin (PRAVACHOL) 20 MG tablet TAKE 1 TABLET (20 MG TOTAL) BY MOUTH DAILY. (BEDTIME) 90 tablet 1   sildenafil (VIAGRA) 50 MG tablet 1 tab PO 1/2 hr prior to sex PRN.  Limit use to 1 tab/24 hrs 10 tablet 2   valsartan (DIOVAN) 40 MG tablet TAKE 1 TABLET (40 MG TOTAL) BY MOUTH DAILY. (AM) 90 tablet 1   No current facility-administered medications for this visit.    PHYSICAL EXAM Vitals:   11/06/22 1412  BP: (!) 89/56  Pulse: 66  Resp: 20  Temp: 98.1 F (36.7  C)  SpO2: 97%  Weight: 238 lb (108 kg)  Height: 5\' 6"  (1.676 m)    Chronically ill young man in no distress Obese Right sided hemiplegia Abdomen is soft and nontender   PERTINENT LABORATORY AND RADIOLOGIC DATA  Most recent CBC    Latest Ref Rng & Units 06/25/2022    4:27 PM 03/21/2021    3:36 PM 07/19/2020    2:13 PM  CBC  WBC 3.4 - 10.8 x10E3/uL 11.8  10.3  10.2   Hemoglobin 13.0 - 17.7 g/dL 9.7  82.9  56.2   Hematocrit 37.5 - 51.0 % 28.7  30.0  33.9   Platelets 150 - 450 x10E3/uL 258  308  246      Most recent CMP    Latest Ref Rng & Units 04/17/2021    3:29 PM 03/21/2021    3:36 PM 11/24/2020    1:58 PM  CMP  Glucose 70 - 99 mg/dL 130  865  784   BUN 6 - 20 mg/dL 37  34  35   Creatinine 0.76 - 1.27 mg/dL 6.96  2.95  2.84   Sodium 134 - 144 mmol/L 143  142  141   Potassium 3.5 - 5.2 mmol/L 5.0  3.8  4.6   Chloride 96 - 106 mmol/L 108  106  105   CO2 20 - 29 mmol/L 23  23  22    Calcium 8.7 - 10.2 mg/dL 9.3  8.8  9.3   Total Protein 6.0 - 8.5 g/dL 6.5  6.0    Total Bilirubin 0.0 - 1.2 mg/dL <1.3  <2.4    Alkaline Phos 44 - 121 IU/L 85  91    AST 0 - 40 IU/L 19  13    ALT 0 - 44 IU/L 16  12      Renal function CrCl cannot be calculated (Patient's most recent lab result is older than the maximum 21 days allowed.).  HbA1c, POC (controlled diabetic range) (%)  Date Value  06/25/2022 6.9    LDL Chol Calc (NIH)  Date Value Ref Range Status  03/21/2021 93 0 - 99 mg/dL Final     Rande Brunt. Lenell Antu, MD FACS [facility] [employer] Number: [phone] 11/06/2022 8:49 PM   Total time spent on preparing this encounter including chart review, data review, collecting history, examining the patient, coordinating care for this new patient, 60 minutes.  Portions of this report may have been transcribed using voice recognition software.  Every effort has been made to ensure accuracy; however, inadvertent computerized transcription errors may  still be present.

## 2022-11-05 NOTE — Progress Notes (Unsigned)
[facility] [place]  ASSESSMENT / PLAN: [name] [name] is a 37 y.o. left handed male in need of permanent dialysis access. I reviewed options for dialysis in detail with the patient, including hemodialysis and peritoneal dialysis. I counseled the patient to ask their nephrologist about their candidacy for renal transplant. I counseled the patient that dialysis access requires surveillance and periodic maintenance. Plan to proceed with laparoscopic peritoneal dialysis catheter as OR schedule allows.   CHIEF COMPLAINT: worsening renal function  HISTORY OF PRESENT ILLNESS: [name] [name] is a 37 y.o. male referred to clinic for deteriorating chronic kidney disease.  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  He has never had dialysis access before.  The patient strongly desires peritoneal dialysis.  He had neurosurgery of his cervical spine and 2020 which is complicated by residual right-sided weakness.  The patient is able to ambulate and use his right hand, though this is challenging for him.  He was previously right-hand-dominant.  He now uses his left hand predominantly.  I counseled the patient about the 2 main modes of dialysis therapy.  This patient strongly prefers peritoneal dialysis.  He reports he is able to perform fine motor activities with his left arm without difficulty and demonstrated for me today.  He also reports sufficient social support to get him through peritoneal dialysis.   Past Medical History:  Diagnosis Date   Anemia    Arthritis    "neck; maybe in my fingers" (11/06/2016)   Asthma    Bipolar disorder (HCC)    "IED: intentional deficit disorder"   Chronic kidney disease (CKD), stage III (moderate) (HCC)    Depression    Heart murmur    "when I was a child"   Hypertension    Type II diabetes mellitus (HCC)     Past Surgical History:  Procedure Laterality Date   AMPUTATION Left 11/05/2016   Procedure: AMPUTATION LEFT GREAT TOE;   Surgeon: Nadara Mustard, MD;  [facility];  Service: Orthopedics;  Laterality: Left;   BIOPSY  06/11/2022   Procedure: BIOPSY;  Surgeon: Shellia Cleverly, DO;  [facility];  Service: Gastroenterology;;   CATARACT EXTRACTION Left 07/2020   DRESSING CHANGE UNDER ANESTHESIA N/A 04/13/2016   Procedure: DRESSING CHANGE UNDER ANESTHESIA;  Surgeon: Luretha Murphy, MD;  [facility];  Service: General;  Laterality: N/A;   ESOPHAGOGASTRODUODENOSCOPY (EGD) WITH PROPOFOL N/A 06/11/2022   Procedure: ESOPHAGOGASTRODUODENOSCOPY (EGD) WITH PROPOFOL;  Surgeon: Shellia Cleverly, DO;  [facility];  Service: Gastroenterology;  Laterality: N/A;   INCISION AND DRAINAGE ABSCESS Right 09/03/2015   Procedure: INCISION AND DRAINAGE ABSCESS;  Surgeon: Dominica Severin, MD;  [facility];  Service: Orthopedics;  Laterality: Right;   INCISION AND DRAINAGE PERIRECTAL ABSCESS N/A 04/11/2016   Procedure: IRRIGATION AND DEBRIDEMENT RECTAL ABSCESS;  Surgeon: Luretha Murphy, MD;  [facility];  Service: General;  Laterality: N/A;   INGUINAL HERNIA REPAIR  1988; 1989   x2    IRRIGATION AND DEBRIDEMENT ABSCESS Left 08/31/2015   Procedure: IRRIGATION AND DEBRIDEMENT ABSCESS LEFT SHOULDER;  Surgeon: Ovidio Kin, MD;  [facility];  Service: General;  Laterality: Left;   RETINAL LASER PROCEDURE Bilateral    TUMOR EXCISION     neck   WOUND DEBRIDEMENT N/A 02/24/2016   Procedure: SACRAL WOUND DEBRIDEMENT;  Surgeon: Violeta Gelinas, MD;  [facility];  Service: General;  Laterality: N/A;    Family History  Problem Relation Age of Onset  Diabetes type II Mother    Coronary artery disease Mother    Diabetes type II Father    Colon cancer Maternal Grandfather    Lung cancer Maternal Grandfather    Prostate cancer Maternal Grandfather    Diabetes Maternal Grandfather    Hypertension Maternal Grandfather    Heart disease Paternal Grandfather    Parkinson's disease Paternal Aunt     Multiple sclerosis Paternal Aunt     Social History   Socioeconomic History   Marital status: Single    Spouse name: Not on file   Number of children: 2   Years of education: Not on file   Highest education level: Not on file  Occupational History   Occupation: disabled  Tobacco Use   Smoking status: Never   Smokeless tobacco: Never  Vaping Use   Vaping status: Some Days   Substances: THC   Devices: THC  Substance and Sexual Activity   Alcohol use: Yes    Comment: 11/06/2016 "I'll drink a little at parties; maybe 5 times/year"   Drug use: Yes    Types: Marijuana    Comment: 11/06/2016 "stopped ~ 1 month ago"   Sexual activity: Never    Birth control/protection: Diaphragm  Other Topics Concern   Not on file  Social History Narrative   Not on file   Social Determinants of Health   Financial Resource Strain: Low Risk  (06/19/2022)   Overall Financial Resource Strain (CARDIA)    Difficulty of Paying Living Expenses: Not very hard  Food Insecurity: Food Insecurity Present (06/19/2022)   Hunger Vital Sign    Worried About Running Out of Food in the Last Year: Often true    Ran Out of Food in the Last Year: Often true  Transportation Needs: No Transportation Needs (06/19/2022)   PRAPARE - Administrator, Civil Service (Medical): No    Lack of Transportation (Non-Medical): No  Physical Activity: Insufficiently Active (06/19/2022)   Exercise Vital Sign    Days of Exercise per Week: 5 days    Minutes of Exercise per Session: 10 min  Stress: Stress Concern Present (06/19/2022)   [employer] of Occupational Health - Occupational Stress Questionnaire    Feeling of Stress : Very much  Social Connections: Socially Isolated (06/19/2022)   Social Connection and Isolation Panel [NHANES]    Frequency of Communication with Friends and Family: More than three times a week    Frequency of Social Gatherings with Friends and Family: More than three times a week    Attends  Religious Services: Never    Database administrator or Organizations: No    Attends Banker Meetings: Never    Marital Status: Divorced  Catering manager Violence: Not At Risk (06/19/2022)   Humiliation, Afraid, Rape, and Kick questionnaire    Fear of Current or Ex-Partner: No    Emotionally Abused: No    Physically Abused: No    Sexually Abused: No    No Known Allergies  Current Outpatient Medications  Medication Sig Dispense Refill   amLODipine (NORVASC) 10 MG tablet TAKE ONE TABLET BY MOUTH ONCE DAILY (AM) 90 tablet 1   carvedilol (COREG) 25 MG tablet TAKE 1 TABLET BY MOUTH 2 (TWO) TIMES DAILY WITH A MEAL (AM+PM) 180 tablet 1   chlorthalidone (HYGROTON) 25 MG tablet Take 12.5 mg by mouth daily.     Continuous Glucose Sensor (DEXCOM G6 SENSOR) MISC USE DAILY AS DIRECTED 3 each 3   Continuous Glucose  Transmitter (DEXCOM G6 TRANSMITTER) MISC USE AS DIRECTED DAILY 1 each 0   empagliflozin (JARDIANCE) 10 MG TABS tablet 1 tab PO Q Mon/Wed/Frid.  Stop Metformin. 12 tablet 2   famotidine (PEPCID) 20 MG tablet TAKE 1 TABLET (20 MG TOTAL) BY MOUTH AT BEDTIME. 90 tablet 3   ferrous sulfate 325 (65 FE) MG tablet Take 1 tablet (325 mg total) by mouth daily with breakfast. 100 tablet 1   gabapentin (NEURONTIN) 300 MG capsule TAKE 1 CAPSULE (300 MG TOTAL) BY MOUTH 2 (TWO) TIMES DAILY. (AM+BEDTIME) 60 capsule 6   pantoprazole (PROTONIX) 40 MG tablet Take 1 tablet (40 mg total) by mouth 2 (two) times daily. 90 tablet 1   pravastatin (PRAVACHOL) 20 MG tablet TAKE 1 TABLET (20 MG TOTAL) BY MOUTH DAILY. (BEDTIME) 90 tablet 1   sildenafil (VIAGRA) 50 MG tablet 1 tab PO 1/2 hr prior to sex PRN.  Limit use to 1 tab/24 hrs 10 tablet 2   valsartan (DIOVAN) 40 MG tablet TAKE 1 TABLET (40 MG TOTAL) BY MOUTH DAILY. (AM) 90 tablet 1   No current facility-administered medications for this visit.    PHYSICAL EXAM Vitals:   11/06/22 1412  BP: (!) 89/56  Pulse: 66  Resp: 20  Temp: 98.1 F (36.7  C)  SpO2: 97%  Weight: 238 lb (108 kg)  Height: 5\' 6"  (1.676 m)    Chronically ill young man in no distress Obese Right sided hemiplegia Abdomen is soft and nontender   PERTINENT LABORATORY AND RADIOLOGIC DATA  Most recent CBC    Latest Ref Rng & Units 06/25/2022    4:27 PM 03/21/2021    3:36 PM 07/19/2020    2:13 PM  CBC  WBC 3.4 - 10.8 x10E3/uL 11.8  10.3  10.2   Hemoglobin 13.0 - 17.7 g/dL 9.7  09.8  11.9   Hematocrit 37.5 - 51.0 % 28.7  30.0  33.9   Platelets 150 - 450 x10E3/uL 258  308  246      Most recent CMP    Latest Ref Rng & Units 04/17/2021    3:29 PM 03/21/2021    3:36 PM 11/24/2020    1:58 PM  CMP  Glucose 70 - 99 mg/dL 147  829  562   BUN 6 - 20 mg/dL 37  34  35   Creatinine 0.76 - 1.27 mg/dL 1.30  8.65  7.84   Sodium 134 - 144 mmol/L 143  142  141   Potassium 3.5 - 5.2 mmol/L 5.0  3.8  4.6   Chloride 96 - 106 mmol/L 108  106  105   CO2 20 - 29 mmol/L 23  23  22    Calcium 8.7 - 10.2 mg/dL 9.3  8.8  9.3   Total Protein 6.0 - 8.5 g/dL 6.5  6.0    Total Bilirubin 0.0 - 1.2 mg/dL <6.9  <6.2    Alkaline Phos 44 - 121 IU/L 85  91    AST 0 - 40 IU/L 19  13    ALT 0 - 44 IU/L 16  12      Renal function CrCl cannot be calculated (Patient's most recent lab result is older than the maximum 21 days allowed.).  HbA1c, POC (controlled diabetic range) (%)  Date Value  06/25/2022 6.9    LDL Chol Calc (NIH)  Date Value Ref Range Status  03/21/2021 93 0 - 99 mg/dL Final     Rande Brunt. Lenell Antu, MD FACS [facility] [employer] Number: [id number] 11/06/2022 8:49 PM   Total time spent on preparing this encounter including chart review, data review, collecting history, examining the patient, coordinating care for this new patient, 60 minutes.  Portions of this report may have been transcribed using voice recognition software.  Every effort has been made to ensure accuracy; however, inadvertent computerized transcription errors may  still be present.

## 2022-11-06 ENCOUNTER — Ambulatory Visit: Payer: Medicaid Other | Admitting: Vascular Surgery

## 2022-11-06 ENCOUNTER — Encounter: Payer: Self-pay | Admitting: Vascular Surgery

## 2022-11-06 VITALS — BP 89/56 | HR 66 | Temp 98.1°F | Resp 20 | Ht 66.0 in | Wt 238.0 lb

## 2022-11-06 DIAGNOSIS — N185 Chronic kidney disease, stage 5: Secondary | ICD-10-CM | POA: Diagnosis not present

## 2022-11-06 LAB — LAB REPORT - SCANNED: EGFR: 18

## 2022-11-07 ENCOUNTER — Other Ambulatory Visit: Payer: Self-pay | Admitting: Internal Medicine

## 2022-11-07 DIAGNOSIS — I152 Hypertension secondary to endocrine disorders: Secondary | ICD-10-CM

## 2022-11-07 DIAGNOSIS — E1142 Type 2 diabetes mellitus with diabetic polyneuropathy: Secondary | ICD-10-CM

## 2022-11-12 ENCOUNTER — Other Ambulatory Visit: Payer: Self-pay

## 2022-11-12 DIAGNOSIS — N185 Chronic kidney disease, stage 5: Secondary | ICD-10-CM

## 2022-11-23 NOTE — Progress Notes (Signed)
This encounter was created in error - please disregard.

## 2022-11-28 ENCOUNTER — Encounter (HOSPITAL_COMMUNITY): Payer: Self-pay | Admitting: Vascular Surgery

## 2022-11-28 ENCOUNTER — Other Ambulatory Visit: Payer: Self-pay

## 2022-11-28 NOTE — Anesthesia Preprocedure Evaluation (Signed)
Anesthesia Evaluation  Patient identified by MRN, date of birth, ID band Patient awake    Reviewed: Allergy & Precautions, H&P , NPO status , Patient's Chart, lab work & pertinent test results  Airway Mallampati: II  TM Distance: >3 FB Neck ROM: Full    Dental no notable dental hx.    Pulmonary asthma    Pulmonary exam normal breath sounds clear to auscultation       Cardiovascular hypertension, negative cardio ROS Normal cardiovascular exam Rhythm:Regular Rate:Normal     Neuro/Psych  PSYCHIATRIC DISORDERS  Depression Bipolar Disorder   negative neurological ROS     GI/Hepatic Neg liver ROS,,,Gastroparesis    Endo/Other  negative endocrine ROSdiabetes    Renal/GU Renal disease  negative genitourinary   Musculoskeletal  (+) Arthritis ,    Abdominal   Peds negative pediatric ROS (+)  Hematology  (+) Blood dyscrasia, anemia   Anesthesia Other Findings   Reproductive/Obstetrics negative OB ROS                             Anesthesia Physical Anesthesia Plan  ASA: 3  Anesthesia Plan: General   Post-op Pain Management:    Induction: Intravenous and Rapid sequence  PONV Risk Score and Plan: Ondansetron and Dexamethasone  Airway Management Planned: Oral ETT  Additional Equipment:   Intra-op Plan:   Post-operative Plan: Extubation in OR  Informed Consent: I have reviewed the patients History and Physical, chart, labs and discussed the procedure including the risks, benefits and alternatives for the proposed anesthesia with the patient or authorized representative who has indicated his/her understanding and acceptance.     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  CKD is followed by Dr. Valentino Nose at [facility].  Recent anesthesia note 06/11/2022 is marked as difficult airway and GlideScope was used electively.  Per note, "Difficulty Due To: Difficulty was anticipated and Difficult Airway- due to reduced neck mobility Future Recommendations: Recommend- induction with short-acting agent, and alternative techniques readily available Comments: Patient had posterior cervical fusion."  Patient will need day of surgery labs and evaluation.  EKG 04/18/2021: NSR.  Rate 98.  Nuclear stress 04/27/2021:   The study is normal. The study is low risk.   No ST deviation was noted.   Left ventricular function is normal. Nuclear stress EF: 56 %. The left ventricular ejection fraction is normal (55-65%). End diastolic cavity size is normal.   Prior study not available for comparison.   TTE 11/08/2016: - Left ventricle: The cavity size was normal. Systolic function was  normal. The estimated ejection fraction was in the range of 60%  to 65%. Wall motion was normal; there were no regional wall  motion abnormalities.   )        Anesthesia Quick Evaluation

## 2022-11-28 NOTE — Progress Notes (Signed)
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  CKD is followed by Dr. Valentino Nose at [facility].  Recent anesthesia note 06/11/2022 is marked as difficult airway and GlideScope was used electively.  Per note, "Difficulty Due To: Difficulty was anticipated and Difficult Airway- due to reduced neck mobility Future Recommendations: Recommend- induction with short-acting agent, and alternative techniques readily available Comments: Patient had posterior cervical fusion."  Patient will need day of surgery labs and evaluation.  EKG 04/18/2021: NSR.  Rate 98.  Nuclear stress 04/27/2021:   The study is normal. The study is low risk.   No ST deviation was noted.   Left ventricular function is normal. Nuclear stress EF: 56 %. The left ventricular ejection fraction is normal (55-65%). End diastolic cavity size is normal.   Prior study not available for comparison.   TTE 11/08/2016: - Left ventricle: The cavity size was normal. Systolic function was    normal. The estimated ejection fraction was in the range of 60%    to 65%. Wall motion was normal; there were no regional wall    motion abnormalities.     [place], [facility] Short Stay Center/Anesthesiology Phone [phone] 11/28/2022 10:07 AM

## 2022-11-28 NOTE — Progress Notes (Signed)
PCP -  Dr. Rodena Medin Cardiologist -  Leodis Sias  PPM/ICD - denies Device Orders - denies Rep Notified - denies  Chest x-ray - 07-14-21 EKG - 04-18-21 Stress Test -  ECHO - 11-08-16 Cardiac Cath -   CPAP - denies  GLP-1 -no  Fasting Blood Sugar - Patient states "between 140-160 Checks Blood Sugar /day Has used a Dexacom until recently but does other resources to check blood sugar  Made patient aware  not to take empagliflozin (JARDIANCE) today or tomorrow. (Take M-W-F)  Blood Thinner Instructions:  Denies Aspirin Instructions: n/a  ERAS Protcol - NPO  COVID TEST- n/a  Anesthesia review: yes cardiac hx  Patient verbally denies any shortness of breath, fever, cough and chest pain during phone call   -------------  SDW INSTRUCTIONS given:  Your procedure is scheduled on Sept. 12, 2024.  Report to [facility] Main Entrance "A" at 5:30 A.M., and check in at the Admitting office.  Call this number if you have problems the morning of surgery:  [phone]   Remember:  Do not eat or drink  after midnight the night before your surgery   Take these medicines the morning of surgery with A SIP OF WATER  amLODipine (NORVASC)  carvedilol (COREG)  gabapentin (NEURONTIN)   IF NEEDED acetaminophen (TYLENOL)    As of today, STOP taking any Aspirin (unless otherwise instructed by your surgeon) Aleve, Naproxen, Ibuprofen, Motrin, Advil, Goody's, BC's, all herbal medications, fish oil, and all vitamins.   WHAT DO I DO ABOUT MY DIABETES MEDICATION?   Do not take oral diabetes medicines (pills) the morning of surgery. empagliflozin (JARDIANCE) Made aware do not take today or tomorrow. (Takes medication M-W-F)  The day of surgery, do not take other diabetes injectables, including Byetta (exenatide), Bydureon (exenatide ER), Victoza (liraglutide), or Trulicity (dulaglutide).  If your CBG is greater than 220 mg/dL, you may take  of your sliding scale (correction) dose of  insulin.   HOW TO MANAGE YOUR DIABETES BEFORE AND AFTER SURGERY  Why is it important to control my blood sugar before and after surgery? Improving blood sugar levels before and after surgery helps healing and can limit problems. A way of improving blood sugar control is eating a healthy diet by:  Eating less sugar and carbohydrates  Increasing activity/exercise  Talking with your doctor about reaching your blood sugar goals High blood sugars (greater than 180 mg/dL) can raise your risk of infections and slow your recovery, so you will need to focus on controlling your diabetes during the weeks before surgery. Make sure that the doctor who takes care of your diabetes knows about your planned surgery including the date and location.  How do I manage my blood sugar before surgery? Check your blood sugar at least 4 times a day, starting 2 days before surgery, to make sure that the level is not too high or low.  Check your blood sugar the morning of your surgery when you wake up and every 2 hours until you get to the Short Stay unit.  If your blood sugar is less than 70 mg/dL, you will need to treat for low blood sugar: Do not take insulin. Treat a low blood sugar (less than 70 mg/dL) with  cup of clear juice (cranberry or apple), 4 glucose tablets, OR glucose gel. Recheck blood sugar in 15 minutes after treatment (to make sure it is greater than 70 mg/dL). If your blood sugar is not greater than 70 mg/dL on recheck,  call [phone] for further instructions. Report your blood sugar to the short stay nurse when you get to Short Stay.  If you are admitted to the hospital after surgery: Your blood sugar will be checked by the staff and you will probably be given insulin after surgery (instead of oral diabetes medicines) to make sure you have good blood sugar levels. The goal for blood sugar control after surgery is 80-180 mg/dL.                          Do not wear jewelry, make up, or  nail polish            Do not wear lotions, powders, perfumes/colognes, or deodorant.            Do not shave 48 hours prior to surgery.  Men may shave face and neck.            Do not bring valuables to the hospital.            [facility] is not responsible for any belongings or valuables.  Do NOT Smoke (Tobacco/Vaping) 24 hours prior to your procedure If you use a CPAP at night, you may bring all equipment for your overnight stay.   Contacts, glasses, dentures or bridgework may not be worn into surgery.      For patients admitted to the hospital, discharge time will be determined by your treatment team.   Patients discharged the day of surgery will not be allowed to drive home, and someone needs to stay with them for 24 hours.    Special instructions:   [facility]- Preparing For Surgery  Before surgery, you can play an important role. Because skin is not sterile, your skin needs to be as free of germs as possible. You can reduce the number of germs on your skin by washing with CHG (chlorahexidine gluconate) Soap before surgery.  CHG is an antiseptic cleaner which kills germs and bonds with the skin to continue killing germs even after washing.    Oral Hygiene is also important to reduce your risk of infection.  Remember - BRUSH YOUR TEETH THE MORNING OF SURGERY WITH YOUR REGULAR TOOTHPASTE  Please do not use if you have an allergy to CHG or antibacterial soaps. If your skin becomes reddened/irritated stop using the CHG.  Do not shave (including legs and underarms) for at least 48 hours prior to first CHG shower. It is OK to shave your face.  Please follow these instructions carefully.   Shower the NIGHT BEFORE SURGERY and the MORNING OF SURGERY with DIAL Soap.   Pat yourself dry with a CLEAN TOWEL.  Wear CLEAN PAJAMAS to bed the night before surgery  Place CLEAN SHEETS on your bed the night of your first shower and DO NOT SLEEP WITH PETS.   Day of Surgery: Please shower  morning of surgery  Wear Clean/Comfortable clothing the morning of surgery Do not apply any deodorants/lotions.   Remember to brush your teeth WITH YOUR REGULAR TOOTHPASTE.   Questions were answered. Patient verbalized understanding of instructions.

## 2022-11-29 ENCOUNTER — Ambulatory Visit (HOSPITAL_BASED_OUTPATIENT_CLINIC_OR_DEPARTMENT_OTHER): Payer: Medicaid Other | Admitting: Physician Assistant

## 2022-11-29 ENCOUNTER — Ambulatory Visit (HOSPITAL_COMMUNITY): Payer: Medicaid Other | Admitting: Physician Assistant

## 2022-11-29 ENCOUNTER — Ambulatory Visit (HOSPITAL_COMMUNITY): Payer: Medicaid Other | Admitting: Mental Health

## 2022-11-29 ENCOUNTER — Encounter (HOSPITAL_COMMUNITY): Payer: Self-pay

## 2022-11-29 ENCOUNTER — Encounter (HOSPITAL_COMMUNITY)
Admission: RE | Disposition: A | Discharge status: Home / Self Care | Payer: Self-pay | Source: Home / Self Care | Attending: Vascular Surgery

## 2022-11-29 ENCOUNTER — Telehealth (HOSPITAL_COMMUNITY): Payer: Self-pay | Admitting: Mental Health

## 2022-11-29 ENCOUNTER — Other Ambulatory Visit: Payer: Self-pay

## 2022-11-29 ENCOUNTER — Ambulatory Visit (HOSPITAL_COMMUNITY)
Admission: RE | Admit: 2022-11-29 | Discharge: 2022-11-29 | Disposition: A | Discharge status: Home / Self Care | Payer: Medicaid Other | Attending: Vascular Surgery | Admitting: Vascular Surgery

## 2022-11-29 ENCOUNTER — Encounter (HOSPITAL_COMMUNITY): Payer: Self-pay | Admitting: Vascular Surgery

## 2022-11-29 DIAGNOSIS — Z833 Family history of diabetes mellitus: Secondary | ICD-10-CM | POA: Insufficient documentation

## 2022-11-29 DIAGNOSIS — J45909 Unspecified asthma, uncomplicated: Secondary | ICD-10-CM | POA: Insufficient documentation

## 2022-11-29 DIAGNOSIS — E1122 Type 2 diabetes mellitus with diabetic chronic kidney disease: Secondary | ICD-10-CM | POA: Insufficient documentation

## 2022-11-29 DIAGNOSIS — M1990 Unspecified osteoarthritis, unspecified site: Secondary | ICD-10-CM | POA: Insufficient documentation

## 2022-11-29 DIAGNOSIS — D631 Anemia in chronic kidney disease: Secondary | ICD-10-CM | POA: Insufficient documentation

## 2022-11-29 DIAGNOSIS — K3184 Gastroparesis: Secondary | ICD-10-CM | POA: Diagnosis not present

## 2022-11-29 DIAGNOSIS — I120 Hypertensive chronic kidney disease with stage 5 chronic kidney disease or end stage renal disease: Secondary | ICD-10-CM | POA: Insufficient documentation

## 2022-11-29 DIAGNOSIS — E785 Hyperlipidemia, unspecified: Secondary | ICD-10-CM

## 2022-11-29 DIAGNOSIS — R531 Weakness: Secondary | ICD-10-CM | POA: Diagnosis not present

## 2022-11-29 DIAGNOSIS — E1143 Type 2 diabetes mellitus with diabetic autonomic (poly)neuropathy: Secondary | ICD-10-CM | POA: Insufficient documentation

## 2022-11-29 DIAGNOSIS — N185 Chronic kidney disease, stage 5: Secondary | ICD-10-CM | POA: Insufficient documentation

## 2022-11-29 DIAGNOSIS — F319 Bipolar disorder, unspecified: Secondary | ICD-10-CM | POA: Insufficient documentation

## 2022-11-29 HISTORY — PX: CAPD INSERTION: SHX5233

## 2022-11-29 LAB — POCT I-STAT, CHEM 8
BUN: 60 mg/dL — ABNORMAL HIGH (ref 6–20)
Calcium, Ion: 1.21 mmol/L (ref 1.15–1.40)
Chloride: 105 mmol/L (ref 98–111)
Creatinine, Ser: 4.9 mg/dL — ABNORMAL HIGH (ref 0.61–1.24)
Glucose, Bld: 119 mg/dL — ABNORMAL HIGH (ref 70–99)
HCT: 27 % — ABNORMAL LOW (ref 39.0–52.0)
Hemoglobin: 9.2 g/dL — ABNORMAL LOW (ref 13.0–17.0)
Potassium: 3.9 mmol/L (ref 3.5–5.1)
Sodium: 140 mmol/L (ref 135–145)
TCO2: 21 mmol/L — ABNORMAL LOW (ref 22–32)

## 2022-11-29 LAB — GLUCOSE, CAPILLARY
Glucose-Capillary: 122 mg/dL — ABNORMAL HIGH (ref 70–99)
Glucose-Capillary: 162 mg/dL — ABNORMAL HIGH (ref 70–99)

## 2022-11-29 SURGERY — LAPAROSCOPIC INSERTION CONTINUOUS AMBULATORY PERITONEAL DIALYSIS  (CAPD) CATHETER
Anesthesia: General | Site: Abdomen

## 2022-11-29 MED ORDER — 0.9 % SODIUM CHLORIDE (POUR BTL) OPTIME
TOPICAL | Status: DC | PRN
  Administered 2022-11-29: 1000 mL

## 2022-11-29 MED ORDER — DEXAMETHASONE SODIUM PHOSPHATE 10 MG/ML IJ SOLN
INTRAMUSCULAR | Status: DC | PRN
  Administered 2022-11-29: 5 mg via INTRAVENOUS

## 2022-11-29 MED ORDER — HEPARIN 6000 UNIT IRRIGATION SOLUTION
Status: AC
  Filled 2022-11-29: qty 500

## 2022-11-29 MED ORDER — SODIUM CHLORIDE 0.9 % IR SOLN
Status: DC | PRN
  Administered 2022-11-29: 1

## 2022-11-29 MED ORDER — DROPERIDOL 2.5 MG/ML IJ SOLN
INTRAMUSCULAR | Status: AC
  Filled 2022-11-29: qty 2

## 2022-11-29 MED ORDER — FENTANYL CITRATE (PF) 250 MCG/5ML IJ SOLN
INTRAMUSCULAR | Status: DC | PRN
  Administered 2022-11-29: 50 ug via INTRAVENOUS

## 2022-11-29 MED ORDER — PROPOFOL 10 MG/ML IV BOLUS
INTRAVENOUS | Status: DC | PRN
  Administered 2022-11-29: 160 mg via INTRAVENOUS

## 2022-11-29 MED ORDER — FENTANYL CITRATE (PF) 100 MCG/2ML IJ SOLN
25.0000 ug | INTRAMUSCULAR | Status: DC | PRN
  Administered 2022-11-29 (×2): 50 ug via INTRAVENOUS

## 2022-11-29 MED ORDER — OXYCODONE HCL 5 MG/5ML PO SOLN: 5.0000 mg | Freq: Once | ORAL | Status: DC | PRN

## 2022-11-29 MED ORDER — SUCCINYLCHOLINE CHLORIDE 200 MG/10ML IV SOSY
PREFILLED_SYRINGE | INTRAVENOUS | Status: DC | PRN
  Administered 2022-11-29: 160 mg via INTRAVENOUS

## 2022-11-29 MED ORDER — ROCURONIUM BROMIDE 10 MG/ML (PF) SYRINGE
PREFILLED_SYRINGE | INTRAVENOUS | Status: DC | PRN
  Administered 2022-11-29: 40 mg via INTRAVENOUS

## 2022-11-29 MED ORDER — DROPERIDOL 2.5 MG/ML IJ SOLN
0.6250 mg | Freq: Once | INTRAMUSCULAR | Status: AC | PRN
  Administered 2022-11-29: 0.625 mg via INTRAVENOUS

## 2022-11-29 MED ORDER — OXYCODONE-ACETAMINOPHEN 5-325 MG PO TABS: 1.0000 | ORAL_TABLET | Freq: Four times a day (QID) | ORAL | 0 refills | Status: DC | PRN

## 2022-11-29 MED ORDER — ORAL CARE MOUTH RINSE: 15.0000 mL | Freq: Once | OROMUCOSAL | Status: AC

## 2022-11-29 MED ORDER — SUGAMMADEX SODIUM 200 MG/2ML IV SOLN
INTRAVENOUS | Status: DC | PRN
  Administered 2022-11-29: 200 mg via INTRAVENOUS

## 2022-11-29 MED ORDER — CEFAZOLIN SODIUM-DEXTROSE 2-4 GM/100ML-% IV SOLN
2.0000 g | INTRAVENOUS | Status: AC
  Administered 2022-11-29: 2 g via INTRAVENOUS
  Filled 2022-11-29: qty 100

## 2022-11-29 MED ORDER — FENTANYL CITRATE (PF) 250 MCG/5ML IJ SOLN
INTRAMUSCULAR | Status: AC
  Filled 2022-11-29: qty 5

## 2022-11-29 MED ORDER — SODIUM CHLORIDE 0.9 % IV SOLN: INTRAVENOUS | Status: DC

## 2022-11-29 MED ORDER — MIDAZOLAM HCL 2 MG/2ML IJ SOLN
INTRAMUSCULAR | Status: AC
  Filled 2022-11-29: qty 2

## 2022-11-29 MED ORDER — FENTANYL CITRATE (PF) 100 MCG/2ML IJ SOLN
INTRAMUSCULAR | Status: AC
  Filled 2022-11-29: qty 2

## 2022-11-29 MED ORDER — CHLORHEXIDINE GLUCONATE 0.12 % MT SOLN
15.0000 mL | Freq: Once | OROMUCOSAL | Status: AC
  Administered 2022-11-29: 15 mL via OROMUCOSAL
  Filled 2022-11-29: qty 15

## 2022-11-29 MED ORDER — MIDAZOLAM HCL 2 MG/2ML IJ SOLN
INTRAMUSCULAR | Status: DC | PRN
  Administered 2022-11-29: 2 mg via INTRAVENOUS

## 2022-11-29 MED ORDER — ACETAMINOPHEN 10 MG/ML IV SOLN: 1000.0000 mg | Freq: Once | INTRAVENOUS | Status: DC | PRN

## 2022-11-29 MED ORDER — CHLORHEXIDINE GLUCONATE 4 % EX SOLN: 60.0000 mL | Freq: Once | CUTANEOUS | Status: DC

## 2022-11-29 MED ORDER — LIDOCAINE-EPINEPHRINE (PF) 1 %-1:200000 IJ SOLN
INTRAMUSCULAR | Status: AC
  Filled 2022-11-29: qty 30

## 2022-11-29 MED ORDER — PROPOFOL 10 MG/ML IV BOLUS
INTRAVENOUS | Status: AC
  Filled 2022-11-29: qty 20

## 2022-11-29 MED ORDER — LIDOCAINE 2% (20 MG/ML) 5 ML SYRINGE
INTRAMUSCULAR | Status: DC | PRN
  Administered 2022-11-29: 80 mg via INTRAVENOUS

## 2022-11-29 MED ORDER — ONDANSETRON HCL 4 MG/2ML IJ SOLN
INTRAMUSCULAR | Status: DC | PRN
  Administered 2022-11-29: 4 mg via INTRAVENOUS

## 2022-11-29 MED ORDER — OXYCODONE HCL 5 MG PO TABS: 5.0000 mg | ORAL_TABLET | Freq: Once | ORAL | Status: DC | PRN

## 2022-11-29 MED ORDER — EPHEDRINE SULFATE (PRESSORS) 50 MG/ML IJ SOLN
INTRAMUSCULAR | Status: DC | PRN
  Administered 2022-11-29: 10 mg via INTRAVENOUS

## 2022-11-29 SURGICAL SUPPLY — 59 items
ADAPTER TITANIUM MEDIONICS (MISCELLANEOUS) ×1 IMPLANT
ADH SKN CLS APL DERMABOND .7 (GAUZE/BANDAGES/DRESSINGS) ×1
ADPR DLYS CATH STRL LF DISP (MISCELLANEOUS) ×1
APL PRP STRL LF DISP 70% ISPRP (MISCELLANEOUS) ×1
APPLIER CLIP 5 13 M/L LIGAMAX5 (MISCELLANEOUS)
APR CLP MED LRG 5 ANG JAW (MISCELLANEOUS)
BAG DECANTER FOR FLEXI CONT (MISCELLANEOUS) ×1 IMPLANT
BIOPATCH RED 1 DISK 7.0 (GAUZE/BANDAGES/DRESSINGS) ×1 IMPLANT
BLADE CLIPPER SURG (BLADE) IMPLANT
BLADE SURG 11 STRL SS (BLADE) ×1 IMPLANT
CATH EXTENDED DIALYSIS (CATHETERS) IMPLANT
CHLORAPREP W/TINT 26 (MISCELLANEOUS) ×1 IMPLANT
CLIP APPLIE 5 13 M/L LIGAMAX5 (MISCELLANEOUS) IMPLANT
COVER SURGICAL LIGHT HANDLE (MISCELLANEOUS) ×1 IMPLANT
DERMABOND ADVANCED .7 DNX12 (GAUZE/BANDAGES/DRESSINGS) ×1 IMPLANT
DEVICE TROCAR PUNCTURE CLOSURE (ENDOMECHANICALS) ×1 IMPLANT
DRSG TEGADERM 4X4.75 (GAUZE/BANDAGES/DRESSINGS) ×3 IMPLANT
ELECT REM PT RETURN 9FT ADLT (ELECTROSURGICAL) ×1
ELECTRODE REM PT RTRN 9FT ADLT (ELECTROSURGICAL) ×1 IMPLANT
GAUZE SPONGE 4X4 12PLY STRL (GAUZE/BANDAGES/DRESSINGS) ×1 IMPLANT
GLOVE INDICATOR 6.5 STRL GRN (GLOVE) ×1 IMPLANT
GLOVE SURG UNDER LTX SZ7.5 (GLOVE) ×1 IMPLANT
GOWN STRL REUS W/ TWL LRG LVL3 (GOWN DISPOSABLE) ×2 IMPLANT
GOWN STRL REUS W/ TWL XL LVL3 (GOWN DISPOSABLE) ×1 IMPLANT
GOWN STRL REUS W/TWL LRG LVL3 (GOWN DISPOSABLE) ×2
GOWN STRL REUS W/TWL XL LVL3 (GOWN DISPOSABLE) ×1
GRASPER SUT TROCAR 14GX15 (MISCELLANEOUS) ×1 IMPLANT
IRRIG SUCT STRYKERFLOW 2 WTIP (MISCELLANEOUS)
IRRIGATION SUCT STRKRFLW 2 WTP (MISCELLANEOUS) IMPLANT
IV NS 1000ML (IV SOLUTION) ×1
IV NS 1000ML BAXH (IV SOLUTION) ×1 IMPLANT
KIT BASIN OR (CUSTOM PROCEDURE TRAY) ×1 IMPLANT
KIT TURNOVER KIT B (KITS) ×1 IMPLANT
NDL INSUFFLATION 14GA 120MM (NEEDLE) ×1 IMPLANT
NEEDLE INSUFFLATION 14GA 120MM (NEEDLE) ×1 IMPLANT
NS IRRIG 1000ML POUR BTL (IV SOLUTION) ×1 IMPLANT
PAD ARMBOARD 7.5X6 YLW CONV (MISCELLANEOUS) ×2 IMPLANT
POWDER SURGICEL 3.0 GRAM (HEMOSTASIS) IMPLANT
SCISSORS LAP 5X35 DISP (ENDOMECHANICALS) IMPLANT
SET CYSTO W/LG BORE CLAMP LF (SET/KITS/TRAYS/PACK) ×1 IMPLANT
SET EXT 12IN DIALYSIS STAY-SAF (MISCELLANEOUS) ×1 IMPLANT
SET TUBE SMOKE EVAC HIGH FLOW (TUBING) ×1 IMPLANT
SLEEVE Z-THREAD 5X100MM (TROCAR) ×2 IMPLANT
SPIKE FLUID TRANSFER (MISCELLANEOUS) ×1 IMPLANT
STYLET FALLER (MISCELLANEOUS) ×1 IMPLANT
STYLET FALLER MEDIONICS (MISCELLANEOUS) ×1 IMPLANT
SUT MNCRL AB 4-0 PS2 18 (SUTURE) ×1 IMPLANT
SUT PROLENE 0 SH 30 (SUTURE) ×2 IMPLANT
SUT SILK 0 TIES 10X30 (SUTURE) ×1 IMPLANT
SUT VICRYL 3 0 (SUTURE) IMPLANT
TAPE CLOTH SURG 4X10 WHT LF (GAUZE/BANDAGES/DRESSINGS) IMPLANT
TOWEL GREEN STERILE (TOWEL DISPOSABLE) ×1 IMPLANT
TOWEL GREEN STERILE FF (TOWEL DISPOSABLE) ×1 IMPLANT
TRAY LAPAROSCOPIC MC (CUSTOM PROCEDURE TRAY) ×1 IMPLANT
TROCAR 11X100 Z THREAD (TROCAR) IMPLANT
TROCAR 5MMX150MM (TROCAR) ×1 IMPLANT
TROCAR XCEL NON-BLD 5MMX100MML (ENDOMECHANICALS) ×1 IMPLANT
TROCAR Z-THREAD OPTICAL 5X100M (TROCAR) ×1 IMPLANT
WATER STERILE IRR 1000ML POUR (IV SOLUTION) ×1 IMPLANT

## 2022-11-29 NOTE — Interval H&P Note (Signed)
History and Physical Interval Note:  11/29/2022 7:24 AM  [name] [name]  has presented today for surgery, with the diagnosis of Chronic kidney disease, stage V.  The various methods of treatment have been discussed with the patient and family. After consideration of risks, benefits and other options for treatment, the patient has consented to  Procedure(s): LAPAROSCOPIC INSERTION CONTINUOUS AMBULATORY PERITONEAL DIALYSIS  (CAPD) CATHETER (N/A) POSSIBLE LAPAROSCOPIC OMENTOPEXY/ (N/A) as a surgical intervention.  The patient's history has been reviewed, patient examined, no change in status, stable for surgery.  I have reviewed the patient's chart and labs.  Questions were answered to the patient's satisfaction.     Leonie Douglas

## 2022-11-29 NOTE — Discharge Instructions (Addendum)
Peritoneal Dialysis Catheter Placement, Care After  Okay to flush catheter on 11/30/2022  The following information offers guidance on how to care for yourself after your procedure. Your health care provider may also give you more specific instructions. If you have problems or questions, contact your health care provider. What can I expect after the procedure? After the procedure, it is common to have some pain or discomfort in your abdomen and your incision area. You may need to wait 2 weeks after your procedure before you can start peritoneal dialysis treatment. If you need dialysis before that time, your health care provider may begin peritoneal dialysis treatment early or offer kidney dialysis treatments (hemodialysis) until you heal. Follow these instructions at home: Incision care  Follow instructions from your health care provider about how to take care of your incision or incisions. Make sure you: Wash your hands with soap and water for at least 20 seconds before and after you change your bandage (dressing). If soap and water are not available, use hand sanitizer. Change your dressing only as told by your health care provider. Your health care provider may tell you not to touch or change your dressing. Leave stitches (sutures), staples, skin glue, or adhesive strips in place. These skin closures may need to stay in place for 2 weeks or longer. If adhesive strip edges start to loosen and curl up, you may trim the loose edges. Do not remove adhesive strips completely unless your health care provider tells you to do that. Check your incision areas every day for signs of infection. If you were instructed not to touch or change your dressing, look at your dressing for signs of infection. Check for: Redness, swelling, or more pain. Fluid or blood. Warmth. Pus or a bad smell. Medicines Take over-the-counter and prescription medicines only as told by your health care provider.  Do not take  Tylenol while taking pain medication. Ask your health care provider if the medicine prescribed to you requires you to avoid driving or using machinery. Driving Do not drive or ride in a car until your health care provider approves. Your seat belt could move the catheter out of position or cause irritation by rubbing on your incision. Activity  Rest and limit your activity. Do not lift anything that is heavier than 10 lb (4.5 kg), or the limit that you are told, until your health care provider says that it is safe. Return to your normal activities as told by your health care provider. Ask your health care provider what activities are safe for you. Managing constipation Your condition may cause constipation. To prevent or treat constipation, you may need to: Drink enough fluid to keep your urine pale yellow. Take over-the-counter or prescription medicines. Eat foods that are high in fiber, such as beans, whole grains, and fresh fruits and vegetables. Limit foods that are high in fat and processed sugars, such as fried or sweet foods. General instructions Do not use any products that contain nicotine or tobacco. These products include cigarettes, chewing tobacco, and vaping devices, such as e-cigarettes. If you need help quitting, ask your health care provider. Follow instructions from your health care provider about eating or drinking restrictions. Do not take baths, swim, or use a hot tub until your health care provider approves. Ask your health care provider if you may take showers. You may only be allowed to take sponge baths. Wear loose-fitting clothing that keeps the catheter covered so that it cannot get caught on something. Keep  your catheter clean and dry. Keep all follow-up visits. This is important. Contact a health care provider if: You have a fever or chills. You have warmth, redness, swelling, or more pain around an incision. You have fluid or blood coming from an incision. You  have pus or a bad smell coming from an incision. You cannot eat or drink without vomiting. Get help right away if: You have problems breathing. You are confused. You have trouble speaking. You have severe pain in your abdomen that does not get better with treatment. You have bright red blood in your stool (feces), or your stool is dark black and looks like tar. These symptoms may represent a serious problem that is an emergency. Do not wait to see if the symptoms will go away. Get medical help right away. Call your local emergency services (911 in the [place] Do not drive yourself to the hospital. Summary After the procedure, it is common to have some pain or discomfort in your abdomen, your incision area, or both. You may have to wait 2 weeks after your procedure before you can start peritoneal dialysis treatment. Check your incision area every day for signs of infection. Get medical help right away if you have severe pain in your abdomen that does not get better with treatment. This information is not intended to replace advice given to you by your health care provider. Make sure you discuss any questions you have with your health care provider.

## 2022-11-29 NOTE — Anesthesia Procedure Notes (Signed)
Procedure Name: Intubation Date/Time: 11/29/2022 7:53 AM  Performed by: Sandie Ano, CRNAPre-anesthesia Checklist: Patient identified, Emergency Drugs available, Suction available and Patient being monitored Patient Re-evaluated:Patient Re-evaluated prior to induction Oxygen Delivery Method: Circle System Utilized Preoxygenation: Pre-oxygenation with 100% oxygen Induction Type: IV induction Ventilation: Mask ventilation without difficulty Laryngoscope Size: Mac and 4 Grade View: Grade I Tube type: Oral Number of attempts: 1 Airway Equipment and Method: Stylet and Oral airway Placement Confirmation: ETT inserted through vocal cords under direct vision, positive ETCO2 and breath sounds checked- equal and bilateral Secured at: 23 cm Tube secured with: Tape Dental Injury: Teeth and Oropharynx as per pre-operative assessment

## 2022-11-29 NOTE — Telephone Encounter (Signed)
Therapist sent link for tele-therapy session x 2. No response. Contacted pt via telephone; no answer. Left message to reschedule. NS Stephan Minister [facility]

## 2022-11-29 NOTE — Transfer of Care (Signed)
Immediate Anesthesia Transfer of Care Note  Patient: [name] [name]  Procedure(s) Performed: LAPAROSCOPIC INSERTION CONTINUOUS AMBULATORY PERITONEAL DIALYSIS  (CAPD) CATHETER POSSIBLE LAPAROSCOPIC OMENTOPEXY/  Patient Location: PACU  Anesthesia Type:General  Level of Consciousness: awake, oriented, and patient cooperative  Airway & Oxygen Therapy: Patient Spontanous Breathing and Patient connected to nasal cannula oxygen  Post-op Assessment: Report given to RN and Post -op Vital signs reviewed and stable  Post vital signs: Reviewed and stable  Last Vitals:  Vitals Value Taken Time  BP 135/80 11/29/22 0853  Temp    Pulse 65 11/29/22 0855  Resp 15 11/29/22 0855  SpO2 94 % 11/29/22 0855  Vitals shown include unfiled device data.  Last Pain:  Vitals:   11/29/22 0634  TempSrc:   PainSc: 0-No pain         Complications: No notable events documented.

## 2022-11-29 NOTE — Anesthesia Postprocedure Evaluation (Signed)
Anesthesia Post Note  Patient: [name] [name]  Procedure(s) Performed: LAPAROSCOPIC INSERTION CONTINUOUS AMBULATORY PERITONEAL DIALYSIS  (CAPD) CATHETER (Abdomen) LAPAROSCOPIC OMENTOPEXY/ (Abdomen)     Patient location during evaluation: PACU Anesthesia Type: General Level of consciousness: awake and alert Pain management: pain level controlled Vital Signs Assessment: post-procedure vital signs reviewed and stable Respiratory status: spontaneous breathing, nonlabored ventilation, respiratory function stable and patient connected to nasal cannula oxygen Cardiovascular status: blood pressure returned to baseline and stable Postop Assessment: no apparent nausea or vomiting Anesthetic complications: no   No notable events documented.  Last Vitals:  Vitals:   11/29/22 0930 11/29/22 0945  BP: (!) 149/81 (!) 154/87  Pulse: 66 67  Resp: 12 12  Temp:  36.4 C  SpO2: 99% 99%    Last Pain:  Vitals:   11/29/22 0900  TempSrc:   PainSc: 0-No pain                 Afton Nation

## 2022-11-29 NOTE — Op Note (Signed)
DATE OF SERVICE: 11/29/2022  PATIENT:  [name] [name]  37 y.o. male  PRE-OPERATIVE DIAGNOSIS:  CKD 5  POST-OPERATIVE DIAGNOSIS:  Same  PROCEDURE:   1) laparoscopic omentopexy 2) laparoscopic peritoneal dialysis catheter placement  SURGEON:  Surgeons and Role:    * Leonie Douglas, MD - Primary  ASSISTANT: Doreatha Massed, PA-C  An experienced assistant was required given the complexity of this procedure and the standard of surgical care. My assistant helped with exposure through counter tension, suctioning, ligation and retraction to better visualize the surgical field.  My assistant expedited sewing during the case by following my sutures. Wherever I use the term "we" in the report, my assistant actively helped me with that portion of the procedure.  ANESTHESIA:   general  EBL: minimal  BLOOD ADMINISTERED:none  DRAINS:  PD catheter to left abdomen    LOCAL MEDICATIONS USED:  NONE  SPECIMEN:  none  COUNTS: confirmed correct.  TOURNIQUET:  none  PATIENT DISPOSITION:  PACU - hemodynamically stable.   Delay start of Pharmacological VTE agent (>24hrs) due to surgical blood loss or risk of bleeding: no  INDICATION FOR PROCEDURE: [name] [name] is a 37 y.o. male with chronic kidney disease, stage 5. He is in need of permanent dialysis access. After careful discussion of risks, benefits, and alternatives the patient was offered peritoneal dialysis catheter. The patient understood and wished to proceed.  OPERATIVE FINDINGS: successful PD catheter placement. Successful omentopexy. Catheter flushing and draining in operating room  DESCRIPTION OF PROCEDURE: After identification of the patient in the pre-operative holding area, the patient was transferred to the operating room. The patient was positioned supine on the operating room table. Anesthesia was induced. The abdomen was prepped and draped in standard fashion. A surgical pause was performed confirming correct patient, procedure, and  operative location.  A Veress needle was introduced into the abdomen at Palmer's point, immediately below the left costal margin.  A saline drop test was used to confirm intra-abdominal position.  The Veress needle was connected to insufflation tubing and insufflation initiated.  A low opening pressure and good flow rate were noted.  A 5 mm trocar was introduced into the right upper quadrant using Visi-View technique.  The obturator was removed and the abdomen inspected with a 30 degree angled laparoscope.  No evidence of Veress needle injury was noted.  An additional 5 mm trocar was inserted a handsbreadth away to facilitate the case.  The omentum was grasped with an atraumatic grasper and elevated to the upper abdomen.  A laparoscopic suture passer was used to deliver a 0 Prolene suture through the omentum.  The suture was secured down to the anterior abdominal wall with a knot.  The omentum was pexied in place.  The abdomen was desufflated.  The peritoneal catheter was measured across the abdominal wall surface.  A mark was made by the cuff in the periumbilical abdomen.  The abdomen was reinsufflated.  An advantage 5 mm trocar was then used to create a skiving path through the anterior rectus fascia, rectus muscle, and peritoneum.  The laparoscopic trocar entered in the suprapubic abdomen directing towards the pelvis.  The working end of the catheter was delivered through the trocar.  The cuff was brought into the abdomen and then placed back into the rectus muscle.  The abdomen was desufflated again.  A "swan-neck" extension tubing for the dialysis catheter was brought onto the field.  The catheter was laid across the desufflated abdomen to lay across the  upper quadrant and exit the left abdomen.  The catheter was cut.  The 2 pieces of catheter were then connected using a Christmas tree adapter.  This was secured in place with 2 interrupted sutures of 0 Prolene.  The connected catheter was then tunneled  to a counterincision in the upper quadrant and then out the lateral abdomen in a downward deflection.  Great care was taken to avoid twisting or kinking the catheter.  The abdomen was reinsufflated.  The peritoneum was inspected to ensure the catheter did not enter the cavity.  Satisfied we desufflated the abdomen.  The catheter was connected to cystoscopy tubing.  The catheter flushed and drained without any difficulty.  The trochars were removed.  All incisions were closed with interrupted 4-0 Monocryl sutures.  Dermabond was applied.  A sterile bandage was applied to the peritoneal dialysis catheter.   Upon completion of the case instrument and sharps counts were confirmed correct. The patient was transferred to the PACU in good condition. I was present for all portions of the procedure.  FOLLOW UP PLAN: the catheter can be flushed tomorrow. Follow up with me as needed for any catheter   [name] [name]. [name] Antu, MD [facility] Phone Number: [phone] 11/29/2022 8:45 AM

## 2022-11-30 ENCOUNTER — Other Ambulatory Visit: Payer: Self-pay | Admitting: Internal Medicine

## 2022-11-30 ENCOUNTER — Encounter (HOSPITAL_COMMUNITY): Payer: Self-pay | Admitting: Vascular Surgery

## 2022-12-17 ENCOUNTER — Emergency Department (HOSPITAL_COMMUNITY): Payer: Medicaid Other

## 2022-12-17 ENCOUNTER — Other Ambulatory Visit: Payer: Self-pay | Admitting: Internal Medicine

## 2022-12-17 ENCOUNTER — Inpatient Hospital Stay (HOSPITAL_COMMUNITY)
Admission: EM | Admit: 2022-12-17 | Discharge: 2022-12-22 | DRG: 907 | Disposition: A | Discharge status: Home / Self Care | Payer: Medicaid Other | Attending: Internal Medicine | Admitting: Internal Medicine

## 2022-12-17 DIAGNOSIS — E11319 Type 2 diabetes mellitus with unspecified diabetic retinopathy without macular edema: Secondary | ICD-10-CM | POA: Diagnosis present

## 2022-12-17 DIAGNOSIS — F319 Bipolar disorder, unspecified: Secondary | ICD-10-CM | POA: Diagnosis present

## 2022-12-17 DIAGNOSIS — Z89412 Acquired absence of left great toe: Secondary | ICD-10-CM

## 2022-12-17 DIAGNOSIS — Z79899 Other long term (current) drug therapy: Secondary | ICD-10-CM

## 2022-12-17 DIAGNOSIS — Z833 Family history of diabetes mellitus: Secondary | ICD-10-CM

## 2022-12-17 DIAGNOSIS — Z8042 Family history of malignant neoplasm of prostate: Secondary | ICD-10-CM

## 2022-12-17 DIAGNOSIS — Z6837 Body mass index (BMI) 37.0-37.9, adult: Secondary | ICD-10-CM

## 2022-12-17 DIAGNOSIS — T8571XA Infection and inflammatory reaction due to peritoneal dialysis catheter, initial encounter: Principal | ICD-10-CM | POA: Diagnosis present

## 2022-12-17 DIAGNOSIS — Z801 Family history of malignant neoplasm of trachea, bronchus and lung: Secondary | ICD-10-CM

## 2022-12-17 DIAGNOSIS — I120 Hypertensive chronic kidney disease with stage 5 chronic kidney disease or end stage renal disease: Secondary | ICD-10-CM | POA: Diagnosis present

## 2022-12-17 DIAGNOSIS — Z820 Family history of epilepsy and other diseases of the nervous system: Secondary | ICD-10-CM

## 2022-12-17 DIAGNOSIS — Z794 Long term (current) use of insulin: Secondary | ICD-10-CM

## 2022-12-17 DIAGNOSIS — M898X9 Other specified disorders of bone, unspecified site: Secondary | ICD-10-CM | POA: Diagnosis present

## 2022-12-17 DIAGNOSIS — L03311 Cellulitis of abdominal wall: Principal | ICD-10-CM | POA: Diagnosis present

## 2022-12-17 DIAGNOSIS — Z800 Family history of malignant neoplasm of digestive organs: Secondary | ICD-10-CM

## 2022-12-17 DIAGNOSIS — K3184 Gastroparesis: Secondary | ICD-10-CM | POA: Diagnosis present

## 2022-12-17 DIAGNOSIS — L97519 Non-pressure chronic ulcer of other part of right foot with unspecified severity: Secondary | ICD-10-CM | POA: Diagnosis present

## 2022-12-17 DIAGNOSIS — Z7984 Long term (current) use of oral hypoglycemic drugs: Secondary | ICD-10-CM

## 2022-12-17 DIAGNOSIS — Z992 Dependence on renal dialysis: Secondary | ICD-10-CM

## 2022-12-17 DIAGNOSIS — E785 Hyperlipidemia, unspecified: Secondary | ICD-10-CM | POA: Diagnosis present

## 2022-12-17 DIAGNOSIS — E1143 Type 2 diabetes mellitus with diabetic autonomic (poly)neuropathy: Secondary | ICD-10-CM | POA: Diagnosis present

## 2022-12-17 DIAGNOSIS — N186 End stage renal disease: Secondary | ICD-10-CM | POA: Diagnosis present

## 2022-12-17 DIAGNOSIS — Z8249 Family history of ischemic heart disease and other diseases of the circulatory system: Secondary | ICD-10-CM

## 2022-12-17 DIAGNOSIS — E11621 Type 2 diabetes mellitus with foot ulcer: Secondary | ICD-10-CM | POA: Diagnosis present

## 2022-12-17 DIAGNOSIS — E1122 Type 2 diabetes mellitus with diabetic chronic kidney disease: Secondary | ICD-10-CM | POA: Diagnosis present

## 2022-12-17 DIAGNOSIS — K659 Peritonitis, unspecified: Secondary | ICD-10-CM | POA: Diagnosis present

## 2022-12-17 DIAGNOSIS — Y828 Other medical devices associated with adverse incidents: Secondary | ICD-10-CM | POA: Diagnosis present

## 2022-12-17 DIAGNOSIS — J45909 Unspecified asthma, uncomplicated: Secondary | ICD-10-CM | POA: Diagnosis present

## 2022-12-17 DIAGNOSIS — F1290 Cannabis use, unspecified, uncomplicated: Secondary | ICD-10-CM | POA: Diagnosis present

## 2022-12-17 DIAGNOSIS — D509 Iron deficiency anemia, unspecified: Secondary | ICD-10-CM | POA: Diagnosis present

## 2022-12-17 DIAGNOSIS — D631 Anemia in chronic kidney disease: Secondary | ICD-10-CM | POA: Diagnosis present

## 2022-12-17 DIAGNOSIS — M1990 Unspecified osteoarthritis, unspecified site: Secondary | ICD-10-CM | POA: Diagnosis present

## 2022-12-17 LAB — CBC WITH DIFFERENTIAL/PLATELET
Abs Immature Granulocytes: 0.09 10*3/uL — ABNORMAL HIGH (ref 0.00–0.07)
Basophils Absolute: 0.1 10*3/uL (ref 0.0–0.1)
Basophils Relative: 1 %
Eosinophils Absolute: 0.8 10*3/uL — ABNORMAL HIGH (ref 0.0–0.5)
Eosinophils Relative: 5 %
HCT: 27.3 % — ABNORMAL LOW (ref 39.0–52.0)
Hemoglobin: 8.7 g/dL — ABNORMAL LOW (ref 13.0–17.0)
Immature Granulocytes: 1 %
Lymphocytes Relative: 9 %
Lymphs Abs: 1.4 10*3/uL (ref 0.7–4.0)
MCH: 29.4 pg (ref 26.0–34.0)
MCHC: 31.9 g/dL (ref 30.0–36.0)
MCV: 92.2 fL (ref 80.0–100.0)
Monocytes Absolute: 1.1 10*3/uL — ABNORMAL HIGH (ref 0.1–1.0)
Monocytes Relative: 7 %
Neutro Abs: 11.9 10*3/uL — ABNORMAL HIGH (ref 1.7–7.7)
Neutrophils Relative %: 77 %
Platelets: 246 10*3/uL (ref 150–400)
RBC: 2.96 MIL/uL — ABNORMAL LOW (ref 4.22–5.81)
RDW: 12.9 % (ref 11.5–15.5)
WBC: 15.3 10*3/uL — ABNORMAL HIGH (ref 4.0–10.5)
nRBC: 0 % (ref 0.0–0.2)

## 2022-12-17 LAB — COMPREHENSIVE METABOLIC PANEL
ALT: 12 U/L (ref 0–44)
AST: 8 U/L — ABNORMAL LOW (ref 15–41)
Albumin: 3.1 g/dL — ABNORMAL LOW (ref 3.5–5.0)
Alkaline Phosphatase: 66 U/L (ref 38–126)
Anion gap: 13 (ref 5–15)
BUN: 65 mg/dL — ABNORMAL HIGH (ref 6–20)
CO2: 19 mmol/L — ABNORMAL LOW (ref 22–32)
Calcium: 8.9 mg/dL (ref 8.9–10.3)
Chloride: 106 mmol/L (ref 98–111)
Creatinine, Ser: 4.28 mg/dL — ABNORMAL HIGH (ref 0.61–1.24)
GFR, Estimated: 17 mL/min — ABNORMAL LOW (ref >60)
Glucose, Bld: 107 mg/dL — ABNORMAL HIGH (ref 70–99)
Potassium: 4.1 mmol/L (ref 3.5–5.1)
Sodium: 138 mmol/L (ref 135–145)
Total Bilirubin: 0.6 mg/dL (ref 0.3–1.2)
Total Protein: 6.9 g/dL (ref 6.5–8.1)

## 2022-12-17 LAB — LIPASE, BLOOD: Lipase: 33 U/L (ref 11–51)

## 2022-12-17 MED ORDER — ONDANSETRON 4 MG PO TBDP
8.0000 mg | ORAL_TABLET | Freq: Once | ORAL | Status: AC
  Administered 2022-12-17: 8 mg via ORAL
  Filled 2022-12-17: qty 2

## 2022-12-17 MED ORDER — HYDROCODONE-ACETAMINOPHEN 5-325 MG PO TABS
1.0000 | ORAL_TABLET | Freq: Once | ORAL | Status: AC
  Administered 2022-12-17: 1 via ORAL
  Filled 2022-12-17: qty 1

## 2022-12-17 NOTE — ED Triage Notes (Addendum)
Pt BIB [facility] from home. Pt had peritoneal dialysis shunt placed two weeks ago. Yesterday pt started to have lower abdominal  pain. Pain reported 8/10 pain so EMS gave 100 mcg of fentanyl.   EMS VS P 90 O2 97% CBG 111 18 LAC

## 2022-12-17 NOTE — ED Provider Triage Note (Signed)
Emergency Medicine Provider Triage Evaluation Note  [name] [name] , a 37 y.o. male  was evaluated in triage.  Pt complains of right lower quadrant abdominal pain.  Patient states that symptoms began after awakening this morning.  Reports nausea with 1 episode of emesis.  Denies radiation of pain.  Also states that yesterday, noticed a red area on his left side of abdomen that was causing him some discomfort.  Patient with history of CKD just recently getting set up for peritoneal dialysis.  States that his first session is tomorrow.  Denies any fever, chills, urinary symptoms, change in bowel habits..  Review of Systems  Positive: See above Negative:   Physical Exam  There were no vitals taken for this visit. Gen:   Awake, no distress   Resp:  Normal effort  MSK:   Moves extremities without difficulty  Other:  Right lower quadrant abdominal tenderness  Medical Decision Making  Medically screening exam initiated at 8:45 PM.  Appropriate orders placed.  [name] [name] was informed that the remainder of the evaluation will be completed by another provider, this initial triage assessment does not replace that evaluation, and the importance of remaining in the ED until their evaluation is complete.     [name] [name], [place] 12/17/22 2046

## 2022-12-18 ENCOUNTER — Other Ambulatory Visit: Payer: Self-pay

## 2022-12-18 ENCOUNTER — Encounter (HOSPITAL_COMMUNITY): Payer: Self-pay

## 2022-12-18 DIAGNOSIS — L03311 Cellulitis of abdominal wall: Secondary | ICD-10-CM

## 2022-12-18 LAB — HEMOGLOBIN A1C
Hgb A1c MFr Bld: 6.5 % — ABNORMAL HIGH (ref 4.8–5.6)
Mean Plasma Glucose: 139.85 mg/dL

## 2022-12-18 LAB — URINALYSIS, ROUTINE W REFLEX MICROSCOPIC
Bilirubin Urine: NEGATIVE
Glucose, UA: 150 mg/dL — AB
Hgb urine dipstick: NEGATIVE
Ketones, ur: NEGATIVE mg/dL
Leukocytes,Ua: NEGATIVE
Nitrite: NEGATIVE
Protein, ur: 100 mg/dL — AB
Specific Gravity, Urine: 1.01 (ref 1.005–1.030)
pH: 5 (ref 5.0–8.0)

## 2022-12-18 LAB — GLUCOSE, CAPILLARY
Glucose-Capillary: 170 mg/dL — ABNORMAL HIGH (ref 70–99)
Glucose-Capillary: 89 mg/dL (ref 70–99)
Glucose-Capillary: 216 mg/dL — ABNORMAL HIGH (ref 70–99)

## 2022-12-18 LAB — CBG MONITORING, ED: Glucose-Capillary: 119 mg/dL — ABNORMAL HIGH (ref 70–99)

## 2022-12-18 LAB — ALBUMIN: Albumin: 2.9 g/dL — ABNORMAL LOW (ref 3.5–5.0)

## 2022-12-18 LAB — PHOSPHORUS: Phosphorus: 3.6 mg/dL (ref 2.5–4.6)

## 2022-12-18 LAB — HIV ANTIBODY (ROUTINE TESTING W REFLEX): HIV Screen 4th Generation wRfx: NONREACTIVE

## 2022-12-18 MED ORDER — HYDRALAZINE HCL 20 MG/ML IJ SOLN: 10.0000 mg | Freq: Four times a day (QID) | INTRAMUSCULAR | Status: DC | PRN

## 2022-12-18 MED ORDER — SODIUM CHLORIDE 0.9 % IV SOLN
1.0000 g | INTRAVENOUS | Status: DC
  Administered 2022-12-18 – 2022-12-21 (×4): 1 g via INTRAVENOUS
  Filled 2022-12-18 (×5): qty 1

## 2022-12-18 MED ORDER — ALBUTEROL SULFATE (2.5 MG/3ML) 0.083% IN NEBU: 2.5000 mg | INHALATION_SOLUTION | Freq: Four times a day (QID) | RESPIRATORY_TRACT | Status: DC | PRN

## 2022-12-18 MED ORDER — INSULIN ASPART 100 UNIT/ML IJ SOLN
0.0000 [IU] | Freq: Every day | INTRAMUSCULAR | Status: DC
  Administered 2022-12-18 – 2022-12-22 (×2): 2 [IU] via SUBCUTANEOUS

## 2022-12-18 MED ORDER — ACETAMINOPHEN 325 MG PO TABS
650.0000 mg | ORAL_TABLET | Freq: Four times a day (QID) | ORAL | Status: DC | PRN
  Administered 2022-12-19: 650 mg via ORAL
  Filled 2022-12-18: qty 2

## 2022-12-18 MED ORDER — FERROUS SULFATE 325 (65 FE) MG PO TABS
325.0000 mg | ORAL_TABLET | Freq: Every day | ORAL | Status: DC
  Administered 2022-12-19 – 2022-12-22 (×4): 325 mg via ORAL
  Filled 2022-12-18 (×4): qty 1

## 2022-12-18 MED ORDER — ALBUTEROL SULFATE (2.5 MG/3ML) 0.083% IN NEBU: 2.5000 mg | INHALATION_SOLUTION | Freq: Four times a day (QID) | RESPIRATORY_TRACT | Status: DC

## 2022-12-18 MED ORDER — SENNA 8.6 MG PO TABS
1.0000 | ORAL_TABLET | Freq: Two times a day (BID) | ORAL | Status: DC
  Administered 2022-12-18 – 2022-12-20 (×4): 8.6 mg via ORAL
  Filled 2022-12-18 (×8): qty 1

## 2022-12-18 MED ORDER — SODIUM CHLORIDE 0.9 % IV SOLN: INTRAVENOUS | Status: AC

## 2022-12-18 MED ORDER — GABAPENTIN 300 MG PO CAPS
300.0000 mg | ORAL_CAPSULE | Freq: Two times a day (BID) | ORAL | Status: DC
  Administered 2022-12-18 – 2022-12-22 (×9): 300 mg via ORAL
  Filled 2022-12-18 (×9): qty 1

## 2022-12-18 MED ORDER — ACETAMINOPHEN 650 MG RE SUPP: 650.0000 mg | Freq: Four times a day (QID) | RECTAL | Status: DC | PRN

## 2022-12-18 MED ORDER — INSULIN ASPART 100 UNIT/ML IJ SOLN
0.0000 [IU] | Freq: Three times a day (TID) | INTRAMUSCULAR | Status: DC
  Administered 2022-12-18 – 2022-12-19 (×2): 2 [IU] via SUBCUTANEOUS
  Administered 2022-12-19: 3 [IU] via SUBCUTANEOUS
  Administered 2022-12-20: 2 [IU] via SUBCUTANEOUS
  Administered 2022-12-20: 3 [IU] via SUBCUTANEOUS
  Administered 2022-12-20 – 2022-12-22 (×3): 2 [IU] via SUBCUTANEOUS

## 2022-12-18 MED ORDER — HYDROMORPHONE HCL 1 MG/ML IJ SOLN
0.5000 mg | INTRAMUSCULAR | Status: AC | PRN
  Administered 2022-12-18 – 2022-12-19 (×3): 0.5 mg via INTRAVENOUS
  Filled 2022-12-18 (×3): qty 0.5

## 2022-12-18 MED ORDER — AMLODIPINE BESYLATE 5 MG PO TABS
5.0000 mg | ORAL_TABLET | Freq: Every day | ORAL | Status: DC
  Administered 2022-12-19 – 2022-12-22 (×4): 5 mg via ORAL
  Filled 2022-12-18 (×4): qty 1

## 2022-12-18 MED ORDER — HEPARIN SODIUM (PORCINE) 5000 UNIT/ML IJ SOLN
5000.0000 [IU] | Freq: Three times a day (TID) | INTRAMUSCULAR | Status: DC
  Administered 2022-12-18 – 2022-12-22 (×10): 5000 [IU] via SUBCUTANEOUS
  Filled 2022-12-18 (×10): qty 1

## 2022-12-18 MED ORDER — CARVEDILOL 6.25 MG PO TABS
6.2500 mg | ORAL_TABLET | Freq: Two times a day (BID) | ORAL | Status: DC
  Administered 2022-12-18 – 2022-12-22 (×8): 6.25 mg via ORAL
  Filled 2022-12-18 (×8): qty 1

## 2022-12-18 MED ORDER — DOCUSATE SODIUM 100 MG PO CAPS
100.0000 mg | ORAL_CAPSULE | Freq: Two times a day (BID) | ORAL | Status: DC
  Administered 2022-12-18 – 2022-12-20 (×5): 100 mg via ORAL
  Filled 2022-12-18 (×8): qty 1

## 2022-12-18 NOTE — H&P (Signed)
[facility] History and Physical  Acencion Toni AOZ:[id number] DOB: August 29, 1985 DOA: 12/17/2022 PCP: Marcine Matar, MD  Presented from: Home Chief Complaint: Abdominal pain  History of Present Illness: [name] [name] is a 37 y.o. male with PMH significant for ESRD on PD, DM 2, HTN, chronic anemia, depression, bipolar disorder who recently underwent a PD catheter placed by vascular surgery Dr. Lenell Antu on 9/12 Patient was brought to the ED from home by EMS last night for lower abdominal pain 8/10.  EMS gave 100 mcg of fentanyl on the way.  Patient reports onset of abdominal pain 1 to 2 days ago which had progressively worsened and generalized.  No fever, nausea vomiting, diarrhea.  In the ED, patient was afebrile, hemodynamically, breathing on room air Labs with WC count 15.3, hemoglobin 8.7, platelet 246, BUN/creatinine 65/4.28 CT abdomen pelvis showed cellulitis and a small volume fluid at PD catheter access site.  No evidence of organized abscess.  It also showed nonspecific perinephric fat stranding. Some hazy stranding about the renal pelvises also nonspecific but correlate for upper urinary tract infection. Urinalysis showed clear straw-colored urine with negative leukocytes, rare bacteria Blood culture was sent. Hospitalist service was consulted for inpatient admission and management  I received this patient as a carryover admission from last night At the time of my evaluation this morning, nephrologist Dr. Arlean Hopping was also at bedside.  Dialysis RN was trying to get peritoneal fluid sample for analysis. Antibiotics on hold till fluid is collected. Patient was in mild to moderate distress because of abdominal pain.  History reviewed and detailed as above.  Review of Systems:  All systems were reviewed and were negative unless otherwise mentioned in the HPI   Past medical history: Past Medical History:  Diagnosis Date   Anemia    Arthritis    "neck; maybe in my fingers"  (11/06/2016)   Asthma    Bipolar disorder (HCC)    "IED: intentional deficit disorder"   Chronic kidney disease (CKD), stage III (moderate) (HCC)    Depression    Heart murmur    "when I was a child"   Hypertension    Type II diabetes mellitus (HCC)     Past surgical history: Past Surgical History:  Procedure Laterality Date   AMPUTATION Left 11/05/2016   Procedure: AMPUTATION LEFT GREAT TOE;  Surgeon: Nadara Mustard, MD;  Location: [facility];  Service: Orthopedics;  Laterality: Left;   BIOPSY  06/11/2022   Procedure: BIOPSY;  Surgeon: Shellia Cleverly, DO;  Location: [facility];  Service: Gastroenterology;;   CAPD INSERTION N/A 11/29/2022   Procedure: LAPAROSCOPIC INSERTION CONTINUOUS AMBULATORY PERITONEAL DIALYSIS  (CAPD) CATHETER;  Surgeon: Leonie Douglas, MD;  Location: [facility] OR;  Service: Vascular;  Laterality: N/A;   CATARACT EXTRACTION Left 07/2020   DRESSING CHANGE UNDER ANESTHESIA N/A 04/13/2016   Procedure: DRESSING CHANGE UNDER ANESTHESIA;  Surgeon: Luretha Murphy, MD;  Location: [facility];  Service: General;  Laterality: N/A;   ESOPHAGOGASTRODUODENOSCOPY (EGD) WITH PROPOFOL N/A 06/11/2022   Procedure: ESOPHAGOGASTRODUODENOSCOPY (EGD) WITH PROPOFOL;  Surgeon: Shellia Cleverly, DO;  Location: [facility];  Service: Gastroenterology;  Laterality: N/A;   INCISION AND DRAINAGE ABSCESS Right 09/03/2015   Procedure: INCISION AND DRAINAGE ABSCESS;  Surgeon: Dominica Severin, MD;  Location: [facility];  Service: Orthopedics;  Laterality: Right;   INCISION AND DRAINAGE PERIRECTAL ABSCESS N/A 04/11/2016   Procedure: IRRIGATION AND DEBRIDEMENT RECTAL ABSCESS;  Surgeon: Luretha Murphy, MD;  Location: [facility];  Service: General;  Laterality: N/A;  INGUINAL HERNIA REPAIR  1988; 1989   x2    IRRIGATION AND DEBRIDEMENT ABSCESS Left 08/31/2015   Procedure: IRRIGATION AND DEBRIDEMENT ABSCESS LEFT SHOULDER;  Surgeon: Ovidio Kin, MD;  Location: [facility];  Service: General;  Laterality: Left;   RETINAL  LASER PROCEDURE Bilateral    TUMOR EXCISION     neck   WOUND DEBRIDEMENT N/A 02/24/2016   Procedure: SACRAL WOUND DEBRIDEMENT;  Surgeon: Violeta Gelinas, MD;  Location: [facility];  Service: General;  Laterality: N/A;    Social History:  reports that he has never smoked. He has never used smokeless tobacco. He reports current alcohol use. He reports current drug use. Drug: Marijuana.  Allergies:  No Known Allergies Patient has no known allergies.   Family history:  Family History  Problem Relation Age of Onset   Diabetes type II Mother    Coronary artery disease Mother    Diabetes type II Father    Colon cancer Maternal Grandfather    Lung cancer Maternal Grandfather    Prostate cancer Maternal Grandfather    Diabetes Maternal Grandfather    Hypertension Maternal Grandfather    Heart disease Paternal Grandfather    Parkinson's disease Paternal Aunt    Multiple sclerosis Paternal Aunt      Physical Exam: Vitals:   12/18/22 0530 12/18/22 0600 12/18/22 0716 12/18/22 0900  BP: 119/78 134/73  (!) 136/114  Pulse: 74 (!) 56  81  Resp:    20  Temp:   98.5 F (36.9 C)   SpO2: 97% 97%  100%  Weight:      Height:       Wt Readings from Last 3 Encounters:  12/18/22 112 kg  11/29/22 112.5 kg  11/06/22 108 kg   Body mass index is 37.54 kg/m.  General exam: Pleasant, young male.  Mild to moderate distress because of abdominal pain  Skin: No rashes, lesions or ulcers. HEENT: Atraumatic, normocephalic, no obvious bleeding Lungs: Clear to auscultation bilaterally CVS: Regular rate and rhythm, no murmur GI/Abd abdominal firmness, tenderness guarding and rigidity noted. CNS: Alert, awake, alert x 3 Psychiatry: In pain, tearful Extremities: No pedal edema, no calf tenderness   ------------------------------------------------------------------------------------------------------ Assessment/Plan: Principal Problem:   Cellulitis of abdominal wall  Suspect peritonitis PD catheter  site cellulitis Presented with progressive abdominal pain and cellulitis at the site of recently PD No fever but has leukocytosis and significant abdominal tenderness Nephrology on board.  Pending peritoneal fluid collection.  To be initiated on antibiotics after fluid collection. Continue to monitor blood work. For pain control, continue as needed Dilaudid Recent Labs  Lab 12/17/22 2045  WBC 15.3*   ESRD on PD Nephrology following   Type 2 diabetes mellitus Diabetic neuropathy A1c 6.9 in April 2024 PTA meds-on Jardiance MWF Start SSI/Accu-Cheks Continue gabapentin Lab Results  Component Value Date   HGBA1C 6.9 06/25/2022   Recent Labs  Lab 12/18/22 0732  GLUCAP 119*   Hypertension PTA meds- carvedilol 25 mg twice daily, chlorthalidone 12.5 mg daily, amlodipine 10 mg daily, valsartan 40 mg daily, Jardiance 10 mg MWF.  Home med list not updated yet. Resume carvedilol and amlodipine at low-dose Continue to monitor blood  HLD Pravastatin to resume  Chronic anemia Chronically low hemoglobin due to iron deficiency and CKD  Recent Labs    06/25/22 1627 11/29/22 0631 12/17/22 2045  HGB 9.7* 9.2* 8.7*  MCV 90  --  92.2  FERRITIN 66  --   --   TIBC 258  --   --  IRON 43  --   --    depression, bipolar disorder Not on meds??  Mobility: Encourage ambulation  Goals of care   Code Status: Full Code    DVT prophylaxis:  heparin injection 5,000 Units Start: 12/18/22 0830   Antimicrobials: Broad-spectrum antibiotics after fluid collected Fluid: None Consultants: Nephrology, vascular surgery Family Communication: None at bedside  Dispo: The patient is from: Home              Anticipated d/c is to: Home  Diet: Diet Order             Diet renal with fluid restriction Fluid restriction: 1200 mL Fluid; Room service appropriate? Yes; Fluid consistency: Thin  Diet effective now                     ------------------------------------------------------------------------------------- Severity of Illness: The appropriate patient status for this patient is OBSERVATION. Observation status is judged to be reasonable and necessary in order to provide the required intensity of service to ensure the patient's safety. The patient's presenting symptoms, physical exam findings, and initial radiographic and laboratory data in the context of their medical condition is felt to place them at decreased risk for further clinical deterioration. Furthermore, it is anticipated that the patient will be medically stable for discharge from the hospital within 2 midnights of admission.  -------------------------------------------------------------------------------------  Home Meds: Prior to Admission medications   Medication Sig Start Date End Date Taking? Authorizing Provider  acetaminophen (TYLENOL) 500 MG tablet Take 1,000 mg by mouth every 6 (six) hours as needed for moderate pain.   Yes [provider]  amLODipine (NORVASC) 10 MG tablet TAKE ONE TABLET BY MOUTH ONCE DAILY (AM) 08/29/22  Yes Marcine Matar, MD  carvedilol (COREG) 25 MG tablet TAKE 1 TABLET BY MOUTH 2 (TWO) TIMES DAILY WITH A MEAL (AM+PM) 08/29/22  Yes Marcine Matar, MD  chlorthalidone (HYGROTON) 25 MG tablet Take 12.5 mg by mouth daily. 07/11/21  Yes [provider]  empagliflozin (JARDIANCE) 10 MG TABS tablet 1 tab PO Q Mon/Wed/Frid.  Stop Metformin. Patient taking differently: Take 10 mg by mouth every Monday, Wednesday, and Friday. 10/11/22  Yes Marcine Matar, MD  famotidine (PEPCID) 20 MG tablet TAKE 1 TABLET (20 MG TOTAL) BY MOUTH AT BEDTIME. 10/02/22  Yes Hoy Register, MD  ferrous sulfate 325 (65 FE) MG tablet Take 1 tablet (325 mg total) by mouth daily with breakfast. 06/27/22  Yes Marcine Matar, MD  gabapentin (NEURONTIN) 300 MG capsule TAKE 1 CAPSULE (300 MG TOTAL) BY MOUTH 2 (TWO) TIMES DAILY.  (AM+BEDTIME) Patient taking differently: Take 300 mg by mouth 2 (two) times daily. 08/30/22  Yes Marcine Matar, MD  pravastatin (PRAVACHOL) 20 MG tablet TAKE 1 TABLET (20 MG TOTAL) BY MOUTH DAILY. (BEDTIME) 08/29/22  Yes Marcine Matar, MD  valsartan (DIOVAN) 40 MG tablet TAKE 1 TABLET (40 MG TOTAL) BY MOUTH DAILY. (AM) 11/07/22  Yes Marcine Matar, MD  Continuous Glucose Sensor (DEXCOM G6 SENSOR) MISC USE DAILY AS DIRECTED. ONE SENSOR LAST FOR 10 DAYS. 11/07/22   Marcine Matar, MD  Continuous Glucose Transmitter (DEXCOM G6 TRANSMITTER) MISC USE AS DIRECTED DAILY 11/30/22   Marcine Matar, MD  oxyCODONE-acetaminophen (PERCOCET) 5-325 MG tablet Take 1 tablet by mouth every 6 (six) hours as needed for severe pain. 11/29/22   Dara Lords, PA-C    Labs on Admission:   CBC: Recent Labs  Lab 12/17/22 2045  WBC  15.3*  NEUTROABS 11.9*  HGB 8.7*  HCT 27.3*  MCV 92.2  PLT 246    Basic Metabolic Panel: Recent Labs  Lab 12/17/22 2045  NA 138  K 4.1  CL 106  CO2 19*  GLUCOSE 107*  BUN 65*  CREATININE 4.28*  CALCIUM 8.9    Liver Function Tests: Recent Labs  Lab 12/17/22 2045  AST 8*  ALT 12  ALKPHOS 66  BILITOT 0.6  PROT 6.9  ALBUMIN 3.1*   Recent Labs  Lab 12/17/22 2045  LIPASE 33   No results for input(s): "AMMONIA" in the last 168 hours.  Cardiac Enzymes: No results for input(s): "CKTOTAL", "CKMB", "CKMBINDEX", "TROPONINI" in the last 168 hours.  BNP (last 3 results) Recent Labs    06/25/22 1627  BNP 164.5*    ProBNP (last 3 results) No results for input(s): "PROBNP" in the last 8760 hours.  CBG: Recent Labs  Lab 12/18/22 0732  GLUCAP 119*    Lipase     Component Value Date/Time   LIPASE 33 12/17/2022 2045     Urinalysis    Component Value Date/Time   COLORURINE STRAW (A) 12/18/2022 0612   APPEARANCEUR CLEAR 12/18/2022 0612   LABSPEC 1.010 12/18/2022 0612   PHURINE 5.0 12/18/2022 0612   GLUCOSEU 150 (A) 12/18/2022 0612    HGBUR NEGATIVE 12/18/2022 0612   BILIRUBINUR NEGATIVE 12/18/2022 0612   BILIRUBINUR negative 01/27/2020 1233   BILIRUBINUR negative 10/29/2016 1413   KETONESUR NEGATIVE 12/18/2022 0612   PROTEINUR 100 (A) 12/18/2022 0612   UROBILINOGEN 0.2 01/27/2020 1233   NITRITE NEGATIVE 12/18/2022 0612   LEUKOCYTESUR NEGATIVE 12/18/2022 0612     Drugs of Abuse     Component Value Date/Time   LABOPIA NONE DETECTED 02/22/2016 1757   COCAINSCRNUR NONE DETECTED 02/22/2016 1757   LABBENZ NONE DETECTED 02/22/2016 1757   AMPHETMU NONE DETECTED 02/22/2016 1757   THCU POSITIVE (A) 02/22/2016 1757   LABBARB NONE DETECTED 02/22/2016 1757      Radiological Exams on Admission: CT ABDOMEN PELVIS WO CONTRAST  Result Date: 12/17/2022 CLINICAL DATA:  Right lower quadrant pain recent peritoneal dialysis catheter placement EXAM: CT ABDOMEN AND PELVIS WITHOUT CONTRAST TECHNIQUE: Multidetector CT imaging of the abdomen and pelvis was performed following the standard protocol without IV contrast. RADIATION DOSE REDUCTION: This exam was performed according to the departmental dose-optimization program which includes automated exposure control, adjustment of the mA and/or kV according to patient size and/or use of iterative reconstruction technique. COMPARISON:  CT 04/11/2016, 02/16/2016 FINDINGS: Lower chest: Lung bases demonstrate no acute airspace disease. Borderline cardiomegaly. Hepatobiliary: No focal liver abnormality is seen. No gallstones, gallbladder wall thickening, or biliary dilatation. Pancreas: Unremarkable. No pancreatic ductal dilatation or surrounding inflammatory changes. Spleen: Normal in size without focal abnormality. Adrenals/Urinary Tract: Adrenal glands are within normal limits. Nonspecific bilateral perinephric fat stranding. Mild fat stranding at both renal pelvises. No hydronephrosis. No ureteral stone. The bladder is normal Stomach/Bowel: The stomach is nonenlarged. No dilated small bowel. No  acute bowel wall thickening. Nonvisualized appendix Vascular/Lymphatic: Mild aortic atherosclerosis. No aneurysm. No suspicious lymph nodes Reproductive: Prostate is unremarkable. Other: No free air. Small fat containing right inguinal hernia. Peritoneal dialysis catheter at the left upper quadrant with catheter tunneled in the subcutaneous fat. Soft tissue stranding and edema surrounding the catheter with more pronounced stranding and edema surrounding the anterior to posterior segment of the catheter. Soft tissue stranding about the catheter in the peritoneal cavity, this is coiled in the right lower quadrant.  No focal fluid collections. No organized abscess. Musculoskeletal: No acute osseous abnormality IMPRESSION: 1. Skin thickening and considerable subcutaneous edema within the anterior abdominal wall. Left abdominal peritoneal dialysis catheter with prominent soft tissue stranding and small volume fluid around the catheter within the subcutaneous fat of the abdominal wall as well as surrounding the catheter as it crosses the rectus sheath and is coiled in the right lower quadrant. Findings could be related to recent surgical status but would correlate for signs or symptoms of cellulitis and or infection. There is no evidence for organized abscess within the subcutaneous soft tissues or within the peritoneal cavity. 2. Nonspecific perinephric fat stranding. Some hazy stranding about the renal pelvises also nonspecific but correlate for upper urinary tract infection. Electronically Signed   By: Jasmine Pang M.D.   On: 12/17/2022 23:38     Signed, Lorin Glass, MD [facility] 12/18/2022

## 2022-12-18 NOTE — Telephone Encounter (Signed)
Requested Prescriptions  Pending Prescriptions Disp Refills   JARDIANCE 10 MG TABS tablet [Pharmacy Med Name: JARDIANCE 10 MG ORAL TABLET] 12 tablet 2    Sig: TAKE ONE TABLET BY MOUTH ON MONDAY,WEDNESDAY AND FRIDAY (AM).     Endocrinology:  Diabetes - SGLT2 Inhibitors Failed - 12/17/2022  2:26 PM      Failed - Cr in normal range and within 360 days    Creat  Date Value Ref Range Status  04/10/2016 0.73 0.60 - 1.35 mg/dL Final   Creatinine, Ser  Date Value Ref Range Status  12/17/2022 4.28 (H) 0.61 - 1.24 mg/dL Final         Passed - HBA1C is between 0 and 7.9 and within 180 days    HbA1c, POC (controlled diabetic range)  Date Value Ref Range Status  06/25/2022 6.9 0.0 - 7.0 % Final         Passed - eGFR in normal range and within 360 days    GFR, Est African American  Date Value Ref Range Status  04/10/2016 >89 >=60 mL/min Final   GFR calc Af Amer  Date Value Ref Range Status  01/27/2020 91 >59 mL/min/1.73 Final    Comment:    **In accordance with recommendations from the NKF-ASN Task force,**   [facility] is in the process of updating its eGFR calculation to the   2021 CKD-EPI creatinine equation that estimates kidney function   without a race variable.    GFR, Est Non African American  Date Value Ref Range Status  04/10/2016 >89 >=60 mL/min Final   GFR, Estimated  Date Value Ref Range Status  12/17/2022 17 (L) >60 mL/min Final    Comment:    (NOTE) Calculated using the CKD-EPI Creatinine Equation (2021)    eGFR  Date Value Ref Range Status  04/17/2021 31 (L) >59 mL/min/1.73 Final   EGFR  Date Value Ref Range Status  11/06/2022 18.0  Final    Comment:    ABSTRACTED BY HIM         Passed - Valid encounter within last 6 months    Recent Outpatient Visits           3 months ago Type 2 diabetes mellitus with peripheral neuropathy (HCC)   [place] [facility] Jonah Blue B, MD   5 months ago Hypertension associated with  diabetes [facility])   [place] [facility] [place], [place] L, RPH-CPP   5 months ago Diabetic polyneuropathy associated with type 2 diabetes mellitus [facility])   [place] [facility] Jonah Blue B, MD   7 months ago Hypertension associated with diabetes [facility])   [facility] [place], Shubert L, RPH-CPP   8 months ago Hypertension associated with diabetes [facility])   [place] [facility] [place], Cornelius Moras, RPH-CPP       Future Appointments             In 1 week Marcine Matar, MD [facility]

## 2022-12-18 NOTE — Progress Notes (Signed)
Transition of Care [facility]) - Inpatient Brief Assessment   Patient Details  Name: Tor Tsuda MRN: [id number] Date of Birth: 1986-01-13  Transition of Care [facility]) CM/SW Contact:    Janae Bridgeman, RN Phone Number: 12/18/2022, 11:12 AM   Clinical Narrative: CM noted patient admitted for cellulitis of abdominal wall.  No TOC needs at this time.  Food insecurity resources provided in the AVS.   Transition of Care Asessment: Insurance and Status: (P) Insurance coverage has been reviewed Patient has primary care physician: (P) Yes Home environment has been reviewed: (P) From home Prior level of function:: (P) Independent Prior/Current Home Services: (P) No current home services Social Determinants of Health Reivew: (P) SDOH reviewed no interventions necessary Readmission risk has been reviewed: (P) Yes Transition of care needs: (P) no transition of care needs at this time

## 2022-12-18 NOTE — ED Notes (Signed)
ED TO INPATIENT HANDOFF REPORT  ED Nurse Name and Phone #: Shandra Szymborski [phone]  S Name/Age/Gender [name] [name] 37 y.o. male Room/Bed: 029C/029C  Code Status   Code Status: Full Code  Home/SNF/Other Home Patient oriented to: self, place, time, and situation Is this baseline? Yes   Triage Complete: Triage complete  Chief Complaint Cellulitis of abdominal wall [L03.311]  Triage Note Pt BIB [facility] from home. Pt had peritoneal dialysis shunt placed two weeks ago. Yesterday pt started to have lower abdominal  pain. Pain reported 8/10 pain so EMS gave 100 mcg of fentanyl.   EMS VS P 90 O2 97% CBG 111 18 LAC   Allergies No Known Allergies  Level of Care/Admitting Diagnosis ED Disposition     ED Disposition  Admit   Condition  --   Comment  Hospital Area: [facility] [[id number]]  Level of Care: Med-Surg [16]  May place patient in observation at [facility] if equivalent level of care is available:: Yes  Covid Evaluation: Asymptomatic - no recent exposure (last 10 days) testing not required  Diagnosis: Cellulitis of abdominal wall [161096]  Admitting Physician: Lorin Glass [[id number]]  Attending Physician: Lorin Glass [[id number]]          B Medical/Surgery History Past Medical History:  Diagnosis Date   Anemia    Arthritis    "neck; maybe in my fingers" (11/06/2016)   Asthma    Bipolar disorder (HCC)    "IED: intentional deficit disorder"   Chronic kidney disease (CKD), stage III (moderate) (HCC)    Depression    Heart murmur    "when I was a child"   Hypertension    Type II diabetes mellitus (HCC)    Past Surgical History:  Procedure Laterality Date   AMPUTATION Left 11/05/2016   Procedure: AMPUTATION LEFT GREAT TOE;  Surgeon: Nadara Mustard, MD;  Location: [facility] OR;  Service: Orthopedics;  Laterality: Left;   BIOPSY  06/11/2022   Procedure: BIOPSY;  Surgeon: Shellia Cleverly, DO;  Location: [facility];  Service: Gastroenterology;;    CAPD INSERTION N/A 11/29/2022   Procedure: LAPAROSCOPIC INSERTION CONTINUOUS AMBULATORY PERITONEAL DIALYSIS  (CAPD) CATHETER;  Surgeon: Leonie Douglas, MD;  Location: [facility] OR;  Service: Vascular;  Laterality: N/A;   CATARACT EXTRACTION Left 07/2020   DRESSING CHANGE UNDER ANESTHESIA N/A 04/13/2016   Procedure: DRESSING CHANGE UNDER ANESTHESIA;  Surgeon: Luretha Murphy, MD;  Location: [facility]S;  Service: General;  Laterality: N/A;   ESOPHAGOGASTRODUODENOSCOPY (EGD) WITH PROPOFOL N/A 06/11/2022   Procedure: ESOPHAGOGASTRODUODENOSCOPY (EGD) WITH PROPOFOL;  Surgeon: Shellia Cleverly, DO;  [facility]: [facility];  Service: Gastroenterology;  Laterality: N/A;   INCISION AND DRAINAGE ABSCESS Right 09/03/2015   Procedure: INCISION AND DRAINAGE ABSCESS;  Surgeon: Dominica Severin, MD;  Location: [facility];  Service: Orthopedics;  Laterality: Right;   INCISION AND DRAINAGE PERIRECTAL ABSCESS N/A 04/11/2016   Procedure: IRRIGATION AND DEBRIDEMENT RECTAL ABSCESS;  Surgeon: Luretha Murphy, MD;  Location: [facility];  Service: General;  Laterality: N/A;   INGUINAL HERNIA REPAIR  1988; 1989   x2    IRRIGATION AND DEBRIDEMENT ABSCESS Left 08/31/2015   Procedure: IRRIGATION AND DEBRIDEMENT ABSCESS LEFT SHOULDER;  Surgeon: Ovidio Kin, MD;  Location: [facility];  Service: General;  Laterality: Left;   RETINAL LASER PROCEDURE Bilateral    TUMOR EXCISION     neck   WOUND DEBRIDEMENT N/A 02/24/2016   Procedure: SACRAL WOUND DEBRIDEMENT;  Surgeon: Violeta Gelinas, MD;  Location: [facility];  Service: General;  Laterality: N/A;     A IV Location/Drains/Wounds Patient Lines/Drains/Airways Status     Active Line/Drains/Airways     Name Placement date Placement time Site Days   Peripheral IV 12/17/22 18 G Anterior;Distal;Left;Upper Arm 12/17/22  2051  Arm  1   Incision - 4 Ports Abdomen 1: Upper;Right;Anterior 2: Left;Upper;Anterior 3: Right;Mid;Anterior 4: Anterior;Umbilicus 11/29/22  0840  -- 19   Wound / Incision (Open or  Dehisced) 02/23/16 Non-pressure wound Sacrum Medial s/p I&D 02/23/16  0520  Sacrum  2490   Wound / Incision (Open or Dehisced) 12/05/19 Other (Comment) Leg Left;Lower abcess/cellutiltis to left lower leg-present upon admission 12/05/19  2000  Leg  1109   Wound / Incision (Open or Dehisced) 03/17/20 Diabetic ulcer Foot Right;Lateral 03/17/20  1600  Foot  1006            Intake/Output Last 24 hours No intake or output data in the 24 hours ending 12/18/22 0819  Labs/Imaging Results for orders placed or performed during the hospital encounter of 12/17/22 (from the past 48 hour(s))  Comprehensive metabolic panel     Status: Abnormal   Collection Time: 12/17/22  8:45 PM  Result Value Ref Range   Sodium 138 135 - 145 mmol/L   Potassium 4.1 3.5 - 5.1 mmol/L   Chloride 106 98 - 111 mmol/L   CO2 19 (L) 22 - 32 mmol/L   Glucose, Bld 107 (H) 70 - 99 mg/dL    Comment: Glucose reference range applies only to samples taken after fasting for at least 8 hours.   BUN 65 (H) 6 - 20 mg/dL   Creatinine, Ser 4.74 (H) 0.61 - 1.24 mg/dL   Calcium 8.9 8.9 - 25.9 mg/dL   Total Protein 6.9 6.5 - 8.1 g/dL   Albumin 3.1 (L) 3.5 - 5.0 g/dL   AST 8 (L) 15 - 41 U/L   ALT 12 0 - 44 U/L   Alkaline Phosphatase 66 38 - 126 U/L   Total Bilirubin 0.6 0.3 - 1.2 mg/dL   GFR, Estimated 17 (L) >60 mL/min    Comment: (NOTE) Calculated using the CKD-EPI Creatinine Equation (2021)    Anion gap 13 5 - 15    Comment: Performed at [facility], [address] [place], [place] [address]  CBC with Differential     Status: Abnormal   Collection Time: 12/17/22  8:45 PM  Result Value Ref Range   WBC 15.3 (H) 4.0 - 10.5 K/uL   RBC 2.96 (L) 4.22 - 5.81 MIL/uL   Hemoglobin 8.7 (L) 13.0 - 17.0 g/dL   HCT 56.4 (L) 33.2 - 95.1 %   MCV 92.2 80.0 - 100.0 fL   MCH 29.4 26.0 - 34.0 pg   MCHC 31.9 30.0 - 36.0 g/dL   RDW 88.4 16.6 - 06.3 %   Platelets 246 150 - 400 K/uL   nRBC 0.0 0.0 - 0.2 %   Neutrophils Relative % 77 %    Neutro Abs 11.9 (H) 1.7 - 7.7 K/uL   Lymphocytes Relative 9 %   Lymphs Abs 1.4 0.7 - 4.0 K/uL   Monocytes Relative 7 %   Monocytes Absolute 1.1 (H) 0.1 - 1.0 K/uL   Eosinophils Relative 5 %   Eosinophils Absolute 0.8 (H) 0.0 - 0.5 K/uL   Basophils Relative 1 %   Basophils Absolute 0.1 0.0 - 0.1 K/uL   Immature Granulocytes 1 %   Abs Immature Granulocytes 0.09 (H) 0.00 - 0.07 K/uL  Comment: Performed at [facility], [address] [place], [place] [address]  Lipase, blood     Status: None   Collection Time: 12/17/22  8:45 PM  Result Value Ref Range   Lipase 33 11 - 51 U/L    Comment: Performed at [facility], [address], [place], [place] [address]  Urinalysis, Routine w reflex microscopic -Urine, Clean Catch     Status: Abnormal   Collection Time: 12/18/22  6:12 AM  Result Value Ref Range   Color, Urine STRAW (A) YELLOW   APPearance CLEAR CLEAR   Specific Gravity, Urine 1.010 1.005 - 1.030   pH 5.0 5.0 - 8.0   Glucose, UA 150 (A) NEGATIVE mg/dL   Hgb urine dipstick NEGATIVE NEGATIVE   Bilirubin Urine NEGATIVE NEGATIVE   Ketones, ur NEGATIVE NEGATIVE mg/dL   Protein, ur 536 (A) NEGATIVE mg/dL   Nitrite NEGATIVE NEGATIVE   Leukocytes,Ua NEGATIVE NEGATIVE   RBC / HPF 0-5 0 - 5 RBC/hpf   WBC, UA 0-5 0 - 5 WBC/hpf   Bacteria, UA RARE (A) NONE SEEN   Squamous Epithelial / HPF 0-5 0 - 5 /HPF   Mucus PRESENT     Comment: Performed at [facility], [address], [place], [place] [address]  CBG monitoring, ED     Status: Abnormal   Collection Time: 12/18/22  7:32 AM  Result Value Ref Range   Glucose-Capillary 119 (H) 70 - 99 mg/dL    Comment: Glucose reference range applies only to samples taken after fasting for at least 8 hours.   CT ABDOMEN PELVIS WO CONTRAST  Result Date: 12/17/2022 CLINICAL DATA:  Right lower quadrant pain recent peritoneal dialysis catheter placement EXAM: CT ABDOMEN AND PELVIS WITHOUT CONTRAST TECHNIQUE: Multidetector CT  imaging of the abdomen and pelvis was performed following the standard protocol without IV contrast. RADIATION DOSE REDUCTION: This exam was performed according to the departmental dose-optimization program which includes automated exposure control, adjustment of the mA and/or kV according to patient size and/or use of iterative reconstruction technique. COMPARISON:  CT 04/11/2016, 02/16/2016 FINDINGS: Lower chest: Lung bases demonstrate no acute airspace disease. Borderline cardiomegaly. Hepatobiliary: No focal liver abnormality is seen. No gallstones, gallbladder wall thickening, or biliary dilatation. Pancreas: Unremarkable. No pancreatic ductal dilatation or surrounding inflammatory changes. Spleen: Normal in size without focal abnormality. Adrenals/Urinary Tract: Adrenal glands are within normal limits. Nonspecific bilateral perinephric fat stranding. Mild fat stranding at both renal pelvises. No hydronephrosis. No ureteral stone. The bladder is normal Stomach/Bowel: The stomach is nonenlarged. No dilated small bowel. No acute bowel wall thickening. Nonvisualized appendix Vascular/Lymphatic: Mild aortic atherosclerosis. No aneurysm. No suspicious lymph nodes Reproductive: Prostate is unremarkable. Other: No free air. Small fat containing right inguinal hernia. Peritoneal dialysis catheter at the left upper quadrant with catheter tunneled in the subcutaneous fat. Soft tissue stranding and edema surrounding the catheter with more pronounced stranding and edema surrounding the anterior to posterior segment of the catheter. Soft tissue stranding about the catheter in the peritoneal cavity, this is coiled in the right lower quadrant. No focal fluid collections. No organized abscess. Musculoskeletal: No acute osseous abnormality IMPRESSION: 1. Skin thickening and considerable subcutaneous edema within the anterior abdominal wall. Left abdominal peritoneal dialysis catheter with prominent soft tissue stranding and  small volume fluid around the catheter within the subcutaneous fat of the abdominal wall as well as surrounding the catheter as it crosses the rectus sheath and is coiled in the right  lower quadrant. Findings could be related to recent surgical status but would correlate for signs or symptoms of cellulitis and or infection. There is no evidence for organized abscess within the subcutaneous soft tissues or within the peritoneal cavity. 2. Nonspecific perinephric fat stranding. Some hazy stranding about the renal pelvises also nonspecific but correlate for upper urinary tract infection. Electronically Signed   By: Jasmine Pang M.D.   On: 12/17/2022 23:38    Pending Labs Unresulted Labs (From admission, onward)     Start     Ordered   12/19/22 0500  Basic metabolic panel  Tomorrow morning,   R        12/18/22 0816   12/19/22 0500  CBC  Tomorrow morning,   R        12/18/22 0816   12/18/22 0816  Hemoglobin A1c  Add-on,   AD       Comments: To assess prior glycemic control    12/18/22 0816   12/18/22 0815  HIV Antibody (routine testing w rflx)  (HIV Antibody (Routine testing w reflex) panel)  Once,   R        12/18/22 0816   12/18/22 0710  Body fluid cell count with differential  Once,   URGENT       Question:  Are there also cytology or pathology orders on this specimen?  Answer:  No   12/18/22 0709   12/18/22 0535  Blood culture (routine x 2)  BLOOD CULTURE X 2,   R (with STAT occurrences)      12/18/22 0534   12/18/22 0534  Lactate dehydrogenase (pleural or peritoneal fluid)  (Peritoneal fluid analysis panel (pnl))  ONCE - URGENT,   URGENT        12/18/22 0533   12/18/22 0534  Glucose, pleural or peritoneal fluid  (Peritoneal fluid analysis panel (pnl))  ONCE - URGENT,   URGENT        12/18/22 0533   12/18/22 0534  Protein, pleural or peritoneal fluid  (Peritoneal fluid analysis panel (pnl))  ONCE - URGENT,   URGENT        12/18/22 0533   12/18/22 0534  Albumin, pleural or peritoneal fluid    (Peritoneal fluid analysis panel (pnl))  ONCE - URGENT,   URGENT        12/18/22 0533   12/18/22 0534  Body fluid culture w Gram Stain  (Peritoneal fluid analysis panel (pnl))  ONCE - URGENT,   URGENT       Question:  Are there also cytology or pathology orders on this specimen?  Answer:  No   12/18/22 0533            Vitals/Pain Today's Vitals   12/18/22 0435 12/18/22 0500 12/18/22 0600 12/18/22 0716  BP:  (!) 154/102 134/73   Pulse:  77 (!) 56   Resp:  16    Temp:    98.5 F (36.9 C)  SpO2:  98% 97%   Weight: 112 kg     Height: 5\' 8"  (1.727 m)     PainSc:        Isolation Precautions No active isolations  Medications Medications  HYDROmorphone (DILAUDID) injection 0.5 mg (has no administration in time range)  0.9 %  sodium chloride infusion (has no administration in time range)  insulin aspart (novoLOG) injection 0-9 Units (has no administration in time range)  insulin aspart (novoLOG) injection 0-5 Units (has no administration in time range)  acetaminophen (TYLENOL) tablet 650 mg (  has no administration in time range)    Or  acetaminophen (TYLENOL) suppository 650 mg (has no administration in time range)  albuterol (PROVENTIL) (2.5 MG/3ML) 0.083% nebulizer solution 2.5 mg (has no administration in time range)  hydrALAZINE (APRESOLINE) injection 10 mg (has no administration in time range)  heparin injection 5,000 Units (has no administration in time range)  docusate sodium (COLACE) capsule 100 mg (has no administration in time range)  senna (SENOKOT) tablet 8.6 mg (has no administration in time range)  HYDROcodone-acetaminophen (NORCO/VICODIN) 5-325 MG per tablet 1 tablet (1 tablet Oral Given 12/17/22 2055)  ondansetron (ZOFRAN-ODT) disintegrating tablet 8 mg (8 mg Oral Given 12/17/22 2055)    Mobility walks     Focused Assessments Renal Assessment Handoff:  Hemodialysis Schedule: first run was scheduled for today Last Hemodialysis date and time: never    Restricted appendage:   Dialysis catheter in place   R Recommendations: See Admitting Provider Note  Report given to:   Additional Notes:

## 2022-12-18 NOTE — ED Provider Notes (Addendum)
[place] EMERGENCY DEPARTMENT AT [facility] Provider Note  CSN: [id number] Arrival date & time: 12/17/22 2041  Chief Complaint(s) Abdominal Pain  HPI [name] [name] is a 37 y.o. 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  Catheter was placed September 12.  Patient presents for lower abdominal pain that began 1 to 2 days ago and gradually worsening.  Patient reports that since getting the catheter, he has been getting intermittent flushing which endorses right lower quadrant pain.  This became worse the last time the catheter was flushed couple days ago.  In addition, he reports developing left lower abdominal wall tenderness to palpation and increased firmness.  He denies any fevers or chills.  No nausea or vomiting.  No diarrhea.  No other physical complaints.  The history is provided by the patient.    Past Medical History Past Medical History:  Diagnosis Date   Anemia    Arthritis    "neck; maybe in my fingers" (11/06/2016)   Asthma    Bipolar disorder (HCC)    "IED: intentional deficit disorder"   Chronic kidney disease (CKD), stage III (moderate) (HCC)    Depression    Heart murmur    "when I was a child"   Hypertension    Type II diabetes mellitus (HCC)    Patient Active Problem List   Diagnosis Date Noted   Cellulitis of abdominal wall 12/18/2022   History of amputation of toe (HCC) 06/25/2022   CKD stage 4 due to type 2 diabetes mellitus (HCC) 06/25/2022   Moderately severe recurrent major depression (HCC) 06/21/2022   Cannabis use disorder 06/21/2022   Nausea and vomiting 06/11/2022   Bloating 06/11/2022   Gastritis and gastroduodenitis 06/11/2022   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   Chest pain of uncertain etiology 11/08/2021   Gastroparesis 04/17/2021   Abnormality of gait 10/11/2020   Macroalbuminuric diabetic nephropathy (HCC) 08/10/2020    Hemiparesis of right dominant side due to non-cerebrovascular etiology (HCC) 08/10/2020   Cellulitis, leg 03/17/2020   Cellulitis of right leg 03/17/2020   Uncontrolled type 2 diabetes mellitus with complication, with long-term current use of insulin    Abscess    Cellulitis 12/05/2019   Cellulitis of left lower extremity    Abscess of left lower extremity    Uncontrolled type 2 diabetes mellitus with hyperglycemia (HCC)    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 2 diabetes mellitus (HCC)    Cellulitis of foot, left 11/22/2016   Diabetic neuropathy (HCC) 11/22/2016   Fracture of 2nd metatarsal 11/22/2016   Fracture of 3rd metatarsal 11/22/2016   Postprocedural non-healing wound 11/22/2016   Acute cystitis with positive culture    Cutaneous abscess of left foot    Poorly controlled type 2 diabetes mellitus with complication (HCC) 10/27/2016   Hyperlipidemia 10/17/2016   Depression 10/17/2016   Neck pain    Asthma 04/11/2016   Uncontrolled type 2 diabetes mellitus with complication    Obesity, morbid (HCC) 09/06/2015   Hypertension associated with diabetes (HCC) 09/06/2015   Home Medication(s) Prior to Admission medications   Medication Sig Start Date End Date Taking? Authorizing Provider  acetaminophen (TYLENOL) 500 MG tablet Take 1,000 mg by mouth every 6 (six) hours as needed for moderate pain.   Yes [provider]  amLODipine (NORVASC) 10 MG tablet TAKE ONE TABLET BY MOUTH ONCE DAILY (AM) 08/29/22  Yes Marcine Matar, MD  carvedilol (COREG) 25 MG  tablet TAKE 1 TABLET BY MOUTH 2 (TWO) TIMES DAILY WITH A MEAL (AM+PM) 08/29/22  Yes Marcine Matar, MD  chlorthalidone (HYGROTON) 25 MG tablet Take 12.5 mg by mouth daily. 07/11/21  Yes [provider]  empagliflozin (JARDIANCE) 10 MG TABS tablet 1 tab PO Q Mon/Wed/Frid.  Stop Metformin. Patient taking differently: Take 10 mg by mouth every Monday, Wednesday, and Friday. 10/11/22  Yes Marcine Matar, MD  famotidine (PEPCID) 20  MG tablet TAKE 1 TABLET (20 MG TOTAL) BY MOUTH AT BEDTIME. 10/02/22  Yes Hoy Register, MD  ferrous sulfate 325 (65 FE) MG tablet Take 1 tablet (325 mg total) by mouth daily with breakfast. 06/27/22  Yes Marcine Matar, MD  gabapentin (NEURONTIN) 300 MG capsule TAKE 1 CAPSULE (300 MG TOTAL) BY MOUTH 2 (TWO) TIMES DAILY. (AM+BEDTIME) Patient taking differently: Take 300 mg by mouth 2 (two) times daily. 08/30/22  Yes Marcine Matar, MD  pravastatin (PRAVACHOL) 20 MG tablet TAKE 1 TABLET (20 MG TOTAL) BY MOUTH DAILY. (BEDTIME) 08/29/22  Yes Marcine Matar, MD  valsartan (DIOVAN) 40 MG tablet TAKE 1 TABLET (40 MG TOTAL) BY MOUTH DAILY. (AM) 11/07/22  Yes Marcine Matar, MD  Continuous Glucose Sensor (DEXCOM G6 SENSOR) MISC USE DAILY AS DIRECTED. ONE SENSOR LAST FOR 10 DAYS. 11/07/22   Marcine Matar, MD  Continuous Glucose Transmitter (DEXCOM G6 TRANSMITTER) MISC USE AS DIRECTED DAILY 11/30/22   Marcine Matar, MD  oxyCODONE-acetaminophen (PERCOCET) 5-325 MG tablet Take 1 tablet by mouth every 6 (six) hours as needed for severe pain. 11/29/22   Dara Lords, PA-C                                                                                                                                    Allergies Patient has no known allergies.  Review of Systems Review of Systems As noted in HPI  Physical Exam Vital Signs  I have reviewed the triage vital signs BP 134/73   Pulse (!) 56   Temp 98.5 F (36.9 C)   Resp 16   Ht 5\' 8"  (1.727 m)   Wt 112 kg   SpO2 97%   BMI 37.54 kg/m   Physical Exam Vitals reviewed.  Constitutional:      General: He is not in acute distress.    Appearance: He is well-developed. He is not diaphoretic.  HENT:     Head: Normocephalic and atraumatic.     Right Ear: External ear normal.     Left Ear: External ear normal.     Nose: Nose normal.     Mouth/Throat:     Mouth: Mucous membranes are moist.  Eyes:     General: No scleral icterus.     Conjunctiva/sclera: Conjunctivae normal.  Neck:     Trachea: Phonation normal.  Cardiovascular:     Rate and Rhythm: Normal rate and regular rhythm.  Pulmonary:  Effort: Pulmonary effort is normal. No respiratory distress.     Breath sounds: No stridor.  Abdominal:     General: There is no distension.     Tenderness: There is abdominal tenderness in the right lower quadrant and suprapubic area.       Comments: Multiple trochar incision site - C/D/I. No erythema.  Musculoskeletal:        General: Normal range of motion.     Cervical back: Normal range of motion.  Neurological:     Mental Status: He is alert and oriented to person, place, and time.  Psychiatric:        Behavior: Behavior normal.     ED Results and Treatments Labs (all labs ordered are listed, but only abnormal results are displayed) Labs Reviewed  COMPREHENSIVE METABOLIC PANEL - Abnormal; Notable for the following components:      Result Value   CO2 19 (*)    Glucose, Bld 107 (*)    BUN 65 (*)    Creatinine, Ser 4.28 (*)    Albumin 3.1 (*)    AST 8 (*)    GFR, Estimated 17 (*)    All other components within normal limits  CBC WITH DIFFERENTIAL/PLATELET - Abnormal; Notable for the following components:   WBC 15.3 (*)    RBC 2.96 (*)    Hemoglobin 8.7 (*)    HCT 27.3 (*)    Neutro Abs 11.9 (*)    Monocytes Absolute 1.1 (*)    Eosinophils Absolute 0.8 (*)    Abs Immature Granulocytes 0.09 (*)    All other components within normal limits  URINALYSIS, ROUTINE W REFLEX MICROSCOPIC - Abnormal; Notable for the following components:   Color, Urine STRAW (*)    Glucose, UA 150 (*)    Protein, ur 100 (*)    Bacteria, UA RARE (*)    All other components within normal limits  CBG MONITORING, ED - Abnormal; Notable for the following components:   Glucose-Capillary 119 (*)    All other components within normal limits  BODY FLUID CULTURE W GRAM STAIN  CULTURE, BLOOD (ROUTINE X 2)  CULTURE, BLOOD (ROUTINE  X 2)  LIPASE, BLOOD  LACTATE DEHYDROGENASE, PLEURAL OR PERITONEAL FLUID  GLUCOSE, PLEURAL OR PERITONEAL FLUID  PROTEIN, PLEURAL OR PERITONEAL FLUID  ALBUMIN, PLEURAL OR PERITONEAL FLUID   BODY FLUID CELL COUNT WITH DIFFERENTIAL  HEMOGLOBIN A1C  HIV ANTIBODY (ROUTINE TESTING W REFLEX)                                                                                                                         EKG  EKG Interpretation Date/Time:    Ventricular Rate:    PR Interval:    QRS Duration:    QT Interval:    QTC Calculation:   R Axis:      Text Interpretation:         Radiology CT ABDOMEN PELVIS WO CONTRAST  Result Date: 12/17/2022 CLINICAL DATA:  Right lower quadrant pain recent peritoneal dialysis catheter placement EXAM: CT ABDOMEN AND PELVIS WITHOUT CONTRAST TECHNIQUE: Multidetector CT imaging of the abdomen and pelvis was performed following the standard protocol without IV contrast. RADIATION DOSE REDUCTION: This exam was performed according to the departmental dose-optimization program which includes automated exposure control, adjustment of the mA and/or kV according to patient size and/or use of iterative reconstruction technique. COMPARISON:  CT 04/11/2016, 02/16/2016 FINDINGS: Lower chest: Lung bases demonstrate no acute airspace disease. Borderline cardiomegaly. Hepatobiliary: No focal liver abnormality is seen. No gallstones, gallbladder wall thickening, or biliary dilatation. Pancreas: Unremarkable. No pancreatic ductal dilatation or surrounding inflammatory changes. Spleen: Normal in size without focal abnormality. Adrenals/Urinary Tract: Adrenal glands are within normal limits. Nonspecific bilateral perinephric fat stranding. Mild fat stranding at both renal pelvises. No hydronephrosis. No ureteral stone. The bladder is normal Stomach/Bowel: The stomach is nonenlarged. No dilated small bowel. No acute bowel wall thickening. Nonvisualized appendix Vascular/Lymphatic: Mild  aortic atherosclerosis. No aneurysm. No suspicious lymph nodes Reproductive: Prostate is unremarkable. Other: No free air. Small fat containing right inguinal hernia. Peritoneal dialysis catheter at the left upper quadrant with catheter tunneled in the subcutaneous fat. Soft tissue stranding and edema surrounding the catheter with more pronounced stranding and edema surrounding the anterior to posterior segment of the catheter. Soft tissue stranding about the catheter in the peritoneal cavity, this is coiled in the right lower quadrant. No focal fluid collections. No organized abscess. Musculoskeletal: No acute osseous abnormality IMPRESSION: 1. Skin thickening and considerable subcutaneous edema within the anterior abdominal wall. Left abdominal peritoneal dialysis catheter with prominent soft tissue stranding and small volume fluid around the catheter within the subcutaneous fat of the abdominal wall as well as surrounding the catheter as it crosses the rectus sheath and is coiled in the right lower quadrant. Findings could be related to recent surgical status but would correlate for signs or symptoms of cellulitis and or infection. There is no evidence for organized abscess within the subcutaneous soft tissues or within the peritoneal cavity. 2. Nonspecific perinephric fat stranding. Some hazy stranding about the renal pelvises also nonspecific but correlate for upper urinary tract infection. Electronically Signed   By: Jasmine Pang M.D.   On: 12/17/2022 23:38    Medications Ordered in ED Medications  HYDROmorphone (DILAUDID) injection 0.5 mg (has no administration in time range)  0.9 %  sodium chloride infusion (has no administration in time range)  insulin aspart (novoLOG) injection 0-9 Units (has no administration in time range)  insulin aspart (novoLOG) injection 0-5 Units (has no administration in time range)  acetaminophen (TYLENOL) tablet 650 mg (has no administration in time range)    Or   acetaminophen (TYLENOL) suppository 650 mg (has no administration in time range)  hydrALAZINE (APRESOLINE) injection 10 mg (has no administration in time range)  heparin injection 5,000 Units (has no administration in time range)  docusate sodium (COLACE) capsule 100 mg (has no administration in time range)  senna (SENOKOT) tablet 8.6 mg (has no administration in time range)  albuterol (PROVENTIL) (2.5 MG/3ML) 0.083% nebulizer solution 2.5 mg (has no administration in time range)  HYDROcodone-acetaminophen (NORCO/VICODIN) 5-325 MG per tablet 1 tablet (1 tablet Oral Given 12/17/22 2055)  ondansetron (ZOFRAN-ODT) disintegrating tablet 8 mg (8 mg Oral Given 12/17/22 2055)   Procedures Procedures  (including critical care time) Medical Decision Making / ED Course   Medical Decision Making Amount and/or Complexity of Data Reviewed Labs: ordered. Decision-making details documented in ED Course. Radiology:  ordered and independent interpretation performed. Decision-making details documented in ED Course.  Risk Prescription drug management. Decision regarding hospitalization.    Will need to assess for any complications related to the PD catheter placement.  Also need to consider peritonitis related to PD catheter.  CBC with leukocytosis.  Anemia with relatively stable hemoglobin from 2 weeks ago. Metabolic panel without significant electrolyte derangement.  Stable renal function. UA without evidence of infection.  CT scan negative for any obvious intra-abdominal inflammatory/infectious process.  Abdominal wall appears to be consistent with postoperative changes or cellulitis.  Given clinical picture, cellulitis is more likely.  No evidence of organized fluid collection concerning for abscess.  Currently awaiting dialysis nurse to obtain sample from PD catheter.  Clinical Course as of 12/18/22 0840  Tue Dec 18, 2022  1324 [facility] nephrology who will arrange PD fluid analysis.  [PC]     Clinical Course User Index [PC] Kaelem Brach, Amadeo Garnet, MD   Once sample is obtained, patient can be started on vancomycin and cefepime that can treat both peritonitis and abdominal wall cellulitis.  He was provided with low volume IV fluid infusion and IV pain medicine.  Admitted to medicine for further workup and management.  Final Clinical Impression(s) / ED Diagnoses Final diagnoses:  Abdominal wall cellulitis    This chart was dictated using voice recognition software.  Despite best efforts to proofread,  errors can occur which can change the documentation meaning.      [name], MD 12/18/22 [phone]

## 2022-12-18 NOTE — Progress Notes (Signed)
Pharmacy Antibiotic Note  [name] [name] is a 37 y.o. male admitted on 12/17/2022 with abdominal pain. Recently underwent PD catheter placement on 9/12. Has not started PD yet per nephrology. CTAP showed cellulitis and small volume fluid at PD cath site. Pharmacy has been consulted for ceftazidime dosing. RN attempting to obtain peritoneal fluid for culture.  WBC 15, afebrile.  Plan: Ceftazidime 1g IV q24 hours to start this evening ~1800 per nephro recs  Monitor RRT plans (no plan to start PD here currently) F/u peritoneal culture  Height: 5\' 8"  (172.7 cm) Weight: 112 kg (246 lb 14.6 oz) IBW/kg (Calculated) : 68.4  Temp (24hrs), Avg:98.8 F (37.1 C), Min:98.5 F (36.9 C), Max:99.2 F (37.3 C)  Recent Labs  Lab 12/17/22 2045  WBC 15.3*  CREATININE 4.28*    Estimated Creatinine Clearance: 29 mL/min (A) (by C-G formula based on SCr of 4.28 mg/dL (H)).    No Known Allergies  Antimicrobials this admission: Ceftazidime 10/1 >>   Microbiology results: 10/1 BCx: sent 10/1 Peritoneal fluid cx: ordered  Thank you for allowing pharmacy to be a part of this patient's care.  Rexford Maus, PharmD, BCPS 12/18/2022 1:25 PM

## 2022-12-18 NOTE — Consult Note (Signed)
Renal Service Consult Note [facility]  [name] 12/18/2022 [name] Krabbe, MD Requesting Physician: Dr. Pola Corn  Reason for Consult: ESRD pt w/ new PD cath and abd wall cellulitis HPI: The patient is a 37 y.o. year-old w/ PMH as below who presented to ED last night from home due to lower abd pain. In ED pt afeb, on RA, wBC 15K. CT showed cellulitis, no abscess. Exam showed patch of erythema in mid abdomen. Pt was admitted. We are asked to see for possible peritonitis infection.   Pt seen in room. States he had PD cath placed 2 wks ago. Doing well til the last 1-2 days ago has been having abd pains. Has a red spot in mid upper abdomen. Gets some pain in abdomen when he drains the PD cath flushes.  He has not started training of PD yet, was supposed to start tomorrow. Lives alone, has a dog.   ROS - denies CP, no joint pain, no HA, no blurry vision, no rash, no diarrhea, no nausea/ vomiting   Past Medical History  Past Medical History:  Diagnosis Date   Anemia    Arthritis    "neck; maybe in my fingers" (11/06/2016)   Asthma    Bipolar disorder (HCC)    "IED: intentional deficit disorder"   Chronic kidney disease (CKD), stage III (moderate) (HCC)    Depression    Heart murmur    "when I was a child"   Hypertension    Type II diabetes mellitus (HCC)    Past Surgical History  Past Surgical History:  Procedure Laterality Date   AMPUTATION Left 11/05/2016   Procedure: AMPUTATION LEFT GREAT TOE;  Surgeon: Nadara Mustard, MD;  Location: [facility] OR;  Service: Orthopedics;  Laterality: Left;   BIOPSY  06/11/2022   Procedure: BIOPSY;  Surgeon: Shellia Cleverly, DO;  Location: [facility];  Service: Gastroenterology;;   CAPD INSERTION N/A 11/29/2022   Procedure: LAPAROSCOPIC INSERTION CONTINUOUS AMBULATORY PERITONEAL DIALYSIS  (CAPD) CATHETER;  Surgeon: Leonie Douglas, MD;  Location: [facility] OR;  Service: Vascular;  Laterality: N/A;   CATARACT EXTRACTION Left 07/2020    DRESSING CHANGE UNDER ANESTHESIA N/A 04/13/2016   Procedure: DRESSING CHANGE UNDER ANESTHESIA;  Surgeon: Luretha Murphy, MD;  Location: [facility] ORS;  Service: General;  Laterality: N/A;   ESOPHAGOGASTRODUODENOSCOPY (EGD) WITH PROPOFOL N/A 06/11/2022   Procedure: ESOPHAGOGASTRODUODENOSCOPY (EGD) WITH PROPOFOL;  Surgeon: Shellia Cleverly, DO;  Location: [facility];  Service: Gastroenterology;  Laterality: N/A;   INCISION AND DRAINAGE ABSCESS Right 09/03/2015   Procedure: INCISION AND DRAINAGE ABSCESS;  Surgeon: Dominica Severin, MD;  Location: [facility];  Service: Orthopedics;  Laterality: Right;   INCISION AND DRAINAGE PERIRECTAL ABSCESS N/A 04/11/2016   Procedure: IRRIGATION AND DEBRIDEMENT RECTAL ABSCESS;  Surgeon: Luretha Murphy, MD;  Location: [facility];  Service: General;  Laterality: N/A;   INGUINAL HERNIA REPAIR  1988; 1989   x2    IRRIGATION AND DEBRIDEMENT ABSCESS Left 08/31/2015   Procedure: IRRIGATION AND DEBRIDEMENT ABSCESS LEFT SHOULDER;  Surgeon: Ovidio Kin, MD;  Location: [facility];  Service: General;  Laterality: Left;   RETINAL LASER PROCEDURE Bilateral    TUMOR EXCISION     neck   WOUND DEBRIDEMENT N/A 02/24/2016   Procedure: SACRAL WOUND DEBRIDEMENT;  Surgeon: Violeta Gelinas, MD;  Location: [facility] OR;  Service: General;  Laterality: N/A;   Family History  Family History  Problem Relation Age of Onset   Diabetes type II Mother    Coronary artery  disease Mother    Diabetes type II Father    Colon cancer Maternal Grandfather    Lung cancer Maternal Grandfather    Prostate cancer Maternal Grandfather    Diabetes Maternal Grandfather    Hypertension Maternal Grandfather    Heart disease Paternal Grandfather    Parkinson's disease Paternal Aunt    Multiple sclerosis Paternal Aunt    Social History  reports that he has never smoked. He has never used smokeless tobacco. He reports current alcohol use. He reports current drug use. Drug: Marijuana. Allergies No Known Allergies Home  medications Prior to Admission medications   Medication Sig Start Date End Date Taking? Authorizing Provider  acetaminophen (TYLENOL) 500 MG tablet Take 1,000 mg by mouth every 6 (six) hours as needed for moderate pain.   Yes [provider]  amLODipine (NORVASC) 10 MG tablet TAKE ONE TABLET BY MOUTH ONCE DAILY (AM) 08/29/22  Yes Marcine Matar, MD  carvedilol (COREG) 25 MG tablet TAKE 1 TABLET BY MOUTH 2 (TWO) TIMES DAILY WITH A MEAL (AM+PM) 08/29/22  Yes Marcine Matar, MD  chlorthalidone (HYGROTON) 25 MG tablet Take 12.5 mg by mouth daily. 07/11/21  Yes [provider]  famotidine (PEPCID) 20 MG tablet TAKE 1 TABLET (20 MG TOTAL) BY MOUTH AT BEDTIME. 10/02/22  Yes Newlin, Enobong, MD  ferrous sulfate 325 (65 FE) MG tablet Take 1 tablet (325 mg total) by mouth daily with breakfast. 06/27/22  Yes Marcine Matar, MD  gabapentin (NEURONTIN) 300 MG capsule TAKE 1 CAPSULE (300 MG TOTAL) BY MOUTH 2 (TWO) TIMES DAILY. (AM+BEDTIME) Patient taking differently: Take 300 mg by mouth 2 (two) times daily. 08/30/22  Yes Marcine Matar, MD  pravastatin (PRAVACHOL) 20 MG tablet TAKE 1 TABLET (20 MG TOTAL) BY MOUTH DAILY. (BEDTIME) 08/29/22  Yes Marcine Matar, MD  valsartan (DIOVAN) 40 MG tablet TAKE 1 TABLET (40 MG TOTAL) BY MOUTH DAILY. (AM) 11/07/22  Yes Marcine Matar, MD  Continuous Glucose Sensor (DEXCOM G6 SENSOR) MISC USE DAILY AS DIRECTED. ONE SENSOR LAST FOR 10 DAYS. 11/07/22   Marcine Matar, MD  Continuous Glucose Transmitter (DEXCOM G6 TRANSMITTER) MISC USE AS DIRECTED DAILY 11/30/22   Marcine Matar, MD  JARDIANCE 10 MG TABS tablet TAKE ONE TABLET BY MOUTH ON MONDAY,WEDNESDAY AND FRIDAY (AM). 12/18/22   Marcine Matar, MD  oxyCODONE-acetaminophen (PERCOCET) 5-325 MG tablet Take 1 tablet by mouth every 6 (six) hours as needed for severe pain. 11/29/22   Dara Lords, PA-C     Vitals:   12/18/22 0530 12/18/22 0600 12/18/22 0716 12/18/22 0900  BP:  119/78 134/73  (!) 136/114  Pulse: 74 (!) 56  81  Resp:    20  Temp:   98.5 F (36.9 C)   SpO2: 97% 97%  100%  Weight:      Height:       Exam Gen alert, no distress No rash, cyanosis or gangrene Sclera anicteric, throat clear  No jvd or bruits Chest clear bilat to bases, no rales/ wheezing RRR no MRG Abd soft obese ntnd no mass or ascites +bs PD cath mid L abdomen w/ mild erythema at exit site +area of round erythema 3x 3 cm, 3 fingers below xiphoid process GU normal male MS no joint effusions or deformity Ext no LE or UE edema, no wounds or ulcers Neuro is alert, Ox 3 , nf      Renal-related home meds: - chlorthalidone 12.5  - valsartan 40  every day - norvasc 5 every day - coreg 6.25 bid    OP PD: does not have Rx yet as he is just starting training   Assessment/ Plan: Abd wall cellulitis - possibly related to recent PD cath placement 2 wks ago. Has had some abd pain, peritonitis is also a possibility although the stomach was not very tender. We are attempting to get fluid from the abdominal cavity to send off for cell ct and culture this afternoon. Will start empiric IV abx w/ vanc and fortaz per pharmacy.  ESRD - just had PD cath placed 2 wks ago. Has not started PD yet, was going to start training tomorrow. Labs /vol okay and no uremic symptoms. Pt can resume his traininig after he is dc'd. No plan to dialyze him at this time.  HTN/ BP - on 3-4 HTN meds at home. BP's are good here. Volume - no vol excess on exam.  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  MBD ckd - Ca in range, add on phos and albumin.     Vinson Moselle  MD [facility] 12/18/2022, 2:35 PM  Recent Labs  Lab 12/17/22 2045  HGB 8.7*  ALBUMIN 3.1*  CALCIUM 8.9  CREATININE 4.28*  K 4.1   Inpatient medications:  [START ON 12/19/2022] amLODipine  5 mg Oral Daily   carvedilol  6.25 mg Oral BID WC   docusate sodium  100 mg Oral BID   [START ON 12/19/2022] ferrous sulfate  325 mg Oral Q breakfast   gabapentin  300 mg  Oral BID   heparin  5,000 Units Subcutaneous Q8H   insulin aspart  0-5 Units Subcutaneous QHS   insulin aspart  0-9 Units Subcutaneous TID WC   senna  1 tablet Oral BID    cefTAZidime (FORTAZ)  IV     acetaminophen **OR** acetaminophen, albuterol, hydrALAZINE, HYDROmorphone (DILAUDID) injection

## 2022-12-18 NOTE — Consult Note (Addendum)
Hospital Consult    Reason for Consult:  PD catheter Requesting Physician:  [facility] MRN #:  [id number]  History of Present Illness: This is a 37 y.o. male with end-stage renal disease starting on peritoneal dialysis.  PD catheter was placed by Dr. Lenell Antu on 11/29/2022.  Patient reports this has been flushing well on a daily basis for the past 2 weeks without difficulty.  He believes his incisions are healing well.  He denies any drainage from any incisions.  He also denies any fevers, chills, nausea/vomiting.  He had an episode of right lower quadrant and flank abdominal pain which prompted him to present to the emergency department.  He has no tenderness over tunneled PD catheter.  Currently his abdominal pain has subsided.  Past Medical History:  Diagnosis Date   Anemia    Arthritis    "neck; maybe in my fingers" (11/06/2016)   Asthma    Bipolar disorder (HCC)    "IED: intentional deficit disorder"   Chronic kidney disease (CKD), stage III (moderate) (HCC)    Depression    Heart murmur    "when I was a child"   Hypertension    Type II diabetes mellitus (HCC)     Past Surgical History:  Procedure Laterality Date   AMPUTATION Left 11/05/2016   Procedure: AMPUTATION LEFT GREAT TOE;  Surgeon: Nadara Mustard, MD;  Location: [facility] OR;  Service: Orthopedics;  Laterality: Left;   BIOPSY  06/11/2022   Procedure: BIOPSY;  Surgeon: Shellia Cleverly, DO;  Location: [facility];  Service: Gastroenterology;;   CAPD INSERTION N/A 11/29/2022   Procedure: LAPAROSCOPIC INSERTION CONTINUOUS AMBULATORY PERITONEAL DIALYSIS  (CAPD) CATHETER;  Surgeon: Leonie Douglas, MD;  Location: [facility] OR;  Service: Vascular;  Laterality: N/A;   CATARACT EXTRACTION Left 07/2020   DRESSING CHANGE UNDER ANESTHESIA N/A 04/13/2016   Procedure: DRESSING CHANGE UNDER ANESTHESIA;  Surgeon: Luretha Murphy, MD;  Location: [facility] ORS;  Service: General;  Laterality: N/A;   ESOPHAGOGASTRODUODENOSCOPY (EGD) WITH PROPOFOL N/A 06/11/2022    Procedure: ESOPHAGOGASTRODUODENOSCOPY (EGD) WITH PROPOFOL;  Surgeon: Shellia Cleverly, DO;  Location: [facility];  Service: Gastroenterology;  Laterality: N/A;   INCISION AND DRAINAGE ABSCESS Right 09/03/2015   Procedure: INCISION AND DRAINAGE ABSCESS;  Surgeon: Dominica Severin, MD;  Location: [facility];  Service: Orthopedics;  Laterality: Right;   INCISION AND DRAINAGE PERIRECTAL ABSCESS N/A 04/11/2016   Procedure: IRRIGATION AND DEBRIDEMENT RECTAL ABSCESS;  Surgeon: Luretha Murphy, MD;  Location: [facility];  Service: General;  Laterality: N/A;   INGUINAL HERNIA REPAIR  1988; 1989   x2    IRRIGATION AND DEBRIDEMENT ABSCESS Left 08/31/2015   Procedure: IRRIGATION AND DEBRIDEMENT ABSCESS LEFT SHOULDER;  Surgeon: Ovidio Kin, MD;  Location: [facility];  Service: General;  Laterality: Left;   RETINAL LASER PROCEDURE Bilateral    TUMOR EXCISION     neck   WOUND DEBRIDEMENT N/A 02/24/2016   Procedure: SACRAL WOUND DEBRIDEMENT;  Surgeon: Violeta Gelinas, MD;  Location: [facility] OR;  Service: General;  Laterality: N/A;    No Known Allergies  Prior to Admission medications   Medication Sig Start Date End Date Taking? Authorizing Provider  acetaminophen (TYLENOL) 500 MG tablet Take 1,000 mg by mouth every 6 (six) hours as needed for moderate pain.   Yes [provider]  amLODipine (NORVASC) 10 MG tablet TAKE ONE TABLET BY MOUTH ONCE DAILY (AM) 08/29/22  Yes Marcine Matar, MD  carvedilol (COREG) 25 MG tablet TAKE 1 TABLET BY MOUTH 2 (TWO)  TIMES DAILY WITH A MEAL (AM+PM) 08/29/22  Yes Marcine Matar, MD  chlorthalidone (HYGROTON) 25 MG tablet Take 12.5 mg by mouth daily. 07/11/21  Yes [provider]  famotidine (PEPCID) 20 MG tablet TAKE 1 TABLET (20 MG TOTAL) BY MOUTH AT BEDTIME. 10/02/22  Yes Newlin, Enobong, MD  ferrous sulfate 325 (65 FE) MG tablet Take 1 tablet (325 mg total) by mouth daily with breakfast. 06/27/22  Yes Marcine Matar, MD  gabapentin (NEURONTIN) 300 MG capsule TAKE  1 CAPSULE (300 MG TOTAL) BY MOUTH 2 (TWO) TIMES DAILY. (AM+BEDTIME) Patient taking differently: Take 300 mg by mouth 2 (two) times daily. 08/30/22  Yes Marcine Matar, MD  pravastatin (PRAVACHOL) 20 MG tablet TAKE 1 TABLET (20 MG TOTAL) BY MOUTH DAILY. (BEDTIME) 08/29/22  Yes Marcine Matar, MD  valsartan (DIOVAN) 40 MG tablet TAKE 1 TABLET (40 MG TOTAL) BY MOUTH DAILY. (AM) 11/07/22  Yes Marcine Matar, MD  Continuous Glucose Sensor (DEXCOM G6 SENSOR) MISC USE DAILY AS DIRECTED. ONE SENSOR LAST FOR 10 DAYS. 11/07/22   Marcine Matar, MD  Continuous Glucose Transmitter (DEXCOM G6 TRANSMITTER) MISC USE AS DIRECTED DAILY 11/30/22   Marcine Matar, MD  JARDIANCE 10 MG TABS tablet TAKE ONE TABLET BY MOUTH ON MONDAY,WEDNESDAY AND FRIDAY (AM). 12/18/22   Marcine Matar, MD  oxyCODONE-acetaminophen (PERCOCET) 5-325 MG tablet Take 1 tablet by mouth every 6 (six) hours as needed for severe pain. 11/29/22   Rhyne, [place]es Coupe, PA-C    Social History   Socioeconomic History   Marital status: Single    Spouse name: Not on file   Number of children: 2   Years of education: Not on file   Highest education level: Not on file  Occupational History   Occupation: disabled  Tobacco Use   Smoking status: Never   Smokeless tobacco: Never  Vaping Use   Vaping status: Former   Substances: THC   Devices: THC  Substance and Sexual Activity   Alcohol use: Yes    Comment: 11/06/2016 "I'll drink a little at parties; maybe 5 times/year"   Drug use: Yes    Types: Marijuana    Comment: 11/06/2016 "stopped ~ 1 month ago"   Sexual activity: Never    Birth control/protection: Diaphragm  Other Topics Concern   Not on file  Social History Narrative   Not on file   Social Determinants of Health   Financial Resource Strain: Low Risk  (06/19/2022)   Overall Financial Resource Strain (CARDIA)    Difficulty of Paying Living Expenses: Not very hard  Food Insecurity: Food Insecurity Present (06/19/2022)    Hunger Vital Sign    Worried About Running Out of Food in the Last Year: Often true    Ran Out of Food in the Last Year: Often true  Transportation Needs: No Transportation Needs (06/19/2022)   PRAPARE - Administrator, Civil Service (Medical): No    Lack of Transportation (Non-Medical): No  Physical Activity: Insufficiently Active (06/19/2022)   Exercise Vital Sign    Days of Exercise per Week: 5 days    Minutes of Exercise per Session: 10 min  Stress: Stress Concern Present (06/19/2022)   [employer] of Occupational Health - Occupational Stress Questionnaire    Feeling of Stress : Very much  Social Connections: Socially Isolated (06/19/2022)   Social Connection and Isolation Panel [NHANES]    Frequency of Communication with Friends and Family: More than three times a week  Frequency of Social Gatherings with Friends and Family: More than three times a week    Attends Religious Services: Never    Database administrator or Organizations: No    Attends Banker Meetings: Never    Marital Status: Divorced  Catering manager Violence: Not At Risk (06/19/2022)   Humiliation, Afraid, Rape, and Kick questionnaire    Fear of Current or Ex-Partner: No    Emotionally Abused: No    Physically Abused: No    Sexually Abused: No     Family History  Problem Relation Age of Onset   Diabetes type II Mother    Coronary artery disease Mother    Diabetes type II Father    Colon cancer Maternal Grandfather    Lung cancer Maternal Grandfather    Prostate cancer Maternal Grandfather    Diabetes Maternal Grandfather    Hypertension Maternal Grandfather    Heart disease Paternal Grandfather    Parkinson's disease Paternal Aunt    Multiple sclerosis Paternal Aunt     ROS: Otherwise negative unless mentioned in HPI  Physical Examination  Vitals:   12/18/22 0716 12/18/22 0900  BP:  (!) 136/114  Pulse:  81  Resp:  20  Temp: 98.5 F (36.9 C)   SpO2:  100%   Body  mass index is 37.54 kg/m.  General:  WDWN in NAD Gait: Not observed HENT: WNL, normocephalic Pulmonary: normal non-labored breathing, without Rales, rhonchi,  wheezing Cardiac: regular Abdomen:  soft, NT/ND, no masses Skin: without rashes Vascular Exam/Pulses: symmetrical radial pulses Extremities: without ischemic changes, without Gangrene , without cellulitis; without open wounds;  Musculoskeletal: no muscle wasting or atrophy  Neurologic: A&O X 3;  No focal weakness or paresthesias are detected; speech is fluent/normal Psychiatric:  The pt has Normal affect. Lymph:  Unremarkable  CBC    Component Value Date/Time   WBC 15.3 (H) 12/17/2022 2045   RBC 2.96 (L) 12/17/2022 2045   HGB 8.7 (L) 12/17/2022 2045   HGB 9.7 (L) 06/25/2022 1627   HCT 27.3 (L) 12/17/2022 2045   HCT 28.7 (L) 06/25/2022 1627   PLT 246 12/17/2022 2045   PLT 258 06/25/2022 1627   MCV 92.2 12/17/2022 2045   MCV 90 06/25/2022 1627   MCH 29.4 12/17/2022 2045   MCHC 31.9 12/17/2022 2045   RDW 12.9 12/17/2022 2045   RDW 13.7 06/25/2022 1627   LYMPHSABS 1.4 12/17/2022 2045   LYMPHSABS 1.7 01/27/2020 1209   MONOABS 1.1 (H) 12/17/2022 2045   EOSABS 0.8 (H) 12/17/2022 2045   EOSABS 0.1 01/27/2020 1209   BASOSABS 0.1 12/17/2022 2045   BASOSABS 0.1 01/27/2020 1209    BMET    Component Value Date/Time   NA 138 12/17/2022 2045   NA 143 04/17/2021 1529   K 4.1 12/17/2022 2045   CL 106 12/17/2022 2045   CO2 19 (L) 12/17/2022 2045   GLUCOSE 107 (H) 12/17/2022 2045   BUN 65 (H) 12/17/2022 2045   BUN 37 (H) 04/17/2021 1529   CREATININE 4.28 (H) 12/17/2022 2045   CREATININE 0.73 04/10/2016 1125   CALCIUM 8.9 12/17/2022 2045   GFRNONAA 17 (L) 12/17/2022 2045   GFRNONAA >89 04/10/2016 1125   GFRAA 91 01/27/2020 1209   GFRAA >89 04/10/2016 1125    COAGS: Lab Results  Component Value Date   INR 0.97 04/11/2016   INR 1.12 02/23/2016     Non-Invasive Vascular Imaging:   Abdominal wall stranding with  fluid surrounding tunneled catheter  ASSESSMENT/PLAN: This is a 37 y.o. male status post peritoneal dialysis catheter placement on 11/29/2022  [name] [name] is a 37 year old male who underwent PD catheter placement by Dr. Lenell Antu on 11/29/2022.  The catheter has been flushing well without complications over the past 2 weeks.  He experienced a right lower quadrant and flank pain prompting him to present to the emergency department.  CT demonstrates likely postsurgical changes surrounding PD catheter tunneled and abdominal wall.  On exam skin is indurated overlying tunneled PD catheter however no significant redness.  Incisions are all well-healed without any drainage.  Catheter does not appear grossly infected.  Would favor trial of IV antibiotics.  Catheter fluid cultures pending.  We will try to avoid removal of PD catheter if at all possible.  We will notify Dr. Lenell Antu of the patient's admission.  On-call vascular surgeon Dr. Hetty Blend will evaluate the patient later today and provide further treatment plans.   Emilie Rutter PA-C [facility] [phone]   VASCULAR STAFF ADDENDUM: I have independently interviewed and examined the patient. I agree with the above.  PD cath does not appear grossly infected.  Continue abx with plan to keep catheter if possible  Daria Pastures MD [facility] Phone Number: [phone] 12/18/2022 1:34 PM

## 2022-12-19 DIAGNOSIS — Z794 Long term (current) use of insulin: Secondary | ICD-10-CM | POA: Diagnosis not present

## 2022-12-19 DIAGNOSIS — Z801 Family history of malignant neoplasm of trachea, bronchus and lung: Secondary | ICD-10-CM | POA: Diagnosis not present

## 2022-12-19 DIAGNOSIS — E11621 Type 2 diabetes mellitus with foot ulcer: Secondary | ICD-10-CM | POA: Diagnosis present

## 2022-12-19 DIAGNOSIS — Z79899 Other long term (current) drug therapy: Secondary | ICD-10-CM | POA: Diagnosis not present

## 2022-12-19 DIAGNOSIS — E1143 Type 2 diabetes mellitus with diabetic autonomic (poly)neuropathy: Secondary | ICD-10-CM | POA: Diagnosis present

## 2022-12-19 DIAGNOSIS — F319 Bipolar disorder, unspecified: Secondary | ICD-10-CM | POA: Diagnosis present

## 2022-12-19 DIAGNOSIS — E11319 Type 2 diabetes mellitus with unspecified diabetic retinopathy without macular edema: Secondary | ICD-10-CM | POA: Diagnosis present

## 2022-12-19 DIAGNOSIS — L97519 Non-pressure chronic ulcer of other part of right foot with unspecified severity: Secondary | ICD-10-CM | POA: Diagnosis present

## 2022-12-19 DIAGNOSIS — E785 Hyperlipidemia, unspecified: Secondary | ICD-10-CM | POA: Diagnosis present

## 2022-12-19 DIAGNOSIS — Y828 Other medical devices associated with adverse incidents: Secondary | ICD-10-CM | POA: Diagnosis present

## 2022-12-19 DIAGNOSIS — L03311 Cellulitis of abdominal wall: Secondary | ICD-10-CM | POA: Diagnosis present

## 2022-12-19 DIAGNOSIS — E1122 Type 2 diabetes mellitus with diabetic chronic kidney disease: Secondary | ICD-10-CM | POA: Diagnosis present

## 2022-12-19 DIAGNOSIS — D631 Anemia in chronic kidney disease: Secondary | ICD-10-CM | POA: Diagnosis present

## 2022-12-19 DIAGNOSIS — M898X9 Other specified disorders of bone, unspecified site: Secondary | ICD-10-CM | POA: Diagnosis present

## 2022-12-19 DIAGNOSIS — T827XXA Infection and inflammatory reaction due to other cardiac and vascular devices, implants and grafts, initial encounter: Secondary | ICD-10-CM | POA: Diagnosis not present

## 2022-12-19 DIAGNOSIS — I120 Hypertensive chronic kidney disease with stage 5 chronic kidney disease or end stage renal disease: Secondary | ICD-10-CM | POA: Diagnosis present

## 2022-12-19 DIAGNOSIS — K3184 Gastroparesis: Secondary | ICD-10-CM | POA: Diagnosis present

## 2022-12-19 DIAGNOSIS — J45909 Unspecified asthma, uncomplicated: Secondary | ICD-10-CM | POA: Diagnosis present

## 2022-12-19 DIAGNOSIS — T8571XA Infection and inflammatory reaction due to peritoneal dialysis catheter, initial encounter: Secondary | ICD-10-CM | POA: Diagnosis present

## 2022-12-19 DIAGNOSIS — D509 Iron deficiency anemia, unspecified: Secondary | ICD-10-CM | POA: Diagnosis present

## 2022-12-19 DIAGNOSIS — Z992 Dependence on renal dialysis: Secondary | ICD-10-CM | POA: Diagnosis not present

## 2022-12-19 DIAGNOSIS — N186 End stage renal disease: Secondary | ICD-10-CM | POA: Diagnosis present

## 2022-12-19 DIAGNOSIS — Z6837 Body mass index (BMI) 37.0-37.9, adult: Secondary | ICD-10-CM | POA: Diagnosis not present

## 2022-12-19 DIAGNOSIS — Z7984 Long term (current) use of oral hypoglycemic drugs: Secondary | ICD-10-CM | POA: Diagnosis not present

## 2022-12-19 DIAGNOSIS — N185 Chronic kidney disease, stage 5: Secondary | ICD-10-CM | POA: Diagnosis not present

## 2022-12-19 DIAGNOSIS — Z800 Family history of malignant neoplasm of digestive organs: Secondary | ICD-10-CM | POA: Diagnosis not present

## 2022-12-19 DIAGNOSIS — K659 Peritonitis, unspecified: Secondary | ICD-10-CM | POA: Diagnosis present

## 2022-12-19 LAB — GLUCOSE, CAPILLARY
Glucose-Capillary: 103 mg/dL — ABNORMAL HIGH (ref 70–99)
Glucose-Capillary: 106 mg/dL — ABNORMAL HIGH (ref 70–99)
Glucose-Capillary: 188 mg/dL — ABNORMAL HIGH (ref 70–99)
Glucose-Capillary: 218 mg/dL — ABNORMAL HIGH (ref 70–99)
Glucose-Capillary: 127 mg/dL — ABNORMAL HIGH (ref 70–99)

## 2022-12-19 LAB — BASIC METABOLIC PANEL
Anion gap: 10 (ref 5–15)
BUN: 56 mg/dL — ABNORMAL HIGH (ref 6–20)
CO2: 19 mmol/L — ABNORMAL LOW (ref 22–32)
Calcium: 8.6 mg/dL — ABNORMAL LOW (ref 8.9–10.3)
Chloride: 106 mmol/L (ref 98–111)
Creatinine, Ser: 3.98 mg/dL — ABNORMAL HIGH (ref 0.61–1.24)
GFR, Estimated: 19 mL/min — ABNORMAL LOW (ref >60)
Glucose, Bld: 103 mg/dL — ABNORMAL HIGH (ref 70–99)
Potassium: 4 mmol/L (ref 3.5–5.1)
Sodium: 135 mmol/L (ref 135–145)

## 2022-12-19 LAB — CBC
HCT: 25.1 % — ABNORMAL LOW (ref 39.0–52.0)
Hemoglobin: 8.7 g/dL — ABNORMAL LOW (ref 13.0–17.0)
MCH: 30.9 pg (ref 26.0–34.0)
MCHC: 34.7 g/dL (ref 30.0–36.0)
MCV: 89 fL (ref 80.0–100.0)
Platelets: 278 10*3/uL (ref 150–400)
RBC: 2.82 MIL/uL — ABNORMAL LOW (ref 4.22–5.81)
RDW: 12.7 % (ref 11.5–15.5)
WBC: 13.2 10*3/uL — ABNORMAL HIGH (ref 4.0–10.5)
nRBC: 0 % (ref 0.0–0.2)

## 2022-12-19 MED ORDER — GENTAMICIN SULFATE 0.1 % EX CREA
1.0000 | TOPICAL_CREAM | Freq: Every day | CUTANEOUS | Status: DC
  Administered 2022-12-19 – 2022-12-20 (×2): 1 via TOPICAL
  Filled 2022-12-19: qty 15

## 2022-12-19 MED ORDER — DELFLEX-LC/1.5% DEXTROSE 344 MOSM/L IP SOLN: INTRAPERITONEAL | Status: DC

## 2022-12-19 MED ORDER — HEPARIN SODIUM (PORCINE) 1000 UNIT/ML IJ SOLN
INTRAPERITONEAL | Status: DC | PRN
  Filled 2022-12-19: qty 6000

## 2022-12-19 MED ORDER — ALTEPLASE 2 MG IJ SOLR
2.0000 mg | INTRAMUSCULAR | Status: DC | PRN
  Administered 2022-12-19: 2 mg
  Filled 2022-12-19 (×2): qty 2

## 2022-12-19 MED ORDER — GENTAMICIN SULFATE 0.1 % EX CREA: 1.0000 | TOPICAL_CREAM | Freq: Every day | CUTANEOUS | Status: DC

## 2022-12-19 MED ORDER — DELFLEX-LC/1.5% DEXTROSE 344 MOSM/L IP SOLN: 500.0000 mL | INTRAPERITONEAL | Status: DC | PRN

## 2022-12-19 NOTE — Progress Notes (Signed)
[name] [name] Progress Note  Subjective: we did attempt PD cath flush yesterday.  Fluid went in okay but when tried to drain out there was fibrin and no fluid was able to be drained despite all the usual efforts to reposition the patient etc.  Today pt is having just "mild " abd pain. No new c/o's. Labs stable.   Vitals:   12/18/22 2100 12/19/22 0014 12/19/22 0300 12/19/22 0819  BP: 122/65 (!) 144/101 137/81 135/79  Pulse: 88 84 80 75  Resp: 19 20 18 18   Temp: 98.6 F (37 C) 98.7 F (37.1 C) 98.4 F (36.9 C) 98.8 F (37.1 C)  TempSrc: Oral Oral Oral Oral  SpO2: 100% 98% 98% 99%  Weight:      Height:        Exam: Gen alert, no distress No rash, cyanosis or gangrene Sclera anicteric, throat clear  No jvd or bruits Chest clear bilat to bases RRR no MRG Abd soft obese ntnd no mass or ascites +bs PD cath mid L abdomen w/ mild erythema at exit site +area of round erythema 3x 3 cm, 3 fingers below xiphoid process Ext no LE or UE edema Neuro is alert, Ox 3 , nf       Renal-related home meds: - chlorthalidone 12.5  - valsartan 40 every day - norvasc 5 every day - coreg 6.25 bid      OP PD: does not have Rx yet as he is just starting training     Assessment/ Plan: Abd wall cellulitis - possibly related to recent PD cath placement 2 wks ago. Has had some abd pain, peritonitis is also a possibility although the stomach was not very tender. We were unable to get fluid back yesterday after instilling 1 L it would not drain, and there was fibrin seen in the tube. This could be consequence of peritonitis. He is on IV abx. Today we are planning TPA instillation and will flush catheter after TPA and if it works we will plan on gentle PD overnight at volumes of 750 cc in and out. Also if we can get fluid out will send for cell ct and culture.  ESRD - just had PD cath placed 2 wks ago. Has not started PD yet, was just about to start training. Labs /vol okay and no uremic  symptoms. Does not require dialysis now.  HTN/ BP - on 3-4 HTN meds at home. BP's are good here. Volume - no vol excess on exam.  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 Follow.       Vinson Moselle MD  [facility] 12/19/2022, 2:03 PM  Recent Labs  Lab 12/17/22 2045 12/18/22 1448 12/19/22 0448  HGB 8.7*  --  8.7*  ALBUMIN 3.1* 2.9*  --   CALCIUM 8.9  --  8.6*  PHOS  --  3.6  --   CREATININE 4.28*  --  3.98*  K 4.1  --  4.0   No results for input(s): "IRON", "TIBC", "FERRITIN" in the last 168 hours. Inpatient medications:  amLODipine  5 mg Oral Daily   carvedilol  6.25 mg Oral BID WC   docusate sodium  100 mg Oral BID   ferrous sulfate  325 mg Oral Q breakfast   gabapentin  300 mg Oral BID   gentamicin cream  1 Application Topical Daily   heparin  5,000 Units Subcutaneous Q8H   insulin aspart  0-5 Units Subcutaneous QHS  insulin aspart  0-9 Units Subcutaneous TID WC   senna  1 tablet Oral BID    cefTAZidime (FORTAZ)  IV Stopped (12/18/22 2156)   dialysis solution 1.5% low-MG/low-CA     dialysis solution 1.5% low-MG/low-CA     dialysis solution 1.5% low-MG/low-CA     acetaminophen **OR** acetaminophen, albuterol, alteplase **AND** dialysis solution 1.5% low-MG/low-CA, hydrALAZINE, HYDROmorphone (DILAUDID) injection

## 2022-12-19 NOTE — Progress Notes (Signed)
PROGRESS NOTE  [name] [name]  DOB: 1985/05/29  PCP: Marcine Matar, MD VZD:[id number]  DOA: 12/17/2022  LOS: 0 days  Hospital Day: 3  Brief narrative: [name] [name] is a 37 y.o. male with PMH significant for ESRD on PD, DM 2, HTN, chronic anemia, depression, bipolar disorder who recently underwent a PD catheter placed by vascular surgery Dr. Lenell Antu on 9/12 Patient was brought to the ED from home by EMS last night for lower abdominal pain 8/10.  EMS gave 100 mcg of fentanyl on the way.  Patient reports onset of abdominal pain 1 to 2 days ago which had progressively worsened and generalized.  No fever, nausea vomiting, diarrhea.  In the ED, patient was afebrile, hemodynamically, breathing on room air Labs with WC count 15.3, hemoglobin 8.7, platelet 246, BUN/creatinine 65/4.28 CT abdomen pelvis showed cellulitis and a small volume fluid at PD catheter access site.  No evidence of organized abscess.  It also showed nonspecific perinephric fat stranding. Some hazy stranding about the renal pelvises also nonspecific but correlate for upper urinary tract infection. Urinalysis showed clear straw-colored urine with negative leukocytes, rare bacteria Blood culture was sent. Ad[facility] to [facility] Nephrology was consulted.  Subjective: Patient was seen and examined this morning. Pleasant young Caucasian male.  Lying on bed. Abdominal pain improving.  Less tender today.  Assessment/Plan: Abdominal wall cellulitis  Suspected peritonitis Presented with progressive abdominal pain and cellulitis near the site of recently PD No fever but has leukocytosis and significant abdominal tenderness Nephrology on board.  Peritoneal fluid collection was attempted.  Patient was started on empiric antibiotic coverage  Abdominal pain improving.  WBC count improving. Continue to monitor blood work. For pain control, continue as needed Dilaudid Recent Labs  Lab 12/17/22 2045 12/19/22 0448  WBC 15.3* 13.2*    ESRD on PD Nephrology following  Had catheter placed 2 weeks ago.  Has not started using it yet.  Currently with no uremic symptoms.  Type 2 diabetes mellitus Diabetic neuropathy A1c 6.5 on 12/18/2022 PTA meds-on Jardiance MWF Currently on SSI/Accu-Cheks Continue gabapentin Recent Labs  Lab 12/18/22 1607 12/18/22 2123 12/19/22 0630 12/19/22 0819 12/19/22 1213  GLUCAP 89 216* 103* 106* 188*   Hypertension PTA meds- carvedilol 25 mg twice daily, chlorthalidone 12.5 mg daily, amlodipine 10 mg daily, valsartan 40 mg daily, Jardiance 10 mg MWF.   Currently blood pressure is controlled only on low-dose of carvedilol 6.25 mg twice daily and amlodipine 5 mg daily  Continue to monitor blood pressure.  HLD Continue pravastatin  Chronic anemia Chronically low hemoglobin due to iron deficiency and CKD Recent Labs    06/25/22 1627 11/29/22 0631 12/17/22 2045 12/19/22 0448  HGB 9.7* 9.2* 8.7* 8.7*  MCV 90  --  92.2 89.0  FERRITIN 66  --   --   --   TIBC 258  --   --   --   IRON 43  --   --   --    depression, bipolar disorder Not on meds??  Mobility: Encourage ambulation  Goals of care   Code Status: Full Code    DVT prophylaxis:  heparin injection 5,000 Units Start: 12/18/22 0830   Antimicrobials: Currently on IV ceftazidime Fluid: None Consultants: Nephrology, vascular surgery Family Communication: None at bedside  Status: Inpatient Level of care:  Med-Surg   Patient from: Home Anticipated d/c to: Hopefully home in 1 to 2 days Needs to continue in-hospital care:  Needs IV antibiotics  Scheduled Meds:  amLODipine  5  mg Oral Daily   carvedilol  6.25 mg Oral BID WC   docusate sodium  100 mg Oral BID   ferrous sulfate  325 mg Oral Q breakfast   gabapentin  300 mg Oral BID   gentamicin cream  1 Application Topical Daily   gentamicin cream  1 Application Topical Daily   heparin  5,000 Units Subcutaneous Q8H   insulin aspart  0-5 Units Subcutaneous QHS    insulin aspart  0-9 Units Subcutaneous TID WC   senna  1 tablet Oral BID    PRN meds: acetaminophen **OR** acetaminophen, albuterol, alteplase **AND** dialysis solution 1.5% low-MG/low-CA, hydrALAZINE, HYDROmorphone (DILAUDID) injection   Infusions:   cefTAZidime (FORTAZ)  IV Stopped (12/18/22 2156)   dialysis solution 1.5% low-MG/low-CA     dialysis solution 1.5% low-MG/low-CA     dialysis solution 1.5% low-MG/low-CA     dialysis solution 1.5% low-MG/low-CA      Diet:  Diet Order             Diet renal with fluid restriction Fluid restriction: 1200 mL Fluid; Room service appropriate? Yes; Fluid consistency: Thin  Diet effective now                   Antimicrobials: Anti-infectives (From admission, onward)    Start     Dose/Rate Route Frequency Ordered Stop   12/18/22 1800  cefTAZidime (FORTAZ) 1 g in sodium chloride 0.9 % 100 mL IVPB        1 g 200 mL/hr over 30 Minutes Intravenous Every 24 hours 12/18/22 1330         Objective: Vitals:   12/19/22 0300 12/19/22 0819  BP: 137/81 135/79  Pulse: 80 75  Resp: 18 18  Temp: 98.4 F (36.9 C) 98.8 F (37.1 C)  SpO2: 98% 99%    Intake/Output Summary (Last 24 hours) at 12/19/2022 1413 Last data filed at 12/19/2022 0317 Gross per 24 hour  Intake 576 ml  Output 250 ml  Net 326 ml   Filed Weights   12/18/22 0435  Weight: 112 kg   Weight change:  Body mass index is 37.54 kg/m.   Physical Exam: General exam: Pleasant, young male.  Not in distress Skin: No rashes, lesions or ulcers. HEENT: Atraumatic, normocephalic, no obvious bleeding Lungs: Clear to auscultation bilaterally CVS: Regular rate and rhythm, no murmur GI/Abd small area of cellulitis on intra-abdominal wall near PD catheter access site.  Abdominal firmness and tenderness improving. CNS: Alert, awake, alert x 3 Psychiatry: Mood appropriate Extremities: No pedal edema, no calf tenderness  Data Review: I have personally reviewed the laboratory  data and studies available.  F/u labs  Unresulted Labs (From admission, onward)     Start     Ordered   12/19/22 1031  MRSA Next Gen by PCR, Nasal  Once,   R        12/19/22 1031   12/18/22 0710  Body fluid cell count with differential  Once,   URGENT       Question:  Are there also cytology or pathology orders on this specimen?  Answer:  No   12/18/22 0709   12/18/22 0534  Body fluid culture w Gram Stain  (Peritoneal fluid analysis panel (pnl))  ONCE - URGENT,   URGENT       Question:  Are there also cytology or pathology orders on this specimen?  Answer:  No   12/18/22 0533  Total time spent in review of labs and imaging, patient evaluation, formulation of plan, documentation and communication with family: 45 minutes  Signed, Lorin Glass, MD [facility] 12/19/2022

## 2022-12-19 NOTE — Progress Notes (Addendum)
;  o      Patient states the erythema is slightly better  Lungs non labored breathing Abdomin distended    A/P: PD catheter placement 11/29/22 with abdominal wall cellulitis  He is afebrile with mild leukocytosis 13.2 Plan to continue antibiotics and to maintain the PD catheter for now.  Patient states it is functioning well  Mosetta Pigeon PA-C [facility]  VASCULAR STAFF ADDENDUM: I have independently interviewed and examined the patient. I agree with the above.  Continue abx with plan to maintain catheter.  Daria Pastures MD [facility] Phone Number: [phone] 12/19/2022 11:24 AM

## 2022-12-19 NOTE — Plan of Care (Signed)

## 2022-12-20 ENCOUNTER — Inpatient Hospital Stay (HOSPITAL_COMMUNITY): Payer: Medicaid Other

## 2022-12-20 DIAGNOSIS — L03311 Cellulitis of abdominal wall: Secondary | ICD-10-CM | POA: Diagnosis not present

## 2022-12-20 LAB — CBC WITH DIFFERENTIAL/PLATELET
Abs Immature Granulocytes: 0.09 10*3/uL — ABNORMAL HIGH (ref 0.00–0.07)
Basophils Absolute: 0.1 10*3/uL (ref 0.0–0.1)
Basophils Relative: 0 %
Eosinophils Absolute: 0.3 10*3/uL (ref 0.0–0.5)
Eosinophils Relative: 2 %
HCT: 27.9 % — ABNORMAL LOW (ref 39.0–52.0)
Hemoglobin: 9 g/dL — ABNORMAL LOW (ref 13.0–17.0)
Immature Granulocytes: 1 %
Lymphocytes Relative: 8 %
Lymphs Abs: 1.3 10*3/uL (ref 0.7–4.0)
MCH: 29.1 pg (ref 26.0–34.0)
MCHC: 32.3 g/dL (ref 30.0–36.0)
MCV: 90.3 fL (ref 80.0–100.0)
Monocytes Absolute: 1.5 10*3/uL — ABNORMAL HIGH (ref 0.1–1.0)
Monocytes Relative: 9 %
Neutro Abs: 13.5 10*3/uL — ABNORMAL HIGH (ref 1.7–7.7)
Neutrophils Relative %: 80 %
Platelets: 274 10*3/uL (ref 150–400)
RBC: 3.09 MIL/uL — ABNORMAL LOW (ref 4.22–5.81)
RDW: 12.8 % (ref 11.5–15.5)
WBC: 16.7 10*3/uL — ABNORMAL HIGH (ref 4.0–10.5)
nRBC: 0 % (ref 0.0–0.2)

## 2022-12-20 LAB — GLUCOSE, CAPILLARY
Glucose-Capillary: 152 mg/dL — ABNORMAL HIGH (ref 70–99)
Glucose-Capillary: 217 mg/dL — ABNORMAL HIGH (ref 70–99)
Glucose-Capillary: 183 mg/dL — ABNORMAL HIGH (ref 70–99)
Glucose-Capillary: 165 mg/dL — ABNORMAL HIGH (ref 70–99)

## 2022-12-20 LAB — BASIC METABOLIC PANEL
Anion gap: 9 (ref 5–15)
BUN: 52 mg/dL — ABNORMAL HIGH (ref 6–20)
CO2: 19 mmol/L — ABNORMAL LOW (ref 22–32)
Calcium: 8.7 mg/dL — ABNORMAL LOW (ref 8.9–10.3)
Chloride: 105 mmol/L (ref 98–111)
Creatinine, Ser: 4.06 mg/dL — ABNORMAL HIGH (ref 0.61–1.24)
GFR, Estimated: 19 mL/min — ABNORMAL LOW (ref >60)
Glucose, Bld: 139 mg/dL — ABNORMAL HIGH (ref 70–99)
Potassium: 4 mmol/L (ref 3.5–5.1)
Sodium: 133 mmol/L — ABNORMAL LOW (ref 135–145)

## 2022-12-20 LAB — MRSA NEXT GEN BY PCR, NASAL: MRSA by PCR Next Gen: NOT DETECTED

## 2022-12-20 MED ORDER — VANCOMYCIN HCL 2000 MG/400ML IV SOLN
2000.0000 mg | Freq: Once | INTRAVENOUS | Status: DC
  Filled 2022-12-20: qty 400

## 2022-12-20 MED ORDER — VANCOMYCIN VARIABLE DOSE PER UNSTABLE RENAL FUNCTION (PHARMACIST DOSING): Status: DC

## 2022-12-20 MED ORDER — VANCOMYCIN HCL 1750 MG/350ML IV SOLN
1750.0000 mg | Freq: Once | INTRAVENOUS | Status: AC
  Administered 2022-12-20: 1750 mg via INTRAVENOUS
  Filled 2022-12-20: qty 350

## 2022-12-20 NOTE — Progress Notes (Signed)
VASCULAR LAB    Upper extremity vein mapping has been performed.  See CV proc for preliminary results.   Kenni Newton, RVT 12/20/2022, 3:42 PM

## 2022-12-20 NOTE — Progress Notes (Signed)
[facility] Progress Note  Subjective: we did TPA the PD cath yesterday due to malfunction (not draining). He was hooked up to the machine last night but the catheter was not functioning and PD was stopped shortly after starting.   Vitals:   12/19/22 1643 12/19/22 2025 12/20/22 0500 12/20/22 0749  BP: (!) 140/90 (!) 144/80  134/70  Pulse: 100 91  80  Resp:      Temp: 98.3 F (36.8 C) 99.5 F (37.5 C)  99.5 F (37.5 C)  TempSrc:  Oral  Oral  SpO2: 100% 100%  99%  Weight:   111 kg   Height:        Exam: Gen alert, no distress No rash, cyanosis or gangrene Sclera anicteric, throat clear  No jvd or bruits Chest clear bilat to bases RRR no MRG Abd soft obese ntnd no mass or ascites +bs PD cath mid L abdomen w/ mild erythema at exit site +area of round erythema 3x 3 cm, 3 fingers below xiphoid process Ext no LE or UE edema Neuro is alert, Ox 3 , nf       Renal-related home meds: - chlorthalidone 12.5  - valsartan 40 every day - norvasc 5 every day - coreg 6.25 bid      OP PD: does not have Rx yet as he is just starting training     Assessment/ Plan: Abd wall cellulitis/ possible peritonitis/ CKD 5 - possibly related to recent PD cath placement 2 wks ago. Had some abd pain and peritonitis also a possibility. We were unable to get fluid back Friday - after instilling 1 L it would not drain, and there was fibrin seen in the tube. Yesterday we TPA'd the catheter in the afternoon and in the evening attempted PD but the catheter was not functioning. Attempt at PD was aborted. Have d/w VVS this am. Will need PD cath removal. Will cont IV abx (re-ordered vanc since it hasn't been started).  As for other access- have d/w VVS and Dr Valentino Nose - will plan for new arm access since his uremic symptoms aren't severe. Will follow.  HTN/ BP - on 3-4 HTN meds at home. BP's are good here. Volume - no vol excess on exam.  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  MBD ckd - Ca and phos are  in range. Follow.       Vinson Moselle MD  [facility] 12/20/2022, 1:31 PM  Recent Labs  Lab 12/17/22 2045 12/18/22 1448 12/19/22 0448 12/20/22 0457  HGB 8.7*  --  8.7* 9.0*  ALBUMIN 3.1* 2.9*  --   --   CALCIUM 8.9  --  8.6* 8.7*  PHOS  --  3.6  --   --   CREATININE 4.28*  --  3.98* 4.06*  K 4.1  --  4.0 4.0   No results for input(s): "IRON", "TIBC", "FERRITIN" in the last 168 hours. Inpatient medications:  amLODipine  5 mg Oral Daily   carvedilol  6.25 mg Oral BID WC   docusate sodium  100 mg Oral BID   ferrous sulfate  325 mg Oral Q breakfast   gabapentin  300 mg Oral BID   gentamicin cream  1 Application Topical Daily   heparin  5,000 Units Subcutaneous Q8H   insulin aspart  0-5 Units Subcutaneous QHS   insulin aspart  0-9 Units Subcutaneous TID WC   senna  1 tablet Oral BID    cefTAZidime (FORTAZ)  IV 200 mL/hr at 12/20/22 [phone]  acetaminophen **OR** acetaminophen, albuterol, heparin sodium (porcine) 3,000 Units in dialysis solution 1.5% low-MG/low-CA 6,000 mL dialysis solution, hydrALAZINE

## 2022-12-20 NOTE — Progress Notes (Signed)
CCPD treatment not able to perform due to malfunctioning access.Alteplase procedure to lose fibrins was unsuccessful, Treatment stop at 0215hrs. Nephrologist on call notified and informed.

## 2022-12-20 NOTE — Plan of Care (Signed)

## 2022-12-20 NOTE — Progress Notes (Signed)
PROGRESS NOTE  [name] [name]  DOB: [birth year]  PCP: [name] Matar, MD NWG:[id number]  DOA: 12/17/2022  LOS: 1 day  Hospital Day: 4  Brief narrative: [name] [name] is a 37 y.o. male with PMH significant for ESRD on PD, DM 2, HTN, chronic anemia, depression, bipolar disorder who recently underwent a PD catheter placed by vascular surgery Dr. Lenell [name] on 9/12 Patient was brought to the ED from home by EMS last night for lower abdominal pain 8/10.  EMS gave 100 mcg of fentanyl on the way.  Patient reports onset of abdominal pain 1 to 2 days ago which had progressively worsened and generalized.  No fever, nausea vomiting, diarrhea.  In the ED, patient was afebrile, hemodynamically, breathing on room air Labs with WC count 15.3, hemoglobin 8.7, platelet 246, BUN/creatinine 65/4.28 CT abdomen pelvis showed cellulitis and a small volume fluid at PD catheter access site.  No evidence of organized abscess.  It also showed nonspecific perinephric fat stranding. Some hazy stranding about the renal pelvises also nonspecific but correlate for upper urinary tract infection. Urinalysis showed clear straw-colored urine with negative leukocytes, rare bacteria Blood culture was sent. Admitted to [facility] Nephrology was consulted.  Subjective: Patient was seen and examined this morning. Sleeping, opens eyes to verbal command.  Not in distress.  Assessment/Plan: Abdominal wall cellulitis  Suspected peritonitis Presented with progressive abdominal pain and cellulitis near the site of recently PD No fever but has leukocytosis but had some abdominal tenderness. Nephrology on board.  Peritoneal fluid collection was attempted but unable to collect because of a lot of fibrin in the tube.  Patient was started on empiric antibiotic coverage  Abdominal pain improving.  WBC count improving. Continue to monitor blood work. For pain control, continue as needed Dilaudid Recent Labs  Lab 12/17/22 2045 12/19/22 0448  12/20/22 0457  WBC 15.3* 13.2* 16.7*   ESRD on PD Nephrology following  Had catheter placed 2 weeks ago.  Has not started using it yet.  Currently with no uremic symptoms.  Type 2 diabetes mellitus Diabetic neuropathy A1c 6.5 on 12/18/2022 PTA meds-on Jardiance MWF Currently on SSI/Accu-Cheks only. Continue gabapentin Recent Labs  Lab 12/19/22 0819 12/19/22 1213 12/19/22 1643 12/19/22 2035 12/20/22 0855  GLUCAP 106* 188* 218* 127* 152*   Hypertension PTA meds- carvedilol 25 mg twice daily, chlorthalidone 12.5 mg daily, amlodipine 10 mg daily, valsartan 40 mg daily, Jardiance 10 mg MWF.   Currently blood pressure is controlled only on low-dose of carvedilol 6.25 mg twice daily and amlodipine 5 mg daily  Continue to monitor blood pressure.  HLD Continue pravastatin  Chronic anemia Chronically has low hemoglobin due to iron deficiency and CKD Recent Labs    06/25/22 1627 11/29/22 0631 12/17/22 2045 12/17/22 2045 12/19/22 0448 12/20/22 0457  HGB 9.7* 9.2* 8.7*  --  8.7* 9.0*  MCV 90  --  92.2   < > 89.0 90.3  FERRITIN 66  --   --   --   --   --   TIBC 258  --   --   --   --   --   IRON 43  --   --   --   --   --    < > = values in this interval not displayed.   Depression, bipolar disorder Not on meds??  Mobility: Encourage ambulation  Goals of care   Code Status: Full Code    DVT prophylaxis:  heparin injection 5,000 Units Start: 12/18/22 0830  Antimicrobials: Currently on IV ceftazidime Fluid: None Consultants: Nephrology, vascular surgery Family Communication: None at bedside  Status: Inpatient Level of care:  Med-Surg   Patient from: Home Anticipated d/c to: Hopefully home in 1 to 2 days after clearance by nephrology Needs to continue in-hospital care:  Needs IV antibiotics  Scheduled Meds:  amLODipine  5 mg Oral Daily   carvedilol  6.25 mg Oral BID WC   docusate sodium  100 mg Oral BID   ferrous sulfate  325 mg Oral Q breakfast    gabapentin  300 mg Oral BID   gentamicin cream  1 Application Topical Daily   heparin  5,000 Units Subcutaneous Q8H   insulin aspart  0-5 Units Subcutaneous QHS   insulin aspart  0-9 Units Subcutaneous TID WC   senna  1 tablet Oral BID    PRN meds: acetaminophen **OR** acetaminophen, albuterol, alteplase **AND** dialysis solution 1.5% low-MG/low-CA, heparin sodium (porcine) 3,000 Units in dialysis solution 1.5% low-MG/low-CA 6,000 mL dialysis solution, hydrALAZINE   Infusions:   cefTAZidime (FORTAZ)  IV 200 mL/hr at 12/20/22 0452   dialysis solution 1.5% low-MG/low-CA     dialysis solution 1.5% low-MG/low-CA      Diet:  Diet Order             Diet renal with fluid restriction Fluid restriction: 1200 mL Fluid; Room service appropriate? Yes; Fluid consistency: Thin  Diet effective now                   Antimicrobials: Anti-infectives (From admission, onward)    Start     Dose/Rate Route Frequency Ordered Stop   12/18/22 1800  cefTAZidime (FORTAZ) 1 g in sodium chloride 0.9 % 100 mL IVPB        1 g 200 mL/hr over 30 Minutes Intravenous Every 24 hours 12/18/22 1330         Objective: Vitals:   12/19/22 2025 12/20/22 0749  BP: (!) 144/80 134/70  Pulse: 91 80  Resp:    Temp: 99.5 F (37.5 C) 99.5 F (37.5 C)  SpO2: 100% 99%    Intake/Output Summary (Last 24 hours) at 12/20/2022 1110 Last data filed at 12/20/2022 0452 Gross per 24 hour  Intake 100 ml  Output --  Net 100 ml   Filed Weights   12/18/22 0435 12/20/22 0500  Weight: 112 kg 111 kg   Weight change:  Body mass index is 37.21 kg/m.   Physical Exam: General exam: Pleasant, young male.  Not in distress Skin: No rashes, lesions or ulcers. HEENT: Atraumatic, normocephalic, no obvious bleeding Lungs: Clear to auscultation bilaterally CVS: Regular rate and rhythm, no murmur GI/Abd small area of cellulitis on intra-abdominal wall near PD catheter access site.  Only mild tenderness today. CNS:  Somnolent, opens eyes on verbal command.  Oriented x 3. Psychiatry: Mood appropriate Extremities: No pedal edema, no calf tenderness  Data Review: I have personally reviewed the laboratory data and studies available.  F/u labs  Unresulted Labs (From admission, onward)     Start     Ordered   12/19/22 1031  MRSA Next Gen by PCR, Nasal  Once,   R        12/19/22 1031   12/18/22 0710  Body fluid cell count with differential  Once,   URGENT       Question:  Are there also cytology or pathology orders on this specimen?  Answer:  No   12/18/22 0709   12/18/22 0534  Body  fluid culture w Gram Stain  (Peritoneal fluid analysis panel (pnl))  ONCE - URGENT,   URGENT       Question:  Are there also cytology or pathology orders on this specimen?  Answer:  No   12/18/22 0533            Total time spent in review of labs and imaging, patient evaluation, formulation of plan, documentation and communication with family: 45 minutes  Signed, Lorin Glass, MD [facility] 12/20/2022

## 2022-12-20 NOTE — Progress Notes (Signed)
[facility] [place] PROGRESS NOTE  ASSESSMENT / PLAN: [name] [name] is a 37 y.o. male s/p recent laparoscopic peritoneal dialysis catheter placement. Concern for peritonitis and nonfunctional PD catheter. This was previously working. No technical problems on CT scan. Will plan to remove this tomorrow and create new, right sided AV access. Reviewed plan with patient who is amenable. Keep NPO after midnight.   SUBJECTIVE: PD catheter not working. Returning fibrin. RLQ pain with flushing. Concern for peritonitis.  OBJECTIVE: BP 134/70   Pulse 80   Temp 99.5 F (37.5 C) (Oral)   Resp 18   Ht 5\' 8"  (1.727 m)   Wt 111 kg   SpO2 99%   BMI 37.21 kg/m   Intake/Output Summary (Last 24 hours) at 12/20/2022 1120 Last data filed at 12/20/2022 0452 Gross per 24 hour  Intake 100 ml  Output --  Net 100 ml    No distress Mild erythema of abdominal wall.  Mild abdominal tenderness     Latest Ref Rng & Units 12/20/2022    4:57 AM 12/19/2022    4:48 AM 12/17/2022    8:45 PM  CBC  WBC 4.0 - 10.5 K/uL 16.7  13.2  15.3   Hemoglobin 13.0 - 17.0 g/dL 9.0  8.7  8.7   Hematocrit 39.0 - 52.0 % 27.9  25.1  27.3   Platelets 150 - 400 K/uL 274  278  246         Latest Ref Rng & Units 12/20/2022    4:57 AM 12/19/2022    4:48 AM 12/17/2022    8:45 PM  CMP  Glucose 70 - 99 mg/dL 161  096  045   BUN 6 - 20 mg/dL 52  56  65   Creatinine 0.61 - 1.24 mg/dL 4.09  8.11  9.14   Sodium 135 - 145 mmol/L 133  135  138   Potassium 3.5 - 5.1 mmol/L 4.0  4.0  4.1   Chloride 98 - 111 mmol/L 105  106  106   CO2 22 - 32 mmol/L 19  19  19    Calcium 8.9 - 10.3 mg/dL 8.7  8.6  8.9   Total Protein 6.5 - 8.1 g/dL   6.9   Total Bilirubin 0.3 - 1.2 mg/dL   0.6   Alkaline Phos 38 - 126 U/L   66   AST 15 - 41 U/L   8   ALT 0 - 44 U/L   12     Estimated Creatinine Clearance: 30.4 mL/min (A) (by C-G formula based on SCr of 4.06 mg/dL (H)).  Rande Brunt. Lenell Antu, MD [facility] Phone Number: [phone] 12/20/2022 11:20 AM

## 2022-12-20 NOTE — Progress Notes (Signed)
Pharmacy Antibiotic Note  [name] [name] is a 37 y.o. male admitted on 12/17/2022 with abdominal pain. Recently underwent PD catheter placement on 9/12. CTAP showed cellulitis and small volume fluid at PD cath site. Pharmacy has been consulted for ceftazidime and vancomycin dosing. Planning for PD catheter removal and AVG formation 10/4. D/w nephrology, not planning for RRT during this admission - only prepping access.   Cell count from peritoneal fluid unable to be obtained due to fibrin. Creatinine stable 4's. No urgent dialysis needs. Minimal UOP  Plan: Give vancomycin 1750 mg IV x1 as loading dose (Using Vd 0.5L/kg) Dose vancomycin per levels for now - redose once VR < 20 Continue ceftazidime 1g IV q24 hours (borderline for dose adjustment to q12 hours)  Height: 5\' 8"  (172.7 cm) Weight: 111 kg (244 lb 11.4 oz) IBW/kg (Calculated) : 68.4  Temp (24hrs), Avg:99.1 F (37.3 C), Min:98.3 F (36.8 C), Max:99.5 F (37.5 C)  Recent Labs  Lab 12/17/22 2045 12/19/22 0448 12/20/22 0457  WBC 15.3* 13.2* 16.7*  CREATININE 4.28* 3.98* 4.06*    Estimated Creatinine Clearance: 30.4 mL/min (A) (by C-G formula based on SCr of 4.06 mg/dL (H)).    No Known Allergies  Antimicrobials this admission: Ceftazidime 10/1 >>  Vancomycin 10/3 >>  Microbiology results: 10/1 BCx: ngtd  Thank you for allowing pharmacy to be a part of this patient's care.  Rexford Maus, PharmD, BCPS 12/20/2022 1:49 PM

## 2022-12-20 NOTE — Plan of Care (Signed)

## 2022-12-21 ENCOUNTER — Inpatient Hospital Stay (HOSPITAL_COMMUNITY): Payer: Medicaid Other | Admitting: Certified Registered"

## 2022-12-21 ENCOUNTER — Other Ambulatory Visit: Payer: Self-pay

## 2022-12-21 ENCOUNTER — Encounter (HOSPITAL_COMMUNITY)
Admission: EM | Disposition: A | Discharge status: Home / Self Care | Payer: Self-pay | Source: Home / Self Care | Attending: Internal Medicine

## 2022-12-21 ENCOUNTER — Encounter (HOSPITAL_COMMUNITY): Payer: Self-pay | Admitting: Internal Medicine

## 2022-12-21 DIAGNOSIS — I120 Hypertensive chronic kidney disease with stage 5 chronic kidney disease or end stage renal disease: Secondary | ICD-10-CM

## 2022-12-21 DIAGNOSIS — Z992 Dependence on renal dialysis: Secondary | ICD-10-CM

## 2022-12-21 DIAGNOSIS — T827XXA Infection and inflammatory reaction due to other cardiac and vascular devices, implants and grafts, initial encounter: Secondary | ICD-10-CM

## 2022-12-21 DIAGNOSIS — N185 Chronic kidney disease, stage 5: Secondary | ICD-10-CM

## 2022-12-21 DIAGNOSIS — E1122 Type 2 diabetes mellitus with diabetic chronic kidney disease: Secondary | ICD-10-CM

## 2022-12-21 DIAGNOSIS — N186 End stage renal disease: Secondary | ICD-10-CM

## 2022-12-21 DIAGNOSIS — L03311 Cellulitis of abdominal wall: Secondary | ICD-10-CM | POA: Diagnosis not present

## 2022-12-21 HISTORY — PX: AV FISTULA PLACEMENT: SHX1204

## 2022-12-21 HISTORY — PX: CAPD REMOVAL: SHX5234

## 2022-12-21 LAB — VANCOMYCIN, RANDOM: Vancomycin Rm: 23 ug/mL

## 2022-12-21 LAB — BASIC METABOLIC PANEL
Anion gap: 13 (ref 5–15)
BUN: 50 mg/dL — ABNORMAL HIGH (ref 6–20)
CO2: 19 mmol/L — ABNORMAL LOW (ref 22–32)
Calcium: 9 mg/dL (ref 8.9–10.3)
Chloride: 102 mmol/L (ref 98–111)
Creatinine, Ser: 4.07 mg/dL — ABNORMAL HIGH (ref 0.61–1.24)
GFR, Estimated: 19 mL/min — ABNORMAL LOW (ref >60)
Glucose, Bld: 120 mg/dL — ABNORMAL HIGH (ref 70–99)
Potassium: 4.4 mmol/L (ref 3.5–5.1)
Sodium: 134 mmol/L — ABNORMAL LOW (ref 135–145)

## 2022-12-21 LAB — GLUCOSE, CAPILLARY
Glucose-Capillary: 123 mg/dL — ABNORMAL HIGH (ref 70–99)
Glucose-Capillary: 143 mg/dL — ABNORMAL HIGH (ref 70–99)
Glucose-Capillary: 137 mg/dL — ABNORMAL HIGH (ref 70–99)
Glucose-Capillary: 159 mg/dL — ABNORMAL HIGH (ref 70–99)
Glucose-Capillary: 414 mg/dL — ABNORMAL HIGH (ref 70–99)

## 2022-12-21 LAB — GLUCOSE, RANDOM: Glucose, Bld: 277 mg/dL — ABNORMAL HIGH (ref 70–99)

## 2022-12-21 SURGERY — ARTERIOVENOUS (AV) FISTULA CREATION
Anesthesia: General | Laterality: Right

## 2022-12-21 MED ORDER — SODIUM CHLORIDE 0.9 % IV SOLN: INTRAVENOUS | Status: DC

## 2022-12-21 MED ORDER — HEPARIN 6000 UNIT IRRIGATION SOLUTION
Status: DC | PRN
  Administered 2022-12-21: 1

## 2022-12-21 MED ORDER — FENTANYL CITRATE (PF) 250 MCG/5ML IJ SOLN
INTRAMUSCULAR | Status: DC | PRN
  Administered 2022-12-21 (×2): 50 ug via INTRAVENOUS

## 2022-12-21 MED ORDER — ROCURONIUM BROMIDE 10 MG/ML (PF) SYRINGE
PREFILLED_SYRINGE | INTRAVENOUS | Status: DC | PRN
  Administered 2022-12-21: 60 mg via INTRAVENOUS

## 2022-12-21 MED ORDER — GLYCOPYRROLATE 0.2 MG/ML IJ SOLN
INTRAMUSCULAR | Status: DC | PRN
Start: 2022-12-21 — End: 2022-12-21
  Administered 2022-12-21: .2 mg via INTRAVENOUS

## 2022-12-21 MED ORDER — DEXAMETHASONE SODIUM PHOSPHATE 10 MG/ML IJ SOLN
INTRAMUSCULAR | Status: DC | PRN
  Administered 2022-12-21: 4 mg via INTRAVENOUS

## 2022-12-21 MED ORDER — 0.9 % SODIUM CHLORIDE (POUR BTL) OPTIME
TOPICAL | Status: DC | PRN
  Administered 2022-12-21: 1000 mL

## 2022-12-21 MED ORDER — CHLORHEXIDINE GLUCONATE 0.12 % MT SOLN: 15.0000 mL | Freq: Once | OROMUCOSAL | Status: AC

## 2022-12-21 MED ORDER — VANCOMYCIN HCL 1250 MG/250ML IV SOLN: 1250.0000 mg | INTRAVENOUS | Status: DC

## 2022-12-21 MED ORDER — ROCURONIUM BROMIDE 10 MG/ML (PF) SYRINGE
PREFILLED_SYRINGE | INTRAVENOUS | Status: AC
  Filled 2022-12-21: qty 10

## 2022-12-21 MED ORDER — PROPOFOL 10 MG/ML IV BOLUS
INTRAVENOUS | Status: AC
  Filled 2022-12-21: qty 20

## 2022-12-21 MED ORDER — PHENYLEPHRINE HCL-NACL 20-0.9 MG/250ML-% IV SOLN
INTRAVENOUS | Status: DC | PRN
  Administered 2022-12-21: 30 ug/min via INTRAVENOUS

## 2022-12-21 MED ORDER — ACETAMINOPHEN 500 MG PO TABS: 1000.0000 mg | ORAL_TABLET | Freq: Once | ORAL | Status: AC

## 2022-12-21 MED ORDER — GLYCOPYRROLATE PF 0.2 MG/ML IJ SOSY
PREFILLED_SYRINGE | INTRAMUSCULAR | Status: AC
  Filled 2022-12-21: qty 1

## 2022-12-21 MED ORDER — ACETAMINOPHEN 500 MG PO TABS
ORAL_TABLET | ORAL | Status: AC
  Administered 2022-12-21: 1000 mg via ORAL
  Filled 2022-12-21: qty 2

## 2022-12-21 MED ORDER — PROPOFOL 10 MG/ML IV BOLUS
INTRAVENOUS | Status: DC | PRN
  Administered 2022-12-21: 125 ug/kg/min via INTRAVENOUS
  Administered 2022-12-21: 200 mg via INTRAVENOUS

## 2022-12-21 MED ORDER — SUGAMMADEX SODIUM 200 MG/2ML IV SOLN
INTRAVENOUS | Status: DC | PRN
  Administered 2022-12-21: 200 mg via INTRAVENOUS

## 2022-12-21 MED ORDER — HEPARIN 6000 UNIT IRRIGATION SOLUTION
Status: AC
  Filled 2022-12-21: qty 500

## 2022-12-21 MED ORDER — DEXAMETHASONE SODIUM PHOSPHATE 10 MG/ML IJ SOLN
INTRAMUSCULAR | Status: AC
  Filled 2022-12-21: qty 1

## 2022-12-21 MED ORDER — MIDAZOLAM HCL 2 MG/2ML IJ SOLN
INTRAMUSCULAR | Status: DC | PRN
  Administered 2022-12-21: 2 mg via INTRAVENOUS

## 2022-12-21 MED ORDER — SODIUM CHLORIDE 0.9 % IV SOLN
INTRAVENOUS | Status: DC | PRN
Start: 2022-12-21 — End: 2022-12-21

## 2022-12-21 MED ORDER — CEFAZOLIN SODIUM-DEXTROSE 2-3 GM-%(50ML) IV SOLR
INTRAVENOUS | Status: DC | PRN
Start: 2022-12-21 — End: 2022-12-21
  Administered 2022-12-21: 2 g via INTRAVENOUS

## 2022-12-21 MED ORDER — MIDAZOLAM HCL 2 MG/2ML IJ SOLN
INTRAMUSCULAR | Status: AC
  Filled 2022-12-21: qty 2

## 2022-12-21 MED ORDER — ONDANSETRON HCL 4 MG/2ML IJ SOLN
INTRAMUSCULAR | Status: AC
  Filled 2022-12-21: qty 2

## 2022-12-21 MED ORDER — INSULIN ASPART 100 UNIT/ML IJ SOLN: 0.0000 [IU] | INTRAMUSCULAR | Status: DC | PRN

## 2022-12-21 MED ORDER — ONDANSETRON HCL 4 MG/2ML IJ SOLN
INTRAMUSCULAR | Status: DC | PRN
Start: 2022-12-21 — End: 2022-12-21
  Administered 2022-12-21: 4 mg via INTRAVENOUS

## 2022-12-21 MED ORDER — CHLORHEXIDINE GLUCONATE 0.12 % MT SOLN
OROMUCOSAL | Status: AC
  Administered 2022-12-21: 15 mL via OROMUCOSAL
  Filled 2022-12-21: qty 15

## 2022-12-21 MED ORDER — LIDOCAINE 2% (20 MG/ML) 5 ML SYRINGE
INTRAMUSCULAR | Status: AC
  Filled 2022-12-21: qty 5

## 2022-12-21 MED ORDER — ORAL CARE MOUTH RINSE: 15.0000 mL | Freq: Once | OROMUCOSAL | Status: AC

## 2022-12-21 MED ORDER — LIDOCAINE 2% (20 MG/ML) 5 ML SYRINGE
INTRAMUSCULAR | Status: DC | PRN
  Administered 2022-12-21: 100 mg via INTRAVENOUS

## 2022-12-21 MED ORDER — FENTANYL CITRATE (PF) 250 MCG/5ML IJ SOLN
INTRAMUSCULAR | Status: AC
  Filled 2022-12-21: qty 5

## 2022-12-21 SURGICAL SUPPLY — 40 items
ADH SKN CLS APL DERMABOND .7 (GAUZE/BANDAGES/DRESSINGS) ×4
APL PRP STRL LF DISP 70% ISPRP (MISCELLANEOUS) ×2
APL SKNCLS STERI-STRIP NONHPOA (GAUZE/BANDAGES/DRESSINGS) ×2
ARMBAND PINK RESTRICT EXTREMIT (MISCELLANEOUS) ×2 IMPLANT
BENZOIN TINCTURE PRP APPL 2/3 (GAUZE/BANDAGES/DRESSINGS) ×2 IMPLANT
CANISTER SUCT 3000ML PPV (MISCELLANEOUS) ×2 IMPLANT
CANNULA VESSEL 3MM 2 BLNT TIP (CANNULA) ×2 IMPLANT
CHLORAPREP W/TINT 26 (MISCELLANEOUS) ×2 IMPLANT
CLIP LIGATING EXTRA MED SLVR (CLIP) ×2 IMPLANT
CLIP LIGATING EXTRA SM BLUE (MISCELLANEOUS) ×2 IMPLANT
COVER PROBE W GEL 5X96 (DRAPES) IMPLANT
DERMABOND ADVANCED .7 DNX12 (GAUZE/BANDAGES/DRESSINGS) IMPLANT
DRAPE LAPAROSCOPIC ABDOMINAL (DRAPES) IMPLANT
ELECT REM PT RETURN 9FT ADLT (ELECTROSURGICAL) ×2
ELECTRODE REM PT RTRN 9FT ADLT (ELECTROSURGICAL) ×2 IMPLANT
GLOVE BIO SURGEON STRL SZ8 (GLOVE) ×2 IMPLANT
GOWN STRL REUS W/ TWL LRG LVL3 (GOWN DISPOSABLE) ×4 IMPLANT
GOWN STRL REUS W/ TWL XL LVL3 (GOWN DISPOSABLE) ×2 IMPLANT
GOWN STRL REUS W/TWL LRG LVL3 (GOWN DISPOSABLE) ×4
GOWN STRL REUS W/TWL XL LVL3 (GOWN DISPOSABLE) ×2
INSERT FOGARTY SM (MISCELLANEOUS) IMPLANT
KIT BASIN OR (CUSTOM PROCEDURE TRAY) ×2 IMPLANT
KIT TURNOVER KIT B (KITS) ×2 IMPLANT
NDL 18GX1X1/2 (RX/OR ONLY) (NEEDLE) IMPLANT
NEEDLE 18GX1X1/2 (RX/OR ONLY) (NEEDLE) IMPLANT
NS IRRIG 1000ML POUR BTL (IV SOLUTION) ×2 IMPLANT
PACK CV ACCESS (CUSTOM PROCEDURE TRAY) ×2 IMPLANT
PAD ARMBOARD 7.5X6 YLW CONV (MISCELLANEOUS) ×4 IMPLANT
SLING ARM FOAM STRAP LRG (SOFTGOODS) IMPLANT
SLING ARM FOAM STRAP MED (SOFTGOODS) IMPLANT
STRIP CLOSURE SKIN 1/2X4 (GAUZE/BANDAGES/DRESSINGS) ×2 IMPLANT
SUT MNCRL AB 4-0 PS2 18 (SUTURE) ×2 IMPLANT
SUT PROLENE 6 0 BV (SUTURE) ×2 IMPLANT
SUT VIC AB 2-0 UR6 27 (SUTURE) IMPLANT
SUT VIC AB 3-0 SH 27 (SUTURE) ×4
SUT VIC AB 3-0 SH 27X BRD (SUTURE) ×2 IMPLANT
SYR 3ML LL SCALE MARK (SYRINGE) IMPLANT
TOWEL GREEN STERILE (TOWEL DISPOSABLE) ×2 IMPLANT
UNDERPAD 30X36 HEAVY ABSORB (UNDERPADS AND DIAPERS) ×2 IMPLANT
WATER STERILE IRR 1000ML POUR (IV SOLUTION) ×2 IMPLANT

## 2022-12-21 NOTE — Consult Note (Signed)
[facility]    Date of Admission:  12/17/2022   Total days of antibiotics 4       Reason for Consult: pd associated peritonitis    Referring Provider: dahal    Assessment: Abdominal wall cellulitis with probable PD catheter associated peritonitis  Plan: Recommend to treat with 14 days of broad spectrum since considered culture negative :for now to continue on vancomycin 1750mg  renally dosed and ceftaz 1000mg  Iv daily renally dosed Once ready for discharge, can switch to oral abtx, using:  Linezolid 600mg  po BID plus levofloxacin 750mg  every other day plus metronidazole 500mg  BID to finish through 01/14/2023 3. Leukocytosis - likely elevated from surgery, anticipate to trend down on follow up cbc    Principal Problem:   Cellulitis of abdominal wall    [MAR Hold] amLODipine  5 mg Oral Daily   [MAR Hold] carvedilol  6.25 mg Oral BID WC   [MAR Hold] docusate sodium  100 mg Oral BID   [MAR Hold] ferrous sulfate  325 mg Oral Q breakfast   [MAR Hold] gabapentin  300 mg Oral BID   [MAR Hold] gentamicin cream  1 Application Topical Daily   [MAR Hold] heparin  5,000 Units Subcutaneous Q8H   [MAR Hold] insulin aspart  0-5 Units Subcutaneous QHS   [MAR Hold] insulin aspart  0-9 Units Subcutaneous TID WC   [MAR Hold] senna  1 tablet Oral BID    HPI: [name] [name] is a 37 y.o. male who recently had PD catheter placed on 9/12 by dr Lenell Antu however started to have lower abdominal pain but afebrile on 9/30, it persisted to the following morning where he also noticed erythema and induration to left side of abdomen radiating throughout his abdominal wall. He was to start doing PD sessions on 10/1. Peritoneal fluid was attempted to be pulled but unsuccessful. He was started empirically on vancomycin and ceftaz. His leukocytosis and abdominal wall cellulitis improved. He had his PD catheter removed on 10/4 in addition to right brachial artery to cephalic vein AV  fistula creation. ID asked to weigh in on management   Review of Systems:  Constitutional: Negative for fever, chills, diaphoresis, activity change, appetite change, fatigue and unexpected weight change.  HENT: Negative for congestion, sore throat, rhinorrhea, sneezing, trouble swallowing and sinus pressure.  Eyes: Negative for photophobia and visual disturbance.  Respiratory: Negative for cough, chest tightness, shortness of breath, wheezing and stridor.  Cardiovascular: Negative for chest pain, palpitations and leg swelling.  Gastrointestinal: abdominal wall pain+. Negative for nausea, vomiting,  diarrhea, constipation, blood in stool, abdominal distention and anal bleeding.  Genitourinary: Negative for dysuria, hematuria, flank pain and difficulty urinating.  Musculoskeletal: Negative for myalgias, back pain, joint swelling, arthralgias and gait problem.  Skin: Negative for color change, pallor, rash and wound.  Neurological: Negative for dizziness, tremors, weakness and light-headedness.  Hematological: Negative for adenopathy. Does not bruise/bleed easily.  Psychiatric/Behavioral: Negative for behavioral problems, confusion, sleep disturbance, dysphoric mood, decreased concentration and agitation.    Past Medical History:  Diagnosis Date   Anemia    Arthritis    "neck; maybe in my fingers" (11/06/2016)   Asthma    Bipolar disorder (HCC)    "IED: intentional deficit disorder"   Chronic kidney disease (CKD), stage III (moderate) (HCC)    Depression    Heart murmur    "when I was a child"   Hypertension    Type II diabetes  mellitus (HCC)     Social History   Tobacco Use   Smoking status: Never   Smokeless tobacco: Never  Vaping Use   Vaping status: Former   Substances: THC   Devices: THC  Substance Use Topics   Alcohol use: Yes    Comment: 11/06/2016 "I'll drink a little at parties; maybe 5 times/year"   Drug use: Yes    Types: Marijuana    Comment: 11/06/2016 "stopped  ~ 1 month ago"    Family History  Problem Relation Age of Onset   Diabetes type II Mother    Coronary artery disease Mother    Diabetes type II Father    Colon cancer Maternal Grandfather    Lung cancer Maternal Grandfather    Prostate cancer Maternal Grandfather    Diabetes Maternal Grandfather    Hypertension Maternal Grandfather    Heart disease Paternal Grandfather    Parkinson's disease Paternal Aunt    Multiple sclerosis Paternal Aunt    No Known Allergies  OBJECTIVE: Blood pressure (!) 161/95, pulse 84, temperature 98.6 F (37 C), temperature source Oral, resp. rate 16, height 5\' 8"  (1.727 m), weight 108.9 kg, SpO2 100%. Physical Exam  Constitutional: He is oriented to person, place, and time. He appears well-developed and well-nourished. No distress.  HENT:  Mouth/Throat: Oropharynx is clear and moist. No oropharyngeal exudate.  Cardiovascular: Normal rate, regular rhythm and normal heart sounds. Exam reveals no gallop and no friction rub.  No murmur heard.  Pulmonary/Chest: Effort normal and breath sounds normal. No respiratory distress. He has no wheezes.  Abdominal: Soft. Bowel sounds are normal. He exhibits mild distension. Previous pd catheter incision now glued. C/d/I.There is no tenderness.  Lymphadenopathy:  He has no cervical adenopathy.  Neurological: He is alert and oriented to person, place, and time.  Skin: Skin is warm and dry. No rash noted. No erythema.  Psychiatric: He has a normal mood and affect. His behavior is normal.    Lab Results Lab Results  Component Value Date   WBC 16.7 (H) 12/20/2022   HGB 9.0 (L) 12/20/2022   HCT 27.9 (L) 12/20/2022   MCV 90.3 12/20/2022   PLT 274 12/20/2022    Lab Results  Component Value Date   CREATININE 4.07 (H) 12/21/2022   BUN 50 (H) 12/21/2022   NA 134 (L) 12/21/2022   K 4.4 12/21/2022   CL 102 12/21/2022   CO2 19 (L) 12/21/2022    Lab Results  Component Value Date   ALT 12 12/17/2022   AST 8 (L)  12/17/2022   ALKPHOS 66 12/17/2022   BILITOT 0.6 12/17/2022     Microbiology: Recent Results (from the past 240 hour(s))  Blood culture (routine x 2)     Status: None (Preliminary result)   Collection Time: 12/18/22  6:12 AM   Specimen: BLOOD RIGHT ARM  Result Value Ref Range Status   Specimen Description BLOOD RIGHT ARM  Final   Special Requests   Final    BOTTLES DRAWN AEROBIC AND ANAEROBIC Blood Culture adequate volume   Culture   Final    NO GROWTH 3 DAYS Performed at [facility], [address] [place], [place] [address]    Report Status PENDING  Incomplete  Blood culture (routine x 2)     Status: None (Preliminary result)   Collection Time: 12/18/22  6:12 AM   Specimen: BLOOD LEFT ARM  Result Value Ref Range Status   Specimen Description BLOOD LEFT ARM  Final  Special Requests   Final    BOTTLES DRAWN AEROBIC AND ANAEROBIC Blood Culture adequate volume   Culture   Final    NO GROWTH 3 DAYS Performed at [facility], [address], [place], [place] [address]    Report Status PENDING  Incomplete  MRSA Next Gen by PCR, Nasal     Status: None   Collection Time: 12/20/22 10:31 AM   Specimen: Nasal Mucosa; Nasal Swab  Result Value Ref Range Status   MRSA by PCR Next Gen NOT DETECTED NOT DETECTED Final    Comment: (NOTE) The GeneXpert MRSA Assay (FDA approved for NASAL specimens only), is one component of a comprehensive MRSA colonization surveillance program. It is not intended to diagnose MRSA infection nor to guide or monitor treatment for MRSA infections. Test performance is not FDA approved in patients less than 60 years old. Performed at [facility], [address] [place], [place] [address]     I have personally spent 50 minutes involved in face-to-face and non-face-to-face activities for this patient on the day of the visit. Professional time spent includes the following activities: Preparing to see the patient (review of tests), Obtaining  and/or reviewing separately obtained history (admission/discharge record), Performing a medically appropriate examination and/or evaluation , Ordering medications/tests/procedures, referring and communicating with other health care professionals, Documenting clinical information in the EMR, Independently interpreting results (not separately reported), Communicating results to the patient/family/caregiver, Counseling and educating the patient/family/caregiver and Care coordination (not separately reported).    Duke Salvia Drue Second MD MPH [facility] [phone]

## 2022-12-21 NOTE — Anesthesia Procedure Notes (Addendum)
Procedure Name: Intubation Date/Time: 12/21/2022 1:34 PM  Performed by: Pincus Large, CRNAPre-anesthesia Checklist: Patient identified, Emergency Drugs available, Suction available and Patient being monitored Patient Re-evaluated:Patient Re-evaluated prior to induction Oxygen Delivery Method: Circle System Utilized Preoxygenation: Pre-oxygenation with 100% oxygen Induction Type: IV induction Ventilation: Mask ventilation without difficulty Laryngoscope Size: Mac and 3 Grade View: Grade II Tube type: Oral Tube size: 7.5 mm Number of attempts: 1 Airway Equipment and Method: Stylet and Oral airway Placement Confirmation: ETT inserted through vocal cords under direct vision, positive ETCO2 and breath sounds checked- equal and bilateral Secured at: 22 cm Tube secured with: Tape Dental Injury: Teeth and Oropharynx as per pre-operative assessment

## 2022-12-21 NOTE — Progress Notes (Signed)
PROGRESS NOTE  [name] [name]  DOB: [birth year]  PCP: Marcine Matar, MD XLK:[id number]  DOA: 12/17/2022  LOS: 2 days  Hospital Day: 5  Brief narrative: [name] [name] is a 37 y.o. male with PMH significant for ESRD on PD, DM 2, HTN, chronic anemia, depression, bipolar disorder who recently underwent a PD catheter placed by vascular surgery Dr. Lenell Antu on 9/12 Patient was brought to the ED from home by EMS last night for lower abdominal pain 8/10.  EMS gave 100 mcg of fentanyl on the way.  Patient reports onset of abdominal pain 1 to 2 days ago which had progressively worsened and generalized.  No fever, nausea vomiting, diarrhea.  In the ED, patient was afebrile, hemodynamically, breathing on room air Labs with WC count 15.3, hemoglobin 8.7, platelet 246, BUN/creatinine 65/4.28 CT abdomen pelvis showed cellulitis and a small volume fluid at PD catheter access site.  No evidence of organized abscess.  It also showed nonspecific perinephric fat stranding. Some hazy stranding about the renal pelvises also nonspecific but correlate for upper urinary tract infection. Urinalysis showed clear straw-colored urine with negative leukocytes, rare bacteria Blood culture was sent. Admitted to [facility] Nephrology was consulted.  Subjective: Patient was seen and examined this morning. Sitting up at the edge of the bed.  Not in distress. Waiting for PT catheter removal with vascular surgery today.  Assessment/Plan: Abdominal wall cellulitis  Suspected peritonitis Presented with progressive abdominal pain and cellulitis near the site of recently PD No fever but has leukocytosis but had some abdominal tenderness. Nephrology on board.  Peritoneal fluid collection was attempted but unable to collect because of a lot of fibrin in the tube.  Patient was started on empiric antibiotic coverage  Abdominal pain improving.  WBC count improving. Blood culture without growth.   Planned for PD catheter removal today.   ID consulted. For pain control, continue as needed Dilaudid Recent Labs  Lab 12/17/22 2045 12/19/22 0448 12/20/22 0457  WBC 15.3* 13.2* 16.7*   ESRD on PD Nephrology following  Had catheter placed 2 weeks ago.  Has not started using it yet.  Currently with no uremic symptoms.  Type 2 diabetes mellitus Diabetic neuropathy A1c 6.5 on 12/18/2022 PTA meds-on Jardiance MWF Currently on SSI/Accu-Cheks only. Continue gabapentin Recent Labs  Lab 12/20/22 1157 12/20/22 1628 12/20/22 2110 12/21/22 0654 12/21/22 1138  GLUCAP 217* 183* 165* 123* 143*   Hypertension PTA meds- carvedilol 25 mg twice daily, chlorthalidone 12.5 mg daily, amlodipine 10 mg daily, valsartan 40 mg daily, Jardiance 10 mg MWF.   Currently blood pressure is controlled only on low-dose of carvedilol 6.25 mg twice daily and amlodipine 5 mg daily  Continue to monitor blood pressure.  HLD Continue pravastatin  Chronic anemia Chronically has low hemoglobin due to iron deficiency and CKD Recent Labs    06/25/22 1627 11/29/22 0631 12/17/22 2045 12/17/22 2045 12/19/22 0448 12/20/22 0457  HGB 9.7* 9.2* 8.7*  --  8.7* 9.0*  MCV 90  --  92.2   < > 89.0 90.3  FERRITIN 66  --   --   --   --   --   TIBC 258  --   --   --   --   --   IRON 43  --   --   --   --   --    < > = values in this interval not displayed.   Depression, bipolar disorder Not on meds??  Mobility: Encourage ambulation  Goals of  care   Code Status: Full Code    DVT prophylaxis:  heparin injection 5,000 Units Start: 12/18/22 0830   Antimicrobials: Currently on IV ceftazidime Fluid: None Consultants: Nephrology, vascular surgery Family Communication: None at bedside  Status: Inpatient Level of care:  Med-Surg   Patient from: Home Anticipated d/c to: Hopefully home in 1 to 2 days after clearance by nephrology Needs to continue in-hospital care:  Needs IV antibiotics.  PD catheter removed today.  Scheduled Meds:  [MAR Hold]  amLODipine  5 mg Oral Daily   [MAR Hold] carvedilol  6.25 mg Oral BID WC   [MAR Hold] docusate sodium  100 mg Oral BID   [MAR Hold] ferrous sulfate  325 mg Oral Q breakfast   [MAR Hold] gabapentin  300 mg Oral BID   [MAR Hold] gentamicin cream  1 Application Topical Daily   [MAR Hold] heparin  5,000 Units Subcutaneous Q8H   [MAR Hold] insulin aspart  0-5 Units Subcutaneous QHS   [MAR Hold] insulin aspart  0-9 Units Subcutaneous TID WC   [MAR Hold] senna  1 tablet Oral BID    PRN meds: 0.9 % irrigation (POUR BTL), [MAR Hold] acetaminophen **OR** [MAR Hold] acetaminophen, [MAR Hold] albuterol, heparin, [MAR Hold] heparin sodium (porcine) 3,000 Units in dialysis solution 1.5% low-MG/low-CA 6,000 mL dialysis solution, [MAR Hold] hydrALAZINE, insulin aspart   Infusions:   sodium chloride 10 mL/hr at 12/21/22 1258   [MAR Hold] cefTAZidime (FORTAZ)  IV 1 g (12/20/22 1743)   [MAR Hold] vancomycin      Diet:  Diet Order             Diet NPO time specified  Diet effective midnight                   Antimicrobials: Anti-infectives (From admission, onward)    Start     Dose/Rate Route Frequency Ordered Stop   12/22/22 1600  [MAR Hold]  vancomycin (VANCOREADY) IVPB 1250 mg/250 mL        (MAR Hold since Fri 12/21/2022 at 1244.Hold Reason: Transfer to a Procedural area)   1,250 mg 166.7 mL/hr over 90 Minutes Intravenous Every 48 hours 12/21/22 0846     12/20/22 1500  vancomycin (VANCOREADY) IVPB 2000 mg/400 mL  Status:  Discontinued        2,000 mg 200 mL/hr over 120 Minutes Intravenous  Once 12/20/22 1401 12/20/22 1411   12/20/22 1500  vancomycin (VANCOREADY) IVPB 1750 mg/350 mL        1,750 mg 175 mL/hr over 120 Minutes Intravenous  Once 12/20/22 1412 12/20/22 1738   12/20/22 1404  vancomycin variable dose per unstable renal function (pharmacist dosing)  Status:  Discontinued         Does not apply See admin instructions 12/20/22 1404 12/21/22 0846   12/18/22 1800  [MAR Hold]   cefTAZidime (FORTAZ) 1 g in sodium chloride 0.9 % 100 mL IVPB        (MAR Hold since Fri 12/21/2022 at 1244.Hold Reason: Transfer to a Procedural area)   1 g 200 mL/hr over 30 Minutes Intravenous Every 24 hours 12/18/22 1330         Objective: Vitals:   12/21/22 0731 12/21/22 1247  BP: (!) 126/96 (!) 161/95  Pulse: 86 84  Resp: 16 16  Temp: 97.7 F (36.5 C) 98.6 F (37 C)  SpO2: 100% 100%    Intake/Output Summary (Last 24 hours) at 12/21/2022 1429 Last data filed at 12/21/2022 1417 Gross per  24 hour  Intake 590 ml  Output --  Net 590 ml   Filed Weights   12/20/22 0500 12/21/22 0731 12/21/22 1247  Weight: 111 kg 108.9 kg 108.9 kg   Weight change:  Body mass index is 36.5 kg/m.   Physical Exam: General exam: Pleasant, young male.  Not in distress. Skin: No rashes, lesions or ulcers. HEENT: Atraumatic, normocephalic, no obvious bleeding Lungs: Clear to auscultation bilaterally CVS: Regular rate and rhythm, no murmur GI/Abd small area of cellulitis on intra-abdominal wall near PD catheter access site.  Tenderness improved. CNS: Somnolent, opens eyes on verbal command.  Oriented x 3. Psychiatry: Mood appropriate Extremities: No pedal edema, no calf tenderness  Data Review: I have personally reviewed the laboratory data and studies available.  F/u labs  Unresulted Labs (From admission, onward)    None       Total time spent in review of labs and imaging, patient evaluation, formulation of plan, documentation and communication with family: 25 minutes  Signed, Lorin Glass, MD [facility] 12/21/2022

## 2022-12-21 NOTE — Progress Notes (Signed)
  Progress Note    12/21/2022 1:15 PM Day of Surgery  Subjective:  no overnight events  Vitals:   12/21/22 0731 12/21/22 1247  BP: (!) 126/96 (!) 161/95  Pulse: 86 84  Resp: 16 16  Temp: 97.7 F (36.5 C) 98.6 F (37 C)  SpO2: 100% 100%    Physical Exam: Aaox3 Erythema pd catheter, no ttp 2+ right radial pulse  CBC    Component Value Date/Time   WBC 16.7 (H) 12/20/2022 0457   RBC 3.09 (L) 12/20/2022 0457   HGB 9.0 (L) 12/20/2022 0457   HGB 9.7 (L) 06/25/2022 1627   HCT 27.9 (L) 12/20/2022 0457   HCT 28.7 (L) 06/25/2022 1627   PLT 274 12/20/2022 0457   PLT 258 06/25/2022 1627   MCV 90.3 12/20/2022 0457   MCV 90 06/25/2022 1627   MCH 29.1 12/20/2022 0457   MCHC 32.3 12/20/2022 0457   RDW 12.8 12/20/2022 0457   RDW 13.7 06/25/2022 1627   LYMPHSABS 1.3 12/20/2022 0457   LYMPHSABS 1.7 01/27/2020 1209   MONOABS 1.5 (H) 12/20/2022 0457   EOSABS 0.3 12/20/2022 0457   EOSABS 0.1 01/27/2020 1209   BASOSABS 0.1 12/20/2022 0457   BASOSABS 0.1 01/27/2020 1209    BMET    Component Value Date/Time   NA 134 (L) 12/21/2022 0619   NA 143 04/17/2021 1529   K 4.4 12/21/2022 0619   CL 102 12/21/2022 0619   CO2 19 (L) 12/21/2022 0619   GLUCOSE 120 (H) 12/21/2022 0619   BUN 50 (H) 12/21/2022 0619   BUN 37 (H) 04/17/2021 1529   CREATININE 4.07 (H) 12/21/2022 0619   CREATININE 0.73 04/10/2016 1125   CALCIUM 9.0 12/21/2022 0619   GFRNONAA 19 (L) 12/21/2022 0619   GFRNONAA >89 04/10/2016 1125   GFRAA 91 01/27/2020 1209   GFRAA >89 04/10/2016 1125    INR    Component Value Date/Time   INR 0.97 04/11/2016 0330     Intake/Output Summary (Last 24 hours) at 12/21/2022 1315 Last data filed at 12/21/2022 0817 Gross per 24 hour  Intake 240 ml  Output --  Net 240 ml     Assessment:  37 y.o. male is here with non functional pd catheter, not yet on hd  Plan: OR today for pd catheter removal and right arm avf vs avg   Rodgerick Gilliand C. Randie Heinz, MD [facility] Office: [phone] Pager: [phone]  12/21/2022 1:15 PM

## 2022-12-21 NOTE — Anesthesia Preprocedure Evaluation (Addendum)
Anesthesia Evaluation  Patient identified by MRN, date of birth, ID band Patient awake    Reviewed: Allergy & Precautions, NPO status , Patient's Chart, lab work & pertinent test results  History of Anesthesia Complications Negative for: history of anesthetic complications  Airway Mallampati: II  TM Distance: >3 FB Neck ROM: Full    Dental  (+) Dental Advisory Given, Teeth Intact   Pulmonary asthma    Pulmonary exam normal        Cardiovascular hypertension, Pt. on medications and Pt. on home beta blockers Normal cardiovascular exam   '23 Myoperfusion -   The study is normal. The study is low risk.   No ST deviation was noted.   Left ventricular function is normal. Nuclear stress EF: 56 %. The left ventricular ejection fraction is normal (55-65%). End diastolic cavity size is normal.   Prior study not available for comparison.    Neuro/Psych  PSYCHIATRIC DISORDERS  Depression Bipolar Disorder   negative neurological ROS     GI/Hepatic ,,,(+)     substance abuse  marijuana use Gastroparesis    Endo/Other  diabetes, Type 2, Oral Hypoglycemic Agents  Morbid obesity  Renal/GU ESRFRenal disease     Musculoskeletal  (+) Arthritis ,    Abdominal   Peds  Hematology  (+) Blood dyscrasia, anemia   Anesthesia Other Findings   Reproductive/Obstetrics                              Anesthesia Physical Anesthesia Plan  ASA: 4  Anesthesia Plan: General   Post-op Pain Management: Tylenol PO (pre-op)*   Induction: Intravenous  PONV Risk Score and Plan: 2 and Treatment may vary due to age or medical condition, Ondansetron, Dexamethasone and Midazolam  Airway Management Planned: Oral ETT  Additional Equipment: None  Intra-op Plan:   Post-operative Plan: Extubation in OR  Informed Consent: I have reviewed the patients History and Physical, chart, labs and discussed the procedure  including the risks, benefits and alternatives for the proposed anesthesia with the patient or authorized representative who has indicated his/her understanding and acceptance.     Dental advisory given  Plan Discussed with: CRNA and Anesthesiologist  Anesthesia Plan Comments:          Anesthesia Quick Evaluation

## 2022-12-21 NOTE — Progress Notes (Signed)
[facility] Progress Note  Subjective: seen in room, no c/o's.   Vitals:   12/20/22 1949 12/21/22 0500 12/21/22 0731 12/21/22 1247  BP: 125/75 (!) 147/85 (!) 126/96 (!) 161/95  Pulse: 76  86 84  Resp: 17 17 16 16   Temp: 98.7 F (37.1 C) 98.1 F (36.7 C) 97.7 F (36.5 C) 98.6 F (37 C)  TempSrc: Oral Oral Oral Oral  SpO2: 100% 99% 100% 100%  Weight:   108.9 kg 108.9 kg  Height:    5\' 8"  (1.727 m)    Exam: Gen alert, no distress No jvd or bruits Chest clear bilat to bases RRR no MRG Abd soft obese ntnd no mass or ascites +bs PD cath mid L abdomen +area of round erythema 3x 3 cm, a little less red Ext no LE or UE edema Neuro is alert, Ox 3 , nf       Renal-related home meds: - chlorthalidone 12.5  - valsartan 40 every day - norvasc 5 every day - coreg 6.25 bid      OP PD: does not have Rx yet as he is just starting training     Assessment/ Plan: Abd wall cellulitis/ possible peritonitis/ CKD 5 - possibly related to recent PD cath placement 2 wks ago. Had some abd pain and peritonitis also a possibility. We were unable to get fluid back Monday - after instilling 1 L it would not drain, and there was fibrin coming out in the tube. Then 10/2 we TPA'd the catheter in the afternoon but the PD catheter didn't work when attempted that evening. Concerned he may have peritonitis causing catheter thrombosis. VVS planning for PD cath removal today. Planning also for new arm access for HD when matured. Does not require dialysis at this time w/ creat 4.0 in a young person. 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 Have d/w pmd to ask ID for their help. He will not have HD at time of discharge so we don't have access for IV abx. We never got fluid to test for peritonitis. PD cath removal itself is very helpful in case of  any existing peritonitis. Appreciate ID assistance.  HTN/ BP - on 3-4 HTN meds at home. BP's are good here. Volume - no vol excess on exam.  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 Follow.       Vinson Moselle MD  [facility] 12/21/2022, 2:30 PM  Recent Labs  Lab 12/17/22 2045 12/18/22 1448 12/19/22 0448 12/20/22 0457 12/21/22 0619  HGB 8.7*  --  8.7* 9.0*  --   ALBUMIN 3.1* 2.9*  --   --   --   CALCIUM 8.9  --  8.6* 8.7* 9.0  PHOS  --  3.6  --   --   --   CREATININE 4.28*  --  3.98* 4.06* 4.07*  K 4.1  --  4.0 4.0 4.4   No results for input(s): "IRON", "TIBC", "FERRITIN" in the last 168 hours. Inpatient medications:  [MAR Hold] amLODipine  5 mg Oral Daily   [MAR Hold] carvedilol  6.25 mg Oral BID WC   [MAR Hold] docusate sodium  100 mg Oral BID   [MAR Hold] ferrous sulfate  325 mg Oral Q breakfast   [MAR Hold] gabapentin  300 mg Oral  BID   [MAR Hold] gentamicin cream  1 Application Topical Daily   [MAR Hold] heparin  5,000 Units Subcutaneous Q8H   [MAR Hold] insulin aspart  0-5 Units Subcutaneous QHS   [MAR Hold] insulin aspart  0-9 Units Subcutaneous TID WC   [MAR Hold] senna  1 tablet Oral BID    sodium chloride 10 mL/hr at 12/21/22 1258   [MAR Hold] cefTAZidime (FORTAZ)  IV 1 g (12/20/22 1743)   [MAR Hold] vancomycin     0.9 % irrigation (POUR BTL), [MAR Hold] acetaminophen **OR** [MAR Hold] acetaminophen, [MAR Hold] albuterol, heparin, [MAR Hold] heparin sodium (porcine) 3,000 Units in dialysis solution 1.5% low-MG/low-CA 6,000 mL dialysis solution, [MAR Hold] hydrALAZINE, insulin aspart

## 2022-12-21 NOTE — Progress Notes (Addendum)
Pharmacy Antibiotic Note  [name] [name] is a 37 y.o. male admitted on 12/17/2022 with abdominal pain. Recently underwent PD catheter placement on 9/12. CTAP showed cellulitis and small volume fluid at PD cath site. Pharmacy has been consulted for ceftazidime and vancomycin dosing. Planning for PD catheter removal and AVG formation 10/4. D/w nephrology, not planning for RRT during this admission - only prepping access.   Creatinine stable 4's. Vanc random collected this AM (~15h post dose) to ensure not subtherapeutic after loading dose resulted at 23. With current Scr, dosing will be q48 hours.   Plan: Begin vancomycin 1250 mg IV q48 hours (eAUC 467, Vd 0.5, Scr 4.07) Continue ceftazidime 1g IV q24 hours (CrCl borderline for dose adj to q12h) Check vancomycin levels at steady state  F/u antibiotic LOT   Height: 5\' 8"  (172.7 cm) Weight: 111 kg (244 lb 11.4 oz) IBW/kg (Calculated) : 68.4  Temp (24hrs), Avg:98.5 F (36.9 C), Min:97.7 F (36.5 C), Max:99.5 F (37.5 C)  Recent Labs  Lab 12/17/22 2045 12/19/22 0448 12/20/22 0457 12/21/22 0619  WBC 15.3* 13.2* 16.7*  --   CREATININE 4.28* 3.98* 4.06* 4.07*  VANCORANDOM  --   --   --  23    Estimated Creatinine Clearance: 30.3 mL/min (A) (by C-G formula based on SCr of 4.07 mg/dL (H)).    No Known Allergies  Antimicrobials this admission: Ceftazidime 10/1 >>  Vancomycin 10/3 >>  Microbiology results: 10/1 BCx: ngtd  Thank you for allowing pharmacy to be a part of this patient's care.  Rexford Maus, PharmD, BCPS 12/21/2022 7:32 AM

## 2022-12-21 NOTE — Plan of Care (Signed)
Problem: Education: Goal: Ability to describe self-care measures that may prevent or decrease complications (Diabetes Survival Skills Education) will improve Outcome: Progressing Pt has a history of DM2.  While in the hospital he is aware that MD ordered to have his CBG assessed ACHS.  Pt is on sliding scale insulin to cover his CBGs per MD's orders.  He is aware that wound will take longer to heal to his history of DM2.  Feet care and assessment performed q-shift.  D/T his history of DM2 pt also has a complication of CKD4.  Pt currently awaiting AV fistula placement for HD dialysis.   Problem: Fluid Volume: Goal: Ability to maintain a balanced intake and output will improve Outcome: Progressing Pt has a history of DM2.  While in the hospital he is aware that MD ordered to have his CBG assessed ACHS.  Pt is on sliding scale insulin to cover his CBGs per MD's orders.  He is aware that wound will take longer to heal to his history of DM2.  Feet care and assessment performed q-shift.  Per MD's orders pt was on renal diet with fluid restriction.  D/T his history of DM2 pt also has a complication of CKD4.  At the moment he is NPO and currently awaiting AV fistula placement for HD dialysis per MD's orders.  Pt is on strict I/O and daily weights.   Problem: Metabolic: Goal: Ability to maintain appropriate glucose levels will improve Outcome: Progressing MD ordered to have his CBG assessed ACHS.  Pt is on sliding scale insulin to cover his CBGs per MD's orders.  He is aware that wound will take longer to heal to his history of DM2.  Feet care and assessment performed q-shift.  D/T his history of DM2 pt also has a complication of CKD4.  Pt currently awaiting AV fistula placement for HD dialysis.   Problem: Nutritional: Goal: Maintenance of adequate nutrition will improve Outcome: Progressing Per MD's orders pt was on renal diet with fluid restriction.  D/T his history of DM2 pt also has a  complication of CKD4.  At the moment he is NPO and currently awaiting AV fistula placement for HD dialysis per MD's orders.  Pt is on strict I/O and daily weights.   Problem: Skin Integrity: Goal: Risk for impaired skin integrity will decrease Outcome: Progressing Skin integrity monitored and assessed q-shift. Instructed pt to turn q2 hours to prevent further skin impairment. Tubes and drains assessed for device related pressures sores. Pt is continent of both bowel and bladder.  Dressing changes performed per MD's orders.   Problem: Education: Goal: Knowledge of General Education information will improve Description: Including pain rating scale, medication(s)/side effects and non-pharmacologic comfort measures Outcome: Progressing Pt understands she was admitted into the hospital s/p PD catheter placement 2 weeks ago endorsing c/o RLQ abdominal pain, v/v and redness to PD site causing discomfort.  At the moment he is NPO and currently awaiting AV fistula placement for HD dialysis per MD's orders.   Problem: Clinical Measurements: Goal: Will remain free from infection Outcome: Progressing S/Sx of infection monitored and assessed q-shift. Pt has remained afebrile thus far.   Problem: Clinical Measurements: Goal: Respiratory complications will improve Outcome: Progressing Respiratory status monitored and assessed q-shift.  Pt is on room air with PO2 at 99-100% and respiration rate of 16-17 breaths per minute.  Pt has not endorsed c/o SOB or DOE.    Problem: Clinical Measurements: Goal: Cardiovascular complication will be avoided Outcome: Progressing  Pt's VS WNL thus far.    Problem: Activity: Goal: Risk for activity intolerance will decrease Outcome: Progressing Pt is independent of all his ADLs. He can get up OOB with a steady gait independently.     Problem: Nutrition: Goal: Adequate nutrition will be maintained Outcome: Progressing Per MD's orders pt was on renal diet with  fluid restriction.  D/T his history of DM2 pt also has a complication of CKD4.  At the moment he is NPO and currently awaiting AV fistula placement for HD dialysis per MD's orders.   Problem: Coping: Goal: Level of anxiety will decrease Outcome: Progressing  Pt has not endorsed c/o anxiety thus far.   Problem: Elimination: Goal: Will not experience complications related to bowel motility Outcome: Progressing Pt's LBM was today 12/21/2022.  Problem: Elimination: Goal: Will not experience complications related to urinary retention Outcome: Progressing Pt is continent of his bladder.  He has not endorsed c/o abdominal pain/ distention or dysuria.   Problem: Safety: Goal: Ability to remain free from injury will improve Outcome: Progressing Pt has remained free from falls thus far. Instructed pt to utilize RN call light for assistance. Hourly rounds performed. Bed in lowest position, locked with two upper side rails engaged. Belongings and call light within reach.   Problem: Skin Integrity: Goal: Risk for impaired skin integrity will decrease Outcome: Progressing Skin integrity monitored and assessed q-shift. Instructed pt to turn q2 hours to prevent further skin impairment. Tubes and drains assessed for device related pressures sores. Pt is continent of both bowel and bladder.  Dressing changes performed per MD's orders.

## 2022-12-21 NOTE — Transfer of Care (Signed)
Immediate Anesthesia Transfer of Care Note  Patient: Cordel Drewes  Procedure(s) Performed: RIGHT BRACHIOCEPHALIC ARTERIOVENOUS (AV) FISTULA CREATION (Right) LAPAROSCOPIC REMOVAL CONTINUOUS AMBULATORY PERITONEAL DIALYSIS  (CAPD) CATHETER  Patient Location: PACU  Anesthesia Type:General  Level of Consciousness: sedated and drowsy  Airway & Oxygen Therapy: Patient Spontanous Breathing and Patient connected to face mask oxygen  Post-op Assessment: Report given to RN and Post -op Vital signs reviewed and stable  Post vital signs: Reviewed and stable  Last Vitals:  Vitals Value Taken Time  BP 108/75   Temp 97.5   Pulse 63 12/21/22 1500  Resp 14   SpO2 96 % 12/21/22 1500  Vitals shown include unfiled device data.  Last Pain:  Vitals:   12/21/22 1250  TempSrc:   PainSc: 0-No pain      Patients Stated Pain Goal: 1 (12/21/22 0817)  Complications: No notable events documented.

## 2022-12-21 NOTE — Op Note (Signed)
Patient name: [name] [name] MRN: [id number] DOB: Sep 19, 1985 Sex: male  12/21/2022 Pre-operative Diagnosis: Chronic kidney disease stage V, abdominal wall cellulitis Post-operative diagnosis:  Same Surgeon:  Luanna Salk. Randie Heinz, MD Assistant: Nathanial Rancher, PA Procedure Performed: 1.  Right brachial artery to cephalic vein AV fistula creation 2.  Removal of peritoneal dialysis catheter  Indications: 37 year old male with chronic kidney disease stage V had PD catheter placement 3 weeks ago with now abdominal wall cellulitis with concern for underlying graft infection.  He is now indicated for catheter removal and placement of fistula for future permanent dialysis access but does not require tunneled catheter at this time.  In experience assistant was necessary to facilitate exposure of the brachial artery as well as cephalic vein and perform anastomosis between the 2 as well as remove the peritoneal dialysis catheter.  Findings: Brachial artery measured approximately 3.5 mm in diameter and was free of disease at the antecubitum.  Cephalic vein was easily dilated to 4 mm and a completion was a very strong thrill in the cephalic vein that was traced up the upper arm and there is a palpable radial artery pulse at the wrist and the Doppler signal in the radial artery augmented with compression of the fistula.  There was a larger basilic vein which I left in tact for future fistula creation if necessary.  The catheter was removed intact all fluid appeared serous there was no evidence of ongoing infection.   Procedure:  The patient was identified in the holding area and taken to the operating room where he was placed supine operative when general anesthesia was induced.  He was initially prepped and draped in the right upper extremity as well as the abdomen in usual fashion, antibiotics were administered and a timeout was called.  With concern for infection of the abdomen we turned our attention first to the  right upper extremity.  Ultrasound was used to identified a basilic and cephalic vein joining at the antecubital at the level of the brachial artery.  A transverse incision was created there we dissected down in both veins were identified.  The basilic was actually larger and the cephalic was tied off at the basilic vein and marked for orientation.  I then serially dilated this easily up to 4 mm flushed with heparinized saline and clamped it at this time I decided to create a cephalic vein AV fistula.  I then dissected down to the brachial artery and expose this and placed a vessel loop around this.  Brachial artery was then clamped distally and proximally opened longitudinally and flushed with heparinized saline both directions.  The cephalic vein was spatulated and sewn into side with 6-0 Prolene suture.  Prior to completion without flushing all directions.  Upon completion we freed up significant soft tissue as well as deep fascia to allow the fistula to seat nicely.  There is a very strong thrill in the fistula confirmed with Doppler and this was traced up the upper arm and there was a palpable radial artery pulse at the wrist also confirmed with Doppler and this did augment with compression of the fistula.  The wound was irrigated and closed in layers of Vicryl and Monocryl and Dermabond was placed at the skin level.  Attention was then turned to the abdomen.  The 2 previous abdominal incisions were then opened.  I initially identified the catheter the more cephalad incision and again dissecting this free and the Christmas tree actually became detached from the  distal end and the proximal end and 2 cuffs were removed with a Christmas tree.  I then identified the catheter through the incision near the umbilicus traced this back to the level of the fascia and the cuff was easily removed there.  The wounds were all then thoroughly irrigated including the tunnel tract between.  I used an ultrasound and could not  identify any further pieces and it appeared that the catheter was completely removed.  I closed the fascia with Vicryl suture with a UR 6 needle in a figure-of-eight fashion.  Hemostasis was obtained and the wounds the cephalad wound was closed with Monocryl alone and the wound overlying the fascial cuff was then closed with 3-0 Vicryl and 4-0 Monocryl at the skin.  Again Dermabond is placed at the sites and a sterile dressing was applied over the exit site.  The patient was then awakened from anesthesia having tolerated the procedure without immediate complication.  All counts were correct at completion.  EBL: 20 cc   Josslyn Ciolek C. Randie Heinz, MD [facility] [place] Office: [phone] Pager: [phone]

## 2022-12-21 NOTE — Discharge Instructions (Signed)
[facility]  Discharge Instructions  AV Fistula or Graft Surgery for Dialysis Access  Please refer to the following instructions for your post-procedure care. Your surgeon or physician assistant will discuss any changes with you.  Activity  You may drive the day following your surgery, if you are comfortable and no longer taking prescription pain medication. Resume full activity as the soreness in your incision resolves.  Bathing/Showering  You may shower after you go home. Keep your incision dry for 48 hours. Do not soak in a bathtub, hot tub, or swim until the incision heals completely. You may not shower if you have a hemodialysis catheter.  Incision Care  Clean your incision with mild soap and water after 48 hours. Pat the area dry with a clean towel. You do not need a bandage unless otherwise instructed. Do not apply any ointments or creams to your incision. You may have skin glue on your incision. Do not peel it off. It will come off on its own in about one week. Your arm may swell a bit after surgery. To reduce swelling use pillows to elevate your arm so it is above your heart. Your doctor will tell you if you need to lightly wrap your arm with an ACE bandage.  Diet  Resume your normal diet. There are not special food restrictions following this procedure. In order to heal from your surgery, it is CRITICAL to get adequate nutrition. Your body requires vitamins, minerals, and protein. Vegetables are the best source of vitamins and minerals. Vegetables also provide the perfect balance of protein. Processed food has little nutritional value, so try to avoid this.  Medications  Resume taking all of your medications. If your incision is causing pain, you may take over-the counter pain relievers such as acetaminophen (Tylenol). If you were prescribed a stronger pain medication, please be aware these medications can cause nausea and constipation. Prevent  nausea by taking the medication with a snack or meal. Avoid constipation by drinking plenty of fluids and eating foods with high amount of fiber, such as fruits, vegetables, and grains.  Do not take Tylenol if you are taking prescription pain medications.  Follow up Your surgeon may want to see you in the office following your access surgery. If so, this will be arranged at the time of your surgery.  Please call us immediately for any of the following conditions:  Increased pain, redness, drainage (pus) from your incision site Fever of 101 degrees or higher Severe or worsening pain at your incision site Hand pain or numbness.  Reduce your risk of vascular disease:  Stop smoking. If you would like help, call [facility] at 1-800-QUIT-NOW ((205)082-8703) or [facility] at 928-065-5297  Manage your cholesterol Maintain a desired weight Control your diabetes Keep your blood pressure down  Dialysis  It will take several weeks to several months for your new dialysis access to be ready for use. Your surgeon will determine when it is okay to use it. Your nephrologist will continue to direct your dialysis. You can continue to use your Permcath until your new access is ready for use.   12/21/2022 [name] [name] [id number] Sep 30, 1985  Surgeon(s): Maeola Harman, MD  Procedure(s): RIGHT BRACHIOCEPHALIC ARTERIOVENOUS (AV) FISTULA CREATION LAPAROSCOPIC REMOVAL CONTINUOUS AMBULATORY PERITONEAL DIALYSIS  (CAPD) CATHETER   May stick graft immediately   May stick graft on designated area only:   X Do not stick right AV fistula for 12 weeks    If  you have any questions, please call the office at [phone].

## 2022-12-22 ENCOUNTER — Other Ambulatory Visit (HOSPITAL_COMMUNITY): Payer: Self-pay

## 2022-12-22 DIAGNOSIS — L03311 Cellulitis of abdominal wall: Secondary | ICD-10-CM | POA: Diagnosis not present

## 2022-12-22 LAB — GLUCOSE, CAPILLARY
Glucose-Capillary: 175 mg/dL — ABNORMAL HIGH (ref 70–99)
Glucose-Capillary: 226 mg/dL — ABNORMAL HIGH (ref 70–99)

## 2022-12-22 MED ORDER — LINEZOLID 600 MG PO TABS
600.0000 mg | ORAL_TABLET | Freq: Two times a day (BID) | ORAL | 0 refills | Status: AC
  Filled 2022-12-22: qty 46, 23d supply, fill #0

## 2022-12-22 MED ORDER — METRONIDAZOLE 500 MG PO TABS
500.0000 mg | ORAL_TABLET | Freq: Two times a day (BID) | ORAL | 0 refills | Status: AC
  Filled 2022-12-22: qty 46, 23d supply, fill #0

## 2022-12-22 MED ORDER — LEVOFLOXACIN 750 MG PO TABS
750.0000 mg | ORAL_TABLET | ORAL | 0 refills | Status: AC
  Filled 2022-12-22: qty 7, 14d supply, fill #0

## 2022-12-22 MED ORDER — SACCHAROMYCES BOULARDII 250 MG PO CAPS
250.0000 mg | ORAL_CAPSULE | Freq: Two times a day (BID) | ORAL | 0 refills | Status: AC
  Filled 2022-12-22: qty 50, 25d supply, fill #0

## 2022-12-22 NOTE — Progress Notes (Signed)
[facility] [place] PROGRESS NOTE  ASSESSMENT / PLAN: [name] [name] is a 37 y.o. male status post right arm AV fistula creation and PD catheter removal 12/21/2022.  Overall he looks and feels well.  The fistula has a thrill that is palpable.  He is safe for discharge from my standpoint.  He should follow-up with [place] in 6 weeks with an AV fistula duplex.  SUBJECTIVE: No complaints.  His belly pain is resolved.  We reviewed operative findings.  OBJECTIVE: BP (!) 165/100 (BP Location: Left Arm)   Pulse 90   Temp 97.9 F (36.6 C) (Oral)   Resp 18   Ht 5\' 8"  (1.727 m)   Wt 111.4 kg   SpO2 100%   BMI 37.34 kg/m   Intake/Output Summary (Last 24 hours) at 12/22/2022 1134 Last data filed at 12/21/2022 1758 Gross per 24 hour  Intake 1100 ml  Output 15 ml  Net 1085 ml    No distress Regular rate and rhythm Unlabored breathing Right arm AV fistula with palpable thrill Abdominal dressings clean dry and intact Abdomen soft, nontender, nondistended     Latest Ref Rng & Units 12/20/2022    4:57 AM 12/19/2022    4:48 AM 12/17/2022    8:45 PM  CBC  WBC 4.0 - 10.5 K/uL 16.7  13.2  15.3   Hemoglobin 13.0 - 17.0 g/dL 9.0  8.7  8.7   Hematocrit 39.0 - 52.0 % 27.9  25.1  27.3   Platelets 150 - 400 K/uL 274  278  246         Latest Ref Rng & Units 12/21/2022   10:26 PM 12/21/2022    6:19 AM 12/20/2022    4:57 AM  CMP  Glucose 70 - 99 mg/dL 630  160  109   BUN 6 - 20 mg/dL  50  52   Creatinine 3.23 - 1.24 mg/dL  5.57  3.22   Sodium 025 - 145 mmol/L  134  133   Potassium 3.5 - 5.1 mmol/L  4.4  4.0   Chloride 98 - 111 mmol/L  102  105   CO2 22 - 32 mmol/L  19  19   Calcium 8.9 - 10.3 mg/dL  9.0  8.7     Estimated Creatinine Clearance: 30.4 mL/min (A) (by C-G formula based on SCr of 4.07 mg/dL (H)).  Rande Brunt. Lenell Antu, MD [facility] Phone Number: [phone] 12/22/2022 11:34 AM

## 2022-12-22 NOTE — Discharge Summary (Signed)
Physician Discharge Summary  [name] [name] ZOX:[id number] DOB: Apr 16, 1985 DOA: 12/17/2022  PCP: Marcine Matar, MD  Admit date: 12/17/2022 Discharge date: 12/22/2022  Admitted From: Home Discharge disposition: Home  Recommendations at discharge:  You been started on 3 different antibiotics with probiotics to complete on 01/14/2023 Follow-up with nephrology as an outpatient.  Brief narrative: [name] [name] is a 37 y.o. male with PMH significant for ESRD on PD, DM 2, HTN, chronic anemia, depression, bipolar disorder who recently underwent a PD catheter placed by vascular surgery Dr. Lenell Antu on 9/12 Patient was brought to the ED from home by EMS last night for lower abdominal pain 8/10.  EMS gave 100 mcg of fentanyl on the way.  Patient reports onset of abdominal pain 1 to 2 days ago which had progressively worsened and generalized.  No fever, nausea vomiting, diarrhea.  In the ED, patient was afebrile, hemodynamically, breathing on room air Labs with WC count 15.3, hemoglobin 8.7, platelet 246, BUN/creatinine 65/4.28 CT abdomen pelvis showed cellulitis and a small volume fluid at PD catheter access site.  No evidence of organized abscess.  It also showed nonspecific perinephric fat stranding. Some hazy stranding about the renal pelvises also nonspecific but correlate for upper urinary tract infection. Urinalysis showed clear straw-colored urine with negative leukocytes, rare bacteria Blood culture was sent. Admitted to [facility] Nephrology was consulted.  Subjective: Patient was seen and examined this morning. Sitting up at the edge of the bed.  Not in distress.  Abdominal pain better after PD catheter was removed yesterday. Discussed with ID, nephrology.  Ready for discharge today.  Hospital course: Abdominal wall cellulitis  Suspected peritonitis Presented with progressive abdominal pain and cellulitis near the site of recently PD No fever but has leukocytosis but had some abdominal  tenderness. Nephrology on board.  Peritoneal fluid collection was attempted but unable to collect because of a lot of fibrin in the tube.  Patient was started on empiric antibiotic coverage with vancomycin and ceftazidime. PD catheter was removed with vascular surgery on 10/4. Abdominal pain much better..  WBC count improving. Blood culture without growth.   Per ID recommendation, will discharge the patient on Linezolid 600mg  po BID plus levofloxacin 750mg  every other day plus metronidazole 500mg  BID to finish through 01/14/2023.  ESRD on PD Nephrology following  Had catheter placed 2 weeks ago.  Has not started using it yet.  Currently with no uremic symptoms. Had AV fistula created by vascular surgery on 10/4.  Type 2 diabetes mellitus Diabetic neuropathy A1c 6.5 on 12/18/2022 PTA meds-on Jardiance MWF Continue same. Continue gabapentin  Hypertension PTA meds- carvedilol 25 mg twice daily, chlorthalidone 12.5 mg daily, amlodipine 10 mg daily, valsartan 40 mg daily, Jardiance 10 mg MWF.   Continue same post discharge.  HLD Continue pravastatin  Chronic anemia Chronically has low hemoglobin due to iron deficiency and CKD Recent Labs    06/25/22 1627 11/29/22 0631 12/17/22 2045 12/17/22 2045 12/19/22 0448 12/20/22 0457  HGB 9.7* 9.2* 8.7*  --  8.7* 9.0*  MCV 90  --  92.2   < > 89.0 90.3  FERRITIN 66  --   --   --   --   --   TIBC 258  --   --   --   --   --   IRON 43  --   --   --   --   --    < > = values in this interval not displayed.   Depression,  bipolar disorder Not on meds??  Mobility: Encourage ambulation  Goals of care   Code Status: Full Code   Wounds:  - Wound / Incision (Open or Dehisced) 02/23/16 Non-pressure wound Sacrum Medial s/p I&D (Active)  Date First Assessed/Time First Assessed: 02/23/16 0520   Wound Type: Non-pressure wound  Location: Sacrum  Location Orientation: Medial  Wound Description (Comments): s/p I&D  Present on Admission: Yes     Assessments 02/23/2016  5:20 AM 02/29/2016 12:11 PM  Dressing Type Foam Gauze (Comment);Moist to dry;Abdominal pads  Dressing Changed New Changed  Dressing Status Clean;Dry;Intact Clean;Dry;Intact  Dressing Change Frequency -- Daily  Site / Wound Assessment Black;Red;Yellow;Painful Yellow;Pink  % Wound base Red or Granulating -- 45%  % Wound base Yellow/Fibrinous Exudate -- 50%  % Wound base Black/Eschar -- 5%  % Wound base Other/Granulation Tissue (Comment) -- 0%  Peri-wound Assessment Erythema (blanchable);Pink --  Wound Length (cm) 2.5 cm --  Wound Width (cm) 1 cm --  Margins -- Unattached edges (unapproximated)  Closure -- None  Drainage Amount Minimal Moderate  Drainage Description Purulent Purulent;No odor  Treatment -- Debridement (Selective);Hydrotherapy (Pulse lavage);Packing (Saline gauze)     No associated orders.     Wound / Incision (Open or Dehisced) 12/05/19 Other (Comment) Leg Left;Lower abcess/cellutiltis to left lower leg-present upon admission (Active)  Date First Assessed/Time First Assessed: 12/05/19 2000   Wound Type: Other (Comment)  Location: Leg  Location Orientation: Left;Lower  Wound Description (Comments): abcess/cellutiltis to left lower leg-present upon admission  Present on Admission: Yes    Assessments 12/05/2019  8:00 PM 12/07/2019  9:03 AM  Dressing Type Gauze (Comment) Gauze (Comment)  Dressing Status Clean;Dry;Intact Old drainage  Dressing Change Frequency -- Daily     No associated orders.     Wound / Incision (Open or Dehisced) 03/17/20 Diabetic ulcer Foot Right;Lateral (Active)  Date First Assessed/Time First Assessed: 03/17/20 1600   Wound Type: Diabetic ulcer  Location: Foot  Location Orientation: Right;Lateral  Present on Admission: Yes    Assessments 03/17/2020  3:54 PM 03/18/2020  9:31 AM  Dressing Type Gauze (Comment);Non adherent Gauze (Comment)  Dressing Changed New Changed  Dressing Status Clean;Dry;Intact Clean;Dry;Intact  Dressing  Change Frequency Twice a day Twice a day  Site / Wound Assessment Dry;Pink;Red Clean;Dry;Red  Peri-wound Assessment Intact Intact  Drainage Amount Scant None  Drainage Description Serosanguineous --  Treatment Cleansed Cleansed;Other (Comment)     No associated orders.     Incision - 4 Ports Abdomen 1: Upper;Right;Anterior 2: Left;Upper;Anterior 3: Right;Mid;Anterior 4: Anterior;Umbilicus (Active)  Placement Date/Time: 11/29/22 0840   Location of Ports: Abdomen  Port: 1:  Location Orientation: Upper;Right;Anterior  Port: 2:  Location Orientation: Left;Upper;Anterior  Port: 3:  Location Orientation: Right;Mid;Anterior  Port: 4:  Economist...    Assessments 11/29/2022  8:53 AM 12/22/2022  7:46 AM  Port 1 Site Assessment Clean;Dry Clean;Dry;Pink  Port 1 Margins -- Attached edges (approximated)  Port 1 Drainage Amount -- None  Port 1 Drainage Description -- Odor  Port 1 Dressing Type Liquid skin adhesive Liquid skin adhesive  Port 1 Dressing Status Clean, Dry, Intact Clean, Dry, Intact  Port 2 Site Assessment Clean;Dry Clean;Dry;Pink  Port 2 Margins -- Attached edges (approximated)  Port 2 Drainage Amount -- None  Port 2 Drainage Description -- Odor  Port 2 Dressing Type Liquid skin adhesive Liquid skin adhesive  Port 2 Dressing Status Clean, Dry, Intact Clean, Dry, Intact  Port 3 Site Assessment Clean;Dry Clean;Dry;Pink  Port 3 Margins --  Attached edges (approximated)  Port 3 Drainage Amount -- None  Port 3 Drainage Description -- No odor  Port 3 Dressing Type Liquid skin adhesive Liquid skin adhesive  Port 3 Dressing Status Clean, Dry, Intact Clean, Dry, Intact  Port 4 Site Assessment Clean;Dry Clean;Dry;Pink  Port 4 Margins -- Attached edges (approximated)  Port 4 Drainage Amount -- None  Port 4 Drainage Description -- No odor  Port 4 Dressing Type Liquid skin adhesive Liquid skin adhesive  Port 4 Dressing Status Clean, Dry, Intact Clean, Dry, Intact     No associated  orders.     Incision (Closed) 12/21/22 Arm Right (Active)  Date First Assessed/Time First Assessed: 12/21/22 1412   Location: Arm  Location Orientation: Right    Assessments 12/21/2022  3:00 PM 12/22/2022  7:46 AM  Dressing Type Liquid skin adhesive Liquid skin adhesive  Dressing Clean, Dry, Intact Clean, Dry, Intact  Site / Wound Assessment Dressing in place / Unable to assess Dressing in place / Unable to assess  Margins Attached edges (approximated) Attached edges (approximated)  Closure Skin glue Skin glue  Drainage Amount None None     No associated orders.     Incision (Closed) 12/21/22 Abdomen Other (Comment) (Active)  Date First Assessed/Time First Assessed: 12/21/22 1434   Location: Abdomen  Location Orientation: Other (Comment)    Assessments 12/21/2022  3:00 PM 12/22/2022  7:46 AM  Dressing Type Liquid skin adhesive Liquid skin adhesive  Dressing Clean, Dry, Intact Clean, Dry, Intact  Site / Wound Assessment Clean;Dry Clean;Dry;Pink  Margins Attached edges (approximated) Attached edges (approximated)  Closure Skin glue Skin glue  Drainage Amount None None     No associated orders.    Discharge Exam:   Vitals:   12/21/22 2048 12/22/22 0500 12/22/22 0603 12/22/22 0700  BP: (!) 146/94  (!) 150/81 (!) 165/100  Pulse: 83  85 90  Resp: 18  18 18   Temp: 98.2 F (36.8 C)  97.8 F (36.6 C) 97.9 F (36.6 C)  TempSrc: Oral  Oral Oral  SpO2: 100%  100% 100%  Weight:  111.4 kg    Height:        Body mass index is 37.34 kg/m.   General exam: Pleasant, young male.  Not in distress. Skin: No rashes, lesions or ulcers. HEENT: Atraumatic, normocephalic, no obvious bleeding Lungs: Clear to auscultation bilaterally CVS: Regular rate and rhythm, no murmur GI/Abd: Improving small area of cellulitis on intra-abdominal wall near PD catheter access site.  PD catheter removed yesterday.  Abdominal tenderness improved. CNS: Alert, awake, oriented x 3 Psychiatry: Mood  appropriate Extremities: No pedal edema, no calf tenderness  Follow ups:    Follow-up Information     Marcine Matar, MD Follow up.   Specialty: Internal Medicine Why: Please call the office and schedule a hospital follow up in the next 7-10 days. Contact information: [address] [employer] [place] [address] [phone]         [facility] Follow up.   Why: 4-6 weeks.The office will call the patient with an appointment (sent) Contact information: [address] [place] [address] [phone]        Marcine Matar, MD Follow up.   Specialty: Internal Medicine Contact information: [address] [place] [address] [place] [address] [phone]                 Discharge Instructions:   Discharge Instructions     Call MD  for:  difficulty breathing, headache or visual disturbances   Complete by: As directed    Call MD for:  extreme fatigue   Complete by: As directed    Call MD for:  hives   Complete by: As directed    Call MD for:  persistant dizziness or light-headedness   Complete by: As directed    Call MD for:  persistant nausea and vomiting   Complete by: As directed    Call MD for:  severe uncontrolled pain   Complete by: As directed    Call MD for:  temperature >100.4   Complete by: As directed    Diet - low sodium heart healthy   Complete by: As directed    Diet Carb Modified   Complete by: As directed    Discharge instructions   Complete by: As directed    Recommendations at discharge:   You been started on 3 different antibiotics with probiotics to complete on 01/14/2023  Follow-up with nephrology as an outpatient.  General discharge instructions: Follow with Primary MD Marcine Matar, MD in 7 days  Please request your PCP  to go over your hospital tests, procedures, radiology results at the follow up. Please get your medicines reviewed and adjusted.  Your PCP may decide to repeat  certain labs or tests as needed. Do not drive, operate heavy machinery, perform activities at heights, swimming or participation in water activities or provide baby sitting services if your were admitted for syncope or siezures until you have seen by Primary MD or a Neurologist and advised to do so again. [place] Controlled Substance Reporting System database was reviewed. Do not drive, operate heavy machinery, perform activities at heights, swim, participate in water activities or provide baby-sitting services while on medications for pain, sleep and mood until your outpatient physician has reevaluated you and advised to do so again.  You are strongly recommended to comply with the dose, frequency and duration of prescribed medications. Activity: As tolerated with Full fall precautions use walker/cane & assistance as needed Avoid using any recreational substances like cigarette, tobacco, alcohol, or non-prescribed drug. If you experience worsening of your admission symptoms, develop shortness of breath, life threatening emergency, suicidal or homicidal thoughts you must seek medical attention immediately by calling 911 or calling your MD immediately  if symptoms less severe. You must read complete instructions/literature along with all the possible adverse reactions/side effects for all the medicines you take and that have been prescribed to you. Take any new medicine only after you have completely understood and accepted all the possible adverse reactions/side effects.  Wear Seat belts while driving. You were cared for by a hospitalist during your hospital stay. If you have any questions about your discharge medications or the care you received while you were in the hospital after you are discharged, you can call the unit and ask to speak with the hospitalist or the covering physician. Once you are discharged, your primary care physician will handle any further medical issues. Please note that NO  REFILLS for any discharge medications will be authorized once you are discharged, as it is imperative that you return to your primary care physician (or establish a relationship with a primary care physician if you do not have one).   Discharge wound care:   Complete by: As directed    Increase activity slowly   Complete by: As directed        Discharge Medications:   Allergies as of 12/22/2022  No Known Allergies      Medication List     STOP taking these medications    oxyCODONE-acetaminophen 5-325 MG tablet Commonly known as: Percocet       TAKE these medications    acetaminophen 500 MG tablet Commonly known as: TYLENOL Take 1,000 mg by mouth every 6 (six) hours as needed for moderate pain.   amLODipine 10 MG tablet Commonly known as: NORVASC TAKE ONE TABLET BY MOUTH ONCE DAILY (AM)   carvedilol 25 MG tablet Commonly known as: COREG TAKE 1 TABLET BY MOUTH 2 (TWO) TIMES DAILY WITH A MEAL (AM+PM)   chlorthalidone 25 MG tablet Commonly known as: HYGROTON Take 12.5 mg by mouth daily.   Dexcom G6 Sensor Misc USE DAILY AS DIRECTED. ONE SENSOR LAST FOR 10 DAYS.   Dexcom G6 Transmitter Misc USE AS DIRECTED DAILY   famotidine 20 MG tablet Commonly known as: PEPCID TAKE 1 TABLET (20 MG TOTAL) BY MOUTH AT BEDTIME.   ferrous sulfate 325 (65 FE) MG tablet Take 1 tablet (325 mg total) by mouth daily with breakfast.   gabapentin 300 MG capsule Commonly known as: NEURONTIN TAKE 1 CAPSULE (300 MG TOTAL) BY MOUTH 2 (TWO) TIMES DAILY. (AM+BEDTIME) What changed: See the new instructions.   Jardiance 10 MG Tabs tablet Generic drug: empagliflozin TAKE ONE TABLET BY MOUTH ON MONDAY,WEDNESDAY AND FRIDAY (AM). What changed: See the new instructions.   levofloxacin 750 MG tablet Commonly known as: Levaquin Take 1 tablet (750 mg total) by mouth every other day for 23 days.   linezolid 600 MG tablet Commonly known as: ZYVOX Take 1 tablet (600 mg total) by mouth 2  (two) times daily for 23 days.   metroNIDAZOLE 500 MG tablet Commonly known as: Flagyl Take 1 tablet (500 mg total) by mouth 2 (two) times daily for 23 days.   pravastatin 20 MG tablet Commonly known as: PRAVACHOL TAKE 1 TABLET (20 MG TOTAL) BY MOUTH DAILY. (BEDTIME)   saccharomyces boulardii 250 MG capsule Commonly known as: FLORASTOR Take 1 capsule (250 mg total) by mouth 2 (two) times daily for 23 days.   valsartan 40 MG tablet Commonly known as: DIOVAN TAKE 1 TABLET (40 MG TOTAL) BY MOUTH DAILY. (AM)               Discharge Care Instructions  (From admission, onward)           Start     Ordered   12/22/22 0000  Discharge wound care:        12/22/22 1013             The results of significant diagnostics from this hospitalization (including imaging, microbiology, ancillary and laboratory) are listed below for reference.    Procedures and Diagnostic Studies:   CT ABDOMEN PELVIS WO CONTRAST  Result Date: 12/17/2022 CLINICAL DATA:  Right lower quadrant pain recent peritoneal dialysis catheter placement EXAM: CT ABDOMEN AND PELVIS WITHOUT CONTRAST TECHNIQUE: Multidetector CT imaging of the abdomen and pelvis was performed following the [name] protocol without IV contrast. RADIATION DOSE REDUCTION: This exam was performed according to the departmental dose-optimization program which includes automated exposure control, adjustment of the mA and/or kV according to patient size and/or use of iterative reconstruction technique. COMPARISON:  CT 04/11/2016, 02/16/2016 FINDINGS: Lower chest: Lung bases demonstrate no acute airspace disease. Borderline cardiomegaly. Hepatobiliary: No focal liver abnormality is seen. No gallstones, gallbladder wall thickening, or biliary dilatation. Pancreas: Unremarkable. No pancreatic ductal dilatation or surrounding inflammatory changes. Spleen: Normal  in size without focal abnormality. Adrenals/Urinary Tract: Adrenal glands are within  normal limits. Nonspecific bilateral perinephric fat stranding. Mild fat stranding at both renal pelvises. No hydronephrosis. No ureteral stone. The bladder is normal Stomach/Bowel: The stomach is nonenlarged. No dilated small bowel. No acute bowel wall thickening. Nonvisualized appendix Vascular/Lymphatic: Mild aortic atherosclerosis. No aneurysm. No suspicious lymph nodes Reproductive: Prostate is unremarkable. Other: No free air. Small fat containing right inguinal hernia. Peritoneal dialysis catheter at the left upper quadrant with catheter tunneled in the subcutaneous fat. Soft tissue stranding and edema surrounding the catheter with more pronounced stranding and edema surrounding the anterior to posterior segment of the catheter. Soft tissue stranding about the catheter in the peritoneal cavity, this is coiled in the right lower quadrant. No focal fluid collections. No organized abscess. Musculoskeletal: No acute osseous abnormality IMPRESSION: 1. Skin thickening and considerable subcutaneous edema within the anterior abdominal wall. Left abdominal peritoneal dialysis catheter with prominent soft tissue stranding and small volume fluid around the catheter within the subcutaneous fat of the abdominal wall as well as surrounding the catheter as it crosses the rectus sheath and is coiled in the right lower quadrant. Findings could be related to recent surgical status but would correlate for signs or symptoms of cellulitis and or infection. There is no evidence for organized abscess within the subcutaneous soft tissues or within the peritoneal cavity. 2. Nonspecific perinephric fat stranding. Some hazy stranding about the renal pelvises also nonspecific but correlate for upper urinary tract infection. Electronically Signed   By: Jasmine Pang M.D.   On: 12/17/2022 23:38     Labs:   Basic Metabolic Panel: Recent Labs  Lab 12/17/22 2045 12/18/22 1448 12/19/22 0448 12/20/22 0457 12/21/22 0619  12/21/22 2226  NA 138  --  135 133* 134*  --   K 4.1  --  4.0 4.0 4.4  --   CL 106  --  106 105 102  --   CO2 19*  --  19* 19* 19*  --   GLUCOSE 107*  --  103* 139* 120* 277*  BUN 65*  --  56* 52* 50*  --   CREATININE 4.28*  --  3.98* 4.06* 4.07*  --   CALCIUM 8.9  --  8.6* 8.7* 9.0  --   PHOS  --  3.6  --   --   --   --    GFR Estimated Creatinine Clearance: 30.4 mL/min (A) (by C-G formula based on SCr of 4.07 mg/dL (H)). Liver Function Tests: Recent Labs  Lab 12/17/22 2045 12/18/22 1448  AST 8*  --   ALT 12  --   ALKPHOS 66  --   BILITOT 0.6  --   PROT 6.9  --   ALBUMIN 3.1* 2.9*   Recent Labs  Lab 12/17/22 2045  LIPASE 33   No results for input(s): "AMMONIA" in the last 168 hours. Coagulation profile No results for input(s): "INR", "PROTIME" in the last 168 hours.  CBC: Recent Labs  Lab 12/17/22 2045 12/19/22 0448 12/20/22 0457  WBC 15.3* 13.2* 16.7*  NEUTROABS 11.9*  --  13.5*  HGB 8.7* 8.7* 9.0*  HCT 27.3* 25.1* 27.9*  MCV 92.2 89.0 90.3  PLT 246 278 274   Cardiac Enzymes: No results for input(s): "CKTOTAL", "CKMB", "CKMBINDEX", "TROPONINI" in the last 168 hours. BNP: Invalid input(s): "POCBNP" CBG: Recent Labs  Lab 12/21/22 1505 12/21/22 1710 12/21/22 2049 12/22/22 0026 12/22/22 0746  GLUCAP 137* 159* 414* 226* 175*  D-Dimer No results for input(s): "DDIMER" in the last 72 hours. Hgb A1c No results for input(s): "HGBA1C" in the last 72 hours. Lipid Profile No results for input(s): "CHOL", "HDL", "LDLCALC", "TRIG", "CHOLHDL", "LDLDIRECT" in the last 72 hours. Thyroid function studies No results for input(s): "TSH", "T4TOTAL", "T3FREE", "THYROIDAB" in the last 72 hours.  Invalid input(s): "FREET3" Anemia work up No results for input(s): "VITAMINB12", "FOLATE", "FERRITIN", "TIBC", "IRON", "RETICCTPCT" in the last 72 hours. Microbiology Recent Results (from the past 240 hour(s))  Blood culture (routine x 2)     Status: None (Preliminary  result)   Collection Time: 12/18/22  6:12 AM   Specimen: BLOOD RIGHT ARM  Result Value Ref Range Status   Specimen Description BLOOD RIGHT ARM  Final   Special Requests   Final    BOTTLES DRAWN AEROBIC AND ANAEROBIC Blood Culture adequate volume   Culture   Final    NO GROWTH 4 DAYS Performed at [facility], [address], [place], [place] [address]    Report Status PENDING  Incomplete  Blood culture (routine x 2)     Status: None (Preliminary result)   Collection Time: 12/18/22  6:12 AM   Specimen: BLOOD LEFT ARM  Result Value Ref Range Status   Specimen Description BLOOD LEFT ARM  Final   Special Requests   Final    BOTTLES DRAWN AEROBIC AND ANAEROBIC Blood Culture adequate volume   Culture   Final    NO GROWTH 4 DAYS Performed at [facility], [address] [place], [place] [address]    Report Status PENDING  Incomplete  MRSA Next Gen by PCR, Nasal     Status: None   Collection Time: 12/20/22 10:31 AM   Specimen: Nasal Mucosa; Nasal Swab  Result Value Ref Range Status   MRSA by PCR Next Gen NOT DETECTED NOT DETECTED Final    Comment: (NOTE) The GeneXpert MRSA Assay (FDA approved for NASAL specimens only), is one component of a comprehensive MRSA colonization surveillance program. It is not intended to diagnose MRSA infection nor to guide or monitor treatment for MRSA infections. Test performance is not FDA approved in patients less than 87 years old. Performed at [facility], [address], [place], [place] [address]     Time coordinating discharge: 45 minutes  Signed: Collier Monica  [facility] 12/22/2022, 10:14 AM

## 2022-12-22 NOTE — Plan of Care (Signed)
Problem: Education: Goal: Ability to describe self-care measures that may prevent or decrease complications (Diabetes Survival Skills Education) will improve Outcome: Progressing Pt has a history of DM2.  While in the hospital he is aware that MD ordered to have his CBG assessed ACHS.  Pt is on sliding scale insulin to cover his CBGs per MD's orders.  He is aware that wound will take longer to heal to his history of DM2.  Feet care and assessment performed q-shift.  He currently is awaiting d/c charge.     Problem: Fluid Volume: Goal: Ability to maintain a balanced intake and output will improve Outcome: Progressing Pt has a history of DM2.  While in the hospital he is aware that MD ordered to have his CBG assessed ACHS.  Pt is on sliding scale insulin to cover his CBGs per MD's orders.  He is aware that wound will take longer to heal to his history of DM2.  Feet care and assessment performed q-shift.  Per MD's orders pt was on renal/ carb modified diet with fluid restriction.  D/T his history of DM2 pt also has a complication of CKD4.  Pt is on strict I/O and daily weights.    Problem: Metabolic: Goal: Ability to maintain appropriate glucose levels will improve Outcome: Progressing MD ordered to have his CBG assessed ACHS.  Pt is on sliding scale insulin to cover his CBGs per MD's orders.  He is aware that wound will take longer to heal to his history of DM2.  Feet care and assessment performed q-shift.  D/T his history of DM2 pt also has a complication of CKD4.     Problem: Nutritional: Goal: Maintenance of adequate nutrition will improve Outcome: Progressing Per MD's orders pt was on renal diet with fluid restriction.  D/T his history of DM2 pt also has a complication of CKD4.  Pt is on strict I/O and daily weights.    Problem: Skin Integrity: Goal: Risk for impaired skin integrity will decrease Outcome: Progressing Skin integrity monitored and assessed q-shift. Instructed pt  to turn q2 hours to prevent further skin impairment. Tubes and drains assessed for device related pressures sores. Pt is continent of both bowel and bladder.  Dressing changes performed per MD's orders.    Problem: Education: Goal: Knowledge of General Education information will improve Description: Including pain rating scale, medication(s)/side effects and non-pharmacologic comfort measures Outcome: Progressing Pt understands she was admitted into the hospital s/p PD catheter placement 2 weeks ago endorsing c/o RLQ abdominal pain, n/v and redness to PD site causing discomfort.    Problem: Clinical Measurements: Goal: Will remain free from infection Outcome: Progressing S/Sx of infection monitored and assessed q-shift. Pt has remained afebrile thus far.    Problem: Clinical Measurements: Goal: Respiratory complications will improve Outcome: Progressing Respiratory status monitored and assessed q-shift.  Pt is on room air with PO2 at 100% and respiration rate of 18 breaths per minute.  Pt has not endorsed c/o SOB or DOE.     Problem: Clinical Measurements: Goal: Cardiovascular complication will be avoided Outcome: Progressing Pt's was hypertensive this AM.  Per MD's orders he is on antihypertensives.    Problem: Activity: Goal: Risk for activity intolerance will decrease Outcome: Progressing Pt is independent of all his ADLs. He can get up OOB with a steady gait independently.      Problem: Nutrition: Goal: Adequate nutrition will be maintained Outcome: Progressing Per MD's orders pt was on renal diet with fluid restriction.  He has not endorsed c/o abdominal pain/ distention or n/v.    Problem: Coping: Goal: Level of anxiety will decrease Outcome: Progressing  Pt has not endorsed c/o anxiety thus far.    Problem: Elimination: Goal: Will not experience complications related to bowel motility Outcome: Progressing Pt's LBM was today 12/21/2022.   Problem:  Elimination: Goal: Will not experience complications related to urinary retention Outcome: Progressing Pt is continent of his bladder.  He has not endorsed c/o abdominal pain/ distention or dysuria.    Problem: Safety: Goal: Ability to remain free from injury will improve Outcome: Progressing Pt has remained free from falls thus far. Instructed pt to utilize RN call light for assistance. Hourly rounds performed. Bed in lowest position, locked with two upper side rails engaged. Belongings and call light within reach.    Problem: Skin Integrity: Goal: Risk for impaired skin integrity will decrease Outcome: Progressing Skin integrity monitored and assessed q-shift. Instructed pt to turn q2 hours to prevent further skin impairment. Tubes and drains assessed for device related pressures sores. Pt is continent of both bowel and bladder.  Dressing changes performed per MD's orders.

## 2022-12-22 NOTE — Progress Notes (Signed)
Pt to be discharge home to self care. Reviewed AVS with pt and made him aware of changes to his medications. Informed pt that he had pending prescription to be picked up from the Outpatient Pharmacy (TOC). Taught pt on wound and incision care of his laparoscopic sites. Pt was able to verbalize and teach back what he learned. Removed pt's PIV which was CDI and free from s/sx of infection upon removal. Assisted pt in gathering his belongings. Pt was escorted via wheelchair by NT, Gabby to the front door where his ride awaited to take him home. Pt discharge in stable condition.

## 2022-12-22 NOTE — Progress Notes (Signed)
[facility] Progress Note  Subjective: seen in room, no c/o's. Says his abd pain has "disappeared" since the PD cath was taken out.   Vitals:   12/21/22 2048 12/22/22 0500 12/22/22 0603 12/22/22 0700  BP: (!) 146/94  (!) 150/81 (!) 165/100  Pulse: 83  85 90  Resp: 18  18 18   Temp: 98.2 F (36.8 C)  97.8 F (36.6 C) 97.9 F (36.6 C)  TempSrc: Oral  Oral Oral  SpO2: 100%  100% 100%  Weight:  111.4 kg    Height:        Exam: Gen alert, no distress No jvd or bruits Chest clear bilat to bases RRR no MRG Abd soft obese ntnd no mass or ascites +bs PD cath mid L abdomen +area of round erythema 3x 3 cm, a little less red Ext no LE or UE edema Neuro is alert, Ox 3 , nf       Renal-related home meds: - chlorthalidone 12.5  - valsartan 40 every day - norvasc 5 every day - coreg 6.25 bid      OP PD: does not have Rx yet as he is just starting training     Assessment/ Plan: Abd wall cellulitis/ possible peritonitis/ CKD 5 - possibly related to recent PD cath placement 2 wks ago. Had some abd pain and peritonitis also a possibility. We were unable to get fluid back Monday - after instilling 1 L it would not drain, and there was fibrin coming out in the tube. Then 10/2 we TPA'd the catheter in the afternoon but the PD catheter didn't work when attempted that evening. Concerned he may have peritonitis causing catheter thrombosis. [facility] saw and yesterday they removed the PD cath (no purulence noted) and created a RUE AVF. Does not require dialysis at this time w/ creat 4.0 in a young person. Appreciate rec's by ID for a course of po abx thru 10/28 to cover empirically the most likely potential pathogens for this patient. Is okay for dc today from renal standpoint.  HTN/ BP - on 3-4 HTN meds at home. BP's stable on norvasc/ coreg here. Volume - no vol excess on exam.  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 Follow.  DM2 - w/ hx retinopathy,  diabetic nephropathy and likely gastroparesis (+ h/o N/V intermittently)      Vinson Moselle MD  [facility] 12/22/2022, 10:57 AM  Recent Labs  Lab 12/17/22 2045 12/18/22 1448 12/19/22 0448 12/20/22 0457 12/21/22 0619  HGB 8.7*  --  8.7* 9.0*  --   ALBUMIN 3.1* 2.9*  --   --   --   CALCIUM 8.9  --  8.6* 8.7* 9.0  PHOS  --  3.6  --   --   --   CREATININE 4.28*  --  3.98* 4.06* 4.07*  K 4.1  --  4.0 4.0 4.4   No results for input(s): "IRON", "TIBC", "FERRITIN" in the last 168 hours. Inpatient medications:  amLODipine  5 mg Oral Daily   carvedilol  6.25 mg Oral BID WC   docusate sodium  100 mg Oral BID   ferrous sulfate  325 mg Oral Q breakfast   gabapentin  300 mg Oral BID   gentamicin cream  1 Application Topical Daily   heparin  5,000 Units Subcutaneous Q8H   insulin aspart  0-5 Units Subcutaneous QHS   insulin aspart  0-9 Units Subcutaneous TID WC   senna  1 tablet Oral BID    cefTAZidime (FORTAZ)  IV Stopped (12/21/22 1835)   vancomycin     acetaminophen **OR** acetaminophen, albuterol, heparin sodium (porcine) 3,000 Units in dialysis solution 1.5% low-MG/low-CA 6,000 mL dialysis solution, hydrALAZINE

## 2022-12-23 LAB — CULTURE, BLOOD (ROUTINE X 2)
Culture: NO GROWTH
Culture: NO GROWTH
Special Requests: ADEQUATE
Special Requests: ADEQUATE

## 2022-12-24 ENCOUNTER — Telehealth: Payer: Self-pay

## 2022-12-24 NOTE — Transitions of Care (Post Inpatient/ED Visit) (Signed)
12/24/2022  Name: [name] MRN: [id number] DOB: 05-02-1985  Today's TOC FU Call Status: Today's TOC FU Call Status:: Unsuccessful Call (1st Attempt) Unsuccessful Call (1st Attempt) Date: 12/24/22  Attempted to reach the patient regarding the most recent Inpatient/ED visit.  Follow Up Plan: Additional outreach attempts will be made to reach the patient to complete the Transitions of Care (Post Inpatient/ED visit) call.   Signature  Robyne Peers, RN

## 2022-12-24 NOTE — Anesthesia Postprocedure Evaluation (Signed)
Anesthesia Post Note  Patient: [name] [name]  Procedure(s) Performed: RIGHT BRACHIOCEPHALIC ARTERIOVENOUS (AV) FISTULA CREATION (Right) LAPAROSCOPIC REMOVAL CONTINUOUS AMBULATORY PERITONEAL DIALYSIS  (CAPD) CATHETER     Patient location during evaluation: PACU Anesthesia Type: General Level of consciousness: awake and alert Pain management: pain level controlled Vital Signs Assessment: post-procedure vital signs reviewed and stable Respiratory status: spontaneous breathing, nonlabored ventilation, respiratory function stable and patient connected to nasal cannula oxygen Cardiovascular status: blood pressure returned to baseline and stable Postop Assessment: no apparent nausea or vomiting Anesthetic complications: no   No notable events documented.  Last Vitals:  Vitals:   12/22/22 0603 12/22/22 0700  BP: (!) 150/81 (!) 165/100  Pulse: 85 90  Resp: 18 18  Temp: 36.6 C 36.6 C  SpO2: 100% 100%    Last Pain:  Vitals:   12/22/22 0746  TempSrc:   PainSc: 0-No pain   Pain Goal: Patients Stated Pain Goal: 1 (12/21/22 0817)                 Trevor Iha

## 2022-12-25 ENCOUNTER — Telehealth: Payer: Self-pay

## 2022-12-25 ENCOUNTER — Encounter (HOSPITAL_COMMUNITY): Payer: Self-pay | Admitting: Vascular Surgery

## 2022-12-25 NOTE — Transitions of Care (Post Inpatient/ED Visit) (Signed)
12/25/2022  Name: [name] [name] MRN: [id number] DOB: 1986-03-02  Today's TOC FU Call Status: Today's TOC FU Call Status:: Unsuccessful Call (2nd Attempt) Unsuccessful Call (1st Attempt) Date: 12/24/22 Unsuccessful Call (2nd Attempt) Date: 12/25/22  Attempted to reach the patient regarding the most recent Inpatient/ED visit.  Follow Up Plan: Additional outreach attempts will be made to reach the patient to complete the Transitions of Care (Post Inpatient/ED visit) call.   Signature  Robyne Peers, RN

## 2022-12-25 NOTE — Telephone Encounter (Signed)
Copied from CRM [id number]. Topic: General - Inquiry >> Dec 25, 2022 11:55 AM Patsy Lager T wrote: Reason for CRM: patient was returning Judie Grieve call and request she call him back

## 2022-12-26 ENCOUNTER — Telehealth: Payer: Self-pay

## 2022-12-26 NOTE — Transitions of Care (Post Inpatient/ED Visit) (Signed)
12/26/2022  Name: [name] [name] MRN: [id number] DOB: 02-26-1986  Today's TOC FU Call Status: Today's TOC FU Call Status:: Successful TOC FU Call Completed Unsuccessful Call (1st Attempt) Date: 12/24/22 Unsuccessful Call (2nd Attempt) Date: 12/25/22 [facility] FU Call Complete Date: 12/26/22 Patient's Name and Date of Birth confirmed.  Transition Care Management Follow-up Telephone Call Date of Discharge: 12/22/22 Discharge Facility: [facility]) Type of Discharge: Inpatient Admission Primary Inpatient Discharge Diagnosis:: cellulitis of abdominal wall How have you been since you were released from the hospital?: Better (He said overall, he is doing better, just tired) Any questions or concerns?: No  Items Reviewed: Did you receive and understand the discharge instructions provided?: Yes Medications obtained,verified, and reconciled?: Partial Review Completed Reason for Partial Mediation Review: He said he has all medications and did not have any questions about the med regime. He stated he also has a Dexcom CGM Any new allergies since your discharge?: No Dietary orders reviewed?: Yes Type of Diet Ordered:: heart healthy ,low sodium , carb modified Do you have support at home?: No (He stated he lives alone and has no one to assist if needed)  Medications Reviewed Today: Medications Reviewed Today   Medications were not reviewed in this encounter     Home Care and Equipment/Supplies: Were Home Health Services Ordered?: No Any new equipment or medical supplies ordered?: No  Functional Questionnaire: Do you need assistance with bathing/showering or dressing?: No Do you need assistance with meal preparation?: No Do you need assistance with eating?: No Do you have difficulty maintaining continence: No Do you need assistance with getting out of bed/getting out of a chair/moving?: No Do you have difficulty managing or taking your medications?: No  Follow up appointments  reviewed: PCP Follow-up appointment confirmed?: Yes Date of PCP follow-up appointment?: 12/31/22 Follow-up Provider: [facility] Follow-up appointment confirmed?: Yes Date of Specialist follow-up appointment?: 01/23/23 Follow-Up Specialty Provider:: [facility] Do you need transportation to your follow-up appointment?: No Do you understand care options if your condition(s) worsen?: Yes-patient verbalized understanding    SIGNATURE. Robyne Peers, RN

## 2022-12-26 NOTE — Telephone Encounter (Signed)
I returned the call to the patient and the information is documented in another telephone encounter, TOC call, from today.

## 2022-12-31 ENCOUNTER — Ambulatory Visit: Payer: Medicaid Other | Attending: Internal Medicine | Admitting: Internal Medicine

## 2022-12-31 ENCOUNTER — Encounter (HOSPITAL_COMMUNITY): Payer: Self-pay | Admitting: Vascular Surgery

## 2022-12-31 VITALS — BP 150/84 | HR 81 | Ht 68.0 in | Wt 249.0 lb

## 2022-12-31 DIAGNOSIS — E1159 Type 2 diabetes mellitus with other circulatory complications: Secondary | ICD-10-CM | POA: Diagnosis not present

## 2022-12-31 DIAGNOSIS — Z09 Encounter for follow-up examination after completed treatment for conditions other than malignant neoplasm: Secondary | ICD-10-CM | POA: Diagnosis not present

## 2022-12-31 DIAGNOSIS — N5201 Erectile dysfunction due to arterial insufficiency: Secondary | ICD-10-CM

## 2022-12-31 DIAGNOSIS — K659 Peritonitis, unspecified: Secondary | ICD-10-CM | POA: Diagnosis not present

## 2022-12-31 DIAGNOSIS — E1142 Type 2 diabetes mellitus with diabetic polyneuropathy: Secondary | ICD-10-CM

## 2022-12-31 DIAGNOSIS — N184 Chronic kidney disease, stage 4 (severe): Secondary | ICD-10-CM

## 2022-12-31 DIAGNOSIS — Z2821 Immunization not carried out because of patient refusal: Secondary | ICD-10-CM

## 2022-12-31 DIAGNOSIS — I152 Hypertension secondary to endocrine disorders: Secondary | ICD-10-CM

## 2022-12-31 DIAGNOSIS — Z7984 Long term (current) use of oral hypoglycemic drugs: Secondary | ICD-10-CM

## 2022-12-31 MED ORDER — GABAPENTIN 300 MG PO CAPS
300.0000 mg | ORAL_CAPSULE | Freq: Every day | ORAL | 6 refills | Status: AC
Start: 2022-12-31 — End: ?

## 2022-12-31 MED ORDER — SILDENAFIL CITRATE 100 MG PO TABS
ORAL_TABLET | ORAL | 1 refills | Status: AC
Start: 2022-12-31 — End: ?

## 2022-12-31 NOTE — Progress Notes (Signed)
Patient ID: [name] [name], male    DOB: September 02, 1985  MRN: [id number]  CC: Transition of care visit Date of hospitalization: 9/30 - 12/22/2022 Date of call from case worker: 12/26/2022 Hospitalization Follow-up (Hospitalization f/u. /No questions / concerns/No to flu vax)   Subjective: [name] [name] is a 37 y.o. 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    Patient hospitalized 9/30 - 12/22/2022 with abdominal wall cellulitis/suspected peritonitis. Patient presented with progressive abdominal pain and cellulitis near the site of peritoneal dialysis catheter that was placed 2 weeks prior.  He was started on empiric antibiotics.  PD catheter was removed and AV fistula created instead by vascular surgery.  Patient was discharged on linezolid 600 mg twice a day, levofloxacin 750 mg every other day and metronidazole 500 mg twice a day with completion date of 01/14/2023.  Today: Patient reports that he is taking the antibiotics that he was sent home with from the hospital.  He has not had any fever.  Pain in the abdomen wall has decreased significantly.  Blood pressure noted to be elevated today.  Reports compliance with taking his blood pressure medicines and took them already for today.  These medications include carvedilol 25 mg twice a day, chlorthalidone 12.5 mg daily, amlodipine 10 mg daily and Diovan 40 mg daily.  Does not check his blood pressure at home.  In regards to his diabetes, most recent A1c was 6.5.  He has a continuous glucose monitor.  Reports that his blood sugars stay between 120-140.  Elevated today at 246.   Patient states he drank a red bull before coming here.  Reports being told by his nephrologist that the gabapentin needs to be decreased from 300 mg twice a day to 300 mg once a day due to his kidney function.  States he was also told that the Jardiance dose may need to be decreased.  However I have reviewed the last note from the nephrologist when he was seen last month.  It says to continue the Emmett.  He is wanting to know if there is something different that can be prescribed for his ED.  We had placed him on sildenafil 50 mg on last visit.  He states it does not work.  Also reports that when he climax, he does not ejaculate any semen at all.  He has 2 children who are in foster care.  States that he would like to have more children in the future. Patient Active Problem List   Diagnosis Date Noted   Cellulitis of abdominal wall 12/18/2022   History of amputation of toe (HCC) 06/25/2022   CKD stage 4 due to type 2 diabetes mellitus (HCC) 06/25/2022   Moderately severe recurrent major depression (HCC) 06/21/2022   Cannabis use disorder 06/21/2022   Nausea and vomiting 06/11/2022   Bloating 06/11/2022   Gastritis and gastroduodenitis 06/11/2022   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   Chest pain of uncertain etiology 11/08/2021   Gastroparesis 04/17/2021   Abnormality of gait 10/11/2020   Macroalbuminuric diabetic nephropathy (HCC) 08/10/2020   Hemiparesis of right dominant side due to non-cerebrovascular etiology (HCC) 08/10/2020   Cellulitis,  leg 03/17/2020   Cellulitis of right leg 03/17/2020   Uncontrolled type 2 diabetes mellitus with complication, with long-term current use of insulin    Abscess    Cellulitis 12/05/2019   Cellulitis of left lower extremity    Abscess of left lower extremity    Uncontrolled type 2 diabetes mellitus with hyperglycemia (HCC)    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 2 diabetes mellitus (HCC)    Cellulitis of foot, left 11/22/2016   Diabetic neuropathy  (HCC) 11/22/2016   Fracture of 2nd metatarsal 11/22/2016   Fracture of 3rd metatarsal 11/22/2016   Postprocedural non-healing wound 11/22/2016   Acute cystitis with positive culture    Cutaneous abscess of left foot    Poorly controlled type 2 diabetes mellitus with complication (HCC) 10/27/2016   Hyperlipidemia 10/17/2016   Depression 10/17/2016   Neck pain    Asthma 04/11/2016   Uncontrolled type 2 diabetes mellitus with complication    Obesity, morbid (HCC) 09/06/2015   Hypertension associated with diabetes (HCC) 09/06/2015     Current Outpatient Medications on File Prior to Visit  Medication Sig Dispense Refill   acetaminophen (TYLENOL) 500 MG tablet Take 1,000 mg by mouth every 6 (six) hours as needed for moderate pain.     amLODipine (NORVASC) 10 MG tablet TAKE ONE TABLET BY MOUTH ONCE DAILY (AM) 90 tablet 1   carvedilol (COREG) 25 MG tablet TAKE 1 TABLET BY MOUTH 2 (TWO) TIMES DAILY WITH A MEAL (AM+PM) 180 tablet 1   chlorthalidone (HYGROTON) 25 MG tablet Take 12.5 mg by mouth daily.     Continuous Glucose Sensor (DEXCOM G6 SENSOR) MISC USE DAILY AS DIRECTED. ONE SENSOR LAST FOR 10 DAYS. 3 each 3   Continuous Glucose Transmitter (DEXCOM G6 TRANSMITTER) MISC USE AS DIRECTED DAILY 1 each 0   famotidine (PEPCID) 20 MG tablet TAKE 1 TABLET (20 MG TOTAL) BY MOUTH AT BEDTIME. 90 tablet 3   ferrous sulfate 325 (65 FE) MG tablet Take 1 tablet (325 mg total) by mouth daily with breakfast. 100 tablet 1   JARDIANCE 10 MG TABS tablet TAKE ONE TABLET BY MOUTH ON MONDAY,WEDNESDAY AND FRIDAY (AM). 12 tablet 2   levofloxacin (LEVAQUIN) 750 MG tablet Take 1 tablet (750 mg total) by mouth every other day for 23 days. 7 tablet 0   linezolid (ZYVOX) 600 MG tablet Take 1 tablet (600 mg total) by mouth 2 (two) times daily for 23 days. 46 tablet 0   metroNIDAZOLE (FLAGYL) 500 MG tablet Take 1 tablet (500 mg total) by mouth 2 (two) times daily for 23 days. 46 tablet 0   pravastatin (PRAVACHOL) 20 MG  tablet TAKE 1 TABLET (20 MG TOTAL) BY MOUTH DAILY. (BEDTIME) 90 tablet 1   saccharomyces boulardii (FLORASTOR) 250 MG capsule Take 1 capsule (250 mg total) by mouth 2 (two) times daily for 23 days. 50 capsule 0   valsartan (DIOVAN) 40 MG tablet TAKE 1 TABLET (40 MG TOTAL) BY MOUTH DAILY. (AM) 90 tablet 1   No current facility-administered medications on file prior to visit.    No Known Allergies  Social History   Socioeconomic History   Marital status: Single    Spouse name: Not on file   Number of children: 2   Years of education: Not on file   Highest education level: Not on file  Occupational History   Occupation: disabled  Tobacco Use   Smoking status: Never   Smokeless tobacco: Never  Vaping Use   Vaping status: Former  Substances: THC   Devices: THC  Substance and Sexual Activity   Alcohol use: Yes    Comment: 11/06/2016 "I'll drink a little at parties; maybe 5 times/year"   Drug use: Yes    Types: Marijuana    Comment: 11/06/2016 "stopped ~ 1 month ago"   Sexual activity: Never    Birth control/protection: Diaphragm  Other Topics Concern   Not on file  Social History Narrative   Not on file   Social Determinants of Health   Financial Resource Strain: Medium Risk (12/31/2022)   Overall Financial Resource Strain (CARDIA)    Difficulty of Paying Living Expenses: Somewhat hard  Food Insecurity: Food Insecurity Present (12/31/2022)   Hunger Vital Sign    Worried About Running Out of Food in the Last Year: Often true    Ran Out of Food in the Last Year: Often true  Transportation Needs: No Transportation Needs (12/31/2022)   PRAPARE - Administrator, Civil Service (Medical): No    Lack of Transportation (Non-Medical): No  Physical Activity: Inactive (12/31/2022)   Exercise Vital Sign    Days of Exercise per Week: 0 days    Minutes of Exercise per Session: 0 min  Stress: Stress Concern Present (12/31/2022)   [employer] of Occupational Health -  Occupational Stress Questionnaire    Feeling of Stress : Very much  Social Connections: Socially Isolated (12/31/2022)   Social Connection and Isolation Panel [NHANES]    Frequency of Communication with Friends and Family: More than three times a week    Frequency of Social Gatherings with Friends and Family: Twice a week    Attends Religious Services: Never    Database administrator or Organizations: No    Attends Banker Meetings: Never    Marital Status: Divorced  Catering manager Violence: Not At Risk (12/31/2022)   Humiliation, Afraid, Rape, and Kick questionnaire    Fear of Current or Ex-Partner: No    Emotionally Abused: No    Physically Abused: No    Sexually Abused: No    Family History  Problem Relation Age of Onset   Diabetes type II Mother    Coronary artery disease Mother    Diabetes type II Father    Colon cancer Maternal Grandfather    Lung cancer Maternal Grandfather    Prostate cancer Maternal Grandfather    Diabetes Maternal Grandfather    Hypertension Maternal Grandfather    Heart disease Paternal Grandfather    Parkinson's disease Paternal Aunt    Multiple sclerosis Paternal Aunt     Past Surgical History:  Procedure Laterality Date   AMPUTATION Left 11/05/2016   Procedure: AMPUTATION LEFT GREAT TOE;  Surgeon: Nadara Mustard, MD;  Location: [facility] OR;  Service: Orthopedics;  Laterality: Left;   AV FISTULA PLACEMENT Right 12/21/2022   Procedure: RIGHT BRACHIOCEPHALIC ARTERIOVENOUS (AV) FISTULA CREATION;  Surgeon: Maeola Harman, MD;  Location[facility] OR;  Service: Vascular;  Laterality: Right;   BIOPSY  06/11/2022   Procedure: BIOPSY;  Surgeon: Shellia Cleverly, DO;  Location: [facility];  Service: Gastroenterology;;   CAPD INSERTION N/A 11/29/2022   Procedure: LAPAROSCOPIC INSERTION CONTINUOUS AMBULATORY PERITONEAL DIALYSIS  (CAPD) CATHETER;  Surgeon: Leonie Douglas, MD;  Location: [facility] OR;  Service: Vascular;  Laterality: N/A;   CAPD  REMOVAL N/A 12/21/2022   Procedure: LAPAROSCOPIC REMOVAL CONTINUOUS AMBULATORY PERITONEAL DIALYSIS  (CAPD) CATHETER;  Surgeon: Maeola Harman, MD;  Location: [facility] OR;  Service: Vascular;  Laterality:  N/A;   CATARACT EXTRACTION Left 07/2020   DRESSING CHANGE UNDER ANESTHESIA N/A 04/13/2016   Procedure: DRESSING CHANGE UNDER ANESTHESIA;  Surgeon: Luretha Murphy, MD;  Location: [facility];  Service: General;  Laterality: N/A;   ESOPHAGOGASTRODUODENOSCOPY (EGD) WITH PROPOFOL N/A 06/11/2022   Procedure: ESOPHAGOGASTRODUODENOSCOPY (EGD) WITH PROPOFOL;  Surgeon: Shellia Cleverly, DO;  Location: [facility];  Service: Gastroenterology;  Laterality: N/A;   INCISION AND DRAINAGE ABSCESS Right 09/03/2015   Procedure: INCISION AND DRAINAGE ABSCESS;  Surgeon: Dominica Severin, MD;  Location: [facility];  Service: Orthopedics;  Laterality: Right;   INCISION AND DRAINAGE PERIRECTAL ABSCESS N/A 04/11/2016   Procedure: IRRIGATION AND DEBRIDEMENT RECTAL ABSCESS;  Surgeon: Luretha Murphy, MD;  Location: [facility];  Service: General;  Laterality: N/A;   INGUINAL HERNIA REPAIR  1988; 1989   x2    IRRIGATION AND DEBRIDEMENT ABSCESS Left 08/31/2015   Procedure: IRRIGATION AND DEBRIDEMENT ABSCESS LEFT SHOULDER;  Surgeon: Ovidio Kin, MD;  Location: [facility];  Service: General;  Laterality: Left;   RETINAL LASER PROCEDURE Bilateral    TUMOR EXCISION     neck   WOUND DEBRIDEMENT N/A 02/24/2016   Procedure: SACRAL WOUND DEBRIDEMENT;  Surgeon: Violeta Gelinas, MD;  Location: [facility] OR;  Service: General;  Laterality: N/A;    ROS: Review of Systems Negative except as stated above  PHYSICAL EXAM: BP (!) 150/84   Pulse 81   Ht 5\' 8"  (1.727 m)   Wt 249 lb (112.9 kg)   SpO2 99%   BMI 37.86 kg/m   Physical Exam   General appearance - alert, well appearing, and in no distress Mental status - normal mood, behavior, speech, dress, motor activity, and thought processes Chest - clear to auscultation, no wheezes, rales or  rhonchi, symmetric air entry Heart - normal rate, regular rhythm, normal S1, S2, no murmurs, rubs, clicks or gallops Abdomen -a few hyperpigmented scarring marks over the abdominal wall but no signs of skin infection.     Latest Ref Rng & Units 12/21/2022   10:26 PM 12/21/2022    6:19 AM 12/20/2022    4:57 AM  CMP  Glucose 70 - 99 mg/dL 782  956  213   BUN 6 - 20 mg/dL  50  52   Creatinine 0.86 - 1.24 mg/dL  5.78  4.69   Sodium 629 - 145 mmol/L  134  133   Potassium 3.5 - 5.1 mmol/L  4.4  4.0   Chloride 98 - 111 mmol/L  102  105   CO2 22 - 32 mmol/L  19  19   Calcium 8.9 - 10.3 mg/dL  9.0  8.7    Lipid Panel     Component Value Date/Time   CHOL 149 03/21/2021 1536   TRIG 104 03/21/2021 1536   HDL 37 (L) 03/21/2021 1536   CHOLHDL 4.0 03/21/2021 1536   CHOLHDL 4.6 12/06/2019 0749   VLDL 23 12/06/2019 0749   LDLCALC 93 03/21/2021 1536    CBC    Component Value Date/Time   WBC 16.7 (H) 12/20/2022 0457   RBC 3.09 (L) 12/20/2022 0457   HGB 9.0 (L) 12/20/2022 0457   HGB 9.7 (L) 06/25/2022 1627   HCT 27.9 (L) 12/20/2022 0457   HCT 28.7 (L) 06/25/2022 1627   PLT 274 12/20/2022 0457   PLT 258 06/25/2022 1627   MCV 90.3 12/20/2022 0457   MCV 90 06/25/2022 1627   MCH 29.1 12/20/2022 0457   MCHC 32.3 12/20/2022 0457   RDW 12.8 12/20/2022  0457   RDW 13.7 06/25/2022 1627   LYMPHSABS 1.3 12/20/2022 0457   LYMPHSABS 1.7 01/27/2020 1209   MONOABS 1.5 (H) 12/20/2022 0457   EOSABS 0.3 12/20/2022 0457   EOSABS 0.1 01/27/2020 1209   BASOSABS 0.1 12/20/2022 0457   BASOSABS 0.1 01/27/2020 1209   Lab Results  Component Value Date   HGBA1C 6.5 (H) 12/18/2022    ASSESSMENT AND PLAN:  1. Hospital discharge follow-up   2. Peritonitis (HCC) Patient advised to complete the course of the antibiotics that he is taking that include linezolid 600 mg twice a day, levofloxacin 750 mg daily and metronidazole 500 mg twice a day until completion date of 01/14/2023  3. Type 2 diabetes  mellitus with peripheral neuropathy (HCC) Recent A1c at goal and reported blood sugar readings are good except it was noted to be elevated today after he drank a red bull.  Advised to stay away from sugary drinks.  He did have his continuous glucose monitor reader with him but I had difficulty accessing the data.  He will follow-up with the clinical pharmacist to make sure that his blood sugars are good. - gabapentin (NEURONTIN) 300 MG capsule; Take 1 capsule (300 mg total) by mouth daily.  Dispense: 30 capsule; Refill: 6  4. Hypertension associated with diabetes (HCC) Not at goal but better on repeat.  Advised to discontinue drinking red bull.  He will continue blood pressure medications as listed above.  5. Erectile dysfunction due to arterial insufficiency We will try to increase the dose of sildenafil to 100 mg as needed. - sildenafil (VIAGRA) 100 MG tablet; 1 tab PO 1/2 hr prior to intercourse PRN.  Limit use to 1 tab/24 hr  Dispense: 10 tablet; Refill: 1  6. CKD (chronic kidney disease) stage 4, GFR 15-29 ml/min (HCC) Patient now has an AV fistula in the right upper arm.  7. Influenza vaccination declined       Patient was given the opportunity to ask questions.  Patient verbalized understanding of the plan and was able to repeat key elements of the plan.   This documentation was completed using Paediatric nurse.  Any transcriptional errors are unintentional.  No orders of the defined types were placed in this encounter.    Requested Prescriptions   Signed Prescriptions Disp Refills   gabapentin (NEURONTIN) 300 MG capsule 30 capsule 6    Sig: Take 1 capsule (300 mg total) by mouth daily.   sildenafil (VIAGRA) 100 MG tablet 10 tablet 1    Sig: 1 tab PO 1/2 hr prior to intercourse PRN.  Limit use to 1 tab/24 hr    Return in about 3 months (around 04/02/2023).  Jonah Blue, MD, FACP

## 2022-12-31 NOTE — Patient Instructions (Signed)
Please complete  the course of your antibiotics. I have sent a prescription to your pharmacy for increased dose of sildenafil which is Viagra. Decrease gabapentin to 300 mg once a day. Please avoid the sugary drinks including the red bull.

## 2023-01-10 LAB — LAB REPORT - SCANNED
Creatinine, POC: 121.3 mg/dL
EGFR: 17

## 2023-01-14 ENCOUNTER — Ambulatory Visit (HOSPITAL_COMMUNITY): Payer: Medicaid Other | Admitting: Mental Health

## 2023-01-14 ENCOUNTER — Encounter (HOSPITAL_COMMUNITY): Payer: Self-pay

## 2023-01-14 DIAGNOSIS — F332 Major depressive disorder, recurrent severe without psychotic features: Secondary | ICD-10-CM | POA: Diagnosis not present

## 2023-01-14 DIAGNOSIS — F1290 Cannabis use, unspecified, uncomplicated: Secondary | ICD-10-CM

## 2023-01-14 NOTE — Progress Notes (Unsigned)
THERAPIST PROGRESS NOTE Virtual Visit via Video Note  I connected with Saralyn Pilar on 01/14/23 at  3:00 PM EDT by a video enabled telemedicine application and verified that I am speaking with the correct person using two identifiers.  Location: Patient: work- [employer]. - [place] Provider: home office   I discussed the limitations of evaluation and management by telemedicine and the availability of in person appointments. The patient expressed understanding and agreed to proceed.  I discussed the assessment and treatment plan with the patient. The patient was provided an opportunity to ask questions and all were answered. The patient agreed with the plan and demonstrated an understanding of the instructions.   The patient was advised to call back or seek an in-person evaluation if the symptoms worsen or if the condition fails to improve as anticipated.  I provided 40 minutes of non-face-to-face time during this encounter.   Dorris Singh, [facility]   Session Time: 3:05 pm( 40 minutes)  Participation Level: Active  Behavioral Response: CasualAlertEuthymic  Type of Therapy: Individual Therapy  Treatment Goals addressed: STG: "Dealing with stress." Agamjot will increase management of low mood and stressors AEB development of x 3 effective distress tolerance and emotional regulation coping skills within the next 90 days.   ProgressTowards Goals: Progressing  Interventions: Supportive  Summary: Yaphet Melnyk is a 37 y.o. male who presents with dx of Major depression moderate and cannabis use disorder. Rohun presents for session alert and oriented; mood and affect adequate; stable. Speech clear and coherent at normal rate and tone. Shares to have been doing ok and for depressive sxs to be off and on with thoughts of his children. Notes event of having to be hospitalized due to complications with dialysis infection with placement of cathedar and not pleased about having to have  dialysis the "old fashion way" Shares for depression to increase on the weekends in which he finds it hard to have things to do. Shares thoughts of feeling as if he is so close to children but not able to see them with them having been adopted by previous foster parents. Shares thoughts on mother's request for him to move back to [place] in which he would be closer to family and explored with therapist benefits and drawbacks of returning to his home state. Explores with therapist working to increase quality of life and things in which he can control. States to feel as if this move will be a good thing for him. Denies SI/HI. Ongoing progress with goals and shares ability to process emotions and cope with stress adequately; copes with pet dog and talking on phone to supports.   Suicidal/Homicidal: Nowithout intent/plan  Therapist Response: Therapist engaged Philippos in therapy session. Provided safe space for Wilbur to share thoughts and feelings related to sxs of depression and coping skills used. Provided support and encouragement; validated feelings. Engaged in processing things in which he can vs. Can not control and working to build a quality of life and deserving of a good life and pursuit of happiness. Explored choice to move to [place] and explored presence of support there and ability to have needs met. Explored use of coping skills to manage emotions and supported in navigating feelings of children. Encouraged working to engage in balanced thoughts and engagement in community and behavioral activation to support in managing low moods as they present. Reviewed session and provided follow up.   Plan: Return again in x 8 weeks.  Diagnosis: Moderately severe recurrent major  depression (HCC)  Cannabis use disorder  Collaboration of Care: Other None  Patient/Guardian was advised Release of Information must be obtained prior to any record release in order to collaborate their care with an outside provider.  Patient/Guardian was advised if they have not already done so to contact the registration department to sign all necessary forms in order for [place] to release information regarding their care.   Consent: Patient/Guardian gives verbal consent for treatment and assignment of benefits for services provided during this visit. Patient/Guardian expressed understanding and agreed to proceed.   Stephan Minister [place], [facility] 01/14/2023

## 2023-01-15 ENCOUNTER — Other Ambulatory Visit: Payer: Self-pay | Admitting: *Deleted

## 2023-01-15 DIAGNOSIS — N185 Chronic kidney disease, stage 5: Secondary | ICD-10-CM

## 2023-01-22 ENCOUNTER — Other Ambulatory Visit: Payer: Self-pay | Admitting: Internal Medicine

## 2023-01-23 ENCOUNTER — Ambulatory Visit (INDEPENDENT_AMBULATORY_CARE_PROVIDER_SITE_OTHER): Payer: Medicaid Other | Admitting: Physician Assistant

## 2023-01-23 ENCOUNTER — Ambulatory Visit (HOSPITAL_COMMUNITY)
Admission: RE | Admit: 2023-01-23 | Discharge: 2023-01-23 | Disposition: A | Discharge status: Home / Self Care | Payer: Medicaid Other | Source: Ambulatory Visit | Attending: Vascular Surgery | Admitting: Vascular Surgery

## 2023-01-23 VITALS — BP 108/68 | HR 70 | Temp 98.1°F | Ht 66.0 in | Wt 239.3 lb

## 2023-01-23 DIAGNOSIS — N185 Chronic kidney disease, stage 5: Secondary | ICD-10-CM | POA: Insufficient documentation

## 2023-01-23 NOTE — Progress Notes (Signed)
Office Note     CC:  follow up Requesting Provider:  Marcine Matar, MD  HPI: [name] [name] is a 37 y.o. ([birth year]) male who presents status post right brachiocephalic fistula creation as well as PD catheter removal by Dr. Randie Heinz on 12/21/2022.  He underwent PD catheter placement by Dr. Lenell Antu on 11/29/2022.  PD catheter was removed due to infection.  He is not yet on dialysis however nephrology asked [place] to place a fistula due to CKD stage IV.  He denies steal symptoms in his right hand.  He believes the incision is well-healed.  Incisions of abdomen are also healed.  CKD is managed by Dr. Valentino Nose.  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   Past Medical History:  Diagnosis Date   Anemia    Arthritis    "neck; maybe in my fingers" (11/06/2016)   Asthma    Bipolar disorder (HCC)    "IED: intentional deficit disorder"   Chronic kidney disease (CKD), stage III (moderate) (HCC)    Depression    Heart murmur    "when I was a child"   Hypertension    Type II diabetes mellitus (HCC)     Past Surgical History:  Procedure Laterality Date   AMPUTATION Left 11/05/2016   Procedure: AMPUTATION LEFT GREAT TOE;  Surgeon: Nadara Mustard, MD;  Location: [facility] OR;  Service: Orthopedics;  Laterality: Left;   AV FISTULA PLACEMENT Right 12/21/2022   Procedure: RIGHT BRACHIOCEPHALIC ARTERIOVENOUS (AV) FISTULA CREATION;  Surgeon: Maeola Harman, MD;  Location: [facility] OR;  Service: Vascular;  Laterality: Right;   BIOPSY  06/11/2022   Procedure: BIOPSY;  Surgeon: Shellia Cleverly, DO;  Location: [facility];  Service: Gastroenterology;;   CAPD INSERTION N/A 11/29/2022   Procedure: LAPAROSCOPIC INSERTION CONTINUOUS AMBULATORY PERITONEAL DIALYSIS  (CAPD) CATHETER;  Surgeon: Leonie Douglas, MD;  Location: [facility] OR;  Service: Vascular;  Laterality: N/A;   CAPD REMOVAL N/A 12/21/2022   Procedure: LAPAROSCOPIC REMOVAL CONTINUOUS  AMBULATORY PERITONEAL DIALYSIS  (CAPD) CATHETER;  Surgeon: Maeola Harman, MD;  Location: [facility] OR;  Service: Vascular;  Laterality: N/A;   CATARACT EXTRACTION Left 07/2020   DRESSING CHANGE UNDER ANESTHESIA N/A 04/13/2016   Procedure: DRESSING CHANGE UNDER ANESTHESIA;  Surgeon: Luretha Murphy, MD;  Location: [facility];  Service: General;  Laterality: N/A;   ESOPHAGOGASTRODUODENOSCOPY (EGD) WITH PROPOFOL N/A 06/11/2022   Procedure: ESOPHAGOGASTRODUODENOSCOPY (EGD) WITH PROPOFOL;  Surgeon: Shellia Cleverly, DO;  Location: [facility];  Service: Gastroenterology;  Laterality: N/A;   INCISION AND DRAINAGE ABSCESS Right 09/03/2015   Procedure: INCISION AND DRAINAGE ABSCESS;  Surgeon: Dominica Severin, MD;  Location: [facility];  Service: Orthopedics;  Laterality: Right;   INCISION AND DRAINAGE PERIRECTAL ABSCESS N/A 04/11/2016   Procedure: IRRIGATION AND DEBRIDEMENT RECTAL ABSCESS;  Surgeon: Luretha Murphy, MD;  Location: [facility];  Service: General;  Laterality: N/A;   INGUINAL HERNIA REPAIR  1988; 1989   x2    IRRIGATION AND DEBRIDEMENT ABSCESS Left 08/31/2015   Procedure: IRRIGATION AND DEBRIDEMENT ABSCESS LEFT SHOULDER;  Surgeon: Ovidio Kin, MD;  Location: [facility];  Service: General;  Laterality: Left;   RETINAL LASER PROCEDURE Bilateral    TUMOR EXCISION     neck   WOUND DEBRIDEMENT N/A 02/24/2016   Procedure: SACRAL WOUND DEBRIDEMENT;  Surgeon: Violeta Gelinas, MD;  Location: [facility] OR;  Service: General;  Laterality: N/A;    Social History  Socioeconomic History   Marital status: Single    Spouse name: Not on file   Number of children: 2   Years of education: Not on file   Highest education level: Not on file  Occupational History   Occupation: disabled  Tobacco Use   Smoking status: Never   Smokeless tobacco: Never  Vaping Use   Vaping status: Former   Substances: THC   Devices: THC  Substance and Sexual Activity   Alcohol use: Yes    Comment: 11/06/2016 "I'll drink a little at  parties; maybe 5 times/year"   Drug use: Yes    Types: Marijuana    Comment: 11/06/2016 "stopped ~ 1 month ago"   Sexual activity: Never    Birth control/protection: Diaphragm  Other Topics Concern   Not on file  Social History Narrative   Not on file   Social Determinants of Health   Financial Resource Strain: Medium Risk (12/31/2022)   Overall Financial Resource Strain (CARDIA)    Difficulty of Paying Living Expenses: Somewhat hard  Food Insecurity: Food Insecurity Present (12/31/2022)   Hunger Vital Sign    Worried About Running Out of Food in the Last Year: Often true    Ran Out of Food in the Last Year: Often true  Transportation Needs: No Transportation Needs (12/31/2022)   PRAPARE - Administrator, Civil Service (Medical): No    Lack of Transportation (Non-Medical): No  Physical Activity: Inactive (12/31/2022)   Exercise Vital Sign    Days of Exercise per Week: 0 days    Minutes of Exercise per Session: 0 min  Stress: Stress Concern Present (12/31/2022)   [employer] of Occupational Health - Occupational Stress Questionnaire    Feeling of Stress : Very much  Social Connections: Socially Isolated (12/31/2022)   Social Connection and Isolation Panel [NHANES]    Frequency of Communication with Friends and Family: More than three times a week    Frequency of Social Gatherings with Friends and Family: Twice a week    Attends Religious Services: Never    Database administrator or Organizations: No    Attends Banker Meetings: Never    Marital Status: Divorced  Catering manager Violence: Not At Risk (12/31/2022)   Humiliation, Afraid, Rape, and Kick questionnaire    Fear of Current or Ex-Partner: No    Emotionally Abused: No    Physically Abused: No    Sexually Abused: No    Family History  Problem Relation Age of Onset   Diabetes type II Mother    Coronary artery disease Mother    Diabetes type II Father    Colon cancer Maternal  Grandfather    Lung cancer Maternal Grandfather    Prostate cancer Maternal Grandfather    Diabetes Maternal Grandfather    Hypertension Maternal Grandfather    Heart disease Paternal Grandfather    Parkinson's disease Paternal Aunt    Multiple sclerosis Paternal Aunt     Current Outpatient Medications  Medication Sig Dispense Refill   acetaminophen (TYLENOL) 500 MG tablet Take 1,000 mg by mouth every 6 (six) hours as needed for moderate pain.     amLODipine (NORVASC) 10 MG tablet TAKE ONE TABLET BY MOUTH ONCE DAILY (AM) 90 tablet 1   carvedilol (COREG) 25 MG tablet TAKE 1 TABLET BY MOUTH 2 (TWO) TIMES DAILY WITH A MEAL (AM+PM) 180 tablet 1   chlorthalidone (HYGROTON) 25 MG tablet Take 12.5 mg by mouth daily.  Continuous Glucose Sensor (DEXCOM G6 SENSOR) MISC USE DAILY AS DIRECTED. ONE SENSOR LAST FOR 10 DAYS. 3 each 3   Continuous Glucose Transmitter (DEXCOM G6 TRANSMITTER) MISC USE AS DIRECTED DAILY 1 each 0   famotidine (PEPCID) 20 MG tablet TAKE 1 TABLET (20 MG TOTAL) BY MOUTH AT BEDTIME. 90 tablet 3   FEROSUL 325 (65 Fe) MG tablet TAKE 1 TABLET (325 MG TOTAL) BY MOUTH DAILY WITH BREAKFAST. 100 tablet 1   gabapentin (NEURONTIN) 300 MG capsule Take 1 capsule (300 mg total) by mouth daily. 30 capsule 6   JARDIANCE 10 MG TABS tablet TAKE ONE TABLET BY MOUTH ON MONDAY,WEDNESDAY AND FRIDAY (AM). 12 tablet 2   pravastatin (PRAVACHOL) 20 MG tablet TAKE 1 TABLET (20 MG TOTAL) BY MOUTH DAILY. (BEDTIME) 90 tablet 1   sildenafil (VIAGRA) 100 MG tablet 1 tab PO 1/2 hr prior to intercourse PRN.  Limit use to 1 tab/24 hr 10 tablet 1   valsartan (DIOVAN) 40 MG tablet TAKE 1 TABLET (40 MG TOTAL) BY MOUTH DAILY. (AM) 90 tablet 1   No current facility-administered medications for this visit.    No Known Allergies   REVIEW OF SYSTEMS:   [X]  denotes positive finding, [ ]  denotes negative finding Cardiac  Comments:  Chest pain or chest pressure:    Shortness of breath upon exertion:    Short  of breath when lying flat:    Irregular heart rhythm:        Vascular    Pain in calf, thigh, or hip brought on by ambulation:    Pain in feet at night that wakes you up from your sleep:     Blood clot in your veins:    Leg swelling:         Pulmonary    Oxygen at home:    Productive cough:     Wheezing:         Neurologic    Sudden weakness in arms or legs:     Sudden numbness in arms or legs:     Sudden onset of difficulty speaking or slurred speech:    Temporary loss of vision in one eye:     Problems with dizziness:         Gastrointestinal    Blood in stool:     Vomited blood:         Genitourinary    Burning when urinating:     Blood in urine:        Psychiatric    Major depression:         Hematologic    Bleeding problems:    Problems with blood clotting too easily:        Skin    Rashes or ulcers:        Constitutional    Fever or chills:      PHYSICAL EXAMINATION:  Vitals:   01/23/23 1446  BP: 108/68  Pulse: 70  Temp: 98.1 F (36.7 C)  SpO2: 97%  Weight: 239 lb 4.8 oz (108.5 kg)  Height: 5\' 6"  (1.676 m)    General:  WDWN in NAD; vital signs documented above Gait: Not observed HENT: WNL, normocephalic Pulmonary: normal non-labored breathing , without Rales, rhonchi,  wheezing Cardiac: regular HR Abdomen: soft, NT, no masses Skin: without rashes Vascular Exam/Pulses: Radial signal by Doppler Extremities: without ischemic changes, without Gangrene , without cellulitis; without open wounds; right cephalic vein without thrill Musculoskeletal: no muscle wasting or atrophy  Neurologic: A&O X  3 Psychiatric:  The pt has Normal affect.   Non-Invasive Vascular Imaging:   Occluded right brachiocephalic fistula    ASSESSMENT/PLAN:: 37 y.o. male status post PD catheter removal as well as right brachiocephalic fistula creation by Dr. Randie Heinz on 12/21/2022  PD catheter removed in its entirety and all incisions of the abdomen are healed.  No signs of  peritonitis on exam.  Unfortunately the right brachiocephalic fistula has already thrombosed based on physical exam as well as duplex.  Given his right sided partial paralysis he would like to exhaust all options of the fistula in the right arm prior to moving to the left arm.  The basilic vein was spared during recent fistula creation.  He is not yet on hemodialysis.  Plan will be to proceed with right arm basilic vein fistula creation with the next available surgeon in the next week or 2.  Case was discussed in detail with the patient and is agreeable to proceed.  Patient's phone number was taken down today and he will be called by our surgery scheduler in the near future.   Emilie Rutter, PA-C [facility] [phone]  Clinic MD:   Randie Heinz

## 2023-01-23 NOTE — H&P (View-Only) (Signed)
 Office Note     CC:  follow up Requesting Provider:  Marcine Matar, MD  HPI: [name] [name] is a 37 y.o. ([birth year]) male who presents status post right brachiocephalic fistula creation as well as PD catheter removal by Dr. Randie Heinz on 12/21/2022.  He underwent PD catheter placement by Dr. Lenell Antu on 11/29/2022.  PD catheter was removed due to infection.  He is not yet on dialysis however nephrology asked [place] to place a fistula due to CKD stage IV.  He denies steal symptoms in his right hand.  He believes the incision is well-healed.  Incisions of abdomen are also healed.  CKD is managed by Dr. Valentino Nose.  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   Past Medical History:  Diagnosis Date   Anemia    Arthritis    "neck; maybe in my fingers" (11/06/2016)   Asthma    Bipolar disorder (HCC)    "IED: intentional deficit disorder"   Chronic kidney disease (CKD), stage III (moderate) (HCC)    Depression    Heart murmur    "when I was a child"   Hypertension    Type II diabetes mellitus (HCC)     Past Surgical History:  Procedure Laterality Date   AMPUTATION Left 11/05/2016   Procedure: AMPUTATION LEFT GREAT TOE;  Surgeon: Nadara Mustard, MD;  Location: [facility] OR;  Service: Orthopedics;  Laterality: Left;   AV FISTULA PLACEMENT Right 12/21/2022   Procedure: RIGHT BRACHIOCEPHALIC ARTERIOVENOUS (AV) FISTULA CREATION;  Surgeon: Maeola Harman, MD;  Location: [facility] OR;  Service: Vascular;  Laterality: Right;   BIOPSY  06/11/2022   Procedure: BIOPSY;  Surgeon: Shellia Cleverly, DO;  Location: [facility];  Service: Gastroenterology;;   CAPD INSERTION N/A 11/29/2022   Procedure: LAPAROSCOPIC INSERTION CONTINUOUS AMBULATORY PERITONEAL DIALYSIS  (CAPD) CATHETER;  Surgeon: Leonie Douglas, MD;  Location: [facility];  Service: Vascular;  Laterality: N/A;   CAPD REMOVAL N/A 12/21/2022   Procedure: LAPAROSCOPIC REMOVAL CONTINUOUS  AMBULATORY PERITONEAL DIALYSIS  (CAPD) CATHETER;  Surgeon: Maeola Harman, MD;  Location: [facility] OR;  Service: Vascular;  Laterality: N/A;   CATARACT EXTRACTION Left 07/2020   DRESSING CHANGE UNDER ANESTHESIA N/A 04/13/2016   Procedure: DRESSING CHANGE UNDER ANESTHESIA;  Surgeon: Luretha Murphy, MD;  Location: [facility];  Service: General;  Laterality: N/A;   ESOPHAGOGASTRODUODENOSCOPY (EGD) WITH PROPOFOL N/A 06/11/2022   Procedure: ESOPHAGOGASTRODUODENOSCOPY (EGD) WITH PROPOFOL;  Surgeon: Shellia Cleverly, DO;  Location: [facility];  Service: Gastroenterology;  Laterality: N/A;   INCISION AND DRAINAGE ABSCESS Right 09/03/2015   Procedure: INCISION AND DRAINAGE ABSCESS;  Surgeon: Dominica Severin, MD;  Location: [facility];  Service: Orthopedics;  Laterality: Right;   INCISION AND DRAINAGE PERIRECTAL ABSCESS N/A 04/11/2016   Procedure: IRRIGATION AND DEBRIDEMENT RECTAL ABSCESS;  Surgeon: Luretha Murphy, MD;  Location: [facility];  Service: General;  Laterality: N/A;   INGUINAL HERNIA REPAIR  1988; 1989   x2    IRRIGATION AND DEBRIDEMENT ABSCESS Left 08/31/2015   Procedure: IRRIGATION AND DEBRIDEMENT ABSCESS LEFT SHOULDER;  Surgeon: Ovidio Kin, MD;  Location: [facility];  Service: General;  Laterality: Left;   RETINAL LASER PROCEDURE Bilateral    TUMOR EXCISION     neck   WOUND DEBRIDEMENT N/A 02/24/2016   Procedure: SACRAL WOUND DEBRIDEMENT;  Surgeon: Violeta Gelinas, MD;  Location: [facility] OR;  Service: General;  Laterality: N/A;    Social History  Socioeconomic History   Marital status: Single    Spouse name: Not on file   Number of children: 2   Years of education: Not on file   Highest education level: Not on file  Occupational History   Occupation: disabled  Tobacco Use   Smoking status: Never   Smokeless tobacco: Never  Vaping Use   Vaping status: Former   Substances: THC   Devices: THC  Substance and Sexual Activity   Alcohol use: Yes    Comment: 11/06/2016 "I'll drink a little at  parties; maybe 5 times/year"   Drug use: Yes    Types: Marijuana    Comment: 11/06/2016 "stopped ~ 1 month ago"   Sexual activity: Never    Birth control/protection: Diaphragm  Other Topics Concern   Not on file  Social History Narrative   Not on file   Social Determinants of Health   Financial Resource Strain: Medium Risk (12/31/2022)   Overall Financial Resource Strain (CARDIA)    Difficulty of Paying Living Expenses: Somewhat hard  Food Insecurity: Food Insecurity Present (12/31/2022)   Hunger Vital Sign    Worried About Running Out of Food in the Last Year: Often true    Ran Out of Food in the Last Year: Often true  Transportation Needs: No Transportation Needs (12/31/2022)   PRAPARE - Administrator, Civil Service (Medical): No    Lack of Transportation (Non-Medical): No  Physical Activity: Inactive (12/31/2022)   Exercise Vital Sign    Days of Exercise per Week: 0 days    Minutes of Exercise per Session: 0 min  Stress: Stress Concern Present (12/31/2022)   [employer] of Occupational Health - Occupational Stress Questionnaire    Feeling of Stress : Very much  Social Connections: Socially Isolated (12/31/2022)   Social Connection and Isolation Panel [NHANES]    Frequency of Communication with Friends and Family: More than three times a week    Frequency of Social Gatherings with Friends and Family: Twice a week    Attends Religious Services: Never    Database administrator or Organizations: No    Attends Banker Meetings: Never    Marital Status: Divorced  Catering manager Violence: Not At Risk (12/31/2022)   Humiliation, Afraid, Rape, and Kick questionnaire    Fear of Current or Ex-Partner: No    Emotionally Abused: No    Physically Abused: No    Sexually Abused: No    Family History  Problem Relation Age of Onset   Diabetes type II Mother    Coronary artery disease Mother    Diabetes type II Father    Colon cancer Maternal  Grandfather    Lung cancer Maternal Grandfather    Prostate cancer Maternal Grandfather    Diabetes Maternal Grandfather    Hypertension Maternal Grandfather    Heart disease Paternal Grandfather    Parkinson's disease Paternal Aunt    Multiple sclerosis Paternal Aunt     Current Outpatient Medications  Medication Sig Dispense Refill   acetaminophen (TYLENOL) 500 MG tablet Take 1,000 mg by mouth every 6 (six) hours as needed for moderate pain.     amLODipine (NORVASC) 10 MG tablet TAKE ONE TABLET BY MOUTH ONCE DAILY (AM) 90 tablet 1   carvedilol (COREG) 25 MG tablet TAKE 1 TABLET BY MOUTH 2 (TWO) TIMES DAILY WITH A MEAL (AM+PM) 180 tablet 1   chlorthalidone (HYGROTON) 25 MG tablet Take 12.5 mg by mouth daily.  Continuous Glucose Sensor (DEXCOM G6 SENSOR) MISC USE DAILY AS DIRECTED. ONE SENSOR LAST FOR 10 DAYS. 3 each 3   Continuous Glucose Transmitter (DEXCOM G6 TRANSMITTER) MISC USE AS DIRECTED DAILY 1 each 0   famotidine (PEPCID) 20 MG tablet TAKE 1 TABLET (20 MG TOTAL) BY MOUTH AT BEDTIME. 90 tablet 3   FEROSUL 325 (65 Fe) MG tablet TAKE 1 TABLET (325 MG TOTAL) BY MOUTH DAILY WITH BREAKFAST. 100 tablet 1   gabapentin (NEURONTIN) 300 MG capsule Take 1 capsule (300 mg total) by mouth daily. 30 capsule 6   JARDIANCE 10 MG TABS tablet TAKE ONE TABLET BY MOUTH ON MONDAY,WEDNESDAY AND FRIDAY (AM). 12 tablet 2   pravastatin (PRAVACHOL) 20 MG tablet TAKE 1 TABLET (20 MG TOTAL) BY MOUTH DAILY. (BEDTIME) 90 tablet 1   sildenafil (VIAGRA) 100 MG tablet 1 tab PO 1/2 hr prior to intercourse PRN.  Limit use to 1 tab/24 hr 10 tablet 1   valsartan (DIOVAN) 40 MG tablet TAKE 1 TABLET (40 MG TOTAL) BY MOUTH DAILY. (AM) 90 tablet 1   No current facility-administered medications for this visit.    No Known Allergies   REVIEW OF SYSTEMS:   [X]  denotes positive finding, [ ]  denotes negative finding Cardiac  Comments:  Chest pain or chest pressure:    Shortness of breath upon exertion:    Short  of breath when lying flat:    Irregular heart rhythm:        Vascular    Pain in calf, thigh, or hip brought on by ambulation:    Pain in feet at night that wakes you up from your sleep:     Blood clot in your veins:    Leg swelling:         Pulmonary    Oxygen at home:    Productive cough:     Wheezing:         Neurologic    Sudden weakness in arms or legs:     Sudden numbness in arms or legs:     Sudden onset of difficulty speaking or slurred speech:    Temporary loss of vision in one eye:     Problems with dizziness:         Gastrointestinal    Blood in stool:     Vomited blood:         Genitourinary    Burning when urinating:     Blood in urine:        Psychiatric    Major depression:         Hematologic    Bleeding problems:    Problems with blood clotting too easily:        Skin    Rashes or ulcers:        Constitutional    Fever or chills:      PHYSICAL EXAMINATION:  Vitals:   01/23/23 1446  BP: 108/68  Pulse: 70  Temp: 98.1 F (36.7 C)  SpO2: 97%  Weight: 239 lb 4.8 oz (108.5 kg)  Height: 5\' 6"  (1.676 m)    General:  WDWN in NAD; vital signs documented above Gait: Not observed HENT: WNL, normocephalic Pulmonary: normal non-labored breathing , without Rales, rhonchi,  wheezing Cardiac: regular HR Abdomen: soft, NT, no masses Skin: without rashes Vascular Exam/Pulses: Radial signal by Doppler Extremities: without ischemic changes, without Gangrene , without cellulitis; without open wounds; right cephalic vein without thrill Musculoskeletal: no muscle wasting or atrophy  Neurologic: A&O X  3 Psychiatric:  The pt has Normal affect.   Non-Invasive Vascular Imaging:   Occluded right brachiocephalic fistula    ASSESSMENT/PLAN:: 37 y.o. male status post PD catheter removal as well as right brachiocephalic fistula creation by Dr. Randie Heinz on 12/21/2022  PD catheter removed in its entirety and all incisions of the abdomen are healed.  No signs of  peritonitis on exam.  Unfortunately the right brachiocephalic fistula has already thrombosed based on physical exam as well as duplex.  Given his right sided partial paralysis he would like to exhaust all options of the fistula in the right arm prior to moving to the left arm.  The basilic vein was spared during recent fistula creation.  He is not yet on hemodialysis.  Plan will be to proceed with right arm basilic vein fistula creation with the next available surgeon in the next week or 2.  Case was discussed in detail with the patient and is agreeable to proceed.  Patient's phone number was taken down today and he will be called by our surgery scheduler in the near future.   Emilie Rutter, PA-C [facility] [phone]  Clinic MD:   Randie Heinz

## 2023-01-24 ENCOUNTER — Other Ambulatory Visit: Payer: Self-pay

## 2023-01-24 DIAGNOSIS — N185 Chronic kidney disease, stage 5: Secondary | ICD-10-CM

## 2023-01-25 ENCOUNTER — Encounter (HOSPITAL_COMMUNITY): Payer: Self-pay | Admitting: Vascular Surgery

## 2023-01-25 ENCOUNTER — Other Ambulatory Visit: Payer: Self-pay

## 2023-01-25 NOTE — Progress Notes (Signed)
PCP - Dr Jonah Blue Cardiologist - Dr Kristeen Miss (F/U PRN)  Chest x-ray - 06/24/21 EKG - DOS Stress Test - 04/27/21 ECHO - 11/08/16 Cardiac Cath - n/a  ICD Pacemaker/Loop - n/a  Sleep Study -  n/a  Diabetes Type 2 Dexcom G6, Sensor on  right upper arm  No Jardiance on DOS. (Only takes it on M-W-F)  If your blood sugar is less than 70 mg/dL, you will need to treat for low blood sugar: Treat a low blood sugar (less than 70 mg/dL) with  cup of clear juice (cranberry or apple), 4 glucose tablets, OR glucose gel. Recheck blood sugar in 15 minutes after treatment (to make sure it is greater than 70 mg/dL). If your blood sugar is not greater than 70 mg/dL on recheck, call [phone] for further instructions.  NPO  Anesthesia review: Yes  STOP now taking any Aspirin (unless otherwise instructed by your surgeon), Aleve, Naproxen, Ibuprofen, Motrin, Advil, Goody's, BC's, all herbal medications, fish oil, and all vitamins.   Coronavirus Screening Do you have any of the following symptoms:  Cough yes/no: No Fever (>100.73F)  yes/no: No Runny nose yes/no: No Sore throat yes/no: No Difficulty breathing/shortness of breath  yes/no: No  Have you traveled in the last 14 days and where? yes/no: No  Patient verbalized understanding of instructions that were given via phone.

## 2023-01-28 NOTE — Anesthesia Preprocedure Evaluation (Addendum)
Anesthesia Evaluation  Patient identified by MRN, date of birth, ID band Patient awake    Reviewed: Allergy & Precautions, NPO status , Patient's Chart, lab work & pertinent test results  Airway Mallampati: II  TM Distance: >3 FB Neck ROM: Full    Dental no notable dental hx. (+) Teeth Intact, Dental Advisory Given   Pulmonary neg pulmonary ROS   Pulmonary exam normal breath sounds clear to auscultation       Cardiovascular hypertension, Pt. on medications Normal cardiovascular exam Rhythm:Regular Rate:Normal     Neuro/Psych    Depression Bipolar Disorder    Neuromuscular disease    GI/Hepatic ,GERD  ,,  Endo/Other  diabetes    Renal/GU ESRF and CRFRenal diseaseLab Results      Component                Value               Date                              K                        4.4                 12/21/2022                BUN                      50 (H)              12/21/2022                CREATININE               4.07 (H)            12/21/2022               ALBUMIN                  2.9 (L)             12/18/2022                GLUCOSE                  277 (H)             12/21/2022                Musculoskeletal   Abdominal   Peds  Hematology  (+) Blood dyscrasia, anemia Lab Results      Component                Value               Date                      WBC                      16.7 (H)            12/20/2022                HGB                      9.0 (L)  12/20/2022                HCT                      27.9 (L)            12/20/2022                MCV                      90.3                12/20/2022                PLT                      274                 12/20/2022              Anesthesia Other Findings   Reproductive/Obstetrics                             Anesthesia Physical Anesthesia Plan  ASA: 4  Anesthesia Plan: Regional   Post-op Pain  Management: Regional block* and Minimal or no pain anticipated   Induction:   PONV Risk Score and Plan: Propofol infusion, Treatment may vary due to age or medical condition and Ondansetron  Airway Management Planned: Natural Airway and Nasal Cannula  Additional Equipment: None  Intra-op Plan:   Post-operative Plan:   Informed Consent: I have reviewed the patients History and Physical, chart, labs and discussed the procedure including the risks, benefits and alternatives for the proposed anesthesia with the patient or authorized representative who has indicated his/her understanding and acceptance.     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  CKD is followed by Dr. Valentino Nose at [facility].  Patient recently had laparoscopic peritoneal dialysis catheter placed 11/29/2022.  He was subsequently hospitalized with cellulitis and peritonitis which necessitated removal of catheter.  He had right brachiocephalic AV fistula creation on 12/21/2022.  On follow-up with vascular surgery on 01/23/2023 the right brachiocephalic fistula was found to have been thrombosed. He is not yet on dialysis.  Recent anesthesia note 06/11/2022 is marked as difficult airway and GlideScope was used electively.  Per note, "Difficulty Due To: Difficulty was anticipated and Difficult Airway- due to reduced neck mobility Future Recommendations: Recommend- induction with short-acting  agent, and alternative techniques readily available Comments: Patient had posterior cervical fusion."  Patient will need day of surgery labs and evaluation.  EKG 04/18/2021: NSR.  Rate 98.  Nuclear stress 04/27/2021:   The study is normal. The study is low risk.   No ST deviation was noted.   Left ventricular function is normal. Nuclear stress EF: 56 %. The left ventricular ejection fraction is normal (55-65%). End diastolic cavity size is normal.   Prior study not available for comparison.   TTE 11/08/2016: -  Left ventricle: The cavity size was normal. Systolic function was  normal. The estimated ejection fraction was in the range of 60%  to 65%. Wall motion was normal; there were no regional wall  motion abnormalities.    )        Anesthesia Quick Evaluation

## 2023-01-28 NOTE — Progress Notes (Signed)
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   CKD is followed by Dr. Valentino Nose at [facility].  Patient recently had laparoscopic peritoneal dialysis catheter placed 11/29/2022.  He was subsequently hospitalized with cellulitis and peritonitis which necessitated removal of catheter.  He had right brachiocephalic AV fistula creation on 12/21/2022.  On follow-up with vascular surgery on 01/23/2023 the right brachiocephalic fistula was found to have been thrombosed. He is not yet on dialysis.   Recent anesthesia note 06/11/2022 is marked as difficult airway and GlideScope was used electively.  Per note, "Difficulty Due To: Difficulty was anticipated and Difficult Airway- due to reduced neck mobility Future Recommendations: Recommend- induction with short-acting agent, and alternative techniques readily available Comments: Patient had posterior cervical fusion."   Patient will need day of surgery labs and evaluation.   EKG 04/18/2021: NSR.  Rate 98.   Nuclear stress 04/27/2021:   The study is normal. The study is low risk.   No ST deviation was noted.   Left ventricular function is normal. Nuclear stress EF: 56 %. The left ventricular ejection fraction is normal (55-65%). End diastolic cavity size is normal.   Prior study not available for comparison.     TTE 11/08/2016: - Left ventricle: The cavity size was normal. Systolic function was    normal. The estimated ejection fraction was in the range of 60%    to 65%. Wall motion was normal; there were no regional wall     motion abnormalities.      Jye, [facility] Short Stay Center/Anesthesiology Phone [phone] 01/28/2023 12:06 PM

## 2023-01-28 NOTE — Progress Notes (Signed)
Patient was called to be informed that the surgery time for tomorrow was changed to 09:02 o'clock. Patient was instructed to be at the hospital at 06:30 o'clock. Patient verbalized understanding.

## 2023-01-29 ENCOUNTER — Other Ambulatory Visit: Payer: Self-pay

## 2023-01-29 ENCOUNTER — Encounter (HOSPITAL_COMMUNITY)
Admission: RE | Disposition: A | Discharge status: Home / Self Care | Payer: Self-pay | Source: Home / Self Care | Attending: Vascular Surgery

## 2023-01-29 ENCOUNTER — Encounter (HOSPITAL_COMMUNITY): Payer: Self-pay | Admitting: Vascular Surgery

## 2023-01-29 ENCOUNTER — Ambulatory Visit (HOSPITAL_BASED_OUTPATIENT_CLINIC_OR_DEPARTMENT_OTHER): Payer: Medicaid Other | Admitting: Physician Assistant

## 2023-01-29 ENCOUNTER — Ambulatory Visit (HOSPITAL_COMMUNITY)
Admission: RE | Admit: 2023-01-29 | Discharge: 2023-01-29 | Disposition: A | Discharge status: Home / Self Care | Payer: Medicaid Other | Attending: Vascular Surgery | Admitting: Vascular Surgery

## 2023-01-29 ENCOUNTER — Ambulatory Visit (HOSPITAL_COMMUNITY): Payer: Medicaid Other | Admitting: Physician Assistant

## 2023-01-29 DIAGNOSIS — Z5986 Financial insecurity: Secondary | ICD-10-CM | POA: Diagnosis not present

## 2023-01-29 DIAGNOSIS — K3184 Gastroparesis: Secondary | ICD-10-CM | POA: Insufficient documentation

## 2023-01-29 DIAGNOSIS — Z89421 Acquired absence of other right toe(s): Secondary | ICD-10-CM | POA: Insufficient documentation

## 2023-01-29 DIAGNOSIS — E785 Hyperlipidemia, unspecified: Secondary | ICD-10-CM | POA: Diagnosis not present

## 2023-01-29 DIAGNOSIS — E1143 Type 2 diabetes mellitus with diabetic autonomic (poly)neuropathy: Secondary | ICD-10-CM | POA: Insufficient documentation

## 2023-01-29 DIAGNOSIS — N184 Chronic kidney disease, stage 4 (severe): Secondary | ICD-10-CM

## 2023-01-29 DIAGNOSIS — F319 Bipolar disorder, unspecified: Secondary | ICD-10-CM | POA: Insufficient documentation

## 2023-01-29 DIAGNOSIS — T82868A Thrombosis of vascular prosthetic devices, implants and grafts, initial encounter: Secondary | ICD-10-CM

## 2023-01-29 DIAGNOSIS — N186 End stage renal disease: Secondary | ICD-10-CM | POA: Diagnosis present

## 2023-01-29 DIAGNOSIS — E1122 Type 2 diabetes mellitus with diabetic chronic kidney disease: Secondary | ICD-10-CM | POA: Diagnosis not present

## 2023-01-29 DIAGNOSIS — I120 Hypertensive chronic kidney disease with stage 5 chronic kidney disease or end stage renal disease: Secondary | ICD-10-CM | POA: Insufficient documentation

## 2023-01-29 DIAGNOSIS — Z9889 Other specified postprocedural states: Secondary | ICD-10-CM | POA: Diagnosis not present

## 2023-01-29 DIAGNOSIS — N185 Chronic kidney disease, stage 5: Secondary | ICD-10-CM

## 2023-01-29 DIAGNOSIS — I129 Hypertensive chronic kidney disease with stage 1 through stage 4 chronic kidney disease, or unspecified chronic kidney disease: Secondary | ICD-10-CM

## 2023-01-29 HISTORY — DX: Paralytic syndrome, unspecified: G83.9

## 2023-01-29 HISTORY — DX: Gastro-esophageal reflux disease without esophagitis: K21.9

## 2023-01-29 HISTORY — PX: AV FISTULA PLACEMENT: SHX1204

## 2023-01-29 HISTORY — DX: Myoneural disorder, unspecified: G70.9

## 2023-01-29 LAB — POCT I-STAT, CHEM 8
BUN: 59 mg/dL — ABNORMAL HIGH (ref 6–20)
Calcium, Ion: 1.21 mmol/L (ref 1.15–1.40)
Chloride: 109 mmol/L (ref 98–111)
Creatinine, Ser: 3.9 mg/dL — ABNORMAL HIGH (ref 0.61–1.24)
Glucose, Bld: 169 mg/dL — ABNORMAL HIGH (ref 70–99)
HCT: 33 % — ABNORMAL LOW (ref 39.0–52.0)
Hemoglobin: 11.2 g/dL — ABNORMAL LOW (ref 13.0–17.0)
Potassium: 4.3 mmol/L (ref 3.5–5.1)
Sodium: 141 mmol/L (ref 135–145)
TCO2: 21 mmol/L — ABNORMAL LOW (ref 22–32)

## 2023-01-29 LAB — GLUCOSE, CAPILLARY
Glucose-Capillary: 169 mg/dL — ABNORMAL HIGH (ref 70–99)
Glucose-Capillary: 159 mg/dL — ABNORMAL HIGH (ref 70–99)

## 2023-01-29 SURGERY — ARTERIOVENOUS (AV) FISTULA CREATION
Anesthesia: Regional | Site: Arm Upper | Laterality: Right

## 2023-01-29 MED ORDER — OXYCODONE-ACETAMINOPHEN 5-325 MG PO TABS: 1.0000 | ORAL_TABLET | Freq: Four times a day (QID) | ORAL | 0 refills | Status: DC | PRN

## 2023-01-29 MED ORDER — PROPOFOL 500 MG/50ML IV EMUL
INTRAVENOUS | Status: DC | PRN
  Administered 2023-01-29: 75 ug/kg/min via INTRAVENOUS

## 2023-01-29 MED ORDER — MIDAZOLAM HCL 2 MG/2ML IJ SOLN
INTRAMUSCULAR | Status: AC
  Administered 2023-01-29: 2 mg via INTRAVENOUS
  Filled 2023-01-29: qty 2

## 2023-01-29 MED ORDER — ONDANSETRON HCL 4 MG/2ML IJ SOLN: 4.0000 mg | Freq: Once | INTRAMUSCULAR | Status: DC | PRN

## 2023-01-29 MED ORDER — HEPARIN 6000 UNIT IRRIGATION SOLUTION
Status: AC
  Filled 2023-01-29: qty 500

## 2023-01-29 MED ORDER — HEPARIN 6000 UNIT IRRIGATION SOLUTION
Status: DC | PRN
  Administered 2023-01-29: 1

## 2023-01-29 MED ORDER — CHLORHEXIDINE GLUCONATE 4 % EX SOLN: 60.0000 mL | Freq: Once | CUTANEOUS | Status: DC

## 2023-01-29 MED ORDER — ORAL CARE MOUTH RINSE: 15.0000 mL | Freq: Once | OROMUCOSAL | Status: AC

## 2023-01-29 MED ORDER — SODIUM CHLORIDE 0.9 % IV SOLN: INTRAVENOUS | Status: DC | PRN

## 2023-01-29 MED ORDER — CHLORHEXIDINE GLUCONATE 0.12 % MT SOLN
15.0000 mL | Freq: Once | OROMUCOSAL | Status: AC
  Administered 2023-01-29: 15 mL via OROMUCOSAL
  Filled 2023-01-29: qty 15

## 2023-01-29 MED ORDER — STERILE WATER FOR IRRIGATION IR SOLN
Status: DC | PRN
  Administered 2023-01-29: 1000 mL

## 2023-01-29 MED ORDER — 0.9 % SODIUM CHLORIDE (POUR BTL) OPTIME
TOPICAL | Status: DC | PRN
  Administered 2023-01-29: 1000 mL

## 2023-01-29 MED ORDER — FENTANYL CITRATE (PF) 100 MCG/2ML IJ SOLN
INTRAMUSCULAR | Status: AC
  Administered 2023-01-29: 50 ug via INTRAVENOUS
  Filled 2023-01-29: qty 2

## 2023-01-29 MED ORDER — FENTANYL CITRATE (PF) 100 MCG/2ML IJ SOLN: 50.0000 ug | Freq: Once | INTRAMUSCULAR | Status: AC

## 2023-01-29 MED ORDER — LIDOCAINE-EPINEPHRINE (PF) 1.5 %-1:200000 IJ SOLN
INTRAMUSCULAR | Status: DC | PRN
  Administered 2023-01-29: 12 mL via PERINEURAL

## 2023-01-29 MED ORDER — CARVEDILOL 12.5 MG PO TABS
25.0000 mg | ORAL_TABLET | Freq: Once | ORAL | Status: AC
  Administered 2023-01-29: 25 mg via ORAL
  Filled 2023-01-29: qty 2

## 2023-01-29 MED ORDER — FENTANYL CITRATE (PF) 100 MCG/2ML IJ SOLN: 25.0000 ug | INTRAMUSCULAR | Status: DC | PRN

## 2023-01-29 MED ORDER — CEFAZOLIN SODIUM-DEXTROSE 2-4 GM/100ML-% IV SOLN
2.0000 g | INTRAVENOUS | Status: AC
  Administered 2023-01-29: 2 g via INTRAVENOUS
  Filled 2023-01-29: qty 100

## 2023-01-29 MED ORDER — BUPIVACAINE HCL (PF) 0.5 % IJ SOLN
INTRAMUSCULAR | Status: DC | PRN
  Administered 2023-01-29: 12 mL via PERINEURAL

## 2023-01-29 MED ORDER — ACETAMINOPHEN 10 MG/ML IV SOLN
1000.0000 mg | Freq: Once | INTRAVENOUS | Status: DC | PRN
  Administered 2023-01-29: 1000 mg via INTRAVENOUS

## 2023-01-29 MED ORDER — ACETAMINOPHEN 10 MG/ML IV SOLN
INTRAVENOUS | Status: AC
  Filled 2023-01-29: qty 100

## 2023-01-29 MED ORDER — MIDAZOLAM HCL 2 MG/2ML IJ SOLN: 2.0000 mg | Freq: Once | INTRAMUSCULAR | Status: AC

## 2023-01-29 SURGICAL SUPPLY — 29 items
ADH SKN CLS APL DERMABOND .7 (GAUZE/BANDAGES/DRESSINGS) ×1
ARMBAND PINK RESTRICT EXTREMIT (MISCELLANEOUS) ×1 IMPLANT
BAG COUNTER SPONGE SURGICOUNT (BAG) ×1 IMPLANT
BAG SPNG CNTER NS LX DISP (BAG) ×1
CANISTER SUCT 3000ML PPV (MISCELLANEOUS) ×1 IMPLANT
CLIP LIGATING EXTRA MED SLVR (CLIP) ×1 IMPLANT
CLIP LIGATING EXTRA SM BLUE (MISCELLANEOUS) ×1 IMPLANT
COVER PROBE W GEL 5X96 (DRAPES) IMPLANT
DERMABOND ADVANCED .7 DNX12 (GAUZE/BANDAGES/DRESSINGS) ×1 IMPLANT
ELECT REM PT RETURN 9FT ADLT (ELECTROSURGICAL) ×1
ELECTRODE REM PT RTRN 9FT ADLT (ELECTROSURGICAL) ×1 IMPLANT
GLOVE BIO SURGEON STRL SZ7.5 (GLOVE) ×1 IMPLANT
GOWN STRL REUS W/ TWL LRG LVL3 (GOWN DISPOSABLE) ×2 IMPLANT
GOWN STRL REUS W/ TWL XL LVL3 (GOWN DISPOSABLE) ×1 IMPLANT
GOWN STRL REUS W/TWL LRG LVL3 (GOWN DISPOSABLE) ×2
GOWN STRL REUS W/TWL XL LVL3 (GOWN DISPOSABLE) ×1
KIT BASIN OR (CUSTOM PROCEDURE TRAY) ×1 IMPLANT
KIT TURNOVER KIT B (KITS) ×1 IMPLANT
NS IRRIG 1000ML POUR BTL (IV SOLUTION) ×1 IMPLANT
PACK CV ACCESS (CUSTOM PROCEDURE TRAY) ×1 IMPLANT
PAD ARMBOARD 7.5X6 YLW CONV (MISCELLANEOUS) ×2 IMPLANT
SLING ARM FOAM STRAP LRG (SOFTGOODS) IMPLANT
SUT MNCRL AB 4-0 PS2 18 (SUTURE) ×1 IMPLANT
SUT PROLENE 6 0 BV (SUTURE) ×1 IMPLANT
SUT VIC AB 3-0 SH 27 (SUTURE) ×1
SUT VIC AB 3-0 SH 27X BRD (SUTURE) ×1 IMPLANT
TOWEL GREEN STERILE (TOWEL DISPOSABLE) ×1 IMPLANT
UNDERPAD 30X36 HEAVY ABSORB (UNDERPADS AND DIAPERS) ×1 IMPLANT
WATER STERILE IRR 1000ML POUR (IV SOLUTION) ×1 IMPLANT

## 2023-01-29 NOTE — Op Note (Signed)
    Patient name: [name] [name] MRN: [id number] DOB: 1986/02/11 Sex: male  01/29/2023 Pre-operative Diagnosis: CKD 4 Post-operative diagnosis:  Same Surgeon:  Apolinar Junes C. Randie Heinz, MD Assistant: Nathanial Rancher, PA Procedure Performed: Right brachial artery to basilic vein AV fistula creation  Indications: 37 year old male with chronic kidney disease has undergone cephalic vein fistula creation that has thrombosed.  At the time of his index procedure he was noted to have a suitable basilic vein for further fistula creation.  Patient desires to keep access in the right upper extremity given his history of weakness and we are now planning for right arm basilic vein fistula creation versus AV graft.  Given the complexity of the case,  the assistant was necessary in order to expedite the procedure and safely perform the technical aspects of the operation.  The assistant provided traction and countertraction to assist with exposure of the artery and vein.  They also assisted with suture ligation of multiple venous branches.  They played a critical role in the anastomosis. These skills, especially following the Prolene suture for the anastomosis, could not have been adequately performed by a scrub tech assistant.    Findings: There was a large basilic vein above the antecubitum which was anastomosed to healthy brachial artery at completion was a very strong thrill confirmed with Doppler and a weakly palpable radial artery pulse also confirmed with Doppler that augmented with compression of the fistula.   Procedure:  The patient was identified in the holding area and taken to the operating room where he was placed supine operative table and MAC anesthesia was induced.  He was sterilely prepped and draped in the right upper extremity in the usual fashion, antibiotics were administered and a timeout was called.  A preoperative block of been placed and this was tested and noted to be intact.  Ultrasound was used to  identify a very large basilic vein above the antecubitum and a transverse incision was created between that and the brachial artery also identified by ultrasound.  We dissected out the vein sparing the nerve branches around it and divided branches between ties and marked the vein for orientation.  We dissected through the deep fascia identified the brachial artery and encircled this with vessel loop.  The vein was then clamped distally and transected and tied off and flushed with heparinized saline and spatulated and reclamped.  The artery was clamped distally and proximally opened longitudinally flushed with heparinized saline distal direction.  The vein was then sewn into side with 6-0 Prolene suture.  Prior to completion we allowed flushing in all directions.  Upon completion there was a very strong thrill within the vein confirmed with Doppler and a weakly palpable radial artery pulse at the wrist also confirmed with Doppler.  Satisfied with this we irrigated the wound and obtained hemostasis and closed in layers of Vicryl and Monocryl.  The patient was awakened from anesthesia having tolerated the procedure without immediate complication.  All counts were correct at completion.  EBL: 20 cc     Tierra Thoma C. Randie Heinz, MD [facility] [place] Office: [phone] Pager: [phone]

## 2023-01-29 NOTE — Anesthesia Postprocedure Evaluation (Signed)
Anesthesia Post Note  Patient: [name] [name]  Procedure(s) Performed: RIGHT ARM BASILIC VEIN FISTULA CREATION (Right: Arm Upper)     Patient location during evaluation: PACU Anesthesia Type: Regional and MAC Level of consciousness: awake and alert Pain management: pain level controlled Vital Signs Assessment: post-procedure vital signs reviewed and stable Respiratory status: spontaneous breathing, nonlabored ventilation, respiratory function stable and patient connected to nasal cannula oxygen Cardiovascular status: stable and blood pressure returned to baseline Postop Assessment: no apparent nausea or vomiting Anesthetic complications: no   There were no known notable events for this encounter.  Last Vitals:  Vitals:   01/29/23 1045 01/29/23 1100  BP: (!) 150/83 (!) 142/76  Pulse: 69 68  Resp: (!) 24 20  Temp:  36.5 C  SpO2: 96% 97%    Last Pain:  Vitals:   01/29/23 1100  TempSrc:   PainSc: Asleep                 Trevor Iha

## 2023-01-29 NOTE — Interval H&P Note (Signed)
History and Physical Interval Note:  01/29/2023 7:32 AM  [name] [name]  has presented today for surgery, with the diagnosis of Chronic kidney disease, stage V.  The various methods of treatment have been discussed with the patient and family. After consideration of risks, benefits and other options for treatment, the patient has consented to  Procedure(s): RIGHT ARM BASILIC VEIN FISTULA CREATION (Right) as a surgical intervention.  The patient's history has been reviewed, patient examined, no change in status, stable for surgery.  I have reviewed the patient's chart and labs.  Questions were answered to the patient's satisfaction.     Lemar Livings

## 2023-01-29 NOTE — Anesthesia Procedure Notes (Addendum)
Anesthesia Regional Block: Supraclavicular block   Pre-Anesthetic Checklist: , timeout performed,  Correct Patient, Correct Site, Correct Laterality,  Correct Procedure, Correct Position, site marked,  Risks and benefits discussed,  Surgical consent,  Pre-op evaluation,  At surgeon's request and post-op pain management  Laterality: Upper and Right  Prep: chloraprep       Needles:  Injection technique: Single-shot  Needle Type: Echogenic Needle     Needle Length: 5cm  Needle Gauge: 21     Additional Needles:   Procedures:,,,, ultrasound used (permanent image in chart),,     Nerve Stimulator or Paresthesia:   Additional Responses:  Block tested.  Patient tolerated procedure well Narrative:  Start time: 01/29/2023 8:37 AM End time: 01/29/2023 8:43 AM Injection made incrementally with aspirations every 5 mL.  Performed by: Personally  Anesthesiologist: Trevor Iha, MD  Additional Notes: Block tested. Patient tolerated procedure well.

## 2023-01-29 NOTE — Transfer of Care (Signed)
Immediate Anesthesia Transfer of Care Note  Patient: [name] [name]  Procedure(s) Performed: RIGHT ARM BASILIC VEIN FISTULA CREATION (Right: Arm Upper)  Patient Location: PACU  Anesthesia Type:MAC combined with regional for post-op pain  Level of Consciousness: awake, alert , and oriented  Airway & Oxygen Therapy: Patient Spontanous Breathing  Post-op Assessment: Report given to RN and Post -op Vital signs reviewed and stable  Post vital signs: Reviewed and stable  Last Vitals:  Vitals Value Taken Time  BP 136/85 01/29/23 1031  Temp    Pulse 73 01/29/23 1035  Resp 20 01/29/23 1035  SpO2 98 % 01/29/23 1035  Vitals shown include unfiled device data.  Last Pain:  Vitals:   01/29/23 0714  TempSrc:   PainSc: 0-No pain      Patients Stated Pain Goal: 0 (01/29/23 7846)  Complications: There were no known notable events for this encounter.

## 2023-01-30 ENCOUNTER — Encounter (HOSPITAL_COMMUNITY): Payer: Self-pay | Admitting: Vascular Surgery

## 2023-02-03 NOTE — Progress Notes (Unsigned)
S:     No chief complaint on file.  37 y.o. male who presents for diabetes evaluation, education, and management. 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 Patient was referred and last seen by Primary Care Provider, Dr. Laural Benes, on 12/31/2022. At that visit patient reported his blood sugars stay between 120-140. Patient told PCP that he was told that the Jardiance dose may need to be decreased. However, PCP reviewed the last note from the nephrologist when he was seen last month. It said to continue the [place].   Patient arrives in good spirits and presents without any assistance. Patient does not have his Dexcom G6 data with him today. Patient mentions he forgot to bring it with him today. Patient does not have his sensor on his arm today he mentions his arm was irritated from it so he is giving his arm a break. Patient mentions his blood sugars at home have been running around 120-160s. Patient mentions he eats 1 meal per day since that's the most he can afford with his food stamp benefit and endorses dizziness, lightheadedness and shakiness. Endorses nocturia. Patient states he will be going to his nephrology appointment after this visit. He also mentions he will be moving back to [place] in February of 2025.   Family/Social History: -DM, CAD, Cancer, HTN  Current diabetes medications include: Jardiance 10 mg on MWF Current hypertension medications include: Amlodipine 10 mg daily, Carvedilol 25 mg BID, Chlorthalidone 12.5 mg daily, Valsartan 40 mg daily Current hyperlipidemia medications include: Pravastatin 20 mg daily  Patient reports adherence to taking all medications as prescribed.   Insurance coverage: [place] Medicaid  Patient reports hypoglycemic events.  Reported home blood sugars: 120-160s  Patient reports  nocturia (nighttime urination).  Patient denies neuropathy (nerve pain). Patient denies visual changes. Patient self foot exams- not discussed today  Patient reported dietary habits:  -Eats 1 meal/day -Mentions he can only afford to eat once a day with his food stamp benefits. Dinner: Museum/gallery exhibitions officer, Secretary/administrator Snacks: fruits Drinks: water, energy drink, soda   Patient-reported exercise habits: not discussed this visit  O:  7 day average blood glucose: CGM data not present   Lab Results  Component Value Date   HGBA1C 6.5 (H) 12/18/2022   There were no vitals filed for this visit.  Lipid Panel     Component Value Date/Time   CHOL 149 03/21/2021 1536   TRIG 104 03/21/2021 1536   HDL 37 (L) 03/21/2021 1536   CHOLHDL 4.0 03/21/2021 1536   CHOLHDL 4.6 12/06/2019 0749   VLDL 23 12/06/2019 0749   LDLCALC 93 03/21/2021 1536    Clinical Atherosclerotic Cardiovascular Disease (ASCVD): No  The ASCVD Risk score (Arnett DK, et al., 2019) failed to calculate for the following reasons:   The 2019 ASCVD risk score is only valid for ages 62 to 3    A/P: Diabetes longstanding currently controlled based on A1c of 6.5%. Patient is able to verbalize appropriate hypoglycemia management plan. Medication adherence appears optimal.  -Continued Jardiance 10 mg MWF -Extensively discussed pathophysiology of diabetes, recommended lifestyle interventions, dietary effects on blood sugar control.  -Counseled on s/sx of and management of hypoglycemia.  -Next A1c anticipated 03/2022.   Written patient instructions provided. Patient verbalized understanding of treatment plan.  Total time in face to face counseling 30 minutes.  Follow-up:  Pharmacist: not scheduled PCP clinic visit: 04/02/2023  Patient seen with: Erasmo Leventhal, PharmD Candidate  [employer] of 2025 [employer] [facility] [facility] SOP   Butch Penny, PharmD, [place], CPP Clinical Pharmacist [facility] [phone]

## 2023-02-04 ENCOUNTER — Encounter: Payer: Self-pay | Admitting: Pharmacist

## 2023-02-04 ENCOUNTER — Ambulatory Visit: Payer: Medicaid Other | Attending: Family Medicine | Admitting: Pharmacist

## 2023-02-04 DIAGNOSIS — E1142 Type 2 diabetes mellitus with diabetic polyneuropathy: Secondary | ICD-10-CM | POA: Diagnosis not present

## 2023-02-04 DIAGNOSIS — Z7984 Long term (current) use of oral hypoglycemic drugs: Secondary | ICD-10-CM

## 2023-02-11 ENCOUNTER — Other Ambulatory Visit: Payer: Self-pay | Admitting: *Deleted

## 2023-02-11 DIAGNOSIS — N185 Chronic kidney disease, stage 5: Secondary | ICD-10-CM

## 2023-02-18 ENCOUNTER — Other Ambulatory Visit (HOSPITAL_COMMUNITY): Payer: Self-pay | Admitting: *Deleted

## 2023-02-19 ENCOUNTER — Ambulatory Visit (HOSPITAL_COMMUNITY)
Admission: RE | Admit: 2023-02-19 | Discharge: 2023-02-19 | Disposition: A | Discharge status: Home / Self Care | Payer: Medicaid Other | Source: Ambulatory Visit | Attending: Internal Medicine | Admitting: Internal Medicine

## 2023-02-19 DIAGNOSIS — D631 Anemia in chronic kidney disease: Secondary | ICD-10-CM | POA: Diagnosis not present

## 2023-02-19 DIAGNOSIS — N189 Chronic kidney disease, unspecified: Secondary | ICD-10-CM | POA: Diagnosis present

## 2023-02-19 MED ORDER — SODIUM CHLORIDE 0.9 % IV SOLN
510.0000 mg | INTRAVENOUS | Status: DC
  Administered 2023-02-19: 510 mg via INTRAVENOUS
  Filled 2023-02-19: qty 510

## 2023-02-22 ENCOUNTER — Other Ambulatory Visit: Payer: Self-pay

## 2023-02-22 ENCOUNTER — Other Ambulatory Visit: Payer: Self-pay | Admitting: Internal Medicine

## 2023-02-22 DIAGNOSIS — E1169 Type 2 diabetes mellitus with other specified complication: Secondary | ICD-10-CM

## 2023-02-26 ENCOUNTER — Ambulatory Visit (HOSPITAL_COMMUNITY)
Admission: RE | Admit: 2023-02-26 | Discharge: 2023-02-26 | Disposition: A | Discharge status: Home / Self Care | Payer: Medicaid Other | Source: Ambulatory Visit | Attending: Internal Medicine | Admitting: Internal Medicine

## 2023-02-26 DIAGNOSIS — N189 Chronic kidney disease, unspecified: Secondary | ICD-10-CM | POA: Diagnosis not present

## 2023-02-26 MED ORDER — SODIUM CHLORIDE 0.9 % IV SOLN
510.0000 mg | INTRAVENOUS | Status: DC
  Administered 2023-02-26: 510 mg via INTRAVENOUS
  Filled 2023-02-26: qty 510

## 2023-02-27 ENCOUNTER — Ambulatory Visit (HOSPITAL_COMMUNITY)
Admission: RE | Admit: 2023-02-27 | Discharge: 2023-02-27 | Disposition: A | Discharge status: Home / Self Care | Payer: Medicaid Other | Source: Ambulatory Visit | Attending: Vascular Surgery | Admitting: Vascular Surgery

## 2023-02-27 DIAGNOSIS — N185 Chronic kidney disease, stage 5: Secondary | ICD-10-CM | POA: Insufficient documentation

## 2023-03-06 ENCOUNTER — Ambulatory Visit (INDEPENDENT_AMBULATORY_CARE_PROVIDER_SITE_OTHER): Payer: Medicaid Other | Admitting: Physician Assistant

## 2023-03-06 VITALS — BP 152/90 | HR 72 | Temp 97.2°F | Resp 20 | Ht 67.0 in | Wt 244.8 lb

## 2023-03-06 DIAGNOSIS — N185 Chronic kidney disease, stage 5: Secondary | ICD-10-CM

## 2023-03-06 NOTE — Progress Notes (Signed)
POST OPERATIVE OFFICE NOTE    CC:  F/u for surgery  HPI:  This is a 37 y.o. male who is s/p first stage right UE basilic fistula creation on 01/29/23 by Dr. Randie Heinz.    Pt returns today for follow up.  Pt states he is not currently on HD , no anticoagulation therapy.  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  He denies changes in the right arm post surgery.     No Known Allergies  Current Outpatient Medications  Medication Sig Dispense Refill   acetaminophen (TYLENOL) 500 MG tablet Take 1,000 mg by mouth every 6 (six) hours as needed for moderate pain.     amLODipine (NORVASC) 10 MG tablet TAKE ONE TABLET BY MOUTH ONCE DAILY (AM) 90 tablet 1   carvedilol (COREG) 25 MG tablet TAKE 1 TABLET BY MOUTH 2 (TWO) TIMES DAILY WITH A MEAL (AM+PM) 180 tablet 1   chlorthalidone (HYGROTON) 25 MG tablet Take 12.5 mg by mouth daily.     Continuous Glucose Sensor (DEXCOM G6 SENSOR) MISC USE DAILY AS DIRECTED. ONE SENSOR LAST FOR 10 DAYS. 3 each 3   Continuous Glucose Transmitter (DEXCOM G6 TRANSMITTER) MISC USE AS DIRECTED DAILY 1 each 0   famotidine (PEPCID) 20 MG tablet TAKE 1 TABLET (20 MG TOTAL) BY MOUTH AT BEDTIME. 90 tablet 3   FEROSUL 325 (65 Fe) MG tablet TAKE 1 TABLET (325 MG TOTAL) BY MOUTH DAILY WITH BREAKFAST. 100 tablet 1   gabapentin (NEURONTIN) 300 MG capsule Take 1 capsule (300 mg total) by mouth daily. 30 capsule 6   JARDIANCE 10 MG TABS tablet TAKE ONE TABLET BY MOUTH ON MONDAY,WEDNESDAY AND FRIDAY (AM). 12 tablet 2   oxyCODONE-acetaminophen (PERCOCET) 5-325 MG tablet Take 1 tablet by mouth every 6 (six) hours as needed for severe pain (pain score 7-10). 10 tablet 0   pravastatin (PRAVACHOL) 20 MG tablet TAKE 1 TABLET (20 MG TOTAL) BY MOUTH DAILY. (BEDTIME) 90 tablet 1   sildenafil (VIAGRA) 100 MG tablet 1 tab PO 1/2 hr prior to intercourse PRN.  Limit use to 1 tab/24 hr 10 tablet 1   valsartan (DIOVAN) 40 MG tablet TAKE 1 TABLET (40 MG TOTAL) BY  MOUTH DAILY. (AM) 90 tablet 1   No current facility-administered medications for this visit.     ROS:  See HPI  Physical Exam:    Incision:  well healed Extremities:  palpable thrill, palpable radial pulse Neuro: decreased motor and sensation in the right UE prior to surgery     Findings:  +--------------------+----------+-----------------+--------+  AVF                PSV (cm/s)Flow Vol (mL/min)Comments  +--------------------+----------+-----------------+--------+  Native artery inflow   245           700                 +--------------------+----------+-----------------+--------+  AVF Anastomosis        794                               +--------------------+----------+-----------------+--------+     +------------+----------+-------------+----------+--------+  OUTFLOW VEINPSV (cm/s)Diameter (cm)Depth (cm)Describe  +------------+----------+-------------+----------+--------+  Prox UA        129        0.62        2.75             +------------+----------+-------------+----------+--------+  Mid  UA         196        0.73        1.82             +------------+----------+-------------+----------+--------+  Dist UA        180        0.74        1.38             +------------+----------+-------------+----------+--------+  AC Fossa     >875cm/s     0.34        1.32             +------------+----------+-------------+----------+--------+     Assessment/Plan:  This is a 37 y.o. male who is s/p:first stage basilic av fistula creation.  He will now be scheduled for second stage basilic transposition.  He is on t on HD and not on anticoagulation.  He agrees to proceed with this plan.     -plan for second stage basilic transposition with DR. Ricki Miller PA-C [facility] [phone]   Clinic MD:  Randie Heinz

## 2023-03-07 LAB — LAB REPORT - SCANNED: EGFR: 17

## 2023-03-12 ENCOUNTER — Telehealth: Payer: Self-pay

## 2023-03-12 NOTE — Telephone Encounter (Signed)
Attempted to reach patient to schedule RUE second stage BVT- no answer. Left VM for patient to return call.

## 2023-03-21 ENCOUNTER — Other Ambulatory Visit: Payer: Self-pay | Admitting: Internal Medicine

## 2023-03-21 DIAGNOSIS — E1142 Type 2 diabetes mellitus with diabetic polyneuropathy: Secondary | ICD-10-CM

## 2023-03-21 DIAGNOSIS — E1159 Type 2 diabetes mellitus with other circulatory complications: Secondary | ICD-10-CM

## 2023-03-22 ENCOUNTER — Telehealth: Payer: Self-pay

## 2023-03-22 ENCOUNTER — Other Ambulatory Visit: Payer: Self-pay

## 2023-03-22 NOTE — Telephone Encounter (Signed)
 eGFR of 17. PCP will need to review regimen

## 2023-03-22 NOTE — Telephone Encounter (Signed)
 Pharmacy Patient Advocate Encounter   Received notification from CoverMyMeds that prior authorization for DEXCOM G6 SENSOR is required/requested.   Insurance verification completed.   The patient is insured through [facility] [place] .   Per test claim: PA required; PA submitted to above mentioned insurance via CoverMyMeds Key/confirmation #/EOC [id number] Status is pending

## 2023-03-22 NOTE — Telephone Encounter (Signed)
 Pharmacy Patient Advocate Encounter  Received notification from [facility] Medicaid that Prior Authorization for [facility] G6 SENSOR has been APPROVED from 03/22/2023 to 03/21/2024   PA #/Case ID/Reference #: [id number]  [facility] HAS BEEN NOTIFIED AND PROCESSED PRESCRIPTION. MYCHART MESSAGE SENT TO PATIENT.

## 2023-03-23 ENCOUNTER — Telehealth: Payer: Self-pay | Admitting: Internal Medicine

## 2023-03-23 NOTE — Telephone Encounter (Signed)
 Please call [facility] and ask that a message be sent to his nephrologist to inquire whether pt is to remain on Jardiance given GFR of 17.

## 2023-03-25 NOTE — Telephone Encounter (Signed)
 Called [facility] and was transferred to the patient's nephrologist assistant line. LVM inquiring about patient Jardiance. Awaiting call back.

## 2023-03-26 NOTE — Telephone Encounter (Signed)
 Kellie from [facility]  Best contact: [phone] ext [phone]  "Dr. Melanee Spry states that he would continue Jardiance because some studies show potential benefit while on dialysis. GFR is 17"

## 2023-03-27 ENCOUNTER — Ambulatory Visit (INDEPENDENT_AMBULATORY_CARE_PROVIDER_SITE_OTHER): Payer: Medicaid Other | Admitting: Mental Health

## 2023-03-27 DIAGNOSIS — F332 Major depressive disorder, recurrent severe without psychotic features: Secondary | ICD-10-CM

## 2023-03-27 DIAGNOSIS — F1290 Cannabis use, unspecified, uncomplicated: Secondary | ICD-10-CM

## 2023-03-27 MED ORDER — EMPAGLIFLOZIN 10 MG PO TABS: ORAL_TABLET | ORAL | 6 refills | Status: AC

## 2023-03-27 NOTE — Progress Notes (Signed)
   THERAPIST PROGRESS NOTE  Session Time: 3:06 pm ( 47 minutes)  Participation Level: Active  Behavioral Response: CasualAlertEuthymic  Type of Therapy: Individual Therapy  Treatment Goals addressed:  STG: Dealing with stress. Arvil will increase management of low mood and stressors AEB development of x 3 effective distress tolerance and emotional regulation coping skills within the next 90 days.   ProgressTowards Goals: Progressing  Interventions: Supportive  Summary: Larence Thone is a 38 y.o. male who presents with dx of Major depression moderate and cannabis use disorder. Cayleb presents for session alert and oriented; mood and affect adequate; stable. Speech clear and coherent at normal rate and tone. Shares to have been doing ok and for depressive sxs to be off and on with ability to cope with presence of dog and keeping self busy. Notes to have purchased new vehichle and to be excited, showing therapist pictures. Shares have made final decision to move to [place] with mother and to be closer to family as he continues to need kidney transplant and ongoing appointments for medical health. 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 Shares for mental health to be stable and depression sxs manageable. Receptive of resources of OPT options in area in which he is moving. Denies current safety concerns. Sxs stable.   Suicidal/Homicidal: Nowithout intent/plan  Therapist Response: Therapist engaged [place] in therapy session. Provided safe space for Audry to share thoughts and feelings related to sxs of depression and coping skills used. Provided support and encouragement; validated feelings. Engaged in exploring thoughts on upcoming move; benefits of move and areas of concern. Assessed current concerns for depression and supported in reviewing need to do tasks prior to moving for smooth transition. Provided OPT resources in [place]. No  safety concerns reported; no follow up needed.   Plan: Return again in 0 weeks. Moving to [place]  Diagnosis: Moderately severe recurrent major depression (HCC)  Cannabis use disorder  Collaboration of Care: Other None  Patient/Guardian was advised Release of Information must be obtained prior to any record release in order to collaborate their care with an outside provider. Patient/Guardian was advised if they have not already done so to contact the registration department to sign all necessary forms in order for us  to release information regarding their care.   Consent: Patient/Guardian gives verbal consent for treatment and assignment of benefits for services provided during this visit. Patient/Guardian expressed understanding and agreed to proceed.   Ty Asal [place], [facility] 03/28/2023

## 2023-03-27 NOTE — Addendum Note (Signed)
 Addended by: Jonah Blue B on: 03/27/2023 08:30 AM   Modules accepted: Orders

## 2023-04-02 ENCOUNTER — Encounter: Payer: Self-pay | Admitting: Internal Medicine

## 2023-04-02 ENCOUNTER — Ambulatory Visit: Payer: Medicaid Other | Attending: Internal Medicine | Admitting: Internal Medicine

## 2023-04-02 VITALS — BP 153/78 | HR 76 | Temp 98.1°F | Ht 67.0 in | Wt 248.0 lb

## 2023-04-02 DIAGNOSIS — I152 Hypertension secondary to endocrine disorders: Secondary | ICD-10-CM

## 2023-04-02 DIAGNOSIS — E1142 Type 2 diabetes mellitus with diabetic polyneuropathy: Secondary | ICD-10-CM

## 2023-04-02 DIAGNOSIS — E119 Type 2 diabetes mellitus without complications: Secondary | ICD-10-CM

## 2023-04-02 DIAGNOSIS — N185 Chronic kidney disease, stage 5: Secondary | ICD-10-CM | POA: Insufficient documentation

## 2023-04-02 DIAGNOSIS — G8191 Hemiplegia, unspecified affecting right dominant side: Secondary | ICD-10-CM

## 2023-04-02 DIAGNOSIS — Z6838 Body mass index (BMI) 38.0-38.9, adult: Secondary | ICD-10-CM

## 2023-04-02 DIAGNOSIS — N184 Chronic kidney disease, stage 4 (severe): Secondary | ICD-10-CM | POA: Diagnosis not present

## 2023-04-02 DIAGNOSIS — E66812 Obesity, class 2: Secondary | ICD-10-CM

## 2023-04-02 DIAGNOSIS — F330 Major depressive disorder, recurrent, mild: Secondary | ICD-10-CM

## 2023-04-02 DIAGNOSIS — Z5986 Financial insecurity: Secondary | ICD-10-CM

## 2023-04-02 DIAGNOSIS — Z5941 Food insecurity: Secondary | ICD-10-CM

## 2023-04-02 DIAGNOSIS — Z7984 Long term (current) use of oral hypoglycemic drugs: Secondary | ICD-10-CM

## 2023-04-02 DIAGNOSIS — E1159 Type 2 diabetes mellitus with other circulatory complications: Secondary | ICD-10-CM | POA: Diagnosis not present

## 2023-04-02 LAB — POCT GLYCOSYLATED HEMOGLOBIN (HGB A1C): HbA1c, POC (controlled diabetic range): 6.2 % (ref 0.0–7.0)

## 2023-04-02 LAB — GLUCOSE, POCT (MANUAL RESULT ENTRY): POC Glucose: 226 mg/dL — AB (ref 70–99)

## 2023-04-02 MED ORDER — DEXCOM G6 TRANSMITTER MISC: 0 refills | Status: AC

## 2023-04-02 NOTE — Progress Notes (Signed)
 Patient ID: [name] [name], male    DOB: Jan 15, 1986  MRN: [id number]  CC: Diabetes (DM f/u. Med refill. /No questions / concerns/No to pneumonia vax)   Subjective: [name] [name] is a 38 y.o. male who presents for chronic ds management. 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   Reports he will be moving to [place]  04/21/2023 to be close to family.   HTN/CKD5: Gets med blister pack from [facility].  Should be on carvedilol  25 mg twice a day, chlorthalidone 12.5 mg daily, amlodipine  10 mg daily and Diovan  40 mg daily.  -BP elev today. Drank a Red Bull before coming in -His wrist BP device no longer works Limits salt Last saw kidney specialist 1-2 wks ago; no changes made.  GFR was 17.  Systolic blood pressure at that visit was in the 150s.  DM: Results for orders placed or performed in visit on 04/02/23  POCT glucose (manual entry)   Collection Time: 04/02/23  3:36 PM  Result Value Ref Range   POC Glucose 226 (A) 70 - 99 mg/dl  POCT glycosylated hemoglobin (Hb A1C)   Collection Time: 04/02/23  3:40 PM  Result Value Ref Range   Hemoglobin A1C     HbA1c POC (<> result, manual entry)     HbA1c, POC (prediabetic range)     HbA1c, POC (controlled diabetic range) 6.2 0.0 - 7.0 %  Pt on Jardiance  10 mg Reports Dexcom Reader not working properly; charge not holding.  Does his own cooking.  Eats out about 3xwk [employer]anna [employer]ak, [employer], [employer]). Drinks mainly water  Wgh stable since last visit in October.    He feels that the weakness on the right side of his body has improved a little bit in his lower extremity.  MDD: Reports that his depression is improved since he now has  a dog as an emotional support animal.  The dog brings him much joy.  Does not feel he needs to be on any medications.   Patient Active Problem List   Diagnosis Date Noted   CKD (chronic kidney disease) stage 5, GFR less than 15 ml/min (HCC) 04/02/2023   Cellulitis of abdominal wall 12/18/2022   History of amputation of toe (HCC) 06/25/2022   CKD stage 4 due to type 2 diabetes mellitus (HCC) 06/25/2022   Moderately severe recurrent major depression (HCC) 06/21/2022   Cannabis use disorder 06/21/2022   Nausea and vomiting 06/11/2022   Bloating 06/11/2022   Gastritis and gastroduodenitis 06/11/2022   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   Chest pain of uncertain etiology 11/08/2021   Gastroparesis 04/17/2021   Abnormality of gait 10/11/2020   Macroalbuminuric diabetic nephropathy (HCC) 08/10/2020   Hemiparesis of right dominant side due to non-cerebrovascular etiology (HCC) 08/10/2020   Cellulitis, leg 03/17/2020   Cellulitis of right leg 03/17/2020   Uncontrolled type 2 diabetes mellitus with complication, with long-term current use of insulin     Abscess    Cellulitis 12/05/2019   Cellulitis of left lower extremity    Abscess of left lower extremity    Uncontrolled type 2 diabetes mellitus with hyperglycemia (HCC)    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 2 diabetes mellitus (HCC)  Cellulitis of foot, left 11/22/2016   Diabetic neuropathy (HCC) 11/22/2016   Fracture of 2nd metatarsal 11/22/2016   Fracture of 3rd metatarsal 11/22/2016   Postprocedural non-healing wound 11/22/2016   Acute cystitis with positive culture    Cutaneous abscess of left foot    Poorly controlled type 2 diabetes mellitus with complication (HCC) 10/27/2016   Hyperlipidemia 10/17/2016   Depression 10/17/2016   Neck pain    Asthma 04/11/2016   Uncontrolled type 2 diabetes mellitus with complication    Obesity, morbid (HCC) 09/06/2015   Hypertension associated with diabetes (HCC) 09/06/2015     Current  Outpatient Medications on File Prior to Visit  Medication Sig Dispense Refill   acetaminophen  (TYLENOL ) 500 MG tablet Take 1,000 mg by mouth every 6 (six) hours as needed for moderate pain.     amLODipine  (NORVASC ) 10 MG tablet TAKE ONE TABLET BY MOUTH ONCE DAILY (AM) 90 tablet 1   carvedilol  (COREG ) 25 MG tablet TAKE 1 TABLET BY MOUTH 2 (TWO) TIMES DAILY WITH A MEAL (AM+PM) 180 tablet 1   chlorthalidone (HYGROTON) 25 MG tablet Take 12.5 mg by mouth daily.     Continuous Glucose Sensor (DEXCOM G6 SENSOR) MISC USE DAILY AS DIRECTed. 3 each 3   empagliflozin  (JARDIANCE ) 10 MG TABS tablet TAKE ONE TABLET BY MOUTH ON MONDAY,WEDNESDAY AND FRIDAY (AM). 12 tablet 6   famotidine  (PEPCID ) 20 MG tablet TAKE 1 TABLET (20 MG TOTAL) BY MOUTH AT BEDTIME. 90 tablet 3   FEROSUL 325 (65 Fe) MG tablet TAKE 1 TABLET (325 MG TOTAL) BY MOUTH DAILY WITH BREAKFAST. 100 tablet 1   gabapentin  (NEURONTIN ) 300 MG capsule Take 1 capsule (300 mg total) by mouth daily. 30 capsule 6   oxyCODONE -acetaminophen  (PERCOCET) 5-325 MG tablet Take 1 tablet by mouth every 6 (six) hours as needed for severe pain (pain score 7-10). 10 tablet 0   pravastatin  (PRAVACHOL ) 20 MG tablet TAKE 1 TABLET (20 MG TOTAL) BY MOUTH DAILY. (BEDTIME) 90 tablet 1   sildenafil  (VIAGRA ) 100 MG tablet 1 tab PO 1/2 hr prior to intercourse PRN.  Limit use to 1 tab/24 hr 10 tablet 1   valsartan  (DIOVAN ) 40 MG tablet TAKE 1 TABLET (40 MG TOTAL) BY MOUTH DAILY. (AM) 90 tablet 1   No current facility-administered medications on file prior to visit.    No Known Allergies  Social History   Socioeconomic History   Marital status: Single    Spouse name: Not on file   Number of children: 2   Years of education: Not on file   Highest education level: Not on file  Occupational History   Occupation: disabled  Tobacco Use   Smoking status: Never   Smokeless tobacco: Never  Vaping Use   Vaping status: Never Used  Substance and Sexual Activity   Alcohol   use: Yes    Comment: rarely per patient on 01/25/23:  11/06/2016 I'll drink a little at parties; maybe 5 times/year   Drug use: Yes    Types: Marijuana    Comment: Daily - patient states he will withhold 24 hrs prior to 01/29/23 surgery   Sexual activity: Not Currently    Birth control/protection: None  Other Topics Concern   Not on file  Social History Narrative   Not on file   Social Drivers of Health   Financial Resource Strain: Medium Risk (12/31/2022)   Overall Financial Resource Strain (CARDIA)    Difficulty of Paying Living Expenses: Somewhat hard  Food Insecurity: Food Insecurity  Present (12/31/2022)   Hunger Vital Sign    Worried About Running Out of Food in the Last Year: Often true    Ran Out of Food in the Last Year: Often true  Transportation Needs: No Transportation Needs (12/31/2022)   PRAPARE - Administrator, Civil Service (Medical): No    Lack of Transportation (Non-Medical): No  Physical Activity: Inactive (12/31/2022)   Exercise Vital Sign    Days of Exercise per Week: 0 days    Minutes of Exercise per Session: 0 min  Stress: Stress Concern Present (12/31/2022)   [employer] of Occupational Health - Occupational Stress Questionnaire    Feeling of Stress : Very much  Social Connections: Socially Isolated (12/31/2022)   Social Connection and Isolation Panel [NHANES]    Frequency of Communication with Friends and Family: More than three times a week    Frequency of Social Gatherings with Friends and Family: Twice a week    Attends Religious Services: Never    Database Administrator or Organizations: No    Attends Banker Meetings: Never    Marital Status: Divorced  Catering Manager Violence: Not At Risk (12/31/2022)   Humiliation, Afraid, Rape, and Kick questionnaire    Fear of Current or Ex-Partner: No    Emotionally Abused: No    Physically Abused: No    Sexually Abused: No    Family History  Problem Relation Age of  Onset   Diabetes type II Mother    Coronary artery disease Mother    Diabetes type II Father    Colon cancer Maternal Grandfather    Lung cancer Maternal Grandfather    Prostate cancer Maternal Grandfather    Diabetes Maternal Grandfather    Hypertension Maternal Grandfather    Heart disease Paternal Grandfather    Parkinson's disease Paternal Aunt    Multiple sclerosis Paternal Aunt     Past Surgical History:  Procedure Laterality Date   AMPUTATION Left 11/05/2016   Procedure: AMPUTATION LEFT GREAT TOE;  Surgeon: Harden Jerona GAILS, MD;  Location: [facility] OR;  Service: Orthopedics;  Laterality: Left;   AV FISTULA PLACEMENT Right 12/21/2022   Procedure: RIGHT BRACHIOCEPHALIC ARTERIOVENOUS (AV) FISTULA CREATION;  Surgeon: Sheree Penne Bruckner, MD;  [facility] OR;  Service: Vascular;  Laterality: Right;   AV FISTULA PLACEMENT Right 01/29/2023   Procedure: RIGHT ARM BASILIC VEIN FISTULA CREATION;  Surgeon: Sheree Penne Bruckner, MD;  Location: [facility] OR;  Service: Vascular;  Laterality: Right;   BIOPSY  06/11/2022   Procedure: BIOPSY;  Surgeon: San Sandor GAILS, DO;  Location: [facility];  Service: Gastroenterology;;   CAPD INSERTION N/A 11/29/2022   Procedure: LAPAROSCOPIC INSERTION CONTINUOUS AMBULATORY PERITONEAL DIALYSIS  (CAPD) CATHETER;  Surgeon: Magda Debby SAILOR, MD;  Location: [facility] OR;  Service: Vascular;  Laterality: N/A;   CAPD REMOVAL N/A 12/21/2022   Procedure: LAPAROSCOPIC REMOVAL CONTINUOUS AMBULATORY PERITONEAL DIALYSIS  (CAPD) CATHETER;  Surgeon: Sheree Penne Bruckner, MD;  Location: [facility] OR;  Service: Vascular;  Laterality: N/A;   CATARACT EXTRACTION Left 07/2020   DRESSING CHANGE UNDER ANESTHESIA N/A 04/13/2016   Procedure: DRESSING CHANGE UNDER ANESTHESIA;  Surgeon: Donnice Lunger, MD;  Location: [facility] ORS;  Service: General;  Laterality: N/A;   ESOPHAGOGASTRODUODENOSCOPY (EGD) WITH PROPOFOL  N/A 06/11/2022   Procedure: ESOPHAGOGASTRODUODENOSCOPY (EGD) WITH PROPOFOL ;   Surgeon: San Sandor GAILS, DO;  Location: [facility];  Service: Gastroenterology;  Laterality: N/A;   INCISION AND DRAINAGE ABSCESS Right 09/03/2015   Procedure: INCISION AND DRAINAGE ABSCESS;  Surgeon: Elsie Mussel, MD;  Location: [facility];  Service: Orthopedics;  Laterality: Right;   INCISION AND DRAINAGE PERIRECTAL ABSCESS N/A 04/11/2016   Procedure: IRRIGATION AND DEBRIDEMENT RECTAL ABSCESS;  Surgeon: Donnice Lunger, MD;  Location: [facility];  Service: General;  Laterality: N/A;   INGUINAL HERNIA REPAIR  1988; 1989   x2    IRRIGATION AND DEBRIDEMENT ABSCESS Left 08/31/2015   Procedure: IRRIGATION AND DEBRIDEMENT ABSCESS LEFT SHOULDER;  Surgeon: Alm Angle, MD;  Location: [facility];  Service: General;  Laterality: Left;   RETINAL LASER PROCEDURE Bilateral    TUMOR EXCISION     neck   WOUND DEBRIDEMENT N/A 02/24/2016   Procedure: SACRAL WOUND DEBRIDEMENT;  Surgeon: Dann Hummer, MD;  Location: [facility] OR;  Service: General;  Laterality: N/A;    ROS: Review of Systems Negative except as stated above  PHYSICAL EXAM: BP (!) 153/78   Pulse 76   Temp 98.1 F (36.7 C) (Oral)   Ht 5' 7 (1.702 m)   Wt 248 lb (112.5 kg)   SpO2 100%   BMI 38.84 kg/m   Wt Readings from Last 3 Encounters:  04/02/23 248 lb (112.5 kg)  03/06/23 244 lb 12.8 oz (111 kg)  02/26/23 240 lb (108.9 kg)    Physical Exam   General appearance - alert, well appearing, and in no distress Mental status - alert, oriented to person, place, and time Chest - clear to auscultation, no wheezes, rales or rhonchi, symmetric air entry Heart - normal rate, regular rhythm, normal S1, S2, no murmurs, rubs, clicks or gallops Neurological -power in the left lower extremity 5/5.  Right lower extremity 4/5.  Grip right side 2/5, left side 5/5.  Right arm is in a sling. Extremities -no lower extremity edema.    04/02/2023    3:39 PM 12/31/2022    4:34 PM 08/28/2022    3:22 PM  Depression screen PHQ 2/9  Decreased Interest 1 2    Down, Depressed, Hopeless 1 1   PHQ - 2 Score 2 3   Altered sleeping 2 1   Tired, decreased energy 3 3   Change in appetite 1 2   Feeling bad or failure about yourself  1 1   Trouble concentrating 1 1   Moving slowly or fidgety/restless 1 0   Suicidal thoughts  0   PHQ-9 Score 11 11   Difficult doing work/chores Somewhat difficult Somewhat difficult      Information is confidential and restricted. Go to Review Flowsheets to unlock data.       Latest Ref Rng & Units 01/29/2023    7:23 AM 12/21/2022   10:26 PM 12/21/2022    6:19 AM  CMP  Glucose 70 - 99 mg/dL 830  722  879   BUN 6 - 20 mg/dL 59   50   Creatinine 9.38 - 1.24 mg/dL 6.09   5.92   Sodium 864 - 145 mmol/L 141   134   Potassium 3.5 - 5.1 mmol/L 4.3   4.4   Chloride 98 - 111 mmol/L 109   102   CO2 22 - 32 mmol/L   19   Calcium  8.9 - 10.3 mg/dL   9.0    Lipid Panel     Component Value Date/Time   CHOL 149 03/21/2021 1536   TRIG 104 03/21/2021 1536   HDL 37 (L) 03/21/2021 1536   CHOLHDL 4.0 03/21/2021 1536   CHOLHDL 4.6 12/06/2019 0749   VLDL 23 12/06/2019 0749   LDLCALC  93 03/21/2021 1536    CBC    Component Value Date/Time   WBC 16.7 (H) 12/20/2022 0457   RBC 3.09 (L) 12/20/2022 0457   HGB 11.2 (L) 01/29/2023 0723   HGB 9.7 (L) 06/25/2022 1627   HCT 33.0 (L) 01/29/2023 0723   HCT 28.7 (L) 06/25/2022 1627   PLT 274 12/20/2022 0457   PLT 258 06/25/2022 1627   MCV 90.3 12/20/2022 0457   MCV 90 06/25/2022 1627   MCH 29.1 12/20/2022 0457   MCHC 32.3 12/20/2022 0457   RDW 12.8 12/20/2022 0457   RDW 13.7 06/25/2022 1627   LYMPHSABS 1.3 12/20/2022 0457   LYMPHSABS 1.7 01/27/2020 1209   MONOABS 1.5 (H) 12/20/2022 0457   EOSABS 0.3 12/20/2022 0457   EOSABS 0.1 01/27/2020 1209   BASOSABS 0.1 12/20/2022 0457   BASOSABS 0.1 01/27/2020 1209    ASSESSMENT AND PLAN: 1. Type 2 diabetes mellitus with peripheral neuropathy (HCC) (Primary) Controlled.  Continue Jardiance . Encourage healthier eating habits  and avoid junk foods. Advised patient to get established with a PCP as soon as he relocates to [place]  which he plans to do the early part of next month. - POCT glycosylated hemoglobin (Hb A1C) - POCT glucose (manual entry) - Continuous Glucose Transmitter (DEXCOM G6 TRANSMITTER) MISC; as directed to check blood sugars.  Dispense: 1 each; Refill: 0 - Ambulatory referral to Ophthalmology  2. Diabetes mellitus treated with oral medication (HCC) See #1 above.  3. Hypertension associated with diabetes (HCC) Not at goal but repeat blood pressure check was better. Continue carvedilol  25 mg twice a day, chlorthalidone 12.5 mg daily, amlodipine  10 mg daily and Diovan  40 mg daily.   4. CKD (chronic kidney disease) stage 4, GFR less than 15 ml/min (HCC) Advised patient to get established with a nephrologist once he relocates to [place] .  No NSAIDs.  5. Hemiparesis of right dominant side due to non-cerebrovascular etiology (HCC) Stable.  6. Mild recurrent major depression (HCC) Doing good now that he has an emotional support animal.  7. Morbid obesity with body mass index (BMI) of 40.0 to 44.9 in adult [facility]) Morbid obesity associated with hypertension, diabetes and CKD.  See #1 above     Patient was given the opportunity to ask questions.  Patient verbalized understanding of the plan and was able to repeat key elements of the plan.   This documentation was completed using Paediatric nurse.  Any transcriptional errors are unintentional.  Orders Placed This Encounter  Procedures   Ambulatory referral to Ophthalmology   POCT glycosylated hemoglobin (Hb A1C)   POCT glucose (manual entry)     Requested Prescriptions   Signed Prescriptions Disp Refills   Continuous Glucose Transmitter (DEXCOM G6 TRANSMITTER) MISC 1 each 0    Sig: as directed to check blood sugars.    Return in about 4 months (around 07/31/2023).  Barnie Louder, MD, FACP

## 2023-04-09 ENCOUNTER — Other Ambulatory Visit: Payer: Self-pay

## 2023-04-09 DIAGNOSIS — N185 Chronic kidney disease, stage 5: Secondary | ICD-10-CM

## 2023-04-18 LAB — LAB REPORT - SCANNED: EGFR: 20

## 2023-04-25 ENCOUNTER — Other Ambulatory Visit: Payer: Self-pay

## 2023-04-25 ENCOUNTER — Encounter (HOSPITAL_COMMUNITY): Payer: Self-pay | Admitting: Vascular Surgery

## 2023-04-25 NOTE — Progress Notes (Signed)
 Anesthesia Chart Review: Same day workup  38 year old male with pertinent history including non-insulin -dependent DM2 (A1c 6.2 on 04/02/23), bilateral retinopathy, gastroparesis, CKD 5 secondary to diabetic kidney disease not yet on HD, peripheral nephropathy, amputation of several toes of the right foot, hypertension, hyperlipidemia, chronic normocytic anemia, bipolar disorder, right-sided hemiparesis after removal of benign cervical spinal tumor in [place]  in 2020.   CKD is followed by Dr. Macel at [facility].   Patient previously had laparoscopic peritoneal dialysis catheter placed 11/29/2022.  He was subsequently hospitalized with cellulitis and peritonitis which necessitated removal of catheter.  He had right brachiocephalic AV fistula creation on 12/21/2022.  On follow-up with vascular surgery on 01/23/2023 the right brachiocephalic fistula was found to have been thrombosed.  He had right arm basilic vein fistula creation on 01/29/2023.  He is not yet on dialysis.   Recent anesthesia note 06/11/2022 is marked as difficult airway and GlideScope was used electively.  Per note, Difficulty Due To: Difficulty was anticipated and Difficult Airway- due to reduced neck mobility Future Recommendations: Recommend- induction with short-acting agent, and alternative techniques readily available Comments: Patient had posterior cervical fusion.   Patient will need day of surgery labs and evaluation.   EKG 04/18/2021: NSR.  Rate 98.   Nuclear stress 04/27/2021:   The study is normal. The study is low risk.   No ST deviation was noted.   Left ventricular function is normal. Nuclear stress EF: 56 %. The left ventricular ejection fraction is normal (55-65%). End diastolic cavity size is normal.   Prior study not available for comparison.   TTE 11/08/2016: - Left ventricle: The cavity size was normal. Systolic function was    normal. The estimated ejection fraction was in the range of 60%     to 65%. Wall motion was normal; there were no regional wall    motion abnormalities.     Myshawn, [place] [facility] Short Stay Center/Anesthesiology Phone [phone] 04/25/2023 9:13 AM

## 2023-04-25 NOTE — Progress Notes (Signed)
 SDW CALL  Patient was given pre-op  instructions over the phone. The opportunity was given for the patient to ask questions. No further questions asked. Patient verbalized understanding of instructions given.   PCP - Barnie Vicci COME Cardiologist - Aleene Alveta COME  PPM/ICD - denies Device Orders -  Rep Notified -   Chest x-ray - 07/14/21 EKG - 01/29/23 Stress Test - 04/27/21 ECHO - 11/08/16 Cardiac Cath - denies  Sleep Study - denies CPAP -   Fasting Blood Sugar - 120-160 Checks Blood Sugar one times a day  Blood Thinner Instructions:na Aspirin Instructions:na  ERAS Protcol -no PRE-SURGERY Ensure or G2-   COVID TEST- na   Anesthesia review: yes- difficult airway  Patient denies shortness of breath, fever, cough and chest pain over the phone call    Surgical Instructions    Your procedure is scheduled on February 7  Report to [facility] Main Entrance A at 0630 A.M., then check in with the Admitting office.  Call this number if you have problems the morning of surgery:  [phone]    Remember:  Do not eat or drink anything after midnight the night before your surgery    Take these medicines the morning of surgery with A SIP OF WATER : Norvasc ,Coreg ,Gabapentin . PRN- Tylenol  or Percocet  As of today, STOP taking any Aspirin (unless otherwise instructed by your surgeon) Aleve , Naproxen , Ibuprofen , Motrin , Advil , Goody's, BC's, all herbal medications, fish oil, and all vitamins.  WHAT DO I DO ABOUT MY DIABETES MEDICATION? Do not take oral diabetes medicines (pills) the morning of surgery.  Do Not take Jardiance  the day before or the day of surgery.  Check your blood sugar the morning of your surgery when you wake up and every 2 hours until you get to the Short Stay unit.  If your blood sugar is less than 70 mg/dL, you will need to treat for low blood sugar: Do not take insulin . Treat a low blood sugar (less than 70 mg/dL) with  cup of clear juice (cranberry  or apple), 4 glucose tablets, OR glucose gel. Recheck blood sugar in 15 minutes after treatment (to make sure it is greater than 70 mg/dL). If your blood sugar is not greater than 70 mg/dL on recheck, call [phone] for further instructions. Report your blood sugar to the short stay nurse when you get to Short Stay.   is not responsible for any belongings or valuables. .   Do NOT Smoke (Tobacco/Vaping)  24 hours prior to your procedure  If you use a CPAP at night, you may bring your mask for your overnight stay.   Contacts, glasses, hearing aids, dentures or partials may not be worn into surgery, please bring cases for these belongings   Patients discharged the day of surgery will not be allowed to drive home, and someone needs to stay with them for 24 hours.  Special instructions:    Oral Hygiene is also important to reduce your risk of infection.  Remember - BRUSH YOUR TEETH THE MORNING OF SURGERY WITH YOUR REGULAR TOOTHPASTE   Day of Surgery:  Take a shower the day of or night before with antibacterial soap. Wear Clean/Comfortable clothing the morning of surgery Do not apply any deodorants/lotions.   Do not wear jewelry or makeup Do not wear lotions, powders, perfumes/colognes, or deodorant. Do not shave 48 hours prior to surgery.  Men may shave face and neck. Do not bring valuables to the hospital. Do not wear nail polish, gel polish, artificial nails,  or any other type of covering on natural nails (fingers and toes) If you have artificial nails or gel coating that need to be removed by a nail salon, please have this removed prior to surgery. Artificial nails or gel coating may interfere with anesthesia's ability to adequately monitor your vital signs. Remember to brush your teeth WITH YOUR REGULAR TOOTHPASTE.

## 2023-04-25 NOTE — Anesthesia Preprocedure Evaluation (Addendum)
 Anesthesia Evaluation  Patient identified by MRN, date of birth, ID band Patient awake    Reviewed: Allergy & Precautions, NPO status , Patient's Chart, lab work & pertinent test results  History of Anesthesia Complications (+) history of anesthetic complications (required VideoGlide intubation)  Airway Mallampati: II  TM Distance: >3 FB Neck ROM: Full    Dental  (+) Dental Advisory Given   Pulmonary neg pulmonary ROS, Patient abstained from smoking.   breath sounds clear to auscultation       Cardiovascular hypertension, Pt. on medications and Pt. on home beta blockers  Rhythm:Regular Rate:Normal  04/2021 ECHO:      The study is normal. The study is low risk.   No ST deviation was noted.   Left ventricular function is normal. Nuclear stress EF: 56 %. The left ventricular ejection fraction is normal (55-65%).     Neuro/Psych    Depression Bipolar Disorder   negative neurological ROS     GI/Hepatic Neg liver ROS,GERD  Controlled,,  Endo/Other  diabetes    Renal/GU ESRFRenal disease (not on dialysis yet)     Musculoskeletal  (+) Arthritis ,    Abdominal   Peds  Hematology  (+) Blood dyscrasia (Hb 10.2), anemia   Anesthesia Other Findings   Reproductive/Obstetrics                             Anesthesia Physical Anesthesia Plan  ASA: 3  Anesthesia Plan: General   Post-op Pain Management: Tylenol  PO (pre-op )*   Induction: Intravenous  PONV Risk Score and Plan: 2 and Ondansetron  and Dexamethasone   Airway Management Planned: LMA  Additional Equipment: None  Intra-op Plan:   Post-operative Plan:   Informed Consent: I have reviewed the patients History and Physical, chart, labs and discussed the procedure including the risks, benefits and alternatives for the proposed anesthesia with the patient or authorized representative who has indicated his/her understanding and acceptance.      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  CKD is followed by Dr. Macel at [facility].  Patient previously had laparoscopic peritoneal dialysis catheter placed 11/29/2022.  He was subsequently hospitalized with cellulitis and peritonitis which necessitated removal of catheter.  He had right brachiocephalic AV fistula creation on 12/21/2022.  On follow-up with vascular surgery on 01/23/2023 the right brachiocephalic fistula was found to have been thrombosed.  He had right arm basilic vein fistula creation on 01/29/2023.  He is not yet on dialysis.  Recent anesthesia note 06/11/2022 is marked as difficult airway and GlideScope was used electively.  Per note, Difficulty Due To: Difficulty was anticipated and Difficult Airway- due to reduced neck mobility Future Recommendations: Recommend- induction with short-acting agent, and alternative techniques readily available Comments: Patient had posterior cervical fusion.  Patient will need day of surgery labs and evaluation.  EKG 04/18/2021: NSR.  Rate 98.  Nuclear stress 04/27/2021:   The study is normal. The study is low risk.   No ST deviation was noted.   Left ventricular function  is normal. Nuclear stress EF: 56 %. The left ventricular ejection fraction is normal (55-65%). End diastolic cavity size is normal.   Prior study not available for comparison.  TTE 11/08/2016: - Left ventricle: The cavity  size was normal. Systolic function was  normal. The estimated ejection fraction was in the range of 60%  to 65%. Wall motion was normal; there were no regional wall  motion abnormalities.   )        Anesthesia Quick Evaluation

## 2023-04-26 ENCOUNTER — Encounter (HOSPITAL_COMMUNITY): Payer: Self-pay | Admitting: Vascular Surgery

## 2023-04-26 ENCOUNTER — Ambulatory Visit (HOSPITAL_COMMUNITY): Payer: Medicaid Other | Admitting: Physician Assistant

## 2023-04-26 ENCOUNTER — Other Ambulatory Visit (HOSPITAL_COMMUNITY): Payer: Self-pay

## 2023-04-26 ENCOUNTER — Other Ambulatory Visit: Payer: Self-pay

## 2023-04-26 ENCOUNTER — Ambulatory Visit (HOSPITAL_BASED_OUTPATIENT_CLINIC_OR_DEPARTMENT_OTHER): Payer: Medicaid Other | Admitting: Physician Assistant

## 2023-04-26 ENCOUNTER — Ambulatory Visit (HOSPITAL_COMMUNITY)
Admission: RE | Admit: 2023-04-26 | Discharge: 2023-04-26 | Disposition: A | Discharge status: Home / Self Care | Payer: Medicaid Other | Attending: Vascular Surgery | Admitting: Vascular Surgery

## 2023-04-26 ENCOUNTER — Encounter (HOSPITAL_COMMUNITY)
Admission: RE | Disposition: A | Discharge status: Home / Self Care | Payer: Self-pay | Source: Home / Self Care | Attending: Vascular Surgery

## 2023-04-26 DIAGNOSIS — Z7984 Long term (current) use of oral hypoglycemic drugs: Secondary | ICD-10-CM | POA: Insufficient documentation

## 2023-04-26 DIAGNOSIS — E1122 Type 2 diabetes mellitus with diabetic chronic kidney disease: Secondary | ICD-10-CM | POA: Diagnosis not present

## 2023-04-26 DIAGNOSIS — I129 Hypertensive chronic kidney disease with stage 1 through stage 4 chronic kidney disease, or unspecified chronic kidney disease: Secondary | ICD-10-CM | POA: Diagnosis not present

## 2023-04-26 DIAGNOSIS — N184 Chronic kidney disease, stage 4 (severe): Secondary | ICD-10-CM | POA: Insufficient documentation

## 2023-04-26 DIAGNOSIS — Z992 Dependence on renal dialysis: Secondary | ICD-10-CM

## 2023-04-26 DIAGNOSIS — N186 End stage renal disease: Secondary | ICD-10-CM | POA: Diagnosis not present

## 2023-04-26 DIAGNOSIS — I120 Hypertensive chronic kidney disease with stage 5 chronic kidney disease or end stage renal disease: Secondary | ICD-10-CM

## 2023-04-26 DIAGNOSIS — N185 Chronic kidney disease, stage 5: Secondary | ICD-10-CM

## 2023-04-26 HISTORY — PX: BASCILIC VEIN TRANSPOSITION: SHX5742

## 2023-04-26 LAB — POCT I-STAT, CHEM 8
BUN: 71 mg/dL — ABNORMAL HIGH (ref 6–20)
Calcium, Ion: 1.19 mmol/L (ref 1.15–1.40)
Chloride: 105 mmol/L (ref 98–111)
Creatinine, Ser: 5.4 mg/dL — ABNORMAL HIGH (ref 0.61–1.24)
Glucose, Bld: 136 mg/dL — ABNORMAL HIGH (ref 70–99)
HCT: 30 % — ABNORMAL LOW (ref 39.0–52.0)
Hemoglobin: 10.2 g/dL — ABNORMAL LOW (ref 13.0–17.0)
Potassium: 3.9 mmol/L (ref 3.5–5.1)
Sodium: 138 mmol/L (ref 135–145)
TCO2: 22 mmol/L (ref 22–32)

## 2023-04-26 LAB — GLUCOSE, CAPILLARY
Glucose-Capillary: 133 mg/dL — ABNORMAL HIGH (ref 70–99)
Glucose-Capillary: 144 mg/dL — ABNORMAL HIGH (ref 70–99)

## 2023-04-26 SURGERY — TRANSPOSITION, VEIN, BASILIC
Anesthesia: General | Site: Arm Upper | Laterality: Right

## 2023-04-26 MED ORDER — ONDANSETRON HCL 4 MG/2ML IJ SOLN
INTRAMUSCULAR | Status: DC | PRN
  Administered 2023-04-26: 4 mg via INTRAVENOUS

## 2023-04-26 MED ORDER — CHLORHEXIDINE GLUCONATE 4 % EX SOLN: 60.0000 mL | Freq: Once | CUTANEOUS | Status: DC

## 2023-04-26 MED ORDER — HEPARIN 6000 UNIT IRRIGATION SOLUTION
Status: DC | PRN
  Administered 2023-04-26: 1

## 2023-04-26 MED ORDER — PHENYLEPHRINE HCL-NACL 20-0.9 MG/250ML-% IV SOLN
INTRAVENOUS | Status: DC | PRN
  Administered 2023-04-26: 25 ug/min via INTRAVENOUS

## 2023-04-26 MED ORDER — PHENYLEPHRINE 80 MCG/ML (10ML) SYRINGE FOR IV PUSH (FOR BLOOD PRESSURE SUPPORT)
PREFILLED_SYRINGE | INTRAVENOUS | Status: AC
  Filled 2023-04-26: qty 10

## 2023-04-26 MED ORDER — FENTANYL CITRATE (PF) 100 MCG/2ML IJ SOLN: 25.0000 ug | INTRAMUSCULAR | Status: DC | PRN

## 2023-04-26 MED ORDER — OXYCODONE HCL 5 MG PO TABS: 5.0000 mg | ORAL_TABLET | Freq: Once | ORAL | Status: DC | PRN

## 2023-04-26 MED ORDER — EPHEDRINE 5 MG/ML INJ
INTRAVENOUS | Status: AC
  Filled 2023-04-26: qty 5

## 2023-04-26 MED ORDER — SODIUM CHLORIDE 0.9 % IV SOLN: INTRAVENOUS | Status: DC

## 2023-04-26 MED ORDER — ONDANSETRON HCL 4 MG/2ML IJ SOLN
INTRAMUSCULAR | Status: AC
  Filled 2023-04-26: qty 2

## 2023-04-26 MED ORDER — DEXAMETHASONE SODIUM PHOSPHATE 10 MG/ML IJ SOLN
INTRAMUSCULAR | Status: DC | PRN
  Administered 2023-04-26: 4 mg via INTRAVENOUS

## 2023-04-26 MED ORDER — EPHEDRINE SULFATE-NACL 50-0.9 MG/10ML-% IV SOSY
PREFILLED_SYRINGE | INTRAVENOUS | Status: DC | PRN
  Administered 2023-04-26: 5 mg via INTRAVENOUS
  Administered 2023-04-26: 10 mg via INTRAVENOUS
  Administered 2023-04-26: 5 mg via INTRAVENOUS

## 2023-04-26 MED ORDER — MIDAZOLAM HCL 2 MG/2ML IJ SOLN
INTRAMUSCULAR | Status: DC | PRN
  Administered 2023-04-26: 2 mg via INTRAVENOUS

## 2023-04-26 MED ORDER — SODIUM CHLORIDE 0.9% FLUSH: 3.0000 mL | Freq: Two times a day (BID) | INTRAVENOUS | Status: DC

## 2023-04-26 MED ORDER — PROPOFOL 10 MG/ML IV BOLUS
INTRAVENOUS | Status: DC | PRN
  Administered 2023-04-26: 200 mg via INTRAVENOUS

## 2023-04-26 MED ORDER — PHENYLEPHRINE 80 MCG/ML (10ML) SYRINGE FOR IV PUSH (FOR BLOOD PRESSURE SUPPORT)
PREFILLED_SYRINGE | INTRAVENOUS | Status: DC | PRN
  Administered 2023-04-26 (×3): 80 ug via INTRAVENOUS

## 2023-04-26 MED ORDER — INSULIN ASPART 100 UNIT/ML IJ SOLN: 0.0000 [IU] | INTRAMUSCULAR | Status: DC | PRN

## 2023-04-26 MED ORDER — CARVEDILOL 12.5 MG PO TABS
25.0000 mg | ORAL_TABLET | Freq: Once | ORAL | Status: AC
  Administered 2023-04-26: 25 mg via ORAL
  Filled 2023-04-26: qty 2

## 2023-04-26 MED ORDER — OXYCODONE HCL 5 MG PO TABS
5.0000 mg | ORAL_TABLET | ORAL | 0 refills | Status: AC | PRN
  Filled 2023-04-26: qty 15, 3d supply, fill #0

## 2023-04-26 MED ORDER — MIDAZOLAM HCL 2 MG/2ML IJ SOLN
INTRAMUSCULAR | Status: AC
  Filled 2023-04-26: qty 2

## 2023-04-26 MED ORDER — MEPERIDINE HCL 25 MG/ML IJ SOLN: 6.2500 mg | INTRAMUSCULAR | Status: DC | PRN

## 2023-04-26 MED ORDER — LIDOCAINE 2% (20 MG/ML) 5 ML SYRINGE
INTRAMUSCULAR | Status: AC
  Filled 2023-04-26: qty 5

## 2023-04-26 MED ORDER — FENTANYL CITRATE (PF) 250 MCG/5ML IJ SOLN
INTRAMUSCULAR | Status: AC
  Filled 2023-04-26: qty 5

## 2023-04-26 MED ORDER — FENTANYL CITRATE (PF) 100 MCG/2ML IJ SOLN
INTRAMUSCULAR | Status: AC
  Administered 2023-04-26: 50 ug via INTRAVENOUS
  Filled 2023-04-26: qty 2

## 2023-04-26 MED ORDER — PROPOFOL 10 MG/ML IV BOLUS
INTRAVENOUS | Status: AC
  Filled 2023-04-26: qty 20

## 2023-04-26 MED ORDER — LIDOCAINE 2% (20 MG/ML) 5 ML SYRINGE
INTRAMUSCULAR | Status: DC | PRN
  Administered 2023-04-26: 50 mg via INTRAVENOUS

## 2023-04-26 MED ORDER — MIDAZOLAM HCL 2 MG/2ML IJ SOLN
0.5000 mg | Freq: Once | INTRAMUSCULAR | Status: DC | PRN
Start: 2023-04-26 — End: 2023-04-26

## 2023-04-26 MED ORDER — 0.9 % SODIUM CHLORIDE (POUR BTL) OPTIME
TOPICAL | Status: DC | PRN
  Administered 2023-04-26: 1000 mL

## 2023-04-26 MED ORDER — SODIUM CHLORIDE 0.9% FLUSH: 3.0000 mL | INTRAVENOUS | Status: DC | PRN

## 2023-04-26 MED ORDER — HEPARIN 6000 UNIT IRRIGATION SOLUTION
Status: AC
  Filled 2023-04-26: qty 500

## 2023-04-26 MED ORDER — CHLORHEXIDINE GLUCONATE 0.12 % MT SOLN
15.0000 mL | Freq: Once | OROMUCOSAL | Status: AC
  Administered 2023-04-26: 15 mL via OROMUCOSAL
  Filled 2023-04-26: qty 15

## 2023-04-26 MED ORDER — ORAL CARE MOUTH RINSE: 15.0000 mL | Freq: Once | OROMUCOSAL | Status: AC

## 2023-04-26 MED ORDER — STERILE WATER FOR IRRIGATION IR SOLN
Status: DC | PRN
  Administered 2023-04-26: 1000 mL

## 2023-04-26 MED ORDER — OXYCODONE HCL 5 MG/5ML PO SOLN
5.0000 mg | Freq: Once | ORAL | Status: DC | PRN
Start: 2023-04-26 — End: 2023-04-26

## 2023-04-26 MED ORDER — CEFAZOLIN SODIUM-DEXTROSE 2-4 GM/100ML-% IV SOLN
2.0000 g | INTRAVENOUS | Status: AC
  Administered 2023-04-26: 2 g via INTRAVENOUS
  Filled 2023-04-26: qty 100

## 2023-04-26 MED ORDER — FENTANYL CITRATE (PF) 250 MCG/5ML IJ SOLN
INTRAMUSCULAR | Status: DC | PRN
  Administered 2023-04-26: 100 ug via INTRAVENOUS
  Administered 2023-04-26: 50 ug via INTRAVENOUS

## 2023-04-26 SURGICAL SUPPLY — 25 items
ARMBAND PINK RESTRICT EXTREMIT (MISCELLANEOUS) ×1 IMPLANT
BAG COUNTER SPONGE SURGICOUNT (BAG) ×1 IMPLANT
CANISTER SUCT 3000ML PPV (MISCELLANEOUS) ×1 IMPLANT
CLIP LIGATING EXTRA MED SLVR (CLIP) ×1 IMPLANT
CLIP LIGATING EXTRA SM BLUE (MISCELLANEOUS) ×1 IMPLANT
COVER PROBE W GEL 5X96 (DRAPES) ×1 IMPLANT
DERMABOND ADVANCED .7 DNX12 (GAUZE/BANDAGES/DRESSINGS) ×1 IMPLANT
ELECT REM PT RETURN 9FT ADLT (ELECTROSURGICAL) ×1
ELECTRODE REM PT RTRN 9FT ADLT (ELECTROSURGICAL) ×1 IMPLANT
GLOVE BIO SURGEON STRL SZ7.5 (GLOVE) ×1 IMPLANT
GOWN STRL REUS W/ TWL LRG LVL3 (GOWN DISPOSABLE) ×2 IMPLANT
GOWN STRL REUS W/ TWL XL LVL3 (GOWN DISPOSABLE) ×1 IMPLANT
KIT BASIN OR (CUSTOM PROCEDURE TRAY) ×1 IMPLANT
KIT TURNOVER KIT B (KITS) ×1 IMPLANT
NS IRRIG 1000ML POUR BTL (IV SOLUTION) ×1 IMPLANT
PACK CV ACCESS (CUSTOM PROCEDURE TRAY) ×1 IMPLANT
PAD ARMBOARD 7.5X6 YLW CONV (MISCELLANEOUS) ×2 IMPLANT
POWDER SURGICEL 3.0 GRAM (HEMOSTASIS) IMPLANT
SUT MNCRL AB 4-0 PS2 18 (SUTURE) ×1 IMPLANT
SUT PROLENE 6 0 BV (SUTURE) ×1 IMPLANT
SUT SILK 2 0 SH (SUTURE) IMPLANT
SUT VIC AB 3-0 SH 27X BRD (SUTURE) ×1 IMPLANT
TOWEL GREEN STERILE (TOWEL DISPOSABLE) ×1 IMPLANT
UNDERPAD 30X36 HEAVY ABSORB (UNDERPADS AND DIAPERS) ×1 IMPLANT
WATER STERILE IRR 1000ML POUR (IV SOLUTION) ×1 IMPLANT

## 2023-04-26 NOTE — Discharge Instructions (Signed)
 [facility]  Discharge Instructions  AV Fistula or Graft Surgery for Dialysis Access  Please refer to the following instructions for your post-procedure care. Your surgeon or physician assistant will discuss any changes with you.  Activity  You may drive the day following your surgery, if you are comfortable and no longer taking prescription pain medication. Resume full activity as the soreness in your incision resolves.  Bathing/Showering  You may shower after you go home. Keep your incision dry for 48 hours. Do not soak in a bathtub, hot tub, or swim until the incision heals completely. You may not shower if you have a hemodialysis catheter.  Incision Care  Clean your incision with mild soap and water  after 48 hours. Pat the area dry with a clean towel. You do not need a bandage unless otherwise instructed. Do not apply any ointments or creams to your incision. You may have skin glue on your incision. Do not peel it off. It will come off on its own in about one week. Your arm may swell a bit after surgery. To reduce swelling use pillows to elevate your arm so it is above your heart. Your doctor will tell you if you need to lightly wrap your arm with an ACE bandage.  Diet  Resume your normal diet. There are not special food restrictions following this procedure. In order to heal from your surgery, it is CRITICAL to get adequate nutrition. Your body requires vitamins, minerals, and protein. Vegetables are the best source of vitamins and minerals. Vegetables also provide the perfect balance of protein. Processed food has little nutritional value, so try to avoid this.  Medications  Resume taking all of your medications. If your incision is causing pain, you may take over-the counter pain relievers such as acetaminophen  (Tylenol ). If you were prescribed a stronger pain medication, please be aware these medications can cause nausea and constipation. Prevent  nausea by taking the medication with a snack or meal. Avoid constipation by drinking plenty of fluids and eating foods with high amount of fiber, such as fruits, vegetables, and grains.  Do not take Tylenol  if you are taking prescription pain medications.  Follow up Your surgeon may want to see you in the office following your access surgery. If so, this will be arranged at the time of your surgery.  Please call us  immediately for any of the following conditions:  Increased pain, redness, drainage (pus) from your incision site Fever of 101 degrees or higher Severe or worsening pain at your incision site Hand pain or numbness.  Reduce your risk of vascular disease:  Stop smoking. If you would like help, call [facility] at 1-800-QUIT-NOW (234-531-5296) or [place] at (713)017-8785  Manage your cholesterol Maintain a desired weight Control your diabetes Keep your blood pressure down  Dialysis  It will take several weeks to several months for your new dialysis access to be ready for use. Your surgeon will determine when it is okay to use it. Your nephrologist will continue to direct your dialysis. You can continue to use your Permcath until your new access is ready for use.   04/26/2023 [name] [name] [id number] [birth year]  Surgeon(s): Sheree Penne Bruckner, MD  Procedure(s): RIGHT ARM SECOND STAGE BASILIC VEIN TRANSPOSITION   May stick graft immediately   May stick graft on designated area only:   x Do not stick fistula for 5 weeks    If you have any questions, please call the office at [phone].

## 2023-04-26 NOTE — Op Note (Signed)
    Patient name: [name] [name] MRN: [id number] DOB: 06-25-1985 Sex: male  04/26/2023 Pre-operative Diagnosis: Chronic kidney disease stage IV Post-operative diagnosis:  Same Surgeon:  Penne BROCKS. Sheree, MD Assistant: Sherrilee Holster, [place] Procedure Performed: Revision right arm basilic vein fistula with transposition  Indications: 38 year old male with advanced chronic kidney disease who has a history of basilic vein fistula creation of the right upper extremity which has matured well and he is now indicated for revision with transposition versus superficialization.  Given the complexity of the case,  the assistant was necessary in order to expedite the procedure and safely perform the technical aspects of the operation.  The assistant provided traction and countertraction to assist with exposure of the artery and vein.  They also assisted with suture ligation of multiple venous branches.  They played a critical role in the anastomosis. These skills, especially following the Prolene suture for the anastomosis, could not have been adequately performed by a scrub tech assistant.    Findings: There was a large fistula throughout the upper arm with patent anastomosis and very strong flow.  Fistula measured approximately millimeters in diameter.  After mobilizing the entire fistula this was transposed laterally on the arm and end-to-end anastomosis was created that had very strong thrill at completion and there was a strong radial artery signal that did augment with compression of the fistula.   Procedure:  The patient was identified in the holding area and taken to the operating room where his play supine operative when general anesthesia was induced.  He was sterilely prepped and draped in the right upper extremity in usual fashion, antibiotics were administered timeout was called.  Ultrasound was used to identify the fistula in the upper arm and 3 separate incisions were used to mobilize the entirety of the  fistula taking care to protect the nerve in the upper arm.  Branches were divided between ties.  When the entirety of the fistula had been mobilized back to the anastomosis we marked for orientation clamp to near the anastomosis transected and tunneled it laterally flushed with heparinized saline and spatulated both ends and send the Mannam with 6-0 Prolene suture.  Upon completion there was a very strong thrill throughout the fistula and a strong radial artery signal at the wrist the augmented with compression of the fistula.  We irrigated the wounds obtained stasis and closed in layers of Vicryl and Monocryl.  Patient was awakened from anesthesia having tolerated the procedure without any complication.  All counts were correct at completion.  EBL: 100 cc   Sandrika Schwinn C. Sheree, MD [facility] [place] Office: [phone] Pager: [phone]

## 2023-04-26 NOTE — H&P (Signed)
 HPI:  This is a 38 y.o. male who is s/p first stage right UE basilic fistula creation on 01/29/23 by Dr. Sheree.     Pt returns today for follow up.  Pt states he is not currently on HD , no anticoagulation therapy.  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  He denies changes in the right arm post surgery.       Allergies  No Known Allergies           Current Outpatient Medications  Medication Sig Dispense Refill   acetaminophen  (TYLENOL ) 500 MG tablet Take 1,000 mg by mouth every 6 (six) hours as needed for moderate pain.       amLODipine  (NORVASC ) 10 MG tablet TAKE ONE TABLET BY MOUTH ONCE DAILY (AM) 90 tablet 1   carvedilol  (COREG ) 25 MG tablet TAKE 1 TABLET BY MOUTH 2 (TWO) TIMES DAILY WITH A MEAL (AM+PM) 180 tablet 1   chlorthalidone (HYGROTON) 25 MG tablet Take 12.5 mg by mouth daily.       Continuous Glucose Sensor (DEXCOM G6 SENSOR) MISC USE DAILY AS DIRECTED. ONE SENSOR LAST FOR 10 DAYS. 3 each 3   Continuous Glucose Transmitter (DEXCOM G6 TRANSMITTER) MISC USE AS DIRECTED DAILY 1 each 0   famotidine  (PEPCID ) 20 MG tablet TAKE 1 TABLET (20 MG TOTAL) BY MOUTH AT BEDTIME. 90 tablet 3   FEROSUL 325 (65 Fe) MG tablet TAKE 1 TABLET (325 MG TOTAL) BY MOUTH DAILY WITH BREAKFAST. 100 tablet 1   gabapentin  (NEURONTIN ) 300 MG capsule Take 1 capsule (300 mg total) by mouth daily. 30 capsule 6   JARDIANCE  10 MG TABS tablet TAKE ONE TABLET BY MOUTH ON MONDAY,WEDNESDAY AND FRIDAY (AM). 12 tablet 2   oxyCODONE -acetaminophen  (PERCOCET) 5-325 MG tablet Take 1 tablet by mouth every 6 (six) hours as needed for severe pain (pain score 7-10). 10 tablet 0   pravastatin  (PRAVACHOL ) 20 MG tablet TAKE 1 TABLET (20 MG TOTAL) BY MOUTH DAILY. (BEDTIME) 90 tablet 1   sildenafil  (VIAGRA ) 100 MG tablet 1 tab PO 1/2 hr prior to intercourse PRN.  Limit use to 1 tab/24 hr 10 tablet 1   valsartan  (DIOVAN ) 40 MG tablet TAKE 1 TABLET (40 MG TOTAL) BY MOUTH DAILY. (AM) 90 tablet  1      No current facility-administered medications for this visit.         ROS:  See HPI   Physical Exam: Vitals:   04/26/23 0653  BP: 127/74  Pulse: 77  Resp: 20  Temp: 98.3 F (36.8 C)  SpO2: 98%   Incision:  well healed Extremities:  palpable thrill, palpable radial pulse Neuro: decreased motor and sensation in the right UE prior to surgery      Findings:  +--------------------+----------+-----------------+--------+  AVF                PSV (cm/s)Flow Vol (mL/min)Comments  +--------------------+----------+-----------------+--------+  Native artery inflow   245           700                 +--------------------+----------+-----------------+--------+  AVF Anastomosis        794                               +--------------------+----------+-----------------+--------+     +------------+----------+-------------+----------+--------+  OUTFLOW VEINPSV (cm/s)Diameter (cm)Depth (cm)Describe  +------------+----------+-------------+----------+--------+  Prox UA  129        0.62        2.75             +------------+----------+-------------+----------+--------+  Mid UA         196        0.73        1.82             +------------+----------+-------------+----------+--------+  Dist UA        180        0.74        1.38             +------------+----------+-------------+----------+--------+  AC Fossa     >875cm/s     0.34        1.32             +------------+----------+-------------+----------+--------+        Assessment/Plan:  This is a 38 y.o. male who is s/p:first stage basilic av fistula creation.  He will now be scheduled for second stage basilic transposition.  He is on t on HD and not on anticoagulation.  He agrees to proceed with this plan.       -plan for second stage basilic transposition today in the OR.   Deyanna Mctier C. Sheree, MD [facility] [place] Office: [phone] Pager:  [phone]

## 2023-04-26 NOTE — Transfer of Care (Signed)
 Immediate Anesthesia Transfer of Care Note  Patient: [name] [name]  Procedure(s) Performed: RIGHT ARM SECOND STAGE BASILIC VEIN TRANSPOSITION (Right: Arm Upper)  Patient Location: PACU  Anesthesia Type:General  Level of Consciousness: awake, alert , and oriented  Airway & Oxygen Therapy: Patient Spontanous Breathing  Post-op Assessment: Report given to RN  Post vital signs: Reviewed and stable  Last Vitals:  Vitals Value Taken Time  BP 121/60 04/26/23 1053  Temp    Pulse 66 04/26/23 1054  Resp 14 04/26/23 1054  SpO2 94 % 04/26/23 1054  Vitals shown include unfiled device data.  Last Pain:  Vitals:   04/26/23 0718  TempSrc:   PainSc: 0-No pain         Complications: No notable events documented.

## 2023-04-26 NOTE — Anesthesia Postprocedure Evaluation (Signed)
 Anesthesia Post Note  Patient: [name] [name]  Procedure(s) Performed: RIGHT ARM SECOND STAGE BASILIC VEIN TRANSPOSITION (Right: Arm Upper)     Patient location during evaluation: PACU Anesthesia Type: General Level of consciousness: awake and alert, patient cooperative and oriented Pain management: pain level controlled Vital Signs Assessment: post-procedure vital signs reviewed and stable Respiratory status: spontaneous breathing, nonlabored ventilation and respiratory function stable Cardiovascular status: blood pressure returned to baseline and stable Postop Assessment: no apparent nausea or vomiting and able to ambulate Anesthetic complications: no   No notable events documented.  Last Vitals:  Vitals:   04/26/23 1110 04/26/23 1125  BP: (!) 119/96 (!) 113/58  Pulse: 67 65  Resp: 15 11  Temp:    SpO2: 96% 97%    Last Pain:  Vitals:   04/26/23 1056  TempSrc:   PainSc: 5                  Dulcemaria Bula,E. Coby [place]

## 2023-04-26 NOTE — Anesthesia Procedure Notes (Signed)
 Procedure Name: LMA Insertion Date/Time: 04/26/2023 9:33 AM  Performed by: Crisoforo Burnard KIDD, CRNAPre-anesthesia Checklist: Patient identified, Emergency Drugs available, Suction available, Patient being monitored and Timeout performed Patient Re-evaluated:Patient Re-evaluated prior to induction Oxygen Delivery Method: Circle system utilized Preoxygenation: Pre-oxygenation with 100% oxygen Induction Type: IV induction LMA: LMA inserted LMA Size: 4.0 Number of attempts: 1 Placement Confirmation: positive ETCO2 and breath sounds checked- equal and bilateral Dental Injury: Teeth and Oropharynx as per pre-operative assessment

## 2023-04-27 ENCOUNTER — Encounter (HOSPITAL_COMMUNITY): Payer: Self-pay | Admitting: Vascular Surgery

## 2023-04-29 ENCOUNTER — Other Ambulatory Visit (HOSPITAL_COMMUNITY): Payer: Self-pay

## 2023-04-30 ENCOUNTER — Encounter (HOSPITAL_COMMUNITY): Payer: Self-pay

## 2023-04-30 ENCOUNTER — Encounter (HOSPITAL_COMMUNITY): Payer: Medicaid Other

## 2023-05-07 ENCOUNTER — Encounter (HOSPITAL_COMMUNITY): Payer: Medicaid Other

## 2023-05-10 ENCOUNTER — Encounter (HOSPITAL_COMMUNITY)
Admission: RE | Admit: 2023-05-10 | Discharge: 2023-05-10 | Disposition: A | Discharge status: Home / Self Care | Payer: Medicaid Other | Source: Ambulatory Visit | Attending: Internal Medicine | Admitting: Internal Medicine

## 2023-05-10 DIAGNOSIS — D631 Anemia in chronic kidney disease: Secondary | ICD-10-CM | POA: Insufficient documentation

## 2023-05-10 DIAGNOSIS — N189 Chronic kidney disease, unspecified: Secondary | ICD-10-CM | POA: Diagnosis present

## 2023-05-10 MED ORDER — FERUMOXYTOL INJECTION 510 MG/17 ML
510.0000 mg | INTRAVENOUS | Status: DC
  Administered 2023-05-10: 510 mg via INTRAVENOUS
  Filled 2023-05-10: qty 510

## 2023-05-15 ENCOUNTER — Ambulatory Visit (INDEPENDENT_AMBULATORY_CARE_PROVIDER_SITE_OTHER): Payer: Medicaid Other | Admitting: Physician Assistant

## 2023-05-15 VITALS — BP 157/99 | HR 70 | Temp 98.1°F | Resp 20 | Ht 67.0 in | Wt 243.5 lb

## 2023-05-15 DIAGNOSIS — N185 Chronic kidney disease, stage 5: Secondary | ICD-10-CM

## 2023-05-15 NOTE — Progress Notes (Signed)
    Postoperative Access Visit   History of Present Illness   [name] [name] is a 38 y.o. year old male who presents for postoperative follow-up for: right second stage basilic vein transposition (Date: 04/26/23).  The patient's wounds are healed.  The patient denies steal symptoms.  The patient is able to complete their activities of daily living.  He is not yet hemodialysis.  CKD is managed by Dr. Valentino Nose.  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   Physical Examination   Vitals:   05/15/23 1403  BP: (!) 157/99  Pulse: 70  Resp: 20  Temp: 98.1 F (36.7 C)  TempSrc: Temporal  SpO2: 97%  Weight: 243 lb 8 oz (110.5 kg)  Height: 5\' 7"  (1.702 m)   Body mass index is 38.14 kg/m.  right arm Incisions are healed, sensation in digits is intact, palpable thrill, bruit can be auscultated     Medical Decision Making   [name] [name] is a 38 y.o. year old male who presents s/p right second stage basilic vein transposition  Patent R arm basilic vein fistula without signs or symptoms of steal syndrome The patient's access will be ready for use 05/24/23 [name] [name] is moving to [place] on Saturday.  He will notify our office if he needs any medical records transferred. The patient may follow up on a prn basis   Emilie Rutter PA-C [facility] [place] Office: [phone]  Clinic MD: Randie Heinz

## 2023-05-17 ENCOUNTER — Encounter (HOSPITAL_COMMUNITY)
Admission: RE | Admit: 2023-05-17 | Discharge: 2023-05-17 | Disposition: A | Discharge status: Home / Self Care | Payer: Medicaid Other | Source: Ambulatory Visit | Attending: Internal Medicine | Admitting: Internal Medicine

## 2023-05-17 DIAGNOSIS — N189 Chronic kidney disease, unspecified: Secondary | ICD-10-CM | POA: Diagnosis not present

## 2023-05-17 LAB — LAB REPORT - SCANNED
Creatinine, POC: 56.9 mg/dL
EGFR: 20

## 2023-05-17 MED ORDER — SODIUM CHLORIDE 0.9 % IV SOLN
510.0000 mg | INTRAVENOUS | Status: DC
  Administered 2023-05-17: 510 mg via INTRAVENOUS
  Filled 2023-05-17: qty 510

## 2023-05-27 ENCOUNTER — Other Ambulatory Visit: Payer: Self-pay | Admitting: Internal Medicine

## 2023-05-27 DIAGNOSIS — I152 Hypertension secondary to endocrine disorders: Secondary | ICD-10-CM
